# Patient Record
Sex: Female | Born: 1945 | Race: White | Hispanic: No | Smoking: Former smoker
Health system: Southern US, Community
[De-identification: ages and names within clinical notes are randomized; demographics above are authoritative.]

## PROBLEM LIST (undated history)

## (undated) DIAGNOSIS — J189 Pneumonia, unspecified organism: Secondary | ICD-10-CM

## (undated) DIAGNOSIS — I10 Essential (primary) hypertension: Secondary | ICD-10-CM

## (undated) DIAGNOSIS — E119 Type 2 diabetes mellitus without complications: Secondary | ICD-10-CM

## (undated) DIAGNOSIS — J449 Chronic obstructive pulmonary disease, unspecified: Secondary | ICD-10-CM

## (undated) DIAGNOSIS — I2510 Atherosclerotic heart disease of native coronary artery without angina pectoris: Secondary | ICD-10-CM

## (undated) DIAGNOSIS — Z9981 Dependence on supplemental oxygen: Secondary | ICD-10-CM

## (undated) DIAGNOSIS — Z9289 Personal history of other medical treatment: Secondary | ICD-10-CM

## (undated) DIAGNOSIS — E7800 Pure hypercholesterolemia, unspecified: Secondary | ICD-10-CM

## (undated) DIAGNOSIS — D649 Anemia, unspecified: Secondary | ICD-10-CM

## (undated) DIAGNOSIS — R0602 Shortness of breath: Secondary | ICD-10-CM

## (undated) DIAGNOSIS — I219 Acute myocardial infarction, unspecified: Secondary | ICD-10-CM

## (undated) HISTORY — PX: SHOULDER ARTHROSCOPY W/ ROTATOR CUFF REPAIR: SHX2400

## (undated) HISTORY — PX: TONSILLECTOMY: SUR1361

---

## 1968-10-06 HISTORY — PX: ABDOMINAL HYSTERECTOMY: SHX81

## 1997-10-04 ENCOUNTER — Emergency Department (HOSPITAL_COMMUNITY): Admission: EM | Admit: 1997-10-04 | Discharge: 1997-10-04 | Payer: Self-pay | Admitting: Emergency Medicine

## 1998-02-18 ENCOUNTER — Encounter: Payer: Self-pay | Admitting: Cardiology

## 1998-02-18 ENCOUNTER — Ambulatory Visit (HOSPITAL_COMMUNITY): Admission: RE | Admit: 1998-02-18 | Discharge: 1998-02-18 | Payer: Self-pay | Admitting: Cardiology

## 1999-10-25 ENCOUNTER — Other Ambulatory Visit: Admission: RE | Admit: 1999-10-25 | Discharge: 1999-10-25 | Payer: Self-pay | Admitting: *Deleted

## 2002-03-12 ENCOUNTER — Encounter: Admission: RE | Admit: 2002-03-12 | Discharge: 2002-03-12 | Payer: Self-pay

## 2003-03-15 ENCOUNTER — Encounter (INDEPENDENT_AMBULATORY_CARE_PROVIDER_SITE_OTHER): Payer: Self-pay | Admitting: Specialist

## 2003-03-15 ENCOUNTER — Ambulatory Visit (HOSPITAL_COMMUNITY): Admission: RE | Admit: 2003-03-15 | Discharge: 2003-03-15 | Payer: Self-pay | Admitting: Gastroenterology

## 2003-05-31 ENCOUNTER — Ambulatory Visit (HOSPITAL_COMMUNITY): Admission: RE | Admit: 2003-05-31 | Discharge: 2003-05-31 | Payer: Self-pay | Admitting: Gastroenterology

## 2003-05-31 ENCOUNTER — Encounter (INDEPENDENT_AMBULATORY_CARE_PROVIDER_SITE_OTHER): Payer: Self-pay | Admitting: Specialist

## 2007-10-07 ENCOUNTER — Ambulatory Visit (HOSPITAL_COMMUNITY): Admission: RE | Admit: 2007-10-07 | Discharge: 2007-10-07 | Payer: Self-pay | Admitting: Emergency Medicine

## 2007-12-07 ENCOUNTER — Inpatient Hospital Stay (HOSPITAL_COMMUNITY): Admission: EM | Admit: 2007-12-07 | Discharge: 2007-12-09 | Payer: Self-pay | Admitting: Emergency Medicine

## 2007-12-07 ENCOUNTER — Ambulatory Visit: Payer: Self-pay | Admitting: Family Medicine

## 2007-12-07 HISTORY — PX: CORONARY ANGIOPLASTY WITH STENT PLACEMENT: SHX49

## 2008-01-16 ENCOUNTER — Encounter (INDEPENDENT_AMBULATORY_CARE_PROVIDER_SITE_OTHER): Payer: Self-pay | Admitting: Cardiovascular Disease

## 2008-01-16 ENCOUNTER — Ambulatory Visit (HOSPITAL_COMMUNITY): Admission: RE | Admit: 2008-01-16 | Discharge: 2008-01-16 | Payer: Self-pay | Admitting: Cardiovascular Disease

## 2010-02-26 ENCOUNTER — Encounter: Payer: Self-pay | Admitting: Neurosurgery

## 2010-06-20 NOTE — H&P (Signed)
Judith Boyer, Judith Boyer NO.:  [id number]   MEDICAL RECORD NO.:  [id number]          PATIENT TYPE:  EMS   LOCATION:  MAJO                         FACILITY:  [facility]   PHYSICIAN:  Santiago Bumpers. Hensel, M.D.DATE OF BIRTH:  [birth year]   DATE OF ADMISSION:  12/06/2007  DATE OF DISCHARGE:                              HISTORY & PHYSICAL   PRIMARY CARE PHYSICIAN:  Brett Canales A. Cleta Alberts, MD, of [place].   CHIEF COMPLAINT:  Chest pain with left arm pain.   HISTORY OF PRESENT ILLNESS:  This is a 65 year old white female with  history of hypertension and tobacco dependence and she comes in  complaining of left arm pain that is intermittent and occasional chest  cramping all brought on with activity.  She has no numbness or tingling  of the left arm.  She has occasional nausea, dyspnea, and near-syncopal  episode associated with the left arm pain and tingling.  She reports  having a normal Cardiolite stress test 9 years ago.  Does not have a  regular cardiologist at this time.   PAST MEDICAL HISTORY:  Significant for:  1. COPD.  2. Hypertension.  3. A Cardiolite in 2000, which was normal.   PAST SURGICAL HISTORY:  She has had a hysterectomy, tonsillectomy, and a  left shoulder arthroscopic surgery 14 years ago.   MEDICATIONS:  1. Hydrochlorothiazide 12.5 mg p.o. daily.  2. Advair 500/50 b.i.d.  3. Combivent p.r.n.   ALLERGIES:  DOXYCYCLINE, which her allergy is anaphylaxis and CODEINE,  which causes insomnia.   FAMILY HISTORY:  No significance for coronary artery disease.  No MI.  She did have a brother that died of brain cancer.   SOCIAL HISTORY:  She lives with her mom.  She is a Public librarian.  She smokes one-half pack per day for 30 years, which gives her a 15-pack-  year history of smoking.  She has no alcohol, no drugs, and denies any  exercise.   REVIEW OF SYSTEMS:  Please see HPI above.   PHYSICAL EXAMINATION:  VITAL SIGNS:  Temperature 98.1, blood pressure  170/71, her pulse was 93, her respirations are 18, and her oxygen was  98% on room air.  GENERAL:  She was well appearing, she had no acute distress.  She was  awake and alert x3, awake and oriented x3.  HEENT:  She has partial dentures.  Moist mucous membranes.  No JVD  and  no carotid bruits.  CARDIOVASCULAR:  She has a regular rate and rhythm.  No murmurs, rubs,  or gallops.  She has a distinct S1 and S2.  RESPIRATORY:  She is clear to auscultation bilaterally.  ABDOMEN:  Soft.  She is obese, nontender, nondistended, and has positive  bowel sounds.  EXTREMITIES:  No edema and 2+ dorsalis pedis pulses.  NEUROLOGIC:  Cranial nerves II through XII are grossly intact.  She is  5/5 in all extremities.   LABORATORY DATA:  She had a white blood cell count of 8.3, a hemoglobin  of 13.8, and platelets of 211.  Her point-of-care cardiac enzymes were  negative.  She had a sodium of  133, potassium of 3.7, chloride 101,  bicarb 27, BUN 18, and creatinine of 0.63.  Her blood glucose level was  308.  Her chest x-ray showed chronic appearing lung changes with no  infiltrate.  Her EKG showed normal sinus rhythm with no ST changes and  no T-wave changes.   ASSESSMENT AND PLAN:  This is a 65 year old white female with atypical  chest pain, admitted for rule out cardiac etiology.  1. Atypical chest pain.  The patient has 2 risk factors, first tobacco      and hypertension.  Therefore, we will admit to rule out and assess      her and to risk stratify by the patient.  EKG and point-of-care      enzymes are reassuring.  We will cycle the cardiac enzymes x3.      Repeat an EKG in the morning.  Check hemoglobin A1c and a fasting      lipid panel in the morning.  Provide smoking cessation counseling.      We will admit to telemetry floor and will likely need outpatient      Cardiolite test.  We will start on aspirin.  We will provide      morphine, oxygen, and nitroglycerin p.r.n.  2. She has an  increased blood glucose level.  The patient is assessed      for diabetes.  She needs a criteria for new diagnosis of diabetes.      We will benefit from starting that appointment during this      hospitalization.  3. Hypertension.  The patient's blood pressure will be controlled on      her home medication of hydrochlorothiazide of 12.5 mg p.o. daily.  4. Fluid, electrolytes, and nutrition.  The patient will be kept on a      heart-healthy diet.  5. Disposition.  This patient was admitted for 23-hour observation.      We will admit the patient to [facility] and a      telemetry bed.       Jamie Brookes, MD  Electronically Signed      Santiago Bumpers. Leveda Anna, M.D.  Electronically Signed    AS/MEDQ  D:  12/07/2007  T:  12/07/2007  Job:  [id number]

## 2010-06-20 NOTE — Discharge Summary (Signed)
[name], Judith Boyer NO.:  [id number]   MEDICAL RECORD NO.:  [id number]          PATIENT TYPE:  INP   LOCATION:  6526                         FACILITY:  [facility]   PHYSICIAN:  Santiago Bumpers. Hensel, M.D.DATE OF BIRTH:  [birth year]   DATE OF ADMISSION:  12/06/2007  DATE OF DISCHARGE:  12/09/2007                               DISCHARGE SUMMARY   PRIMARY CARE PHYSICIAN:  Judith Canales A. Cleta Alberts, MD   CARDIOLOGIST:  She is going to see Dr. Algie Coffer.   DISCHARGE DIAGNOSES:  1. Atypical chest pain with left arm pain status post catheterization      and stenting in right coronary artery.  2. Hypertension, well controlled on blood pressure medications.  3. Chronic obstructive pulmonary disease.  4. New diagnosis of diabetes type 2.  5. New diagnosis of hyperlipidemia.   DISCHARGE MEDICATIONS:  1. Advair 500/50 two puffs twice a day.  2. Combivent as needed.  3. Aspirin 81 mg by mouth daily.  4. Lisinopril 5 mg p.o. daily.  5. Nitroglycerin 0.4 mg placed under the tongue every 5 minutes up to      3 times as needed for chest pain.  6. Simvastatin 40 mg p.o. daily.  7. Metformin 500 mg p.o. b.i.d.  8. Plavix 75 mg p.o. daily.   The patient is instructed to not take the hydrochlorothiazide at this  time.  This will be left up to her PCP to restart this medication at the  time of her followup visit if he so wishes.   CONSULTATIONS:  Cardiology was consulted during this hospitalization.  Dr. Algie Coffer was consulted on December 08, 2007 and Dr. Sharyn Lull is the  physician who placed the stenting.   PROCEDURES:  On December 06, 2007, the patient had a chest x-ray done  that showed chronic-appearing lung changes, no infiltrates, edema, or  effusions.  The patient had a cardiac catheterization on December 08, 2007 that showed proximal 85% RCA stenosis and 50-60% mid stenosis with  haziness.  A PTCA was done to proximal and mid right coronary artery  using a balloon catheter and stent was  placed in and no lesions.  The  patient tolerated the procedures well.  The patient also had EKGs that  were done on December 06, 2007 and fairly normal ECG on December 07, 2007.  Another EKG was done, which showed no change and showed normal sinus  rhythm, normal ECG, early repolarization pattern, and diffuse ST  elevations, slightly more pronounced since the previous tracing on  December 06, 2007.  On December 08, 2007, she had a another EKG that  showed normal sinus rhythm.   LABORATORY DATA:  At the time of admission, the patient had a CBC done.  Her white blood cell count was 8.3, her hemoglobin 13.8, her hematocrit  was 41.4, and her platelet count was 211.  She also had a BMP that was  done with sodium of 133, potassium of 3.7, chloride 101, bicarb 27, BUN  of 18 and creatinine of 0.63 with glucose of 308.  Her point-of-care  enzymes on December 07, 2007, her troponin I was less  than 0.05.  By 3  hours after that blood draw, her troponin was 0.1 and 8 hours after that  her troponin I had gone to 0.15, and another 9 hours after that her  troponin was 0.22.  Cardiology was consulted as her troponin began to  increase.  She did have a lipid profile that showed cholesterol of 224,  triglycerides of 177, HDL 35, LDL 154, and a hemoglobin Z6X of 11.8.  On  the day of discharge, the patient had a CBC that was 9.4, hemoglobin  14.6, and platelets of 231.  She had also had a BMP with sodium of 139,  potassium of 4.3, chloride 104, bicarb 28, BUN 9, and creatinine 0.64  with a glucose of 193.   BRIEF HOSPITAL COURSE:  1. This is a 65 year old female that presented with a history of      hypertension and tobacco dependence.  She came in complaining of      left arm pain that was intermittent and occasionally some chest      cramping that was brought on with activity.  She had no numbness or      tingling, occasional nausea and dyspnea, near syncopal episode      associated with left arm pain  and tingling.  She had had a normal      Cardiolite stress test 9 years ago and did not see a cardiologist      regularly, so she was brought into the hospital and serial cardiac      enzymes were drawn on her.  The original cardiac enzyme was 0.05 or      less than 0.05, but as the serial cardiac enzyme levels came back,      her troponin did continue to go up.  So on December 07, 2007,      Cardiology was consulted and it was decided that the patient would      receive stenting.  The patient's heparin level was drawn and was      within the acceptable range to do the procedure on December 08, 2007.  The patient had a PT of 13.2 and an INR of 1.0.  The      procedure was done with Dr. Algie Coffer and the stent actually placed      by Dr. Sharyn Lull.  There was a stent placed in the right coronary      artery where the patient had approximately 90% of the vessel      stenosis at the proximal and then at the mid vessel 50% stenosis on      the vessel.  The patient had a normal left ventricular systolic      function with an ejection fraction of 70%.  Their conclusions were      severe right coronary artery disease, mild LAD, and LCX disease and      normal LV systolic function.  The patient did stay overnight after      the procedure.  She tolerated everything well.  She was discharged      on the day after the procedure and was in stable medical condition.  2. Hypertension.  Blood pressures were controlled on      hydrochlorothiazide while the patient was in the hospital.  This      was her home medication, however, we decided before her discharge      that she would be put on lisinopril 5 mg p.o. daily  and that      hydrochlorothiazide would be stopped.  This is due to the      protective effects of lisinopril and ACE inhibitors on the kidneys      in diabetics.  Her blood pressures remained stable during this      hospitalization.  3. COPD.  The patient is to continue her home meds of  Advair and      Combivent.  She had no difficulty breathing during this      hospitalization.  4. New diabetes diagnosis.  The patient came in with a blood glucose      of over 300.  This is enough to make the diagnosis of diabetes type      2.  The patient was continued on a sliding scale of NovoLog insulin      while she was in the hospital and was sent home with a prescription      for metformin 500 mg b.i.d.  5. New hyperlipidemia.  The patient came in with no known diagnosis of      hyperlipidemia and her cholesterol levels were drawn.  See above      for cholesterol levels.  They were high and the patient was put on      a statin.  She was sent home with a prescription for simvastatin 40      mg p.o. daily.  On December 09, 2007, the patient was stable and      ready for discharge.  Her medications were optimized.  See above      list for medications that the patient was sent home on.  She will      have a followup appointment with Dr. Ellis Parents partner, Dr. Perrin Maltese on      December 15, 2007 at 11:45 a.m.  She will also have a followup      appointment with Dr. Algie Coffer, the cardiologist on January 06, 2008      at 2 o'clock p.m.   DISCHARGE INSTRUCTIONS:  The patient was sent home with discharge  instructions.  She was advised to quit smoking and was given a sheet  with information as to reasons why she should come back including chest  pain, shortness of breath, dizziness with sweating or excessive  weakness.   DISCHARGE CONDITION:  The patient was discharged home in a stable  medical condition.      Jamie Brookes, MD  Electronically Signed      Santiago Bumpers. Leveda Anna, M.D.  Electronically Signed    AS/MEDQ  D:  12/09/2007  T:  12/10/2007  Job:  [id number]   cc:   Judith Boyer, M.D.  Ricki Rodriguez, M.D.

## 2010-06-20 NOTE — Cardiovascular Report (Signed)
NAMENATESHA, Judith Boyer NO.:  [id number]   MEDICAL RECORD NO.:  [id number]          PATIENT TYPE:  INP   LOCATION:  6526                         FACILITY:  [facility]   PHYSICIAN:  Mohan N. Sharyn Lull, M.D. DATE OF BIRTH:  1945-09-24   DATE OF PROCEDURE:  12/08/2007  DATE OF DISCHARGE:                            CARDIAC CATHETERIZATION   DATE. OF PROCEDURE:  December 08, 2007.   PROCEDURES:  1. Successful PTCA to proximal and mid RCA using 2.5 x 12 mm x 8 mm      long Voyager balloon.  2. Successful deployment of 3.0 x 33 mm long Cypher regulating stent      in mid and proximal RCA.  3. Successful postdilatation of Cypher drug-eluting stent using 3.5 x      15 mm long [place] Voyager balloon.   INDICATIONS FOR PROCEDURE:  [name] is 65 year old white female with  past medical history significant for hypertension, new onset diabetes  mellitus, tobacco use, and COPD, was admitted on December 06, 2007,  because of recurrent left arm pain and occasional left chest cramping  associated with minimal exertion.  She was admitted to telemetry unit,  also complains of occasional nausea and dyspnea, and near syncopal  episode associated with left arm pain and tingling.  She was admitted to  telemetry unit, MI was ruled out by serial enzymes and EKG.  The patient  subsequently underwent left cardiac cath as per Dr. Algie Coffer and was  noted to have proximal 85% RCA stenosis and 50-60% mid stenosis with  haziness, I was consulted for PCI to proximal and mid RCA.   INDICATIONS FOR PROCEDURE:  A 5-French arterial sheath was replaced with  6-French arterial sheath prior to PCI.  A 6-French left JR4 guiding  catheter with side holes was advanced over the wire under fluoroscopic  guidance up to the ascending aorta.  Wire was pulled out.  The catheter  was aspirated and connected to the manifold.  The catheter was further  advanced sending agent to right coronary ostium.  A single view of  RCA  was obtained.   FINDINGS:  As above.   INTERVENTIONAL PROCEDURE:  Successful PTCA to proximal and mid RCA was  done using 2.5 x 8 mm long Voyager balloon for predilatation and then  3.0 x 33 mm long Cypher drug-eluting stent was deployed in mid and  proximal RCA.  This stent was postdilated using 3.5 x 15 mm long [place]  Voyager balloon going up to 18 and 20 atmospheric pressure.  Lesion was  dilated from 60 and 85% to less than 10% residual with excellent TIMI  grade 3  distal flow without evidence of dissection or distal embolization.  The  patient received weight-based Angiomax and 600 mg of Plavix during the  procedure.  The patient tolerated the procedure well.  There were no  complications.  The patient was transferred to recovery room in stable  condition.      Eduardo Osier. Sharyn Lull, M.D.  Electronically Signed     MNH/MEDQ  D:  12/08/2007  T:  12/09/2007  Job:  [id number]  cc:   Ricki Rodriguez, M.D.  William A. Leveda Anna, M.D.

## 2010-06-20 NOTE — Discharge Summary (Signed)
NAMEDEVEN, AUDI NO.:  [id number]   MEDICAL RECORD NO.:  [id number]         PATIENT TYPE:  CINP   LOCATION:                               FACILITY:  [facility]   PHYSICIAN:  Santiago Bumpers. Hensel, M.D.DATE OF BIRTH:  04-17-1945   DATE OF ADMISSION:  12/07/2007  DATE OF DISCHARGE:  12/07/2007                               DISCHARGE SUMMARY   PRIMARY CARE PHYSICIAN:  Dr. Cleta Alberts in [place].   DISCHARGE DIAGNOSES:  1. Atypical chest pain/left arm pain.  2. Hypertension.  3. Chronic obstructive pulmonary disease.  4. New diagnosis of diabetes type 2.  5. New diagnosis of hyperlipidemia.   DISCHARGE MEDICATIONS:  1. Hydrochlorothiazide 12.5 mg p.o. daily.  2. Advair 500/50 two puffs b.i.d.  3. Combivent p.r.n.  4. Nitroglycerin 0.4 mg sublingual q.5 minutes x3 p.r.n. for chest      pain.  5. Aspirin 81 mg p.o. daily.  6. Lisinopril 5 mg p.o. daily.   ADMISSION LABORATORY DATA:  Upon admission, CBC was within normal  limits.  Point-of-care enzymes were negative.  EKG showed normal sinus  rhythm.  Chest x-ray showed chronic-appearing lung changes.  No  infiltrates, edema, or effusions.  Sodium was 133, potassium 3.7,  chloride 101, CO2 27, BUN 18, creatinine 0.63, and glucose of 308.  CBG  at 6:00 a.m. was 235 that was fasting.  Total cholesterol 224,  triglycerides 177, HDL 35, and LDL of 154.  Hemoglobin A1c is pending.  First set of cardiac enzymes troponin was 0.1, CK-MB of 2.7.  See  addendum for remainder of cardiac enzymes.  Repeat EKG showed normal  sinus rhythm.   BRIEF HOSPITAL COURSE:  This is a 62-year white female with a  presentation of atypical chest pain and left arm pain.  She was admitted  to [facility], placed on telemetry and  ruled out for MI.  Her labs were as stated above.  Point-of-care enzymes  were negative.  EKG was also negative.  Chest x-ray as stated above.  Her cardiac enzymes were cycled.  First  troponin was 0.1.  EKG performed  at that time showed normal sinus rhythm with no ST changes or T-wave  inversion.  The patient never complained of further symptoms of chest  discomfort or left arm pain.  The patient was risk stratified and was  found to have new diagnosis of diabetes with a random glucose of 308 and  a fasting glucose of 235.  The patient was educated about her diagnosis  and was instructed that she would have to undergo some lifestyle status  changes with her diet and exercise.  The patient was also instructed  that she would most likely have to be started on oral agents such as  metformin.  However, we will defer the management of this to Dr. Cleta Alberts,  as he may know the patient better and may want to start her on a  different medication per preference.  Hemoglobin A1c was obtained and is  pending at this time.  The patient was also risk stratified for  cholesterol and was found to  be hyperlipidemic.  As stated above, we did  not start a statin at this time.  We will defer to Dr. Cleta Alberts, as he may  want to choose which statin he may use.  I recommend aggressive therapy  given her risk factors.  I recommend rosuvastatin, as this is the most  potent of the statins.  The patient was educated about her risk.  She  was instructed to quit smoking.  She states that she smokes half-a-pack  per day for 30 years.  The patient was started on lisinopril during her  hospital stay for hypertensive reasons and also for kidney protection  for her new diagnosis of diabetes.  We recommend that she have a  cardiology consult as an outpatient set up by Dr. Cleta Alberts.  She states that  she had a Cardiolite done 9 years ago that was normal.  I recommend a  repeat Cardiolite or Myoview per the cardiologist's recommendations.  Given the fact that we started her on lisinopril during her hospital  stay, recommend rechecking a creatinine in 2-3 weeks.  Her creatinine  upon discharge was 0.63.  The patient  is in stable condition and desires  to go home and be worked up as an outpatient.   FOLLOWUP APPOINTMENTS:  The patient was instructed to call Dr. Cleta Alberts on  Monday following the day of discharge.  She seems reliable and is wanted  to do so.      Marisue Ivan, MD  Electronically Signed      Santiago Bumpers. Leveda Anna, M.D.  Electronically Signed    KL/MEDQ  D:  12/07/2007  T:  12/08/2007  Job:  [id number]   cc:   Dr. Cleta Alberts in [place]

## 2010-06-23 NOTE — Op Note (Signed)
Judith Boyer, Judith Boyer                     ACCOUNT NO.:  [id number]   MEDICAL RECORD NO.:  [id number]                   PATIENT TYPE:  AMB   LOCATION:  ENDO                                 [facility]ACILITY[facility]   PHYSICIAN:  John C. Madilyn Fireman, M.D.                 DATE OF BIRTH:  August 13, 1945   DATE OF PROCEDURE:  05/31/2003  DATE OF DISCHARGE:                                 OPERATIVE REPORT   PROCEDURE:  Colonoscopy.   INDICATION FOR PROCEDURE:  History of colon polyps removed with poor prep  and a probable incomplete removal of one ascending colon polyp approximately  2 months ago.   DESCRIPTION OF PROCEDURE:  The patient was placed in the left lateral  decubitus position and placed on the pulse monitor with continuous low-flow  oxygen delivered by nasal cannula.  She was sedated with 75 mcg IV fentanyl  and 7 mg IV Versed.  The Olympus video colonoscope was inserted into the  rectum and advanced to the cecum, confirmed by transillumination at  McBurney's point and visualization of the ileocecal valve and appendiceal  orifice.  The prep was excellent.  The cecum appeared normal with no masses,  polyps, diverticula, or other mucosal abnormalities.  Within the ascending  colon, there was a 1.2 cm polyp that was removed by snare.  The remainder of  the ascending, transverse, descending, and sigmoid colon appeared normal  with no further polyps, masses, diverticula, or other mucosal abnormalities.  Within the rectum, there were 3 less than 1 cm polyps ranging in size from 5  mm to 10 mm, and these were fulgurated by hot biopsy and sent in the same  specimen container with the ascending colon polyp.  The scope was then  withdrawn and the patient returned to the recovery room in stable condition.  She tolerated the procedure well, and there were no immediate complications.   IMPRESSION:  Ascending and rectal polyps.   PLAN:  Await histology to determine method and interval for future colon  screening.                                               John C. Madilyn Fireman, M.D.    [facility]/MEDQ  D:  05/31/2003  T:  05/31/2003  Job:  [id number]   cc:   Christella Noa, M.D.  [address] [place]  [place], [place] [address]  Fax: [phone]

## 2010-06-23 NOTE — Op Note (Signed)
Judith Boyer, Judith Boyer                     ACCOUNT NO.:  [id number]   MEDICAL RECORD NO.:  [id number]                   PATIENT TYPE:  AMB   LOCATION:  ENDO                                 FACILITY:  [facility]   PHYSICIAN:  John C. Madilyn Fireman, M.D.                 DATE OF BIRTH:  Aug 30, 1945   DATE OF PROCEDURE:  03/15/2003  DATE OF DISCHARGE:                                 OPERATIVE REPORT   PROCEDURE:  Colonoscopy with polypectomy.   INDICATIONS FOR PROCEDURE:  Multiple colon polyps in 1998, recommended for  repeat colonoscopy in two years but she was noncompliant and did not present  for a colonoscopy until now.   DESCRIPTION OF PROCEDURE:  The patient was placed in the left lateral  decubitus position and placed on the pulse monitor with continuous low-flow  oxygen delivered by nasal cannula.  She was sedated with 75 mcg IV fentanyl  and 8 mg IV Versed.  The Olympus video colonoscope was inserted into the  rectum and advanced to the cecum with considerable difficulty.  The prep was  suboptimal with significant film of brown stool on the walls of the colon in  several places.  They could  not be adequately lavaged to allow ideal  visualization and the prep was overall suboptimal.  The cecum appeared  normal with no masses, polyps or diverticula.  In the ascending colon there  were two polyps that were somewhat sessile, fairly large - on estimated  approximately 1.5 cm, the other one approximately 2-2.25 cm in diameter.  These were very difficult to ensnare for multiple reasons including  breathing artifact, difficulty in maintaining position and the location on  the edge of folds behind other folds making it difficult for me to maneuver  the snare into position to adequate ensnare them.  The smaller of the two  polyps was felt to be essentially ensnared in one piece, but there may have  been some residual polyp left behind at the base.  The second polyp was more  difficult and I was  able to ensnare two fragments of this polyp but could  never manipulate the snare into place to resnare the remaining base and  ultimately I aborted attempts to accomplish this.  The patient was also  somewhat difficult to keep sedated and was uncomfortable during this aspect  of the procedure.  I elected to leave these polyps alone and felt that she  would probably need a repeat colonoscopy in a few months anyway to reinspect  the bases presuming the removed polyps did not have invasive malignancy.  The transverse and descending colon appeared normal with no further polyps,  masses or diverticula although I could not rule out small polyps due to the  inadequate prep.  In the rectosigmoid area, there was one polyp  approximately 8 mm in diameter that was fulgurated by snare.  The remainder  of the sigmoid and rectum  appeared normal.  The scope was then withdrawn and  the patient returned to the recovery room in stable condition.  She  tolerated the procedure well and there were no immediate complications.   IMPRESSION:  1. Moderate to large ascending colon polyps, difficult to remove, with     possible remaining residual polyp.  2. Sigmoid polyp.  3. Could not rule out other polyps given the difficulty with this procedure     and poor preparation.   PLAN:  Will await histology.  Unless invasive malignancy warrants surgery,  will repeat colonoscopy in two months with a more vigorous prep and for  reinspection of the polypectomy sites.                                               John C. Madilyn Fireman, M.D.   JCH/MEDQ  D:  03/15/2003  T:  03/15/2003  Job:  [id number]

## 2010-11-07 LAB — CARDIAC PANEL(CRET KIN+CKTOT+MB+TROPI)
CK, MB: 3.3
Relative Index: INVALID
Relative Index: INVALID
Total CK: 57
Troponin I: 0.15 — ABNORMAL HIGH

## 2010-11-07 LAB — GLUCOSE, CAPILLARY
Glucose-Capillary: 163 — ABNORMAL HIGH
Glucose-Capillary: 270 — ABNORMAL HIGH
Glucose-Capillary: 293 — ABNORMAL HIGH
Glucose-Capillary: 295 — ABNORMAL HIGH

## 2010-11-07 LAB — BASIC METABOLIC PANEL
BUN: 9
Calcium: 9.1
Chloride: 101
Chloride: 104
Creatinine, Ser: 0.63
GFR calc Af Amer: >60
GFR calc non Af Amer: >60
Glucose, Bld: 193 — ABNORMAL HIGH
Potassium: 4.3

## 2010-11-07 LAB — CBC
HCT: 40.9
HCT: 43.1
Hemoglobin: 14.6
MCV: 89
MCV: 88.9
Platelets: 211
Platelets: 209
RBC: 4.65
RBC: 4.6
RDW: 12.6
WBC: 8.3
WBC: 8
WBC: 9.4

## 2010-11-07 LAB — POCT CARDIAC MARKERS
CKMB, poc: 1
Myoglobin, poc: 52.1
Troponin i, poc: <0.05

## 2010-11-07 LAB — LIPID PANEL
Cholesterol: 224 — ABNORMAL HIGH
HDL: 35 — ABNORMAL LOW
LDL Cholesterol: 154 — ABNORMAL HIGH
Total CHOL/HDL Ratio: 6.4
Triglycerides: 177 — ABNORMAL HIGH

## 2010-11-07 LAB — CK TOTAL AND CKMB (NOT AT ARMC)
CK, MB: 2.7
Relative Index: INVALID
Total CK: 54

## 2010-11-07 LAB — DIFFERENTIAL
Lymphocytes Relative: 37
Lymphs Abs: 3
Monocytes Relative: 5
Neutro Abs: 4.7
Neutrophils Relative %: 57

## 2010-11-07 LAB — HEPARIN LEVEL (UNFRACTIONATED): Heparin Unfractionated: <0.1 — ABNORMAL LOW

## 2010-11-07 LAB — PROTIME-INR: INR: 1

## 2010-11-07 LAB — TROPONIN I: Troponin I: 0.1 — ABNORMAL HIGH

## 2010-11-07 LAB — HEMOGLOBIN A1C: Mean Plasma Glucose: 292

## 2010-11-10 LAB — BASIC METABOLIC PANEL
CO2: 25 mEq/L (ref 19–32)
Glucose, Bld: 109 mg/dL — ABNORMAL HIGH (ref 70–99)
Potassium: 4.2 mEq/L (ref 3.5–5.1)
Sodium: 139 mEq/L (ref 135–145)

## 2010-11-10 LAB — PROTIME-INR: Prothrombin Time: 12.9 seconds (ref 11.6–15.2)

## 2010-11-10 LAB — DIFFERENTIAL
Basophils Absolute: 0 10*3/uL (ref 0.0–0.1)
Basophils Relative: 0 % (ref 0–1)
Eosinophils Relative: 0 % (ref 0–5)
Lymphocytes Relative: 30 % (ref 12–46)
Monocytes Absolute: 0.5 10*3/uL (ref 0.1–1.0)

## 2010-11-10 LAB — CBC
HCT: 40.8 % (ref 36.0–46.0)
Hemoglobin: 13.6 g/dL (ref 12.0–15.0)
MCHC: 33.5 g/dL (ref 30.0–36.0)
MCV: 89.2 fL (ref 78.0–100.0)
RDW: 12.7 % (ref 11.5–15.5)

## 2011-02-03 ENCOUNTER — Ambulatory Visit (INDEPENDENT_AMBULATORY_CARE_PROVIDER_SITE_OTHER): Payer: Medicare Other

## 2011-02-03 DIAGNOSIS — R0902 Hypoxemia: Secondary | ICD-10-CM

## 2011-02-03 DIAGNOSIS — J449 Chronic obstructive pulmonary disease, unspecified: Secondary | ICD-10-CM

## 2011-02-03 DIAGNOSIS — J159 Unspecified bacterial pneumonia: Secondary | ICD-10-CM

## 2011-02-04 ENCOUNTER — Inpatient Hospital Stay (HOSPITAL_COMMUNITY)
Admission: EM | Admit: 2011-02-04 | Discharge: 2011-02-09 | DRG: 192 | Disposition: A | Discharge status: Home / Self Care | Payer: Medicare Other | Attending: Family Medicine | Admitting: Family Medicine

## 2011-02-04 ENCOUNTER — Emergency Department (HOSPITAL_COMMUNITY): Payer: Medicare Other

## 2011-02-04 ENCOUNTER — Other Ambulatory Visit: Payer: Self-pay

## 2011-02-04 ENCOUNTER — Encounter: Payer: Self-pay | Admitting: Physical Medicine and Rehabilitation

## 2011-02-04 DIAGNOSIS — R0789 Other chest pain: Secondary | ICD-10-CM

## 2011-02-04 DIAGNOSIS — I2510 Atherosclerotic heart disease of native coronary artery without angina pectoris: Secondary | ICD-10-CM | POA: Diagnosis present

## 2011-02-04 DIAGNOSIS — J449 Chronic obstructive pulmonary disease, unspecified: Secondary | ICD-10-CM | POA: Diagnosis present

## 2011-02-04 DIAGNOSIS — J441 Chronic obstructive pulmonary disease with (acute) exacerbation: Secondary | ICD-10-CM

## 2011-02-04 DIAGNOSIS — F17200 Nicotine dependence, unspecified, uncomplicated: Secondary | ICD-10-CM | POA: Diagnosis present

## 2011-02-04 DIAGNOSIS — Z7902 Long term (current) use of antithrombotics/antiplatelets: Secondary | ICD-10-CM

## 2011-02-04 DIAGNOSIS — Z9861 Coronary angioplasty status: Secondary | ICD-10-CM

## 2011-02-04 DIAGNOSIS — Z888 Allergy status to other drugs, medicaments and biological substances status: Secondary | ICD-10-CM

## 2011-02-04 DIAGNOSIS — I10 Essential (primary) hypertension: Secondary | ICD-10-CM | POA: Diagnosis present

## 2011-02-04 DIAGNOSIS — E119 Type 2 diabetes mellitus without complications: Secondary | ICD-10-CM | POA: Diagnosis present

## 2011-02-04 DIAGNOSIS — Z886 Allergy status to analgesic agent status: Secondary | ICD-10-CM

## 2011-02-04 DIAGNOSIS — J329 Chronic sinusitis, unspecified: Secondary | ICD-10-CM | POA: Diagnosis present

## 2011-02-04 DIAGNOSIS — R079 Chest pain, unspecified: Secondary | ICD-10-CM | POA: Diagnosis present

## 2011-02-04 DIAGNOSIS — I252 Old myocardial infarction: Secondary | ICD-10-CM

## 2011-02-04 HISTORY — DX: Chronic obstructive pulmonary disease, unspecified: J44.9

## 2011-02-04 HISTORY — DX: Essential (primary) hypertension: I10

## 2011-02-04 HISTORY — DX: Acute myocardial infarction, unspecified: I21.9

## 2011-02-04 LAB — POCT I-STAT 3, ART BLOOD GAS (G3+)
Acid-base deficit: 1 mmol/L (ref 0.0–2.0)
Bicarbonate: 26.4 mEq/L — ABNORMAL HIGH (ref 20.0–24.0)
TCO2: 28 mmol/L (ref 0–100)
pCO2 arterial: 52.1 mmHg — ABNORMAL HIGH (ref 35.0–45.0)
pH, Arterial: 7.313 — ABNORMAL LOW (ref 7.350–7.400)

## 2011-02-04 LAB — CBC
HCT: 36.6 % (ref 36.0–46.0)
Hemoglobin: 12 g/dL (ref 12.0–15.0)
MCHC: 32.8 g/dL (ref 30.0–36.0)
MCV: 86.5 fL (ref 78.0–100.0)
RDW: 13.7 % (ref 11.5–15.5)

## 2011-02-04 LAB — POCT I-STAT TROPONIN I: Troponin i, poc: 0.01 ng/mL (ref 0.00–0.08)

## 2011-02-04 LAB — PRO B NATRIURETIC PEPTIDE: Pro B Natriuretic peptide (BNP): 172.2 pg/mL — ABNORMAL HIGH (ref 0–125)

## 2011-02-04 LAB — COMPREHENSIVE METABOLIC PANEL
Alkaline Phosphatase: 83 U/L (ref 39–117)
BUN: 16 mg/dL (ref 6–23)
Creatinine, Ser: 0.49 mg/dL — ABNORMAL LOW (ref 0.50–1.10)
GFR calc Af Amer: >90 mL/min (ref >90)
Glucose, Bld: 142 mg/dL — ABNORMAL HIGH (ref 70–99)
Potassium: 3.9 mEq/L (ref 3.5–5.1)
Total Bilirubin: 0.1 mg/dL — ABNORMAL LOW (ref 0.3–1.2)
Total Protein: 6.5 g/dL (ref 6.0–8.3)

## 2011-02-04 LAB — GLUCOSE, CAPILLARY: Glucose-Capillary: 305 mg/dL — ABNORMAL HIGH (ref 70–99)

## 2011-02-04 LAB — CARDIAC PANEL(CRET KIN+CKTOT+MB+TROPI): Total CK: 138 U/L (ref 7–177)

## 2011-02-04 MED ORDER — ALBUTEROL SULFATE (5 MG/ML) 0.5% IN NEBU
5.0000 mg | INHALATION_SOLUTION | Freq: Once | RESPIRATORY_TRACT | Status: AC
  Administered 2011-02-04: 5 mg via RESPIRATORY_TRACT
  Filled 2011-02-04: qty 1

## 2011-02-04 MED ORDER — LORAZEPAM 1 MG PO TABS
0.5000 mg | ORAL_TABLET | Freq: Once | ORAL | Status: AC
  Administered 2011-02-04: 0.5 mg via ORAL
  Filled 2011-02-04: qty 1

## 2011-02-04 MED ORDER — ALBUTEROL SULFATE (5 MG/ML) 0.5% IN NEBU
2.5000 mg | INHALATION_SOLUTION | RESPIRATORY_TRACT | Status: DC | PRN
  Administered 2011-02-04 – 2011-02-05 (×4): 2.5 mg via RESPIRATORY_TRACT
  Filled 2011-02-04 (×3): qty 0.5

## 2011-02-04 MED ORDER — PREDNISONE 50 MG PO TABS
50.0000 mg | ORAL_TABLET | Freq: Every day | ORAL | Status: DC
  Administered 2011-02-05 – 2011-02-09 (×5): 50 mg via ORAL
  Filled 2011-02-04 (×6): qty 1

## 2011-02-04 MED ORDER — LOSARTAN POTASSIUM 50 MG PO TABS
50.0000 mg | ORAL_TABLET | Freq: Every day | ORAL | Status: DC
  Administered 2011-02-04 – 2011-02-09 (×6): 50 mg via ORAL
  Filled 2011-02-04 (×6): qty 1

## 2011-02-04 MED ORDER — IPRATROPIUM BROMIDE 0.02 % IN SOLN
0.5000 mg | Freq: Four times a day (QID) | RESPIRATORY_TRACT | Status: DC
  Administered 2011-02-04 – 2011-02-09 (×19): 0.5 mg via RESPIRATORY_TRACT
  Filled 2011-02-04 (×21): qty 2.5

## 2011-02-04 MED ORDER — ALBUTEROL SULFATE (5 MG/ML) 0.5% IN NEBU
2.5000 mg | INHALATION_SOLUTION | RESPIRATORY_TRACT | Status: DC
  Administered 2011-02-04 – 2011-02-05 (×2): 2.5 mg via RESPIRATORY_TRACT
  Filled 2011-02-04 (×3): qty 0.5

## 2011-02-04 MED ORDER — IOHEXOL 300 MG/ML  SOLN: 100.0000 mL | Freq: Once | INTRAMUSCULAR | Status: AC | PRN

## 2011-02-04 MED ORDER — CLOPIDOGREL BISULFATE 75 MG PO TABS
75.0000 mg | ORAL_TABLET | Freq: Every day | ORAL | Status: DC
  Administered 2011-02-04 – 2011-02-09 (×6): 75 mg via ORAL
  Filled 2011-02-04 (×6): qty 1

## 2011-02-04 MED ORDER — SODIUM CHLORIDE 0.9 % IJ SOLN
3.0000 mL | INTRAMUSCULAR | Status: DC | PRN
  Administered 2011-02-07 (×2): 3 mL via INTRAVENOUS

## 2011-02-04 MED ORDER — MONTELUKAST SODIUM 10 MG PO TABS
10.0000 mg | ORAL_TABLET | Freq: Every day | ORAL | Status: DC
  Administered 2011-02-04 – 2011-02-08 (×5): 10 mg via ORAL
  Filled 2011-02-04 (×6): qty 1

## 2011-02-04 MED ORDER — SODIUM CHLORIDE 0.9 % IV BOLUS (SEPSIS)
500.0000 mL | Freq: Once | INTRAVENOUS | Status: AC
  Administered 2011-02-04: 500 mL via INTRAVENOUS

## 2011-02-04 MED ORDER — SODIUM CHLORIDE 0.9 % IV SOLN: 250.0000 mL | INTRAVENOUS | Status: DC | PRN

## 2011-02-04 MED ORDER — HEPARIN SODIUM (PORCINE) 5000 UNIT/ML IJ SOLN
5000.0000 [IU] | Freq: Three times a day (TID) | INTRAMUSCULAR | Status: DC
  Administered 2011-02-04 – 2011-02-09 (×14): 5000 [IU] via SUBCUTANEOUS
  Filled 2011-02-04 (×17): qty 1

## 2011-02-04 MED ORDER — SODIUM CHLORIDE 0.9 % IJ SOLN
3.0000 mL | Freq: Two times a day (BID) | INTRAMUSCULAR | Status: DC
  Administered 2011-02-04 – 2011-02-08 (×8): 3 mL via INTRAVENOUS

## 2011-02-04 MED ORDER — MOXIFLOXACIN HCL 400 MG PO TABS
400.0000 mg | ORAL_TABLET | Freq: Every day | ORAL | Status: DC
  Administered 2011-02-05 – 2011-02-09 (×5): 400 mg via ORAL
  Filled 2011-02-04 (×5): qty 1

## 2011-02-04 MED ORDER — FLUTICASONE-SALMETEROL 500-50 MCG/DOSE IN AEPB
1.0000 | INHALATION_SPRAY | Freq: Two times a day (BID) | RESPIRATORY_TRACT | Status: DC
  Administered 2011-02-04 – 2011-02-08 (×9): 1 via RESPIRATORY_TRACT
  Filled 2011-02-04: qty 14

## 2011-02-04 MED ORDER — IPRATROPIUM BROMIDE 0.02 % IN SOLN
0.5000 mg | Freq: Once | RESPIRATORY_TRACT | Status: AC
  Administered 2011-02-04: 0.5 mg via RESPIRATORY_TRACT
  Filled 2011-02-04: qty 2.5

## 2011-02-04 MED ORDER — INSULIN ASPART 100 UNIT/ML ~~LOC~~ SOLN
0.0000 [IU] | Freq: Three times a day (TID) | SUBCUTANEOUS | Status: DC
  Administered 2011-02-05: 3 [IU] via SUBCUTANEOUS
  Administered 2011-02-05: 5 [IU] via SUBCUTANEOUS
  Administered 2011-02-06: 1 [IU] via SUBCUTANEOUS
  Administered 2011-02-06 (×2): 3 [IU] via SUBCUTANEOUS
  Administered 2011-02-07: 2 [IU] via SUBCUTANEOUS
  Administered 2011-02-07: 5 [IU] via SUBCUTANEOUS
  Administered 2011-02-08: 2 [IU] via SUBCUTANEOUS
  Administered 2011-02-08: 1 [IU] via SUBCUTANEOUS
  Administered 2011-02-08 – 2011-02-09 (×3): 3 [IU] via SUBCUTANEOUS
  Filled 2011-02-04: qty 3

## 2011-02-04 NOTE — ED Notes (Signed)
Pt ambulating in hall on room air, O2 sat 79% with pulse rate 140, pt unable to tolerate activity, pt placed in wheelchair with 4L O2 reapplied, pt O2 sat returned to 93 with pulse rate of 125, pt in room on cardiac monitor 4 L [place]

## 2011-02-04 NOTE — H&P (Signed)
  I interviewed and examined this patient and discussed the care plan with Dr. Aviva Signs and the [facility] team and agree with assessment and plan as documented in the admission note for today.    Diala Waxman A. Sheffield Slider, MD Family Medicine Teaching Service Attending  02/04/2011 9:56 PM

## 2011-02-04 NOTE — ED Notes (Signed)
IV in L hand painful & unable to flush, IV removed

## 2011-02-04 NOTE — ED Notes (Signed)
Respiratory paged to obtain ABG.

## 2011-02-04 NOTE — ED Notes (Signed)
Pt up to bedside commode, became SOB on exertion. Pt assisted back to bed. Work of breathing increased. HR 120bpm. Pt remains on cardiac monitor. Dr. Alto Denver at the bedside.

## 2011-02-04 NOTE — ED Notes (Signed)
Pt presents to department via EMS from home for evaluation of SOB and diffuse chest heaviness. Seen at PCP yesterday and diagnosed with pneumonia. Onset of chest pain today. Pt states "this feels like my last heart attack." received 1 sublingual nitroglycerin and 324 ASA per EMS. No chest pain upon arrival. 20g L hand. She is alert and oriented x4. No signs of distress at the present.

## 2011-02-04 NOTE — ED Provider Notes (Signed)
History     CSN: [id number]  Arrival date & time 02/04/11  1208   First MD Initiated Contact with Patient 02/04/11 1215      Chief Complaint  Patient presents with  . Shortness of Breath  . Chest Pain    (Consider location/radiation/quality/duration/timing/severity/associated sxs/prior treatment) Patient is a 65 y.o. female presenting with chest pain.  Chest Pain Primary symptoms include a fever, fatigue, shortness of breath, cough, wheezing and nausea.    Patient is a 65 year old female who presents today with shortness of breath. Patient has had increasing shortness of breath and cough since December 26. She actually saw her primary care provider Dr. dogs yesterday. At that time she had a chest x-ray and was diagnosed with pneumonia. She was started on Avelox. She was given IV fluids in his office and was on oxygen there for a time. She is normally not on home oxygen. She was felt safe to discharge home. She was also started on prednisone. This morning she has taken her Avelox and her prednisone. She had sudden onset of a substernal chest pain that was severe. This resolved with one dose of nitroglycerin. Patient had had some associated nausea with this. She did receive a full dose of aspirin before arrival by EMS. Patient has history of coronary artery disease with 2 stents placed during her last catheterization in 2009. Dr. Algie Coffer is her cardiologist.  Patient is speaking in 2-3 word bursts and is to keep neck on arrival. She has not been on prednisone in the past 6 months. She has not been admitted to the hospital in the past month. She has no known sick contacts. Patient did have 2 days of fever when her symptoms first began but she has not had one since that time the There are no other associated or modifying factors. Past Medical History  Diagnosis Date  . Myocardial infarction   . COPD (chronic obstructive pulmonary disease)   . Hypertension     History reviewed. No pertinent  past surgical history.  History reviewed. No pertinent family history.  History  Substance Use Topics  . Smoking status: Never Smoker   . Smokeless tobacco: Not on file  . Alcohol Use: No    OB History    Grav Para Term Preterm Abortions TAB SAB Ect Mult Living                  Review of Systems  Constitutional: Positive for fever and fatigue.  HENT: Positive for congestion and sinus pressure.   Respiratory: Positive for cough, chest tightness, shortness of breath and wheezing.   Cardiovascular: Positive for chest pain.  Gastrointestinal: Positive for nausea.  Genitourinary: Negative.   Musculoskeletal: Negative.   Skin: Negative.   Neurological: Negative.   Hematological: Negative.   Psychiatric/Behavioral: Negative.   All other systems reviewed and are negative.    Allergies  Codeine; Doxycycline; and Lisinopril  Home Medications  No current outpatient prescriptions on file.  There were no vitals taken for this visit.  Physical Exam  Nursing note and vitals reviewed. Constitutional: She is oriented to person, place, and time. She appears well-developed and well-nourished.  HENT:  Head: Normocephalic and atraumatic.  Eyes: Conjunctivae and EOM are normal. Pupils are equal, round, and reactive to light.  Neck: Normal range of motion.  Cardiovascular: Regular rhythm, normal heart sounds and intact distal pulses.  Tachycardia present.  Exam reveals no gallop and no friction rub.   No murmur heard. Pulmonary/Chest: Tachypnea noted.  She is in respiratory distress. She has decreased breath sounds. She has wheezes.  Abdominal: Soft. Bowel sounds are normal. She exhibits no distension. There is no tenderness. There is no rebound and no guarding.  Musculoskeletal: Normal range of motion. She exhibits no edema.  Neurological: She is alert and oriented to person, place, and time. No cranial nerve deficit.  Skin: Skin is warm and dry. No rash noted. There is pallor.    Psychiatric: She has a normal mood and affect.    ED Course  Procedures (including critical care time)  Date: 02/04/2011  Rate: 109  Rhythm: sinus tachycardia  QRS Axis: normal  Intervals: normal  ST/T Wave abnormalities: normal  Conduction Disutrbances:none  Narrative Interpretation: prior inferior Q waves noted  Old EKG Reviewed: unchanged  Labs Reviewed  PRO B NATRIURETIC PEPTIDE - Abnormal; Notable for the following:    Pro B Natriuretic peptide (BNP) 172.2 (*)    All other components within normal limits  CBC - Abnormal; Notable for the following:    WBC 10.7 (*)    All other components within normal limits  COMPREHENSIVE METABOLIC PANEL - Abnormal; Notable for the following:    Chloride 95 (*)    Glucose, Bld 142 (*)    Creatinine, Ser 0.49 (*)    Albumin 3.4 (*)    Total Bilirubin 0.1 (*)    All other components within normal limits  POCT I-STAT TROPONIN I  POCT I-STAT TROPONIN I  I-STAT TROPONIN I  I-STAT TROPONIN I   Ct Angio Chest W/cm &/or Wo Cm  02/04/2011  *RADIOLOGY REPORT*  Clinical Data:  Chest pain with shortness of breath.  Evaluate for pulmonary embolism.  History of diabetes, hypertension, COPD and myocardial infarction.  CT ANGIOGRAPHY CHEST WITH CONTRAST  Technique:  Multidetector CT imaging of the chest was performed using the standard protocol during bolus administration of intravenous contrast.  Multiplanar CT image reconstructions including MIPs were obtained to evaluate the vascular anatomy.  Contrast:  100 ml Omnipaque-300 intravenously.  Comparison:  Chest radiographs same date.  No prior CT.  Findings:  The pulmonary arteries are well opacified with contrast. There is no evidence of acute pulmonary embolism.  There is atherosclerosis of the great vessels, aorta and coronary arteries.  Scattered normal-sized hilar and mediastinal lymph nodes are noted. There are no pathologically enlarged lymph nodes.  There is no pleural or pericardial effusion.   Moderate emphysematous changes are present throughout the lungs. No airspace disease, suspicious nodule or endobronchial lesion is identified.  The visualized upper abdomen demonstrates no acute or significant findings.  Review of the MIP images confirms the above findings.  IMPRESSION:  1.  No evidence of acute pulmonary embolism other acute chest process. 2.  Moderate emphysema. 3.  Scattered atherosclerosis.  Original Report Authenticated By: Gerrianne Scale, M.D.   Dg Chest Portable 1 View  02/04/2011  *RADIOLOGY REPORT*  Clinical Data: Shortness of breath, chest pain  PORTABLE CHEST - 1 VIEW  Comparison: 12/06/2007  Findings: Cardiomediastinal silhouette is unremarkable. Stable hyperinflation and chronic mild interstitial prominence.  Stable streaky atelectasis or scarring in the right middle lobe.  No convincing pulmonary edema.  No focal infiltrate.  IMPRESSION:  Stable hyperinflation and chronic mild interstitial prominence. Stable streaky atelectasis or scarring in the right middle lobe. No convincing pulmonary edema.  No focal infiltrate.  Original Report Authenticated By: Natasha Mead, M.D.    Date: 02/04/2011  Rate: 109  Rhythm: sinus tachycardia  QRS Axis: normal  Intervals: normal  ST/T Wave abnormalities: nonspecific T wave changes  Conduction Disutrbances:none  Narrative Interpretation: T-wave flattening seen in aVL previously now appears to be slightly inverted. Patient is tachycardic compared to previous. No significant change.  Old EKG Reviewed: unchanged    1. COPD exacerbation       MDM  Patient is a 65 yo female with history of CAD, COPD, and diagnosis of pneumonia yesterday.  Patient has a hearty been started on Avelox for her symptoms and was begun on prednisone as well. She taken both of these medications this morning. She then became acutely more short of breath and experienced chest pain this morning. This resolved with 1 nitroglycerin tab. Though her chest pain  resolved her shortness of breath continues. She had diminished breath sounds throughout on her exam. Prednisone as ordered as the severity being given. Albuterol and Atrovent nebs were ordered. Patient hearty received aspirin prior to arrival. Cardiac workup was initiated as well given patient's history of CAD with 2 stents in the past.  Patient had 2 negative troponins as well as an unchanged EKG. Her chest x-ray today did not show any signs of pneumonia. Patient did not receive significant relief with albuterol and Atrovent nebulizers here. Patient was given a 500 cc normal saline IV bolus as well as 0.5 mg of Ativan without resolution of her symptoms as well. CT angiogram evaluate for PE was performed and was negative. Patient's heart rate improved to the 1 teens but she continued to be short of breath when moving. She was comfortable on 2 L per nasal cannula while seated. She had no oxygen desaturations while at rest. Upon ambulation but the patient did drop her sats are 78% on room air and also was tachycardic to the 150s. This resolved when she was replaced on her oxygen. Patient normally would have no difficulty with this.  Given this change in the patient's baseline condition she will be admitted to family practice for COPD exacerbation.        Cyndra Numbers, MD 02/04/11 [phone]

## 2011-02-04 NOTE — H&P (Addendum)
Family Medicine Teaching [facility] Admission History and Physical  Patient name: Judith Boyer Medical record number: [id number] Date of birth: December 21, 1945 Age: 65 y.o. Gender: female  Primary Care Provider: No primary provider on file.  Chief Complaint: Difficulty breathing History of Present Illness: Judith Boyer is a 65 y.o. female presenting with SOB since last Wednesday that has been worsening. Started with an upper respiratory symptoms like a "sinusitis" this is d the way described by the patient. With increasing rhinorrhea, and soreness on her face. Had also fever and body ache. Went yesterday to [facility] and was diagnosed with PNA started on Avelox, Prednisone 60 mg and inhalers but pt today after taking her medications started to increase her SOB and felt pain like pressure on her chest. She had in 2009 a heart event with stents placed and refers to thi event of today as almost the same pressure- like but no as intense and no irradiating to her left arm. EMS came to rescue and gave he ASA + nitroglycerine X1 and the pain was relived. Came to ED and a full work up was done with CXR, CAT, CE, EKG and labs only finding positive her hypoxia at rest. It is decided to admitted to [facility]MTS.  There is no problem list on file for this patient.  Past Medical History: Past Medical History  Diagnosis Date  . Myocardial infarction   . COPD (chronic obstructive pulmonary disease)   . Hypertension    Past Surgical History: Past Surgical History  Procedure Date  . Abdominal hysterectomy   . Shoulder surgery   . Coronary stent placement    Social History: History   Social History  . Marital Status: Widowed    Spouse Name: N/A    Number of Children: N/A  . Years of Education: N/A   Social History Main Topics  . Smoking status: Current Everyday Smoker -- 1.0 packs/day  . Smokeless tobacco: None  . Alcohol Use: No  . Drug Use: No  . Sexually Active:    Other  Topics Concern  . None   Social History Narrative  . None   Family History: History reviewed. No pertinent family history. Allergies: Allergies  Allergen Reactions  . Codeine   . Doxycycline   . Lisinopril    Medication Dose Route Frequency  . 0.9 %  sodium chloride infusion  250 mL Intravenous PRN  . albuterol (PROVENTIL) (5 MG/ML) 0.5% nebulizer solution 2.5 mg  2.5 mg Nebulization Q4H  . albuterol (PROVENTIL) (5 MG/ML) 0.5% nebulizer solution 2.5 mg  2.5 mg Nebulization Q2H PRN  . albuterol (PROVENTIL) (5 MG/ML) 0.5% nebulizer solution 5 mg  5 mg Nebulization Once  . albuterol (PROVENTIL) (5 MG/ML) 0.5% nebulizer solution 5 mg  5 mg Nebulization Once  . clopidogrel (PLAVIX) tablet 75 mg  75 mg Oral Daily  . Fluticasone-Salmeterol (ADVAIR) 500-50 MCG/DOSE inhaler 1 puff  1 puff Inhalation Q12H  . heparin injection 5,000 Units  5,000 Units Subcutaneous Q8H  . iohexol (OMNIPAQUE) 300 MG/ML solution 100 mL  100 mL Intravenous Once PRN  . ipratropium (ATROVENT) nebulizer solution 0.5 mg  0.5 mg Nebulization Once  . ipratropium (ATROVENT) nebulizer solution 0.5 mg  0.5 mg Nebulization Q6H  . LORazepam (ATIVAN) tablet 0.5 mg  0.5 mg Oral Once  . losartan (COZAAR) tablet 50 mg  50 mg Oral Daily  . montelukast (SINGULAIR) tablet 10 mg  10 mg Oral QHS  . moxifloxacin (AVELOX) tablet 400 mg  400 mg Oral q1800  . predniSONE (DELTASONE) tablet 50 mg  50 mg Oral Q breakfast  . sodium chloride 0.9 % bolus 500 mL  500 mL Intravenous Once  . sodium chloride 0.9 % injection 3 mL  3 mL Intravenous Q12H  . sodium chloride 0.9 % injection 3 mL  3 mL Intravenous PRN    Review Of Systems: Per HPI  Otherwise 12 point review of systems was performed and was unremarkable.  Physical Exam: Filed Vitals:   02/04/11 1851  BP: 162/80  Pulse: 117  Temp: 97 F (36.1 C)  Resp: 25   Gen:  Moderated distress due to dyspnea. HEENT: Moist mucous membranes CV: Regular rate and rhythm, no murmurs rubs  or gallops PULM: Breath sound diminished bilaterally. Wheezes presents more on the left than on the right lung field. ABD: Soft, non tender, non distended, normal bowel sounds EXT: No edema Neuro: Alert and oriented x3. No focalization   Labs and Imaging: Lab Results  Component Value Date/Time   NA 136 02/04/2011 12:46 PM   K 3.9 02/04/2011 12:46 PM   CL 95* 02/04/2011 12:46 PM   CO2 29 02/04/2011 12:46 PM   BUN 16 02/04/2011 12:46 PM   CREATININE 0.49* 02/04/2011 12:46 PM   GLUCOSE 142* 02/04/2011 12:46 PM   Lab Results  Component Value Date   WBC 10.7* 02/04/2011   HGB 12.0 02/04/2011   HCT 36.6 02/04/2011   MCV 86.5 02/04/2011   PLT 281 02/04/2011    Assessment and Plan: Judith Boyer is a 65 y.o.  female admitted for dyspnea and hypoxia. 1. Respiratory: She has HX of COPD is a current smoker. This corresponds most likely with an episode of viral illness exacerbating her COPD. -Continue Prednisone, albuterol Q4 scheduled and Q2 PRN. Avelox since abx can reduce lengh in COPD exacerbations. O2 [place] and Close monitoring of hypoxia. 2. Cardiovascular: Hx of MI with stents in 2009. - Continue antiplatelet regimen. Telemetry unit, will cycle CE, EKG, lipid profile. 3. HTN : continue home meds and monitor. 4. DM -A1C and  SSI. Metformin held due to recent contrast studies.  5.FEN/GI: healthy diet. IVF lock  6. Prophylaxis:  7. Disposition:   D. [place] Rolene Arbour PGY-1 FMTS Pager [phone]  I have seen the above patient with Dr. Aviva Signs and I agree with her physical exam and assessment and plan.  Briefly, this is a 65 y.o. Female with viral illness that now most likely has triggered a copd exacerbation.  Pt pulse ox wnl on 2 L, but + increased wob and unable to complete sentence due to sob. Will treat with avelox, pt had only 1 dose of this as a outpt before coming to ER today for worsening sob.  Will treat with prednisone, albuterol, and atrovent.  Also o2 [place] prn.  Will also  cycle cardiac enzymes to r/o cardiac event in the setting of chest pain.  If cardiac event ruled out, Chest pain could also could be from anxiety.  Will monitor pt closely on Telemetry.    Ellin Mayhew, MD PGY-3

## 2011-02-04 NOTE — ED Notes (Signed)
Pt returned from CT scan, remains on cardiac monitor. Breathing labored. She is alert and oriented x4. Family at bedside.

## 2011-02-05 ENCOUNTER — Other Ambulatory Visit: Payer: Self-pay

## 2011-02-05 LAB — COMPREHENSIVE METABOLIC PANEL
ALT: 25 U/L (ref 0–35)
Alkaline Phosphatase: 79 U/L (ref 39–117)
CO2: 30 mEq/L (ref 19–32)
Chloride: 99 mEq/L (ref 96–112)
GFR calc Af Amer: >90 mL/min (ref >90)
Glucose, Bld: 161 mg/dL — ABNORMAL HIGH (ref 70–99)
Potassium: 3.7 mEq/L (ref 3.5–5.1)
Sodium: 137 mEq/L (ref 135–145)
Total Bilirubin: 0.1 mg/dL — ABNORMAL LOW (ref 0.3–1.2)
Total Protein: 6.4 g/dL (ref 6.0–8.3)

## 2011-02-05 LAB — BLOOD GAS, ARTERIAL
Acid-Base Excess: 5 mmol/L — ABNORMAL HIGH (ref 0.0–2.0)
Patient temperature: 98.6
TCO2: 31.9 mmol/L (ref 0–100)
pH, Arterial: 7.357 (ref 7.350–7.400)

## 2011-02-05 LAB — LIPID PANEL: Total CHOL/HDL Ratio: 2.8 RATIO

## 2011-02-05 LAB — GLUCOSE, CAPILLARY: Glucose-Capillary: 103 mg/dL — ABNORMAL HIGH (ref 70–99)

## 2011-02-05 LAB — PRO B NATRIURETIC PEPTIDE: Pro B Natriuretic peptide (BNP): 776.8 pg/mL — ABNORMAL HIGH (ref 0–125)

## 2011-02-05 LAB — CARDIAC PANEL(CRET KIN+CKTOT+MB+TROPI)
Relative Index: 5.6 — ABNORMAL HIGH (ref 0.0–2.5)
Relative Index: 5 — ABNORMAL HIGH (ref 0.0–2.5)

## 2011-02-05 MED ORDER — ALBUTEROL SULFATE (5 MG/ML) 0.5% IN NEBU
2.5000 mg | INHALATION_SOLUTION | Freq: Four times a day (QID) | RESPIRATORY_TRACT | Status: DC
  Administered 2011-02-05 – 2011-02-09 (×16): 2.5 mg via RESPIRATORY_TRACT
  Filled 2011-02-05 (×17): qty 0.5

## 2011-02-05 MED ORDER — ACETAMINOPHEN 325 MG PO TABS
650.0000 mg | ORAL_TABLET | Freq: Four times a day (QID) | ORAL | Status: DC | PRN
  Administered 2011-02-05 – 2011-02-08 (×4): 650 mg via ORAL
  Filled 2011-02-05 (×4): qty 2

## 2011-02-05 MED ORDER — IPRATROPIUM-ALBUTEROL 18-103 MCG/ACT IN AERO
2.0000 | INHALATION_SPRAY | RESPIRATORY_TRACT | Status: DC | PRN
  Administered 2011-02-09: 2 via RESPIRATORY_TRACT
  Filled 2011-02-05: qty 14.7

## 2011-02-05 NOTE — Progress Notes (Signed)
Family Medicine Teaching Service Attending Note  I interviewed and examined patient Judith Boyer and reviewed their tests and x-rays.  I discussed with Dr. Aviva Signs and reviewed their note for today.  I agree with their assessment and plan.     Additionally  Currently feels about the same as admission Mild increased work of breathing with audible wheeze 96% on 3.5 liters  ABG noted  COPD exacerbation - continue treatment Elevated Ck MB with normal troponin and ECG - doubt ACS but given increased work load with current illness and intermittent chest pain and  known CAD would consult her cardiologist

## 2011-02-05 NOTE — Progress Notes (Addendum)
Pt stated she awoke with SOB. SaO2 86% on 2L. Increased Oxygen to 3L and administered albuterol nebulizer. Pt SaO2 98% on 3L. Will continue to monitor

## 2011-02-05 NOTE — Progress Notes (Signed)
Pt SaO2 93% 2L. No complaints of SOB. Will continue to monitor

## 2011-02-05 NOTE — Progress Notes (Signed)
02/05/2011 @ 12:30 hrs. Oxygen decreased to 2.5 lpm from 3.5 lpm n/c per ABG results, RN notified, Lidia Collum, RRT/NPS, RCP

## 2011-02-05 NOTE — Progress Notes (Signed)
  Echocardiogram 2D Echocardiogram has been performed.  Juanita Laster Esterlene Atiyeh, RDCS 02/05/2011, 3:18 PM

## 2011-02-05 NOTE — Progress Notes (Signed)
Inpatient Diabetes Program Recommendations  AACE/ADA: New Consensus Statement on Inpatient Glycemic Control (2009)  Target Ranges:  Prepandial:   less than 140 mg/dL      Peak postprandial:   less than 180 mg/dL (1-2 hours)      Critically ill patients:  140 - 180 mg/dL   Reason: post-prandial hyperglycemia   Inpatient Diabetes Program Recommendations Insulin - Meal Coverage: Add 4 units with meals

## 2011-02-05 NOTE — H&P (Signed)
I interviewed and examined this patient and discussed the care plan with Dr. Aviva Signs and the [facility] team and agree with assessment and plan as documented in the admission note for today. We will cycle cardiac enzymes due to her episode of chest pain and consult Dr Algie Coffer if indicated.    Ellie Spickler A. Sheffield Slider, MD Family Medicine Teaching Service Attending  02/05/2011 11:24 AM

## 2011-02-05 NOTE — Progress Notes (Signed)
PGY-1 Daily Progress Note Family Medicine Teaching Service D. [place]oto Rolene Arbour, MD Service Pager: [phone]  Patient name: Judith Boyer  Medical record [id number] Date of birth:12-Feb-1945 Age: 65 y.o. Gender: female  LOS: 1 day   Subjective: Still with increased work of breathing. Afbrile. Good urine output.  Objective:  Vitals: Temp:   97.6 F (36.4 C) (12/31 0429) Pulse Rate: 101  (12/31 0429) Resp:  21  (12/31 0429) BP:  159/74 mmHg (12/31 0429) SpO2: 96 % (12/31 0900) Weight:  [117 lb 8.1 oz (53.3 kg)-119 lb 7.8 oz (54.2 kg)] 117 lb 8.1 oz (53.3 kg) (12/31 0500)  Intake/Output Summary (Last 24 hours) at 02/05/11 1054 Last data filed at 02/05/11 0817  Gross per 24 hour  Intake    240 ml  Output    800 ml  Net   -560 ml   Physical Exam: Gen: Moderated distress due to dyspnea.  HEENT: Moist mucous membranes  CV:  Tachycardic, no murmurs rubs or gallops. NoJVD PULM: Breath sound diminished bilaterally. Wheezes presents more on the left than on the right lung field.  ABD: Soft, non tender, non distended, normal bowel sounds  EXT: No edema  Neuro: Alert and oriented x3. No focalization   Labs and imaging:  CBC  Lab 02/04/11 1246  WBC 10.7*  HGB 12.0  HCT 36.6  PLT 281   BMET  Lab 02/05/11 0300 02/04/11 1246  NA 137 136  K 3.7 3.9  CL 99 95*  CO2 30 29  BUN 12 16  CREATININE 0.51 0.49*  LABGLOM -- --  GLUCOSE 161* --  CALCIUM 9.0 9.3   POCT I-STAT TROPONIN I     Status: Normal   Collection Time   02/04/11 12:58 PM      Component Value Range   Troponin i, poc 0.01  0.00 - 0.08 (ng/mL)   Comment 3           POCT I-STAT TROPONIN I     Status: Normal   Collection Time   02/04/11  3:15 PM      Component Value Range   Troponin i, poc 0.02  0.00 - 0.08 (ng/mL)   Comment 3           POCT I-STAT 3, BLOOD GAS (G3+)     Status: Abnormal   Collection Time   02/04/11  5:35 PM      Component Value Range   pH, Arterial 7.313 (*) 7.350 - 7.400    pCO2 arterial 52.1 (*) 35.0 - 45.0 (mmHg)   pO2, Arterial 121.0 (*) 80.0 - 100.0 (mmHg)   Bicarbonate 26.4 (*) 20.0 - 24.0 (mEq/L)   TCO2 28  0 - 100 (mmol/L)   O2 Saturation 98.0     Acid-base deficit 1.0  0.0 - 2.0 (mmol/L)   Collection site RADIAL, ALLEN'S TEST ACCEPTABLE     Drawn by Operator     Sample type ARTERIAL    CARDIAC PANEL(CRET KIN+CKTOT+MB+TROPI)     Status: Abnormal   Collection Time   02/04/11  7:13 PM      Component Value Range   Total CK 138  7 - 177 (U/L)   CK, MB 6.6 (*) 0.3 - 4.0 (ng/mL)   Troponin I <0.30  <0.30 (ng/mL)   Relative Index 4.8 (*) 0.0 - 2.5       Component Value Range   Total CK 140  7 - 177 (U/L)   CK, MB 7.8 (*) 0.3 - 4.0 (  ng/mL)   Troponin I <0.30  <0.30 (ng/mL)   Relative Index 5.6 (*) 0.0 - 2.5   LIPID PANEL     Status: Abnormal   Collection Time   02/05/11  3:30 AM      Component Value Range   Cholesterol 161  0 - 200 (mg/dL)   Triglycerides 161 (*) <150 (mg/dL)   HDL 58  >09 (mg/dL)   Total CHOL/HDL Ratio 2.8     VLDL 32  0 - 40 (mg/dL)   LDL Cholesterol 71  0 - 99 (mg/dL)   Ct Angio Chest W/cm &/or Wo Cm 02/04/2011    IMPRESSION:  1.  No evidence of acute pulmonary embolism other acute chest process. 2.  Moderate emphysema. 3.  Scattered atherosclerosis.  Original Report Authenticated By: Gerrianne Scale, M.D.   Dg Chest Portable 1 View 02/04/2011  IMPRESSION:  Stable hyperinflation and chronic mild interstitial prominence. Stable streaky atelectasis or scarring in the right middle lobe. No convincing pulmonary edema.  No focal infiltrate.  Original Report Authenticated By: Natasha Mead, M.D.   Medications: Medication Dose Route Frequency  . acetaminophen (TYLENOL) tablet 650 mg  650 mg Oral Q6H PRN  . albuterol (PROVENTIL) (5 MG/ML) 0.5% nebulizer solution 2.5 mg  2.5 mg Nebulization Q4H  . albuterol (PROVENTIL) (5 MG/ML) 0.5% nebulizer solution 2.5 mg  2.5 mg Nebulization Q2H PRN  . clopidogrel (PLAVIX) tablet 75 mg  75 mg  Oral Daily  . Fluticasone-Salmeterol (ADVAIR) 500-50 MCG/DOSE inhaler 1 puff  1 puff Inhalation Q12H  . heparin injection 5,000 Units  5,000 Units Subcutaneous Q8H  . insulin aspart (novoLOG) injection 0-9 Units  0-9 Units Subcutaneous TID WC  . ipratropium (ATROVENT) nebulizer solution 0.5 mg  0.5 mg Nebulization Q6H  . LORazepam (ATIVAN) tablet 0.5 mg  0.5 mg Oral Once  . losartan (COZAAR) tablet 50 mg  50 mg Oral Daily   . montelukast (SINGULAIR) tablet 10 mg  10 mg Oral QHS  . moxifloxacin (AVELOX) tablet 400 mg  400 mg Oral q1800  . predniSONE (DELTASONE) tablet 50 mg  50 mg Oral Q breakfast   Assessment and Plan:  Judith Boyer is a 65 y.o. female admitted for dyspnea and hypoxia. 1. Respiratory: She has HX of COPD is a current smoker. This corresponds most likely with an episode of viral illness exacerbating her COPD. Pt besides being on albuterol Q4H scheduled/ Q2 PRN, prednisone 50 mg daily and  Abx. Still with increased work of breathing and physical exam very diminish breath sounds bilaterally with  scattered wheezes. Pt was RO for PE and PNA  with CTA and CXR had normal ABG yesterday corresponding with her respiratory condition and metabolic compensation. - Repeat ABG. Consider step down unit if  -O2 continuous monitoring -Continue Prednisone, albuterol Q4 scheduled and Q2 PRN. O2 [place]. 2. Cardiovascular: Hx of MI with stents in 2009. Last ECHO in 2009 with normal EF. No sign of fluid overload but we would like to r/o cardiovascular component of this SOB.  CE x2 negatives. Pending EKG. Lipid profile with increase of triglycerides.  - Continue antiplatelet regimen. Telemetry unit.  3. HTN : Controlled. Continue home meds and monitor.  4. DM  -A1C and SSI. Metformin held due to recent contrast studies.  5.FEN/GI: healthy diet. IVF lock  6. Prophylaxis: heparin s/c TID 7. Dispo: pending improvement.  D. [place] Rolene Arbour, MD PGY1, [facility] Medicine Teaching Service Pager [phone] 02/05/2011

## 2011-02-05 NOTE — Progress Notes (Signed)
CRITICAL VALUE ALERT  Critical value received: CK-MB 6.6  Date of notification: 02/04/11  Time of notification: 2022  Critical value read back: yes  Nurse who received alert: Alfonso Ellis, RN  MD notified (1st page): Dr. Aviva Signs  Time of first page: 2100  MD notified (2nd page):  Time of second page:  Responding MD: Dr. Aviva Signs  Time MD responded: 2100

## 2011-02-06 DIAGNOSIS — I10 Essential (primary) hypertension: Secondary | ICD-10-CM | POA: Diagnosis present

## 2011-02-06 DIAGNOSIS — I219 Acute myocardial infarction, unspecified: Secondary | ICD-10-CM

## 2011-02-06 DIAGNOSIS — J449 Chronic obstructive pulmonary disease, unspecified: Secondary | ICD-10-CM | POA: Diagnosis present

## 2011-02-06 DIAGNOSIS — E119 Type 2 diabetes mellitus without complications: Secondary | ICD-10-CM | POA: Diagnosis present

## 2011-02-06 DIAGNOSIS — F17200 Nicotine dependence, unspecified, uncomplicated: Secondary | ICD-10-CM | POA: Diagnosis present

## 2011-02-06 HISTORY — PX: CARDIAC CATHETERIZATION: SHX172

## 2011-02-06 HISTORY — DX: Acute myocardial infarction, unspecified: I21.9

## 2011-02-06 LAB — BASIC METABOLIC PANEL
BUN: 13 mg/dL (ref 6–23)
CO2: 36 mEq/L — ABNORMAL HIGH (ref 19–32)
Chloride: 98 mEq/L (ref 96–112)
Creatinine, Ser: 0.59 mg/dL (ref 0.50–1.10)
GFR calc Af Amer: >90 mL/min (ref >90)

## 2011-02-06 LAB — CBC
HCT: 38.2 % (ref 36.0–46.0)
Hemoglobin: 12.3 g/dL (ref 12.0–15.0)
MCH: 28.7 pg (ref 26.0–34.0)
MCHC: 32.2 g/dL (ref 30.0–36.0)
MCV: 89 fL (ref 78.0–100.0)
Platelets: 302 10*3/uL (ref 150–400)
RBC: 4.29 MIL/uL (ref 3.87–5.11)
RDW: 13.9 % (ref 11.5–15.5)
WBC: 8.5 10*3/uL (ref 4.0–10.5)

## 2011-02-06 LAB — GLUCOSE, CAPILLARY
Glucose-Capillary: 132 mg/dL — ABNORMAL HIGH (ref 70–99)
Glucose-Capillary: 206 mg/dL — ABNORMAL HIGH (ref 70–99)

## 2011-02-06 LAB — PRO B NATRIURETIC PEPTIDE: Pro B Natriuretic peptide (BNP): 571.5 pg/mL — ABNORMAL HIGH (ref 0–125)

## 2011-02-06 MED ORDER — METFORMIN HCL 500 MG PO TABS
500.0000 mg | ORAL_TABLET | Freq: Two times a day (BID) | ORAL | Status: DC
  Administered 2011-02-06 – 2011-02-08 (×5): 500 mg via ORAL
  Filled 2011-02-06 (×8): qty 1

## 2011-02-06 MED ORDER — GUAIFENESIN ER 600 MG PO TB12
1200.0000 mg | ORAL_TABLET | Freq: Two times a day (BID) | ORAL | Status: DC | PRN
  Filled 2011-02-06: qty 2

## 2011-02-06 NOTE — Progress Notes (Signed)
Subjective: Interval History: has complaints dyspnea on exertion and during sleep. Mild cough, now non-productive.Chest tightness but denies chest pain.  Objective: Vital signs in last 24 hours: Temp:  [97.3 F (36.3 C)-97.6 F (36.4 C)] 97.6 F (36.4 C) (01/01 0427) Pulse Rate:  [86-102] 92  (01/01 0427) Resp:  [16-22] 16  (01/01 0427) BP: (119-151)/(70-78) 151/78 mmHg (01/01 0427) SpO2:  [93 %-97 %] 97 % (01/01 0427) Weight:  [117 lb 4.6 oz (53.2 kg)] 117 lb 4.6 oz (53.2 kg) (01/01 0427)  Intake/Output from previous day: 12/31 0701 - 01/01 0700 In: 843 [P.O.:840; I.V.:3] Out: 1450 [Urine:1450] Intake/Output this shift: Total I/O In: 300 [P.O.:300] Out: 200 [Urine:200]  General appearance: alert, cooperative, no distress and increased work of breathing. Speaking slowly but in full sentences.  Lungs: increased WOB, use of scalene muscles. BS diminished yet equal bilterally. No wheezing.  Heart: regular rate and rhythm, S1, S2 normal, no murmur, click, rub or gallop Abdomen: soft, non-tender; bowel sounds normal; no masses,  no organomegaly Extremities: extremities normal, atraumatic, no cyanosis or edema Neurologic: Grossly normal  Studies/Results: WBC 8.5 CBGS: 132 (103-297)  Cr 0.59 ECHO-pending  Scheduled Meds:  . albuterol  2.5 mg Nebulization Q6H  . clopidogrel  75 mg Oral Daily  . Fluticasone-Salmeterol  1 puff Inhalation Q12H  . heparin  5,000 Units Subcutaneous Q8H  . insulin aspart  0-9 Units Subcutaneous TID WC  . ipratropium  0.5 mg Nebulization Q6H  . losartan  50 mg Oral Daily  . montelukast  10 mg Oral QHS  . moxifloxacin  400 mg Oral q1800  . predniSONE  50 mg Oral Q breakfast  . sodium chloride  3 mL Intravenous Q12H  . DISCONTD: albuterol  2.5 mg Nebulization Q4H  PRN Meds: acetaminophen, albuterol, albuterol-ipratropium, guaiFENesin  Assessment/Plan: 66 yo F with COPD presents with SOB.  1. COPD exacerbation A: ruled out PE, no evidence of  pneumonia on CXR. Bibasilar haziness on CXR concerning for atelectasis vs small effusions. Patient reports feeling about the same since admission. Able to ambulate comfortably to the sink of 3L [place]. No home O2 requirement. First hospitalization for COPD. P: -Continue Avelox and prednisone.  -Continue breathing treatments.  -Added mucinex -Check Flu PCR -f/u ECHO results (study done yesterday)  -f/u BNP.   2. HTN A: BP elevated this AM and once yesterday. Has been well controlled otherwise. Home home ARB. P: F/u BPs. If elevated x 2 in a row. Start BB.    3. Diabetes:  A: CBGs within normal limits on steroids. Creatinine normal. Patient eating most of her meals.  P: start home metformin at half dose.    4. FEN/GI: electrolytes WNL. Continue carb controlled diet.   5. DVT PPX: continue SQ heparin.   6. Dispo: to home pending clinical improvement. Goal is off O2 although she may need short term home O2 following this admission.    LOS: 2 days   [facility]

## 2011-02-06 NOTE — Progress Notes (Signed)
I have seen and examined this patient. I have discussed with Funches.  I agree with their findings and plans as documented in their progress note for today.  Acute issues  Acute Exacerbation of COPD. Slowly improving.    Tobacco Dependence Pt interested in smoking cessation. No desire for cigarette currently. Smokes [place] Lites, 3/4 of a pack daily. Smokes cigarette within 5 minutes of awakening each morning.  Oxygen desaturation at night (+) loud snoring per pt and her son Hx of waking up gasping per son and pt. (+) short sleep latency hx. Small oropharyngeal airway on physical exam Concern for Obstructive Sleep Apnea.   Plan: Continue Abx for 5 days Continue corticosteroid without taper for 7 days.  Switch to MDI for short acting bronchodilator therapy for atrovent and albuterol.   Smoking Cessation Nurse consultation:  Pt may benefit from Nicotene replacement therapy at discharge.    Set up Polysomnography Sleep study at discharge (Split study)  Patient may need O2 at discharge.  Will need SaO2 assessed at rest on room air, then SaO2 with exertion on room air, then SaO2 with exertion on 2 L/min before discharge to see if she qualifies for Home Oxygen therapy.

## 2011-02-07 LAB — GLUCOSE, CAPILLARY: Glucose-Capillary: 273 mg/dL — ABNORMAL HIGH (ref 70–99)

## 2011-02-07 MED ORDER — PETROLATUM EX OINT
1.0000 "application " | TOPICAL_OINTMENT | CUTANEOUS | Status: DC | PRN
  Filled 2011-02-07 (×2): qty 1

## 2011-02-07 NOTE — Progress Notes (Signed)
Inpatient Diabetes Program Recommendations  AACE/ADA: New Consensus Statement on Inpatient Glycemic Control (2009)  Target Ranges:  Prepandial:   less than 140 mg/dL      Peak postprandial:   less than 180 mg/dL (1-2 hours)      Critically ill patients:  140 - 180 mg/dL   Reason for Visit: Note patient is on Prednisone 50 mg daily.  Fasting CBG=119 mg/dl however up to 161 mg/dL at lunch.  Would benefit from addition of Novolog meal coverage.  Inpatient Diabetes Program Recommendations Insulin - Meal Coverage: Add 4 units with meals

## 2011-02-07 NOTE — Progress Notes (Signed)
Subjective: Interval History: has complaints continued dyspnea on exertion as well as minimal SOB at rest.  States she feels she has improved since admission to hospital however.  No CP.  Afebrile.  Ambulating and sitting in chair during day.  States "still feels like I'm working to breathe".  Objective: Vital signs in last 24 hours: Temp:  [97.5 F (36.4 C)-97.9 F (36.6 C)] 97.5 F (36.4 C) (01/02 0410) Pulse Rate:  [91-97] 91  (01/02 0410) Resp:  [16-18] 16  (01/02 0410) BP: (122-137)/(58-80) 136/80 mmHg (01/02 0410) SpO2:  [91 %-98 %] 94 % (01/02 0900)  Intake/Output from previous day: 01/01 0701 - 01/02 0700 In: 763 [P.O.:760; I.V.:3] Out: 800 [Urine:800] Intake/Output this shift:    General appearance: alert, cooperative, appears stated age and no distress.  Receiving albuterol treatment when I visited with her.  Cardiac:  Regular rate and rhythm without murmur auscultated.  Good S1/S2. Lungs:  Diffuse wheezing throughout.  Prolonged expiratory phase.  Abd:  Soft/nondistended/nontender.  Good bowel sounds throughout all four quadrants.  No masses noted.  Ext:  No clubbing/cyanosis/erythema.  No edema noted bilateral lower extremities.   Neuro:  Grossly normal   Results for orders placed during the hospital encounter of 02/04/11 (from the past 24 hour(s))  GLUCOSE, CAPILLARY     Status: Abnormal   Collection Time   02/06/11 11:30 AM      Component Value Range   Glucose-Capillary 206 (*) 70 - 99 (mg/dL)   Comment 1 Documented in Chart     Comment 2 Notify RN    GLUCOSE, CAPILLARY     Status: Abnormal   Collection Time   02/06/11  4:33 PM      Component Value Range   Glucose-Capillary 219 (*) 70 - 99 (mg/dL)  GLUCOSE, CAPILLARY     Status: Abnormal   Collection Time   02/07/11  5:52 AM      Component Value Range   Glucose-Capillary 119 (*) 70 - 99 (mg/dL)    Studies/Results: Ct Angio Chest W/cm &/or Wo Cm  02/04/2011  *RADIOLOGY REPORT*  Clinical Data:  Chest pain with  shortness of breath.  Evaluate for pulmonary embolism.  History of diabetes, hypertension, COPD and myocardial infarction.  CT ANGIOGRAPHY CHEST WITH CONTRAST  Technique:  Multidetector CT imaging of the chest was performed using the standard protocol during bolus administration of intravenous contrast.  Multiplanar CT image reconstructions including MIPs were obtained to evaluate the vascular anatomy.  Contrast:  100 ml Omnipaque-300 intravenously.  Comparison:  Chest radiographs same date.  No prior CT.  Findings:  The pulmonary arteries are well opacified with contrast. There is no evidence of acute pulmonary embolism.  There is atherosclerosis of the great vessels, aorta and coronary arteries.  Scattered normal-sized hilar and mediastinal lymph nodes are noted. There are no pathologically enlarged lymph nodes.  There is no pleural or pericardial effusion.  Moderate emphysematous changes are present throughout the lungs. No airspace disease, suspicious nodule or endobronchial lesion is identified.  The visualized upper abdomen demonstrates no acute or significant findings.  Review of the MIP images confirms the above findings.  IMPRESSION:  1.  No evidence of acute pulmonary embolism other acute chest process. 2.  Moderate emphysema. 3.  Scattered atherosclerosis.  Original Report Authenticated By: Gerrianne Scale, M.D.   Dg Chest Portable 1 View  02/04/2011  *RADIOLOGY REPORT*  Clinical Data: Shortness of breath, chest pain  PORTABLE CHEST - 1 VIEW  Comparison: 12/06/2007  Findings: Cardiomediastinal silhouette is unremarkable. Stable hyperinflation and chronic mild interstitial prominence.  Stable streaky atelectasis or scarring in the right middle lobe.  No convincing pulmonary edema.  No focal infiltrate.  IMPRESSION:  Stable hyperinflation and chronic mild interstitial prominence. Stable streaky atelectasis or scarring in the right middle lobe. No convincing pulmonary edema.  No focal infiltrate.   Original Report Authenticated By: Natasha Mead, M.D.    Scheduled Meds:   . albuterol  2.5 mg Nebulization Q6H  . clopidogrel  75 mg Oral Daily  . Fluticasone-Salmeterol  1 puff Inhalation Q12H  . heparin  5,000 Units Subcutaneous Q8H  . insulin aspart  0-9 Units Subcutaneous TID WC  . ipratropium  0.5 mg Nebulization Q6H  . losartan  50 mg Oral Daily  . metFORMIN  500 mg Oral BID WC  . montelukast  10 mg Oral QHS  . moxifloxacin  400 mg Oral q1800  . predniSONE  50 mg Oral Q breakfast  . sodium chloride  3 mL Intravenous Q12H   Continuous Infusions:  PRN Meds:sodium chloride, acetaminophen, albuterol, albuterol-ipratropium, guaiFENesin, sodium chloride  Assessment/Plan: 66 yo F with COPD presents with SOB, being treated as COPD exacerbation:  1. COPD exacerbation:  CXR showed small effusions, no evidence of PNA, no fevers.  Still on about 2.5 L O2.  Sats drop to mid 80's when taken off O2.  On Avelox and Prednisone.  Plan to continue breathing treatments.  See below regading Echo results.  Overall she is slowly improving since admit.  2. HTN:  Blood pressure 130s systolic.  On home ARB.  Plan to continue.  No need for further anti-hypertensives at this time.    3.  Echo results:  Showed mildly decreased EF of 50 - 55% as well as dsevere hypokinesis of the mid-distalinferoseptal Myocardium.  Sees Dr. Algie Coffer for cardiology, last visit was several years ago. EKG shows Borderline Q-waves in leads II, III, AVF which would be consistent with Echo findings.  Denies knowledge of any past MI.  No acute symptoms at this time, do not believe she is experiencing acute heart failure based on exam and BPR, which was equivocal at 571.  No rales or edema noted.  Plan to close outpatient cardiology followup.    3. Diabetes: CBGs within normal limits on steroids. Creatinine normal. Patient eating most of her meals.  On half of home Metformin dose.    4.  Smoking cessation:  Patient interested, will  place consult.    5.  Hx/o sinus infection:  Should be covered by Avelox.  Was being treated prior to admission, unable to remember Abx name.    6. FEN/GI: electrolytes WNL. Continue carb controlled diet.   7. DVT PPX: continue SQ heparin.   8. Dispo: to home pending clinical improvement. Goal is off O2 although she may need short term home O2 following this admission.  Hopeful for home tomorrow.  Would likely benefit from PFTs and possibly addition of Singulair to regimen as outpt, please place this is DC Summary.      LOS: 3 days   Kenlynn Houde,JEFF

## 2011-02-07 NOTE — Progress Notes (Signed)
02-07-11 UR completed. Ronny Flurry RN BSn

## 2011-02-07 NOTE — Progress Notes (Signed)
Family Medicine Teaching Service Attending Note  I interviewed and examined patient Judith Boyer and reviewed their tests and x-rays.  I discussed with Dr. Lynetta Grant and reviewed their note for today.  I agree with their assessment and plan.     Additionally  Improved since admission and slowly weaning O2. Echo results noted.  Should follow up with Dr Algie Coffer as otpt since currently asymptomatic from a cardiac standpoint

## 2011-02-07 NOTE — Progress Notes (Signed)
02 sat at 95% on 2L while resting in room.  Pt. Ambulated 40 ft without oxygen, 02 sat dropped to 89 %, pt. Took three resting stops, c/o SOB. Pt. Had just had a breathing treatment prior to walk. 2l 02 applied at end of walk , 02 sat up to 94%. Hiral Lukasiewicz, Chrystine Oiler

## 2011-02-08 LAB — CBC
HCT: 37.1 % (ref 36.0–46.0)
Hemoglobin: 11.7 g/dL — ABNORMAL LOW (ref 12.0–15.0)
MCH: 27.9 pg (ref 26.0–34.0)
MCV: 88.3 fL (ref 78.0–100.0)
Platelets: 326 10*3/uL (ref 150–400)
RBC: 4.2 MIL/uL (ref 3.87–5.11)
WBC: 10.1 10*3/uL (ref 4.0–10.5)

## 2011-02-08 LAB — GLUCOSE, CAPILLARY
Glucose-Capillary: 130 mg/dL — ABNORMAL HIGH (ref 70–99)
Glucose-Capillary: 205 mg/dL — ABNORMAL HIGH (ref 70–99)
Glucose-Capillary: 198 mg/dL — ABNORMAL HIGH (ref 70–99)

## 2011-02-08 LAB — BASIC METABOLIC PANEL
CO2: 32 mEq/L (ref 19–32)
Chloride: 99 mEq/L (ref 96–112)
Glucose, Bld: 109 mg/dL — ABNORMAL HIGH (ref 70–99)
Potassium: 4 mEq/L (ref 3.5–5.1)
Sodium: 140 mEq/L (ref 135–145)

## 2011-02-08 MED ORDER — SODIUM CHLORIDE 0.9 % IJ SOLN: 3.0000 mL | INTRAMUSCULAR | Status: DC | PRN

## 2011-02-08 MED ORDER — SODIUM CHLORIDE 0.9 % IV SOLN: 250.0000 mL | INTRAVENOUS | Status: DC | PRN

## 2011-02-08 MED ORDER — SODIUM CHLORIDE 0.9 % IJ SOLN
3.0000 mL | Freq: Two times a day (BID) | INTRAMUSCULAR | Status: DC
  Administered 2011-02-09 (×2): 3 mL via INTRAVENOUS

## 2011-02-08 NOTE — Progress Notes (Signed)
Pt smokes 15 cigarettes per day and states she's planning on quitting cold [place] after d/c. If her quit attempt doesn't  Work out for her she will call me to f/u and get instructions for medical aids we discussed. Referred to 1-800 quit now for f/u and support. Discussed oral fixation substitutes, second hand smoke and in home smoking policy. Reviewed and gave pt Written education/contact information.

## 2011-02-08 NOTE — Discharge Summary (Signed)
Physician Discharge Summary  Patient ID: Judith Boyer [id number] 11-23-1945 66 y.o.  Admit date: 02/04/2011 Discharge date: 02/09/2011  PCP: Earl Lites, MD of [facility] Consultants: Orpah Cobb, MD of Cardiology   Discharge Diagnosis: Principal Problem:  *COPD exacerbation Active Problems:  Smoker  Hypertension  Diabetes mellitus  Chest pain at rest  CAD in native artery    Hospital Course 66 yo F with COPD presenting with SOB related to a COPD exacerbation:   1. COPD exacerbation: Initially treated with Avelox as outpatient for acute acquired pneumonia in addition to prednisone and inhalers for COPD. Due to increased WOB and chest pain, admitted for further management. In ED, had CT angiogram which was negative for pulmonary embolism.  In hospital, she was placed on Avelox and Prednisone with q6 albuterol and ipratropium, singulair, and home advair. Patient with slow improvement during hospitalization. On day before discharge, patient's dyspnea and some some chest tightness seemed to persist and was out of proportion to her hypoxia, so cardiology was consulted for a possible cardiac component. Patient had cardiac enzymes negative x3 prior to cardiology evaluation. Please see problem #2.  Patient was able to maintain pulse ox in the low 90s and was gradually weaned from her oxygen. She was asymptomatic prior to discharge while resting and reported that with exertion she was somewhat winded but was relieved with her rescue inhalers.  She is to finish a 10 day course of Avelox as well as an additional nine-day steroid taper for both her pneumonia and COPD exacerbation. 2. History of CAD- continued on Plavix, ASA. Troponins negative x3. CK, MB elevated from 6.6-7.8.  Echo results: Showed mildly decreased EF of 50 - 55% as well as dsevere hypokinesis of the mid-distal inferoseptal Myocardium.  -  Cardiology was concerned due to her prior history of coronary artery disease and  decided upon cardiac catheterization. Overall her cardiac catheterization was unremarkable with some moderate stenosis however can be optimally managed with medications alone. Her ejection fraction was estimated to be 60%. The day of discharge patient underwent cardiac catheterization uneventfully and did well throughout the day. EKG shows Borderline Q-waves in leads II, III, AVF which would be consistent with Echo findings. Denies knowledge of any past MI. No acute symptoms at this time, do not believe she is experiencing acute heart failure based on exam and equivocal BNP of 571.  3. Smoking cessation:  Patient states she will not smoke anymore after this hospitalization.  Smoking cessation counseling was provided during his hospitalization.   4. HTN: Well controlled On home ARB.  5. Diabetes: Creatinine normal. Patient eating most of her meals. On half of home Metformin dose with moderate control with 1 outlier CBG to 273.  6. Hx/o sinus infection: Should be covered by Avelox. Was being treated prior to admission, unable to remember Abx name.      Procedures/Imaging:  Ct Angio Chest W/cm &/or Wo Cm  02/04/2011  *RADIOLOGY REPORT*  Clinical Data:  Chest pain with shortness of breath.  Evaluate for pulmonary embolism.  History of diabetes, hypertension, COPD and myocardial infarction.  CT ANGIOGRAPHY CHEST WITH CONTRAST  Technique:  Multidetector CT imaging of the chest was performed using the standard protocol during bolus administration of intravenous contrast.  Multiplanar CT image reconstructions including MIPs were obtained to evaluate the vascular anatomy.  Contrast:  100 ml Omnipaque-300 intravenously.  Comparison:  Chest radiographs same date.  No prior CT.  Findings:  The pulmonary arteries are well opacified  with contrast. There is no evidence of acute pulmonary embolism.  There is atherosclerosis of the great vessels, aorta and coronary arteries.  Scattered normal-sized hilar and mediastinal  lymph nodes are noted. There are no pathologically enlarged lymph nodes.  There is no pleural or pericardial effusion.  Moderate emphysematous changes are present throughout the lungs. No airspace disease, suspicious nodule or endobronchial lesion is identified.  The visualized upper abdomen demonstrates no acute or significant findings.  Review of the MIP images confirms the above findings.  IMPRESSION:  1.  No evidence of acute pulmonary embolism other acute chest process. 2.  Moderate emphysema. 3.  Scattered atherosclerosis.  Original Report Authenticated By: Gerrianne Scale, M.D.   Dg Chest Portable 1 View  02/04/2011  *RADIOLOGY REPORT*  Clinical Data: Shortness of breath, chest pain  PORTABLE CHEST - 1 VIEW  Comparison: 12/06/2007  Findings: Cardiomediastinal silhouette is unremarkable. Stable hyperinflation and chronic mild interstitial prominence.  Stable streaky atelectasis or scarring in the right middle lobe.  No convincing pulmonary edema.  No focal infiltrate.  IMPRESSION:  Stable hyperinflation and chronic mild interstitial prominence. Stable streaky atelectasis or scarring in the right middle lobe. No convincing pulmonary edema.  No focal infiltrate.  Original Report Authenticated By: Natasha Mead, M.D.    Labs  CBC  Lab 02/08/11 0505 02/06/11 0540 02/04/11 1246  WBC 10.1 8.5 10.7*  HGB 11.7* 12.3 12.0  HCT 37.1 38.2 36.6  PLT 326 302 281   BMET  Lab 02/08/11 0505 02/06/11 0540 02/05/11 0300 02/04/11 1246  NA 140 140 137 --  K 4.0 3.6 3.7 --  CL 99 98 99 --  CO2 32 36* 30 --  BUN 16 13 12  --  CREATININE 0.61 0.59 0.51 --  CALCIUM 8.8 9.2 9.0 --  PROT -- -- 6.4 6.5  BILITOT -- -- 0.1* 0.1*  ALKPHOS -- -- 79 83  ALT -- -- 25 24  AST -- -- 18 19  GLUCOSE 109* 110* 161* --   CBG (last 3)   Basename 02/09/11 1625 02/09/11 1202 02/09/11 0825  GLUCAP 235* 213* 138*         Patient condition at time of discharge/disposition: stable  Disposition-home  Discharge Exam  by Gaspar Bidding, DO PE: GENERAL:  Elderly caucasian female examined in [facility].  In no acute distress, no resp distress.  [place] O2 initially on but was removed at a later visit. HNEENT: AT/ [place].  Neck supple with midline trachea, no scleral icterus, MMM THORAX: HEART: RRR, s1/s2, no murmur LUNGS: Mild diffuse expiratory wheezes with good airmovement ABDOMEN:  +BS soft non-tender, no guarding, no rigidity EXTREMITIES: warm well perfused, walks unassisted, moves all 4 extremities spontaneously without lateralization    Follow up issues: 1. Patient will need close monitoring of her respiratory status and may benefit from further evaluation of her lung function as an outpatient. She will also need continued optimization of her chronic medical conditions and follow-up regarding smoking cessation.    Discharge follow up:  Follow-up Information    Make an appointment with DAUB,STEVE A, MD. (in 1-2 weeks for hospital follow up. )    Contact information:   [address] [place] [address] [phone]       Follow up with Ricki Rodriguez, MD. Make an appointment in 1 week. (call)    Contact information:   [address] [place] [address] [phone]         Discharge Orders  Future Appointments: Provider: Department: Dept Phone: Center:   02/27/2011 9:00 AM Lucilla Edin, MD [facility] [phone]     Future Orders Please Complete By Expires   Diet - low sodium heart healthy      Increase activity slowly         Discharge Medications   [place], [place]  Home Medication Instructions UJW:[id number]   Printed on:02/09/11 2354  Medication Information                    Fluticasone-Salmeterol (ADVAIR) 500-50 MCG/DOSE AEPB Inhale 1 puff into the lungs every 12 (twelve) hours.             albuterol-ipratropium (COMBIVENT) 18-103 MCG/ACT inhaler Inhale 2 puffs into the lungs 4 (four) times daily.             clopidogrel (PLAVIX)  75 MG tablet Take 75 mg by mouth daily.             montelukast (SINGULAIR) 10 MG tablet Take 10 mg by mouth at bedtime.             losartan (COZAAR) 50 MG tablet Take 50 mg by mouth daily.             metFORMIN (GLUCOPHAGE) 1000 MG tablet Take 1,000 mg by mouth 2 (two) times daily with a meal.             moxifloxacin (AVELOX) 400 MG tablet Take 1 tablet (400 mg total) by mouth daily at 6 PM.           predniSONE (DELTASONE) 10 MG tablet Take 1-3 tablets (10-30 mg total) by mouth daily. Take 3 tablets each day for 3 days; then take 2 tablets each day for 3 days; then take 1 tablet each day for 3 days               Tana Conch, MD of [facility] 02/10/2011 12:09 AM

## 2011-02-08 NOTE — Progress Notes (Signed)
PGY-1 Daily Progress Note Addendum Family Medicine Teaching Service Judith Boyer. Judith Sleigh, MD Service Pager: [phone]  S: Patient reports feeling "funny" at rest as well as slightly clammy/warm. She reports feeling dizzy when getting up and walking. She has never felt this bad with a COPD exacerbation.   O: Walked patient throughout room without oxygen. Maintained sats >88%.  Of note: Patient reported extreme dyspnea during this time.   A/P: 66 year old female with COPD and presumed COPD exacerbation found to have severe hypokinesis of inferoposterior septum and continued dyspnea despite no hypoxia and appropriate COPD treatment.  -I have called Dr. Algie Coffer to have patient evaluated from a cardiology perspective given dyspnea out of proportion to hypoxia.  -will delay discharge until evaluation by cardiology  Tana Conch, MD, PGY1 02/08/2011 2:26 PM

## 2011-02-08 NOTE — Progress Notes (Signed)
PGY-1 Daily Progress Note Family Medicine Teaching Service Judith Boyer. Judith Sleigh, MD Service Pager: [phone]   Subjective: Patient says SOB much improved from time of admission. States stable from yesterday even with decrease from 2.5 L to 2L [place]. SOB when up brushing teeth. Describes a mild chest tightness associated with the SOB.   Objective:  Temp:  [97.6 F (36.4 C)-97.8 F (36.6 C)] 97.6 F (36.4 C) (01/03 0508) Pulse Rate:  [78-96] 78  (01/03 0508) Resp:  [18-20] 20  (01/03 0508) BP: (104-134)/(67-74) 134/67 mmHg (01/03 0508) SpO2:  [92 %-98 %] 96 % (01/03 0837) now weaned from 2.5 to 2L   Intake/Output Summary (Last 24 hours) at 02/08/11 1018 Last data filed at 02/07/11 2233  Gross per 24 hour  Intake    960 ml  Output      0 ml  Net    960 ml    Gen:  NAD at rest. I witnessed patient after she stood to brush her teeth. She was on oxygen and sats were >91% but she seemed very short of breath out of proportion to oxygen level.  CV: Regular rate and rhythm, no murmurs rubs or gallops PULM: clear to auscultation bilaterally. No wheezes/rales/rhonchi. Prolonged expiratory phase. No increased work of breathing at rest.  ABD: soft/nontender/nondistended/normal bowel sounds EXT: No edema Neuro: Alert and oriented x3. Grossly intact.   Labs and imaging:   CBC  Lab 02/08/11 0505 02/06/11 0540 02/04/11 1246  WBC 10.1 8.5 10.7*  HGB 11.7* 12.3 12.0  HCT 37.1 38.2 36.6  PLT 326 302 281   BMET  Lab 02/08/11 0505 02/06/11 0540 02/05/11 0300  NA 140 140 137  K 4.0 3.6 3.7  CL 99 98 99  CO2 32 36* 30  BUN 16 13 12   CREATININE 0.61 0.59 0.51  LABGLOM -- -- --  GLUCOSE 109* -- --  CALCIUM 8.8 9.2 9.0   Results for orders placed during the hospital encounter of 02/04/11 (from the past 24 hour(s))  GLUCOSE, CAPILLARY     Status: Abnormal   Collection Time   02/07/11 11:55 AM      Component Value Range   Glucose-Capillary 273 (*) 70 - 99 (mg/dL)  GLUCOSE, CAPILLARY      Status: Abnormal   Collection Time   02/07/11  4:40 PM      Component Value Range   Glucose-Capillary 154 (*) 70 - 99 (mg/dL)   Comment 1 Documented in Chart     Comment 2 Notify RN    GLUCOSE, CAPILLARY     Status: Normal   Collection Time   02/07/11  9:42 PM      Component Value Range   Glucose-Capillary 89  70 - 99 (mg/dL)   Comment 1 Documented in Chart     Comment 2 Notify RN    BASIC METABOLIC PANEL     Status: Abnormal   Collection Time   02/08/11  5:05 AM      Component Value Range   Sodium 140  135 - 145 (mEq/L)   Potassium 4.0  3.5 - 5.1 (mEq/L)   Chloride 99  96 - 112 (mEq/L)   CO2 32  19 - 32 (mEq/L)   Glucose, Bld 109 (*) 70 - 99 (mg/dL)   BUN 16  6 - 23 (mg/dL)   Creatinine, Ser 4.54  0.50 - 1.10 (mg/dL)   Calcium 8.8  8.4 - 09.8 (mg/dL)   GFR calc non Af Amer >90  >90 (mL/min)  GFR calc Af Amer >90  >90 (mL/min)  CBC     Status: Abnormal   Collection Time   02/08/11  5:05 AM      Component Value Range   WBC 10.1  4.0 - 10.5 (K/uL)   RBC 4.20  3.87 - 5.11 (MIL/uL)   Hemoglobin 11.7 (*) 12.0 - 15.0 (g/dL)   HCT 47.8  29.5 - 62.1 (%)   MCV 88.3  78.0 - 100.0 (fL)   MCH 27.9  26.0 - 34.0 (pg)   MCHC 31.5  30.0 - 36.0 (g/dL)   RDW 30.8  65.7 - 84.6 (%)   Platelets 326  150 - 400 (K/uL)  GLUCOSE, CAPILLARY     Status: Abnormal   Collection Time   02/08/11  5:45 AM      Component Value Range   Glucose-Capillary 130 (*) 70 - 99 (mg/dL)   No results found.   Assessment  66 yo F with COPD presents with SOB, being treated as COPD exacerbation:   1. COPD exacerbation:  On Avelox and Prednisone. q6 albuterol and ipratropium with no prn. Also on singulair. Also on home advair. Overall she is slowly improving since admit. CXR showed small effusions, no evidence of PNA, no fevers.  -patient's dyspnea seems out of proportion to hypoxia. Patient was reportedly walked by Dr. Deirdre Priest who mentioned sats >92% but patient reporting profound hypoxia.  -will walk patient without  oxygen. If able to maintain sats and still hypoxic, will consider cardiology consult. Given problem #2.   2.   History of CAD-Plavix, ASA. -troponins negative x3. CK, MB elevated from 6.6-7.8.  Echo results:  Showed mildly decreased EF of 50 - 55% as well as dsevere hypokinesis of the mid-distal inferoseptal Myocardium.  - Sees Dr. Algie Coffer for cardiology, last visit was several years ago. Will attempt to touch base with cardiologist.  EKG shows Borderline Q-waves in leads II, III, AVF which would be consistent with Echo findings.  Denies knowledge of any past MI.  No acute symptoms at this time, do not believe she is experiencing acute heart failure based on exam and equivocal BNP of 571.    3. HTN:  Well controlled On home ARB.    4. Diabetes: Creatinine normal. Patient eating most of her meals.  On half of home Metformin dose.  well controlled in house except for outlier of 273 yesterday, every other value less than 154.   5.  Smoking cessation:  Patient interested, will place consult. Patient states she will not smoke anymore after this hospitalization.    6.  Hx/o sinus infection:  Should be covered by Avelox.  Was being treated prior to admission, unable to remember Abx name.    7. FEN/GI: electrolytes WNL. Continue carb controlled diet.   8. DVT PPX: continue SQ heparin.   9. Dispo: to home pending clinical improvement and decision on cardiology consult.  Would consider PFTs and singular or other COPD medication outpatient.    Tana Conch, MD PGY1, Family Medicine Teaching Service [phone]

## 2011-02-08 NOTE — Progress Notes (Signed)
Family Medicine Teaching Service Attending Note  I interviewed and examined patient Judith Boyer and reviewed their tests and x-rays.  I discussed with Dr. Durene Cal and reviewed their note for today.  I agree with their assessment and plan.     Additionally  Able to stand and walk about 30 feet but complains of dyspnea her sats remained > 92% on 2 liters. Recommend walking without O2.  If dyspnea does not correlate with O2 sat then would consult cardiology since her symptoms may be more cardiac dyspnea than hypoxia.

## 2011-02-08 NOTE — Consult Note (Signed)
Judith Boyer is an 66 y.o. female.   Chief Complaint: Exertional dyspnea HPI: 66 years old white female with known CAD and RCA stent in 2009 has progressive worsening of shortness of breath x 7-10 days and chest pressure. Her condition is not improving as expected with medical therapy. She has multiple cardiac risk factors also.   Past Medical History  Diagnosis Date  . Myocardial infarction   . COPD (chronic obstructive pulmonary disease)   . Hypertension       Past Surgical History  Procedure Date  . Abdominal hysterectomy   . Shoulder surgery   . Coronary stent placement 12/2007    History reviewed. No pertinent family history. Social History:  reports that she has been smoking.  She does not have any smokeless tobacco history on file. She reports that she does not drink alcohol or use illicit drugs.  Allergies:  Allergies  Allergen Reactions  . Codeine   . Doxycycline   . Lisinopril     Medications Prior to Admission  Medication Dose Route Frequency Provider Last Rate Last Dose  . 0.9 %  sodium chloride infusion  250 mL Intravenous PRN Dawn Caviness      . 0.9 %  sodium chloride infusion  250 mL Intravenous PRN Ricki Rodriguez, MD      . acetaminophen (TYLENOL) tablet 650 mg  650 mg Oral Q6H PRN Dayarmys Piloto, MD   650 mg at 02/08/11 2217  . albuterol (PROVENTIL) (5 MG/ML) 0.5% nebulizer solution 2.5 mg  2.5 mg Nebulization Q2H PRN Dawn Caviness   2.5 mg at 02/05/11 2236  . albuterol (PROVENTIL) (5 MG/ML) 0.5% nebulizer solution 2.5 mg  2.5 mg Nebulization Q6H Wayne Andrew Hale   2.5 mg at 02/08/11 2032  . albuterol (PROVENTIL) (5 MG/ML) 0.5% nebulizer solution 5 mg  5 mg Nebulization Once Cyndra Numbers, MD   5 mg at 02/04/11 1236  . albuterol (PROVENTIL) (5 MG/ML) 0.5% nebulizer solution 5 mg  5 mg Nebulization Once Cyndra Numbers, MD   5 mg at 02/04/11 1328  . albuterol-ipratropium (COMBIVENT) inhaler 2 puff  2 puff Inhalation Q4H PRN Shelly Flatten, MD      .  clopidogrel (PLAVIX) tablet 75 mg  75 mg Oral Daily Dawn Caviness   75 mg at 02/08/11 0953  . Fluticasone-Salmeterol (ADVAIR) 500-50 MCG/DOSE inhaler 1 puff  1 puff Inhalation Q12H Dawn Caviness   1 puff at 02/08/11 2032  . guaiFENesin (MUCINEX) 12 hr tablet 1,200 mg  1,200 mg Oral BID PRN Josalyn Funches      . heparin injection 5,000 Units  5,000 Units Subcutaneous Q8H Dawn Caviness   5,000 Units at 02/08/11 2214  . insulin aspart (novoLOG) injection 0-9 Units  0-9 Units Subcutaneous TID WC Dayarmys Piloto, MD   2 Units at 02/08/11 1651  . iohexol (OMNIPAQUE) 300 MG/ML solution 100 mL  100 mL Intravenous Once PRN Medication Radiologist      . ipratropium (ATROVENT) nebulizer solution 0.5 mg  0.5 mg Nebulization Once Cyndra Numbers, MD   0.5 mg at 02/04/11 1237  . ipratropium (ATROVENT) nebulizer solution 0.5 mg  0.5 mg Nebulization Q6H Dawn Caviness   0.5 mg at 02/08/11 2032  . LORazepam (ATIVAN) tablet 0.5 mg  0.5 mg Oral Once Cyndra Numbers, MD   0.5 mg at 02/04/11 1444  . losartan (COZAAR) tablet 50 mg  50 mg Oral Daily Dawn Caviness   50 mg at 02/08/11 0953  . metFORMIN (GLUCOPHAGE) tablet 500 mg  500 mg Oral BID WC Josalyn Funches   500 mg at 02/08/11 1651  . montelukast (SINGULAIR) tablet 10 mg  10 mg Oral QHS Dawn Caviness   10 mg at 02/08/11 2214  . moxifloxacin (AVELOX) tablet 400 mg  400 mg Oral q1800 Dawn Caviness   400 mg at 02/08/11 1812  . Petrolatum OINT 1 application  1 application Apply externally PRN Laurena Bering, PHARMD      . predniSONE (DELTASONE) tablet 50 mg  50 mg Oral Q breakfast Dawn Caviness   50 mg at 02/08/11 0731  . sodium chloride 0.9 % bolus 500 mL  500 mL Intravenous Once Cyndra Numbers, MD   500 mL at 02/04/11 1559  . sodium chloride 0.9 % injection 3 mL  3 mL Intravenous Q12H Dawn Caviness   3 mL at 02/08/11 2215  . sodium chloride 0.9 % injection 3 mL  3 mL Intravenous PRN Dawn Caviness   3 mL at 02/07/11 2306  . sodium chloride 0.9 % injection 3 mL  3 mL  Intravenous Q12H Ricki Rodriguez, MD      . sodium chloride 0.9 % injection 3 mL  3 mL Intravenous PRN Ricki Rodriguez, MD      . DISCONTD: albuterol (PROVENTIL) (5 MG/ML) 0.5% nebulizer solution 2.5 mg  2.5 mg Nebulization Q4H Dawn Caviness   2.5 mg at 02/05/11 0910   No current outpatient prescriptions on file as of 02/08/2011.    Results for orders placed during the hospital encounter of 02/04/11 (from the past 48 hour(s))  GLUCOSE, CAPILLARY     Status: Abnormal   Collection Time   02/07/11  5:52 AM      Component Value Range Comment   Glucose-Capillary 119 (*) 70 - 99 (mg/dL)   GLUCOSE, CAPILLARY     Status: Abnormal   Collection Time   02/07/11 11:55 AM      Component Value Range Comment   Glucose-Capillary 273 (*) 70 - 99 (mg/dL)   GLUCOSE, CAPILLARY     Status: Abnormal   Collection Time   02/07/11  4:40 PM      Component Value Range Comment   Glucose-Capillary 154 (*) 70 - 99 (mg/dL)    Comment 1 Documented in Chart      Comment 2 Notify RN     GLUCOSE, CAPILLARY     Status: Normal   Collection Time   02/07/11  9:42 PM      Component Value Range Comment   Glucose-Capillary 89  70 - 99 (mg/dL)    Comment 1 Documented in Chart      Comment 2 Notify RN     BASIC METABOLIC PANEL     Status: Abnormal   Collection Time   02/08/11  5:05 AM      Component Value Range Comment   Sodium 140  135 - 145 (mEq/L)    Potassium 4.0  3.5 - 5.1 (mEq/L)    Chloride 99  96 - 112 (mEq/L)    CO2 32  19 - 32 (mEq/L)    Glucose, Bld 109 (*) 70 - 99 (mg/dL)    BUN 16  6 - 23 (mg/dL)    Creatinine, Ser 0.45  0.50 - 1.10 (mg/dL)    Calcium 8.8  8.4 - 10.5 (mg/dL)    GFR calc non Af Amer >90  >90 (mL/min)    GFR calc Af Amer >90  >90 (mL/min)   CBC     Status: Abnormal  Collection Time   02/08/11  5:05 AM      Component Value Range Comment   WBC 10.1  4.0 - 10.5 (K/uL)    RBC 4.20  3.87 - 5.11 (MIL/uL)    Hemoglobin 11.7 (*) 12.0 - 15.0 (g/dL)    HCT 16.1  09.6 - 04.5 (%)    MCV 88.3  78.0 - 100.0  (fL)    MCH 27.9  26.0 - 34.0 (pg)    MCHC 31.5  30.0 - 36.0 (g/dL)    RDW 40.9  81.1 - 91.4 (%)    Platelets 326  150 - 400 (K/uL)   GLUCOSE, CAPILLARY     Status: Abnormal   Collection Time   02/08/11  5:45 AM      Component Value Range Comment   Glucose-Capillary 130 (*) 70 - 99 (mg/dL)   GLUCOSE, CAPILLARY     Status: Abnormal   Collection Time   02/08/11 11:09 AM      Component Value Range Comment   Glucose-Capillary 205 (*) 70 - 99 (mg/dL)   GLUCOSE, CAPILLARY     Status: Abnormal   Collection Time   02/08/11  4:31 PM      Component Value Range Comment   Glucose-Capillary 198 (*) 70 - 99 (mg/dL)    Comment 1 Documented in Chart      Comment 2 Notify RN      No results found.    Blood pressure 113/68, pulse 90, temperature 97.9 F (36.6 C), temperature source Oral, resp. rate 18, height 5' (1.524 m), weight 53.2 kg (117 lb 4.6 oz), SpO2 92.00%. Gen: Short and averagely nourished in moderate respiratory distress.  HEENT: [place]/AT, Conj-pink, Sclera-white. Moist mucous membrane. Neck:- No JVD, + accessory muscle use.  Lungs: Decreased breath sounds without wheezing. Heart: S1, S2 normal. II/VI systolic murmur. Abdomen: Soft, non-tender. Ext: 2 + femoral pulses.  CNS:- A x O x 3. Moves all 4 ext. Skin: Warm and dry.  Assessment Cheat Pain rule out CAD COPD DM, II Hypertension  Plan: C. Cath in AM. Patient understood risks, benefit and alternatives.  Nathalie Cavendish S 02/08/2011, 10:44 PM

## 2011-02-09 ENCOUNTER — Other Ambulatory Visit: Payer: Self-pay

## 2011-02-09 ENCOUNTER — Encounter (HOSPITAL_COMMUNITY): Payer: Self-pay | Admitting: Cardiovascular Disease

## 2011-02-09 ENCOUNTER — Encounter (HOSPITAL_COMMUNITY)
Admission: EM | Disposition: A | Discharge status: Home / Self Care | Payer: Self-pay | Source: Home / Self Care | Attending: Family Medicine

## 2011-02-09 DIAGNOSIS — R079 Chest pain, unspecified: Secondary | ICD-10-CM | POA: Diagnosis present

## 2011-02-09 DIAGNOSIS — I2510 Atherosclerotic heart disease of native coronary artery without angina pectoris: Secondary | ICD-10-CM | POA: Diagnosis present

## 2011-02-09 HISTORY — PX: LEFT HEART CATHETERIZATION WITH CORONARY ANGIOGRAM: SHX5451

## 2011-02-09 LAB — CBC
HCT: 36.2 % (ref 36.0–46.0)
Hemoglobin: 11.7 g/dL — ABNORMAL LOW (ref 12.0–15.0)
MCH: 27.8 pg (ref 26.0–34.0)
MCHC: 32.3 g/dL (ref 30.0–36.0)
MCV: 86 fL (ref 78.0–100.0)
RBC: 4.21 MIL/uL (ref 3.87–5.11)

## 2011-02-09 LAB — BASIC METABOLIC PANEL
BUN: 13 mg/dL (ref 6–23)
CO2: 30 mEq/L (ref 19–32)
Calcium: 9 mg/dL (ref 8.4–10.5)
Chloride: 99 mEq/L (ref 96–112)
Creatinine, Ser: 0.55 mg/dL (ref 0.50–1.10)
Glucose, Bld: 112 mg/dL — ABNORMAL HIGH (ref 70–99)

## 2011-02-09 LAB — PLATELET INHIBITION P2Y12: Platelet Function  P2Y12: 180 [PRU] — ABNORMAL LOW (ref 194–418)

## 2011-02-09 SURGERY — LEFT HEART CATHETERIZATION WITH CORONARY ANGIOGRAM
Anesthesia: LOCAL

## 2011-02-09 MED ORDER — NITROGLYCERIN 0.2 MG/ML ON CALL CATH LAB
INTRAVENOUS | Status: AC
  Filled 2011-02-09: qty 1

## 2011-02-09 MED ORDER — ASPIRIN 81 MG PO CHEW
324.0000 mg | CHEWABLE_TABLET | ORAL | Status: AC
  Administered 2011-02-09: 324 mg via ORAL
  Filled 2011-02-09: qty 4

## 2011-02-09 MED ORDER — FENTANYL CITRATE 0.05 MG/ML IJ SOLN
INTRAMUSCULAR | Status: AC
  Filled 2011-02-09: qty 2

## 2011-02-09 MED ORDER — SODIUM CHLORIDE 0.9 % IV SOLN
INTRAVENOUS | Status: DC
  Administered 2011-02-09: 05:00:00 via INTRAVENOUS

## 2011-02-09 MED ORDER — SODIUM CHLORIDE 0.9 % IV SOLN: 1.0000 mL/kg/h | INTRAVENOUS | Status: DC

## 2011-02-09 MED ORDER — PREDNISONE 10 MG PO TABS: 10.0000 mg | ORAL_TABLET | Freq: Every day | ORAL | Status: DC

## 2011-02-09 MED ORDER — GUAIFENESIN 100 MG/5ML PO SOLN
5.0000 mL | ORAL | Status: DC | PRN
  Administered 2011-02-09: 100 mg via ORAL
  Filled 2011-02-09: qty 5

## 2011-02-09 MED ORDER — HEPARIN (PORCINE) IN NACL 2-0.9 UNIT/ML-% IJ SOLN
INTRAMUSCULAR | Status: AC
  Filled 2011-02-09: qty 2000

## 2011-02-09 MED ORDER — SODIUM CHLORIDE 0.9 % IV SOLN: INTRAVENOUS | Status: DC

## 2011-02-09 MED ORDER — LIDOCAINE HCL (PF) 1 % IJ SOLN
INTRAMUSCULAR | Status: AC
  Filled 2011-02-09: qty 30

## 2011-02-09 MED ORDER — DIAZEPAM 5 MG PO TABS
5.0000 mg | ORAL_TABLET | ORAL | Status: AC
  Administered 2011-02-09: 5 mg via ORAL
  Filled 2011-02-09 (×2): qty 1

## 2011-02-09 MED ORDER — MOXIFLOXACIN HCL 400 MG PO TABS: 400.0000 mg | ORAL_TABLET | Freq: Every day | ORAL | Status: AC

## 2011-02-09 MED ORDER — MIDAZOLAM HCL 2 MG/2ML IJ SOLN
INTRAMUSCULAR | Status: AC
  Filled 2011-02-09: qty 2

## 2011-02-09 NOTE — Progress Notes (Signed)
Pt discharged with son and daughter-in-law.  Vitals stable.  Denies pain.  Pt adequate Sp02 on Room Air.  R groin site CDI.  Pulse RLE 1+.  Denies numbness and tingling.  Pt has all belongings.  Discussed discharge instructions.  Verbalized understanding.  No other concerns at this time.  Barrie Lyme 6:20 PM 02/09/2011

## 2011-02-09 NOTE — Progress Notes (Signed)
Subjective:  Feeling little better. Improving O2 saturation. Blood work stable.  Objective:  Vital Signs in the last 24 hours: Temp:  [97.8 F (36.6 C)-98.1 F (36.7 C)] 97.8 F (36.6 C) (01/04 0433) Pulse Rate:  [90-99] 99  (01/04 0433) Cardiac Rhythm:  [-] Normal sinus rhythm;Sinus tachycardia (01/03 2200) Resp:  [18] 18  (01/04 0433) BP: (113-145)/(68-76) 145/72 mmHg (01/04 0433) SpO2:  [92 %-96 %] 94 % (01/04 0433) Weight:  [51.483 kg (113 lb 8 oz)] 113 lb 8 oz (51.483 kg) (01/04 0500)  Physical Exam: BP Readings from Last 1 Encounters:  02/09/11 145/72    Wt Readings from Last 1 Encounters:  02/09/11 51.483 kg (113 lb 8 oz)    Weight change:   HEENT: [place]/AT, Eyes- PERL, EOMI, Conjunctiva-Pink, Sclera-Non-icteric Neck: No JVD, No bruit, Trachea midline. Lungs:  Clear, Bilateral. Cardiac:  Regular rhythm, normal S1 and S2, II/VI systolic murmur.  Abdomen:  Soft, non-tender. Extremities:  No edema present. No cyanosis. No clubbing. CNS: AxOx3, Cranial nerves grossly intact, moves all 4 extremities. Right handed. Skin: Warm and dry.   Intake/Output from previous day: 01/03 0701 - 01/04 0700 In: 726 [P.O.:720; I.V.:6] Out: 250 [Urine:250]    Lab Results: BMET    Component Value Date/Time   NA 136 02/09/2011 0505   K 3.8 02/09/2011 0505   CL 99 02/09/2011 0505   CO2 30 02/09/2011 0505   GLUCOSE 112* 02/09/2011 0505   BUN 13 02/09/2011 0505   CREATININE 0.55 02/09/2011 0505   CALCIUM 9.0 02/09/2011 0505   GFRNONAA >90 02/09/2011 0505   GFRAA >90 02/09/2011 0505   CBC    Component Value Date/Time   WBC 11.3* 02/09/2011 0505   RBC 4.21 02/09/2011 0505   HGB 11.7* 02/09/2011 0505   HCT 36.2 02/09/2011 0505   PLT 352 02/09/2011 0505   MCV 86.0 02/09/2011 0505   MCH 27.8 02/09/2011 0505   MCHC 32.3 02/09/2011 0505   RDW 13.6 02/09/2011 0505   LYMPHSABS 2.3 01/16/2008 1130   MONOABS 0.5 01/16/2008 1130   EOSABS 0.0 01/16/2008 1130   BASOSABS 0.0 01/16/2008 1130   CARDIAC ENZYMES Lab Results    Component Value Date   CKTOTAL 144 02/05/2011   CKMB 7.2* 02/05/2011   TROPONINI <0.30 02/05/2011   Assessment  Cheat Pain rule out CAD  COPD  DM, II  Hypertension   Plan:  C. Cath in today.  Patient understood risks, benefit and alternatives.     LOS: 5 days    Orpah Cobb  MD  02/09/2011, 7:26 AM

## 2011-02-09 NOTE — Op Note (Signed)
PROCEDURE:  Left heart catheterization with selective coronary angiography, left ventriculogram.  CLINICAL HISTORY:  This is a 66 years old white female with known CAD and stent in right coronary artery in 12/2007 had chest pain, shortness of breath with and without activity. She also has hypertension, DM, II and tobacco use disorder.    The risks, benefits, and details of the procedure were explained to the patient.  The patient verbalized understanding and wanted to proceed.  Informed written consent was obtained.  PROCEDURE TECHNIQUE:  The patient was approached from the right femoral artery using a 5 French short sheath.  Left coronary angiography was done using a Judkins L4 guide catheter.  Right coronary angiography was done using a Judkins R4 guide catheter.  Left ventriculography was done using a pigtail catheter.    CONTRAST:  Total of 45 cc.  COMPLICATIONS:  None.  At the end of the procedure a manual pressure was used for hemostasis.    HEMODYNAMICS:  Aortic pressure was 162/72; LV pressure was 166/13; LVEDP 15.  There was no gradient between the left ventricle and aorta.    ANGIOGRAM/CORONARY ARTERIOGRAM:   The left main coronary artery is calcific otherwise unremarkable..  The left anterior descending artery is has proximal mild calcification and luminal irregularities. Mid and distal vessel is diffusely narrow. Diagonal is unremarkable.  The left circumflex artery is has proximal luminal irreuglarities. OM brnach has proximal 20 % eccentric, followed by 60 % eccentric lesion with good distal flow. Distal vessel had luminal irregularities.  The right coronary artery is dominant and proximal and mid vessel stents are patent. Had mid vessel minimal disease. PDA is small with osteal 40-50 % narrowing and mild diffuse disease but vessel is less than 1.5 mm in size.   LEFT VENTRICULOGRAM:  Left ventricular angiogram was done in the 30 RAO projection and revealed normal left ventricular  wall motion and systolic function with an estimated ejection fraction of 60%.  LVEDP was 15 mmHg.  IMPRESSION OF HEART CATHETERIZATION:   1. Normal left main coronary artery. 2. Mild left anterior descending artery disease. 3. Moderate OM/left circumflex artery disease. 4. Mild right coronary artery with patent proximal and mid stents. Very small Posterior descending artery with mild to moderate disease 5. Normal left ventricular systolic function.  LVEDP 15 mmHg.  Ejection fraction 60%.  RECOMMENDATION:   Medical treatment with life-style modification.

## 2011-02-09 NOTE — Progress Notes (Signed)
Spoke with Dr. Algie Coffer on phone.  Informed of weakened pedal pulse on RLE that is now only found via dopplar.  Pulse had been present at 1+ until this moment.  Extremity appears cooler than at prior assessment.  LLE pulse is strong and extremity is warm to touch.  Dr. Algie Coffer stated he would visit patient soon.  No other concerns at this time.  Patient resting comfortably eating lunch, continues to be on bedrest until 1330.  Will continue to monitor.  Barrie Lyme 12:31 PM 02/09/2011

## 2011-02-11 NOTE — Discharge Summary (Signed)
I have reviewed this discharge summary and agree.    

## 2011-02-19 ENCOUNTER — Ambulatory Visit
Admission: RE | Admit: 2011-02-19 | Discharge: 2011-02-19 | Disposition: A | Discharge status: Home / Self Care | Payer: Medicare Other | Source: Ambulatory Visit | Attending: Cardiovascular Disease | Admitting: Cardiovascular Disease

## 2011-02-19 ENCOUNTER — Other Ambulatory Visit: Payer: Self-pay | Admitting: Cardiovascular Disease

## 2011-02-19 DIAGNOSIS — R200 Anesthesia of skin: Secondary | ICD-10-CM

## 2011-02-19 DIAGNOSIS — R252 Cramp and spasm: Secondary | ICD-10-CM

## 2011-02-27 ENCOUNTER — Ambulatory Visit: Payer: Self-pay | Admitting: Emergency Medicine

## 2011-04-03 ENCOUNTER — Ambulatory Visit (INDEPENDENT_AMBULATORY_CARE_PROVIDER_SITE_OTHER): Payer: Medicare Other | Admitting: Internal Medicine

## 2011-04-03 DIAGNOSIS — R300 Dysuria: Secondary | ICD-10-CM

## 2011-04-03 DIAGNOSIS — R21 Rash and other nonspecific skin eruption: Secondary | ICD-10-CM

## 2011-04-03 DIAGNOSIS — R062 Wheezing: Secondary | ICD-10-CM

## 2011-04-03 LAB — POCT SKIN KOH: Skin KOH, POC: NEGATIVE

## 2011-04-03 LAB — POCT URINALYSIS DIPSTICK
Bilirubin, UA: NEGATIVE
Glucose, UA: NEGATIVE
Leukocytes, UA: NEGATIVE
Nitrite, UA: NEGATIVE
pH, UA: 6

## 2011-04-03 LAB — POCT UA - MICROSCOPIC ONLY
Bacteria, U Microscopic: NEGATIVE
Casts, Ur, LPF, POC: NEGATIVE

## 2011-04-03 MED ORDER — FLUCONAZOLE 100 MG PO TABS: 100.0000 mg | ORAL_TABLET | Freq: Every day | ORAL | Status: AC

## 2011-04-03 MED ORDER — CLOTRIMAZOLE-BETAMETHASONE 1-0.05 % EX CREA: TOPICAL_CREAM | Freq: Two times a day (BID) | CUTANEOUS | Status: DC

## 2011-04-03 MED ORDER — IPRATROPIUM-ALBUTEROL 18-103 MCG/ACT IN AERO: 2.0000 | INHALATION_SPRAY | Freq: Four times a day (QID) | RESPIRATORY_TRACT | Status: DC

## 2011-04-03 NOTE — Progress Notes (Signed)
  Subjective:    Patient ID: Judith Boyer, female    DOB: Jan 11, 1946, 66 y.o.   MRN: [id number]  HPI Pt presents to clinic with rash under L breast for the last several weeks.  It itches and burns and hurts, esp with hot water touching it.  She has DM but her sugars are average of 100.  She has not had recent illnesses.  She has tried to change detergents and soaps but no change in the rash, she had not changed any of these things prior to the rash development.  She has used no cream on the rash to help.  This am she noticed some dysuria and vaginal irritation.  Pt is also concerned because she is having a hard time getting her combivent and worries about her lungs without it.  (it appears that combivent is only going to be available until July and then it will become brand only due to formulation change.)  Review of Systems  Constitutional: Negative for fever and chills.  Genitourinary: Positive for dysuria. Negative for vaginal discharge.  Skin: Positive for rash.       Objective:   Physical Exam  Constitutional: She is oriented to person, place, and time. She appears well-developed and well-nourished.  HENT:  Head: Normocephalic and atraumatic.  Right Ear: External ear normal.  Left Ear: External ear normal.  Eyes: Conjunctivae are normal.  Pulmonary/Chest: Effort normal.  Neurological: She is alert and oriented to person, place, and time.  Skin: Skin is warm.     Psychiatric: She has a normal mood and affect. Her behavior is normal. Judgment and thought content normal.          Assessment & Plan:   1. Dysuria  POCT urinalysis dipstick, POCT UA - Microscopic Only  2. Rash  POCT Skin KOH, fluconazole (DIFLUCAN) 100 MG tablet, clotrimazole-betamethasone (LOTRISONE) cream  3. Wheezing  albuterol-ipratropium (COMBIVENT) 18-103 MCG/ACT inhaler

## 2011-06-27 ENCOUNTER — Telehealth: Payer: Self-pay

## 2011-06-27 NOTE — Telephone Encounter (Signed)
Dr. Cleta Alberts wanted her to have new kind of inhaler - COMBIVENT - but pharmacy has faxed for approval from [place] and hasn't received anything. Please contact pharmacy or patient.

## 2011-06-28 NOTE — Telephone Encounter (Signed)
Called pharm and clarified that pt just needs RF on her Combivent and pharm was going to try to send back through [facility]. Did not receive through surescripts and called pharm back and LM on MD VM that Rx written 04/03/11 had 5 RFs. Left OK for #1 plus 3 add'l RFs if they do not have the orig Rx on file.

## 2011-06-28 NOTE — Telephone Encounter (Signed)
CB pt to let her know RFs done and she said that wasn't the problem, she had RFs, but they are not making the same Combivent any more pharm either needs a new Rx for new type, or maybe a PA.Jeanene Erb pharmacist back and they checked and said they need OK to change to the new Combivent Respimat. Gave OK to change the existing RFs over to the new Rx. He said they don't have in stock but have ordered one yesterday and will try to get one from another [facility] since pt leaving town in [place]. Let pt know all of above and she will call pharm later to see if they have gotten one.

## 2011-07-25 ENCOUNTER — Ambulatory Visit (INDEPENDENT_AMBULATORY_CARE_PROVIDER_SITE_OTHER): Payer: Medicare Other | Admitting: Emergency Medicine

## 2011-07-25 VITALS — BP 122/65 | HR 94 | Temp 98.0°F | Resp 18 | Ht 60.5 in | Wt 113.0 lb

## 2011-07-25 DIAGNOSIS — R062 Wheezing: Secondary | ICD-10-CM

## 2011-07-25 MED ORDER — FLUTICASONE-SALMETEROL 500-50 MCG/DOSE IN AEPB: 1.0000 | INHALATION_SPRAY | Freq: Two times a day (BID) | RESPIRATORY_TRACT | Status: DC

## 2011-07-25 MED ORDER — IPRATROPIUM-ALBUTEROL 18-103 MCG/ACT IN AERO: 2.0000 | INHALATION_SPRAY | Freq: Four times a day (QID) | RESPIRATORY_TRACT | Status: DC

## 2011-07-25 NOTE — Progress Notes (Signed)
  Subjective:    Patient ID: Judith Boyer, female    DOB: [birth year], 66 y.o.   MRN: [id number]  HPI patient has been under quite a bit of stress recently a loss of her mother. Her respiratory function has been okay. She overall is doing well    Review of Systems she has not had any episodes increasing cough or shortness of breath. She's been using both of her inhalers to     Objective:   Physical Exam HEENT exam is unremarkable. Neck supple. Chest is clear she has marked increased AP diameter with limited breath sounds in the bases.        Assessment & Plan:  COPD is stable at the present time on current medication. Meds were refilled.

## 2011-08-03 ENCOUNTER — Telehealth: Payer: Self-pay

## 2011-08-03 NOTE — Telephone Encounter (Signed)
PT STATES HER PHARMACY CALLED FOR A REFILL ON A NEBULIZER AND IT WAS DENIED, SHE DOESN'T UNDERSTAND WHY SINCE SHE WAS JUST IN LAST WEEK PLEASE CALL [phone]   [facility] [address]

## 2011-08-03 NOTE — Telephone Encounter (Signed)
The patient called again to inquire as to status of refill of nebulizer that was denied and to make sure that message was taken previously.  The patient is requesting return call at [phone].  Please call patient to resolve situation.

## 2011-08-04 ENCOUNTER — Telehealth: Payer: Self-pay

## 2011-08-04 MED ORDER — IPRATROPIUM-ALBUTEROL 0.5-2.5 (3) MG/3ML IN SOLN: 3.0000 mL | Freq: Four times a day (QID) | RESPIRATORY_TRACT | Status: DC | PRN

## 2011-08-04 NOTE — Telephone Encounter (Signed)
Pt is checking on refill request that was requested yesterday I added this message to that message and accidentally clicked close encounter

## 2011-08-04 NOTE — Telephone Encounter (Signed)
Spoke with patient--needing refills on her Duoneb-- states it was denied...  Ok per Dr. Cleta Alberts to refill through August appt.  Patient notified.

## 2011-08-04 NOTE — Telephone Encounter (Signed)
Pt called to check on status of the request and to make sure that dr Cleta Alberts will call her back

## 2011-08-04 NOTE — Addendum Note (Signed)
Addended by: Jacqualyn Posey on: 08/04/2011 11:21 AM   Modules accepted: Orders

## 2011-10-10 ENCOUNTER — Ambulatory Visit (INDEPENDENT_AMBULATORY_CARE_PROVIDER_SITE_OTHER): Payer: Medicare Other | Admitting: Emergency Medicine

## 2011-10-10 VITALS — BP 142/62 | HR 94 | Temp 97.9°F | Resp 16 | Ht 61.5 in | Wt 113.6 lb

## 2011-10-10 DIAGNOSIS — J449 Chronic obstructive pulmonary disease, unspecified: Secondary | ICD-10-CM

## 2011-10-10 DIAGNOSIS — J4489 Other specified chronic obstructive pulmonary disease: Secondary | ICD-10-CM

## 2011-10-10 DIAGNOSIS — E119 Type 2 diabetes mellitus without complications: Secondary | ICD-10-CM

## 2011-10-10 DIAGNOSIS — Z23 Encounter for immunization: Secondary | ICD-10-CM

## 2011-10-10 DIAGNOSIS — R062 Wheezing: Secondary | ICD-10-CM

## 2011-10-10 DIAGNOSIS — I10 Essential (primary) hypertension: Secondary | ICD-10-CM

## 2011-10-10 LAB — COMPREHENSIVE METABOLIC PANEL
ALT: 14 U/L (ref 0–35)
Alkaline Phosphatase: 87 U/L (ref 39–117)
CO2: 27 mEq/L (ref 19–32)
Sodium: 139 mEq/L (ref 135–145)
Total Bilirubin: 0.3 mg/dL (ref 0.3–1.2)
Total Protein: 6.9 g/dL (ref 6.0–8.3)

## 2011-10-10 LAB — POCT GLYCOSYLATED HEMOGLOBIN (HGB A1C): Hemoglobin A1C: 6.1

## 2011-10-10 MED ORDER — MONTELUKAST SODIUM 10 MG PO TABS: 10.0000 mg | ORAL_TABLET | Freq: Every day | ORAL | Status: DC

## 2011-10-10 MED ORDER — IPRATROPIUM-ALBUTEROL 0.5-2.5 (3) MG/3ML IN SOLN: 3.0000 mL | Freq: Four times a day (QID) | RESPIRATORY_TRACT | Status: DC | PRN

## 2011-10-10 MED ORDER — LOSARTAN POTASSIUM 50 MG PO TABS: 50.0000 mg | ORAL_TABLET | Freq: Every day | ORAL | Status: DC

## 2011-10-10 MED ORDER — METFORMIN HCL 1000 MG PO TABS: 1000.0000 mg | ORAL_TABLET | Freq: Two times a day (BID) | ORAL | Status: DC

## 2011-10-10 MED ORDER — CLOPIDOGREL BISULFATE 75 MG PO TABS: 75.0000 mg | ORAL_TABLET | Freq: Every day | ORAL | Status: DC

## 2011-10-10 MED ORDER — BUDESONIDE-FORMOTEROL FUMARATE 160-4.5 MCG/ACT IN AERO: 2.0000 | INHALATION_SPRAY | Freq: Two times a day (BID) | RESPIRATORY_TRACT | Status: DC

## 2011-10-10 NOTE — Progress Notes (Signed)
  Subjective:    Patient ID: Judith Boyer, female    DOB: 1945-02-25, 66 y.o.   MRN: [id number]  HPIPt having to use her inhaler more. Worse since in hospital back in January. Only uses duoneb away from home. Wants to try more long acting like Symbicort or Dulera. Sugars have been doing ok. Taking Metformin bid. She is smoking intermittently-when she gets stressed out.     Review of Systems     Objective:   Physical Exam increased AP diameter. There is very minimal air exchange. Heart sounds are distant. Extremities without edema  Results for orders placed in visit on 10/10/11  GLUCOSE, POCT (MANUAL RESULT ENTRY)      Component Value Range   POC Glucose 95  70 - 99 mg/dl  POCT GLYCOSYLATED HEMOGLOBIN (HGB A1C)      Component Value Range   Hemoglobin A1C 6.1          Assessment & Plan:  We'll check routine labs today. Patient have an worsening of her COPD symptoms. She does however continue to smoke.

## 2011-11-04 ENCOUNTER — Ambulatory Visit (INDEPENDENT_AMBULATORY_CARE_PROVIDER_SITE_OTHER): Payer: Medicare Other | Admitting: Emergency Medicine

## 2011-11-04 VITALS — BP 120/72 | HR 108 | Temp 98.2°F | Resp 16 | Ht 61.4 in | Wt 115.6 lb

## 2011-11-04 DIAGNOSIS — J441 Chronic obstructive pulmonary disease with (acute) exacerbation: Secondary | ICD-10-CM

## 2011-11-04 MED ORDER — PREDNISONE 10 MG PO TABS
10.0000 mg | ORAL_TABLET | Freq: Every day | ORAL | Status: DC
Start: 2011-11-04 — End: 2011-11-13

## 2011-11-04 MED ORDER — ALBUTEROL SULFATE (2.5 MG/3ML) 0.083% IN NEBU: 2.5000 mg | INHALATION_SOLUTION | Freq: Four times a day (QID) | RESPIRATORY_TRACT | Status: DC | PRN

## 2011-11-04 NOTE — Progress Notes (Signed)
[facility] [address], [place] [address] [phone]  Date:  11/04/2011   Name:  Judith Boyer   DOB:  [birth year]   MRN:  [id number]  PCP:  Lucilla Edin, MD    Chief Complaint: Shortness of Breath   History of Present Illness:  Judith Boyer is a 66 y.o. very pleasant female patient who presents with the following:  Several day history of increased shortness of breath.  No increase in quantity of sputum and no change in character of her usual sputum.  No increase in baseline cough.  Says that she feels tight and "needs something" to open her up.  No fever or chills. No nausea or vomiting. Taking her usual medication and nebs x 3 at home with no improvement.  Denies chest pain or heaviness. Some dizziness when she stands suddenly.  No peripheral edema.  No calf or thigh pain.  Still smoking.  Sugars are "normal" with this exacerbation.  Patient Active Problem List  Diagnosis  . COPD exacerbation  . Smoker  . Hypertension  . Diabetes mellitus  . Chest pain at rest  . CAD in native artery    Past Medical History  Diagnosis Date  . Myocardial infarction   . COPD (chronic obstructive pulmonary disease)   . Hypertension     Past Surgical History  Procedure Date  . Abdominal hysterectomy   . Shoulder surgery   . Coronary stent placement     History  Substance Use Topics  . Smoking status: Current Every Day Smoker -- 1.0 packs/day  . Smokeless tobacco: Not on file  . Alcohol Use: No    No family history on file.  Allergies  Allergen Reactions  . Doxycycline Anaphylaxis  . Codeine Other (See Comments)    hyperactivity  . Lisinopril Cough    Medication list has been reviewed and updated.  Current Outpatient Prescriptions on File Prior to Visit  Medication Sig Dispense Refill  . albuterol-ipratropium (COMBIVENT) 18-103 MCG/ACT inhaler Inhale 2 puffs into the lungs 4 (four) times daily.  3 Inhaler  3  . clopidogrel (PLAVIX)  75 MG tablet Take 1 tablet (75 mg total) by mouth daily.  30 tablet  11  . clotrimazole-betamethasone (LOTRISONE) cream Apply topically 2 (two) times daily.  30 g  0  . ipratropium-albuterol (DUONEB) 0.5-2.5 (3) MG/3ML SOLN Take 3 mLs by nebulization every 6 (six) hours as needed.  360 mL  11  . losartan (COZAAR) 50 MG tablet Take 1 tablet (50 mg total) by mouth daily.  30 tablet  11  . metFORMIN (GLUCOPHAGE) 1000 MG tablet Take 1 tablet (1,000 mg total) by mouth 2 (two) times daily with a meal.  60 tablet  11  . montelukast (SINGULAIR) 10 MG tablet Take 1 tablet (10 mg total) by mouth at bedtime.  30 tablet  11  . predniSONE (DELTASONE) 10 MG tablet Take 1-3 tablets (10-30 mg total) by mouth daily. Take 3 tablets each day for 3 days; then take 2 tablets each day for 3 days; then take 1 tablet each day for 3 days  9 tablet  0  . budesonide-formoterol (SYMBICORT) 160-4.5 MCG/ACT inhaler Inhale 2 puffs into the lungs 2 (two) times daily.  1 Inhaler  11    Review of Systems:  As per HPI, otherwise negative.    Physical Examination: Filed Vitals:   11/04/11 1303  BP: 120/72  Pulse: 108  Temp: 98.2 F (36.8 C)  Resp: 16   Filed Vitals:   11/04/11 1303  Height: 5' 1.4" (1.56 m)  Weight: 115 lb 9.6 oz (52.436 kg)   Body mass index is 21.56 kg/(m^2). Ideal Body Weight: Weight in (lb) to have BMI = 25: 133.8   GEN: WDWN, NAD, Non-toxic, A & O x 3 HEENT: Atraumatic, Normocephalic. Neck supple. No masses, No LAD. Ears and Nose: No external deformity. CV: RRR, No M/G/R. No JVD. No thrill. No extra heart sounds. PULM: CTA B, no wheezes, crackles, rhonchi. No retractions. No resp. distress. No accessory muscle use.  Poor air movement no adventitious sounds ABD: S, NT, ND, +BS. No rebound. No HSM. EXTR: No c/c/e NEURO Normal gait.  PSYCH: Normally interactive. Conversant. Not depressed or anxious appearing.  Calm demeanor.    Assessment and Plan: Exacerbation of COPD Continues to  smoke Add albuterol to neb Montez Hageman, Tessa Lerner, MD

## 2011-11-04 NOTE — Progress Notes (Signed)
Reviewed and agree.

## 2011-11-05 NOTE — Progress Notes (Signed)
I was doing remote charting while I was traveling from [place]. I saw the change in her neb. Thank You for seeing  Judith Boyer.

## 2011-11-06 NOTE — Progress Notes (Signed)
Appt made with Dr. Cleta Alberts for 11/13/11.

## 2011-11-13 ENCOUNTER — Ambulatory Visit (INDEPENDENT_AMBULATORY_CARE_PROVIDER_SITE_OTHER): Payer: Medicare Other | Admitting: Emergency Medicine

## 2011-11-13 VITALS — BP 152/72 | HR 94 | Temp 97.0°F | Resp 20 | Ht 61.0 in | Wt 117.0 lb

## 2011-11-13 DIAGNOSIS — J449 Chronic obstructive pulmonary disease, unspecified: Secondary | ICD-10-CM

## 2011-11-13 DIAGNOSIS — R05 Cough: Secondary | ICD-10-CM

## 2011-11-13 DIAGNOSIS — F17200 Nicotine dependence, unspecified, uncomplicated: Secondary | ICD-10-CM

## 2011-11-13 MED ORDER — PREDNISONE 10 MG PO TABS: ORAL_TABLET | ORAL | Status: DC

## 2011-11-13 NOTE — Progress Notes (Signed)
  Subjective:    Patient ID: Kathe Becton, female    DOB: May 09, 1945, 66 y.o.   MRN: [id number]  HPI patient seen last week by Dr. Dareen Piano and diagnosed with an exacerbation of her COPD. She was treated with increased albuterol per nebulizer as well as a tapered dose of prednisone. She is feeling some better but is not near baseline. She has been using patches and has not smoked since that visit. She's been using Advair instead of percent record in that she feels this helps her more. She does get very tachycardic when she does the extra albuterol to her nebulizer.    Review of Systems     Objective:   Physical Exam patient is comfortable and him talking to her. She is a significant increase in AP diameter with diminished breath sounds in the bases. There is a mild prolongation of expiration. Cardiac exam with a regular rate and rhythm. Her extremities are without edema.        Assessment & Plan:  She will be using her Advair instead a central cord. I advised her to try and back off the extra albuterol she is using because of the tachycardia initiate has to will give a short taper of her lower dose prednisone and see if we can break this bad cycle. She was encouraged about her attempts to quit smoking.

## 2012-02-05 ENCOUNTER — Inpatient Hospital Stay (HOSPITAL_COMMUNITY): Payer: Medicare Other

## 2012-02-05 ENCOUNTER — Encounter (HOSPITAL_COMMUNITY): Payer: Self-pay | Admitting: General Practice

## 2012-02-05 ENCOUNTER — Inpatient Hospital Stay (HOSPITAL_COMMUNITY)
Admission: AD | Admit: 2012-02-05 | Discharge: 2012-02-07 | DRG: 313 | Disposition: A | Discharge status: Home / Self Care | Payer: Medicare Other | Source: Ambulatory Visit | Attending: Cardiovascular Disease | Admitting: Cardiovascular Disease

## 2012-02-05 DIAGNOSIS — R062 Wheezing: Secondary | ICD-10-CM

## 2012-02-05 DIAGNOSIS — Z90710 Acquired absence of both cervix and uterus: Secondary | ICD-10-CM

## 2012-02-05 DIAGNOSIS — Z7902 Long term (current) use of antithrombotics/antiplatelets: Secondary | ICD-10-CM

## 2012-02-05 DIAGNOSIS — I2510 Atherosclerotic heart disease of native coronary artery without angina pectoris: Secondary | ICD-10-CM | POA: Diagnosis present

## 2012-02-05 DIAGNOSIS — Z9861 Coronary angioplasty status: Secondary | ICD-10-CM

## 2012-02-05 DIAGNOSIS — I10 Essential (primary) hypertension: Secondary | ICD-10-CM | POA: Diagnosis present

## 2012-02-05 DIAGNOSIS — F17200 Nicotine dependence, unspecified, uncomplicated: Secondary | ICD-10-CM | POA: Diagnosis present

## 2012-02-05 DIAGNOSIS — F411 Generalized anxiety disorder: Secondary | ICD-10-CM | POA: Diagnosis present

## 2012-02-05 DIAGNOSIS — R0902 Hypoxemia: Secondary | ICD-10-CM | POA: Diagnosis present

## 2012-02-05 DIAGNOSIS — R079 Chest pain, unspecified: Principal | ICD-10-CM | POA: Diagnosis present

## 2012-02-05 DIAGNOSIS — Z79899 Other long term (current) drug therapy: Secondary | ICD-10-CM

## 2012-02-05 DIAGNOSIS — Z881 Allergy status to other antibiotic agents status: Secondary | ICD-10-CM

## 2012-02-05 DIAGNOSIS — Z7982 Long term (current) use of aspirin: Secondary | ICD-10-CM

## 2012-02-05 DIAGNOSIS — E119 Type 2 diabetes mellitus without complications: Secondary | ICD-10-CM | POA: Diagnosis present

## 2012-02-05 DIAGNOSIS — Z888 Allergy status to other drugs, medicaments and biological substances status: Secondary | ICD-10-CM

## 2012-02-05 DIAGNOSIS — J4489 Other specified chronic obstructive pulmonary disease: Secondary | ICD-10-CM | POA: Diagnosis present

## 2012-02-05 DIAGNOSIS — I252 Old myocardial infarction: Secondary | ICD-10-CM

## 2012-02-05 DIAGNOSIS — Z885 Allergy status to narcotic agent status: Secondary | ICD-10-CM

## 2012-02-05 DIAGNOSIS — J449 Chronic obstructive pulmonary disease, unspecified: Secondary | ICD-10-CM | POA: Diagnosis present

## 2012-02-05 HISTORY — DX: Shortness of breath: R06.02

## 2012-02-05 HISTORY — DX: Atherosclerotic heart disease of native coronary artery without angina pectoris: I25.10

## 2012-02-05 LAB — TROPONIN I: Troponin I: <0.3 ng/mL (ref <0.30)

## 2012-02-05 LAB — COMPREHENSIVE METABOLIC PANEL
AST: 19 U/L (ref 0–37)
Albumin: 3.8 g/dL (ref 3.5–5.2)
BUN: 13 mg/dL (ref 6–23)
Creatinine, Ser: 0.58 mg/dL (ref 0.50–1.10)
Total Protein: 6.9 g/dL (ref 6.0–8.3)

## 2012-02-05 LAB — CBC WITH DIFFERENTIAL/PLATELET
Basophils Absolute: 0 10*3/uL (ref 0.0–0.1)
Basophils Relative: 0 % (ref 0–1)
Eosinophils Relative: 1 % (ref 0–5)
HCT: 36.9 % (ref 36.0–46.0)
Lymphocytes Relative: 29 % (ref 12–46)
MCHC: 32 g/dL (ref 30.0–36.0)
Monocytes Absolute: 0.4 10*3/uL (ref 0.1–1.0)
Neutro Abs: 4 10*3/uL (ref 1.7–7.7)
Platelets: 291 10*3/uL (ref 150–400)
RDW: 13.9 % (ref 11.5–15.5)
WBC: 6.3 10*3/uL (ref 4.0–10.5)

## 2012-02-05 LAB — BLOOD GAS, ARTERIAL
Drawn by: 313061
Patient temperature: 98.6
pCO2 arterial: 41.8 mmHg (ref 35.0–45.0)
pH, Arterial: 7.423 (ref 7.350–7.450)

## 2012-02-05 LAB — APTT: aPTT: 26 seconds (ref 24–37)

## 2012-02-05 LAB — GLUCOSE, CAPILLARY
Glucose-Capillary: 135 mg/dL — ABNORMAL HIGH (ref 70–99)
Glucose-Capillary: 158 mg/dL — ABNORMAL HIGH (ref 70–99)

## 2012-02-05 LAB — HEMOGLOBIN A1C
Hgb A1c MFr Bld: 6.9 % — ABNORMAL HIGH (ref <5.7)
Mean Plasma Glucose: 151 mg/dL — ABNORMAL HIGH (ref <117)

## 2012-02-05 LAB — TSH: TSH: 1.648 u[IU]/mL (ref 0.350–4.500)

## 2012-02-05 LAB — PRO B NATRIURETIC PEPTIDE: Pro B Natriuretic peptide (BNP): 32.2 pg/mL (ref 0–125)

## 2012-02-05 LAB — PROTIME-INR: Prothrombin Time: 12.5 seconds (ref 11.6–15.2)

## 2012-02-05 MED ORDER — IOHEXOL 350 MG/ML SOLN
80.0000 mL | Freq: Once | INTRAVENOUS | Status: AC | PRN
  Administered 2012-02-05: 80 mL via INTRAVENOUS

## 2012-02-05 MED ORDER — INSULIN ASPART 100 UNIT/ML ~~LOC~~ SOLN
0.0000 [IU] | Freq: Three times a day (TID) | SUBCUTANEOUS | Status: DC
  Administered 2012-02-05: 2 [IU] via SUBCUTANEOUS
  Administered 2012-02-06 (×2): 1 [IU] via SUBCUTANEOUS
  Administered 2012-02-07: 7 [IU] via SUBCUTANEOUS

## 2012-02-05 MED ORDER — MOMETASONE FURO-FORMOTEROL FUM 200-5 MCG/ACT IN AERO
2.0000 | INHALATION_SPRAY | Freq: Two times a day (BID) | RESPIRATORY_TRACT | Status: DC
  Administered 2012-02-05 – 2012-02-07 (×4): 2 via RESPIRATORY_TRACT
  Filled 2012-02-05 (×2): qty 8.8

## 2012-02-05 MED ORDER — HEPARIN BOLUS VIA INFUSION
2000.0000 [IU] | Freq: Once | INTRAVENOUS | Status: AC
  Administered 2012-02-05: 2000 [IU] via INTRAVENOUS
  Filled 2012-02-05: qty 2000

## 2012-02-05 MED ORDER — NITROGLYCERIN 0.4 MG SL SUBL: 0.4000 mg | SUBLINGUAL_TABLET | SUBLINGUAL | Status: DC | PRN

## 2012-02-05 MED ORDER — METFORMIN HCL 500 MG PO TABS
1000.0000 mg | ORAL_TABLET | Freq: Two times a day (BID) | ORAL | Status: DC
  Filled 2012-02-05 (×2): qty 2

## 2012-02-05 MED ORDER — CLOPIDOGREL BISULFATE 75 MG PO TABS
75.0000 mg | ORAL_TABLET | Freq: Every day | ORAL | Status: DC
  Administered 2012-02-06: 75 mg via ORAL
  Filled 2012-02-05 (×3): qty 1

## 2012-02-05 MED ORDER — ONDANSETRON HCL 4 MG/2ML IJ SOLN: 4.0000 mg | Freq: Four times a day (QID) | INTRAMUSCULAR | Status: DC | PRN

## 2012-02-05 MED ORDER — DM-GUAIFENESIN ER 30-600 MG PO TB12
1.0000 | ORAL_TABLET | Freq: Two times a day (BID) | ORAL | Status: DC | PRN
  Filled 2012-02-05: qty 1

## 2012-02-05 MED ORDER — SODIUM CHLORIDE 0.9 % IJ SOLN
3.0000 mL | Freq: Two times a day (BID) | INTRAMUSCULAR | Status: DC
  Administered 2012-02-05 – 2012-02-07 (×3): 3 mL via INTRAVENOUS

## 2012-02-05 MED ORDER — METOPROLOL TARTRATE 12.5 MG HALF TABLET
12.5000 mg | ORAL_TABLET | Freq: Two times a day (BID) | ORAL | Status: DC
  Administered 2012-02-05 – 2012-02-07 (×4): 12.5 mg via ORAL
  Filled 2012-02-05 (×5): qty 1

## 2012-02-05 MED ORDER — IPRATROPIUM-ALBUTEROL 18-103 MCG/ACT IN AERO
2.0000 | INHALATION_SPRAY | Freq: Four times a day (QID) | RESPIRATORY_TRACT | Status: DC
  Administered 2012-02-05 – 2012-02-07 (×9): 2 via RESPIRATORY_TRACT
  Filled 2012-02-05 (×2): qty 14.7

## 2012-02-05 MED ORDER — HEPARIN (PORCINE) IN NACL 100-0.45 UNIT/ML-% IJ SOLN
750.0000 [IU]/h | INTRAMUSCULAR | Status: DC
  Administered 2012-02-05: 750 [IU]/h via INTRAVENOUS
  Filled 2012-02-05 (×5): qty 250

## 2012-02-05 MED ORDER — ALPRAZOLAM 0.25 MG PO TABS
0.2500 mg | ORAL_TABLET | Freq: Once | ORAL | Status: AC
  Administered 2012-02-05: 0.25 mg via ORAL
  Filled 2012-02-05: qty 1

## 2012-02-05 MED ORDER — ASPIRIN EC 81 MG PO TBEC
81.0000 mg | DELAYED_RELEASE_TABLET | Freq: Every day | ORAL | Status: DC
  Administered 2012-02-06 – 2012-02-07 (×2): 81 mg via ORAL
  Filled 2012-02-05 (×2): qty 1

## 2012-02-05 MED ORDER — SODIUM CHLORIDE 0.9 % IV SOLN: 250.0000 mL | INTRAVENOUS | Status: DC | PRN

## 2012-02-05 MED ORDER — SIMVASTATIN 10 MG PO TABS
10.0000 mg | ORAL_TABLET | Freq: Every day | ORAL | Status: DC
  Administered 2012-02-05 – 2012-02-07 (×3): 10 mg via ORAL
  Filled 2012-02-05 (×3): qty 1

## 2012-02-05 MED ORDER — MONTELUKAST SODIUM 10 MG PO TABS
10.0000 mg | ORAL_TABLET | Freq: Every day | ORAL | Status: DC
  Administered 2012-02-05 – 2012-02-06 (×2): 10 mg via ORAL
  Filled 2012-02-05 (×3): qty 1

## 2012-02-05 MED ORDER — ACETAMINOPHEN 325 MG PO TABS
650.0000 mg | ORAL_TABLET | ORAL | Status: DC | PRN
  Filled 2012-02-05: qty 2

## 2012-02-05 MED ORDER — ASPIRIN 300 MG RE SUPP
300.0000 mg | RECTAL | Status: AC
  Filled 2012-02-05: qty 1

## 2012-02-05 MED ORDER — LOSARTAN POTASSIUM 50 MG PO TABS
50.0000 mg | ORAL_TABLET | Freq: Every morning | ORAL | Status: DC
  Administered 2012-02-05 – 2012-02-07 (×3): 50 mg via ORAL
  Filled 2012-02-05 (×3): qty 1

## 2012-02-05 MED ORDER — SODIUM CHLORIDE 0.9 % IJ SOLN: 3.0000 mL | INTRAMUSCULAR | Status: DC | PRN

## 2012-02-05 MED ORDER — ASPIRIN 81 MG PO CHEW
324.0000 mg | CHEWABLE_TABLET | ORAL | Status: AC
  Administered 2012-02-05: 324 mg via ORAL
  Filled 2012-02-05: qty 4

## 2012-02-05 NOTE — H&P (Signed)
Judith Boyer is an 66 y.o. female.   Chief Complaint: Chest pain  HPI: 66 years old female with 1 week history of retrosternal, non-radiating, dull chest pain, lasting for 5-10 minutes to few hours. No NTG use. Also had drop in oxygent saturation from 97 % to 82 % with walking across 50 feet in office.  Past Medical History  Diagnosis Date  . Myocardial infarction   . COPD (chronic obstructive pulmonary disease)   . Hypertension       Past Surgical History  Procedure Date  . Abdominal hysterectomy   . Shoulder surgery   . Coronary stent placement     No family history on file. Social History:  reports that she has been smoking.  She does not have any smokeless tobacco history on file. She reports that she does not drink alcohol or use illicit drugs.  Allergies:  Allergies  Allergen Reactions  . Doxycycline Anaphylaxis  . Codeine Other (See Comments)    hyperactivity  . Lisinopril Cough    Medications Prior to Admission  Medication Sig Dispense Refill  . albuterol-ipratropium (COMBIVENT) 18-103 MCG/ACT inhaler Inhale 2 puffs into the lungs every 6 (six) hours as needed. For shortness of breath in between nebulizer treatments      . aspirin 325 MG EC tablet Take 325 mg by mouth every morning.      . clopidogrel (PLAVIX) 75 MG tablet Take 75 mg by mouth every morning.      . Fluticasone-Salmeterol (ADVAIR) 500-50 MCG/DOSE AEPB Inhale 1 puff into the lungs every 12 (twelve) hours.      Marland Kitchen ipratropium-albuterol (DUONEB) 0.5-2.5 (3) MG/3ML SOLN Take 3 mLs by nebulization 3 (three) times daily. At 9AM, 3PM, and 10PM. Additional dose if necessary      . losartan (COZAAR) 50 MG tablet Take 50 mg by mouth every morning.      . metFORMIN (GLUCOPHAGE) 1000 MG tablet Take 1 tablet (1,000 mg total) by mouth 2 (two) times daily with a meal.  60 tablet  11  . montelukast (SINGULAIR) 10 MG tablet Take 10 mg by mouth at bedtime.      . pravastatin (PRAVACHOL) 80 MG tablet Take 80 mg by  mouth every evening.      . [DISCONTINUED] albuterol-ipratropium (COMBIVENT) 18-103 MCG/ACT inhaler Inhale 2 puffs into the lungs 4 (four) times daily.  3 Inhaler  3  . [DISCONTINUED] clopidogrel (PLAVIX) 75 MG tablet Take 1 tablet (75 mg total) by mouth daily.  30 tablet  11  . [DISCONTINUED] losartan (COZAAR) 50 MG tablet Take 1 tablet (50 mg total) by mouth daily.  30 tablet  11  . [DISCONTINUED] montelukast (SINGULAIR) 10 MG tablet Take 1 tablet (10 mg total) by mouth at bedtime.  30 tablet  11  . dextromethorphan-guaiFENesin (MUCINEX DM) 30-600 MG per 12 hr tablet Take 1 tablet by mouth every 12 (twelve) hours as needed. For cough        Results for orders placed during the hospital encounter of 02/05/12 (from the past 48 hour(Boyer))  GLUCOSE, CAPILLARY     Status: Abnormal   Collection Time   02/05/12  2:22 PM      Component Value Range Comment   Glucose-Capillary 135 (*) 70 - 99 mg/dL    No results found.  @ROS @ + weight loss, wears glasses, wears partial upper and lower dentures, + cough, + smoking, + COPD, + Chest pain, + dyspnea, No GI bleed, hepatitis, Blood transfusion, Kidney stone, Stroke, Seizures  or psychiatric admission.  Blood pressure 135/73, pulse 110, temperature 97.9 F (36.6 C), temperature source Oral, resp. rate 20, height 5\' 1"  (1.549 m), weight 53.071 kg (117 lb), SpO2 97.00%. Gen: Moderated distress due to dyspnea. Short and averagely nourished. HEENT: [place]/AT, Blue eyes, wears glasses and partial upper and lower dentures. Moist mucous membranes  CV: Regular rate and rhythm, tachycardic, II/VI systolic murmur. No rubs or gallops.  PULM: Breath sound diminished bilaterally. Prolonged expiration.  ABD: Soft, non tender, non distended, normal bowel sounds  EXT: No edema. No cyanosis. No clubbing.  Neuro: Alert and oriented x3. Moves all 4 extremities. Skin:-Warm and dry.    Assessment/Plan Chest pain R/O MI CAD COPD HTN Anxiety Dyspnea r/o PE.  Admit/  Oxygen post ABG/ IV heparin/CT of chest  Judith Boyer 02/05/2012, 3:03 PM

## 2012-02-05 NOTE — Progress Notes (Signed)
Pt arrived from home as direct admit.  Dr. Algie Coffer informed of pt's location.  Instructed will put order in for diet.  Amanda Pea, Charity fundraiser.

## 2012-02-05 NOTE — Progress Notes (Signed)
Pt's HR 32 and c/o of feeling little weak.  BP 180/65, P86.  Dr. Lenard Lance informed and ordered xanax 0.25 mg po x1.  Pt up on side of bed family at bedside.  Declined getting in bed.  Stated "I feel all right."  Requested cup of OJ, which is given.  Report passed on to oncoming nurse.  Amanda Pea, Charity fundraiser.

## 2012-02-05 NOTE — Progress Notes (Signed)
ANTICOAGULATION CONSULT NOTE - Initial Consult  Pharmacy Consult for Heparin Indication: chest pain/ACS  Allergies  Allergen Reactions  . Doxycycline Anaphylaxis  . Codeine Other (See Comments)    hyperactivity  . Lisinopril Cough    Patient Measurements: Height: 5\' 1"  (154.9 cm) Weight: 117 lb (53.071 kg) IBW/kg (Calculated) : 47.8   Vital Signs: Temp: 97.9 F (36.6 C) (12/31 1314) Temp src: Oral (12/31 1314) BP: 135/73 mmHg (12/31 1314) Pulse Rate: 110  (12/31 1314)  Labs: No results found for this basename: HGB:2,HCT:3,PLT:3,APTT:3,LABPROT:3,INR:3,HEPARINUNFRC:3,CREATININE:3,CKTOTAL:3,CKMB:3,TROPONINI:3 in the last 72 hours  Estimated Creatinine Clearance: 51.5 ml/min (by C-G formula based on Cr of 0.58).   Medical History: Past Medical History  Diagnosis Date  . Myocardial infarction   . COPD (chronic obstructive pulmonary disease)   . Hypertension     Assessment: 66 year old admitted for CP.  Scr stable.   Goal of Therapy:  Heparin level 0.3-0.7 units/ml Monitor platelets by anticoagulation protocol: Yes   Plan:  1) Heparin 2000 units iv bolus x 1 2) Heparin drip at 750 units / hr 3) Heparin level 6 hours after heparin begins 4) Daily heparin level, CBC  Thank you. Okey Regal, PharmD [phone]  02/05/2012,3:08 PM

## 2012-02-05 NOTE — Progress Notes (Signed)
ANTICOAGULATION CONSULT NOTE - Follow Up  Pharmacy Consult for Heparin Indication: chest pain/ACS  Allergies  Allergen Reactions  . Doxycycline Anaphylaxis  . Codeine Other (See Comments)    hyperactivity  . Lisinopril Cough    Patient Measurements: Height: 5\' 1"  (154.9 cm) Weight: 117 lb (53.071 kg) IBW/kg (Calculated) : 47.8   Vital Signs: Temp: 98.1 F (36.7 C) (12/31 2113) Temp src: Oral (12/31 2113) BP: 132/74 mmHg (12/31 2113) Pulse Rate: 72  (12/31 2113)  Labs:  Basename 02/05/12 2222 02/05/12 2044 02/05/12 1520 02/05/12 1519  HGB -- -- -- 11.8*  HCT -- -- -- 36.9  PLT -- -- -- 291  APTT -- -- -- 26  LABPROT -- -- -- 12.5  INR -- -- -- 0.94  HEPARINUNFRC 0.35 -- -- --  CREATININE -- -- -- 0.58  CKTOTAL -- -- -- --  CKMB -- -- -- --  TROPONINI -- <0.30 <0.30 --    Estimated Creatinine Clearance: 51.5 ml/min (by C-G formula based on Cr of 0.58).   Medical History: Past Medical History  Diagnosis Date  . Myocardial infarction   . COPD (chronic obstructive pulmonary disease)   . Hypertension   . Coronary artery disease   . Shortness of breath   . Diabetes mellitus without complication     type 2    Assessment: 6 hour heparin level = 0.35 on IV heparin drip 750 units/hr in this 66 year old admitted for CP.  Level is therapeutic. No bleeding noted.   Goal of Therapy:  Heparin level 0.3-0.7 units/ml Monitor platelets by anticoagulation protocol: Yes   Plan:  Continue Heparin drip at 750 units /hr Daily heparin level, CBC  Thank you.  Noah Delaine, RPh Clinical Pharmacist Pager: [phone] 02/05/2012,11:10 PM

## 2012-02-06 DIAGNOSIS — Z9289 Personal history of other medical treatment: Secondary | ICD-10-CM

## 2012-02-06 HISTORY — DX: Personal history of other medical treatment: Z92.89

## 2012-02-06 LAB — LIPID PANEL
Cholesterol: 202 mg/dL — ABNORMAL HIGH (ref 0–200)
Triglycerides: 150 mg/dL — ABNORMAL HIGH (ref <150)

## 2012-02-06 LAB — CBC
HCT: 35.6 % — ABNORMAL LOW (ref 36.0–46.0)
MCH: 27.8 pg (ref 26.0–34.0)
MCHC: 32.3 g/dL (ref 30.0–36.0)
MCV: 86.2 fL (ref 78.0–100.0)
RDW: 13.8 % (ref 11.5–15.5)
WBC: 7 10*3/uL (ref 4.0–10.5)

## 2012-02-06 LAB — GLUCOSE, CAPILLARY: Glucose-Capillary: 133 mg/dL — ABNORMAL HIGH (ref 70–99)

## 2012-02-06 LAB — BASIC METABOLIC PANEL
BUN: 18 mg/dL (ref 6–23)
Calcium: 9.1 mg/dL (ref 8.4–10.5)
Chloride: 104 mEq/L (ref 96–112)
Creatinine, Ser: 0.64 mg/dL (ref 0.50–1.10)
GFR calc Af Amer: >90 mL/min (ref >90)

## 2012-02-06 LAB — TROPONIN I: Troponin I: <0.3 ng/mL (ref <0.30)

## 2012-02-06 NOTE — Progress Notes (Signed)
Pt had a R on T PVC, pt VS wnl.  Notified Dr. Algie Coffer, no new orders given at this time, will continue to monitor.

## 2012-02-06 NOTE — Progress Notes (Signed)
ANTICOAGULATION CONSULT NOTE - Follow Up  Pharmacy Consult for Heparin Indication: chest pain/ACS  Allergies  Allergen Reactions  . Doxycycline Anaphylaxis  . Codeine Other (See Comments)    hyperactivity  . Lisinopril Cough    Patient Measurements: Height: 5\' 1"  (154.9 cm) Weight: 117 lb 6.4 oz (53.252 kg) (scale A) IBW/kg (Calculated) : 47.8   Vital Signs: Temp: 98.1 F (36.7 C) (01/01 1302) Temp src: Oral (01/01 1302) BP: 120/68 mmHg (01/01 1302) Pulse Rate: 80  (01/01 1302)  Labs:  Basename 02/06/12 0255 02/06/12 0236 02/05/12 2222 02/05/12 2044 02/05/12 1520 02/05/12 1519  HGB 11.5* -- -- -- -- 11.8*  HCT 35.6* -- -- -- -- 36.9  PLT 269 -- -- -- -- 291  APTT -- -- -- -- -- 26  LABPROT -- -- -- -- -- 12.5  INR -- -- -- -- -- 0.94  HEPARINUNFRC 0.45 -- 0.35 -- -- --  CREATININE 0.64 -- -- -- -- 0.58  CKTOTAL -- -- -- -- -- --  CKMB -- -- -- -- -- --  TROPONINI -- <0.30 -- <0.30 <0.30 --    Estimated Creatinine Clearance: 51.5 ml/min (by C-G formula based on Cr of 0.64).   Medical History: Past Medical History  Diagnosis Date  . Myocardial infarction   . COPD (chronic obstructive pulmonary disease)   . Hypertension   . Coronary artery disease   . Shortness of breath   . Diabetes mellitus without complication     type 2    Assessment: 67 y/o with 1 week h/o retrosternal, non-radiating, dull chest pain on heparin drip per pharmacy. Per MD, NST in the AM. HL this AM was 0.45 on 750 units/hr of heparin. H/H 11.5/35.6, Plts 269, no evidence of bleeding reported. Renal function stable.    Goal of Therapy:  Heparin level 0.3-0.7 units/ml Monitor platelets by anticoagulation protocol: Yes   Plan:  -Continue Heparin drip at 750 units /hr -Daily heparin level, CBC -Monitor for bleeding -F/U MD plans for heparin  Thank you for the consult,   Abran Duke, PharmD Clinical Pharmacist Phone: [phone] Pager: [phone] 02/06/2012 1:33 PM

## 2012-02-06 NOTE — Progress Notes (Signed)
Subjective:  Feeling better. No PE on CT chest. + COPD. Afebrile. Neg Troponin I x 3.  Objective:  Vital Signs in the last 24 hours: Temp:  [97.8 F (36.6 C)-98.1 F (36.7 C)] 97.8 F (36.6 C) (01/01 0440) Pulse Rate:  [72-110] 84  (01/01 0440) Cardiac Rhythm:  [-] Normal sinus rhythm (01/01 0807) Resp:  [19-20] 19  (01/01 0440) BP: (132-142)/(50-74) 135/52 mmHg (01/01 0440) SpO2:  [95 %-97 %] 95 % (01/01 0810) Weight:  [53.071 kg (117 lb)-53.252 kg (117 lb 6.4 oz)] 53.252 kg (117 lb 6.4 oz) (01/01 0440)  Physical Exam: BP Readings from Last 1 Encounters:  02/06/12 135/52    Wt Readings from Last 1 Encounters:  02/06/12 53.252 kg (117 lb 6.4 oz)    Weight change:   HEENT: [place]/AT, Eyes-Blue, PERL, EOMI, Conjunctiva-Pink, Sclera-Non-icteric. Wears glasses. Neck: No JVD, No bruit, Trachea midline. Lungs:  Clear but diminished at bases, Bilateral. Prolonged expiration. Cardiac:  Regular rhythm, normal S1 and S2, no S3. II/VI systolic murmur. Abdomen:  Soft, non-tender. Extremities:  No edema present. No cyanosis. No clubbing. CNS: AxOx3, Cranial nerves grossly intact, moves all 4 extremities. Right handed. Skin: Warm and dry.   Intake/Output from previous day: 12/31 0701 - 01/01 0700 In: 510 [P.O.:420; I.V.:90] Out: 575 [Urine:575]    Lab Results: BMET    Component Value Date/Time   NA 141 02/06/2012 0255   K 4.3 02/06/2012 0255   CL 104 02/06/2012 0255   CO2 29 02/06/2012 0255   GLUCOSE 128* 02/06/2012 0255   BUN 18 02/06/2012 0255   CREATININE 0.64 02/06/2012 0255   CREATININE 0.58 10/10/2011 1025   CALCIUM 9.1 02/06/2012 0255   GFRNONAA >90 02/06/2012 0255   GFRAA >90 02/06/2012 0255   CBC    Component Value Date/Time   WBC 7.0 02/06/2012 0255   RBC 4.13 02/06/2012 0255   HGB 11.5* 02/06/2012 0255   HCT 35.6* 02/06/2012 0255   PLT 269 02/06/2012 0255   MCV 86.2 02/06/2012 0255   MCH 27.8 02/06/2012 0255   MCHC 32.3 02/06/2012 0255   RDW 13.8 02/06/2012 0255   LYMPHSABS 1.8 02/05/2012 1519   MONOABS 0.4 02/05/2012 1519   EOSABS 0.1 02/05/2012 1519   BASOSABS 0.0 02/05/2012 1519   CARDIAC ENZYMES Lab Results  Component Value Date   CKTOTAL 144 02/05/2011   CKMB 7.2* 02/05/2011   TROPONINI <0.30 02/06/2012    Scheduled Meds:   . albuterol-ipratropium  2 puff Inhalation Q6H  . aspirin EC  81 mg Oral Daily  . clopidogrel  75 mg Oral Q breakfast  . insulin aspart  0-9 Units Subcutaneous TID WC  . losartan  50 mg Oral q morning - 10a  . metoprolol tartrate  12.5 mg Oral BID  . mometasone-formoterol  2 puff Inhalation BID  . montelukast  10 mg Oral QHS  . simvastatin  10 mg Oral q1800  . sodium chloride  3 mL Intravenous Q12H   Continuous Infusions:   . heparin 750 Units/hr (02/05/12 1729)   PRN Meds:.sodium chloride, acetaminophen, dextromethorphan-guaiFENesin, nitroGLYCERIN, ondansetron (ZOFRAN) IV, sodium chloride  Assessment/Plan:  Patient Active Hospital Problem List: Chest pain  CAD  COPD  HTN  Anxiety   Nuclear stress test in AM. Increase activity.    LOS: 1 day    Orpah Cobb  MD  02/06/2012, 12:45 PM

## 2012-02-07 ENCOUNTER — Inpatient Hospital Stay (HOSPITAL_COMMUNITY): Payer: Medicare Other

## 2012-02-07 LAB — HEPARIN LEVEL (UNFRACTIONATED): Heparin Unfractionated: 0.38 IU/mL (ref 0.30–0.70)

## 2012-02-07 LAB — CBC
Platelets: 256 10*3/uL (ref 150–400)
RDW: 13.9 % (ref 11.5–15.5)
WBC: 6.4 10*3/uL (ref 4.0–10.5)

## 2012-02-07 LAB — GLUCOSE, CAPILLARY: Glucose-Capillary: 133 mg/dL — ABNORMAL HIGH (ref 70–99)

## 2012-02-07 MED ORDER — TECHNETIUM TC 99M SESTAMIBI GENERIC - CARDIOLITE
30.0000 | Freq: Once | INTRAVENOUS | Status: AC | PRN
  Administered 2012-02-07: 30 via INTRAVENOUS

## 2012-02-07 MED ORDER — METHYLPREDNISOLONE SODIUM SUCC 125 MG IJ SOLR
INTRAMUSCULAR | Status: AC
  Administered 2012-02-07: 125 mg via INTRAVENOUS
  Filled 2012-02-07: qty 2

## 2012-02-07 MED ORDER — SODIUM CHLORIDE 0.9 % IV SOLN
10.0000 ug/kg | INTRAVENOUS | Status: DC
  Administered 2012-02-07: 12:00:00 via INTRAVENOUS
  Filled 2012-02-07: qty 12

## 2012-02-07 MED ORDER — TECHNETIUM TC 99M SESTAMIBI GENERIC - CARDIOLITE
10.0000 | Freq: Once | INTRAVENOUS | Status: AC | PRN
  Administered 2012-02-07: 10 via INTRAVENOUS

## 2012-02-07 MED ORDER — METHYLPREDNISOLONE SODIUM SUCC 125 MG IJ SOLR
125.0000 mg | Freq: Once | INTRAMUSCULAR | Status: AC
  Administered 2012-02-07: 125 mg via INTRAVENOUS
  Filled 2012-02-07: qty 2

## 2012-02-07 MED ORDER — DIPHENHYDRAMINE HCL 50 MG/ML IJ SOLN: 50.0000 mg | Freq: Once | INTRAMUSCULAR | Status: DC

## 2012-02-07 MED ORDER — DIPHENHYDRAMINE HCL 50 MG/ML IJ SOLN
INTRAMUSCULAR | Status: AC
  Administered 2012-02-07: 50 mg via INTRAVENOUS
  Filled 2012-02-07: qty 1

## 2012-02-07 MED ORDER — NITROGLYCERIN 0.4 MG SL SUBL: 0.4000 mg | SUBLINGUAL_TABLET | SUBLINGUAL | Status: AC | PRN

## 2012-02-07 NOTE — Discharge Summary (Signed)
Physician Discharge Summary  Patient ID: Judith Boyer MRN: [id number] DOB/AGE: Jul 03, 1945 67 y.o.  Admit date: 02/05/2012 Discharge date: 02/07/2012  Admission Diagnoses: Chest pain  CAD  COPD  HTN  Anxiety   Discharge Diagnoses:  Principle Problems:  * Chest pain* CAD  COPD  HTN  Anxiety  DM, II  Discharged Condition: good  Hospital Course: 67 years old has 1 week history of retrosternal, non-radiating, dull chest pain, lasting for 5-10 minutes to few hours. She had exertional dyspnea and hypoxia. Her CT of chest was negative for pulmonary embolism and nuclear stress test was negative for reversible ischemia but EF was 75 %. Patient was given prescription for NTG and advised to see me again in 1 week.   Consults: None  Significant Diagnostic Studies: labs: Normal electrolytes, normal WBC and platelets count, slightly low Hgb of 11.5. Normal Troponin I x 3.  Radiology: CT scan: chest No CT evidence for acute pulmonary embolus. No aneurysm or dissection of the thoracic aorta. Emphysema without acute cardiopulmonary findings  Nuclear medicine: No reversible ischemia.  Treatments: analgesia: acetaminophen and cardiac meds: losartan, metoprolol and simvastatin. IV heparin.  Discharge Exam: Blood pressure 131/60, pulse 91, temperature 98 F (36.7 C), temperature source Oral, resp. rate 20, height 5\' 1"  (1.549 m), weight 52.481 kg (115 lb 11.2 oz), SpO2 95.00%.  HEENT: [place]/AT, Eyes-Blue, PERL, EOMI, Conjunctiva-Pink, Sclera-Non-icteric. Wears glasses.  Neck: No JVD, No bruit, Trachea midline.  Lungs: Clear but diminished at bases, Bilateral. Prolonged expiration.  Cardiac: Regular rhythm, normal S1 and S2, no S3. II/VI systolic murmur.  Abdomen: Soft, non-tender.  Extremities: No edema present. No cyanosis. No clubbing.  CNS: AxOx3, Cranial nerves grossly intact, moves all 4 extremities. Right handed.  Skin: Warm and dry.  Disposition: 01-Home or Self Care       Medication List     As of 02/07/2012  5:22 PM    STOP taking these medications         metFORMIN 1000 MG tablet   Commonly known as: GLUCOPHAGE      TAKE these medications         aspirin 325 MG EC tablet   Take 325 mg by mouth every morning.      clopidogrel 75 MG tablet   Commonly known as: PLAVIX   Take 75 mg by mouth every morning.      dextromethorphan-guaiFENesin 30-600 MG per 12 hr tablet   Commonly known as: MUCINEX DM   Take 1 tablet by mouth every 12 (twelve) hours as needed. For cough      Fluticasone-Salmeterol 500-50 MCG/DOSE Aepb   Commonly known as: ADVAIR   Inhale 1 puff into the lungs every 12 (twelve) hours.      ipratropium-albuterol 0.5-2.5 (3) MG/3ML Soln   Commonly known as: DUONEB   Take 3 mLs by nebulization 3 (three) times daily. At 9AM, 3PM, and 10PM. Additional dose if necessary      albuterol-ipratropium 18-103 MCG/ACT inhaler   Commonly known as: COMBIVENT   Inhale 2 puffs into the lungs every 6 (six) hours as needed. For shortness of breath in between nebulizer treatments      losartan 50 MG tablet   Commonly known as: COZAAR   Take 50 mg by mouth every morning.      montelukast 10 MG tablet   Commonly known as: SINGULAIR   Take 10 mg by mouth at bedtime.      nitroGLYCERIN 0.4 MG SL tablet   Commonly  known as: NITROSTAT   Place 1 tablet (0.4 mg total) under the tongue every 5 (five) minutes x 3 doses as needed for chest pain.      pravastatin 80 MG tablet   Commonly known as: PRAVACHOL   Take 80 mg by mouth every evening.           Follow-up Information    Follow up with [facility] S, MD. Schedule an appointment as soon as possible for a visit in 1 week.   Contact information:   [address] [place] [address] [phone]          Signed: Ricki Rodriguez 02/07/2012, 5:22 PM

## 2012-02-07 NOTE — Progress Notes (Signed)
Patient evaluated for long-term disease management services with [facility]. However, patient currently off unit.  Raiford Noble, MSN-Ed, RN,BSN [facility] Liaison [phone]

## 2012-02-08 ENCOUNTER — Ambulatory Visit (INDEPENDENT_AMBULATORY_CARE_PROVIDER_SITE_OTHER): Payer: Medicare Other

## 2012-02-08 MED FILL — Dobutamine in Dextrose 5% Inj 1 MG/ML: INTRAVENOUS | Qty: 250 | Status: AC

## 2012-02-08 NOTE — Progress Notes (Signed)
Utilization Review Completed.   Binyamin Nelis, RN, BSN Nurse Case Manager  [phone]  

## 2012-02-09 ENCOUNTER — Ambulatory Visit (INDEPENDENT_AMBULATORY_CARE_PROVIDER_SITE_OTHER): Payer: Medicare Other | Admitting: Emergency Medicine

## 2012-02-09 VITALS — BP 156/76 | HR 101 | Temp 98.2°F | Resp 18 | Ht 60.0 in | Wt 118.4 lb

## 2012-02-09 DIAGNOSIS — L509 Urticaria, unspecified: Secondary | ICD-10-CM

## 2012-02-09 MED ORDER — PREDNISONE 10 MG PO TABS: ORAL_TABLET | ORAL | Status: DC

## 2012-02-09 NOTE — Progress Notes (Signed)
  Subjective:    Patient ID: Judith Boyer, female    DOB: [birth year], 67 y.o.   MRN: [id number]  Urticaria   Patient comes into our office with complaints of a rash that breaks out in different places worst where bra and pants fasten around chest and stomach patient also has a rash on the stomach and chest from were the adhesives was from her stress test. Hadn't had this reaction until the hospital gave her Delura    Review of Systems  Skin: Positive for rash.       Objective:   Physical Exam  Skin: Rash noted.          Assessment & Plan:

## 2012-02-09 NOTE — Progress Notes (Signed)
  Subjective:    Patient ID: Judith Boyer, female    DOB: 02-12-1945, 67 y.o.   MRN: [id number]  HPI patient enters for followup. She was hospitalized with chest pain. She had a nuclear study done in the hospital. Apparently they switched her to do lab and spray and after that she started having trouble with hives. She also is having a reaction to the area where they had her leads placed on her abdomen   Review of Systems     Objective:   Physical Exam there is increased AP diameter. There is decreased breath sounds in the bases. Cardiac exam has a regular rate without murmurs. The abdomen is soft nontender. Extremities without edema.  There are multiple bruises over both forearms. Areas of contact dermatitis on lower abdomen and isolated areas of hives on the extremities and trunk the      Assessment & Plan:  It sounds as though patient started with a reaction to that. She apparently is also allergic to the PCU. She is going to stay on Benadryl every 4-6 hours add Claritin 10 one a day and I have given her prednisone 10 mg 2 a day for 5 days

## 2012-02-29 ENCOUNTER — Telehealth: Payer: Self-pay

## 2012-02-29 ENCOUNTER — Ambulatory Visit (INDEPENDENT_AMBULATORY_CARE_PROVIDER_SITE_OTHER): Payer: Medicare Other | Admitting: Emergency Medicine

## 2012-02-29 ENCOUNTER — Encounter: Payer: Self-pay | Admitting: Emergency Medicine

## 2012-02-29 VITALS — BP 160/74 | HR 105 | Temp 97.9°F | Resp 18 | Ht 61.0 in | Wt 116.0 lb

## 2012-02-29 DIAGNOSIS — R0602 Shortness of breath: Secondary | ICD-10-CM

## 2012-02-29 DIAGNOSIS — R0902 Hypoxemia: Secondary | ICD-10-CM

## 2012-02-29 DIAGNOSIS — J4489 Other specified chronic obstructive pulmonary disease: Secondary | ICD-10-CM

## 2012-02-29 DIAGNOSIS — E785 Hyperlipidemia, unspecified: Secondary | ICD-10-CM

## 2012-02-29 DIAGNOSIS — J449 Chronic obstructive pulmonary disease, unspecified: Secondary | ICD-10-CM

## 2012-02-29 DIAGNOSIS — L509 Urticaria, unspecified: Secondary | ICD-10-CM

## 2012-02-29 MED ORDER — PRAVASTATIN SODIUM 80 MG PO TABS: 80.0000 mg | ORAL_TABLET | Freq: Every evening | ORAL | Status: DC

## 2012-02-29 MED ORDER — FLUTICASONE-SALMETEROL 500-50 MCG/DOSE IN AEPB: 1.0000 | INHALATION_SPRAY | Freq: Two times a day (BID) | RESPIRATORY_TRACT | Status: DC

## 2012-02-29 MED ORDER — IPRATROPIUM BROMIDE 0.02 % IN SOLN
0.5000 mg | Freq: Once | RESPIRATORY_TRACT | Status: AC
  Administered 2012-02-29: 0.5 mg via RESPIRATORY_TRACT

## 2012-02-29 MED ORDER — PREDNISONE 10 MG PO TABS: ORAL_TABLET | ORAL | Status: DC

## 2012-02-29 MED ORDER — ALBUTEROL SULFATE (2.5 MG/3ML) 0.083% IN NEBU
2.5000 mg | INHALATION_SOLUTION | Freq: Once | RESPIRATORY_TRACT | Status: AC
  Administered 2012-02-29: 2.5 mg via RESPIRATORY_TRACT

## 2012-02-29 NOTE — Progress Notes (Signed)
  Subjective:    Patient ID: Judith Boyer, female    DOB: 05-Apr-1945, 67 y.o.   MRN: [id number]  HPI 67 year old female presented with Shortness of Breathe, started this am at 4 am. She is having sinus drainage, cough and headache. Has not been smoking for the pass couple of days. She uses nebulizer at home.  Last used the nebulizer at 9 am and inhaler here in the office 30 mins ago.   Review of Systems     Objective:   Physical Exam patient is alert and able to speak without being short of breath. She has no pursing of her lips. She has an increased AP diameter. There were no audible rales. There is poor air exchange no true wheezes There is a red scaly yeast-appearing rash on the underside of both breasts.       Assessment & Plan:  I refilled her Advair . she is to continue to use either her Combivent or duo neb as needed. Her oxygen level is too high to be qualified for O2. I placed her on prednisone 40 a day for 4 days 30 a day for 4 days 20 a day for 4 days then 10 a day for 4 days. Her son lives next door. She was instructed that if she gets to the point where she cannot breathe she needs to call 911 and get assistance

## 2012-02-29 NOTE — Progress Notes (Deleted)
67 year old female presented with Shortness of Breathe, started this am at 4 am. She is having sinus drainage, cough and headache. Has not been smoking for the pass couple of days. She uses nebulizer at home.  Last used the nebulizer at 9 am and inhaler here in the office 30 mins ago.

## 2012-02-29 NOTE — Telephone Encounter (Signed)
Awaiting Dr Ellis Parents signature.

## 2012-02-29 NOTE — Telephone Encounter (Signed)
Pt would like to know when her ppw for her handicap placard is going to be finished please call her at [phone]

## 2012-02-29 NOTE — Telephone Encounter (Signed)
Called pt advised form ready to be pick up

## 2012-04-30 ENCOUNTER — Encounter: Payer: Self-pay | Admitting: Emergency Medicine

## 2012-05-02 ENCOUNTER — Telehealth: Payer: Self-pay | Admitting: Radiology

## 2012-05-02 ENCOUNTER — Ambulatory Visit (INDEPENDENT_AMBULATORY_CARE_PROVIDER_SITE_OTHER): Payer: Medicare Other | Admitting: Internal Medicine

## 2012-05-02 VITALS — BP 138/80 | HR 119 | Temp 98.5°F | Resp 16 | Ht 61.0 in | Wt 117.0 lb

## 2012-05-02 DIAGNOSIS — R05 Cough: Secondary | ICD-10-CM

## 2012-05-02 DIAGNOSIS — R0602 Shortness of breath: Secondary | ICD-10-CM

## 2012-05-02 DIAGNOSIS — J449 Chronic obstructive pulmonary disease, unspecified: Secondary | ICD-10-CM

## 2012-05-02 DIAGNOSIS — R059 Cough, unspecified: Secondary | ICD-10-CM

## 2012-05-02 DIAGNOSIS — J4489 Other specified chronic obstructive pulmonary disease: Secondary | ICD-10-CM

## 2012-05-02 DIAGNOSIS — R112 Nausea with vomiting, unspecified: Secondary | ICD-10-CM

## 2012-05-02 DIAGNOSIS — R197 Diarrhea, unspecified: Secondary | ICD-10-CM

## 2012-05-02 DIAGNOSIS — R000 Tachycardia, unspecified: Secondary | ICD-10-CM

## 2012-05-02 DIAGNOSIS — E860 Dehydration: Secondary | ICD-10-CM

## 2012-05-02 LAB — COMPREHENSIVE METABOLIC PANEL
ALT: 16 U/L (ref 0–35)
AST: 19 U/L (ref 0–37)
Calcium: 9.5 mg/dL (ref 8.4–10.5)
Chloride: 100 mEq/L (ref 96–112)
Creat: 0.55 mg/dL (ref 0.50–1.10)
Sodium: 136 mEq/L (ref 135–145)
Total Protein: 6.2 g/dL (ref 6.0–8.3)

## 2012-05-02 LAB — POCT CBC
Granulocyte percent: 75.4 %G (ref 37–80)
Hemoglobin: 13.4 g/dL (ref 12.2–16.2)
MCH, POC: 27.5 pg (ref 27–31.2)
MID (cbc): 0.3 (ref 0–0.9)
MPV: 11 fL (ref 0–99.8)
POC Granulocyte: 5.5 (ref 2–6.9)
POC MID %: 4.6 %M (ref 0–12)
Platelet Count, POC: 253 10*3/uL (ref 142–424)
RBC: 4.88 M/uL (ref 4.04–5.48)
WBC: 7.3 10*3/uL (ref 4.6–10.2)

## 2012-05-02 MED ORDER — ALBUTEROL SULFATE (2.5 MG/3ML) 0.083% IN NEBU: 2.5000 mg | INHALATION_SOLUTION | Freq: Once | RESPIRATORY_TRACT | Status: DC

## 2012-05-02 MED ORDER — IPRATROPIUM BROMIDE 0.02 % IN SOLN: 0.5000 mg | Freq: Once | RESPIRATORY_TRACT | Status: DC

## 2012-05-02 MED ORDER — PREDNISONE 10 MG PO TABS: ORAL_TABLET | ORAL | Status: DC

## 2012-05-02 NOTE — Telephone Encounter (Signed)
Faxed overnight pulse oximetry order to [facility] for patient.

## 2012-05-02 NOTE — Progress Notes (Signed)
[address] [place] [address]   Phone [phone]  Subjective:    Patient ID: Judith Boyer, female    DOB: 04-07-1945, 67 y.o.   MRN: [id number]  HPI  Pt presents to clinic with 1 day h/o SOB and difficulty breathing.  Over the last several days she has had diarrhea and then yesterday she got terrible nausea with vomiting.  Today the nausea and vomiting are much better but the diarrhea remains.  She is still having cramping abd pain that seems to be relieved after she has diarrhea.  She has not been able to keep any food down but has been trying to drink a little.  She has had no fevers or chills.  She has her normal cough but she is more SOB than normal for her. She used her Duoneb this am at 10am and it made her feel a little better.  She uses her Advair twice daily.  She is trying to quit smoking but is having a hard time.  She is trying the patches currently.  She lives alone.  She has increased SOB at night - sometimes so bad she cannot turn over.  Review of Systems  Constitutional: Negative for fever and chills.  Respiratory: Positive for cough (normal cough for her) and shortness of breath (worse than normal).   Gastrointestinal: Positive for nausea, vomiting and diarrhea.       Objective:   Physical Exam  Vitals reviewed. Constitutional: She is oriented to person, place, and time. She appears well-developed and well-nourished. She appears distressed (looks uncomfortable - standing up leaning on counter ).  Pt unable to lay flat - she spends most of the visit sitting up leaning over or standing leaning on the counter.    Cardiovascular: Normal heart sounds.  An irregular rhythm present. Tachycardia present.   Pt pulse on O2 after 1L NS was still 110s.  Pulmonary/Chest: Tachypnea (when talking pt only able to speak 3-4 words at a time - this improves while on O2.  ) noted. She is in respiratory distress (mild - improved only slightly after neb - much better on oxygen ).  She has decreased breath sounds (all lung fields - not much improvement after neb). She has no wheezes. She has no rhonchi. She has no rales.  Abdominal: Soft. There is tenderness (generalized discomfort).  Neurological: She is alert and oriented to person, place, and time.  Skin: Skin is warm and dry.  Psychiatric: She has a normal mood and affect. Her behavior is normal. Judgment and thought content normal.   Results for orders placed in visit on 05/02/12  POCT CBC      Result Value Range   WBC 7.3  4.6 - 10.2 K/uL   Lymph, poc 1.5  0.6 - 3.4   POC LYMPH PERCENT 20.0  10 - 50 %L   MID (cbc) 0.3  0 - 0.9   POC MID % 4.6  0 - 12 %M   POC Granulocyte 5.5  2 - 6.9   Granulocyte percent 75.4  37 - 80 %G   RBC 4.88  4.04 - 5.48 M/uL   Hemoglobin 13.4  12.2 - 16.2 g/dL   HCT, POC 21.3  08.6 - 47.9 %   MCV 89.7  80 - 97 fL   MCH, POC 27.5  27 - 31.2 pg   MCHC 30.6 (*) 31.8 - 35.4 g/dL   RDW, POC 57.8     Platelet Count, POC 253  142 -  424 K/uL   MPV 11.0  0 - 99.8 fL   EKG - tachycardia with frequent PACs, less PACs on rhythm strip (pt is reclining more)     Assessment & Plan:  SOB (shortness of breath) - I think that pt will benefit significantly from O2 at home, esp at night.  I think that an overnight pulse oximetry may show desaturation. For her current SOB partially caused by dehydration and COPD exacerbation.  Will start prednisone and pt will work on continued oral rehydration and advancing her diet.  Plan: EKG 12-Lead, predniSONE (DELTASONE) 10 MG tablet, POCT CBC, Pulse oximetry, overnight  Cough - Plan: albuterol (PROVENTIL) (2.5 MG/3ML) 0.083% nebulizer solution 2.5 mg, ipratropium (ATROVENT) nebulizer solution 0.5 mg  COPD (chronic obstructive pulmonary disease) - Plan: predniSONE (DELTASONE) 10 MG tablet, Pulse oximetry, overnight  Diarrhea - Try OTC Imodium.  Advance diet to allow stool to solidify.  Continue oral hydration.  Plan: Comprehensive metabolic panel  Nausea  with vomiting - Seems improved.  Plan: Comprehensive metabolic panel  Tachycardia - Not improved after 1L of fluids.  Plan: Pulse oximetry, overnight  Will consider adding Spriva.  Will talk with Dr Cleta Alberts about this.  Hope that her nighttime pulse oximetry indicates the need for home O2 because I think that the patient will benefit.  Pt was seen and evaluated and exam was also performed by Dr Perrin Maltese.

## 2012-05-02 NOTE — Patient Instructions (Signed)
Will set up night oximetry - F/u tomorrow if continued problems.  Will think about addition of Spriva.

## 2012-05-10 ENCOUNTER — Encounter: Payer: Self-pay | Admitting: Physician Assistant

## 2012-05-10 ENCOUNTER — Other Ambulatory Visit: Payer: Self-pay | Admitting: Physician Assistant

## 2012-05-10 DIAGNOSIS — J449 Chronic obstructive pulmonary disease, unspecified: Secondary | ICD-10-CM

## 2012-05-10 DIAGNOSIS — R0602 Shortness of breath: Secondary | ICD-10-CM

## 2012-05-15 ENCOUNTER — Telehealth: Payer: Self-pay | Admitting: Radiology

## 2012-05-15 NOTE — Telephone Encounter (Signed)
Have completed forms for home oxygen for her, faxed to [facility]. Judith Boyer

## 2012-06-10 ENCOUNTER — Encounter: Payer: Self-pay | Admitting: Physician Assistant

## 2012-06-29 ENCOUNTER — Ambulatory Visit: Payer: Medicare Other

## 2012-06-29 ENCOUNTER — Ambulatory Visit (INDEPENDENT_AMBULATORY_CARE_PROVIDER_SITE_OTHER): Payer: Medicare Other | Admitting: Emergency Medicine

## 2012-06-29 VITALS — BP 121/72 | HR 120 | Temp 98.2°F | Resp 20 | Ht 61.5 in | Wt 114.2 lb

## 2012-06-29 DIAGNOSIS — R05 Cough: Secondary | ICD-10-CM

## 2012-06-29 DIAGNOSIS — R52 Pain, unspecified: Secondary | ICD-10-CM

## 2012-06-29 DIAGNOSIS — J441 Chronic obstructive pulmonary disease with (acute) exacerbation: Secondary | ICD-10-CM

## 2012-06-29 DIAGNOSIS — J449 Chronic obstructive pulmonary disease, unspecified: Secondary | ICD-10-CM

## 2012-06-29 DIAGNOSIS — R059 Cough, unspecified: Secondary | ICD-10-CM

## 2012-06-29 DIAGNOSIS — R0602 Shortness of breath: Secondary | ICD-10-CM

## 2012-06-29 LAB — POCT CBC
Granulocyte percent: 84.4 %G — AB (ref 37–80)
Hemoglobin: 11.8 g/dL — AB (ref 12.2–16.2)
MCV: 92.6 fL (ref 80–97)
MID (cbc): 0.8 (ref 0–0.9)
MPV: 9.9 fL (ref 0–99.8)
POC MID %: 5 %M (ref 0–12)
Platelet Count, POC: 237 10*3/uL (ref 142–424)
RBC: 4.14 M/uL (ref 4.04–5.48)

## 2012-06-29 LAB — POCT INFLUENZA A/B: Influenza B, POC: NEGATIVE

## 2012-06-29 MED ORDER — CEFTRIAXONE SODIUM 1 G IJ SOLR
1.0000 g | Freq: Once | INTRAMUSCULAR | Status: AC
  Administered 2012-06-29: 1 g via INTRAMUSCULAR

## 2012-06-29 MED ORDER — IPRATROPIUM BROMIDE 0.02 % IN SOLN
0.5000 mg | Freq: Once | RESPIRATORY_TRACT | Status: AC
  Administered 2012-06-29: 0.5 mg via RESPIRATORY_TRACT

## 2012-06-29 MED ORDER — PREDNISONE 10 MG PO TABS: ORAL_TABLET | ORAL | Status: DC

## 2012-06-29 MED ORDER — ALBUTEROL SULFATE (2.5 MG/3ML) 0.083% IN NEBU
2.5000 mg | INHALATION_SOLUTION | Freq: Once | RESPIRATORY_TRACT | Status: AC
  Administered 2012-06-29: 2.5 mg via RESPIRATORY_TRACT

## 2012-06-29 MED ORDER — PREDNISOLONE SODIUM PHOSPHATE 15 MG/5ML PO SOLN
60.0000 mg | Freq: Once | ORAL | Status: AC
Start: 2012-06-29 — End: 2012-06-29
  Administered 2012-06-29: 60 mg via ORAL

## 2012-06-29 MED ORDER — LEVOFLOXACIN 500 MG PO TABS: 500.0000 mg | ORAL_TABLET | Freq: Every day | ORAL | Status: AC

## 2012-06-29 NOTE — Patient Instructions (Addendum)
Please follow up in 48 hours for recheck . Please call [phone] if you have any worsening in her condition. The patient understands and does not want to go to the hospital at the present time.

## 2012-06-29 NOTE — Progress Notes (Signed)
  Subjective:    Patient ID: Judith Boyer, female    DOB: 1945/07/18, 67 y.o.   MRN: [id number]  HPI 67 YO female patient comes in today with cold symptoms. She has been having difficulty breathing, cough since Friday evening. She has had a fever.  She started with a fever and chills. When she was laying down she felt like she was choking. She complains of a sinus headache and her eyes are hurting.   She has been using her neb's and sleeping with her O2 on at 2 Liters. No chest pain. She did have her flu shot this year in September.    Review of Systems     Objective:   Physical Exam patient is alert and cooperative but tachypnea today. Her TMs are clear nose is normal posterior pharynx is clear. Chest exam reveals significant increase in AP diameter. They're dry rub-like noises in the right upper lobe and left lower lobe. Breath sounds are symmetrical with very poor air exchange. Cardiac exam is tachycardia. There may exam is without edema Results for orders placed in visit on 06/29/12  POCT CBC      Result Value Range   WBC 16.7 (*) 4.6 - 10.2 K/uL   Lymph, poc 1.8  0.6 - 3.4   POC LYMPH PERCENT 10.6  10 - 50 %L   MID (cbc) 0.8  0 - 0.9   POC MID % 5.0  0 - 12 %M   POC Granulocyte 14.1 (*) 2 - 6.9   Granulocyte percent 84.4 (*) 37 - 80 %G   RBC 4.14  4.04 - 5.48 M/uL   Hemoglobin 11.8 (*) 12.2 - 16.2 g/dL   HCT, POC 40.9  81.1 - 47.9 %   MCV 92.6  80 - 97 fL   MCH, POC 28.5  27 - 31.2 pg   MCHC 30.8 (*) 31.8 - 35.4 g/dL   RDW, POC 91.4     Platelet Count, POC 237  142 - 424 K/uL   MPV 9.9  0 - 99.8 fL  POCT INFLUENZA A/B      Result Value Range   Influenza A, POC Negative     Influenza B, POC Negative     [facility] reading (PRIMARY) by  Dr.Ashdon Gillson severe COPD no definite pneumonia seen        Assessment & Plan:  Patient apparently picked up an illness from her son earlier in the week. She has a productive cough with worsening of her COPD. we'll treat with ceftriaxone 1  g. She will be given 60 mg prednisolone by mouth she is placed on a prednisone taper and Levaquin 500 one a day

## 2012-07-01 ENCOUNTER — Ambulatory Visit: Payer: Medicare Other

## 2012-07-01 ENCOUNTER — Ambulatory Visit (INDEPENDENT_AMBULATORY_CARE_PROVIDER_SITE_OTHER): Payer: Medicare Other | Admitting: Internal Medicine

## 2012-07-01 VITALS — BP 126/66 | HR 110 | Temp 97.7°F | Resp 18 | Ht 61.0 in | Wt 114.0 lb

## 2012-07-01 DIAGNOSIS — J189 Pneumonia, unspecified organism: Secondary | ICD-10-CM

## 2012-07-01 NOTE — Progress Notes (Signed)
   [address], [place] [address]   Phone [phone]  Subjective:    Patient ID: Judith Boyer, female    DOB: 1945/12/16, 67 y.o.   MRN: [id number]  HPI  Pt presents for recheck.  She is doing some better.  Her sputum is now yellow but there is less of it.  She [place] having some L chest wall pain with coughing -she has areas that are sore to the touch.  She has had them in her ribs before with her cough but this time she also has some in her upper L chest wall area under her axilla.  She felt some cramping last pm but that has resolved and now she just has a residual soreness.  She is tolerating her medications ok.   Review of Systems  Respiratory: Positive for cough, shortness of breath (no more than normal - she is using her oxygen more often since her illness) and wheezing (normal for her).        Objective:   Physical Exam  Vitals reviewed. Constitutional: She appears well-developed and well-nourished.  HENT:  Head: Normocephalic and atraumatic.  Right Ear: External ear normal.  Left Ear: External ear normal.  Eyes: Conjunctivae are normal.  Neck: Normal range of motion.  Cardiovascular: Normal rate, regular rhythm and normal heart sounds.   No murmur heard. Pulmonary/Chest: Effort normal. She has no wheezes. She has no rales.  Slightly decreased breath sounds on the left.  Pt sounds out of breath while talking but this is normal for her.  Skin: Skin is warm and dry.  Psychiatric: She has a normal mood and affect. Her behavior is normal. Judgment and thought content normal.    [facility] reading (PRIMARY) by  Dr. Perrin Maltese.  Improved interstitial markings.  No obvious PNA.      Assessment & Plan:  PNA (pneumonia) -clinically she is slightly improved.  She will continue her abx and pushing fluids and mucinex and f/u if her improvement does not continue. Plan: DG Chest 2 View  Benny Lennert [facility] 07/01/2012 2:49 PM

## 2012-08-08 ENCOUNTER — Ambulatory Visit (INDEPENDENT_AMBULATORY_CARE_PROVIDER_SITE_OTHER): Payer: Medicare Other | Admitting: Emergency Medicine

## 2012-08-08 VITALS — BP 132/64 | HR 110 | Temp 97.9°F | Resp 18 | Ht 61.5 in | Wt 113.0 lb

## 2012-08-08 DIAGNOSIS — E1110 Type 2 diabetes mellitus with ketoacidosis without coma: Secondary | ICD-10-CM

## 2012-08-08 DIAGNOSIS — R0602 Shortness of breath: Secondary | ICD-10-CM

## 2012-08-08 DIAGNOSIS — J449 Chronic obstructive pulmonary disease, unspecified: Secondary | ICD-10-CM

## 2012-08-08 DIAGNOSIS — E1310 Other specified diabetes mellitus with ketoacidosis without coma: Secondary | ICD-10-CM

## 2012-08-08 DIAGNOSIS — J441 Chronic obstructive pulmonary disease with (acute) exacerbation: Secondary | ICD-10-CM

## 2012-08-08 LAB — COMPREHENSIVE METABOLIC PANEL
AST: 14 U/L (ref 0–37)
Alkaline Phosphatase: 79 U/L (ref 39–117)
BUN: 9 mg/dL (ref 6–23)
Calcium: 9.6 mg/dL (ref 8.4–10.5)
Chloride: 102 mEq/L (ref 96–112)
Creat: 0.59 mg/dL (ref 0.50–1.10)
Total Bilirubin: 0.2 mg/dL — ABNORMAL LOW (ref 0.3–1.2)

## 2012-08-08 LAB — LIPID PANEL
Cholesterol: 162 mg/dL (ref 0–200)
HDL: 45 mg/dL (ref >39)
Total CHOL/HDL Ratio: 3.6 Ratio
Triglycerides: 142 mg/dL (ref <150)
VLDL: 28 mg/dL (ref 0–40)

## 2012-08-08 LAB — POCT CBC
HCT, POC: 40.9 % (ref 37.7–47.9)
Hemoglobin: 12.5 g/dL (ref 12.2–16.2)
Lymph, poc: 1.9 (ref 0.6–3.4)
MCH, POC: 28.6 pg (ref 27–31.2)
MCV: 93.5 fL (ref 80–97)
RBC: 4.37 M/uL (ref 4.04–5.48)
WBC: 7.8 10*3/uL (ref 4.6–10.2)

## 2012-08-08 MED ORDER — FLUTICASONE-SALMETEROL 500-50 MCG/DOSE IN AEPB: 1.0000 | INHALATION_SPRAY | Freq: Two times a day (BID) | RESPIRATORY_TRACT | Status: DC

## 2012-08-08 MED ORDER — IPRATROPIUM-ALBUTEROL 18-103 MCG/ACT IN AERO: 2.0000 | INHALATION_SPRAY | Freq: Four times a day (QID) | RESPIRATORY_TRACT | Status: DC | PRN

## 2012-08-08 MED ORDER — IPRATROPIUM-ALBUTEROL 20-100 MCG/ACT IN AERS: 1.0000 | INHALATION_SPRAY | Freq: Three times a day (TID) | RESPIRATORY_TRACT | Status: DC

## 2012-08-08 MED ORDER — PREDNISONE 10 MG PO TABS: ORAL_TABLET | ORAL | Status: DC

## 2012-08-08 NOTE — Progress Notes (Signed)
  Subjective:    Patient ID: Judith Boyer, female    DOB: 04-21-1945, 67 y.o.   MRN: [id number]  HPIPatient comes into our office for medicine refill Uses inhaler has COPD Patient also has some small bumps on her arms probably due from DM and predisone    Review of Systems     Objective:   Physical Exam patient is alert and cooperative she does not appear in respiratory distress. She is a marked increase in the AP diameter of her chest. Breath sounds are distant. No audible wheezes are heard. Cardiac exam is a regular rate without murmurs rubs or gallops.  Results for orders placed in visit on 08/08/12  POCT CBC      Result Value Range   WBC 7.8  4.6 - 10.2 K/uL   Lymph, poc 1.9  0.6 - 3.4   POC LYMPH PERCENT 24.7  10 - 50 %L   MID (cbc) 0.4  0 - 0.9   POC MID % 5.4  0 - 12 %M   POC Granulocyte 5.5  2 - 6.9   Granulocyte percent 69.9  37 - 80 %G   RBC 4.37  4.04 - 5.48 M/uL   Hemoglobin 12.5  12.2 - 16.2 g/dL   HCT, POC 95.6  21.3 - 47.9 %   MCV 93.5  80 - 97 fL   MCH, POC 28.6  27 - 31.2 pg   MCHC 30.6 (*) 31.8 - 35.4 g/dL   RDW, POC 08.6     Platelet Count, POC 234  142 - 424 K/uL   MPV 10.1  0 - 99.8 fL  GLUCOSE, POCT (MANUAL RESULT ENTRY)      Result Value Range   POC Glucose 185 (*) 70 - 99 mg/dl  POCT GLYCOSYLATED HEMOGLOBIN (HGB A1C)      Result Value Range   Hemoglobin A1C 7.2          Assessment & Plan:

## 2012-09-06 ENCOUNTER — Encounter: Payer: Self-pay | Admitting: Emergency Medicine

## 2012-09-06 ENCOUNTER — Ambulatory Visit (INDEPENDENT_AMBULATORY_CARE_PROVIDER_SITE_OTHER): Payer: Medicare Other | Admitting: Emergency Medicine

## 2012-09-06 VITALS — BP 122/62 | HR 96 | Temp 98.1°F | Resp 20 | Ht 60.75 in | Wt 113.2 lb

## 2012-09-06 DIAGNOSIS — J449 Chronic obstructive pulmonary disease, unspecified: Secondary | ICD-10-CM

## 2012-09-06 DIAGNOSIS — I471 Supraventricular tachycardia: Secondary | ICD-10-CM

## 2012-09-06 NOTE — Progress Notes (Signed)
  Subjective:    Patient ID: Judith Boyer, female    DOB: 1945-02-19, 67 y.o.   MRN: [id number]  HPI Pt states combivent doesn't work. Patient wants to see if she can change her Combivent to something else that works better. When she changed to the powder form she does not get nearly the results she wants. She also has been having a tachycardia and seen Dr. Algie Coffer. She was recently placed on a beta blocker and diltiazem. Her breathing status has been stable     Review of Systems     Objective:   Physical Exam patient is alert and cooperative in no distress. Her neck is supple. Her chest is clear to auscultation and percussion. Her heart has a regular rate without murmurs        Assessment & Plan:  Patient here with severe P. COPD. She is ordered a Combivent inhaler which is coming from [place]. Was switched to regular Atrovent inhaler and albuterol inhaler until her new prescription arrives

## 2012-09-10 ENCOUNTER — Other Ambulatory Visit: Payer: Self-pay

## 2012-09-12 ENCOUNTER — Ambulatory Visit (INDEPENDENT_AMBULATORY_CARE_PROVIDER_SITE_OTHER): Payer: Medicare Other | Admitting: Emergency Medicine

## 2012-09-12 VITALS — BP 122/56 | HR 98 | Temp 98.0°F | Resp 18 | Ht 60.5 in | Wt 112.0 lb

## 2012-09-12 DIAGNOSIS — IMO0001 Reserved for inherently not codable concepts without codable children: Secondary | ICD-10-CM

## 2012-09-12 DIAGNOSIS — J449 Chronic obstructive pulmonary disease, unspecified: Secondary | ICD-10-CM

## 2012-09-12 DIAGNOSIS — R0602 Shortness of breath: Secondary | ICD-10-CM

## 2012-09-12 DIAGNOSIS — J441 Chronic obstructive pulmonary disease with (acute) exacerbation: Secondary | ICD-10-CM

## 2012-09-12 MED ORDER — METFORMIN HCL 500 MG PO TABS: ORAL_TABLET | ORAL | Status: DC

## 2012-09-12 MED ORDER — PREDNISONE 10 MG PO TABS: ORAL_TABLET | ORAL | Status: DC

## 2012-09-12 NOTE — Progress Notes (Signed)
  Subjective:    Patient ID: Kathe Becton, female    DOB: 1945/07/07, 67 y.o.   MRN: [id number]  HPI  67 y.o female presents to clinic today with shortness of breath that has been worsening since Monday. Has been using inhailers to help with symptoms.  Pateints has been doing duoneb treatments and feels they haven't been holding out until the next treatment . Notices wheezing, headaches, and some dizziness upon standing.  Denies any chest pains, or pain in arms or neck .    Noticing trouble with blood sugars running higher then normal.  Review of Systems     Objective:   Physical Exam there is increased AP diameter. Decreased breath sounds in the bases but no true wheezes. Cardiac exam reveals distant heart sounds with no murmurs and no gallop. Abdomen is soft  Results for orders placed in visit on 09/12/12  GLUCOSE, POCT (MANUAL RESULT ENTRY)      Result Value Range   POC Glucose 240 (*) 70 - 99 mg/dl        Assessment & Plan:  Sugars not under good control Will increase metformin to 2 in the morning and one at night. Have put her on a prednisone taper 40 [id number] 3020 2020 10:10 PM. She will followup with Dr. Perrin Maltese while I am gone in case she has any difficulty.

## 2012-10-18 ENCOUNTER — Ambulatory Visit (INDEPENDENT_AMBULATORY_CARE_PROVIDER_SITE_OTHER): Payer: Medicare Other | Admitting: Emergency Medicine

## 2012-10-18 VITALS — BP 118/50 | HR 111 | Temp 98.6°F | Resp 18 | Ht 61.0 in | Wt 111.0 lb

## 2012-10-18 DIAGNOSIS — J449 Chronic obstructive pulmonary disease, unspecified: Secondary | ICD-10-CM

## 2012-10-18 DIAGNOSIS — I10 Essential (primary) hypertension: Secondary | ICD-10-CM

## 2012-10-18 DIAGNOSIS — I2510 Atherosclerotic heart disease of native coronary artery without angina pectoris: Secondary | ICD-10-CM

## 2012-10-18 DIAGNOSIS — E119 Type 2 diabetes mellitus without complications: Secondary | ICD-10-CM

## 2012-10-18 DIAGNOSIS — R0602 Shortness of breath: Secondary | ICD-10-CM

## 2012-10-18 DIAGNOSIS — Z23 Encounter for immunization: Secondary | ICD-10-CM

## 2012-10-18 MED ORDER — CLOPIDOGREL BISULFATE 75 MG PO TABS: 75.0000 mg | ORAL_TABLET | Freq: Every morning | ORAL | Status: DC

## 2012-10-18 MED ORDER — LOSARTAN POTASSIUM 50 MG PO TABS: 50.0000 mg | ORAL_TABLET | Freq: Every morning | ORAL | Status: DC

## 2012-10-18 MED ORDER — METFORMIN HCL 500 MG PO TABS: ORAL_TABLET | ORAL | Status: DC

## 2012-10-18 MED ORDER — ALBUTEROL SULFATE (2.5 MG/3ML) 0.083% IN NEBU: 2.5000 mg | INHALATION_SOLUTION | Freq: Four times a day (QID) | RESPIRATORY_TRACT | Status: DC | PRN

## 2012-10-18 MED ORDER — IPRATROPIUM-ALBUTEROL 0.5-2.5 (3) MG/3ML IN SOLN: 3.0000 mL | Freq: Three times a day (TID) | RESPIRATORY_TRACT | Status: DC

## 2012-10-18 MED ORDER — PREDNISONE 10 MG PO TABS: ORAL_TABLET | ORAL | Status: DC

## 2012-10-18 MED ORDER — METFORMIN HCL 1000 MG PO TABS: ORAL_TABLET | ORAL | Status: DC

## 2012-10-18 MED ORDER — MONTELUKAST SODIUM 10 MG PO TABS: 10.0000 mg | ORAL_TABLET | Freq: Every day | ORAL | Status: DC

## 2012-10-18 NOTE — Progress Notes (Signed)
  Subjective:    Patient ID: Judith Boyer, female    DOB: Jun 19, 1945, 67 y.o.   MRN: [id number]  HPI presents today with difficulty breathing. More so then usual. She uses oxygen therapy at night but not during day for COPD. She hasn't been feeling well and hasn't been going anywhere.   She has been having swelling in both feet at night.   She no longer takes metoprolol due to BP being to low. Her cardiologist took her off.  Needs refills on medications plavix, metformin, losartan, singulair, and duoneb  Gets flu vaccine ever year and wants to get one today    Review of Systems     Objective:   Physical Exam        Assessment & Plan:

## 2012-11-13 ENCOUNTER — Ambulatory Visit (INDEPENDENT_AMBULATORY_CARE_PROVIDER_SITE_OTHER): Payer: Medicare Other | Admitting: Emergency Medicine

## 2012-11-13 VITALS — BP 142/54 | HR 120 | Temp 97.9°F | Resp 20 | Ht 60.5 in | Wt 109.0 lb

## 2012-11-13 DIAGNOSIS — J449 Chronic obstructive pulmonary disease, unspecified: Secondary | ICD-10-CM

## 2012-11-13 DIAGNOSIS — J441 Chronic obstructive pulmonary disease with (acute) exacerbation: Secondary | ICD-10-CM

## 2012-11-13 DIAGNOSIS — R0602 Shortness of breath: Secondary | ICD-10-CM

## 2012-11-13 LAB — POCT CBC
HCT, POC: 40.4 % (ref 37.7–47.9)
Hemoglobin: 12.4 g/dL (ref 12.2–16.2)
Lymph, poc: 3.7 — AB (ref 0.6–3.4)
MCH, POC: 29.3 pg (ref 27–31.2)
MCHC: 30.7 g/dL — AB (ref 31.8–35.4)
MPV: 10 fL (ref 0–99.8)
POC Granulocyte: 8.5 — AB (ref 2–6.9)
POC LYMPH PERCENT: 28.6 %L (ref 10–50)
POC MID %: 5.2 %M (ref 0–12)
RDW, POC: 13.6 %
WBC: 12.8 10*3/uL — AB (ref 4.6–10.2)

## 2012-11-13 LAB — GLUCOSE, POCT (MANUAL RESULT ENTRY): POC Glucose: 249 mg/dl — AB (ref 70–99)

## 2012-11-13 MED ORDER — PREDNISONE 10 MG PO TABS: ORAL_TABLET | ORAL | Status: DC

## 2012-11-13 NOTE — Progress Notes (Deleted)
  Subjective:    Patient ID: Judith Boyer, female    DOB: 1945/08/28, 67 y.o.   MRN: [id number]  HPI    Review of Systems     Objective:   Physical Exam        Assessment & Plan:
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
## 2012-11-13 NOTE — Progress Notes (Deleted)
  Subjective:    Patient ID: Judith Boyer, female    DOB: 12/06/1945, 67 y.o.   MRN: [id number]  HPI    Review of Systems     Objective:   Physical Exam        Assessment & Plan:   

## 2012-12-11 ENCOUNTER — Other Ambulatory Visit: Payer: Self-pay

## 2012-12-26 ENCOUNTER — Encounter: Payer: Self-pay | Admitting: Emergency Medicine

## 2012-12-26 ENCOUNTER — Ambulatory Visit (INDEPENDENT_AMBULATORY_CARE_PROVIDER_SITE_OTHER): Payer: Medicare Other | Admitting: Emergency Medicine

## 2012-12-26 VITALS — BP 130/70 | HR 121 | Temp 98.2°F | Resp 18

## 2012-12-26 DIAGNOSIS — B373 Candidiasis of vulva and vagina: Secondary | ICD-10-CM

## 2012-12-26 DIAGNOSIS — J019 Acute sinusitis, unspecified: Secondary | ICD-10-CM

## 2012-12-26 DIAGNOSIS — J4489 Other specified chronic obstructive pulmonary disease: Secondary | ICD-10-CM

## 2012-12-26 DIAGNOSIS — J449 Chronic obstructive pulmonary disease, unspecified: Secondary | ICD-10-CM

## 2012-12-26 DIAGNOSIS — J441 Chronic obstructive pulmonary disease with (acute) exacerbation: Secondary | ICD-10-CM

## 2012-12-26 DIAGNOSIS — B3731 Acute candidiasis of vulva and vagina: Secondary | ICD-10-CM

## 2012-12-26 DIAGNOSIS — E119 Type 2 diabetes mellitus without complications: Secondary | ICD-10-CM

## 2012-12-26 DIAGNOSIS — R0602 Shortness of breath: Secondary | ICD-10-CM

## 2012-12-26 DIAGNOSIS — R079 Chest pain, unspecified: Secondary | ICD-10-CM

## 2012-12-26 LAB — POCT CBC
HCT, POC: 38.5 % (ref 37.7–47.9)
Hemoglobin: 11.6 g/dL — AB (ref 12.2–16.2)
Lymph, poc: 2 (ref 0.6–3.4)
MCH, POC: 28.2 pg (ref 27–31.2)
MCHC: 30.1 g/dL — AB (ref 31.8–35.4)
MCV: 93.7 fL (ref 80–97)
POC Granulocyte: 10 — AB (ref 2–6.9)
POC LYMPH PERCENT: 15.4 %L (ref 10–50)
Platelet Count, POC: 343 10*3/uL (ref 142–424)
RDW, POC: 14 %
WBC: 12.9 10*3/uL — AB (ref 4.6–10.2)

## 2012-12-26 LAB — POCT GLYCOSYLATED HEMOGLOBIN (HGB A1C): Hemoglobin A1C: 7.7

## 2012-12-26 LAB — COMPREHENSIVE METABOLIC PANEL
ALT: 13 U/L (ref 0–35)
Alkaline Phosphatase: 73 U/L (ref 39–117)
BUN: 8 mg/dL (ref 6–23)
CO2: 27 mEq/L (ref 19–32)
Glucose, Bld: 160 mg/dL — ABNORMAL HIGH (ref 70–99)
Sodium: 135 mEq/L (ref 135–145)
Total Bilirubin: 0.2 mg/dL — ABNORMAL LOW (ref 0.3–1.2)
Total Protein: 6 g/dL (ref 6.0–8.3)

## 2012-12-26 LAB — GLUCOSE, POCT (MANUAL RESULT ENTRY): POC Glucose: 196 mg/dl — AB (ref 70–99)

## 2012-12-26 MED ORDER — FLUCONAZOLE 150 MG PO TABS: 150.0000 mg | ORAL_TABLET | Freq: Once | ORAL | Status: DC

## 2012-12-26 MED ORDER — AMOXICILLIN 875 MG PO TABS: 875.0000 mg | ORAL_TABLET | Freq: Two times a day (BID) | ORAL | Status: DC

## 2012-12-26 MED ORDER — PREDNISONE 10 MG PO TABS: ORAL_TABLET | ORAL | Status: DC

## 2012-12-26 NOTE — Progress Notes (Signed)
  Subjective:    Patient ID: Judith Boyer, female    DOB: [birth year], 67 y.o.   MRN: [id number]  HPI patient was in her usual state of health until last night when she started feeling more short of breath. She noticed she was becoming increasingly tight. She overall has been doing well. She last saw Dr. Sharyn Lull about 6 weeks ago for followup of her cardiac problems. She used a nebulizer at 10:30 this morning with some improvement. She has severe O2 dependent COPD. She continues to smoke. She also suffers from diabetes as well as coronary disease. She also has a periodic nasal drainage from the right. She also has a productive cough.    Review of Systems     Objective:   Physical Exam patient is tachypneic. Her neck is supple. She is tachycardic without murmur. Breath sounds are symmetrical. There are decreased breath sounds in both bases with poor air exchange. Extremities show trace edema. Results for orders placed in visit on 12/26/12  GLUCOSE, POCT (MANUAL RESULT ENTRY)      Result Value Range   POC Glucose 196 (*) 70 - 99 mg/dl  POCT GLYCOSYLATED HEMOGLOBIN (HGB A1C)      Result Value Range   Hemoglobin A1C 7.7    POCT CBC      Result Value Range   WBC 12.9 (*) 4.6 - 10.2 K/uL   Lymph, poc 2.0  0.6 - 3.4   POC LYMPH PERCENT 15.4  10 - 50 %L   MID (cbc) 0.9  0 - 0.9   POC MID % 6.9  0 - 12 %M   POC Granulocyte 10.0 (*) 2 - 6.9   Granulocyte percent 77.7  37 - 80 %G   RBC 4.11  4.04 - 5.48 M/uL   Hemoglobin 11.6 (*) 12.2 - 16.2 g/dL   HCT, POC 08.6  57.8 - 47.9 %   MCV 93.7  80 - 97 fL   MCH, POC 28.2  27 - 31.2 pg   MCHC 30.1 (*) 31.8 - 35.4 g/dL   RDW, POC 46.9     Platelet Count, POC 343  142 - 424 K/uL   MPV 9.7  0 - 99.8 fL   EKG sinus tachycardia old anterior infarct      Assessment & Plan:  Patient placed on O2 nebulizer treatment was started. Patient improved with O2 and nebulizer. We'll treat her sinus infection with amoxicillin 875 twice a day. She will be  on a prednisone taper and she was given a glucometer to check her sugars. She was also given a prescription for Diflucan to have for her recurrent yeast infections. Her white count is elevated at 12 9. Patient knows she needs to go to the emergency  room if she has worsening because she would need to be admitted. She is comfortable with this plan .

## 2012-12-27 ENCOUNTER — Telehealth: Payer: Self-pay | Admitting: Emergency Medicine

## 2012-12-27 NOTE — Telephone Encounter (Deleted)
:   Check on status. Be sure patient is improving if she is not responding to treatment she needs to go to the emergency room as previously instucted

## 2012-12-27 NOTE — Telephone Encounter (Signed)
ERROR

## 2012-12-29 ENCOUNTER — Telehealth: Payer: Self-pay | Admitting: Family Medicine

## 2012-12-29 NOTE — Telephone Encounter (Signed)
Spoke with patient patient is doing ok and waiting on referral

## 2012-12-30 ENCOUNTER — Encounter: Payer: Self-pay | Admitting: Internal Medicine

## 2012-12-30 ENCOUNTER — Ambulatory Visit (INDEPENDENT_AMBULATORY_CARE_PROVIDER_SITE_OTHER): Payer: Medicare Other | Admitting: Internal Medicine

## 2012-12-30 ENCOUNTER — Ambulatory Visit (INDEPENDENT_AMBULATORY_CARE_PROVIDER_SITE_OTHER)
Admission: RE | Admit: 2012-12-30 | Discharge: 2012-12-30 | Disposition: A | Discharge status: Home / Self Care | Payer: Medicare Other | Source: Ambulatory Visit | Attending: Internal Medicine | Admitting: Internal Medicine

## 2012-12-30 VITALS — BP 130/68 | HR 119 | Temp 98.1°F | Ht 61.0 in | Wt 108.4 lb

## 2012-12-30 DIAGNOSIS — J449 Chronic obstructive pulmonary disease, unspecified: Secondary | ICD-10-CM

## 2012-12-30 DIAGNOSIS — R0902 Hypoxemia: Secondary | ICD-10-CM

## 2012-12-30 DIAGNOSIS — G4734 Idiopathic sleep related nonobstructive alveolar hypoventilation: Secondary | ICD-10-CM | POA: Insufficient documentation

## 2012-12-30 DIAGNOSIS — F17200 Nicotine dependence, unspecified, uncomplicated: Secondary | ICD-10-CM

## 2012-12-30 NOTE — Assessment & Plan Note (Signed)
-   night-time oximetry done on 05/07/2012 desat x 39 min <89%

## 2012-12-30 NOTE — Progress Notes (Signed)
  Subjective:    Patient ID: Judith Boyer, female    DOB: 1946-01-05 MRN: [id number]  HPI   42 yowf active smoker with onset of sob x around early 2000s on daily meds since then and much worse since fall 2014 referred 12/30/2012 to pulmonary clinic by Dr Cleta Alberts for copd eval   12/30/2012 1st [facility] office visit/ Wert cc doe x 10 years much worse x sev months assoc with purulent sputum not better on amox now ond day #  4/10 d for sinus infection and on pred now. On max dose advair and already took neb and combivent on day of ov and "no better" with nasal congestion as well.   No obvious day to day or daytime variabilty or assoc chronic cough or cp or chest tightness, subjective wheeze overt   hb symptoms. No unusual exp hx or h/o childhood pna/ asthma or knowledge of premature birth.  Sleeping ok without nocturnal  or early am exacerbation  of respiratory  c/o's or need for noct saba. Also denies any obvious fluctuation of symptoms with weather or environmental changes or other aggravating or alleviating factors except as outlined above   Current Medications, Allergies, Complete Past Medical History, Past Surgical History, Family History, and Social History were reviewed in [employer] record.            Review of Systems  Constitutional: Negative for fever, chills and unexpected weight change.  HENT: Positive for congestion. Negative for dental problem, ear pain, nosebleeds, postnasal drip, rhinorrhea, sinus pressure, sneezing, sore throat, trouble swallowing and voice change.   Eyes: Negative for visual disturbance.  Respiratory: Positive for cough and shortness of breath. Negative for choking.   Cardiovascular: Negative for chest pain and leg swelling.  Gastrointestinal: Negative for vomiting, abdominal pain and diarrhea.  Genitourinary: Negative for difficulty urinating.  Musculoskeletal: Negative for arthralgias.  Skin: Negative for rash.   Neurological: Negative for tremors, syncope and headaches.  Hematological: Does not bruise/bleed easily.       Objective:   Physical Exam  amb wf nad  Wt Readings from Last 3 Encounters:  12/30/12 108 lb 6.4 oz (49.17 kg)  11/13/12 109 lb (49.442 kg)  10/18/12 111 lb (50.349 kg)      HEENT mild turbinate edema.  Oropharynx no thrush or excess pnd or cobblestoning.  No JVD or cervical adenopathy. Mild accessory muscle hypertrophy. Trachea midline, nl thryroid. Chest was hyperinflated by percussion with diminished breath sounds and moderate increased exp time without wheeze. Hoover sign positive at mid inspiration. Regular rate and rhythm without murmur gallop or rub or increase P2 or edema.  Abd: no hsm, nl excursion. Ext warm without cyanosis or clubbing.      CXR  12/30/2012 : COPD without evidence of acute cardiopulmonary disease     Assessment & Plan:

## 2012-12-30 NOTE — Patient Instructions (Signed)
Stop advair  Start dulera 200 Take 2 puffs first thing in am and then another 2 puffs about 12 hours later.   Continue duoneb 4 x daily and the [place] Combivent is only if needed  The key is to stop smoking completely before smoking completely stops you!   Please remember to go to the x-ray department downstairs for your tests - we will call you with the results when they are available.    Please schedule a follow up office visit in 2  weeks, sooner if needed

## 2012-12-31 NOTE — Assessment & Plan Note (Signed)

## 2012-12-31 NOTE — Assessment & Plan Note (Addendum)
-   12/30/2012  Walked RA x 3 laps @ 185 ft each stopped due to legs shaky, sats 94%  DDX of  difficult airways managment all start with A and  include Adherence, Ace Inhibitors, Acid Reflux, Active Sinus Disease, Alpha 1 Antitripsin deficiency, Anxiety masquerading as Airways dz,  ABPA,  allergy(esp in young), Aspiration (esp in elderly), Adverse effects of DPI,  Active smokers, plus two Bs  = Bronchiectasis and Beta blocker use..and one C= CHF   Adherence is always the initial "prime suspect" and is a multilayered concern that requires a "trust but verify" approach in every patient - starting with knowing how to use medications, especially inhalers, correctly, keeping up with refills and understanding the fundamental difference between maintenance and prns vs those medications only taken for a very short course and then stopped and not refilled.  - The proper method of use, as well as anticipated side effects, of a metered-dose inhaler are discussed and demonstrated to the patient. Improved effectiveness after extensive coaching during this visit to a level of approximately  90% so try dulera 200 2bid samples  Active smoking obviously the major problem > discussed separately  ? Adverse effect of dpi, esp advair in high doses > d/c   ? Allergies/ asthma > strongly doubt but ok for now to continue singulair as certainly not hurting her    Each maintenance medication was reviewed in detail including most importantly the difference between maintenance and as needed and under what circumstances the prns are to be used.  Please see instructions for details which were reviewed in writing and the patient given a copy.   Once returns for pfts may consider change from [facility]  To [facility] but in meantime just use duoneb qid

## 2013-01-09 ENCOUNTER — Ambulatory Visit (INDEPENDENT_AMBULATORY_CARE_PROVIDER_SITE_OTHER): Payer: Medicare Other | Admitting: Emergency Medicine

## 2013-01-09 ENCOUNTER — Ambulatory Visit: Payer: Medicare Other

## 2013-01-09 VITALS — BP 144/80 | HR 113 | Temp 98.1°F | Resp 22 | Ht 61.0 in | Wt 107.8 lb

## 2013-01-09 DIAGNOSIS — R0602 Shortness of breath: Secondary | ICD-10-CM

## 2013-01-09 DIAGNOSIS — R05 Cough: Secondary | ICD-10-CM

## 2013-01-09 DIAGNOSIS — J329 Chronic sinusitis, unspecified: Secondary | ICD-10-CM

## 2013-01-09 DIAGNOSIS — E119 Type 2 diabetes mellitus without complications: Secondary | ICD-10-CM

## 2013-01-09 DIAGNOSIS — R059 Cough, unspecified: Secondary | ICD-10-CM

## 2013-01-09 DIAGNOSIS — J449 Chronic obstructive pulmonary disease, unspecified: Secondary | ICD-10-CM

## 2013-01-09 DIAGNOSIS — J441 Chronic obstructive pulmonary disease with (acute) exacerbation: Secondary | ICD-10-CM

## 2013-01-09 MED ORDER — AMOXICILLIN-POT CLAVULANATE 875-125 MG PO TABS: 1.0000 | ORAL_TABLET | Freq: Two times a day (BID) | ORAL | Status: DC

## 2013-01-09 MED ORDER — ALBUTEROL SULFATE HFA 108 (90 BASE) MCG/ACT IN AERS: 2.0000 | INHALATION_SPRAY | RESPIRATORY_TRACT | Status: DC | PRN

## 2013-01-09 MED ORDER — PREDNISONE 10 MG PO TABS: ORAL_TABLET | ORAL | Status: DC

## 2013-01-09 NOTE — Progress Notes (Addendum)
Subjective:    Patient ID: Judith Boyer, female    DOB: [birth year], 67 y.o.   MRN: [id number]  This chart was scribed for Collene Gobble, MD by Dorothey Baseman, ED Scribe.   Chief Complaint  Patient presents with   Follow-up    No better - cough more;     HPI Judith Boyer is a 66 y.o. Female with a history of COPD who presents to [facility] complaining of a persistent cough with associated postnasal drip and headache. She reports using her nebulizer treatment at home, around 2.5 hours ago, without relief. She also reports taking Benadryl and Mucinex at home without relief of her symptoms, but she states that it made her feel "weird." Patient was seen here previously for similar complaints and was started on a course of amoxicillin, which she states she finished the course, but it has not been effective at relieving her symptoms. She was also recently taken off of Advair and started on Dulera. Patient reports that she is also stopping her course of prednisone, last dose is scheduled to be tomorrow. Patient also reports that her shortness of breath has been normal for her secondary to the COPD and denies any recent changes. She denies fever, chills. Patient also has a history of HTN, DM, and CAD.   Patient is also requesting a refill of her rescue inhaler.  Past Medical History  Diagnosis Date   Myocardial infarction    COPD (chronic obstructive pulmonary disease)    Hypertension    Coronary artery disease    Shortness of breath    Diabetes mellitus without complication     type 2   Current Outpatient Prescriptions on File Prior to Visit  Medication Sig Dispense Refill   albuterol (PROVENTIL) (2.5 MG/3ML) 0.083% nebulizer solution Take 3 mLs (2.5 mg total) by nebulization every 6 (six) hours as needed for wheezing.  150 mL  5   albuterol-ipratropium (COMBIVENT) 18-103 MCG/ACT inhaler Inhale 2 puffs into the lungs every 6 (six) hours as needed. For  shortness of breath in between nebulizer treatments  1 Inhaler  11   aspirin 325 MG EC tablet Take 325 mg by mouth every morning.       clopidogrel (PLAVIX) 75 MG tablet Take 1 tablet (75 mg total) by mouth every morning.  30 tablet  6   dextromethorphan-guaiFENesin (MUCINEX DM) 30-600 MG per 12 hr tablet Take 1 tablet by mouth every 12 (twelve) hours as needed. For cough       diltiazem (CARDIZEM) 30 MG tablet Take 30 mg by mouth 2 (two) times daily.       ipratropium-albuterol (DUONEB) 0.5-2.5 (3) MG/3ML SOLN Take 3 mLs by nebulization 3 (three) times daily. At 9AM, 3PM, and 10PM. Additional dose if necessary  360 mL  6   losartan (COZAAR) 50 MG tablet Take 1 tablet (50 mg total) by mouth every morning.  30 tablet  6   metFORMIN (GLUCOPHAGE) 1000 MG tablet Take 1 tablet in the morning and 1 tablet in the evening  60 tablet  6   montelukast (SINGULAIR) 10 MG tablet Take 1 tablet (10 mg total) by mouth at bedtime.  30 tablet  6   nitroGLYCERIN (NITROSTAT) 0.4 MG SL tablet Place 1 tablet (0.4 mg total) under the tongue every 5 (five) minutes x 3 doses as needed for chest pain.  25 tablet  1   pravastatin (PRAVACHOL) 80 MG tablet Take 1 tablet (80 mg  total) by mouth every evening.  30 tablet  11   predniSONE (DELTASONE) 10 MG tablet Take 4 tablets a day for 4 days 3 tablets a day for 4 days 2 tablets a day for 4 days one tablet a day for 4 days  40 tablet  0   amoxicillin (AMOXIL) 875 MG tablet Take 1 tablet (875 mg total) by mouth 2 (two) times daily.  20 tablet  0   Current Facility-Administered Medications on File Prior to Visit  Medication Dose Route Frequency Provider Last Rate Last Dose   albuterol (PROVENTIL) (2.5 MG/3ML) 0.083% nebulizer solution 2.5 mg  2.5 mg Nebulization Once Morrell Riddle, PA-C       Allergies  Allergen Reactions   Doxycycline Anaphylaxis   Codeine Other (See Comments)    hyperactivity   Lisinopril Cough   Adhesive [Tape] Rash   Dulera [Mometasone  Furo-Formoterol Fum] Rash   Review of Systems  Constitutional: Negative for fever and chills.  HENT: Positive for postnasal drip.   Respiratory: Positive for cough.   Neurological: Positive for headaches.       Objective:   Physical Exam  CONSTITUTIONAL: Well developed/well nourished HEAD: Normocephalic/atraumatic EYES: EOMI/PERRL ENMT: Mucous membranes moist, oropharynx is clear and moist NECK: supple no meningeal signs SPINE: entire spine nontender CV: S1/S2 noted, no /rubs/gallops noted, 2/6 systolic murmur at the left sternal border, normal rate and rhythm LUNGS: Lungs are clear to auscultation bilaterally, no apparent distress, increased AP diameter with associated decreased breath sounds in the bases and wheezes in both lungs symmetrically ABDOMEN: soft, nontender, no rebound or guarding GU:no cva tenderness NEURO: Pt is awake/alert, moves all extremitiesx4 EXTREMITIES: pulses normal, full ROM SKIN: warm and dry, color normal PSYCH: no abnormalities of mood noted [facility] reading (PRIMARY) by  Dr.Daub no air-fluid levels seen chest x-ray shows severe end-stage COPD without pneumonic infiltrate     BP 144/80   Pulse 113   Temp(Src) 98.1 F (36.7 C) (Oral)   Resp 22   Ht 5\' 1"  (1.549 m)   Wt 107 lb 12.8 oz (48.898 kg)   BMI 20.38 kg/m2   SpO2 95%  Assessment & Plan:  12:01 PM- Will order a chest x-ray. Discussed treatment plan with patient at bedside and patient verbalized agreement. I have refilled her Provera inhaler. She will be on Augmentin 875 twice a day for one week. She's also take probiotics twice a day. She will be on prednisone 20 mg a day for 4 days then 10 mg a day. I personally took the history and performed the physical examination and the documentation was provided by my scribe.

## 2013-01-09 NOTE — Patient Instructions (Signed)
Take the Augmentin twice a day for 7 days. Use the nasal spray as instructed. He will be on prednisone for 8 days. I have refilled your Pro air inhaler

## 2013-01-13 ENCOUNTER — Encounter: Payer: Self-pay | Admitting: Internal Medicine

## 2013-01-13 ENCOUNTER — Ambulatory Visit (INDEPENDENT_AMBULATORY_CARE_PROVIDER_SITE_OTHER): Payer: Medicare Other | Admitting: Internal Medicine

## 2013-01-13 VITALS — BP 120/62 | HR 120 | Temp 97.9°F | Ht 61.0 in | Wt 106.0 lb

## 2013-01-13 DIAGNOSIS — G4734 Idiopathic sleep related nonobstructive alveolar hypoventilation: Secondary | ICD-10-CM

## 2013-01-13 DIAGNOSIS — R0902 Hypoxemia: Secondary | ICD-10-CM

## 2013-01-13 DIAGNOSIS — J449 Chronic obstructive pulmonary disease, unspecified: Secondary | ICD-10-CM

## 2013-01-13 MED ORDER — FLUTTER DEVI: Status: DC

## 2013-01-13 MED ORDER — PREDNISONE (PAK) 10 MG PO TABS: ORAL_TABLET | ORAL | Status: DC

## 2013-01-13 MED ORDER — PANTOPRAZOLE SODIUM 40 MG PO TBEC: 40.0000 mg | DELAYED_RELEASE_TABLET | Freq: Every day | ORAL | Status: DC

## 2013-01-13 MED ORDER — FAMOTIDINE 20 MG PO TABS: ORAL_TABLET | ORAL | Status: DC

## 2013-01-13 NOTE — Progress Notes (Signed)
Subjective:    Patient ID: Judith Boyer, female    DOB: [birth year] MRN: [id number]     Brief patient profile:  75 yowf  Quit smoking 01/10/13  with onset of sob x around early 2000s on daily meds since then and much worse since fall 2014 referred 12/30/2012 to pulmonary clinic by Dr Cleta Alberts for copd eval  History of Present Illness  12/30/2012 1st [facility] office visit/ Wert cc doe x 10 years much worse x sev months assoc with purulent sputum not better on amox now ond day #  4/10 d for sinus infection and on pred now. On max dose advair and already took neb and combivent on day of ov and "no better" with nasal congestion as well. rec Stop advair Start dulera 200 Take 2 puffs first thing in am and then another 2 puffs about 12 hours later.  Continue duoneb 4 x daily and the [place] Combivent is only if needed The key is to stop smoking completely before smoking completely stops you!    01/13/2013 f/u ov/Wert re: copd gold IV/ prefers advair/ 02 concentrator for hs use only Chief Complaint  Patient presents with  . COPD    Breathing has gotten worse since last OV. Reports SOB, coughing with production of mucus, chest tightness and wheezing.  mucus green to yellow> on augmentin per primary care  Prednisone 10 mg weaned to one on 12/4 then back to 2 daily starting 01/09/13 Tends to sleep ok and wakes up on 02 feeling fine but then starts noting breathing worse p walks to bathroom and back and doesn't get around to using am meds for at least 30 min Feels like choking on mucus   No obvious pattern in day to day or daytime variabilty or assoc  cp or chest tightness, subjective wheeze overt sinus or hb symptoms. No unusual exp hx or h/o childhood pna/ asthma or knowledge of premature birth.  Sleeping ok without nocturnal  or early am exacerbation  of respiratory  c/o's or need for noct saba. Also denies any obvious fluctuation of symptoms with weather or environmental changes or  other aggravating or alleviating factors except as outlined above   Current Medications, Allergies, Complete Past Medical History, Past Surgical History, Family History, and Social History were reviewed in [employer] record.  ROS  The following are not active complaints unless bolded sore throat, dysphagia, dental problems, itching, sneezing,  nasal congestion or excess/ purulent secretions, ear ache,   fever, chills, sweats, unintended wt loss, pleuritic or exertional cp, hemoptysis,  orthopnea pnd or leg swelling, presyncope, palpitations, heartburn, abdominal pain, anorexia, nausea, vomiting, diarrhea  or change in bowel or urinary habits, change in stools or urine, dysuria,hematuria,  rash, arthralgias, visual complaints, headache, numbness weakness or ataxia or problems with walking or coordination,  change in mood/affect or memory.                         Objective:   Physical Exam  amb wf nad   01/13/2013        106  Wt Readings from Last 3 Encounters:  12/30/12 108 lb 6.4 oz (49.17 kg)  11/13/12 109 lb (49.442 kg)  10/18/12 111 lb (50.349 kg)      HEENT mild turbinate edema.  Oropharynx no thrush or excess pnd or cobblestoning.  No JVD or cervical adenopathy. Mild accessory muscle hypertrophy. Trachea midline, nl thryroid. Chest was hyperinflated by percussion with  diminished breath sounds and moderate increased exp time without wheeze. Hoover sign positive at mid inspiration. Regular rate and rhythm without murmur gallop or rub or increase P2 or edema.  Abd: no hsm, nl excursion. Ext warm without cyanosis or clubbing.      CXR  12/30/2012 : COPD without evidence of acute cardiopulmonary disease     Assessment & Plan:

## 2013-01-13 NOTE — Patient Instructions (Addendum)
Please see patient coordinator before you leave today  to schedule delivery of humdified 02   For cough > mucinex dm up to 1200 mg every 12 hours and use flutter valve as much as you can  Prednisone 10 mg take  4 each am x 2 days,   2 each am x 2 days,  1 each am x 2 days and stop   Pantoprazole (protonix) 40 mg   Take 30-60 min before first meal of the day and Pepcid 20 mg one bedtime until return to office - this is the best way to tell whether stomach acid is contributing to your problem.    GERD (REFLUX)  is an extremely common cause of respiratory symptoms, many times with no significant heartburn at all.    It can be treated with medication, but also with lifestyle changes including avoidance of late meals, excessive alcohol, smoking cessation, and avoid fatty foods, chocolate, peppermint, colas, red wine, and acidic juices such as orange juice.  NO MINT OR MENTHOL PRODUCTS SO NO COUGH DROPS  USE SUGARLESS CANDY INSTEAD (jolley ranchers or Stover's)  NO OIL BASED VITAMINS - use powdered substitutes.   Please schedule a follow up office visit in 2 weeks, sooner if needed  With all medications and inhalers in hand

## 2013-01-14 NOTE — Assessment & Plan Note (Addendum)
-   12/30/2012  Walked RA x 3 laps @ 185 ft each stopped due to legs shaky, sats 94% - 01/13/2013  Walked RA x 3 laps @ 185 ft each stopped due to  End of study, sats 93%   DDX of  difficult airways managment all start with A and  include Adherence, Ace Inhibitors, Acid Reflux, Active Sinus Disease, Alpha 1 Antitripsin deficiency, Anxiety masquerading as Airways dz,  ABPA,  allergy(esp in young), Aspiration (esp in elderly), Adverse effects of DPI,  Active smokers, plus two Bs  = Bronchiectasis and Beta blocker use..and one C= CHF  Adherence is always the initial "prime suspect" and is a multilayered concern that requires a "trust but verify" approach in every patient - starting with knowing how to use medications, especially inhalers, correctly, keeping up with refills and understanding the fundamental difference between maintenance and prns vs those medications only taken for a very short course and then stopped and not refilled.  - is not on a regimen that addresses her worse symptoms which occur after walking to br (advair will never help here, duoneb might if she used it right away, reviewed.  Will consider change to laba/ics neb on f/u and change to lama  ? Acid (or non-acid) GERD > always difficult to exclude as up to 75% of pts in some series report no assoc GI/ Heartburn symptoms> rec max (24h)  acid suppression and diet restrictions/ reviewed and instructions given in writting  ? Active sinus dz > sinus ct p augmentin if not better  ? Anxiety > usually dx of exclusion   Will also add flutter to help mobilize mucus

## 2013-01-27 ENCOUNTER — Ambulatory Visit (INDEPENDENT_AMBULATORY_CARE_PROVIDER_SITE_OTHER): Payer: Medicare Other | Admitting: Internal Medicine

## 2013-01-27 ENCOUNTER — Encounter: Payer: Self-pay | Admitting: Internal Medicine

## 2013-01-27 VITALS — BP 140/60 | HR 110 | Temp 97.6°F | Ht 61.0 in | Wt 104.6 lb

## 2013-01-27 DIAGNOSIS — J4489 Other specified chronic obstructive pulmonary disease: Secondary | ICD-10-CM

## 2013-01-27 DIAGNOSIS — J449 Chronic obstructive pulmonary disease, unspecified: Secondary | ICD-10-CM

## 2013-01-27 MED ORDER — ALBUTEROL SULFATE (2.5 MG/3ML) 0.083% IN NEBU: 2.5000 mg | INHALATION_SOLUTION | RESPIRATORY_TRACT | Status: DC | PRN

## 2013-01-27 MED ORDER — ALBUTEROL SULFATE HFA 108 (90 BASE) MCG/ACT IN AERS: 2.0000 | INHALATION_SPRAY | RESPIRATORY_TRACT | Status: DC | PRN

## 2013-01-27 MED ORDER — ACLIDINIUM BROMIDE 400 MCG/ACT IN AEPB: 1.0000 | INHALATION_SPRAY | Freq: Two times a day (BID) | RESPIRATORY_TRACT | Status: DC

## 2013-01-27 MED ORDER — FLUTICASONE-SALMETEROL 500-50 MCG/DOSE IN AEPB: INHALATION_SPRAY | RESPIRATORY_TRACT | Status: DC

## 2013-01-27 MED ORDER — PREDNISONE 10 MG PO TABS: ORAL_TABLET | ORAL | Status: DC

## 2013-01-27 NOTE — Patient Instructions (Addendum)
Plan A = Automatic Tudorza one twice daily immediately after advair  Ok to use advair 500 / 50 one twice daily   Plan B = Back up = Only use your albuterol (proair) as a rescue medication to be used if you can't catch your breath by resting or doing a relaxed purse lip breathing pattern.  - The less you use it, the better it will work when you need it. - Ok to use up to every 4 hours if you must but call for immediate appointment if use goes up over your usual need - Don't leave home without it !!  (think of it like your spare tire for your car)   Plan C = backs up plan B = Only use your albuterol nebulizer up to every 4 hours if proair doesn't work  Prednisone 10 mg take  4 each am x 2 days,   2 each am x 2 days,  1 each am x 2 days and stop

## 2013-01-27 NOTE — Progress Notes (Signed)
Subjective:    Patient ID: Judith Boyer, female    DOB: February 16, 1945 MRN: [id number]     Brief patient profile:  20 yowf  Quit smoking 01/10/13  with onset of sob x around early 2000s on daily meds since then and much worse since fall 2014 referred 12/30/2012 to pulmonary clinic by Dr Cleta Alberts for copd eval  History of Present Illness  12/30/2012 1st [facility] office visit/ Judith Boyer cc doe x 10 years much worse x sev months assoc with purulent sputum not better on amox now ond day #  4/10 d for sinus infection and on pred now. On max dose advair and already took neb and combivent on day of ov and "no better" with nasal congestion as well. rec Stop advair Start dulera 200 Take 2 puffs first thing in am and then another 2 puffs about 12 hours later.  Continue duoneb 4 x daily and the [place] Combivent is only if needed The key is to stop smoking completely before smoking completely stops you!    01/13/2013 f/u ov/Judith Boyer re: copd gold IV/ prefers advair/ 02 concentrator for hs use only Chief Complaint  Patient presents with  . COPD    Breathing has gotten worse since last OV. Reports SOB, coughing with production of mucus, chest tightness and wheezing.  mucus green to yellow> on augmentin per primary care  Prednisone 10 mg weaned to one on 12/4 then back to 2 daily starting 01/09/13 Tends to sleep ok and wakes up on 02 feeling fine but then starts noting breathing worse p walks to bathroom and back and doesn't get around to using am meds for at least 30 min Feels like choking on mucus  rec Please see patient coordinator before you leave today  to schedule delivery of humdified 02  For cough > mucinex dm up to 1200 mg every 12 hours and use flutter valve as much as you can Prednisone 10 mg take  4 each am x 2 days,   2 each am x 2 days,  1 each am x 2 days and stop  Pantoprazole (protonix) 40 mg   Take 30-60 min before first meal of the day and Pepcid 20 mg one bedtime until return to  office - this is the best way to tell whether stomach acid is contributing to your problem.   GERD diet    01/27/2013 f/u ov/Judith Boyer re: copd GOLD IV/ noct 02 dep  Chief Complaint  Patient presents with  . Follow-up    Pt reports breathing is about the same- has good days and bad days.  overall better on advair 500/50 vs dulera so switched back    No obvious pattern in day to day or daytime variabilty or assoc cough cp or chest tightness, subjective wheeze overt sinus or hb symptoms. No unusual exp hx or h/o childhood pna/ asthma or knowledge of premature birth.  Sleeping ok without nocturnal  or early am exacerbation  of respiratory  c/o's or need for noct saba. Also denies any obvious fluctuation of symptoms with weather or environmental changes or other aggravating or alleviating factors except as outlined above   Current Medications, Allergies, Complete Past Medical History, Past Surgical History, Family History, and Social History were reviewed in [employer] record.  ROS  The following are not active complaints unless bolded sore throat, dysphagia, dental problems, itching, sneezing,  nasal congestion or excess/ purulent secretions, ear ache,   fever, chills, sweats, unintended wt loss, pleuritic  or exertional cp, hemoptysis,  orthopnea pnd or leg swelling, presyncope, palpitations, heartburn, abdominal pain, anorexia, nausea, vomiting, diarrhea  or change in bowel or urinary habits, change in stools or urine, dysuria,hematuria,  rash, arthralgias, visual complaints, headache, numbness weakness or ataxia or problems with walking or coordination,  change in mood/affect or memory.                         Objective:   Physical Exam  amb wf nad   01/13/2013        106  > 01/27/2013   105  Wt Readings from Last 3 Encounters:  12/30/12 108 lb 6.4 oz (49.17 kg)  11/13/12 109 lb (49.442 kg)  10/18/12 111 lb (50.349 kg)      HEENT mild turbinate edema.   Oropharynx no thrush or excess pnd or cobblestoning.  No JVD or cervical adenopathy. Mild accessory muscle hypertrophy. Trachea midline, nl thryroid. Chest was hyperinflated by percussion with diminished breath sounds and moderate increased exp time without wheeze. Hoover sign positive at mid inspiration. Regular rate and rhythm without murmur gallop or rub or increase P2 or edema.  Abd: no hsm, nl excursion. Ext warm without cyanosis or clubbing.      CXR  12/30/2012 : COPD without evidence of acute cardiopulmonary disease     Assessment & Plan:

## 2013-01-29 ENCOUNTER — Encounter: Payer: Self-pay | Admitting: Internal Medicine

## 2013-01-29 NOTE — Assessment & Plan Note (Signed)
-   hfa 90% p coaching 12/30/2012  - 12/30/2012  Walked RA x 3 laps @ 185 ft each stopped due to legs shaky, sats 94% - 01/13/2013  Walked RA x 3 laps @ 185 ft each stopped due to  End of study, sats 93%   No better with dulera in terms of breathing with concerns re dry throat and upper airways "congestion" s excess mucus typical of effects of advair 500 but refuses to consider a lower dose  Nothing much else to offer here in terms of bronchodilators x a more effective anticholinergic regimen so try adding turdorza  The proper method of use, as well as anticipated side effects, of a metered-dose inhaler are discussed and demonstrated to the patient. Improved effectiveness after extensive coaching during this visit to a level of approximately  90%    Each maintenance medication was reviewed in detail including most importantly the difference between maintenance and as needed and under what circumstances the prns are to be used.  Please see instructions for details which were reviewed in writing and the patient given a copy.

## 2013-02-10 ENCOUNTER — Ambulatory Visit (INDEPENDENT_AMBULATORY_CARE_PROVIDER_SITE_OTHER): Payer: Medicare Other | Admitting: Internal Medicine

## 2013-02-10 ENCOUNTER — Encounter: Payer: Self-pay | Admitting: Internal Medicine

## 2013-02-10 VITALS — BP 124/60 | HR 114 | Temp 98.3°F | Ht 61.0 in | Wt 105.0 lb

## 2013-02-10 DIAGNOSIS — I10 Essential (primary) hypertension: Secondary | ICD-10-CM

## 2013-02-10 DIAGNOSIS — R0902 Hypoxemia: Secondary | ICD-10-CM

## 2013-02-10 DIAGNOSIS — G4734 Idiopathic sleep related nonobstructive alveolar hypoventilation: Secondary | ICD-10-CM

## 2013-02-10 DIAGNOSIS — J449 Chronic obstructive pulmonary disease, unspecified: Secondary | ICD-10-CM

## 2013-02-10 MED ORDER — TIOTROPIUM BROMIDE MONOHYDRATE 18 MCG IN CAPS: ORAL_CAPSULE | RESPIRATORY_TRACT | Status: DC

## 2013-02-10 MED ORDER — VALSARTAN 80 MG PO TABS: 80.0000 mg | ORAL_TABLET | Freq: Every day | ORAL | Status: DC

## 2013-02-10 NOTE — Patient Instructions (Addendum)
Stop tudorza and [employer] spiriva one capsule each am  Start diovan (valsartan) 80 mg daily in place of cozar  For cough congestion use mucinex up to 1200 mg every 12 hours and as much of the flutter valve as you can  Please schedule a follow up office visit in 4 weeks, sooner if needed

## 2013-02-10 NOTE — Progress Notes (Signed)
Subjective:    Patient ID: Judith Boyer, female    DOB: Dec 11, 1945 MRN: [id number]     Brief patient profile:  70 yowf  Quit smoking 01/10/13  with onset of sob x around early 2000s on daily meds since then and much worse since fall 2014 referred 12/30/2012 to pulmonary clinic by Dr Everlene Farrier for copd eval  History of Present Illness  12/30/2012 1st [facility] office visit/ Wert cc doe x 10 years much worse x sev months assoc with purulent sputum not better on amox now ond day #  4/10 d for sinus infection and on pred now. On max dose advair and already took neb and combivent on day of ov and "no better" with nasal congestion as well. rec Stop advair Start dulera 200 Take 2 puffs first thing in am and then another 2 puffs about 12 hours later.  Continue duoneb 4 x daily and the [place] is only if needed The key is to stop smoking completely before smoking completely stops you!    01/13/2013 f/u ov/Wert re: copd gold IV/ prefers advair/ 02 concentrator for hs use only Chief Complaint  Patient presents with  . COPD    Breathing has gotten worse since last OV. Reports SOB, coughing with production of mucus, chest tightness and wheezing.  mucus green to yellow> on augmentin per primary care  Prednisone 10 mg weaned to one on 12/4 then back to 2 daily starting 01/09/13 Tends to sleep ok and wakes up on 02 feeling fine but then starts noting breathing worse p walks to bathroom and back and doesn't get around to using am meds for at least 30 min Feels like choking on mucus  rec Please see patient coordinator before you leave today  to schedule delivery of humdified 02  For cough > mucinex dm up to 1200 mg every 12 hours and use flutter valve as much as you can Prednisone 10 mg take  4 each am x 2 days,   2 each am x 2 days,  1 each am x 2 days and stop  Pantoprazole (protonix) 40 mg   Take 30-60 min before first meal of the day and Pepcid 20 mg one bedtime until return to  office - this is the best way to tell whether stomach acid is contributing to your problem.   GERD diet    01/27/2013 f/u ov/Wert re: copd GOLD IV/ noct 02 dep  Chief Complaint  Patient presents with  . Follow-up    Pt reports breathing is about the same- has good days and bad days.  overall better on advair 500/50 vs dulera so switched back  rec Plan A = Automatic Tudorza one twice daily immediately after advair  Ok to use advair 500 / 50 one twice daily  Plan B = Back up = Only use your albuterol (proair)  Plan C = backs up plan B = Only use your albuterol nebulizer up to every 4 hours if proair doesn't work Prednisone 10 mg take  4 each am x 2 days,   2 each am x 2 days,  1 each am x 2 days and stop  02/10/2013 f/u ov/Wert re: GOLD IV COPD/ noct 02 dep Chief Complaint  Patient presents with  . Follow-up    Pt c/o increased SOB for the past 2 wks. She states using albuterol inhaler and neb 3-4 times per day. Sometimes wakes up at night and has to use rescue medications.  Can still taste cigs in her sputum. Not using mucinex dm "you told me to stop it"/ otherwise appears adherent Not able to afford tudorza Doe x > room to room slow walking    No obvious pattern in day to day or daytime variabilty or assoc  cp or chest tightness, subjective wheeze overt sinus or hb symptoms. No unusual exp hx or h/o childhood pna/ asthma or knowledge of premature birth.  Sleeping ok without nocturnal  or early am exacerbation  of respiratory  c/o's or need for noct saba. Also denies any obvious fluctuation of symptoms with weather or environmental changes or other aggravating or alleviating factors except as outlined above   Current Medications, Allergies, Complete Past Medical History, Past Surgical History, Family History, and Social History were reviewed in [employer] record.  ROS  The following are not active complaints unless bolded sore throat, dysphagia, dental  problems, itching, sneezing,  nasal congestion or excess/ purulent secretions, ear ache,   fever, chills, sweats, unintended wt loss, pleuritic or exertional cp, hemoptysis,  orthopnea pnd or leg swelling, presyncope, palpitations, heartburn, abdominal pain, anorexia, nausea, vomiting, diarrhea  or change in bowel or urinary habits, change in stools or urine, dysuria,hematuria,  rash, arthralgias, visual complaints, headache, numbness weakness or ataxia or problems with walking or coordination,  change in mood/affect or memory.                         Objective:   Physical Exam  amb wf nad   01/13/2013        106  > 01/27/2013   105 > 02/10/2013 105  Wt Readings from Last 3 Encounters:  12/30/12 108 lb 6.4 oz (49.17 kg)  11/13/12 109 lb (49.442 kg)  10/18/12 111 lb (50.349 kg)      HEENT mild turbinate edema.  Oropharynx no thrush or excess pnd or cobblestoning.  No JVD or cervical adenopathy. Mild accessory muscle hypertrophy. Trachea midline, nl thryroid. Chest was hyperinflated by percussion with diminished breath sounds and moderate increased exp time without wheeze. Hoover sign positive at mid inspiration. Regular rate and rhythm without murmur gallop or rub or increase P2 or edema.  Abd: no hsm, nl excursion. Ext warm without cyanosis or clubbing.      CXR  12/30/2012 : COPD without evidence of acute cardiopulmonary disease     Assessment & Plan:

## 2013-02-12 NOTE — Assessment & Plan Note (Signed)
For reasons not clear to me, losartan in the generic form has been reported now from mulitple sources, the most recent of which = Journal of Immunology, to cause the same pattern of upper airway symptoms as seen with acei.   This has not been reported with exposure to the other ARB's to date, so it seems reasonable for now to try  r generic diovan 80 mg one daily.

## 2013-02-12 NOTE — Assessment & Plan Note (Addendum)
-   hfa 90% p coaching 12/30/2012  - 12/30/2012  Walked RA x 3 laps @ 185 ft each stopped due to legs shaky, sats 94% - 01/13/2013  Walked RA x 3 laps @ 185 ft each stopped due to  End of study, sats 93%  - added tudorza 01/27/13 and d/c duoneb/ atrovent   DDX of  difficult airways managment all start with A and  include Adherence, Ace Inhibitors, Acid Reflux, Active Sinus Disease, Alpha 1 Antitripsin deficiency, Anxiety masquerading as Airways dz,  ABPA,  allergy(esp in young), Aspiration (esp in elderly), Adverse effects of DPI,  Active smokers, plus two Bs  = Bronchiectasis and Beta blocker use..and one C= CHF  Adherence is always the initial "prime suspect" and is a multilayered concern that requires a "trust but verify" approach in every patient - starting with knowing how to use medications, especially inhalers, correctly, keeping up with refills and understanding the fundamental difference between maintenance and prns vs those medications only taken for a very short course and then stopped and not refilled.  Have attempted to consolidate anticholinergic rx to an effective LAMA but can't afford tudorza > try spiriva  The proper method of use, as well as anticipated side effects, of a dry powdered -dose inhaler are discussed and demonstrated to the patient. Improved effectiveness after extensive coaching during this visit to a level of approximately  90%   ? Adverse effect of meds > cozar has been identified as a potential problem along so far amongst the arbs > try change to valsartan (see hbp)

## 2013-02-20 ENCOUNTER — Emergency Department (HOSPITAL_COMMUNITY): Payer: Medicare Other

## 2013-02-20 ENCOUNTER — Encounter (HOSPITAL_COMMUNITY): Payer: Self-pay | Admitting: Emergency Medicine

## 2013-02-20 ENCOUNTER — Inpatient Hospital Stay (HOSPITAL_COMMUNITY)
Admission: EM | Admit: 2013-02-20 | Discharge: 2013-02-26 | DRG: 193 | Disposition: A | Discharge status: Home / Self Care | Payer: Medicare Other | Attending: Family Medicine | Admitting: Family Medicine

## 2013-02-20 DIAGNOSIS — E872 Acidosis, unspecified: Secondary | ICD-10-CM | POA: Diagnosis present

## 2013-02-20 DIAGNOSIS — R0989 Other specified symptoms and signs involving the circulatory and respiratory systems: Secondary | ICD-10-CM

## 2013-02-20 DIAGNOSIS — I252 Old myocardial infarction: Secondary | ICD-10-CM

## 2013-02-20 DIAGNOSIS — D649 Anemia, unspecified: Secondary | ICD-10-CM | POA: Diagnosis present

## 2013-02-20 DIAGNOSIS — M7989 Other specified soft tissue disorders: Secondary | ICD-10-CM

## 2013-02-20 DIAGNOSIS — J449 Chronic obstructive pulmonary disease, unspecified: Secondary | ICD-10-CM

## 2013-02-20 DIAGNOSIS — I10 Essential (primary) hypertension: Secondary | ICD-10-CM | POA: Diagnosis present

## 2013-02-20 DIAGNOSIS — R079 Chest pain, unspecified: Secondary | ICD-10-CM

## 2013-02-20 DIAGNOSIS — I2510 Atherosclerotic heart disease of native coronary artery without angina pectoris: Secondary | ICD-10-CM | POA: Diagnosis present

## 2013-02-20 DIAGNOSIS — E46 Unspecified protein-calorie malnutrition: Secondary | ICD-10-CM | POA: Diagnosis present

## 2013-02-20 DIAGNOSIS — F411 Generalized anxiety disorder: Secondary | ICD-10-CM

## 2013-02-20 DIAGNOSIS — R42 Dizziness and giddiness: Secondary | ICD-10-CM

## 2013-02-20 DIAGNOSIS — E119 Type 2 diabetes mellitus without complications: Secondary | ICD-10-CM | POA: Diagnosis present

## 2013-02-20 DIAGNOSIS — J4489 Other specified chronic obstructive pulmonary disease: Secondary | ICD-10-CM | POA: Diagnosis present

## 2013-02-20 DIAGNOSIS — Z79899 Other long term (current) drug therapy: Secondary | ICD-10-CM

## 2013-02-20 DIAGNOSIS — R531 Weakness: Secondary | ICD-10-CM

## 2013-02-20 DIAGNOSIS — Z7902 Long term (current) use of antithrombotics/antiplatelets: Secondary | ICD-10-CM

## 2013-02-20 DIAGNOSIS — J441 Chronic obstructive pulmonary disease with (acute) exacerbation: Secondary | ICD-10-CM | POA: Diagnosis present

## 2013-02-20 DIAGNOSIS — Z7982 Long term (current) use of aspirin: Secondary | ICD-10-CM

## 2013-02-20 DIAGNOSIS — F17200 Nicotine dependence, unspecified, uncomplicated: Secondary | ICD-10-CM | POA: Diagnosis present

## 2013-02-20 DIAGNOSIS — Z515 Encounter for palliative care: Secondary | ICD-10-CM

## 2013-02-20 DIAGNOSIS — J9620 Acute and chronic respiratory failure, unspecified whether with hypoxia or hypercapnia: Secondary | ICD-10-CM | POA: Diagnosis present

## 2013-02-20 DIAGNOSIS — J189 Pneumonia, unspecified organism: Principal | ICD-10-CM | POA: Diagnosis present

## 2013-02-20 LAB — BASIC METABOLIC PANEL
BUN: 10 mg/dL (ref 6–23)
CALCIUM: 8.8 mg/dL (ref 8.4–10.5)
CO2: 28 mEq/L (ref 19–32)
Chloride: 97 mEq/L (ref 96–112)
Creatinine, Ser: 0.4 mg/dL — ABNORMAL LOW (ref 0.50–1.10)
GLUCOSE: 221 mg/dL — AB (ref 70–99)
POTASSIUM: 4.3 meq/L (ref 3.7–5.3)
Sodium: 138 mEq/L (ref 137–147)

## 2013-02-20 LAB — BLOOD GAS, ARTERIAL
ACID-BASE EXCESS: 3.5 mmol/L — AB (ref 0.0–2.0)
BICARBONATE: 29.6 meq/L — AB (ref 20.0–24.0)
Drawn by: 252031
O2 Content: 4 L/min
O2 SAT: 99.7 %
PCO2 ART: 63.4 mmHg — AB (ref 35.0–45.0)
PO2 ART: 141 mmHg — AB (ref 80.0–100.0)
Patient temperature: 98.6
TCO2: 31.5 mmol/L (ref 0–100)
pH, Arterial: 7.291 — ABNORMAL LOW (ref 7.350–7.450)

## 2013-02-20 LAB — CBC WITH DIFFERENTIAL/PLATELET
BASOS ABS: 0 10*3/uL (ref 0.0–0.1)
BASOS PCT: 0 % (ref 0–1)
EOS ABS: 0.1 10*3/uL (ref 0.0–0.7)
EOS PCT: 1 % (ref 0–5)
HCT: 29 % — ABNORMAL LOW (ref 36.0–46.0)
Hemoglobin: 8.9 g/dL — ABNORMAL LOW (ref 12.0–15.0)
Lymphocytes Relative: 30 % (ref 12–46)
Lymphs Abs: 2.3 10*3/uL (ref 0.7–4.0)
MCH: 26.5 pg (ref 26.0–34.0)
MCHC: 30.7 g/dL (ref 30.0–36.0)
MCV: 86.3 fL (ref 78.0–100.0)
Monocytes Absolute: 0.6 10*3/uL (ref 0.1–1.0)
Monocytes Relative: 8 % (ref 3–12)
Neutro Abs: 4.6 10*3/uL (ref 1.7–7.7)
Neutrophils Relative %: 60 % (ref 43–77)
Platelets: 500 10*3/uL — ABNORMAL HIGH (ref 150–400)
RBC: 3.36 MIL/uL — ABNORMAL LOW (ref 3.87–5.11)
RDW: 13.9 % (ref 11.5–15.5)
WBC: 7.6 10*3/uL (ref 4.0–10.5)

## 2013-02-20 LAB — PRO B NATRIURETIC PEPTIDE: PRO B NATRI PEPTIDE: 25.3 pg/mL (ref 0–125)

## 2013-02-20 LAB — MRSA PCR SCREENING: MRSA by PCR: NEGATIVE

## 2013-02-20 LAB — STREP PNEUMONIAE URINARY ANTIGEN: STREP PNEUMO URINARY ANTIGEN: NEGATIVE

## 2013-02-20 LAB — POCT I-STAT TROPONIN I: Troponin i, poc: 0.01 ng/mL (ref 0.00–0.08)

## 2013-02-20 LAB — GLUCOSE, CAPILLARY: Glucose-Capillary: 198 mg/dL — ABNORMAL HIGH (ref 70–99)

## 2013-02-20 MED ORDER — ENOXAPARIN SODIUM 40 MG/0.4ML ~~LOC~~ SOLN
40.0000 mg | SUBCUTANEOUS | Status: DC
  Administered 2013-02-20 – 2013-02-21 (×2): 40 mg via SUBCUTANEOUS
  Filled 2013-02-20 (×3): qty 0.4

## 2013-02-20 MED ORDER — PANTOPRAZOLE SODIUM 40 MG PO TBEC
40.0000 mg | DELAYED_RELEASE_TABLET | Freq: Every day | ORAL | Status: DC
  Administered 2013-02-21 – 2013-02-26 (×6): 40 mg via ORAL
  Filled 2013-02-20 (×6): qty 1

## 2013-02-20 MED ORDER — ALBUTEROL SULFATE (2.5 MG/3ML) 0.083% IN NEBU
5.0000 mg | INHALATION_SOLUTION | Freq: Once | RESPIRATORY_TRACT | Status: AC
  Administered 2013-02-20: 5 mg via RESPIRATORY_TRACT

## 2013-02-20 MED ORDER — MOMETASONE FURO-FORMOTEROL FUM 200-5 MCG/ACT IN AERO: 2.0000 | INHALATION_SPRAY | Freq: Two times a day (BID) | RESPIRATORY_TRACT | Status: DC

## 2013-02-20 MED ORDER — FAMOTIDINE 20 MG PO TABS
20.0000 mg | ORAL_TABLET | Freq: Every day | ORAL | Status: DC
Start: 2013-02-20 — End: 2013-02-26
  Administered 2013-02-20 – 2013-02-25 (×6): 20 mg via ORAL
  Filled 2013-02-20 (×10): qty 1

## 2013-02-20 MED ORDER — INSULIN ASPART 100 UNIT/ML ~~LOC~~ SOLN
0.0000 [IU] | Freq: Three times a day (TID) | SUBCUTANEOUS | Status: DC
  Administered 2013-02-21: 2 [IU] via SUBCUTANEOUS
  Administered 2013-02-21: 3 [IU] via SUBCUTANEOUS
  Administered 2013-02-21: 5 [IU] via SUBCUTANEOUS
  Administered 2013-02-22: 15 [IU] via SUBCUTANEOUS
  Administered 2013-02-22: 3 [IU] via SUBCUTANEOUS
  Administered 2013-02-22: 8 [IU] via SUBCUTANEOUS
  Administered 2013-02-23: 5 [IU] via SUBCUTANEOUS
  Administered 2013-02-23: 8 [IU] via SUBCUTANEOUS
  Administered 2013-02-23 – 2013-02-24 (×3): 11 [IU] via SUBCUTANEOUS
  Administered 2013-02-24 – 2013-02-25 (×2): 3 [IU] via SUBCUTANEOUS
  Administered 2013-02-25: 11 [IU] via SUBCUTANEOUS
  Administered 2013-02-25: 3 [IU] via SUBCUTANEOUS
  Administered 2013-02-26 (×2): 2 [IU] via SUBCUTANEOUS

## 2013-02-20 MED ORDER — PIPERACILLIN-TAZOBACTAM 3.375 G IVPB
3.3750 g | Freq: Three times a day (TID) | INTRAVENOUS | Status: DC
  Administered 2013-02-21 – 2013-02-24 (×9): 3.375 g via INTRAVENOUS
  Filled 2013-02-20 (×13): qty 50

## 2013-02-20 MED ORDER — VANCOMYCIN HCL 10 G IV SOLR
1250.0000 mg | Freq: Once | INTRAVENOUS | Status: AC
  Administered 2013-02-20: 1250 mg via INTRAVENOUS
  Filled 2013-02-20: qty 1250

## 2013-02-20 MED ORDER — ALBUTEROL SULFATE (2.5 MG/3ML) 0.083% IN NEBU
INHALATION_SOLUTION | RESPIRATORY_TRACT | Status: AC
  Filled 2013-02-20: qty 6

## 2013-02-20 MED ORDER — CLOPIDOGREL BISULFATE 75 MG PO TABS
75.0000 mg | ORAL_TABLET | Freq: Every morning | ORAL | Status: DC
  Administered 2013-02-21 – 2013-02-22 (×2): 75 mg via ORAL
  Filled 2013-02-20 (×3): qty 1

## 2013-02-20 MED ORDER — SIMVASTATIN 40 MG PO TABS
40.0000 mg | ORAL_TABLET | Freq: Every day | ORAL | Status: DC
  Administered 2013-02-20 – 2013-02-25 (×6): 40 mg via ORAL
  Filled 2013-02-20 (×8): qty 1

## 2013-02-20 MED ORDER — MONTELUKAST SODIUM 10 MG PO TABS
10.0000 mg | ORAL_TABLET | Freq: Every day | ORAL | Status: DC
  Administered 2013-02-20 – 2013-02-23 (×4): 10 mg via ORAL
  Filled 2013-02-20 (×6): qty 1

## 2013-02-20 MED ORDER — IPRATROPIUM BROMIDE 0.02 % IN SOLN
0.5000 mg | Freq: Once | RESPIRATORY_TRACT | Status: AC
  Administered 2013-02-20: 0.5 mg via RESPIRATORY_TRACT

## 2013-02-20 MED ORDER — PIPERACILLIN-TAZOBACTAM 3.375 G IVPB 30 MIN
3.3750 g | Freq: Once | INTRAVENOUS | Status: AC
  Administered 2013-02-20: 3.375 g via INTRAVENOUS
  Filled 2013-02-20: qty 50

## 2013-02-20 MED ORDER — IPRATROPIUM BROMIDE 0.02 % IN SOLN
RESPIRATORY_TRACT | Status: AC
  Filled 2013-02-20: qty 2.5

## 2013-02-20 MED ORDER — IRBESARTAN 75 MG PO TABS
75.0000 mg | ORAL_TABLET | Freq: Every day | ORAL | Status: DC
  Administered 2013-02-21 – 2013-02-26 (×6): 75 mg via ORAL
  Filled 2013-02-20 (×7): qty 1

## 2013-02-20 MED ORDER — FLUTICASONE-SALMETEROL 500-50 MCG/DOSE IN AEPB
1.0000 | INHALATION_SPRAY | Freq: Two times a day (BID) | RESPIRATORY_TRACT | Status: DC
  Filled 2013-02-20: qty 14

## 2013-02-20 MED ORDER — IPRATROPIUM BROMIDE 0.02 % IN SOLN
0.5000 mg | Freq: Four times a day (QID) | RESPIRATORY_TRACT | Status: DC
  Administered 2013-02-20 – 2013-02-23 (×10): 0.5 mg via RESPIRATORY_TRACT
  Filled 2013-02-20 (×10): qty 2.5

## 2013-02-20 MED ORDER — INSULIN ASPART 100 UNIT/ML ~~LOC~~ SOLN
0.0000 [IU] | Freq: Every day | SUBCUTANEOUS | Status: DC
  Administered 2013-02-21: 3 [IU] via SUBCUTANEOUS
  Administered 2013-02-22: 2 [IU] via SUBCUTANEOUS

## 2013-02-20 MED ORDER — SODIUM CHLORIDE 0.9 % IV SOLN
INTRAVENOUS | Status: AC
  Administered 2013-02-20: 23:00:00 via INTRAVENOUS

## 2013-02-20 MED ORDER — BUDESONIDE-FORMOTEROL FUMARATE 160-4.5 MCG/ACT IN AERO
2.0000 | INHALATION_SPRAY | Freq: Two times a day (BID) | RESPIRATORY_TRACT | Status: DC
  Administered 2013-02-21 – 2013-02-24 (×7): 2 via RESPIRATORY_TRACT
  Filled 2013-02-20: qty 6

## 2013-02-20 MED ORDER — IOHEXOL 350 MG/ML SOLN
80.0000 mL | Freq: Once | INTRAVENOUS | Status: AC | PRN
  Administered 2013-02-20: 80 mL via INTRAVENOUS

## 2013-02-20 MED ORDER — LORAZEPAM 2 MG/ML IJ SOLN
0.2500 mg | Freq: Once | INTRAMUSCULAR | Status: AC
  Administered 2013-02-20: 0.25 mg via INTRAVENOUS
  Filled 2013-02-20: qty 1

## 2013-02-20 MED ORDER — BUDESONIDE-FORMOTEROL FUMARATE 160-4.5 MCG/ACT IN AERO
2.0000 | INHALATION_SPRAY | Freq: Two times a day (BID) | RESPIRATORY_TRACT | Status: DC
  Filled 2013-02-20: qty 6

## 2013-02-20 MED ORDER — VANCOMYCIN HCL IN DEXTROSE 1-5 GM/200ML-% IV SOLN
1000.0000 mg | INTRAVENOUS | Status: DC
  Administered 2013-02-21 – 2013-02-23 (×3): 1000 mg via INTRAVENOUS
  Filled 2013-02-20 (×5): qty 200

## 2013-02-20 MED ORDER — DM-GUAIFENESIN ER 30-600 MG PO TB12
1.0000 | ORAL_TABLET | Freq: Two times a day (BID) | ORAL | Status: DC | PRN
  Filled 2013-02-20: qty 1

## 2013-02-20 MED ORDER — LEVALBUTEROL HCL 0.63 MG/3ML IN NEBU
0.6300 mg | INHALATION_SOLUTION | RESPIRATORY_TRACT | Status: DC | PRN
  Administered 2013-02-21 – 2013-02-23 (×8): 0.63 mg via RESPIRATORY_TRACT
  Filled 2013-02-20 (×7): qty 3

## 2013-02-20 MED ORDER — ASPIRIN EC 325 MG PO TBEC
325.0000 mg | DELAYED_RELEASE_TABLET | ORAL | Status: DC
  Administered 2013-02-21: 325 mg via ORAL
  Filled 2013-02-20: qty 1

## 2013-02-20 NOTE — Progress Notes (Signed)
Pt transferred from ED with RN, pt ax4, and able to walk to bathroom. Pt did become very short of breath while ambulating. VVS at this time. RT aware of order for ABG. Will continue to monitor.

## 2013-02-20 NOTE — ED Provider Notes (Signed)
CSN: [id number]     Arrival date & time 02/20/13  1512 History   First MD Initiated Contact with Patient 02/20/13 1520     Chief Complaint  Patient presents with  . Shortness of Breath  . Chest Pain   (Consider location/radiation/quality/duration/timing/severity/associated sxs/prior Treatment) HPI Comments: Patient is a 68 year old female with a past medical history of COPD, hypertension, diabetes, and CAD who presents with a 3 day history of chest pain and SOB. Symptoms started gradually and progressively worsened since the onset. Patient reports severe chest pressure located in her central chest and does not radiate. Patient reports associated SOB and leg swelling. Patient has not tried anything for symptoms. No aggravating/alleviating factors. No other associated symptoms. Patient denies any exogenous estrogen, recent travel/surgery/immobilization or previous DVT/PE. Patient does not wear oxygen at home. She continues to smoke. She used to see Dr. Doylene Canard for Cardiology but stopped seeing him due to "everything looked ok." Patient sees Dr. Melvyn Novas for pulmonology.    Past Medical History  Diagnosis Date  . Myocardial infarction   . COPD (chronic obstructive pulmonary disease)   . Hypertension   . Coronary artery disease   . Shortness of breath   . Diabetes mellitus without complication     type 2   Past Surgical History  Procedure Laterality Date  . Abdominal hysterectomy    . Shoulder surgery    . Coronary stent placement     Family History  Problem Relation Age of Onset  . Heart disease Maternal Grandmother   . Brain cancer Brother   . Lung cancer Mother     smoked   History  Substance Use Topics  . Smoking status: Current Every Day Smoker -- 0.50 packs/day for 50 years    Types: Cigarettes    Last Attempt to Quit: 01/10/2013  . Smokeless tobacco: Never Used  . Alcohol Use: No   OB History   Grav Para Term Preterm Abortions TAB SAB Ect Mult Living                  Review of Systems  Constitutional: Negative for fever, chills and fatigue.  HENT: Negative for trouble swallowing.   Eyes: Negative for visual disturbance.  Respiratory: Positive for shortness of breath.   Cardiovascular: Positive for chest pain. Negative for palpitations.  Gastrointestinal: Negative for nausea, vomiting, abdominal pain and diarrhea.  Genitourinary: Negative for dysuria and difficulty urinating.  Musculoskeletal: Negative for arthralgias and neck pain.  Skin: Negative for color change.  Neurological: Negative for dizziness and weakness.  Psychiatric/Behavioral: Negative for dysphoric mood.    Allergies  Doxycycline; Codeine; Lisinopril; Adhesive; and Dulera  Home Medications   Current Outpatient Rx  Name  Route  Sig  Dispense  Refill  . albuterol (PROAIR HFA) 108 (90 BASE) MCG/ACT inhaler   Inhalation   Inhale 2 puffs into the lungs every 4 (four) hours as needed for wheezing or shortness of breath.   1 Inhaler   11   . albuterol (PROVENTIL) (2.5 MG/3ML) 0.083% nebulizer solution   Nebulization   Take 3 mLs (2.5 mg total) by nebulization every 4 (four) hours as needed for wheezing.   150 mL   5   . aspirin 325 MG EC tablet   Oral   Take 325 mg by mouth every morning.         . clopidogrel (PLAVIX) 75 MG tablet   Oral   Take 1 tablet (75 mg total) by mouth every morning.  30 tablet   6   . dextromethorphan-guaiFENesin (MUCINEX DM) 30-600 MG per 12 hr tablet   Oral   Take 1 tablet by mouth every 12 (twelve) hours as needed. For cough         . famotidine (PEPCID) 20 MG tablet      One at bedtime   30 tablet   2   . Fluticasone-Salmeterol (ADVAIR) 500-50 MCG/DOSE AEPB   Inhalation   Inhale 1 puff into the lungs 2 (two) times daily.         . metFORMIN (GLUCOPHAGE) 1000 MG tablet      Take 1 tablet in the morning and 1 tablet in the evening   60 tablet   6   . montelukast (SINGULAIR) 10 MG tablet   Oral   Take 1 tablet (10 mg  total) by mouth at bedtime.   30 tablet   6   . nitroGLYCERIN (NITROSTAT) 0.4 MG SL tablet   Sublingual   Place 1 tablet (0.4 mg total) under the tongue every 5 (five) minutes x 3 doses as needed for chest pain.   25 tablet   1   . pantoprazole (PROTONIX) 40 MG tablet   Oral   Take 1 tablet (40 mg total) by mouth daily. Take 30-60 min before first meal of the day   30 tablet   2   . pravastatin (PRAVACHOL) 80 MG tablet   Oral   Take 1 tablet (80 mg total) by mouth every evening.   30 tablet   11   . Respiratory Therapy Supplies (FLUTTER) DEVI      Use as directed   1 each   0   . tiotropium (SPIRIVA HANDIHALER) 18 MCG inhalation capsule      One capsule each am   30 capsule   12   . valsartan (DIOVAN) 80 MG tablet   Oral   Take 1 tablet (80 mg total) by mouth daily.   30 tablet   11    BP 134/58  Pulse 124  Temp(Src) 98.1 F (36.7 C)  Resp 24  Wt 106 lb (48.081 kg)  SpO2 99% Physical Exam  Nursing note and vitals reviewed. Constitutional: She is oriented to person, place, and time. She appears well-developed and well-nourished.  Patient is rapidly breathing and appears uncomfortable.   HENT:  Head: Normocephalic and atraumatic.  Eyes: Conjunctivae are normal.  Neck: Normal range of motion.  Cardiovascular: Normal rate and regular rhythm.  Exam reveals no gallop and no friction rub.   No murmur heard. Pulmonary/Chest: She has no wheezes. She has no rales. She exhibits no tenderness.  Patient has increased respiratory effort. Lung sounds difficult to auscultate.   Abdominal: Soft. She exhibits no distension. There is no tenderness. There is no rebound and no guarding.  Musculoskeletal: Normal range of motion.  Neurological: She is alert and oriented to person, place, and time. Coordination normal.  Speech is goal-oriented. Moves limbs without ataxia.   Skin: Skin is warm and dry.  Psychiatric: She has a normal mood and affect. Her behavior is normal.     ED Course  Procedures (including critical care time) Labs Review Labs Reviewed  CBC WITH DIFFERENTIAL - Abnormal; Notable for the following:    RBC 3.36 (*)    Hemoglobin 8.9 (*)    HCT 29.0 (*)    Platelets 500 (*)    All other components within normal limits  BASIC METABOLIC PANEL - Abnormal; Notable for the  following:    Glucose, Bld 221 (*)    Creatinine, Ser 0.40 (*)    All other components within normal limits  GLUCOSE, CAPILLARY - Abnormal; Notable for the following:    Glucose-Capillary 198 (*)    All other components within normal limits  BLOOD GAS, ARTERIAL - Abnormal; Notable for the following:    pH, Arterial 7.291 (*)    pCO2 arterial 63.4 (*)    pO2, Arterial 141.0 (*)    Bicarbonate 29.6 (*)    Acid-Base Excess 3.5 (*)    All other components within normal limits  MRSA PCR SCREENING  CULTURE, EXPECTORATED SPUTUM-ASSESSMENT  GRAM STAIN  PRO B NATRIURETIC PEPTIDE  STREP PNEUMONIAE URINARY ANTIGEN  TROPONIN I  HIV ANTIBODY (ROUTINE TESTING)  LEGIONELLA ANTIGEN, URINE  COMPREHENSIVE METABOLIC PANEL  HEMOGLOBIN A1C  TROPONIN I  TROPONIN I  VITAMIN B12  FOLATE  IRON AND TIBC  FERRITIN  RETICULOCYTES  POCT I-STAT TROPONIN I   Imaging Review Dg Chest 2 View  02/20/2013   CLINICAL DATA:  68 year old female with shortness of breath. Hypertension diabetes. Initial encounter.  EXAM: CHEST  2 VIEW  COMPARISON:  01/09/2013 and earlier.  FINDINGS: Chronic large lung volumes are stable. Stable coarse pulmonary markings and attenuated upper lobe vascular markings. No pneumothorax or pulmonary edema. No pleural effusion or acute pulmonary opacity. Stable cardiac size and mediastinal contours. Visualized tracheal air column is within normal limits. No acute osseous abnormality identified.  IMPRESSION: No acute cardiopulmonary abnormality.   Electronically Signed   By: Lars Pinks M.D.   On: 02/20/2013 15:56   Ct Angio Chest Pe W/cm &/or Wo Cm  02/20/2013   CLINICAL  DATA:  Shortness of breath.  History of COPD.  EXAM: CT ANGIOGRAPHY CHEST WITH CONTRAST  TECHNIQUE: Multidetector CT imaging of the chest was performed using the standard protocol during bolus administration of intravenous contrast. Multiplanar CT image reconstructions including MIPs were obtained to evaluate the vascular anatomy.  CONTRAST:  41mL OMNIPAQUE IOHEXOL 350 MG/ML SOLN  COMPARISON:  Chest CT 02/05/2012.  FINDINGS: Mediastinum: Heart size is normal. There is no significant pericardial fluid, thickening or pericardial calcification. There is atherosclerosis of the thoracic aorta, the great vessels of the mediastinum and the coronary arteries, including calcified atherosclerotic plaque in the left main, left anterior descending, left circumflex and right coronary arteries. Multiple borderline enlarged mediastinal lymph nodes are noted, but are nonspecific, largest of which measures 9 mm in short axis in the subcarinal station. Esophagus is unremarkable in appearance.  Lungs/Pleura: Small focus of ground-glass attenuation with some peripheral bronchiolectasis and some focal atelectasis in the lateral segment of the right middle lobe, favored to reflect an area of active infection/inflammation. This is new compared to the prior study 02/05/2012, best demonstrated on image 107 of series 6. In addition, there are some foci of peribronchovascular ground-glass attenuation micronodularity in the left lower lobe best demonstrated on image 87 of series 6, also likely infectious or inflammatory. Mild diffuse bronchial wall thickening. Moderate centrilobular emphysema, most pronounced throughout the lung apices bilaterally.  Upper Abdomen: Unremarkable.  Musculoskeletal: There are no aggressive appearing lytic or blastic lesions noted in the visualized portions of the skeleton.  Review of the MIP images confirms the above findings.  IMPRESSION: 1. No evidence of pulmonary embolism. 2. Small focus of ground-glass  attenuation airspace disease in the lateral segment of the right middle lobe, and foci of ground-glass attenuation micronodularity in a peribronchovascular distribution in the left lower lobe,  presumably infectious or inflammatory. Follow-up noncontrast chest CT in 3 months is recommended to ensure resolution of these findings, as the lesion in the lateral segment of the right middle lobe could alternatively (less likely) represent a low-grade neoplasm such is in adenocarcinoma. This recommendation follows the consensus statement: Recommendations for the Management of Subsolid Pulmonary Nodules Detected at CT: A Statement from the Fleischner Society as published in Radiology 2013; 266:304-317. 3. Atherosclerosis, including left main and 3 vessel coronary artery disease. Please note that although the presence of coronary artery calcium documents the presence of coronary artery disease, the severity of this disease and any potential stenosis cannot be assessed on this non-gated CT examination. Assessment for potential risk factor modification, dietary therapy or pharmacologic therapy may be warranted, if clinically indicated. 4. Mild diffuse bronchial wall thickening with moderate centrilobular emphysema; imaging findings suggestive of underlying COPD.   Electronically Signed   By: Vinnie Langton M.D.   On: 02/20/2013 18:39    EKG Interpretation   None       MDM   1. Community acquired pneumonia   2. Anemia   3. CAD in native artery   4. COPD exacerbation     3:47 PM Labs, chest xray and troponin pending. EKG shows tachycardia and right axis deviation.   Patient likely has pneumonia that is exacerbating her COPD. Patient will be started on Vanc and Zosyn and admitted.   Alvina Chou, PA-C 02/21/13 [phone]

## 2013-02-20 NOTE — ED Notes (Signed)
Pt is on home o2  For night and now she needs it all day  Feet are swelling feels like pressure on chest

## 2013-02-20 NOTE — H&P (Signed)
PCP:  Jenny Reichmann, MD [facility] [place]   Chief Complaint:  Shortness of breath  HPI: Judith Boyer is a 68 y.o. female   has a past medical history of Myocardial infarction; COPD (chronic obstructive pulmonary disease); Hypertension; Coronary artery disease; Shortness of breath; and Diabetes mellitus without complication.   Presented with  4-5 of worsening shortness of breath. Last night shortness of breath was so sever she was worried. Denies chest pain but reports increased leg swelling bilaterally. Denies any fever. Denis sick contacts. Denies any cough. Patient is a smoker but states trying to quit. She is usually on night time oxygen at home but had to use it around the clock lately. CTA of the chest done in ER showed no PE but bilateral PNA radiologist recommended repeat to rule out underlying malignancy. Hospitalist called for admission. Of note patient does state she has been losing weight and has been chronically anemic  Review of Systems:    Pertinent positives include:shortness of breath at rest.   dyspnea on exertion, wheezing worse than baseline.  Bilateral lower extremity swelling  fatigue, weight loss   Constitutional:  No weight loss, night sweats, Fevers, chills, HEENT:  No headaches, Difficulty swallowing,Tooth/dental problems,Sore throat,  No sneezing, itching, ear ache, nasal congestion, post nasal drip,  Cardio-vascular:  No chest pain, Orthopnea, PND, anasarca, dizziness, palpitations.no  GI:  No heartburn, indigestion, abdominal pain, nausea, vomiting, diarrhea, change in bowel habits, loss of appetite, melena, blood in stool, hematemesis Resp:  no  No excess mucus, no productive cough, No non-productive cough, No coughing up of blood.No change in color of mucus.No wheezing. Skin:  no rash or lesions. No jaundice GU:  no dysuria, change in color of urine, no urgency or frequency. No straining to urinate.  No flank pain.   Musculoskeletal:  No joint pain or no joint swelling. No decreased range of motion. No back pain.  Psych:  No change in mood or affect. No depression or anxiety. No memory loss.  Neuro: no localizing neurological complaints, no tingling, no weakness, no double vision, no gait abnormality, no slurred speech, no confusion  Otherwise ROS are negative except for above, 10 systems were reviewed  Past Medical History: Past Medical History  Diagnosis Date  . Myocardial infarction   . COPD (chronic obstructive pulmonary disease)   . Hypertension   . Coronary artery disease   . Shortness of breath   . Diabetes mellitus without complication     type 2   Past Surgical History  Procedure Laterality Date  . Abdominal hysterectomy    . Shoulder surgery    . Coronary stent placement       Medications: Prior to Admission medications   Medication Sig Start Date End Date Taking? Authorizing Provider  albuterol (PROAIR HFA) 108 (90 BASE) MCG/ACT inhaler Inhale 2 puffs into the lungs every 4 (four) hours as needed for wheezing or shortness of breath. 01/27/13  Yes Tanda Rockers, MD  albuterol (PROVENTIL) (2.5 MG/3ML) 0.083% nebulizer solution Take 3 mLs (2.5 mg total) by nebulization every 4 (four) hours as needed for wheezing. 01/27/13  Yes Tanda Rockers, MD  aspirin 325 MG EC tablet Take 325 mg by mouth every other day.    Yes Historical Provider, MD  clopidogrel (PLAVIX) 75 MG tablet Take 1 tablet (75 mg total) by mouth every morning. 10/18/12  Yes [name], MD  dextromethorphan-guaiFENesin [facility] DM) 30-600 MG per 12 hr tablet Take 1  tablet by mouth every 12 (twelve) hours as needed. For cough   Yes Historical Provider, MD  famotidine (PEPCID) 20 MG tablet One at bedtime 01/13/13  Yes Tanda Rockers, MD  Fluticasone-Salmeterol (ADVAIR) 500-50 MCG/DOSE AEPB Inhale 1 puff into the lungs 2 (two) times daily.   Yes Historical Provider, MD  ipratropium-albuterol (DUONEB) 0.5-2.5 (3) MG/3ML  SOLN Take 3 mLs by nebulization 3 (three) times daily. Take 1 vial at 9:00AM, 3:00pm and 10:00pm   Yes Historical Provider, MD  metFORMIN (GLUCOPHAGE) 1000 MG tablet Take 1,000 mg by mouth 2 (two) times daily. Take 1 tablet in the morning and 1 tablet in the evening 10/18/12  Yes [name], MD  montelukast (SINGULAIR) 10 MG tablet Take 1 tablet (10 mg total) by mouth at bedtime. 10/18/12  Yes [name], MD  nitroGLYCERIN (NITROSTAT) 0.4 MG SL tablet Place 1 tablet (0.4 mg total) under the tongue every 5 (five) minutes x 3 doses as needed for chest pain. 02/07/12  Yes Birdie Riddle, MD  pantoprazole (PROTONIX) 40 MG tablet Take 1 tablet (40 mg total) by mouth daily. Take 30-60 min before first meal of the day 01/13/13  Yes Tanda Rockers, MD  pravastatin (PRAVACHOL) 80 MG tablet Take 1 tablet (80 mg total) by mouth every evening. 02/29/12  Yes [name], MD  valsartan (DIOVAN) 80 MG tablet Take 1 tablet (80 mg total) by mouth daily. 02/10/13  Yes Tanda Rockers, MD  Respiratory Therapy Supplies (FLUTTER) DEVI Use as directed 01/13/13   Tanda Rockers, MD    Allergies:   Allergies  Allergen Reactions  . Doxycycline Anaphylaxis  . Codeine Other (See Comments)    hyperactivity  . Lisinopril Cough  . Adhesive [Tape] Rash  . Dulera [Mometasone Furo-Formoterol Fum] Rash    Social History:  Ambulatory    independently  Lives at   Home alone   reports that she has been smoking Cigarettes.  She has a 25 pack-year smoking history. She has never used smokeless tobacco. She reports that she does not drink alcohol or use illicit drugs.   Family History: family history includes Brain cancer in her brother; Heart disease in her maternal grandmother; Lung cancer in her mother.    Physical Exam: Patient Vitals for the past 24 hrs:  BP Temp Pulse Resp SpO2 Weight  02/20/13 1930 128/65 mmHg - 118 24 100 % -  02/20/13 1900 136/60 mmHg - 116 22 99 % -  02/20/13 1845 138/59 mmHg - 111 13 100 % -   02/20/13 1838 - - - - 97 % -  02/20/13 1830 130/60 mmHg - 110 25 100 % -  02/20/13 1815 181/154 mmHg - 120 37 99 % -  02/20/13 1800 142/60 mmHg - 109 28 100 % -  02/20/13 1745 139/51 mmHg - 108 29 99 % -  02/20/13 1715 138/77 mmHg - 111 21 99 % -  02/20/13 1700 143/55 mmHg - 113 26 99 % -  02/20/13 1645 133/53 mmHg - 113 15 99 % -  02/20/13 1630 128/109 mmHg - 113 29 100 % -  02/20/13 1534 - - - - 99 % -  02/20/13 1533 134/58 mmHg - - 24 95 % -  02/20/13 1520 - - - - - 48.081 kg (106 lb)  02/20/13 1515 165/50 mmHg 98.1 F (36.7 C) 124 28 96 % -    1. General:  in No Acute distress 2. Psychological: Slightly somnolent but Oriented  3. Head/ENT:   Moist  Mucous Membranes                          Head Non traumatic, neck supple                          Normal  Dentition 4. SKIN: normal  Skin turgor,  Skin clean Dry and intact no rash 5. Heart: rapid Regular rate and rhythm no Murmur, Rub or gallop 6. Lungs:  No wheezes poor air movement, occasional crackles   7. Abdomen: Soft, non-tender, Non distended 8. Lower extremities: no clubbing, cyanosis, 1+ pedal  edema 9. Neurologically Grossly intact, moving all 4 extremities equally 10. MSK: Normal range of motion  body mass index is 20.04 kg/(m^2).   Labs on Admission:   Recent Labs  02/20/13 1618  NA 138  K 4.3  CL 97  CO2 28  GLUCOSE 221*  BUN 10  CREATININE 0.40*  CALCIUM 8.8   No results found for this basename: AST, ALT, ALKPHOS, BILITOT, PROT, ALBUMIN,  in the last 72 hours No results found for this basename: LIPASE, AMYLASE,  in the last 72 hours  Recent Labs  02/20/13 1618  WBC 7.6  NEUTROABS 4.6  HGB 8.9*  HCT 29.0*  MCV 86.3  PLT 500*   No results found for this basename: CKTOTAL, CKMB, CKMBINDEX, TROPONINI,  in the last 72 hours No results found for this basename: TSH, T4TOTAL, FREET3, T3FREE, THYROIDAB,  in the last 72 hours No results found for this basename: VITAMINB12, FOLATE, FERRITIN, TIBC,  IRON, RETICCTPCT,  in the last 72 hours Lab Results  Component Value Date   HGBA1C 7.7 12/26/2012    The CrCl is unknown because both a height and weight (above a minimum accepted value) are required for this calculation. ABG    Component Value Date/Time   PHART 7.423 02/05/2012 1500   HCO3 26.8* 02/05/2012 1500   TCO2 28.1 02/05/2012 1500   ACIDBASEDEF 1.0 02/04/2011 1735   O2SAT 96.0 02/05/2012 1500     No results found for this basename: DDIMER     Other results:  I have pearsonaly reviewed this: ECG REPORT  Rate: 121  Rhythm: ST ST&T Change: no ischemic changes   Cultures: No results found for this basename: sdes, specrequest, cult, reptstatus       Radiological Exams on Admission: Dg Chest 2 View  02/20/2013   CLINICAL DATA:  68 year old female with shortness of breath. Hypertension diabetes. Initial encounter.  EXAM: CHEST  2 VIEW  COMPARISON:  01/09/2013 and earlier.  FINDINGS: Chronic large lung volumes are stable. Stable coarse pulmonary markings and attenuated upper lobe vascular markings. No pneumothorax or pulmonary edema. No pleural effusion or acute pulmonary opacity. Stable cardiac size and mediastinal contours. Visualized tracheal air column is within normal limits. No acute osseous abnormality identified.  IMPRESSION: No acute cardiopulmonary abnormality.   Electronically Signed   By: Lars Pinks M.D.   On: 02/20/2013 15:56   Ct Angio Chest Pe W/cm &/or Wo Cm  02/20/2013   CLINICAL DATA:  Shortness of breath.  History of COPD.  EXAM: CT ANGIOGRAPHY CHEST WITH CONTRAST  TECHNIQUE: Multidetector CT imaging of the chest was performed using the standard protocol during bolus administration of intravenous contrast. Multiplanar CT image reconstructions including MIPs were obtained to evaluate the vascular anatomy.  CONTRAST:  4mL OMNIPAQUE IOHEXOL 350 MG/ML SOLN  COMPARISON:  Chest CT 02/05/2012.  FINDINGS: Mediastinum: Heart size is normal. There is no significant  pericardial fluid, thickening or pericardial calcification. There is atherosclerosis of the thoracic aorta, the great vessels of the mediastinum and the coronary arteries, including calcified atherosclerotic plaque in the left main, left anterior descending, left circumflex and right coronary arteries. Multiple borderline enlarged mediastinal lymph nodes are noted, but are nonspecific, largest of which measures 9 mm in short axis in the subcarinal station. Esophagus is unremarkable in appearance.  Lungs/Pleura: Small focus of ground-glass attenuation with some peripheral bronchiolectasis and some focal atelectasis in the lateral segment of the right middle lobe, favored to reflect an area of active infection/inflammation. This is new compared to the prior study 02/05/2012, best demonstrated on image 107 of series 6. In addition, there are some foci of peribronchovascular ground-glass attenuation micronodularity in the left lower lobe best demonstrated on image 87 of series 6, also likely infectious or inflammatory. Mild diffuse bronchial wall thickening. Moderate centrilobular emphysema, most pronounced throughout the lung apices bilaterally.  Upper Abdomen: Unremarkable.  Musculoskeletal: There are no aggressive appearing lytic or blastic lesions noted in the visualized portions of the skeleton.  Review of the MIP images confirms the above findings.  IMPRESSION: 1. No evidence of pulmonary embolism. 2. Small focus of ground-glass attenuation airspace disease in the lateral segment of the right middle lobe, and foci of ground-glass attenuation micronodularity in a peribronchovascular distribution in the left lower lobe, presumably infectious or inflammatory. Follow-up noncontrast chest CT in 3 months is recommended to ensure resolution of these findings, as the lesion in the lateral segment of the right middle lobe could alternatively (less likely) represent a low-grade neoplasm such is in adenocarcinoma. This  recommendation follows the consensus statement: Recommendations for the Management of Subsolid Pulmonary Nodules Detected at CT: A Statement from the [employer] as published in Radiology 2013; 266:304-317. 3. Atherosclerosis, including left main and 3 vessel coronary artery disease. Please note that although the presence of coronary artery calcium documents the presence of coronary artery disease, the severity of this disease and any potential stenosis cannot be assessed on this non-gated CT examination. Assessment for potential risk factor modification, dietary therapy or pharmacologic therapy may be warranted, if clinically indicated. 4. Mild diffuse bronchial wall thickening with moderate centrilobular emphysema; imaging findings suggestive of underlying COPD.   Electronically Signed   By: Vinnie Langton M.D.   On: 02/20/2013 18:39    Chart has been reviewed  Assessment/Plan  68 year old female history of coronary disease, COPD presents with shortness of breath was found to have community acquired pneumonia with a contribution of COPD.  Present on Admission:  . CAP (community acquired pneumonia) - given somewhat increased work of breathing we'll admit to step down, patient states that she feels sleepy it could be due to Ativan she received an emergency department but will obtain ABG to rule out hypercapnia. Broadly cover with Zosyn and think given history of pulmonary disease  . CAD in native artery -cycle cardiac enzymes obtain echogram to evaluate for motion abnormality, no future would recommend repeating CT scan to evaluate for underlying malignancy given history of weight loss  . COPD exacerbation - continue nebulizer treatments hold off on steroids as patient has likely underlining infection  . Hypertension - continue home medications  . Anemia - patient states she has history of anemia with iron deficiency. We'll obtain anemia panel and Hemoccult stool Weight loss - patient will  need to have outpatient workup done  would also obtain colonoscopy as an outpatient since she has missed hers  Prophylaxis: Lovenox, Protonix  CODE STATUS: FULL CODE  Other plan as per orders.  I have spent a total of 60 min on this admission additional time taken to the complexity of admission  Kemari Mares 02/20/2013, 8:12 PM

## 2013-02-20 NOTE — ED Notes (Signed)
PA at bedside.

## 2013-02-20 NOTE — Progress Notes (Signed)
ANTIBIOTIC CONSULT NOTE - INITIAL  Pharmacy Consult for vancomycin and zosyn Indication: rule out pneumonia  Allergies  Allergen Reactions  . Doxycycline Anaphylaxis  . Codeine Other (See Comments)    hyperactivity  . Lisinopril Cough  . Adhesive [Tape] Rash  . [facility] [Mometasone Furo-Formoterol Fum] Rash    Patient Measurements: Height: 5\' 1"  (154.9 cm) Weight: 106 lb 4.2 oz (48.2 kg) IBW/kg (Calculated) : 47.8  Vital Signs: Temp: 97.5 F (36.4 C) (01/16 2115) Temp src: Oral (01/16 2115) BP: 127/54 mmHg (01/16 2115) Pulse Rate: 115 (01/16 2115) Intake/Output from previous day:   Intake/Output from this shift:    Labs:  Recent Labs  02/20/13 1618  WBC 7.6  HGB 8.9*  PLT 500*  CREATININE 0.40*   Estimated Creatinine Clearance: 50.8 ml/min (by C-G formula based on Cr of 0.4). No results found for this basename: VANCOTROUGH, VANCOPEAK, VANCORANDOM, GENTTROUGH, GENTPEAK, GENTRANDOM, TOBRATROUGH, TOBRAPEAK, TOBRARND, AMIKACINPEAK, AMIKACINTROU, AMIKACIN,  in the last 72 hours   Microbiology: No results found for this or any previous visit (from the past 720 hour(s)).  Medical History: Past Medical History  Diagnosis Date  . Myocardial infarction   . COPD (chronic obstructive pulmonary disease)   . Hypertension   . Coronary artery disease   . Shortness of breath   . Diabetes mellitus without complication     type 2    Assessment: 68 year old female presents to [facility] with worsening shortness of breath. CTA suggests bilateral pneumonia. Currently afebrile with normal wbc count at 7, renal function also normal. Vancomycin and zosyn started in the ED, will continue.  Goal of Therapy:  Vancomycin trough level 15-20 mcg/ml  Plan:  Measure antibiotic drug levels at steady state Follow up culture results Vancomycin 1g IV q 24 hours Zosyn 3.375g IV q8 hours - infuse each dose over 4 hours   Georgina Peer 02/20/2013,10:07 PM

## 2013-02-21 LAB — COMPREHENSIVE METABOLIC PANEL
ALT: 15 U/L (ref 0–35)
AST: 17 U/L (ref 0–37)
Albumin: 3.2 g/dL — ABNORMAL LOW (ref 3.5–5.2)
Alkaline Phosphatase: 84 U/L (ref 39–117)
BUN: 7 mg/dL (ref 6–23)
CO2: 30 meq/L (ref 19–32)
CREATININE: 0.44 mg/dL — AB (ref 0.50–1.10)
Calcium: 8.4 mg/dL (ref 8.4–10.5)
Chloride: 100 mEq/L (ref 96–112)
Glucose, Bld: 174 mg/dL — ABNORMAL HIGH (ref 70–99)
Potassium: 4.3 mEq/L (ref 3.7–5.3)
Sodium: 140 mEq/L (ref 137–147)
Total Protein: 5.9 g/dL — ABNORMAL LOW (ref 6.0–8.3)

## 2013-02-21 LAB — TROPONIN I
Troponin I: <0.3 ng/mL (ref <0.30)
Troponin I: <0.3 ng/mL (ref <0.30)

## 2013-02-21 LAB — GLUCOSE, CAPILLARY
GLUCOSE-CAPILLARY: 166 mg/dL — AB (ref 70–99)
GLUCOSE-CAPILLARY: 127 mg/dL — AB (ref 70–99)
GLUCOSE-CAPILLARY: 223 mg/dL — AB (ref 70–99)
GLUCOSE-CAPILLARY: 263 mg/dL — AB (ref 70–99)

## 2013-02-21 LAB — CBC
HCT: 26.5 % — ABNORMAL LOW (ref 36.0–46.0)
Hemoglobin: 8.1 g/dL — ABNORMAL LOW (ref 12.0–15.0)
MCH: 26.6 pg (ref 26.0–34.0)
MCHC: 30.6 g/dL (ref 30.0–36.0)
MCV: 86.9 fL (ref 78.0–100.0)
Platelets: 437 10*3/uL — ABNORMAL HIGH (ref 150–400)
RBC: 3.05 MIL/uL — ABNORMAL LOW (ref 3.87–5.11)
RDW: 13.8 % (ref 11.5–15.5)
WBC: 6.6 10*3/uL (ref 4.0–10.5)

## 2013-02-21 LAB — FOLATE

## 2013-02-21 LAB — HEMOGLOBIN A1C
HEMOGLOBIN A1C: 9.1 % — AB (ref <5.7)
MEAN PLASMA GLUCOSE: 214 mg/dL — AB (ref <117)

## 2013-02-21 LAB — FERRITIN: Ferritin: 9 ng/mL — ABNORMAL LOW (ref 10–291)

## 2013-02-21 LAB — IRON AND TIBC
IRON: 13 ug/dL — AB (ref 42–135)
SATURATION RATIOS: 3 % — AB (ref 20–55)
TIBC: 402 ug/dL (ref 250–470)
UIBC: 389 ug/dL (ref 125–400)

## 2013-02-21 LAB — RETICULOCYTES
RBC.: 3.29 MIL/uL — ABNORMAL LOW (ref 3.87–5.11)
Retic Count, Absolute: 88.8 10*3/uL (ref 19.0–186.0)
Retic Ct Pct: 2.7 % (ref 0.4–3.1)

## 2013-02-21 LAB — VITAMIN B12: VITAMIN B 12: 179 pg/mL — AB (ref 211–911)

## 2013-02-21 LAB — HIV ANTIBODY (ROUTINE TESTING W REFLEX): HIV: NONREACTIVE

## 2013-02-21 MED ORDER — ACETAMINOPHEN 325 MG PO TABS
650.0000 mg | ORAL_TABLET | Freq: Four times a day (QID) | ORAL | Status: DC | PRN
  Administered 2013-02-21 – 2013-02-25 (×6): 650 mg via ORAL
  Filled 2013-02-21 (×6): qty 2

## 2013-02-21 MED ORDER — GLUCERNA SHAKE PO LIQD
237.0000 mL | Freq: Two times a day (BID) | ORAL | Status: DC
  Administered 2013-02-22 – 2013-02-25 (×4): 237 mL via ORAL
  Filled 2013-02-21 (×2): qty 237

## 2013-02-21 MED ORDER — PREDNISONE 50 MG PO TABS
50.0000 mg | ORAL_TABLET | Freq: Every day | ORAL | Status: AC
  Administered 2013-02-21 – 2013-02-25 (×5): 50 mg via ORAL
  Filled 2013-02-21 (×6): qty 1

## 2013-02-21 NOTE — Progress Notes (Signed)
Noted that this patient's PCP is Dr. Everlene Farrier with [facility].  Spoke to [facility] Medicine Resident, Dr. Ardelia Mems and they are willing take over care effective now. Apprised patient's nurse.  Candance Bohlman 02/21/2013 11:17 AM

## 2013-02-21 NOTE — Progress Notes (Signed)
  Echocardiogram 2D Echocardiogram has been performed.  [name] 02/21/2013, 12:44 PM

## 2013-02-21 NOTE — ED Provider Notes (Signed)
History/physical exam/procedure(s) were performed by non-physician practitioner and as supervising physician I was immediately available for consultation/collaboration. I have reviewed all notes and am in agreement with care and plan.   Shaune Pollack, MD 02/21/13 [phone]

## 2013-02-21 NOTE — Progress Notes (Signed)
Family Medicine Teaching Service Daily Progress Note Intern Pager: [phone]  Patient name: Judith Boyer Medical record number: [id number] Date of birth: 03-06-1945 Age: 68 y.o. Gender: female  Primary Care Provider: Jenny Reichmann, MD Consultants: none Code Status: full code  Pt Overview and Major Events to Date:  1/16: admitted under [facility] service 1/17: transferred to [place] and Plan:  68 year old female history of coronary disease, COPD presents with shortness of breath was found to have community acquired pneumonia.   # SOB secondary to CAP vs. COPD exacerbation - CTA without evidence of PE in ER, pro BNP normal - CT did show patchy ground glass but not extremely impressive for large bilateral PNA - ABG yesterday showed respiratory acidosis with pH of 7.29, clinically does not seem confused today - supplemental O2 prn (on O2 QHS 2L at home) - continue vanc/zosyn - urine strep pneumo antigen negative, await legionella antigen - check HIV antibody - pending - continue symbicort, singulair - xopenex nebs prn, mucinex prn for cough - start prednisone 50mg  daily x 5 days  # CAD - trop negative x 3 - await echo - continue simvastatin, plavix, aspirin  # Hypertension  - stable, continue home ARB  # Anemia  - anemia panel pending - Hgb 8.9 yesterday, down several points below baseline - await repeat Hgb today - FOBT  # Weight loss  - recommend outpatient workup, likely needs colonoscopy  # Diabetes: Hgb A1c 9.1 - SSI while hospitalized, hold home metformin  FEN/GI: diabetic diet, SLIV PPx: lovenox  Disposition: pending clinical improvement  Subjective: Feels somewhat better than she did upon admission yesterday. Still SOB especially with movement. Denies any recent blood in her stool.  Objective: Temp:  [97.4 F (36.3 C)-98.1 F (36.7 C)] 97.4 F (36.3 C) (01/17 1202) Pulse Rate:  [106-124] 106 (01/17 0329) Resp:  [13-37] 22 (01/17  0329) BP: (115-181)/(42-154) 143/60 mmHg (01/17 0329) SpO2:  [95 %-100 %] 97 % (01/17 0911) FiO2 (%):  [28 %] 28 % (01/16 2205) Weight:  [106 lb (48.081 kg)-106 lb 4.2 oz (48.2 kg)] 106 lb 4.2 oz (48.2 kg) (01/16 2115) Physical Exam: General: NAD, [place] in place Cardiovascular: RRR but heart sounds distant Respiratory: diminished air movement throughout. No appreciable crackles or rales. Abdomen: soft, nontender to palpation Extremities: No appreciable lower extremity edema bilaterally   Laboratory: CBC  Recent Labs Lab 02/20/13 1618  WBC 7.6  HGB 8.9*  HCT 29.0*  PLT 500*     CMET  Recent Labs Lab 02/20/13 1618 02/21/13 0540  NA 138 140  K 4.3 4.3  CL 97 100  CO2 28 30  BUN 10 7  CREATININE 0.40* 0.44*  GLUCOSE 221* 174*  CALCIUM 8.8 8.4  AST  --  17  ALT  --  15  ALKPHOS  --  84  PROT  --  5.9*  ALBUMIN  --  3.2*      Imaging/Diagnostic Tests:  CTA Chest: 1. No evidence of pulmonary embolism.  2. Small focus of ground-glass attenuation airspace disease in the lateral segment of the right middle lobe, and foci of ground-glass attenuation micronodularity in a peribronchovascular distribution in the left lower lobe, presumably infectious or inflammatory. Follow-up noncontrast chest CT in 3 months is recommended to ensure resolution of these findings, as the lesion in the lateral segment of the right middle lobe could alternatively (less likely) represent a low-grade neoplasm such is in adenocarcinoma. This recommendation follows the consensus statement: Recommendations  for the Management of Subsolid Pulmonary Nodules Detected at CT: A Statement from the [place] as published in Radiology 2013; 266:304-317.  3. Atherosclerosis, including left main and 3 vessel coronary artery disease. Please note that although the presence of coronary artery calcium documents the presence of coronary artery disease, the severity of this disease and any potential stenosis cannot  be assessed on this non-gated CT examination. Assessment for potential risk factor modification, dietary therapy or pharmacologic therapy may be warranted, if clinically indicated.  4. Mild diffuse bronchial wall thickening with moderate centrilobular emphysema; imaging findings suggestive of underlying COPD.  CXR: No acute cardiopulmonary abnormality.   Leeanne Rio, MD 02/21/2013, 12:30 PM PGY-2, [place] Intern pager: [phone], text pages welcome

## 2013-02-21 NOTE — Progress Notes (Signed)
INITIAL NUTRITION ASSESSMENT  DOCUMENTATION CODES Per approved criteria  -Not Applicable   INTERVENTION: - Glucerna shakes BID - Encouraged high protein food choice at every meal to promote increased muscle strength - Encouraged small frequent meals to prevent any further unintended weight loss - Unit RD to continue to monitor   NUTRITION DIAGNOSIS: Inadequate oral intake related to poor appetite as evidenced by pt report.   Goal: Pt to consume >90% of meals/supplements  Monitor:  Weights, labs, intake  Reason for Assessment: Malnutrition screening tool   68 y.o. female  Admitting Dx: Shortness of breath  ASSESSMENT: Pt with history of MI, COPD, HTN, CAD, DM, and shortness of breath. Presented with 4-5 of worsening shortness of breath. Found to have community acquired pneumonia with a contribution of COPD.   Met with pt who reports poor appetite at home. Eats only 1 meal/day and does not drink any nutritional supplements. Reports 9 pound unintended weight loss in the past 2 months. States she has been trying to improve her blood sugar control at home. Reports her son does the grocery shopping for her. Agreeable to trying Glucerna shakes. Pt states she has been trying to work on eating 3 meals/day.    Height: Ht Readings from Last 1 Encounters:  02/20/13 '5\' 1"'  (1.549 m)    Weight: Wt Readings from Last 1 Encounters:  02/20/13 106 lb 4.2 oz (48.2 kg)    Ideal Body Weight: 105 lb  % Ideal Body Weight: 101%  Wt Readings from Last 10 Encounters:  02/20/13 106 lb 4.2 oz (48.2 kg)  02/10/13 105 lb (47.628 kg)  01/27/13 104 lb 9.6 oz (47.446 kg)  01/13/13 106 lb (48.081 kg)  01/09/13 107 lb 12.8 oz (48.898 kg)  12/30/12 108 lb 6.4 oz (49.17 kg)  11/13/12 109 lb (49.442 kg)  10/18/12 111 lb (50.349 kg)  09/12/12 112 lb (50.803 kg)  09/06/12 113 lb 3.2 oz (51.347 kg)    Usual Body Weight: 115 lb 2 months ago per pt  % Usual Body Weight: 92%  BMI:  Body mass index  is 20.09 kg/(m^2).  Estimated Nutritional Needs: Kcal: 1200-1400  Protein: 55-70g Fluid: 1.2-1.4L/day  Skin: +1 RLE, LLE edema  Diet Order: Carb Control  EDUCATION NEEDS: -No education needs identified at this time   Intake/Output Summary (Last 24 hours) at 02/21/13 1521 Last data filed at 02/21/13 0600  Gross per 24 hour  Intake    725 ml  Output    950 ml  Net   -225 ml    Last BM: 1/15  Labs:   Recent Labs Lab 02/20/13 1618 02/21/13 0540  NA 138 140  K 4.3 4.3  CL 97 100  CO2 28 30  BUN 10 7  CREATININE 0.40* 0.44*  CALCIUM 8.8 8.4  GLUCOSE 221* 174*    CBG (last 3)   Recent Labs  02/20/13 2159 02/21/13 0723 02/21/13 1145  GLUCAP 198* 166* 127*    Scheduled Meds: . aspirin  325 mg Oral QODAY  . budesonide-formoterol  2 puff Inhalation BID  . clopidogrel  75 mg Oral q morning - 10a  . enoxaparin (LOVENOX) injection  40 mg Subcutaneous Q24H  . famotidine  20 mg Oral QHS  . insulin aspart  0-15 Units Subcutaneous TID WC  . insulin aspart  0-5 Units Subcutaneous QHS  . ipratropium  0.5 mg Nebulization QID  . irbesartan  75 mg Oral Daily  . montelukast  10 mg Oral QHS  . pantoprazole  40 mg Oral QAC breakfast  . piperacillin-tazobactam (ZOSYN)  IV  3.375 g Intravenous Q8H  . predniSONE  50 mg Oral Q breakfast  . simvastatin  40 mg Oral q1800  . vancomycin  1,000 mg Intravenous Q24H    Continuous Infusions:   Past Medical History  Diagnosis Date  . Myocardial infarction   . COPD (chronic obstructive pulmonary disease)   . Hypertension   . Coronary artery disease   . Shortness of breath   . Diabetes mellitus without complication     type 2    Past Surgical History  Procedure Laterality Date  . Abdominal hysterectomy    . Shoulder surgery    . Coronary stent placement      Mikey College MS, RD, LDN [phone] Weekend/After Hours Pager

## 2013-02-21 NOTE — Progress Notes (Signed)
Attempted to insert PIV X2 on pt. No success. Pt tolerated well. IV team paged.

## 2013-02-22 ENCOUNTER — Inpatient Hospital Stay (HOSPITAL_COMMUNITY): Payer: Medicare Other

## 2013-02-22 DIAGNOSIS — R42 Dizziness and giddiness: Secondary | ICD-10-CM

## 2013-02-22 DIAGNOSIS — R079 Chest pain, unspecified: Secondary | ICD-10-CM

## 2013-02-22 DIAGNOSIS — I10 Essential (primary) hypertension: Secondary | ICD-10-CM

## 2013-02-22 DIAGNOSIS — M7989 Other specified soft tissue disorders: Secondary | ICD-10-CM

## 2013-02-22 DIAGNOSIS — E119 Type 2 diabetes mellitus without complications: Secondary | ICD-10-CM

## 2013-02-22 LAB — PREPARE RBC (CROSSMATCH)

## 2013-02-22 LAB — BASIC METABOLIC PANEL WITH GFR
BUN: 11 mg/dL (ref 6–23)
CO2: 28 meq/L (ref 19–32)
Calcium: 8.8 mg/dL (ref 8.4–10.5)
Chloride: 98 meq/L (ref 96–112)
Creatinine, Ser: 0.46 mg/dL — ABNORMAL LOW (ref 0.50–1.10)
GFR calc Af Amer: >90 mL/min
GFR calc non Af Amer: >90 mL/min
Glucose, Bld: 259 mg/dL — ABNORMAL HIGH (ref 70–99)
Potassium: 5 meq/L (ref 3.7–5.3)
Sodium: 138 meq/L (ref 137–147)

## 2013-02-22 LAB — CBC
HCT: 27.6 % — ABNORMAL LOW (ref 36.0–46.0)
Hemoglobin: 8.5 g/dL — ABNORMAL LOW (ref 12.0–15.0)
MCH: 26.5 pg (ref 26.0–34.0)
MCHC: 30.8 g/dL (ref 30.0–36.0)
MCV: 86 fL (ref 78.0–100.0)
Platelets: 431 10*3/uL — ABNORMAL HIGH (ref 150–400)
RBC: 3.21 MIL/uL — ABNORMAL LOW (ref 3.87–5.11)
RDW: 13.8 % (ref 11.5–15.5)
WBC: 6.3 10*3/uL (ref 4.0–10.5)

## 2013-02-22 LAB — LEGIONELLA ANTIGEN, URINE: LEGIONELLA ANTIGEN, URINE: NEGATIVE

## 2013-02-22 LAB — GLUCOSE, CAPILLARY
Glucose-Capillary: 159 mg/dL — ABNORMAL HIGH (ref 70–99)
Glucose-Capillary: 356 mg/dL — ABNORMAL HIGH (ref 70–99)
Glucose-Capillary: 271 mg/dL — ABNORMAL HIGH (ref 70–99)
Glucose-Capillary: 250 mg/dL — ABNORMAL HIGH (ref 70–99)

## 2013-02-22 LAB — ABO/RH: ABO/RH(D): B POS

## 2013-02-22 LAB — OCCULT BLOOD X 1 CARD TO LAB, STOOL: Fecal Occult Bld: POSITIVE — AB

## 2013-02-22 MED ORDER — LORAZEPAM 0.5 MG PO TABS
0.5000 mg | ORAL_TABLET | Freq: Two times a day (BID) | ORAL | Status: DC | PRN
  Administered 2013-02-22 – 2013-02-25 (×4): 0.5 mg via ORAL
  Filled 2013-02-22 (×4): qty 1

## 2013-02-22 MED ORDER — FERUMOXYTOL INJECTION 510 MG/17 ML
510.0000 mg | Freq: Once | INTRAVENOUS | Status: DC
Start: 2013-02-22 — End: 2013-02-22
  Filled 2013-02-22: qty 17

## 2013-02-22 MED ORDER — LORAZEPAM 2 MG/ML IJ SOLN: 0.5000 mg | Freq: Two times a day (BID) | INTRAMUSCULAR | Status: DC | PRN

## 2013-02-22 MED ORDER — LORAZEPAM 2 MG/ML IJ SOLN: 1.0000 mg | Freq: Once | INTRAMUSCULAR | Status: DC

## 2013-02-22 NOTE — Progress Notes (Signed)
FPTS PGY-2 Interim Note  Discussed again with Dr. Ree Kida. Feel CVA / TIA is unlikely, but given concern for possible bleeding as well as dizziness and recent history of arm weakness, will get CT non-contrast of head this afternoon. Discussed with pt and she has no specific questions but is concerned that she was unable to lie flat and required some sedation and "extra oxygen" for her CTA earlier this admission. Will plan to give Ativan 1 mg 15-20 minutes prior to head CT and f/u results as appropriate.  Emmaline Kluver, MD PGY-2, [facility] 02/22/2013, 4:23 PM [facility] pager: [phone] (text pages welcome through [facility])

## 2013-02-22 NOTE — Progress Notes (Signed)
FMTS Attending Note  I personally saw and evaluated the patient. The plan of care was discussed with the resident team. I agree with the assessment and plan as documented by the resident.   Please refer to H&P note written today (02/22/13)  Dossie Arbour MD

## 2013-02-22 NOTE — Progress Notes (Signed)
Family Medicine Teaching Service Daily Progress Note Intern Pager: [phone]  Patient name: Judith Boyer Medical record number: [id number] Date of birth: [birth year] Age: 68 y.o. Gender: female  Primary Care Provider: Jenny Reichmann, MD Consultants: none Code Status: full code  Pt Overview and Major Events to Date:  1/16: admitted under [facility]  -CTA no PE but ground-glass airspace disease RML+LLL  -HIV nonreactive, Strep pneumo Ag negative 1/17: transferred to FPTS  -ABG with pH 7.29 (?chronic CO2 retention)  -ECHO: EF 55-60%, mild AV stenosis  -iron panel suggests iron deficiency   Assessment and Plan:  68 year old female history of coronary disease, COPD presents with shortness of breath was found to have community acquired pneumonia.   # SOB secondary to CAP, likely. COPD exacerbation - continue vanc/zosyn, consider de-escalate if / when improves - continue supplemental O2 prn (on O2 QHS 2L at home) - need to ambulate off O2 prior to discharge, order continuous home O2 if indiated - awaiting legionella antigen - continue symbicort, singulair - xopenex nebs prn, mucinex prn for cough - continue prednisone 50mg  daily x 5 days (1/18 is day 2)  # CAD - trop negative x 3 - continue simvastatin, plavix, aspirin  # Hypertension  - stable, continue home ARB  # Anemia  - anemia panel as above, Hb stable around 8-8.5, but baseline ~11 - ordered for FOBT - initially ordered for Feraheme, but will transfuse 1 unit (given CAD, symptomatic anemia) - trend CBC  # Weight loss  - recommend outpatient workup, likely needs colonoscopy - if FOBT positive, will consider GI consult  # Diabetes: Hb A1c 9.1 - SSI while hospitalized, hold home metformin  FEN/GI: diabetic diet, SLIV PPx: D/C Lovenox and start SCD's 1/18 given transfusion and checking for FOBT  Disposition: management as above, discharge planning pending clinical improvement (anticipate 1-2 more  days inpt)  Subjective: Remains with significant SOB worse than baseline, ?some improvement since admit. Appetite is "okay," denies N/V, abdominal pain / chest pain. Does describe some dizziness ?worse with movement.   Objective: Temp:  [97.4 F (36.3 C)-98.2 F (36.8 C)] 98.2 F (36.8 C) (01/18 0814) Pulse Rate:  [110-112] 112 (01/18 0355) Resp:  [18-27] 18 (01/18 0355) BP: (119-137)/(55-60) 119/58 mmHg (01/18 0900) SpO2:  [95 %-99 %] 95 % (01/18 0827) Physical Exam: General: elderly female in NAD, [place] in place Cardiovascular: RRR but heart sounds distant Respiratory: diminished air movement throughout, moderate increased WOB with movement for exam Abdomen: soft, nontender, BS+ Extremities: warm, well-perfused, trace LE edema  Laboratory: CBC  Recent Labs Lab 02/20/13 1618 02/21/13 1355 02/22/13 0240  WBC 7.6 6.6 6.3  HGB 8.9* 8.1* 8.5*  HCT 29.0* 26.5* 27.6*  PLT 500* 437* 431*     CMET  Recent Labs Lab 02/20/13 1618 02/21/13 0540 02/22/13 0240  NA 138 140 138  K 4.3 4.3 5.0  CL 97 100 98  CO2 28 30 28   BUN 10 7 11   CREATININE 0.40* 0.44* 0.46*  GLUCOSE 221* 174* 259*  CALCIUM 8.8 8.4 8.8  AST  --  17  --   ALT  --  15  --   ALKPHOS  --  84  --   PROT  --  5.9*  --   ALBUMIN  --  3.2*  --       Imaging/Diagnostic Tests:  CTA Chest,  1/16 @1817  - no PE, small ground-glass airspace disease RML and LLL, ?infectious vs inflammatory - radiologist recommends f/u  noncontrast CT in 3 months - underlying atherosclerosis (left main, 3 vessel CAD) and COPD  CXR: No acute cardiopulmonary abnormality.  Sharon [address], MD 02/22/2013, 10:05 AM PGY-2, [facility] Intern pager: [phone], text pages welcome

## 2013-02-22 NOTE — Progress Notes (Addendum)
FMTS Attending Note Initial History and Physical  I personally saw and evaluated the patient. The plan of care was discussed with the resident team. I agree with the assessment and plan as documented by the resident. Patient evaluated at 10:00 AM 02/22/13.  68 y/o female with PMH COPD, CAD, HTN, Diabetes admitted to [facility] with suspected CAP vs. COPD Exacerbation. Care transferred to [facility] on 02/21/13. Patient reports few week history of worsening dyspnea, she normally needs 2 L [place] oxygen at night however had been no wearing during the day, increase exertional dyspnea, she reports associated substernal chest pain which she describes as a heavy sensation, no radiation of chest pain, no nausea, no diaphoresis, she reports two pillow orthopnea which is stable for the patient, no PND, has had increased lower extremity swelling for the past few weeks without a history of heart failure  Patient also reports intermittent dizziness for the past few weeks, will occur when sitting however is usually worse when she is standing, occasionally occurs when she turns her head to the side but not every time, no syncope, no vision change, chronic left 4th/5th digit numbness with intermittent cramping, a few weeks ago had short episode of left arm weakness, resolved spontaneously after a few minutes, did not seek medical care at that time, no recurrence of symptoms, no current weakness/numbness of extremities, no slurred speech   Vitals: reviewed GEN: pleasant Caucasian Female, NAD, slightly dyspneic, on 2L [place] HEENT: pupils equal in size, no scleral icterus, nasal septum midline, no rhinorrhea, MMM, uvula midline, neck supple, no anterior or posterior cervical adenopathy Cardiac: RRR, S1 and S2 present, 2/6 systolic murmurs, no heaves/thrills, right carotid bruit (?transmitted from Aortic Stenosis) Resp: poor air entry bilateral lung fields, patient dyspneic Abd: soft,  no tenderness, normal bowel sounds EXT: trace pedal edema, 2+ radial and DP pulses Neuro: CN 2-12 intact (except did not test pupil reflex), strength 5/5 in all extremities, sensation to light touch grossly intact, able to perform finger to nose bilaterally, pronator drift negative, rhomberg negative  Reviewed lab work, ecg, and imaging studies  1. Dyspnea - CAP vs COPD, favor COPD as CTA chest showed no large consolidation, agree with treatment as outlined in resident note, will need repeat CT chest as outpatient to document resolution of ground glass opacities.  2. CAD/chest pain - cardiac workup negative, CTA negative for pulmonary emboli, continue Simvastatin, Plavix, and ASA, chest pain likely MSK from increased work of breathing 3. Anemia - FOBT pending, will transfuse 1 U PRBC's given history of CAD 4. TIA two weeks ago with left arm weakness - Echo showed EF 55-60% and mild aortic stenosis (no thrombus), with new carotid bruit on the right side (? Bruit vs. Transmission from AS) will check carotid US, already on proper secondary prevention with ASA/Plavix/Statin, check CT head to rule out acute hemorrage/large stroke 5. Dizziness - suspect due to anemia and recent hypoxia, may have component of BPPV and will need [facility] performed when clinically improved 6. HTN - stable 7. Recent weight loss of unclear origin - check FOBT and if positive will likely need colonoscopy from GI 8. Diabetes - ISS 9. Lower extremity swelling - likely multifactorial from anemia, low albumin, and venous insufficiency. No evidence of heart failure based on Echo and kidney function wnl.   Dossie Arbour MD

## 2013-02-23 DIAGNOSIS — F17200 Nicotine dependence, unspecified, uncomplicated: Secondary | ICD-10-CM

## 2013-02-23 DIAGNOSIS — J441 Chronic obstructive pulmonary disease with (acute) exacerbation: Secondary | ICD-10-CM

## 2013-02-23 DIAGNOSIS — J449 Chronic obstructive pulmonary disease, unspecified: Secondary | ICD-10-CM

## 2013-02-23 DIAGNOSIS — J4489 Other specified chronic obstructive pulmonary disease: Secondary | ICD-10-CM

## 2013-02-23 DIAGNOSIS — R0989 Other specified symptoms and signs involving the circulatory and respiratory systems: Secondary | ICD-10-CM

## 2013-02-23 LAB — CBC
HEMATOCRIT: 29.2 % — AB (ref 36.0–46.0)
HEMOGLOBIN: 9.2 g/dL — AB (ref 12.0–15.0)
MCH: 26.7 pg (ref 26.0–34.0)
MCHC: 31.5 g/dL (ref 30.0–36.0)
MCV: 84.9 fL (ref 78.0–100.0)
Platelets: 387 10*3/uL (ref 150–400)
RBC: 3.44 MIL/uL — AB (ref 3.87–5.11)
RDW: 13.8 % (ref 11.5–15.5)
WBC: 8.9 10*3/uL (ref 4.0–10.5)

## 2013-02-23 LAB — TYPE AND SCREEN
ABO/RH(D): B POS
Antibody Screen: NEGATIVE
UNIT DIVISION: 0

## 2013-02-23 LAB — GLUCOSE, CAPILLARY
GLUCOSE-CAPILLARY: 239 mg/dL — AB (ref 70–99)
GLUCOSE-CAPILLARY: 283 mg/dL — AB (ref 70–99)
GLUCOSE-CAPILLARY: 320 mg/dL — AB (ref 70–99)
Glucose-Capillary: 161 mg/dL — ABNORMAL HIGH (ref 70–99)

## 2013-02-23 LAB — BASIC METABOLIC PANEL
BUN: 15 mg/dL (ref 6–23)
CHLORIDE: 101 meq/L (ref 96–112)
CO2: 32 meq/L (ref 19–32)
Calcium: 8.7 mg/dL (ref 8.4–10.5)
Creatinine, Ser: 0.53 mg/dL (ref 0.50–1.10)
GFR calc Af Amer: >90 mL/min (ref >90)
GFR calc non Af Amer: >90 mL/min (ref >90)
Glucose, Bld: 217 mg/dL — ABNORMAL HIGH (ref 70–99)
POTASSIUM: 4.3 meq/L (ref 3.7–5.3)
Sodium: 141 mEq/L (ref 137–147)

## 2013-02-23 LAB — MAGNESIUM: MAGNESIUM: 2.1 mg/dL (ref 1.5–2.5)

## 2013-02-23 LAB — PHOSPHORUS: Phosphorus: 4.2 mg/dL (ref 2.3–4.6)

## 2013-02-23 MED ORDER — LEVALBUTEROL HCL 0.63 MG/3ML IN NEBU
0.6300 mg | INHALATION_SOLUTION | RESPIRATORY_TRACT | Status: DC
  Administered 2013-02-23 – 2013-02-24 (×5): 0.63 mg via RESPIRATORY_TRACT
  Filled 2013-02-23 (×10): qty 3

## 2013-02-23 MED ORDER — IPRATROPIUM BROMIDE 0.02 % IN SOLN
0.5000 mg | RESPIRATORY_TRACT | Status: DC
  Administered 2013-02-23 – 2013-02-24 (×5): 0.5 mg via RESPIRATORY_TRACT
  Filled 2013-02-23 (×5): qty 2.5

## 2013-02-23 MED ORDER — IPRATROPIUM BROMIDE 0.02 % IN SOLN
0.5000 mg | Freq: Four times a day (QID) | RESPIRATORY_TRACT | Status: DC
  Administered 2013-02-23: 0.5 mg via RESPIRATORY_TRACT
  Filled 2013-02-23: qty 2.5

## 2013-02-23 MED ORDER — LEVALBUTEROL HCL 0.63 MG/3ML IN NEBU
0.6300 mg | INHALATION_SOLUTION | RESPIRATORY_TRACT | Status: DC
  Filled 2013-02-23 (×4): qty 3

## 2013-02-23 MED ORDER — LORAZEPAM 0.5 MG PO TABS
0.5000 mg | ORAL_TABLET | Freq: Two times a day (BID) | ORAL | Status: DC
  Administered 2013-02-23 – 2013-02-26 (×6): 0.5 mg via ORAL
  Filled 2013-02-23 (×6): qty 1

## 2013-02-23 MED ORDER — LEVALBUTEROL HCL 0.63 MG/3ML IN NEBU
0.6300 mg | INHALATION_SOLUTION | Freq: Four times a day (QID) | RESPIRATORY_TRACT | Status: DC
  Administered 2013-02-23: 0.63 mg via RESPIRATORY_TRACT
  Filled 2013-02-23 (×4): qty 3

## 2013-02-23 MED ORDER — FLUOXETINE HCL 10 MG PO CAPS
10.0000 mg | ORAL_CAPSULE | Freq: Every day | ORAL | Status: DC
  Administered 2013-02-23 – 2013-02-26 (×4): 10 mg via ORAL
  Filled 2013-02-23 (×4): qty 1

## 2013-02-23 NOTE — Consult Note (Signed)
PULMONARY  / CRITICAL CARE MEDICINE  Name: Judith Boyer MRN: [id number] DOB: 1945-10-09 PCP Jenny Reichmann, MD Pulmonary : Dr Melvyn Novas   ADMISSION DATE:  02/20/2013  LOS 3 days   CONSULTATION DATE:  02/23/13  REFERRING MD : FPTS Dr Andria Frames PRIMARY SERVICE: FPTS  CHIEF COMPLAINT:  Copd consult  BRIEF PATIENT DESCRIPTION: COPD eval, dc planning  SIGNIFICANT EVENTS / STUDIES:  02/10/13: spirometry fev1 0.6L/33%, Gold stage 3-4 copd 1/16: admitted under [facility]  -CTA no PE but ground-glass airspace disease RML+LLL  -HIV nonreactive, Strep pneumo Ag negative  1/17: transferred to FPTS  -ABG with pH 7.29 (?chronic CO2 retention)  -ECHO: EF 55-60%, mild AV stenosis  -iron panel suggests iron deficiency  1/18: transfused 1 U PRBC's (hgb 8.5gm%) 1/19: transfer out of step down   LINES / TUBES: PIV  CULTURES: mrsa [facility] 02/20/13 >>NEG Urine strep 1/16>>neg RESP VIRUS MULTIPLEX PANEL 02/23/13>> Urine leg 1/19>>  ANTIBIOTICS: vanc 1/16>> Zosyn 1/16>>   HISTORY OF PRESENT ILLNESS:  68 year old COPD patient followed by Dr. Legrand Como wert in the pulmonary office. She is Gold stage III-4 COPD. Patient lives alone and does continue to smoke as of 3 weeks ago. Her son lives next door. She reports that 3 months ago as she used to be able to drive and do all activities of daily living including cleaning herself and the house. However in the past 3 months due to progressive dyspnea of insidious onset her functional capabilities have declined. She is now finding it extremely difficult to do groceries and even walk short distances. She's had 2 pulmonary office visits in the last 1 month and apparently recently was started on Spiriva but she reports continued decline and therefore was admitted to the hospital the family practice teaching service on 02/20/2013. Evaluation here has shown pneumonia on CT scan of the chest [my personal opinion is that this is viral pneumonitis] and  she's been treated with broad-spectrum antibiotics along with prednisone and bronchodilators for COPD exacerbation. However she's had dyspnea that is slow to resolve. Therefore per medical care consult has been involved 02/23/2013 particularly to help with her COPD and discharge planning. Patient currently and says that she only wants to go home  PAST MEDICAL HISTORY :   #COPD =- follows with Dr Melvyn Novas. Details as of 02/10/13 at time of last OV with thim  - 12/30/2012 Walked RA x 3 laps @ 185 ft each stopped due to legs shaky, sats 94%  - 01/13/2013 Walked RA x 3 laps @ 185 ft each stopped due to End of study, sats 93%  - added tudorza 01/27/13 and d/c duoneb/ atrovent  - spirometry fev1 0.6L/33%, Gold stage 3-4 copd  #continued smoking  - quit 3 weeks prior to admission Past Medical History  Diagnosis Date  . Myocardial infarction   . COPD (chronic obstructive pulmonary disease)   . Hypertension   . Coronary artery disease   . Shortness of breath   . Diabetes mellitus without complication     type 2     Family History  Problem Relation Age of Onset  . Heart disease Maternal Grandmother   . Brain cancer Brother   . Lung cancer Mother     smoked     History   Social History  . Marital Status: Widowed    Spouse Name: N/A    Number of Children: N/A  . Years of Education: N/A   Occupational History  . Not on file.  Social History Main Topics  . Smoking status: Current Every Day Smoker -- 0.50 packs/day for 50 years    Types: Cigarettes    Last Attempt to Quit: 01/10/2013  . Smokeless tobacco: Never Used  . Alcohol Use: No  . Drug Use: No  . Sexual Activity: Not on file   Other Topics Concern  . Not on file   Social History Narrative  . No narrative on file     Allergies  Allergen Reactions  . Doxycycline Anaphylaxis  . Codeine Other (See Comments)    hyperactivity  . Lisinopril Cough  . Spiriva Handihaler [Tiotropium Bromide Monohydrate]     Rash and Urinary  Retention  . Adhesive [Tape] Rash  . Dulera [Mometasone Furo-Formoterol Fum] Rash      (Not in an outpatient encounter)     REVIEW OF SYSTEMS:  11 POINT ROS NEGATIVE OTHER THAN PER HPI  SUBJECTIVE:   VITAL SIGNS: Filed Vitals:   02/23/13 0700 02/23/13 0823 02/23/13 1200 02/23/13 1410  BP: 134/51  138/67   Pulse: 102     Temp: 98 F (36.7 C)  97.9 F (36.6 C)   TempSrc: Oral  Oral   Resp: 23     Height:      Weight:      SpO2: 100% 100%  98%        INTAKE / OUTPUT: I/O last 3 completed shifts: In: 1137.5 [I.V.:250; Blood:287.5; IV Piggyback:600] Out: 2650 [Urine:2650]     PHYSICAL EXAMINATION: General:  Elderly deconditioned female. Sitting in bed with no distress Neuro:  RASS +1. CAM-ICU negative for delirium. Moves all 4s.  HEENT:  NEck supple. No elevated JVP Cardiovascular:  S1S2+. RRR. No murmurs Lungs:  Kyphitic. Barrell chest. AE dminished overall. No active wheeze Abdomen:  Soft, no mass, non tender Musculoskeletal:  No cyanosis, no clubbing, no edema Skin:  intact  LABS: PULMONARY  Recent Labs Lab 02/20/13 2213  PHART 7.291*  PCO2ART 63.4*  PO2ART 141.0*  HCO3 29.6*  TCO2 31.5  O2SAT 99.7    CBC  Recent Labs Lab 02/21/13 1355 02/22/13 0240 02/23/13 0242  HGB 8.1* 8.5* 9.2*  HCT 26.5* 27.6* 29.2*  WBC 6.6 6.3 8.9  PLT 437* 431* 387    COAGULATION No results found for this basename: INR,  in the last 168 hours  CARDIAC   Recent Labs Lab 02/20/13 2315 02/21/13 0540 02/21/13 0957  TROPONINI <0.30 <0.30 <0.30    Recent Labs Lab 02/20/13 1618  PROBNP 25.3     CHEMISTRY  Recent Labs Lab 02/20/13 1618 02/21/13 0540 02/22/13 0240 02/23/13 0242  NA 138 140 138 141  K 4.3 4.3 5.0 4.3  CL 97 100 98 101  CO2 28 30 28  32  GLUCOSE 221* 174* 259* 217*  BUN 10 7 11 15   CREATININE 0.40* 0.44* 0.46* 0.53  CALCIUM 8.8 8.4 8.8 8.7  MG  --   --   --  2.1  PHOS  --   --   --  4.2   Estimated Creatinine Clearance:  50.8 ml/min (by C-G formula based on Cr of 0.53).   LIVER  Recent Labs Lab 02/21/13 0540  AST 17  ALT 15  ALKPHOS 84  BILITOT <0.2*  PROT 5.9*  ALBUMIN 3.2*     INFECTIOUS No results found for this basename: LATICACIDVEN, PROCALCITON,  in the last 168 hours   ENDOCRINE CBG (last 3)   Recent Labs  02/22/13 2150 02/23/13 0813 02/23/13 1228  GLUCAP 250*  239* 283*         IMAGING x48h  Ct Head Wo Contrast  02/23/2013   CLINICAL DATA:  Dizziness and weakness.  EXAM: CT HEAD WITHOUT CONTRAST  TECHNIQUE: Contiguous axial images were obtained from the base of the skull through the vertex without intravenous contrast.  COMPARISON:  None.  FINDINGS: There is no evidence of acute infarction, mass lesion, or intra- or extra-axial hemorrhage on CT.  Prominence of the sulci suggests mild cortical volume loss.  The posterior fossa, including the cerebellum, brainstem and fourth ventricle, is within normal limits. The third and lateral ventricles, and basal ganglia are unremarkable in appearance. The cerebral hemispheres are symmetric in appearance, with normal gray-white differentiation. No mass effect or midline shift is seen.  There is no evidence of fracture; visualized osseous structures are unremarkable in appearance. The orbits are within normal limits. The paranasal sinuses and mastoid air cells are well-aerated. No significant soft tissue abnormalities are seen.  IMPRESSION: 1. No acute intracranial pathology seen on CT. 2. Mild cortical volume loss noted.   Electronically Signed   By: Garald Balding M.D.   On: 02/23/2013 01:32       ASSESSMENT / PLAN:  PULMONARY A: Suspect viral AECOPD and Pna In addition, COPD with failure to thrive and extreme deconditoning with class 4 dyspnea  P:   Aggressive BD Continue prednisone Check u rine legionalla and resp virus multiplex PCR panel Agree with ativan prn for dyspnea and scheduled for dyspnea but better dyspnea agent would  be morphine: would do this in day time 02/24/13 under    PCCM supervision or palliative care Might not be a bad idea to get palliative care for symptom mgmt and goals of care with dc planning Continue o2 Will continue to follow Will need home PT at dc or SNF rehab    Dr. Brand Males, M.D., [facility]: [facility] Rhonda W.C.P Pulmonary and Critical Care Medicine Staff Physician [place] Pager: [phone], If no answer or between  15:00h - 7:00h: call [phone]  02/23/2013 4:25 PM

## 2013-02-23 NOTE — Progress Notes (Signed)
ANTIBIOTIC CONSULT NOTE - FOLLOW UP  Pharmacy Consult for Vancomycin + Zosyn Indication: pneumonia  Allergies  Allergen Reactions  . Doxycycline Anaphylaxis  . Codeine Other (See Comments)    hyperactivity  . Lisinopril Cough  . Spiriva Handihaler [Tiotropium Bromide Monohydrate]     Rash and Urinary Retention  . Adhesive [Tape] Rash  . [facility] [Mometasone Furo-Formoterol Fum] Rash   Patient Measurements: Height: 5\' 1"  (154.9 cm) Weight: 106 lb 4.2 oz (48.2 kg) IBW/kg (Calculated) : 47.8  Vital Signs: Temp: 97.9 F (36.6 C) (01/19 1200) Temp src: Oral (01/19 1200) BP: 138/67 mmHg (01/19 1200) Pulse Rate: 102 (01/19 0700) Intake/Output from previous day: 01/18 0701 - 01/19 0700 In: 837.5 [I.V.:250; Blood:287.5; IV Piggyback:300] Out: 750 [Urine:750] Intake/Output from this shift: Total I/O In: -  Out: 200 [Urine:200]  Labs:  Recent Labs  02/21/13 0540 02/21/13 1355 02/22/13 0240 02/23/13 0242  WBC  --  6.6 6.3 8.9  HGB  --  8.1* 8.5* 9.2*  PLT  --  437* 431* 387  CREATININE 0.44*  --  0.46* 0.53   Estimated Creatinine Clearance: 50.8 ml/min (by C-G formula based on Cr of 0.53).  Assessment: 67yof continues on day#4 vancomycin and zosyn for CAP. Renal function is stable.  1/16 vancomycin>> 1/16 zosyn>>  1/16 Urinary strep>>neg   Goal of Therapy:  Vancomycin trough level 15-20 mcg/ml  Plan:  1) Continue vancomycin 1g IV q24 2) Continue zosyn 3.375g IV a8 3) Consider changing zosyn to ceftriaxone for CAP  Deboraha Sprang 02/23/2013,2:46 PM

## 2013-02-23 NOTE — Progress Notes (Signed)
Utilization review completed.  

## 2013-02-23 NOTE — Consult Note (Signed)
[facility] Consult Note  Referring Provider: No ref. provider found Primary Care Physician:  Jenny Reichmann, MD Primary Gastroenterologist:  Dr.  Laurel Dimmer Complaint: Anemia and heme positive stool HPI: Judith Boyer is an 68 y.o. quite female  admitted with COPD exacerbation community-acquired pneumonia. During admission she is found to have occult guaiac positive stools and hemoglobin of 8.9. She has a history of colon polyps her last colonoscopy was in 2005. 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 She denies any rectal bleeding change in stool caliber or significant dyspeptic symptoms  Past Medical History  Diagnosis Date  . Myocardial infarction   . COPD (chronic obstructive pulmonary disease)   . Hypertension   . Coronary artery disease   . Shortness of breath   . Diabetes mellitus without complication     type 2    Past Surgical History  Procedure Laterality Date  . Abdominal hysterectomy    . Shoulder surgery    . Coronary stent placement      Medications Prior to Admission  Medication Dose Route Frequency Provider Last Rate Last Dose  . albuterol (PROVENTIL) (2.5 MG/3ML) 0.083% nebulizer solution 2.5 mg  2.5 mg Nebulization Once Mancel Bale, PA-C       Medications Prior to Admission  Medication Sig Dispense Refill  . albuterol (PROAIR HFA) 108 (90 BASE) MCG/ACT inhaler Inhale 2 puffs into the lungs every 4 (four) hours as needed for wheezing or shortness of breath.  1 Inhaler  11  . albuterol (PROVENTIL) (2.5 MG/3ML) 0.083% nebulizer solution Take 3 mLs (2.5 mg total) by nebulization every 4 (four) hours as needed for wheezing.  150 mL  5  . aspirin 325 MG EC tablet Take 325 mg by mouth every other day.       . clopidogrel (PLAVIX) 75 MG tablet Take 1 tablet (75 mg total) by mouth every morning.  30 tablet  6  . dextromethorphan-guaiFENesin  (MUCINEX DM) 30-600 MG per 12 hr tablet Take 1 tablet by mouth every 12 (twelve) hours as needed. For cough      . famotidine (PEPCID) 20 MG tablet One at bedtime  30 tablet  2  . Fluticasone-Salmeterol (ADVAIR) 500-50 MCG/DOSE AEPB Inhale 1 puff into the lungs 2 (two) times daily.      Marland Kitchen ipratropium-albuterol (DUONEB) 0.5-2.5 (3) MG/3ML SOLN Take 3 mLs by nebulization 3 (three) times daily. Take 1 vial at 9:00AM, 3:00pm and 10:00pm      . metFORMIN (GLUCOPHAGE) 1000 MG tablet Take 1,000 mg by mouth 2 (two) times daily. Take 1 tablet in the morning and 1 tablet in the evening      . montelukast (SINGULAIR) 10 MG tablet Take 1 tablet (10 mg total) by mouth at bedtime.  30 tablet  6  . nitroGLYCERIN (NITROSTAT) 0.4 MG SL tablet Place 1 tablet (0.4 mg total) under the tongue every 5 (five) minutes x 3 doses as needed for chest pain.  25 tablet  1  . pantoprazole (PROTONIX) 40 MG tablet Take 1 tablet (40 mg total) by mouth daily. Take 30-60 min before first meal of the day  30 tablet  2  . pravastatin (PRAVACHOL) 80 MG tablet Take 1 tablet (80 mg total) by mouth every evening.  30 tablet  11  . valsartan (DIOVAN) 80 MG tablet Take 1 tablet (80 mg total) by mouth  daily.  30 tablet  11  . Respiratory Therapy Supplies (FLUTTER) DEVI Use as directed  1 each  0    Allergies:  Allergies  Allergen Reactions  . Doxycycline Anaphylaxis  . Codeine Other (See Comments)    hyperactivity  . Lisinopril Cough  . Adhesive [Tape] Rash  . Dulera [Mometasone Furo-Formoterol Fum] Rash    Family History  Problem Relation Age of Onset  . Heart disease Maternal Grandmother   . Brain cancer Brother   . Lung cancer Mother     smoked    Social History:  reports that she has been smoking Cigarettes.  She has a 25 pack-year smoking history. She has never used smokeless tobacco. She reports that she does not drink alcohol or use illicit drugs.  Review of Systems: negative except as above   Blood pressure 134/51,  pulse 102, temperature 98 F (36.7 C), temperature source Oral, resp. rate 23, height $RemoveBe'5\' 1"'QGdzcoNeT$  (1.549 m), weight 48.2 kg (106 lb 4.2 oz), SpO2 100.00%. Head: Normocephalic, without obvious abnormality, atraumatic Neck: no adenopathy, no carotid bruit, no JVD, supple, symmetrical, trachea midline and thyroid not enlarged, symmetric, no tenderness/mass/nodules Resp: clear to auscultation bilaterally Cardio: regular rate and rhythm, S1, S2 normal, no murmur, click, rub or gallop GI: Abdomen soft nondistended with normoactive bowel sounds. No hepatosplenomegaly mass or guarding Extremities: extremities normal, atraumatic, no cyanosis or edema  Results for orders placed during the hospital encounter of 02/20/13 (from the past 48 hour(s))  CBC     Status: Abnormal   Collection Time    02/21/13  1:55 PM      Result Value Range   WBC 6.6  4.0 - 10.5 K/uL   RBC 3.05 (*) 3.87 - 5.11 MIL/uL   Hemoglobin 8.1 (*) 12.0 - 15.0 g/dL   HCT 26.5 (*) 36.0 - 46.0 %   MCV 86.9  78.0 - 100.0 fL   MCH 26.6  26.0 - 34.0 pg   MCHC 30.6  30.0 - 36.0 g/dL   RDW 13.8  11.5 - 15.5 %   Platelets 437 (*) 150 - 400 K/uL  GLUCOSE, CAPILLARY     Status: Abnormal   Collection Time    02/21/13  6:11 PM      Result Value Range   Glucose-Capillary 223 (*) 70 - 99 mg/dL  GLUCOSE, CAPILLARY     Status: Abnormal   Collection Time    02/21/13 10:02 PM      Result Value Range   Glucose-Capillary 263 (*) 70 - 99 mg/dL   Comment 1 Notify RN    CBC     Status: Abnormal   Collection Time    02/22/13  2:40 AM      Result Value Range   WBC 6.3  4.0 - 10.5 K/uL   RBC 3.21 (*) 3.87 - 5.11 MIL/uL   Hemoglobin 8.5 (*) 12.0 - 15.0 g/dL   HCT 27.6 (*) 36.0 - 46.0 %   MCV 86.0  78.0 - 100.0 fL   MCH 26.5  26.0 - 34.0 pg   MCHC 30.8  30.0 - 36.0 g/dL   RDW 13.8  11.5 - 15.5 %   Platelets 431 (*) 150 - 400 K/uL  BASIC METABOLIC PANEL     Status: Abnormal   Collection Time    02/22/13  2:40 AM      Result Value Range   Sodium  138  137 - 147 mEq/L   Potassium 5.0  3.7 - 5.3 mEq/L  Chloride 98  96 - 112 mEq/L   CO2 28  19 - 32 mEq/L   Glucose, Bld 259 (*) 70 - 99 mg/dL   BUN 11  6 - 23 mg/dL   Creatinine, Ser 0.46 (*) 0.50 - 1.10 mg/dL   Calcium 8.8  8.4 - 10.5 mg/dL   GFR calc non Af Amer >90  >90 mL/min   GFR calc Af Amer >90  >90 mL/min   Comment: (NOTE)     The eGFR has been calculated using the CKD EPI equation.     This calculation has not been validated in all clinical situations.     eGFR's persistently <90 mL/min signify possible Chronic Kidney     Disease.  GLUCOSE, CAPILLARY     Status: Abnormal   Collection Time    02/22/13  8:25 AM      Result Value Range   Glucose-Capillary 159 (*) 70 - 99 mg/dL  OCCULT BLOOD X 1 CARD TO LAB, STOOL     Status: Abnormal   Collection Time    02/22/13  9:54 AM      Result Value Range   Fecal Occult Bld POSITIVE (*) NEGATIVE  TYPE AND SCREEN     Status: None   Collection Time    02/22/13 11:50 AM      Result Value Range   ABO/RH(D) B POS     Antibody Screen NEG     Sample Expiration 02/25/2013     Unit Number [id number]     Blood Component Type RED CELLS,LR     Unit division 00     Status of Unit ISSUED,FINAL     Transfusion Status OK TO TRANSFUSE     Crossmatch Result Compatible    PREPARE RBC (CROSSMATCH)     Status: None   Collection Time    02/22/13 11:50 AM      Result Value Range   Order Confirmation ORDER PROCESSED BY BLOOD BANK    ABO/RH     Status: None   Collection Time    02/22/13 11:50 AM      Result Value Range   ABO/RH(D) B POS    GLUCOSE, CAPILLARY     Status: Abnormal   Collection Time    02/22/13 12:29 PM      Result Value Range   Glucose-Capillary 356 (*) 70 - 99 mg/dL  GLUCOSE, CAPILLARY     Status: Abnormal   Collection Time    02/22/13  5:10 PM      Result Value Range   Glucose-Capillary 271 (*) 70 - 99 mg/dL  GLUCOSE, CAPILLARY     Status: Abnormal   Collection Time    02/22/13  9:50 PM      Result Value Range    Glucose-Capillary 250 (*) 70 - 99 mg/dL   Comment 1 Notify RN    CBC     Status: Abnormal   Collection Time    02/23/13  2:42 AM      Result Value Range   WBC 8.9  4.0 - 10.5 K/uL   RBC 3.44 (*) 3.87 - 5.11 MIL/uL   Hemoglobin 9.2 (*) 12.0 - 15.0 g/dL   HCT 29.2 (*) 36.0 - 46.0 %   MCV 84.9  78.0 - 100.0 fL   MCH 26.7  26.0 - 34.0 pg   MCHC 31.5  30.0 - 36.0 g/dL   RDW 13.8  11.5 - 15.5 %   Platelets 387  150 - 400 K/uL  MAGNESIUM  Status: None   Collection Time    02/23/13  2:42 AM      Result Value Range   Magnesium 2.1  1.5 - 2.5 mg/dL  PHOSPHORUS     Status: None   Collection Time    02/23/13  2:42 AM      Result Value Range   Phosphorus 4.2  2.3 - 4.6 mg/dL  BASIC METABOLIC PANEL     Status: Abnormal   Collection Time    02/23/13  2:42 AM      Result Value Range   Sodium 141  137 - 147 mEq/L   Potassium 4.3  3.7 - 5.3 mEq/L   Chloride 101  96 - 112 mEq/L   CO2 32  19 - 32 mEq/L   Glucose, Bld 217 (*) 70 - 99 mg/dL   BUN 15  6 - 23 mg/dL   Creatinine, Ser 0.53  0.50 - 1.10 mg/dL   Calcium 8.7  8.4 - 10.5 mg/dL   GFR calc non Af Amer >90  >90 mL/min   GFR calc Af Amer >90  >90 mL/min   Comment: (NOTE)     The eGFR has been calculated using the CKD EPI equation.     This calculation has not been validated in all clinical situations.     eGFR's persistently <90 mL/min signify possible Chronic Kidney     Disease.  GLUCOSE, CAPILLARY     Status: Abnormal   Collection Time    02/23/13  8:13 AM      Result Value Range   Glucose-Capillary 239 (*) 70 - 99 mg/dL   Ct Head Wo Contrast  02/23/2013   CLINICAL DATA:  Dizziness and weakness.  EXAM: CT HEAD WITHOUT CONTRAST  TECHNIQUE: Contiguous axial images were obtained from the base of the skull through the vertex without intravenous contrast.  COMPARISON:  None.  FINDINGS: There is no evidence of acute infarction, mass lesion, or intra- or extra-axial hemorrhage on CT.  Prominence of the sulci suggests mild cortical  volume loss.  The posterior fossa, including the cerebellum, brainstem and fourth ventricle, is within normal limits. The third and lateral ventricles, and basal ganglia are unremarkable in appearance. The cerebral hemispheres are symmetric in appearance, with normal gray-white differentiation. No mass effect or midline shift is seen.  There is no evidence of fracture; visualized osseous structures are unremarkable in appearance. The orbits are within normal limits. The paranasal sinuses and mastoid air cells are well-aerated. No significant soft tissue abnormalities are seen.  IMPRESSION: 1. No acute intracranial pathology seen on CT. 2. Mild cortical volume loss noted.   Electronically Signed   By: Garald Balding M.D.   On: 02/23/2013 01:32    Assessment: Anemia and heme positive stool History of colon polyps overdue for surveillance Plan:  Needs updating of colon cancer screening. I would favor doing this as an outpatient. Will arrange followup with me for colonoscopy set up after she has recovered sufficiently from her respiratory issues. Thank you for the consult. YIAXK,PVVZ C 02/23/2013, 12:44 PM

## 2013-02-23 NOTE — Progress Notes (Signed)
Family Medicine Teaching Service Daily Progress Note Intern Pager: [phone]  Patient name: Judith Boyer Medical record number: [id number] Date of birth: 07-19-1945 Age: 68 y.o. Gender: female  Primary Care Provider: Jenny Reichmann, MD Consultants: none Code Status: full code  Pt Overview and Major Events to Date:  1/16: admitted under [facility]  -CTA no PE but ground-glass airspace disease RML+LLL  -HIV nonreactive, Strep pneumo Ag negative 1/17: transferred to FPTS  -ABG with pH 7.29 (?chronic CO2 retention)  -ECHO: EF 55-60%, mild AV stenosis  -iron panel suggests iron deficiency 1/18: transfused 1 U PRBC's  1/19: transfer out of step down.   Assessment and Plan: 68 year old female history of coronary disease, COPD presents with shortness of breath was found to have community acquired pneumonia.   # SOB secondary to CAP, likely. COPD exacerbation:  - continue vanc/zosyn (1/17), consider de-escalate if / when improves - continue supplemental O2 prn (on O2 QHS 2L at home) - need to ambulate off O2 prior to discharge, order continuous home O2 if indiated - legionella antigen: negative  - HIV: negative  - continue singulair and symbicort ,  - xopenex nebs prn, mucinex prn for cough - continue prednisone 50mg  daily x 5 days (1/18 is day 3)  # CAD: - trop negative x 3 - continue simvastatin,  - holding plavix, aspirin  #Anxeity: patient reports to feeling anxious about her present hospital admission and having symptoms relating to agoraphobia at baseline    - Starting prozac  - scheduled ativan in the short term.   # Hypertension  - stable, continue home ARB  # Normocytic Anemia:  Due to anemia of chronic disease vs iron deficiency. Will hold iron studies due to having received a transfusion.  - transfused 1 U  - anemia panel as above, Hb stable around 8-8.5, but baseline ~11 - CBC   # Weight loss  - GI to see today.  - FOBT positive  #  Diabetes: Hb A1c 9.1 - SSI while hospitalized, hold home metformin  FEN/GI: diabetic diet, SLIV PPx: D/C Lovenox and start SCD's 1/18 given transfusion and FOBT +  Disposition: management as above, discharge planning pending clinical improvement (anticipate 1-2 more days inpt)  Subjective: Patient sitting up in bed. She was receiving a breathing treatment. She did not have any overnight events.   Objective: Temp:  [97.5 F (36.4 C)-98.6 F (37 C)] 98 F (36.7 C) (01/19 0700) Pulse Rate:  [98-115] 102 (01/19 0700) Resp:  [22-25] 23 (01/19 0700) BP: (120-139)/(46-61) 134/51 mmHg (01/19 0700) SpO2:  [94 %-100 %] 100 % (01/19 0823) Physical Exam: General: elderly female in NAD, Guth[place] in place Cardiovascular: Tachycardic but heart sounds distant Respiratory: diminished air movement throughout, normal work of breathing.  Abdomen: soft, nontender, BS+ Extremities: warm, well-perfused, trace LE edema  Laboratory: CBC  Recent Labs Lab 02/21/13 1355 02/22/13 0240 02/23/13 0242  WBC 6.6 6.3 8.9  HGB 8.1* 8.5* 9.2*  HCT 26.5* 27.6* 29.2*  PLT 437* 431* 387     CMET  Recent Labs Lab 02/21/13 0540 02/22/13 0240 02/23/13 0242  NA 140 138 141  K 4.3 5.0 4.3  CL 100 98 101  CO2 30 28 32  BUN 7 11 15   CREATININE 0.44* 0.46* 0.53  GLUCOSE 174* 259* 217*  CALCIUM 8.4 8.8 8.7  AST 17  --   --   ALT 15  --   --   ALKPHOS 84  --   --  PROT 5.9*  --   --   ALBUMIN 3.2*  --   --        Recent Labs Lab 02/22/13 0825 02/22/13 1229 02/22/13 1710 02/22/13 2150 02/23/13 0813  GLUCAP 159* 356* 271* 250* 239*    Imaging/Diagnostic Tests: CT head  IMPRESSION:  1. No acute intracranial pathology seen on CT.  2. Mild cortical volume loss noted.  CTA Chest,  1/16 @1817  - no PE, small ground-glass airspace disease RML and LLL, ?infectious vs inflammatory - radiologist recommends f/u noncontrast CT in 3 months - underlying atherosclerosis (left main, 3 vessel CAD) and  COPD  CXR: No acute cardiopulmonary abnormality.  Rosemarie Ax, MD 02/23/2013, 9:30 AM PGY-1, [place] Intern pager: [phone], text pages welcome

## 2013-02-23 NOTE — Progress Notes (Signed)
Seen and examined.  Discussed with Dr. Raeford Razor.  Agree with his documentation and management.  Briefly, I am concerned about Ms. Lober.  She seems to be approaching end stage COPD.  She is O2 dependant and has had a significant decline over the last several months.  She has been tried on multiple inhalors as an outpatient, recevied multiple courses of antibiotics and bursts of prednisone.  Her presentation now is of an acute exacerbation of COPD than an acute pneumonia.  I agree with the current treatment.  My big question is, What can we do to stop this decline?  She may be approaching steroid dependance.  She might benefit from theophylline (but that would make her anxiety worse.).  Since pulm is already seeing, I would ask them to consult while in the hospital.  At a minimum, this will help [place] with our discharge planning.  Speaking of dispo, lives alone with son living next door and being of considerable help.  Given how quickly she develops tachycardia and DOE, she will need considerable improvement for her to resume her independent living.

## 2013-02-23 NOTE — Progress Notes (Signed)
Pt to TX to 6N-21, VSS, called report.

## 2013-02-23 NOTE — Progress Notes (Addendum)
Bilateral carotid artery duplex completed.  Right:  40-59% ICA stenosis.  Left:  60-79% internal carotid artery stenosis.  Bilateral:  Vertebral artery flow is antegrade.

## 2013-02-24 DIAGNOSIS — R0989 Other specified symptoms and signs involving the circulatory and respiratory systems: Secondary | ICD-10-CM

## 2013-02-24 LAB — RESPIRATORY VIRUS PANEL
ADENOVIRUS: NOT DETECTED
Influenza A H1: NOT DETECTED
Influenza A H3: NOT DETECTED
Influenza A: NOT DETECTED
Influenza B: NOT DETECTED
Metapneumovirus: NOT DETECTED
PARAINFLUENZA 3 A: NOT DETECTED
Parainfluenza 1: NOT DETECTED
Parainfluenza 2: NOT DETECTED
Respiratory Syncytial Virus A: NOT DETECTED
Respiratory Syncytial Virus B: NOT DETECTED
Rhinovirus: NOT DETECTED

## 2013-02-24 LAB — GLUCOSE, CAPILLARY
GLUCOSE-CAPILLARY: 330 mg/dL — AB (ref 70–99)
GLUCOSE-CAPILLARY: 144 mg/dL — AB (ref 70–99)
Glucose-Capillary: 166 mg/dL — ABNORMAL HIGH (ref 70–99)
Glucose-Capillary: 338 mg/dL — ABNORMAL HIGH (ref 70–99)

## 2013-02-24 LAB — LEGIONELLA ANTIGEN, URINE: Legionella Antigen, Urine: NEGATIVE

## 2013-02-24 MED ORDER — LEVOFLOXACIN 750 MG PO TABS
750.0000 mg | ORAL_TABLET | Freq: Every day | ORAL | Status: DC
  Administered 2013-02-24 – 2013-02-26 (×3): 750 mg via ORAL
  Filled 2013-02-24 (×3): qty 1

## 2013-02-24 MED ORDER — SALINE SPRAY 0.65 % NA SOLN: 1.0000 | NASAL | Status: DC | PRN

## 2013-02-24 MED ORDER — ASPIRIN EC 81 MG PO TBEC
81.0000 mg | DELAYED_RELEASE_TABLET | Freq: Every day | ORAL | Status: DC
  Administered 2013-02-24 – 2013-02-26 (×3): 81 mg via ORAL
  Filled 2013-02-24 (×3): qty 1

## 2013-02-24 MED ORDER — BUDESONIDE 0.5 MG/2ML IN SUSP
0.5000 mg | Freq: Two times a day (BID) | RESPIRATORY_TRACT | Status: DC
  Administered 2013-02-25 – 2013-02-26 (×3): 0.5 mg via RESPIRATORY_TRACT
  Filled 2013-02-24 (×6): qty 2

## 2013-02-24 MED ORDER — LEVALBUTEROL HCL 0.63 MG/3ML IN NEBU
0.6300 mg | INHALATION_SOLUTION | RESPIRATORY_TRACT | Status: DC | PRN
  Filled 2013-02-24: qty 3

## 2013-02-24 MED ORDER — CLOPIDOGREL BISULFATE 75 MG PO TABS
75.0000 mg | ORAL_TABLET | Freq: Every day | ORAL | Status: DC
  Administered 2013-02-25 – 2013-02-26 (×2): 75 mg via ORAL
  Filled 2013-02-24 (×4): qty 1

## 2013-02-24 MED ORDER — IPRATROPIUM BROMIDE 0.02 % IN SOLN
0.5000 mg | Freq: Four times a day (QID) | RESPIRATORY_TRACT | Status: DC
  Administered 2013-02-24 – 2013-02-26 (×7): 0.5 mg via RESPIRATORY_TRACT
  Filled 2013-02-24 (×7): qty 2.5

## 2013-02-24 MED ORDER — ARFORMOTEROL TARTRATE 15 MCG/2ML IN NEBU
15.0000 ug | INHALATION_SOLUTION | Freq: Two times a day (BID) | RESPIRATORY_TRACT | Status: DC
  Administered 2013-02-24 – 2013-02-26 (×4): 15 ug via RESPIRATORY_TRACT
  Filled 2013-02-24 (×6): qty 2

## 2013-02-24 NOTE — Progress Notes (Addendum)
PULMONARY  / CRITICAL CARE MEDICINE  Name: Judith Boyer MRN: [id number] DOB: 10-Jul-1945 PCP Jenny Reichmann, MD Pulmonary : Dr Melvyn Novas   ADMISSION DATE:  02/20/2013 CONSULTATION DATE:  02/23/13  REFERRING MD : [facility] Dr Andria Frames  CHIEF COMPLAINT:  Short of breath  BRIEF PATIENT DESCRIPTION:  68 yo smoker admitted with progressive dyspnea from AECOPD and PNA.  SIGNIFICANT EVENTS: 1/16 Admit 1/18 Transfuse 1 unit PRBC 1/19 Transfer to floor bed  STUDIES:  1/06 Spirometry >> FEV1 0.6 (33%) 1/16 CT chest >> small focus of GGO with mild BTX in RML, focal GGO LLL, moderate centrilobular emphysema 1/17 Echo >> EF 55 to 60%  LINES / TUBES: PIV  CULTURES: mrsa PRCR 1/16 >>NEG Urine strep 1/16>>neg RESP VIRUS MULTIPLEX PANEL 1/19>> Urine leg 1/19>>  ANTIBIOTICS: vanc 1/16>> 1/20 Zosyn 1/16>> 1/20 Levaquin 1/20 >>  SUBJECTIVE:  Still has cough.  Winded with exertion.  Not much wheezing.  VITAL SIGNS: BP 145/51  Pulse 101  Temp(Src) 98 F (36.7 C) (Oral)  Resp 18  Ht 5\' 1"  (1.549 m)  Wt 106 lb 4.2 oz (48.2 kg)  BMI 20.09 kg/m2  SpO2 98% 2 liters [place]  INTAKE / OUTPUT: I/O last 3 completed shifts: In: 770 [P.O.:120; IV Piggyback:650] Out: 1100 [Urine:1100]   PHYSICAL EXAMINATION: General: no distress  Neuro: alert, normal strength  HEENT: no sinus tenderness Cardiovascular: regular Lungs: kyphotic, decreased breath sounds, no wheeze Abdomen:  Soft, no mass, non tender Musculoskeletal: ankle edema Skin: no rashes  LABS: PULMONARY  Recent Labs Lab 02/20/13 2213  PHART 7.291*  PCO2ART 63.4*  PO2ART 141.0*  HCO3 29.6*  TCO2 31.5  O2SAT 99.7    CBC  Recent Labs Lab 02/21/13 1355 02/22/13 0240 02/23/13 0242  HGB 8.1* 8.5* 9.2*  HCT 26.5* 27.6* 29.2*  WBC 6.6 6.3 8.9  PLT 437* 431* 387    CARDIAC    Recent Labs Lab 02/20/13 2315 02/21/13 0540 02/21/13 0957  TROPONINI <0.30 <0.30 <0.30    Recent Labs Lab 02/20/13 1618  PROBNP  25.3     CHEMISTRY  Recent Labs Lab 02/20/13 1618 02/21/13 0540 02/22/13 0240 02/23/13 0242  NA 138 140 138 141  K 4.3 4.3 5.0 4.3  CL 97 100 98 101  CO2 28 30 28  32  GLUCOSE 221* 174* 259* 217*  BUN 10 7 11 15   CREATININE 0.40* 0.44* 0.46* 0.53  CALCIUM 8.8 8.4 8.8 8.7  MG  --   --   --  2.1  PHOS  --   --   --  4.2   Estimated Creatinine Clearance: 50.8 ml/min (by C-G formula based on Cr of 0.53).   LIVER  Recent Labs Lab 02/21/13 0540  AST 17  ALT 15  ALKPHOS 84  BILITOT <0.2*  PROT 5.9*  ALBUMIN 3.2*    ENDOCRINE CBG (last 3)   Recent Labs  02/23/13 1729 02/23/13 2121 02/24/13 0738  GLUCAP 320* 161* 166*    IMAGING x48h  Ct Head Wo Contrast  02/23/2013   CLINICAL DATA:  Dizziness and weakness.  EXAM: CT HEAD WITHOUT CONTRAST  TECHNIQUE: Contiguous axial images were obtained from the base of the skull through the vertex without intravenous contrast.  COMPARISON:  None.  FINDINGS: There is no evidence of acute infarction, mass lesion, or intra- or extra-axial hemorrhage on CT.  Prominence of the sulci suggests mild cortical volume loss.  The posterior fossa, including the cerebellum, brainstem and fourth ventricle, is within normal limits. The  third and lateral ventricles, and basal ganglia are unremarkable in appearance. The cerebral hemispheres are symmetric in appearance, with normal gray-white differentiation. No mass effect or midline shift is seen.  There is no evidence of fracture; visualized osseous structures are unremarkable in appearance. The orbits are within normal limits. The paranasal sinuses and mastoid air cells are well-aerated. No significant soft tissue abnormalities are seen.  IMPRESSION: 1. No acute intracranial pathology seen on CT. 2. Mild cortical volume loss noted.   Electronically Signed   By: Garald Balding M.D.   On: 02/23/2013 01:32     ASSESSMENT / PLAN:   A: Acute on chronic respiratory failure in setting of PNA (?viral)  and AECOPD. Tobacco abuse. P:   -change to pulmicort, brovana 1/20 -prn albuterol -wean off prednisone as tolerated -Abx per primary team >> currently on levaquin -oxygen to keep SpO2 > 92% >> will need to assess for portable daytime oxygen set up prior to d/c home -will need f/u CT chest in April 2015 to monitor areas of GGO in RML and LLL -f/u respiratory viral panel -not sure what benefit singulair is providing >> will d/c  A: Nasal dryness. P: -discussed with pt proper use of nasal oxygen cannula -add prn nasal irrigation  A: Protein calorie malnutrition. P: -per primary team   Georgann Housekeeper, ACNP [facility] Pulmonology/Critical Care Pager [phone] or [phone]   02/24/2013 8:31 AM  Reviewed above, examined pt, and agree with assessment/plan.  Chesley Mires, MD [facility] Pulmonary/Critical Care 02/24/2013, 2:06 PM Pager:  [phone] After 3pm call: [phone]

## 2013-02-24 NOTE — Progress Notes (Signed)
Inpatient Diabetes Program Recommendations  AACE/ADA: New Consensus Statement on Inpatient Glycemic Control (2013)  Target Ranges:  Prepandial:   less than 140 mg/dL      Peak postprandial:   less than 180 mg/dL (1-2 hours)      Critically ill patients:  140 - 180 mg/dL     Results for Judith Boyer, Judith Boyer (MRN [id number]) as of 02/24/2013 09:11  Ref. Range 02/23/2013 08:13 02/23/2013 12:28 02/23/2013 17:29 02/23/2013 21:21  Glucose-Capillary Latest Range: 70-99 mg/dL 239 (H) 283 (H) 320 (H) 161 (H)    Results for Judith Boyer, Judith Boyer (MRN [id number]) as of 02/24/2013 09:11  Ref. Range 02/24/2013 07:38  Glucose-Capillary Latest Range: 70-99 mg/dL 166 (H)    **Fasting glucose mildly improved today.  Patient had major glucose elevations yesterday with postprandial glucose levels likely secondary to Prednisone.   **MD- Please consider adding Novolog meal coverage while patient on Prednisone- Novolog 6 units tid with meals   Will follow. Wyn Quaker RN, MSN, CDE Diabetes Coordinator Inpatient Diabetes Program Team Pager: [phone] (8a-10p)

## 2013-02-24 NOTE — Progress Notes (Signed)
Seen and examined.  Discussed with Dr. Raeford Razor.  Agree with his management and documentation.  Judith Boyer continues to make slow progress.  For dispo planning, she definitely wants to go home rather than to short term rehab.  We had a frank discussion about the severity of her COPD and the need for goals of care planning.  Her decline over the past 6-12 months has been severe.  The only hope is that she quit smoking 3 weeks ago and that she will have some rebound in her pulmonary status.  Otherwise, I see this as likely approaching end stage.

## 2013-02-24 NOTE — Progress Notes (Signed)
Family Medicine Teaching Service Daily Progress Note Intern Pager: [phone]  Patient name: Judith Boyer Medical record number: [id number] Date of birth: May 03, 1945 Age: 68 y.o. Gender: female  Primary Care Provider: Jenny Reichmann, MD Consultants: none Code Status: full code  Pt Overview and Major Events to Date:  1/16: admitted under [facility]  -CTA no PE but ground-glass airspace disease RML+LLL  -HIV nonreactive, Strep pneumo Ag negative 1/17: transferred to FPTS  -ABG with pH 7.29 (?chronic CO2 retention)  -ECHO: EF 55-60%, mild AV stenosis  -iron panel suggests iron deficiency 1/18: transfused 1 U PRBC's  1/19: transfer out of step down.   Assessment and Plan: 68 year old female history of coronary disease, COPD presents with shortness of breath was found to have community acquired pneumonia.   # SOB secondary to CAP, likely. COPD exacerbation: possible end stage COPD  - Pulmonary rec's: Ck urine legionella, resp virus multiplex PCR panel. Morphine for dyspnea but do this under supervision of PCCM or Palliative. Possible Palliative care c/s  - Palliative care c/s - continue vanc/zosyn (1/17>1/20)  - PO Levaquin 750 mg 1/20  - need to ambulate off O2 prior to discharge, order continuous home O2 if indicated - levalbuterol Q4  - ipratropium Q4  - continue singulair  - symbicort - dc'd 1/20, may exacerbate anxiety with beta agonist action - xopenex nebs prn, mucinex prn for cough - continue prednisone 50mg  daily day 4/5   #Dizziness/recent history of right arm weakness/bruit on exam:  - 1/18 CT: no acute intracranial pathology  - Carotid dopplers: Right 40-59%, Left 60-79%, Vertebral artery flow is antegrade. Mild to moderate ECA stenosis.  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   # Hypertension  - stable, continue home ARB  # Normocytic Anemia:  Due to iron deficiency.  - Hb stable around 8-8.5, but baseline ~11 - CBC   # Weight loss: heme positive stool  - GI: follow up with Dr. Amedeo Plenty as an outpt for colonoscopy   # Diabetes: Hb A1c 9.1 - SSI while hospitalized, hold home metformin  FEN/GI: diabetic diet, SLIV PPx: D/C Lovenox and start SCD's 1/18 given transfusion and FOBT +  Disposition: management as above, discharge planning pending clinical improvement (anticipate 1-2 more days inpt)  Subjective: Patient is feeling better today. She was able to get up and walk around without oxygen but her heart starting beating rapidly and had to replace the O2. Anxiety is much improved compared to yesterday.   Objective: Temp:  [97.9 F (36.6 C)-98.2 F (36.8 C)] 98 F (36.7 C) (01/20 3329) Pulse Rate:  [94-103] 101 (01/20 0632) Resp:  [18-20] 18 (01/20 5188) BP: (122-145)/(51-67) 145/51 mmHg (01/20 0632) SpO2:  [98 %-100 %] 99 % (01/20 4166) Physical Exam: General: elderly female in NAD, [place] in place Cardiovascular: Tachycardic but heart sounds distant Respiratory: diminished air movement throughout, normal work of breathing.  Abdomen: soft, nontender, BS+ Extremities: warm, well-perfused, trace LE edema  Laboratory: CBC  Recent Labs Lab 02/21/13 1355 02/22/13 0240 02/23/13 0242  WBC 6.6 6.3 8.9  HGB 8.1* 8.5* 9.2*  HCT 26.5* 27.6* 29.2*  PLT 437* 431* 387     CMET  Recent Labs Lab  02/21/13 0540 02/22/13 0240 02/23/13 0242  NA 140 138 141  K 4.3 5.0 4.3  CL 100 98 101  CO2 30 28 32  BUN 7 11 15   CREATININE 1.22* 0.46* 0.53  GLUCOSE 174* 259* 217*  CALCIUM 8.4 8.8 8.7  AST 17  --   --   ALT 15  --   --   ALKPHOS 84  --   --   PROT 5.9*  --   --   ALBUMIN 3.2*  --   --        Recent Labs Lab 02/22/13 2150 02/23/13 0813  02/23/13 1228 02/23/13 1729 02/23/13 2121  GLUCAP 250* 239* 283* 320* 161*    Imaging/Diagnostic Tests:  Rosemarie Ax, MD 02/24/2013, 7:55 AM PGY-1, [place] Intern pager: [phone], text pages welcome

## 2013-02-24 NOTE — Progress Notes (Signed)
Palliative Medicine Team consult was received. We will schedule a meeting with patient and her family at the earliest possible time we have a provider available. If there are urgent needs or questions please call [phone]. Thank you for consulting our team to assist with this patients care.  Lane Hacker, DO Palliative Medicine

## 2013-02-25 ENCOUNTER — Inpatient Hospital Stay (HOSPITAL_COMMUNITY): Payer: Medicare Other

## 2013-02-25 DIAGNOSIS — R5381 Other malaise: Secondary | ICD-10-CM

## 2013-02-25 DIAGNOSIS — R5383 Other fatigue: Secondary | ICD-10-CM

## 2013-02-25 DIAGNOSIS — Z515 Encounter for palliative care: Secondary | ICD-10-CM

## 2013-02-25 DIAGNOSIS — R531 Weakness: Secondary | ICD-10-CM

## 2013-02-25 LAB — GLUCOSE, CAPILLARY
GLUCOSE-CAPILLARY: 183 mg/dL — AB (ref 70–99)
GLUCOSE-CAPILLARY: 199 mg/dL — AB (ref 70–99)
GLUCOSE-CAPILLARY: 185 mg/dL — AB (ref 70–99)
GLUCOSE-CAPILLARY: 150 mg/dL — AB (ref 70–99)
Glucose-Capillary: 315 mg/dL — ABNORMAL HIGH (ref 70–99)

## 2013-02-25 MED ORDER — INSULIN ASPART 100 UNIT/ML ~~LOC~~ SOLN
6.0000 [IU] | Freq: Three times a day (TID) | SUBCUTANEOUS | Status: DC
  Administered 2013-02-25 – 2013-02-26 (×3): 6 [IU] via SUBCUTANEOUS

## 2013-02-25 NOTE — Consult Note (Signed)
Patient [name]      DOB: [birth year]      TGG:[id number]     Consult Note from the Palliative Medicine Team at [place] Requested by: Dr Ree Kida     PCP: Jenny Reichmann, MD Reason for Consultation: Clarification of [place] and options    Phone Number:[phone]  Assessment of patients Current state:  Judith Boyer is a 68 y.o. female has a past medical history of Myocardial infarction; COPD (chronic obstructive pulmonary disease); Hypertension; Coronary artery disease; Shortness of breath; and Diabetes mellitus without complication.  Admitted with worsening SOB and found to have CAP Conversation today addressing advanced directives and anticipatory care needs  Consult is for review of medical treatment options, clarification of goals of care and end of life issues, disposition and options, and symptom recommendation.  This NP Wadie Lessen reviewed medical records, received report from team, assessed the patient and then meet at the patient's bedside  to discuss diagnosis prognosis, GOC, EOL wishes disposition and options.   A detailed discussion was had today regarding advanced directives.  Concepts specific to code status, artifical feeding and hydration, continued IV antibiotics and rehospitalization was had.  The difference between a aggressive medical intervention path  and a palliative comfort care path was had.  Values and goals of care important to patient were attempted to be elicited.  Concept of Palliative Care was discussed  Questions and concerns addressed.  Hard Choices booklet left for review. Family encouraged to call with questions or concerns.  PMT will continue to support holistically.      Goals of Care: 1.  Code Status: Full code   2. Scope of Treatment: Presently open to all available and offered medical interventions to prolong life.  4. Disposition:  Home when medically stable, recommend home health services   3. Symptom  Management:   1. Anxiety/Agitation:  Ativan 0.5 mg po bid 2.   Weakness/Fatigue: PT/OT as needed.  Medical management of disease process to maximize functional status    4. Psychosocial: Emotional support offered to patient at bedside. This realization of long term poor prognosis is difficult to process.  Allowed space to express feelings    Patient Documents Completed or Given: Document Given Completed  Advanced Directives Pkt yes   MOST yes   DNR    Gone from My Sight    Hard Choices yes     Brief HPI: Judith Boyer is a 68 y.o. female has a past medical history of Myocardial infarction; COPD (chronic obstructive pulmonary disease); Hypertension; Coronary artery disease; Shortness of breath; and Diabetes mellitus without complication.  Admitted with worsening SOB and found to have CAP Conversation today addressing advanced directives and anticipatory care needs   ROS: weakness, fatigue, sob on exertions    PMH:  Past Medical History  Diagnosis Date  . Myocardial infarction   . COPD (chronic obstructive pulmonary disease)   . Hypertension   . Coronary artery disease   . Shortness of breath   . Diabetes mellitus without complication     type 2     PSH: Past Surgical History  Procedure Laterality Date  . Abdominal hysterectomy    . Shoulder surgery    . Coronary stent placement     I have reviewed the [place] and SH and  If appropriate update it with new information. Allergies  Allergen Reactions  . Doxycycline Anaphylaxis  . Codeine Other (See Comments)    hyperactivity  .  Lisinopril Cough  . Spiriva Handihaler [Tiotropium Bromide Monohydrate]     Rash and Urinary Retention  . Adhesive [Tape] Rash  . Dulera [Mometasone Furo-Formoterol Fum] Rash   Scheduled Meds: . arformoterol  15 mcg Nebulization BID  . aspirin EC  81 mg Oral Daily  . budesonide (PULMICORT) nebulizer solution  0.5 mg Nebulization BID  . clopidogrel  75 mg Oral Q breakfast  .  famotidine  20 mg Oral QHS  . feeding supplement (GLUCERNA SHAKE)  237 mL Oral BID BM  . FLUoxetine  10 mg Oral Daily  . insulin aspart  0-15 Units Subcutaneous TID WC  . insulin aspart  0-5 Units Subcutaneous QHS  . ipratropium  0.5 mg Nebulization Q6H WA  . irbesartan  75 mg Oral Daily  . levofloxacin  750 mg Oral Daily  . LORazepam  0.5 mg Oral BID  . pantoprazole  40 mg Oral QAC breakfast  . simvastatin  40 mg Oral q1800   Continuous Infusions:  PRN Meds:.acetaminophen, dextromethorphan-guaiFENesin, levalbuterol, LORazepam, LORazepam, sodium chloride    BP 129/62  Pulse 103  Temp(Src) 97.5 F (36.4 C) (Oral)  Resp 20  Ht 5\' 1"  (1.549 m)  Wt 48.2 kg (106 lb 4.2 oz)  BMI 20.09 kg/m2  SpO2 99%   PPS: 50%  No intake or output data in the 24 hours ending 02/25/13 1439   Physical Exam:  General: chronically ill appearing, NAD HEENT:  Mm, no exudate Chest:   Diminished air flow, no advantageous BS CVS: tachycardic Abdomen:soft NT +BS Ext: BLE trace edema Neuro:alert and oriented, with decicion making capacity  Labs: CBC    Component Value Date/Time   WBC 8.9 02/23/2013 0242   WBC 12.9* 12/26/2012 1439   RBC 3.44* 02/23/2013 0242   RBC 3.29* 02/21/2013 0540   RBC 4.11 12/26/2012 1439   HGB 9.2* 02/23/2013 0242   HGB 11.6* 12/26/2012 1439   HCT 29.2* 02/23/2013 0242   HCT 38.5 12/26/2012 1439   PLT 387 02/23/2013 0242   MCV 84.9 02/23/2013 0242   MCV 93.7 12/26/2012 1439   MCH 26.7 02/23/2013 0242   MCH 28.2 12/26/2012 1439   MCHC 31.5 02/23/2013 0242   MCHC 30.1* 12/26/2012 1439   RDW 13.8 02/23/2013 0242   LYMPHSABS 2.3 02/20/2013 1618   MONOABS 0.6 02/20/2013 1618   EOSABS 0.1 02/20/2013 1618   BASOSABS 0.0 02/20/2013 1618    BMET    Component Value Date/Time   NA 141 02/23/2013 0242   K 4.3 02/23/2013 0242   CL 101 02/23/2013 0242   CO2 32 02/23/2013 0242   GLUCOSE 217* 02/23/2013 0242   BUN 15 02/23/2013 0242   CREATININE 0.53 02/23/2013 0242   CREATININE  0.46* 12/26/2012 1437   CALCIUM 8.7 02/23/2013 0242   GFRNONAA >90 02/23/2013 0242   GFRAA >90 02/23/2013 0242    CMP     Component Value Date/Time   NA 141 02/23/2013 0242   K 4.3 02/23/2013 0242   CL 101 02/23/2013 0242   CO2 32 02/23/2013 0242   GLUCOSE 217* 02/23/2013 0242   BUN 15 02/23/2013 0242   CREATININE 0.53 02/23/2013 0242   CREATININE 0.46* 12/26/2012 1437   CALCIUM 8.7 02/23/2013 0242   PROT 5.9* 02/21/2013 0540   ALBUMIN 3.2* 02/21/2013 0540   AST 17 02/21/2013 0540   ALT 15 02/21/2013 0540   ALKPHOS 84 02/21/2013 0540   BILITOT <0.2* 02/21/2013 0540   GFRNONAA >90 02/23/2013 0242   GFRAA >90 02/23/2013  5621     Time In Time Out Total Time Spent with Patient Total Overall Time  1330 1500 75 min 90 min    Greater than 50%  of this time was spent counseling and coordinating care related to the above assessment and plan.  Wadie Lessen NP  Palliative Medicine Team Team Phone # [phone] Pager [phone]  Discussed with Dr Andria Frames

## 2013-02-25 NOTE — Progress Notes (Signed)
Family Medicine Teaching Service Daily Progress Note Intern Pager: [phone]  Patient name: Judith Boyer Medical record number: [id number] Date of birth: 10-13-1945 Age: 68 y.o. Gender: female  Primary Care Provider: Jenny Reichmann, MD Consultants: none Code Status: full code  Pt Overview and Major Events to Date:  1/16: admitted under [facility]  -CTA no PE but ground-glass airspace disease RML+LLL  -HIV nonreactive, Strep pneumo Ag negative 1/17: transferred to FPTS  -ABG with pH 7.29 (?chronic CO2 retention)  -ECHO: EF 55-60%, mild AV stenosis  -iron panel suggests iron deficiency 1/18: transfused 1 U PRBC's  1/19: transfer out of step down.   Assessment and Plan: 68 year old female history of coronary disease, COPD presents with shortness of breath was found to have community acquired pneumonia.   # Acute on chronic resp. Failure in setting up PNA and AECOPD:   - Pulmonary rec's: Brovana 1/20, pulmicort 1/20, no benefit of singulair, Albuterol PRN Q2  - Palliative care c/s - PO Levaquin 750 mg 1/20  - need to ambulate off O2 prior to discharge, order continuous home O2 if indicated - ipratropium Q4  - symbicort - dc'd 1/20, may exacerbate anxiety with beta agonist action - xopenex nebs prn, mucinex prn for cough - continue prednisone 50mg  daily day 5/5  - legionella antigen, urine: negative  - Respiratory virus panel: none detected   #Dizziness/recent history of right arm weakness/bruit on exam: medical management at this point.  - 1/18 CT: no acute intracranial pathology  - Carotid dopplers: Right 40-59%, Left 60-79%, Vertebral artery flow is antegrade. Mild to moderate ECA stenosis.  - MRI today   # CAD: resolved  - trop negative x 3 - continue simvastatin,  - holding plavix, aspirin  #Anxeity: Increase in her anxiety, symptoms relating to agoraphobia at baseline    - Started prozac 1/19 - scheduled ativan in the short term.   # Hypertension   - stable, continue home ARB  # Normocytic Anemia:  Due to iron deficiency.  - Hb stable around 8-8.5, but baseline ~11 - CBC   # Weight loss: heme positive stool  - GI: follow up with Dr. Amedeo Plenty as an outpt for colonoscopy   # Diabetes: Hb A1c 9.1 - SSI while hospitalized, hold home metformin  FEN/GI: diabetic diet, SLIV PPx: D/C Lovenox and start SCD's 1/18 given transfusion and FOBT +  Disposition: management as above, discharge planning pending clinical improvement (anticipate 1-2 more days inpt)  Subjective: Patient is much more anxious today. She expresses this after being introduced to several different doctors and saying everyone keeps changing the plan. She is ready to go home. She did had a good day yesterday.   Objective: Temp:  [97.7 F (36.5 C)-98.2 F (36.8 C)] 98.2 F (36.8 C) (01/21 0617) Pulse Rate:  [93-109] 109 (01/21 0617) Resp:  [18-19] 19 (01/21 0617) BP: (133-137)/(51-60) 137/54 mmHg (01/21 0617) SpO2:  [97 %-100 %] 97 % (01/21 0617) Physical Exam: General: elderly female in NAD, [place] in place Cardiovascular: Tachycardic but heart sounds distant Respiratory: diminished air movement throughout, normal work of breathing.  Abdomen: soft, nontender, BS+ Extremities: warm, well-perfused, trace LE edema  Laboratory: CBC  Recent Labs Lab 02/21/13 1355 02/22/13 0240 02/23/13 0242  WBC 6.6 6.3 8.9  HGB 8.1* 8.5* 9.2*  HCT 26.5* 27.6* 29.2*  PLT 437* 431* 387     CMET  Recent Labs Lab 02/21/13 0540 02/22/13 0240 02/23/13 0242  NA 140 138 141  K 4.3  5.0 4.3  CL 100 98 101  CO2 30 28 32  BUN 7 11 15   CREATININE 0.44* 0.46* 0.53  GLUCOSE 174* 259* 217*  CALCIUM 8.4 8.8 8.7  AST 17  --   --   ALT 15  --   --   ALKPHOS 84  --   --   PROT 5.9*  --   --   ALBUMIN 3.2*  --   --        Recent Labs Lab 02/23/13 2121 02/24/13 0738 02/24/13 1230 02/24/13 1651 02/24/13 2159  GLUCAP 161* 166* 338* 330* 144*    Imaging/Diagnostic  Tests:  Rosemarie Ax, MD 02/25/2013, 7:50 AM PGY-1, [place] Intern pager: [phone], text pages welcome

## 2013-02-25 NOTE — Evaluation (Signed)
Physical Therapy Evaluation Patient Details Name: Judith Boyer MRN: [id number] DOB: [birth year] Today's Date: 02/25/2013 Time: 7829-5621 PT Time Calculation (min): 17 min  PT Assessment / Plan / Recommendation History of Present Illness  pt presents with PNA and hx COPD.    Clinical Impression  Pt generally deconditioned and weak.  Pt would benefit from HHPT for home safety.  Will continue to follow.      PT Assessment  Patient needs continued PT services    Follow Up Recommendations  Home health PT;Supervision - Intermittent    Does the patient have the potential to tolerate intense rehabilitation      Barriers to Discharge        Equipment Recommendations   (3YQ)    Recommendations for Other Services     Frequency Min 3X/week    Precautions / Restrictions Precautions Precautions: Fall Restrictions Weight Bearing Restrictions: No   Pertinent Vitals/Pain Denied pain.        Mobility  Transfers Overall transfer level: Needs assistance Equipment used: None Transfers: Sit to/from Stand Sit to Stand: Supervision General transfer comment: pt moves slowly and utilizes UE use.   Ambulation/Gait Ambulation/Gait assistance: Supervision Ambulation Distance (Feet): 150 Feet Assistive device: None Gait Pattern/deviations: Step-through pattern;Decreased stride length General Gait Details: Distance limited by feeling SOB.  pt moves slowly and took one standing rest break.      Exercises     PT Diagnosis: Difficulty walking  PT Problem List: Decreased strength;Decreased activity tolerance;Decreased balance;Decreased mobility;Decreased knowledge of use of DME;Cardiopulmonary status limiting activity PT Treatment Interventions: DME instruction;Gait training;Stair training;Functional mobility training;Therapeutic activities;Therapeutic exercise;Balance training;Patient/family education     PT Goals(Current goals can be found in the care plan section) Acute Rehab PT  Goals Patient Stated Goal: Feel better PT Goal Formulation: With patient Time For Goal Achievement: 03/11/13 Potential to Achieve Goals: Good  Visit Information  Last PT Received On: 02/25/13 Assistance Needed: +1 History of Present Illness: pt presents with PNA and hx COPD.         Prior [place] expects to be discharged to:: Private residence Living Arrangements: Alone Available Help at Discharge: Family;Available PRN/intermittently Type of Home: House Home Access: Stairs to enter [employer] of Steps: 3 Entrance Stairs-Rails: Right Home Layout: One level Home Equipment: Walker - 2 wheels;Shower seat;Tub bench Prior Function Level of Independence: Independent Communication Communication: No difficulties    Cognition  Cognition Arousal/Alertness: Awake/alert Behavior During Therapy: WFL for tasks assessed/performed Overall Cognitive Status: Within Functional Limits for tasks assessed    Extremity/Trunk Assessment Upper Extremity Assessment Upper Extremity Assessment: Generalized weakness Lower Extremity Assessment Lower Extremity Assessment: Generalized weakness   Balance Balance Overall balance assessment: Modified Independent  End of Session PT - End of Session Equipment Utilized During Treatment: Oxygen Activity Tolerance: Patient limited by fatigue Patient left: in chair;with call bell/phone within reach Nurse Communication: Mobility status  GP     [place]in[place]Thornton Papas, [place] 02/25/2013, 3:24 PM

## 2013-02-25 NOTE — Progress Notes (Signed)
Inpatient Diabetes Program Recommendations  AACE/ADA: New Consensus Statement on Inpatient Glycemic Control (2013)  Target Ranges:  Prepandial:   less than 140 mg/dL      Peak postprandial:   less than 180 mg/dL (1-2 hours)      Critically ill patients:  140 - 180 mg/dL   Results for [name], Judith Boyer (MRN [id number]) as of 02/25/2013 12:59  Ref. Range 02/24/2013 07:38 02/24/2013 12:30 02/24/2013 16:51 02/24/2013 21:59 02/25/2013 08:10 02/25/2013 12:31  Glucose-Capillary Latest Range: 70-99 mg/dL 166 (H) 338 (H) 330 (H) 144 (H) 183 (H) 315 (H)    Inpatient Diabetes Program Recommendations Insulin - Meal Coverage: Please consider ordering Novolog 6 units TID with meals for meal coverage to cover elevated post-prandial glucose.  Thanks, Barnie Alderman, RN, MSN, CCRN Diabetes Coordinator Inpatient Diabetes Program [phone] (Team Pager) [phone] (AP office) [phone] [facility] office)

## 2013-02-25 NOTE — Progress Notes (Signed)
Seen and examined.  Discussed with Dr. Raeford Razor and in rounds.  Agree with documentation and management.  Also appreciate palliative care help.  We have made slow progress and I think we will be able to DC home in am.  I believe she would benefit greatly from 24 hour home O2 and portable O2 when out.  We will try to document pulse Ox which would qualify for this order.

## 2013-02-25 NOTE — Progress Notes (Signed)
PULMONARY  / CRITICAL CARE MEDICINE  Name: DELISIA MCQUISTON MRN: [id number] DOB: 04-29-1945 PCP Jenny Reichmann, MD Pulmonary : Dr Melvyn Novas   ADMISSION DATE:  02/20/2013 CONSULTATION DATE:  02/23/13  REFERRING MD : [facility]PT[facility] Dr Andria Frames  CHIEF COMPLAINT:  Short of breath  BRIEF PATIENT DESCRIPTION:  68 yo smoker admitted with progressive dyspnea from AECOPD and PNA.  SIGNIFICANT EVENTS: 1/16 Admit 1/18 Transfuse 1 unit PRBC 1/19 Transfer to floor bed 1/20 Changed to brovana/pulmicort, d/c'ed singulair, off prednisone  STUDIES:  1/06 Spirometry >> FEV1 0.6 (33%) 1/16 CT chest >> small focus of GGO with mild BTX in RML, focal GGO LLL, moderate centrilobular emphysema 1/17 Echo >> EF 55 to 60%  LINES / TUBES: PIV  CULTURES: Urine pneumococcal 1/16>>negative Respiratory viral panel 1/19>> negative Urine legionella 1/19>> negative  ANTIBIOTICS: vanc 1/16>> 1/20 Zosyn 1/16>> 1/20 Levaquin 1/20 >>  SUBJECTIVE:  Breathing has been up and down, but she thinks her breathing was better yesterday after nebulizer change.  VITAL SIGNS: BP 137/54  Pulse 109  Temp(Src) 98.2 F (36.8 C) (Oral)  Resp 19  Ht 5\' 1"  (1.549 m)  Wt 106 lb 4.2 oz (48.2 kg)  BMI 20.09 kg/m2  SpO2 97% 2 liters [place]  INTAKE / OUTPUT: I/O last 3 completed shifts: In: 590 [P.O.:240; IV Piggyback:350] Out: 750 [Urine:750]   PHYSICAL EXAMINATION: General: no distress  Neuro: alert, normal strength  HEENT: no sinus tenderness Cardiovascular: regular Lungs: kyphotic, decreased breath sounds, no wheeze Abdomen:  Soft, no mass, non tender Musculoskeletal: ankle edema Skin: no rashes  LABS: PULMONARY  Recent Labs Lab 02/20/13 2213  PHART 7.291*  PCO2ART 63.4*  PO2ART 141.0*  HCO3 29.6*  TCO2 31.5  O2SAT 99.7    CBC  Recent Labs Lab 02/21/13 1355 02/22/13 0240 02/23/13 0242  HGB 8.1* 8.5* 9.2*  HCT 26.5* 27.6* 29.2*  WBC 6.6 6.3 8.9  PLT 437* 431* 387    CARDIAC    Recent  Labs Lab 02/20/13 2315 02/21/13 0540 02/21/13 0957  TROPONINI <0.30 <0.30 <0.30    Recent Labs Lab 02/20/13 1618  PROBNP 25.3     CHEMISTRY  Recent Labs Lab 02/20/13 1618 02/21/13 0540 02/22/13 0240 02/23/13 0242  NA 138 140 138 141  K 4.3 4.3 5.0 4.3  CL 97 100 98 101  CO2 28 30 28  32  GLUCOSE 221* 174* 259* 217*  BUN 10 7 11 15   CREATININE 0.40* 0.44* 0.46* 0.53  CALCIUM 8.8 8.4 8.8 8.7  MG  --   --   --  2.1  PHOS  --   --   --  4.2   Estimated Creatinine Clearance: 50.8 ml/min (by C-G formula based on Cr of 0.53).   LIVER  Recent Labs Lab 02/21/13 0540  AST 17  ALT 15  ALKPHOS 84  BILITOT <0.2*  PROT 5.9*  ALBUMIN 3.2*    ENDOCRINE CBG (last 3)   Recent Labs  02/24/13 1651 02/24/13 2159 02/25/13 0810  GLUCAP 330* 144* 183*    IMAGING x48h  No results found.   ASSESSMENT / PLAN:   A: Acute on chronic respiratory failure in setting of PNA (?viral) and AECOPD. She reports urine retention from spiriva, and rash from dulera. Tobacco abuse. P:   -changed to pulmicort, brovana 1/20 -prn albuterol -Abx per primary team >> currently on levaquin -oxygen to keep SpO2 > 92% >> will need to assess for portable daytime oxygen set up prior to d/c home -will need  f/u CT chest in April 2015 to monitor areas of GGO in RML and LLL, depending on goals of care  Pulmonary regimen stable for d/c home.  She is to have family conference with palliative care.  PCCM will f/u while she is in hospital.  She also has f/u with Dr. Melvyn Novas in pulmonary office scheduled for 03/10/13.  Chesley Mires, MD [facility] Pulmonary/Critical Care 02/25/2013, 10:01 AM Pager:  [phone] After 3pm call: [phone]

## 2013-02-26 ENCOUNTER — Telehealth: Payer: Self-pay

## 2013-02-26 DIAGNOSIS — F411 Generalized anxiety disorder: Secondary | ICD-10-CM

## 2013-02-26 LAB — GLUCOSE, CAPILLARY
GLUCOSE-CAPILLARY: 127 mg/dL — AB (ref 70–99)
Glucose-Capillary: 144 mg/dL — ABNORMAL HIGH (ref 70–99)

## 2013-02-26 MED ORDER — LEVOFLOXACIN 750 MG PO TABS: 750.0000 mg | ORAL_TABLET | Freq: Every day | ORAL | Status: DC

## 2013-02-26 NOTE — Telephone Encounter (Signed)
Patient called wanting Dr. Everlene Farrier and/or Windell Hummingbird to call her as soon as possible regarding medications she will need. Patient was recently released from the hospital, and needs medication from Dr. Everlene Farrier. Please call patient at [phone] or [phone].   Thank You!

## 2013-02-26 NOTE — Progress Notes (Signed)
Pt.  o2 sats 96 on RA.  Pt. Was ambulated around the hallways of hospital 2-3 times.  Pt. 02 sats was 96-97% while ambulating without any o2.    Nursing will continue to follow.

## 2013-02-26 NOTE — Progress Notes (Signed)
NUTRITION FOLLOW UP  Intervention:   Continue Glucerna Shakes BID Encourage ongoing PO intake  Nutrition Dx:   Inadequate oral intake related to poor appetite as evidenced by pt report; resolved  Goal:   Pt to consume >90% of meals/supplements; being met per pt report  Monitor:   Weights, labs, intake  Assessment:   Pt with history of MI, COPD, HTN, CAD, DM, and shortness of breath. Presented with 4-5 of worsening shortness of breath. Found to have community acquired pneumonia with a contribution of COPD.   Pt states that her appetite is much better and she is eating well; she reports eating 3 meals daily and meal completion >75%. Pt also states she has been drinking Glucerna Shakes twice daily. Encouraged pt to continue eating well and to continue use of Glucerna shakes as needed.   Blood glucose ranging 127 to 315 mg/dL  Height: Ht Readings from Last 1 Encounters:  02/20/13 5' 1" (1.549 m)    Weight Status:   Wt Readings from Last 1 Encounters:  02/20/13 106 lb 4.2 oz (48.2 kg)    Re-estimated needs:  Kcal: 1200-1400  Protein: >/= 72 grams  Fluid: 1.2-1.4L/day   Skin: non-pitting RLE and LLE edema  Diet Order: Carb Control  No intake or output data in the 24 hours ending 02/26/13 1403  Last BM: 1/21   Labs:   Recent Labs Lab 02/21/13 0540 02/22/13 0240 02/23/13 0242  NA 140 138 141  K 4.3 5.0 4.3  CL 100 98 101  CO2 30 28 32  BUN 7 11 15  CREATININE 0.44* 0.46* 0.53  CALCIUM 8.4 8.8 8.7  MG  --   --  2.1  PHOS  --   --  4.2  GLUCOSE 174* 259* 217*    CBG (last 3)   Recent Labs  02/25/13 2142 02/26/13 0815 02/26/13 1205  GLUCAP 150* 144* 127*    Scheduled Meds: . arformoterol  15 mcg Nebulization BID  . budesonide (PULMICORT) nebulizer solution  0.5 mg Nebulization BID  . clopidogrel  75 mg Oral Q breakfast  . famotidine  20 mg Oral QHS  . feeding supplement (GLUCERNA SHAKE)  237 mL Oral BID BM  . FLUoxetine  10 mg Oral Daily  .  insulin aspart  0-15 Units Subcutaneous TID WC  . insulin aspart  0-5 Units Subcutaneous QHS  . insulin aspart  6 Units Subcutaneous TID WC  . ipratropium  0.5 mg Nebulization Q6H WA  . irbesartan  75 mg Oral Daily  . levofloxacin  750 mg Oral Daily  . LORazepam  0.5 mg Oral BID  . pantoprazole  40 mg Oral QAC breakfast  . simvastatin  40 mg Oral q1800    Continuous Infusions:    Barnett RD, LDN Inpatient Clinical Dietitian Pager: [phone] After Hours Pager: [phone]  

## 2013-02-26 NOTE — Progress Notes (Signed)
Seen and examined.  Discussed with Dr. Bonner Puna and in rounds.  Agree with his management and documentation.  Briefly, Ms Montesano is ready for DC.  She did not qualify for portable O2 and is anxious about this.  She has not had a CVA, so we can send her home just on plavix (no ASA), which will hopefully help her GI blood loss anemia.  Her COPD seems quite severe.  My big hope is that she will see some significant improvement over the next month as she obtains the benefits from quitting smoking 3 weeks ago.  The future is uncertain for her - which worsens her anxiety.  Still, she has achieved max benefit from this hospitalization.

## 2013-02-26 NOTE — Discharge Instructions (Signed)
You are being discharged with home health physical therapy services. You will not be sent with oxygen at home as you were not hypoxic on ambulation. Your shortness of breath is expected to continue to improve. You need to continue to take an antibiotic, levaquin for 4 more days. Please take the entire course.   You will follow up with the pulmonologist on 2/3 and you need to call Dr. Everlene Farrier for a follow up appointment. You will continue advair and the rest of your home medications until assessment with the pulmonologist.   If you experience fever, chills, chest pain, or worsening shortness of breath, make sure you seek medical attention before your appointments.

## 2013-02-26 NOTE — Progress Notes (Signed)
PULMONARY  / CRITICAL CARE MEDICINE  Name: Judith WARMAN MRN: [id number] DOB: [birth year] PCP Jenny Reichmann, MD Pulmonary : Dr Melvyn Novas   ADMISSION DATE:  02/20/2013 CONSULTATION DATE:  02/23/13  REFERRING MD : [facility] Dr Andria Frames  CHIEF COMPLAINT:  Short of breath  BRIEF PATIENT DESCRIPTION:  68 yo smoker admitted with progressive dyspnea from AECOPD and PNA.  SIGNIFICANT EVENTS: 1/16 Admit 1/18 Transfuse 1 unit PRBC 1/19 Transfer to floor bed 1/20 Changed to brovana/pulmicort, d/c'ed singulair, off prednisone 1/21 Palliative care consulted >> remain full code for now  STUDIES:  1/06 Spirometry >> FEV1 0.6 (33%) 1/16 CT chest >> small focus of GGO with mild BTX in RML, focal GGO LLL, moderate centrilobular emphysema 1/17 Echo >> EF 55 to 60% 1/21 MRI Brain >> mild generalized atrophy  LINES / TUBES: PIV  CULTURES: Urine pneumococcal 1/16>>negative Respiratory viral panel 1/19>> negative Urine legionella 1/19>> negative  ANTIBIOTICS: vanc 1/16>> 1/20 Zosyn 1/16>> 1/20 Levaquin 1/20 >>  SUBJECTIVE:  Feels much better.  Anxious to go home.  VITAL SIGNS: BP 138/51  Pulse 95  Temp(Src) 98.4 F (36.9 C) (Oral)  Resp 16  Ht 5\' 1"  (1.549 m)  Wt 106 lb 4.2 oz (48.2 kg)  BMI 20.09 kg/m2  SpO2 95% 2 liters [place]  PHYSICAL EXAMINATION: General: no distress  Neuro: alert, normal strength  HEENT: no sinus tenderness Cardiovascular: regular Lungs: kyphotic, decreased breath sounds, no wheeze Abdomen:  Soft, no mass, non tender Musculoskeletal: ankle edema Skin: no rashes  LABS: PULMONARY  Recent Labs Lab 02/20/13 2213  PHART 7.291*  PCO2ART 63.4*  PO2ART 141.0*  HCO3 29.6*  TCO2 31.5  O2SAT 99.7    CBC  Recent Labs Lab 02/21/13 1355 02/22/13 0240 02/23/13 0242  HGB 8.1* 8.5* 9.2*  HCT 26.5* 27.6* 29.2*  WBC 6.6 6.3 8.9  PLT 437* 431* 387    CARDIAC    Recent Labs Lab 02/20/13 2315 02/21/13 0540 02/21/13 0957  TROPONINI <0.30 <0.30  <0.30    Recent Labs Lab 02/20/13 1618  PROBNP 25.3     CHEMISTRY  Recent Labs Lab 02/20/13 1618 02/21/13 0540 02/22/13 0240 02/23/13 0242  NA 138 140 138 141  K 4.3 4.3 5.0 4.3  CL 97 100 98 101  CO2 28 30 28  32  GLUCOSE 221* 174* 259* 217*  BUN 10 7 11 15   CREATININE 0.40* 0.44* 0.46* 0.53  CALCIUM 8.8 8.4 8.8 8.7  MG  --   --   --  2.1  PHOS  --   --   --  4.2   Estimated Creatinine Clearance: 50.8 ml/min (by C-G formula based on Cr of 0.53).   LIVER  Recent Labs Lab 02/21/13 0540  AST 17  ALT 15  ALKPHOS 84  BILITOT <0.2*  PROT 5.9*  ALBUMIN 3.2*    ENDOCRINE CBG (last 3)   Recent Labs  02/25/13 1846 02/25/13 2142 02/26/13 0815  GLUCAP 185* 150* 144*    IMAGING x48h  Mr Brain Wo Contrast  02/25/2013   CLINICAL DATA:  Evaluate for stroke  EXAM: MRI HEAD WITHOUT CONTRAST  TECHNIQUE: Multiplanar, multiecho pulse sequences of the brain and surrounding structures were obtained without intravenous contrast.  COMPARISON:  Prior CT from 02/22/2013  FINDINGS: Mild diffuse prominence of the CSF containing spaces is compatible with generalized age-related atrophy. A few scattered T2/FLAIR hyperintense foci are seen within the periventricular and deep white matter, nonspecific, but likely related to mild chronic microvascular ischemic changes. No  mass lesion or midline shift. Ventricles are normal in size without evidence of hydrocephalus. No extra-axial fluid collection.  No diffusion-weighted signal abnormality is identified to suggest acute intracranial infarct. There is no acute intracranial hemorrhage. Normal flow voids are seen within the intracranial vasculature. Gray-white matter differentiation is maintained. Probable DVA noted within the anteromedial right frontal lobe. No associated signal changes are seen.  Cervicomedullary junction is normal. Pituitary gland demonstrates a normal appearance with normal signal intensity. The globes and optic nerves are  within normal limits.  Calvarium demonstrates a normal appearance with normal signal intensity. The visualized upper cervical spine is normal.  Paranasal sinuses are clear.  No mastoid effusion.  IMPRESSION: 1. No acute intracranial infarct or other abnormality identified. 2. Mild generalized cerebral atrophy.   Electronically Signed   By: Jeannine Boga M.D.   On: 02/25/2013 22:06     ASSESSMENT / PLAN:   A: Acute on chronic respiratory failure in setting of PNA (?viral) and AECOPD >> much improved. She reports urine retention from spiriva, and rash from dulera. Tobacco abuse. P:   -changed to pulmicort, brovana 1/20 >> can resume advair as outpt -continue prn albuterol/ipratropium as outpt -Abx per primary team >> currently on levaquin -oxygen to keep SpO2 > 92% >> will need to assess for portable daytime oxygen set up prior to d/c home -will need f/u CT chest in April 2015 to monitor areas of GGO in RML and LLL, depending on goals of care  Pulmonary regimen stable for d/c home.  She has f/u with Dr. Melvyn Novas in pulmonary office scheduled for 03/10/13.  Can then assess whether she is stable on advair, or if she should be switched to brovana/pulmicort nebulizer regimen as outpt >> main concern is related to finances and expense of her medications.  Would not resume singulair for now, and can re-assess this as outpt.  Chesley Mires, MD [facility] Pulmonary/Critical Care 02/26/2013, 9:32 AM Pager:  [phone] After 3pm call: [phone]

## 2013-02-26 NOTE — Progress Notes (Signed)
Discharge instructions given and explained to pt.. Pt. Verbalized understanding.  Prescriptions called in to pt.'s pharmacy.  IV removed.  Pt. Discharged to home with family member. Syliva Overman

## 2013-02-26 NOTE — Telephone Encounter (Signed)
Pt called and stated that she was told at the hospital that she should be on Prozac and Ativan but when she went to the pharmacy and it was not sent in.  She wanted to know if you would need to see her or could you just sent it in.  I advised her that she should follow up her but she would like to hear from you first.

## 2013-02-26 NOTE — Progress Notes (Signed)
Family Medicine Teaching Service Daily Progress Note Intern Pager: [phone]  Patient name: Judith Boyer Medical record number: [id number] Date of birth: 1945/02/20 Age: 68 y.o. Gender: female  Primary Care Provider: Jenny Reichmann, MD Consultants: none Code Status: full code  Pt Overview and Major Events to Date:  1/16: admitted under [facility]  -CTA no PE but ground-glass airspace disease RML+LLL  -HIV nonreactive, Strep pneumo Ag negative 1/17: transferred to [facility]  -ABG with pH 7.29 (?chronic CO2 retention)  -ECHO: EF 55-60%, mild AV stenosis  -iron panel suggests iron deficiency 1/18: transfused 1 U PRBC's  1/19: transfer out of step down.   Assessment and Plan: 68 year old female history of coronary disease, COPD presents with shortness of breath was found to have community acquired pneumonia.   # Acute on chronic resp. Failure in setting up PNA and AECOPD:   - Pulmonary rec's: Brovana 1/20, pulmicort 1/20, no benefit of singulair, Albuterol PRN Q2  - Continuing levaquin po as outpatient treatment - need to ambulate off O2 prior to discharge, pt not hypoxic during ambulation yesterday PM  - symbicort - dc'd 1/20, may exacerbate anxiety with beta agonist action - xopenex nebs prn, mucinex prn for cough - s/p 5 days prednisone - legionella antigen, urine: negative  - Respiratory virus panel: none detected   #Dizziness/recent history of right arm weakness/bruit on exam: medical management at this point.  - 1/18 CT: no acute intracranial pathology  - Carotid dopplers: Right 40-59%, Left 60-79%, Vertebral artery flow is antegrade. Mild to moderate ECA stenosis.  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   # Hypertension  - stable, continue home  ARB  # Normocytic Anemia:  Due to iron deficiency.  - Hb stable around 8-8.5, but baseline ~11 - CBC   # Weight loss: heme positive stool  - GI: follow up with Dr. Amedeo Plenty as an outpt for colonoscopy   # Diabetes: Hb A1c 9.1 - SSI while hospitalized, hold home metformin  FEN/GI: diabetic diet, SLIV PPx: D/C Lovenox and start SCD's 1/18 given transfusion and FOBT +  Disposition: D/C home today with home health PT  Subjective: Patient is much improved and anxious to go home. No events overnight. No chest pain.   Objective: Temp:  [97.5 F (36.4 C)-98.4 F (36.9 C)] 98.4 F (36.9 C) (01/22 0511) Pulse Rate:  [95-103] 95 (01/22 0511) Resp:  [16-20] 16 (01/22 0511) BP: (129-158)/(51-62) 138/51 mmHg (01/22 0511) SpO2:  [95 %-99 %] 95 % (01/22 1478) Physical Exam: General: elderly female in NAD Cardiovascular: Tachycardic but heart sounds distant Respiratory: Nonlabored, CTAB, diminished sounds Abdomen: soft, nontender, BS+ Extremities: warm, well-perfused, trace LE edema  Laboratory: CBC  Recent Labs Lab 02/21/13 1355 02/22/13 0240 02/23/13 0242  WBC 6.6 6.3 8.9  HGB 8.1* 8.5* 9.2*  HCT 26.5* 27.6* 29.2*  PLT 437* 431* 387     CMET  Recent Labs Lab 02/21/13 0540 02/22/13 0240 02/23/13 0242  NA 140 138 141  K 4.3 5.0 4.3  CL 100 98 101  CO2 30 28 32  BUN 7 11 15   CREATININE 0.44* 0.46* 0.53  GLUCOSE 174* 259* 217*  CALCIUM 8.4 8.8 8.7  AST 17  --   --   ALT 15  --   --  ALKPHOS 84  --   --   PROT 5.9*  --   --   ALBUMIN 3.2*  --   --        Recent Labs Lab 02/25/13 1231 02/25/13 1715 02/25/13 1846 02/25/13 2142 02/26/13 0815  GLUCAP 315* 199* 185* 150* 144*    Imaging/Diagnostic Tests: MRI HEAD WITHOUT CONTRAST  FINDINGS:  Mild diffuse prominence of the CSF containing spaces is compatible  with generalized age-related atrophy. A few scattered T2/FLAIR  hyperintense foci are seen within the periventricular and deep white  matter,  nonspecific, but likely related to mild chronic  microvascular ischemic changes. No mass lesion or midline shift.  Ventricles are normal in size without evidence of hydrocephalus. No  extra-axial fluid collection.  No diffusion-weighted signal abnormality is identified to suggest  acute intracranial infarct. There is no acute intracranial  hemorrhage. Normal flow voids are seen within the intracranial  vasculature. Gray-white matter differentiation is maintained.  Probable DVA noted within the anteromedial right frontal lobe. No  associated signal changes are seen.  Cervicomedullary junction is normal. Pituitary gland demonstrates a  normal appearance with normal signal intensity. The globes and optic  nerves are within normal limits.  Calvarium demonstrates a normal appearance with normal signal  intensity. The visualized upper cervical spine is normal.  Paranasal sinuses are clear. No mastoid effusion.  IMPRESSION:  1. No acute intracranial infarct or other abnormality identified.  2. Mild generalized cerebral atrophy.  Vance Gather, MD 02/26/2013, 9:34 AM PGY-1, [name] pager: [phone], text pages welcome

## 2013-02-26 NOTE — Discharge Summary (Signed)
[facility] Discharge Summary  Patient name: Judith Boyer Medical record number: [id number] Date of birth: [birth year] Age: 68 y.o. Gender: female Date of Admission: 02/20/2013  Date of Discharge: 02/26/13 Admitting Physician: Toy Baker, MD  Primary Care Provider: Jenny Reichmann, MD Consultants: GI, Palliative, Pulmonology   Indication for Hospitalization: Shortness of breath   Discharge Diagnoses/Problem List:  Patient Active Problem List   Diagnosis Date Noted  . Palliative care encounter 02/25/2013  . Weakness generalized 02/25/2013  . COPD exacerbation 02/20/2013  . CAP (community acquired pneumonia) 02/20/2013  . Anemia 02/20/2013  . Nocturnal hypoxemia 12/30/2012  . Chest pain at rest 02/09/2011  . CAD in native artery 02/09/2011  . COPD GOLD III 02/06/2011  . Smoker 02/06/2011  . Hypertension 02/06/2011  . Diabetes mellitus type 2, controlled, without complications 14/43/1540   Disposition: home   Discharge Condition: improved   Discharge Exam:  General: elderly female in NAD  Cardiovascular: Tachycardic but heart sounds distant  Respiratory: Nonlabored, CTAB, diminished sounds  Abdomen: soft, nontender, BS+  Extremities: warm, well-perfused, trace LE edema  Brief Hospital Course:  68 year old female history of coronary disease, COPD presents with shortness of breath was found to have community acquired pneumonia.   # Acute on chronic resp. Failure in setting up PNA and AECOPD: She presented with 4-5 days of worsening shortness of breath. She had recently quit smoking and uses oxygen at night.  CT in the ED was negative for PE and showed no consolidation and suspected more of a COPD exacerbation.  She was started on Vanc and Zosyn and sent to step down due to her level of sleepiness on initial exam. ABG did show respiratory acidosis but thought sleepiness was due to ativan use in the ED.  Her home symbicort and Singulair were  continued. She was started on prednisone 50 mg on 1/18 and completed a 5 day course. Legionella antigen, urine legionella antigen and respiratory virus panel were all negative. HIV  And strep pneumonia antigen were negative.   Pulmonary was consulted and started brovana and pulmicort and discontinued singulair stating it offered no benefit.  She was transitioned to Levaquin on 1/20 to compete a 10 day course.  A Palliative consult to achieve goals of care was completed.  This was undertaken due to her having 5 COPD exacerbations within the recent time frame.   She was open to all available medicals interventions to prolong life and maintaining a full code.  She was discharged on advair instead of pulmicort and brovana.    #Dizziness/recent history of right arm weakness/bruit on exam/TIA 2 weeks prior to arrival: An ECHO showed EF 55-60% and mild aortic stenosis (no thrombus), with new carotid bruit on the right side.  She was continued on plavix, ASA and statin. A CT head showed no acute intracranial abnormality. Carotid dopplers revealed right 40-59% and left 60-79% and vertebral artery flow is antegrade and mild to moderate ECA stenosis.  An MRI was negative for stroke. Due to these findings and GI blood loss anemia, ASA was discontinued and plavix was continued on discharge.   # CAD: Cardiac work up was negative, CTA negative for PE, with chest pain most likely related to the increased work of breathing.   #Anxeity: Increase in her anxiety, symptoms relating to agoraphobia at baseline.  She was started on Prozac and scheduled ativan on 1/19.  The ativan was started to act as a bridge until the Prozac started working.  Her  anxiety improved prior to discharge. These were to continue on discharge.    # Hypertension: Stable and continued on her home medications.    # Normocytic Anemia: Iron panel revealed to have low iron. She was transfused 1 U PRBC's. Her Hgb was trending up toward baseline (Hgb ~11) by  time of discharge.     # Weight loss: Unclear in etiology but found to have heme positive stool.  She has a history of colon polyps and overdue for surveillance.  She will need to follow up with Dr. Amedeo Plenty as an outpt for colonoscopy   # Diabetes: Hb A1c 9.1. She was given a SSI and her home metformin was held. Continued home medications on discharge.   Issues for Follow Up:  - Discharged on advair due to financial considerations and to complete 10-day course of antibiotics with levaquin.  - Will receive home health physical therapy. Ambulatory monitoring of oxygen saturation did not qualify her for home oxygen therapy.  - MRI negative for CVA, discharged on plavix alone, discontinued aspirin.  - Follow-up noncontrast chest CT in 3 months is recommended to ensure resolution of these findings, as the lesion in the lateral segment of the right middle lobe could alternatively (less likely) represent a low-grade neoplasm such is in adenocarcinoma. - Found to have heme positive stool. Dr. Amedeo Plenty follow up as outpatient for colonoscopy  - started on Prozac and scheduled ativan to bridge until the Prozac started working given her level of anxiety.   Significant Procedures: MRI   Significant Labs and Imaging:   Recent Labs Lab 02/21/13 1355 02/22/13 0240 02/23/13 0242  WBC 6.6 6.3 8.9  HGB 8.1* 8.5* 9.2*  HCT 26.5* 27.6* 29.2*  PLT 437* 431* 387    Recent Labs Lab 02/20/13 1618 02/21/13 0540 02/22/13 0240 02/23/13 0242  NA 138 140 138 141  K 4.3 4.3 5.0 4.3  CL 97 100 98 101  CO2 28 30 28  32  GLUCOSE 221* 174* 259* 217*  BUN 10 7 11 15   CREATININE 0.40* 0.44* 0.46* 0.53  CALCIUM 8.8 8.4 8.8 8.7  MG  --   --   --  2.1  PHOS  --   --   --  4.2  ALKPHOS  --  84  --   --   AST  --  17  --   --   ALT  --  15  --   --   ALBUMIN  --  3.2*  --   --    ABG    Component Value Date/Time   PHART 7.291* 02/20/2013 2213   PCO2ART 63.4* 02/20/2013 2213   PO2ART 141.0* 02/20/2013 2213    HCO3 29.6* 02/20/2013 2213   TCO2 31.5 02/20/2013 2213   ACIDBASEDEF 1.0 02/04/2011 1735   O2SAT 99.7 02/20/2013 2213   Iron/TIBC/Ferritin    Component Value Date/Time   IRON 13* 02/21/2013 0540   TIBC 402 02/21/2013 0540   FERRITIN 9* 02/21/2013 0540   1/16: ECG REPORT  Rate: 121  Rhythm: ST  ST&T Change: no ischemic changes  MRI HEAD WITHOUT CONTRAST  IMPRESSION:  1. No acute intracranial infarct or other abnormality identified.  2. Mild generalized cerebral atrophy.  CT chest: no evidence of PE. Findings suggestive of COPD. Small focus of ground glass attenuation airspace disease in the lateral segment of the right middle lobe, and foci of ground-glass attenuation micronodularity in a peribronchovascular distribution in the left lower lobe, presumably infectious or inflammatory.  CXR:  No acute cardiopulmonary abnormality.  Carotid dopplers: Right 40-59%, Left 60-79%, Vertebral artery flow is antegrade. Mild to moderate ECA stenosis.  1/18 CT head: no acute intracranial pathology   Results/Tests Pending at Time of Discharge: none  Discharge Medications:    Medication List    STOP taking these medications       aspirin 325 MG EC tablet     montelukast 10 MG tablet  Commonly known as:  SINGULAIR      TAKE these medications       albuterol 108 (90 BASE) MCG/ACT inhaler  Commonly known as:  PROAIR HFA  Inhale 2 puffs into the lungs every 4 (four) hours as needed for wheezing or shortness of breath.     albuterol (2.5 MG/3ML) 0.083% nebulizer solution  Commonly known as:  PROVENTIL  Take 3 mLs (2.5 mg total) by nebulization every 4 (four) hours as needed for wheezing.     clopidogrel 75 MG tablet  Commonly known as:  PLAVIX  Take 1 tablet (75 mg total) by mouth every morning.     dextromethorphan-guaiFENesin 30-600 MG per 12 hr tablet  Commonly known as:  MUCINEX DM  Take 1 tablet by mouth every 12 (twelve) hours as needed. For cough     famotidine 20 MG tablet   Commonly known as:  PEPCID  One at bedtime     Fluticasone-Salmeterol 500-50 MCG/DOSE Aepb  Commonly known as:  ADVAIR  Inhale 1 puff into the lungs 2 (two) times daily.     FLUTTER Devi  Use as directed     ipratropium-albuterol 0.5-2.5 (3) MG/3ML Soln  Commonly known as:  DUONEB  Take 3 mLs by nebulization 3 (three) times daily. Take 1 vial at 9:00AM, 3:00pm and 10:00pm     levofloxacin 750 MG tablet  Commonly known as:  LEVAQUIN  Take 1 tablet (750 mg total) by mouth daily.     metFORMIN 1000 MG tablet  Commonly known as:  GLUCOPHAGE  Take 1,000 mg by mouth 2 (two) times daily. Take 1 tablet in the morning and 1 tablet in the evening     nitroGLYCERIN 0.4 MG SL tablet  Commonly known as:  NITROSTAT  Place 1 tablet (0.4 mg total) under the tongue every 5 (five) minutes x 3 doses as needed for chest pain.     pantoprazole 40 MG tablet  Commonly known as:  PROTONIX  Take 1 tablet (40 mg total) by mouth daily. Take 30-60 min before first meal of the day     pravastatin 80 MG tablet  Commonly known as:  PRAVACHOL  Take 1 tablet (80 mg total) by mouth every evening.     valsartan 80 MG tablet  Commonly known as:  DIOVAN  Take 1 tablet (80 mg total) by mouth daily.        Discharge Instructions: Please refer to Patient Instructions section of EMR for full details.  Patient was counseled important signs and symptoms that should prompt return to medical care, changes in medications, dietary instructions, activity restrictions, and follow up appointments.   Follow-Up Appointments: Follow-up Information   Follow up with HAYES,JOHN C, MD In 3 weeks.   Specialty:  Gastroenterology   Contact information:   [address], [place] [address] [phone]       Follow up with Christinia Gully, MD On 03/10/2013.   Specialty:  Pulmonary Disease   Contact information:   [address] [place] [address] [phone]  Schedule an appointment as soon as  possible for a visit with Jenny Reichmann, MD.   Specialty:  Family Medicine   Contact information:   [place] [id number]       Rosemarie Ax, MD 02/27/2013, 12:05 AM PGY-1, [place]

## 2013-02-26 NOTE — Progress Notes (Signed)
Given neg MRI of brain (no CVA) and known GI bleed, I would DC on just plavix rather than the combination of ASA and plavix

## 2013-02-27 MED ORDER — FLUOXETINE HCL 20 MG PO TABS: 10.0000 mg | ORAL_TABLET | Freq: Every day | ORAL | Status: DC

## 2013-02-27 MED ORDER — LORAZEPAM 0.5 MG PO TABS: 0.5000 mg | ORAL_TABLET | Freq: Two times a day (BID) | ORAL | Status: DC | PRN

## 2013-02-27 NOTE — Telephone Encounter (Signed)
It is fine for her to be on Prozac and Ativan. Please pull the records and see what dosage she is on and we can send it to the pharmacy.

## 2013-02-27 NOTE — Discharge Summary (Signed)
Seen and examined on the day of DC.  Agree with DC as outlined by Dr. Raeford Razor

## 2013-02-27 NOTE — Telephone Encounter (Signed)
Pt is on Prozac 10mg  QD, Ativan 0.5mg  BID   Confirmed with pt- advised I would fax them.  Rx's sent to [facility] on [address]

## 2013-03-10 ENCOUNTER — Encounter: Payer: Self-pay | Admitting: Internal Medicine

## 2013-03-10 ENCOUNTER — Ambulatory Visit (INDEPENDENT_AMBULATORY_CARE_PROVIDER_SITE_OTHER): Payer: Medicare Other | Admitting: Internal Medicine

## 2013-03-10 VITALS — BP 122/70 | HR 110 | Temp 98.0°F | Ht 61.0 in | Wt 107.2 lb

## 2013-03-10 DIAGNOSIS — R0902 Hypoxemia: Secondary | ICD-10-CM

## 2013-03-10 DIAGNOSIS — G4734 Idiopathic sleep related nonobstructive alveolar hypoventilation: Secondary | ICD-10-CM

## 2013-03-10 DIAGNOSIS — J449 Chronic obstructive pulmonary disease, unspecified: Secondary | ICD-10-CM

## 2013-03-10 MED ORDER — IPRATROPIUM-ALBUTEROL 0.5-2.5 (3) MG/3ML IN SOLN: RESPIRATORY_TRACT | Status: DC

## 2013-03-10 NOTE — Progress Notes (Signed)
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 On max dose advair and already took neb and combivent on day of ov and "no better" with nasal congestion as well. rec Stop advair Start dulera 200 Take 2 puffs first thing in am and then another 2 puffs about 12 hours later.  Continue duoneb 4 x daily and the [place] is only if needed The key is to stop smoking completely before smoking completely stops you!    01/13/2013 f/u ov/Judith Boyer re: copd gold IV/ prefers advair/ 02 concentrator for hs use only Chief Complaint  Patient presents with  . COPD    Breathing has gotten worse since last OV. Reports SOB, coughing with production of mucus, chest tightness and wheezing.  mucus green to yellow> on augmentin per primary care  Prednisone 10 mg weaned to one on 12/4 then back to 2 daily starting 01/09/13 Tends to sleep ok and wakes up on 02 feeling fine but then starts noting breathing worse p walks to bathroom and back and doesn't get around to using am meds for at least 30 min Feels like choking on mucus  rec Please see patient coordinator before you leave today  to schedule delivery of humdified 02  For cough > mucinex dm up to 1200 mg every 12 hours and use flutter valve as much as you can Prednisone 10 mg take  4 each am x 2 days,   2 each am x 2 days,  1 each am x 2 days and stop  Pantoprazole (protonix) 40 mg   Take 30-60 min before first meal of the day and Pepcid  20 mg one bedtime until return to office - this is the best way to tell whether stomach acid is contributing to your problem.   GERD diet    01/27/2013 f/u ov/Judith Boyer re: copd GOLD IV/ noct 02 dep  Chief Complaint  Patient presents with  . Follow-up    Pt reports breathing is about the same- has good days and bad days.  overall better on advair 500/50 vs dulera so switched back  rec Plan A = Automatic Tudorza one twice daily immediately after advair  Ok to use advair 500 / 50 one twice daily  Plan B = Back up = Only use your albuterol (proair)  Plan C = backs up plan B = Only use your albuterol nebulizer up to every 4 hours if proair doesn't work Prednisone 10 mg take  4 each am x 2 days,   2 each am x 2 days,  1 each am x 2 days and stop  02/10/2013 f/u ov/Judith Boyer re: GOLD IV COPD/ noct 02 dep Chief Complaint  Patient presents with  . Follow-up    Pt c/o increased SOB for the past 2 wks. She states using albuterol inhaler and neb 3-4 times per day. Sometimes wakes up at night and has to use rescue  medications.   Can still taste cigs in her sputum. Not using mucinex dm "you told me to stop it"/ otherwise appears adherent Not able to afford tudorza Doe x > room to room slow walking  rec Stop tudorza and cozar Start spiriva one capsule each am> could not urinate so stopped Start diovan (valsartan) 80 mg daily in place of cozar For cough congestion use mucinex up to 1200 mg every 12 hours and as much of the flutter valve as you can    Date of Admission: 02/20/2013 Date of Discharge: 02/26/13   Primary Care Provider: Jenny Reichmann, MD  Consultants: GI, Palliative, Pulmonology  Indication for Hospitalization: Shortness of breath  Discharge Diagnoses/Problem List:  Patient Active Problem List    Diagnosis  Date Noted   .  Palliative care encounter  02/25/2013   .  Weakness generalized  02/25/2013   .  COPD exacerbation  02/20/2013   .  CAP (community acquired pneumonia)  02/20/2013   .   Anemia  02/20/2013   .  Nocturnal hypoxemia  12/30/2012   .  Chest pain at rest  02/09/2011   .  CAD in native artery  02/09/2011   .  COPD GOLD III  02/06/2011   .  Smoker  02/06/2011   .  Hypertension  02/06/2011   .  Diabetes mellitus type 2, controlled, without complications  123456   Disposition: home  Discharge Condition: improved  Discharge Exam:  General: elderly female in NAD  Cardiovascular: Tachycardic but heart sounds distant  Respiratory: Nonlabored, CTAB, diminished sounds  Abdomen: soft, nontender, BS+  Extremities: warm, well-perfused, trace LE edema  Brief Hospital Course:  68 year old female history of coronary disease, COPD presents with shortness of breath was found to have community acquired pneumonia.  # Acute on chronic resp. Failure in setting up PNA and AECOPD: She presented with 4-5 days of worsening shortness of breath. She had recently quit smoking and uses oxygen at night. CT in the ED was negative for PE and showed no consolidation and suspected more of a COPD exacerbation. She was started on Vanc and Zosyn and sent to step down due to her level of sleepiness on initial exam. ABG did show respiratory acidosis but thought sleepiness was due to ativan use in the ED. Her home symbicort and Singulair were continued. She was started on prednisone 50 mg on 1/18 and completed a 5 day course. Legionella antigen, urine legionella antigen and respiratory virus panel were all negative. HIV And strep pneumonia antigen were negative. Pulmonary was consulted and started brovana and pulmicort and discontinued singulair stating it offered no benefit. She was transitioned to Levaquin on 1/20 to compete a 10 day course. A Palliative consult to achieve goals of care was completed. This was undertaken due to her having 5 COPD exacerbations within the recent time frame. She was open to all available medicals interventions to prolong life and maintaining a full code. She was discharged  on advair instead of pulmicort and brovana.  #Dizziness/recent history of right arm weakness/bruit on exam/TIA 2 weeks prior to arrival: An ECHO showed EF 55-60% and mild aortic stenosis (no thrombus), with new carotid bruit on the right side. She was continued on plavix, ASA and statin. A CT head showed no acute intracranial abnormality. Carotid dopplers revealed right 40-59% and left 60-79% and vertebral artery flow is antegrade and mild to moderate ECA stenosis. An MRI was negative for stroke. Due to these findings and GI blood loss  anemia, ASA was discontinued and plavix was continued on discharge.   # CAD: Cardiac work up was negative, CTA negative for PE, with chest pain most likely related to the increased work of breathing.  #Anxeity: Increase in her anxiety, symptoms relating to agoraphobia at baseline. She was started on Prozac and scheduled ativan on 1/19. The ativan was started to act as a bridge until the Prozac started working. Her anxiety improved prior to discharge. These were to continue on discharge.   # Hypertension: Stable and continued on her home medications.   # Normocytic Anemia: Iron panel revealed to have low iron. She was transfused 1 U PRBC's. Her Hgb was trending up toward baseline (Hgb ~11) by time of discharge.  # Weight loss: Unclear in etiology but found to have heme positive stool. She has a history of colon polyps and overdue for surveillance. She will need to follow up with Dr. Amedeo Plenty as an outpt for colonoscopy  # Diabetes: Hb A1c 9.1. She was given a SSI and her home metformin was held. Continued home medications on discharge.  Issues for Follow Up:  - Discharged on advair due to financial considerations and to complete 10-day course of antibiotics with levaquin.  - Will receive home health physical therapy. Ambulatory monitoring of oxygen saturation did not qualify her for home oxygen therapy.  - MRI negative for CVA, discharged on plavix alone, discontinued  aspirin.  - Follow-up noncontrast chest CT in 3 months is recommended to ensure resolution of these findings, as the lesion in the lateral segment of the right middle lobe could alternatively (less likely) represent a low-grade neoplasm such is in adenocarcinoma.  - Found to have heme positive stool. Dr. Amedeo Plenty follow up as outpatient for colonoscopy  - started on Prozac and scheduled ativan to bridge until the Prozac started working given her level of anxiety    03/10/2013 f/u ov/Jezreel Sisk re: copd/ chronic resp failure/ 02 at hs 2lpm humidified  Chief Complaint  Patient presents with  . Follow-up    Pt states recovering from PNA. She c/o still feeling a little SOB since d/c'ed from hospital on 02/26/13. She is using rescue inhaler 4-5 times per day and albuterol neb approx twice daily.   Advair 500 bid Ipatropium tid  Alb saba also     No obvious pattern in day to day or daytime variabilty or assoc  cp or chest tightness, subjective wheeze overt sinus or hb symptoms. No unusual exp hx or h/o childhood pna/ asthma or knowledge of premature birth.  Sleeping ok without nocturnal  or early am exacerbation  of respiratory  c/o's or need for noct saba. Also denies any obvious fluctuation of symptoms with weather or environmental changes or other aggravating or alleviating factors except as outlined above   Current Medications, Allergies, Complete Past Medical History, Past Surgical History, Family History, and Social History were reviewed in [employer] record.  ROS  The following are not active complaints unless bolded sore throat, dysphagia, dental problems, itching, sneezing,  nasal congestion or excess/ purulent secretions, ear ache,   fever, chills, sweats, unintended wt loss, pleuritic or exertional cp, hemoptysis,  orthopnea pnd or leg swelling, presyncope, palpitations, heartburn, abdominal pain, anorexia, nausea, vomiting, diarrhea  or change in bowel or urinary habits,  change in stools or urine, dysuria,hematuria,  rash, arthralgias, visual complaints, headache, numbness weakness or ataxia or problems with walking or coordination,  change in mood/affect or memory.  Objective:   Physical Exam  amb wf nad   01/13/2013        106  > 01/27/2013   105 > 02/10/2013 105 > 03/10/13 107 Wt Readings from Last 3 Encounters:  12/30/12 108 lb 6.4 oz (49.17 kg)  11/13/12 109 lb (49.442 kg)  10/18/12 111 lb (50.349 kg)      HEENT mild turbinate edema.  Oropharynx no thrush or excess pnd or cobblestoning.  No JVD or cervical adenopathy. Mild accessory muscle hypertrophy. Trachea midline, nl thryroid. Chest was hyperinflated by percussion with diminished breath sounds and moderate increased exp time without wheeze. Hoover sign positive at mid inspiration. Regular rate and rhythm without murmur gallop or rub or increase P2 or edema.  Abd: no hsm, nl excursion. Ext warm without cyanosis or clubbing.      CXR  12/30/2012 : COPD without evidence of acute cardiopulmonary disease     Assessment & Plan:

## 2013-03-10 NOTE — Patient Instructions (Addendum)
Continue advair for now Duoneb should be four times daily Only use your albuterol ( proair) as a rescue medication to be used if you can't catch your breath by resting or doing a relaxed purse lip breathing pattern.  - The less you use it, the better it will work when you need it. - Ok to use up to 2 puffs  every 4 hours if you must but call for immediate appointment if use goes up over your usual need - Don't leave home without it !!  (think of it like the spare tire for your car)   Please schedule a follow up office visit in 6 weeks, call sooner if needed

## 2013-03-12 NOTE — Assessment & Plan Note (Signed)
-   night-time oximetry done on 05/07/2012 desat x 39 min <89% - did not qualify for daytime 02 even with mild exac 01/13/13   - humidity added 01/13/2013    Continue hs 02     Each maintenance medication was reviewed in detail including most importantly the difference between maintenance and as needed and under what circumstances the prns are to be used.  Please see instructions for details which were reviewed in writing and the patient given a copy.

## 2013-03-12 NOTE — Assessment & Plan Note (Addendum)
-   quit smoking 01/2013  - hfa 90% p coaching 12/30/2012  - 12/30/2012  Walked RA x 3 laps @ 185 ft each stopped due to legs shaky, sats 94% - 01/13/2013  Walked RA x 3 laps @ 185 ft each stopped due to  End of study, sats 93%  - added tudorza 01/27/13 and d/c duoneb/ atrovent> changed to spiriva 02/10/2013 due to insurance - Spirometry 02/10/2013   FEV1  0.65 (33%) ratio 45   DDX of  difficult airways managment all start with A and  include Adherence, Ace Inhibitors, Acid Reflux, Active Sinus Disease, Alpha 1 Antitripsin deficiency, Anxiety masquerading as Airways dz,  ABPA,  allergy(esp in young), Aspiration (esp in elderly), Adverse effects of DPI,  Active smokers, plus two Bs  = Bronchiectasis and Beta blocker use..and one C= CHF  Adherence is always the initial "prime suspect" and is a multilayered concern that requires a "trust but verify" approach in every patient - starting with knowing how to use medications, especially inhalers, correctly, keeping up with refills and understanding the fundamental difference between maintenance and prns vs those medications only taken for a very short course and then stopped and not refilled.  - confused on how to use maint vs prns.  For now will rx with duoneb as a maint and saba hfa as prn though would untimately like to use a LAMA here   ?Active smoking > denies as of 01/2013, reinforced maint abstinence.

## 2013-04-18 ENCOUNTER — Ambulatory Visit (INDEPENDENT_AMBULATORY_CARE_PROVIDER_SITE_OTHER): Payer: Medicare Other | Admitting: Emergency Medicine

## 2013-04-18 VITALS — BP 126/60 | HR 119 | Temp 98.4°F | Resp 20 | Ht 61.5 in | Wt 105.1 lb

## 2013-04-18 DIAGNOSIS — E119 Type 2 diabetes mellitus without complications: Secondary | ICD-10-CM

## 2013-04-18 DIAGNOSIS — R000 Tachycardia, unspecified: Secondary | ICD-10-CM

## 2013-04-18 DIAGNOSIS — R0602 Shortness of breath: Secondary | ICD-10-CM

## 2013-04-18 DIAGNOSIS — J441 Chronic obstructive pulmonary disease with (acute) exacerbation: Secondary | ICD-10-CM

## 2013-04-18 DIAGNOSIS — K921 Melena: Secondary | ICD-10-CM

## 2013-04-18 DIAGNOSIS — D500 Iron deficiency anemia secondary to blood loss (chronic): Secondary | ICD-10-CM

## 2013-04-18 LAB — FERRITIN: Ferritin: 13 ng/mL (ref 10–291)

## 2013-04-18 LAB — COMPREHENSIVE METABOLIC PANEL
ALBUMIN: 4.2 g/dL (ref 3.5–5.2)
ALK PHOS: 78 U/L (ref 39–117)
ALT: 13 U/L (ref 0–35)
AST: 15 U/L (ref 0–37)
BILIRUBIN TOTAL: 0.2 mg/dL (ref 0.2–1.2)
BUN: 10 mg/dL (ref 6–23)
CO2: 30 mEq/L (ref 19–32)
Calcium: 9.6 mg/dL (ref 8.4–10.5)
Chloride: 101 mEq/L (ref 96–112)
Creat: 0.48 mg/dL — ABNORMAL LOW (ref 0.50–1.10)
Glucose, Bld: 132 mg/dL — ABNORMAL HIGH (ref 70–99)
POTASSIUM: 4.3 meq/L (ref 3.5–5.3)
SODIUM: 139 meq/L (ref 135–145)
TOTAL PROTEIN: 6.4 g/dL (ref 6.0–8.3)

## 2013-04-18 LAB — IRON AND TIBC
%SAT: 20 % (ref 20–55)
Iron: 83 ug/dL (ref 42–145)
TIBC: 412 ug/dL (ref 250–470)
UIBC: 329 ug/dL (ref 125–400)

## 2013-04-18 LAB — POCT CBC
Granulocyte percent: 71.3 %G (ref 37–80)
HCT, POC: 38.4 % (ref 37.7–47.9)
HEMOGLOBIN: 11.7 g/dL — AB (ref 12.2–16.2)
LYMPH, POC: 1.8 (ref 0.6–3.4)
MCH, POC: 26.2 pg — AB (ref 27–31.2)
MCHC: 30.5 g/dL — AB (ref 31.8–35.4)
MCV: 86 fL (ref 80–97)
MID (cbc): 0.3 (ref 0–0.9)
MPV: 10.8 fL (ref 0–99.8)
POC Granulocyte: 5.4 (ref 2–6.9)
POC LYMPH %: 24.3 % (ref 10–50)
POC MID %: 4.4 %M (ref 0–12)
Platelet Count, POC: 317 10*3/uL (ref 142–424)
RBC: 4.46 M/uL (ref 4.04–5.48)
RDW, POC: 17.8 %
WBC: 7.6 10*3/uL (ref 4.6–10.2)

## 2013-04-18 LAB — VITAMIN B12: VITAMIN B 12: 179 pg/mL — AB (ref 211–911)

## 2013-04-18 LAB — RETICULOCYTES
ABS RETIC: 23 10*3/uL (ref 19.0–186.0)
RBC.: 4.59 MIL/uL (ref 3.87–5.11)
Retic Ct Pct: 0.5 % (ref 0.4–2.3)

## 2013-04-18 LAB — GLUCOSE, POCT (MANUAL RESULT ENTRY): POC GLUCOSE: 133 mg/dL — AB (ref 70–99)

## 2013-04-18 LAB — IFOBT (OCCULT BLOOD): IMMUNOLOGICAL FECAL OCCULT BLOOD TEST: NEGATIVE

## 2013-04-18 NOTE — Progress Notes (Addendum)
Subjective:    Patient ID: Judith Boyer, female    DOB: 1946/01/19, 68 y.o.   MRN: [id number] This chart was scribed for Judith Queen, MD by Vernell Barrier, Medical Scribe. This patient's care was started at 10:45 AM.  HPI HPI Comments: [name] is a 68 y.o. female who presents to the [facility] complaining of SOB and palpitatioins. Pt has lost a significant amount of weight. Pt has been on 2 L of oxygen and usually wears at night; states she has been having to use during the day as well as of recently. Pt had to receive a blood transfusion for anemia during hospitalization 1 month ago. Currently taking iron pills. Pt has not seen her pulmonary specialist or cardiologist recently. Has chronic hx of hematochezia. Pt lives alone; son lives next door.  Review of Systems  Respiratory: Positive for shortness of breath.   Cardiovascular: Positive for palpitations.  Gastrointestinal: Positive for blood in stool.   Objective:  Physical Exam  CONSTITUTIONAL: Patient is tachypneic but moving air  HEAD: Normocephalic/atraumatic EYES: EOMI/PERRL ENMT: Mucous membranes moist NECK: supple no meningeal signs SPINE:entire spine nontender CV: S1/S2 noted, no murmurs/rubs/gallops noted she is tachycardic. LUNGS: Lungs are clear to auscultation bilaterally, she is breathing rapidly but moving air ABDOMEN: soft, nontender, no rebound or guarding GU:no cva tenderness NEURO: Pt is awake/alert, moves all extremitiesx4 EXTREMITIES: pulses normal, full ROM SKIN: warm, color normal PSYCH: no abnormalities of mood noted Results for orders placed in visit on 04/18/13  IFOBT (OCCULT BLOOD)      Result Value Ref Range   IFOBT Negative    GLUCOSE, POCT (MANUAL RESULT ENTRY)      Result Value Ref Range   POC Glucose 133 (*) 70 - 99 mg/dl   Results for orders placed in visit on 04/18/13  IFOBT (OCCULT BLOOD)      Result Value Ref Range   IFOBT Negative    GLUCOSE,  POCT (MANUAL RESULT ENTRY)      Result Value Ref Range   POC Glucose 133 (*) 70 - 99 mg/dl  POCT CBC      Result Value Ref Range   WBC 7.6  4.6 - 10.2 K/uL   Lymph, poc 1.8  0.6 - 3.4   POC LYMPH PERCENT 24.3  10 - 50 %L   MID (cbc) 0.3  0 - 0.9   POC MID % 4.4  0 - 12 %M   POC Granulocyte 5.4  2 - 6.9   Granulocyte percent 71.3  37 - 80 %G   RBC 4.46  4.04 - 5.48 M/uL   Hemoglobin 11.7 (*) 12.2 - 16.2 g/dL   HCT, POC 38.4  37.7 - 47.9 %   MCV 86.0  80 - 97 fL   MCH, POC 26.2 (*) 27 - 31.2 pg   MCHC 30.5 (*) 31.8 - 35.4 g/dL   RDW, POC 17.8     Platelet Count, POC 317  142 - 424 K/uL   MPV 10.8  0 - 99.8 fL   Assessment & Plan:  Patient's goal 3 COPD. When she was in the hospital in January she did have a palliative care consult. She continues to feel weak as a rapid pounding in her chest. She is using her oxygen almost continuously. She is on Advair for financial reasons. She does her nebulizer 4 times a day . She is to continue her on pills and followup with Dr. Amedeo Plenty and resume her  Plavix  I personally performed the services described in this documentation, which was scribed in my presence. The recorded information has been reviewed and is accurate.

## 2013-04-21 ENCOUNTER — Ambulatory Visit: Payer: Medicare Other | Admitting: Internal Medicine

## 2013-04-21 ENCOUNTER — Telehealth: Payer: Self-pay | Admitting: *Deleted

## 2013-04-21 NOTE — Telephone Encounter (Signed)
Spoke with pt, her insurancce doesn't cover her to come into office to receive b12 shot. Pt also has 2 family members in the healthcare field that can give her the monthly injection. Insurance company recommended she give it to herself. Can you call in the Vit B12 along with the syringes and needles. I let pt know I would discuss with you.

## 2013-04-21 NOTE — Telephone Encounter (Signed)
Please forward this to [place]. I would be comfortable with her receiving B12 1 cc injections subcutaneous every month she can have this in a 10 cc vial with needles and syringes.

## 2013-04-22 ENCOUNTER — Telehealth: Payer: Self-pay

## 2013-04-22 NOTE — Telephone Encounter (Signed)
PATIENT STATES SHE SPOKE WITH LAURA TWICE ON Tuesday. SHE WAS GOING TO CALL IN HER B12 INJECTION AS WELL AS THE SYRINGES. WHEN SHE CALLED HER PHARMACY LAST NIGHT, IT STILL HAD NOT BEEN CALLED IN. PLEASE CALL HER WHEN IT HAS BEEN DONE.  BEST  PHONE [phone] (HOME)  [address].   [place]

## 2013-04-23 MED ORDER — "NEEDLE (DISP) 30G X 1/2"" MISC": Status: DC

## 2013-04-23 MED ORDER — CYANOCOBALAMIN 1000 MCG/ML IJ SOLN: INTRAMUSCULAR | Status: DC

## 2013-04-23 MED ORDER — SYRINGE (DISPOSABLE) 3 ML MISC: Status: DC

## 2013-04-23 NOTE — Telephone Encounter (Signed)
Rxs sent and pt notified  

## 2013-05-22 ENCOUNTER — Ambulatory Visit: Payer: Medicare Other | Admitting: Internal Medicine

## 2013-05-24 ENCOUNTER — Ambulatory Visit (INDEPENDENT_AMBULATORY_CARE_PROVIDER_SITE_OTHER): Payer: Medicare Other | Admitting: Emergency Medicine

## 2013-05-24 VITALS — BP 148/70 | HR 115 | Temp 98.0°F | Resp 20 | Wt 104.2 lb

## 2013-05-24 DIAGNOSIS — E119 Type 2 diabetes mellitus without complications: Secondary | ICD-10-CM

## 2013-05-24 DIAGNOSIS — J449 Chronic obstructive pulmonary disease, unspecified: Secondary | ICD-10-CM

## 2013-05-24 DIAGNOSIS — D649 Anemia, unspecified: Secondary | ICD-10-CM

## 2013-05-24 DIAGNOSIS — E538 Deficiency of other specified B group vitamins: Secondary | ICD-10-CM

## 2013-05-24 LAB — POCT CBC
Granulocyte percent: 66.5 %G (ref 37–80)
HCT, POC: 40.8 % (ref 37.7–47.9)
HEMOGLOBIN: 12.5 g/dL (ref 12.2–16.2)
LYMPH, POC: 2.3 (ref 0.6–3.4)
MCH: 27.2 pg (ref 27–31.2)
MCHC: 30.6 g/dL — AB (ref 31.8–35.4)
MCV: 88.8 fL (ref 80–97)
MID (cbc): 0.4 (ref 0–0.9)
MPV: 9.9 fL (ref 0–99.8)
POC Granulocyte: 5.3 (ref 2–6.9)
POC LYMPH PERCENT: 29 %L (ref 10–50)
POC MID %: 4.5 % (ref 0–12)
Platelet Count, POC: 305 10*3/uL (ref 142–424)
RBC: 4.59 M/uL (ref 4.04–5.48)
RDW, POC: 17.8 %
WBC: 8 10*3/uL (ref 4.6–10.2)

## 2013-05-24 LAB — GLUCOSE, POCT (MANUAL RESULT ENTRY): POC Glucose: 241 mg/dl — AB (ref 70–99)

## 2013-05-24 LAB — VITAMIN B12: VITAMIN B 12: 208 pg/mL — AB (ref 211–911)

## 2013-05-24 MED ORDER — IPRATROPIUM-ALBUTEROL 18-103 MCG/ACT IN AERO: 1.0000 | INHALATION_SPRAY | Freq: Four times a day (QID) | RESPIRATORY_TRACT | Status: DC | PRN

## 2013-05-24 MED ORDER — IPRATROPIUM-ALBUTEROL 20-100 MCG/ACT IN AERS: 1.0000 | INHALATION_SPRAY | Freq: Four times a day (QID) | RESPIRATORY_TRACT | Status: DC | PRN

## 2013-05-24 MED ORDER — FLUTICASONE-SALMETEROL 500-50 MCG/DOSE IN AEPB: 1.0000 | INHALATION_SPRAY | Freq: Two times a day (BID) | RESPIRATORY_TRACT | Status: DC

## 2013-05-24 MED ORDER — CYANOCOBALAMIN 1000 MCG/ML IJ SOLN
1000.0000 ug | Freq: Once | INTRAMUSCULAR | Status: AC
  Administered 2013-05-24: 1000 ug via INTRAMUSCULAR

## 2013-05-24 MED ORDER — CYANOCOBALAMIN 1000 MCG/ML IJ SOLN: 1000.0000 ug | INTRAMUSCULAR | Status: DC

## 2013-05-24 NOTE — Progress Notes (Signed)
   Subjective:  This chart was scribed for Judith Russian, MD by Judith Boyer, ED Scribe. This patient was seen in room 11 and the patient's care was started 11:09 AM.    Patient ID: Judith Boyer, female    DOB: 10-Jan-1946, 68 y.o.   MRN: [id number]  HPI HPI Comments: BEATRIS Boyer is a 68 y.o. female with past medical history of COPD, DM, HTN who presents to [facility] requesting lab work today. Patient states her SOB is unchanged and she continues to use oxygen during the day. She has also been using her nebulizer 4 times per day and albuterol inhaler as needed. She has associated palpitations but she denies chest pain. She reports feeling depressed that her COPD symptoms have been flaring up. She would like medication refill for Advair and needs next vitamin B12 injection today.    Patient's son lives next door to her.   Review of Systems  Constitutional: Negative for fever.  Respiratory: Positive for shortness of breath.   Cardiovascular: Positive for palpitations. Negative for chest pain.  Psychiatric/Behavioral: Positive for dysphoric mood.       Objective:   Physical Exam  Vitals reviewed.   CONSTITUTIONAL: Well developed/well nourished HEAD: Normocephalic/atraumatic EYES: EOMI/PERRL ENMT: Mucous membranes moist NECK: supple no meningeal signs SPINE:entire spine nontender CV: S1/S2 noted, no murmurs/rubs/gallops noted LUNGS: Lungs are clear to auscultation bilaterally, no apparent distress there is significant increased AP diameter with very poor air exchange ABDOMEN: soft, nontender, no rebound or guarding GU:no cva tenderness NEURO: Pt is awake/alert, moves all extremitiesx4 EXTREMITIES: pulses normal, full ROM SKIN: warm, color normal PSYCH: no abnormalities of mood noted  Results for orders placed in visit on 05/24/13  POCT CBC      Result Value Ref Range   WBC 8.0  4.6 - 10.2 K/uL   Lymph, poc 2.3  0.6 - 3.4   POC LYMPH PERCENT 29.0  10 - 50 %L   MID (cbc)  0.4  0 - 0.9   POC MID % 4.5  0 - 12 %M   POC Granulocyte 5.3  2 - 6.9   Granulocyte percent 66.5  37 - 80 %G   RBC 4.59  4.04 - 5.48 M/uL   Hemoglobin 12.5  12.2 - 16.2 g/dL   HCT, POC 40.8  37.7 - 47.9 %   MCV 88.8  80 - 97 fL   MCH, POC 27.2  27 - 31.2 pg   MCHC 30.6 (*) 31.8 - 35.4 g/dL   RDW, POC 17.8     Platelet Count, POC 305  142 - 424 K/uL   MPV 9.9  0 - 99.8 fL  GLUCOSE, POCT (MANUAL RESULT ENTRY)      Result Value Ref Range   POC Glucose 241 (*) 70 - 99 mg/dl       Assessment & Plan:  Sugar is still high at 241. She is on Glucophage twice a day. I encouraged her to be careful with her sugar intake. Her Advair and Combivent were refilled. She is losing weight. 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 I personally performed the services described in this documentation, which was scribed in my presence. The recorded information has been reviewed and is accurate.

## 2013-05-26 ENCOUNTER — Ambulatory Visit (INDEPENDENT_AMBULATORY_CARE_PROVIDER_SITE_OTHER): Payer: Medicare Other | Admitting: Internal Medicine

## 2013-05-26 ENCOUNTER — Encounter: Payer: Self-pay | Admitting: Internal Medicine

## 2013-05-26 VITALS — BP 126/70 | HR 113 | Temp 97.8°F | Ht 61.0 in | Wt 103.8 lb

## 2013-05-26 DIAGNOSIS — J449 Chronic obstructive pulmonary disease, unspecified: Secondary | ICD-10-CM

## 2013-05-26 NOTE — Patient Instructions (Addendum)
If you are satisfied with your treatment plan let your doctor know and he/she can either refill your medications or you can return here when your prescription runs out.     If in any way you are not 100% satisfied,  please tell us.  If 100% better, tell your friends!  

## 2013-05-26 NOTE — Progress Notes (Signed)
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 On max dose advair and already took neb and combivent on day of ov and "no better" with nasal congestion as well. rec Stop advair Start dulera 200 Take 2 puffs first thing in am and then another 2 puffs about 12 hours later.  Continue duoneb 4 x daily and the [place] is only if needed The key is to stop smoking completely before smoking completely stops you!    01/13/2013 f/u ov/S[place] re: copd gold IV/ prefers advair/ 02 concentrator for hs use only Chief Complaint  Patient presents with  . COPD    Breathing has gotten worse since last OV. Reports SOB, coughing with production of mucus, chest tightness and wheezing.  mucus green to yellow> on augmentin per primary care  Prednisone 10 mg weaned to one on 12/4 then back to 2 daily starting 01/09/13 Tends to sleep ok and wakes up on 02 feeling fine but then starts noting breathing worse p walks to bathroom and back and doesn't get around to using am meds for at least 30 min Feels like choking on mucus  rec Please see patient coordinator before you leave today  to schedule delivery of humdified 02  For cough > mucinex dm up to 1200 mg every 12 hours and use flutter valve as much as you can Prednisone 10 mg take  4 each am x 2 days,   2 each am x 2 days,  1 each am x 2 days and stop  Pantoprazole (protonix) 40 mg   Take 30-60 min before first meal of the day and  Pepcid 20 mg one bedtime until return to office - this is the best way to tell whether stomach acid is contributing to your problem.   GERD diet    01/27/2013 f/u ov/Charlye Spare re: copd GOLD IV/ noct 02 dep  Chief Complaint  Patient presents with  . Follow-up    Pt reports breathing is about the same- has good days and bad days.  overall better on advair 500/50 vs dulera so switched back  rec Plan A = Automatic Tudorza one twice daily immediately after advair  Ok to use advair 500 / 50 one twice daily  Plan B = Back up = Only use your albuterol (proair)  Plan C = backs up plan B = Only use your albuterol nebulizer up to every 4 hours if proair doesn't work Prednisone 10 mg take  4 each am x 2 days,   2 each am x 2 days,  1 each am x 2 days and stop  02/10/2013 f/u ov/Anastasia Tompson re: GOLD IV COPD/ noct 02 dep Chief Complaint  Patient presents with  . Follow-up    Pt c/o increased SOB for the past 2 wks. She states using albuterol inhaler and neb 3-4 times per day. Sometimes wakes up at night and has to  use rescue medications.   Can still taste cigs in her sputum. Not using mucinex dm "you told me to stop it"/ otherwise appears adherent Not able to afford tudorza Doe x > room to room slow walking  rec Stop tudorza and cozar Start spiriva one capsule each am> could not urinate so stopped Start diovan (valsartan) 80 mg daily in place of cozar For cough congestion use mucinex up to 1200 mg every 12 hours and as much of the flutter valve as you can    Date of Admission: 02/20/2013 Date of Discharge: 02/26/13   Primary Care Provider: Jenny Reichmann, MD  Consultants: GI, Palliative, Pulmonology  Indication for Hospitalization: Shortness of breath  Discharge Diagnoses/Problem List:  Patient Active Problem List    Diagnosis  Date Noted   .  Palliative care encounter  02/25/2013   .  Weakness generalized  02/25/2013   .  COPD exacerbation  02/20/2013   .  CAP (community acquired pneumonia)  02/20/2013    .  Anemia  02/20/2013   .  Nocturnal hypoxemia  12/30/2012   .  Chest pain at rest  02/09/2011   .  CAD in native artery  02/09/2011   .  COPD GOLD III  02/06/2011   .  Smoker  02/06/2011   .  Hypertension  02/06/2011   .  Diabetes mellitus type 2, controlled, without complications  32/35/5732   Disposition: home  Discharge Condition: improved  Discharge Exam:  General: elderly female in NAD  Cardiovascular: Tachycardic but heart sounds distant  Respiratory: Nonlabored, CTAB, diminished sounds  Abdomen: soft, nontender, BS+  Extremities: warm, well-perfused, trace LE edema  Brief Hospital Course:  68 year old female history of coronary disease, COPD presents with shortness of breath was found to have community acquired pneumonia.  # Acute on chronic resp. Failure in setting up PNA and AECOPD: She presented with 4-5 days of worsening shortness of breath. She had recently quit smoking and uses oxygen at night. CT in the ED was negative for PE and showed no consolidation and suspected more of a COPD exacerbation. She was started on Vanc and Zosyn and sent to step down due to her level of sleepiness on initial exam. ABG did show respiratory acidosis but thought sleepiness was due to ativan use in the ED. Her home symbicort and Singulair were continued. She was started on prednisone 50 mg on 1/18 and completed a 5 day course. Legionella antigen, urine legionella antigen and respiratory virus panel were all negative. HIV And strep pneumonia antigen were negative. Pulmonary was consulted and started brovana and pulmicort and discontinued singulair stating it offered no benefit. She was transitioned to Levaquin on 1/20 to compete a 10 day course. A Palliative consult to achieve goals of care was completed. This was undertaken due to her having 5 COPD exacerbations within the recent time frame. She was open to all available medicals interventions to prolong life and maintaining a full code. She was  discharged on advair instead of pulmicort and brovana.  #Dizziness/recent history of right arm weakness/bruit on exam/TIA 2 weeks prior to arrival: An ECHO showed EF 55-60% and mild aortic stenosis (no thrombus), with new carotid bruit on the right side. She was continued on plavix, ASA and statin. A CT head showed no acute intracranial abnormality. Carotid dopplers revealed right 40-59% and left 60-79% and vertebral artery flow is antegrade and mild to moderate ECA stenosis. An MRI was negative for stroke. Due to these findings and GI  blood loss anemia, ASA was discontinued and plavix was continued on discharge.   # CAD: Cardiac work up was negative, CTA negative for PE, with chest pain most likely related to the increased work of breathing.  #Anxeity: Increase in her anxiety, symptoms relating to agoraphobia at baseline. She was started on Prozac and scheduled ativan on 1/19. The ativan was started to act as a bridge until the Prozac started working. Her anxiety improved prior to discharge. These were to continue on discharge.   # Hypertension: Stable and continued on her home medications.   # Normocytic Anemia: Iron panel revealed to have low iron. She was transfused 1 U PRBC's. Her Hgb was trending up toward baseline (Hgb ~11) by time of discharge.  # Weight loss: Unclear in etiology but found to have heme positive stool. She has a history of colon polyps and overdue for surveillance. She will need to follow up with Dr. Amedeo Plenty as an outpt for colonoscopy  # Diabetes: Hb A1c 9.1. She was given a SSI and her home metformin was held. Continued home medications on discharge.  Issues for Follow Up:  - Discharged on advair due to financial considerations and to complete 10-day course of antibiotics with levaquin.  - Will receive home health physical therapy. Ambulatory monitoring of oxygen saturation did not qualify her for home oxygen therapy.  - MRI negative for CVA, discharged on plavix alone,  discontinued aspirin.  - Follow-up noncontrast chest CT in 3 months is recommended to ensure resolution of these findings, as the lesion in the lateral segment of the right middle lobe could alternatively (less likely) represent a low-grade neoplasm such is in adenocarcinoma.  - Found to have heme positive stool. Dr. Amedeo Plenty follow up as outpatient for colonoscopy  - started on Prozac and scheduled ativan to bridge until the Prozac started working given her level of anxiety    03/10/2013 f/u ov/Hebe Merriwether re: copd/ chronic resp failure/ 02 at hs 2lpm humidified  Chief Complaint  Patient presents with  . Follow-up    Pt states recovering from PNA. She c/o still feeling a little SOB since d/c'ed from hospital on 02/26/13. She is using rescue inhaler 4-5 times per day and albuterol neb approx twice daily.   Advair 500 bid Ipatropium tid  Alb saba also  rec Continue advair for now Duoneb should be four times daily Only use your albuterol ( proair)    05/26/2013 f/u ov/Lashona Schaaf re: CODP GOLD III plus noct 02  Chief Complaint  Patient presents with  . Follow-up    Breathing is overall doing well. She states that she is using proair at least 2 x per day, and has not used albuterol neb recently.  uses proair first thing in am because advair and duoneb too slow to work but overall satisfied at her usual baseline doe.    No   day to day or daytime variabilty or assoc cough or  cp or chest tightness, subjective wheeze overt sinus or hb symptoms. No unusual exp hx or h/o childhood pna/ asthma or knowledge of premature birth.  Sleeping ok without nocturnal  or early am exacerbation  of respiratory  c/o's or need for noct saba. Also denies any obvious fluctuation of symptoms with weather or environmental changes or other aggravating or alleviating factors except as outlined above   Current Medications, Allergies, Complete Past Medical History, Past Surgical History, Family History, and Social History were reviewed  in [employer] record.  ROS  The following are not active complaints unless bolded sore throat, dysphagia, dental problems, itching, sneezing,  nasal congestion or excess/ purulent secretions, ear ache,   fever, chills, sweats, unintended wt loss, pleuritic or exertional cp, hemoptysis,  orthopnea pnd or leg swelling, presyncope, palpitations, heartburn, abdominal pain, anorexia, nausea, vomiting, diarrhea  or change in bowel or urinary habits, change in stools or urine, dysuria,hematuria,  rash, arthralgias, visual complaints, headache, numbness weakness or ataxia or problems with walking or coordination,  change in mood/affect or memory.                    Objective:   Physical Exam  amb wf nad   01/13/2013        106  > 01/27/2013   105 > 02/10/2013 105 > 03/10/13 107 > 05/26/2013 104  Wt Readings from Last 3 Encounters:  12/30/12 108 lb 6.4 oz (49.17 kg)  11/13/12 109 lb (49.442 kg)  10/18/12 111 lb (50.349 kg)      HEENT mild turbinate edema.  Oropharynx no thrush or excess pnd or cobblestoning.  No JVD or cervical adenopathy. Mild accessory muscle hypertrophy. Trachea midline, nl thryroid. Chest was hyperinflated by percussion with diminished breath sounds and moderate increased exp time without wheeze. Hoover sign positive at mid inspiration. Regular rate and rhythm without murmur gallop or rub or increase P2 or edema.  Abd: no hsm, nl excursion. Ext warm without cyanosis or clubbing.      CXR  12/30/2012 : COPD without evidence of acute cardiopulmonary disease     Assessment & Plan:

## 2013-05-26 NOTE — Assessment & Plan Note (Signed)
-   quit smoking 01/2013  - hfa 90% p coaching 12/30/2012  - 12/30/2012  Walked RA x 3 laps @ 185 ft each stopped due to legs shaky, sats 94% - 01/13/2013  Walked RA x 3 laps @ 185 ft each stopped due to  End of study, sats 93%  - added tudorza 01/27/13 and d/c duoneb/ atrovent> changed to spiriva 02/10/2013 due to insurance> could not urinate so stopped it - Spirometry 02/10/2013   FEV1  0.65 (33%) ratio 45    I had an extended summary discussion with the patient today lasting 15 to 20 minutes of a 25 minute visit on the following issues:  Despite severity of dz and unusual med combination (due to intolerance/formular restrictions) she is doing fairly well maintaining good qol s tendency to exac on present rx.    Each maintenance medication was reviewed in detail including most importantly the difference between maintenance and as needed and under what circumstances the prns are to be used.  Please see instructions for details which were reviewed in writing and the patient given a copy.    Pulmonary f/u can be prn

## 2013-06-28 ENCOUNTER — Ambulatory Visit (INDEPENDENT_AMBULATORY_CARE_PROVIDER_SITE_OTHER): Payer: Medicare Other | Admitting: Emergency Medicine

## 2013-06-28 VITALS — BP 130/60 | HR 112 | Temp 98.3°F | Resp 20 | Ht 61.0 in | Wt 102.0 lb

## 2013-06-28 DIAGNOSIS — E785 Hyperlipidemia, unspecified: Secondary | ICD-10-CM

## 2013-06-28 DIAGNOSIS — R7309 Other abnormal glucose: Secondary | ICD-10-CM

## 2013-06-28 DIAGNOSIS — J441 Chronic obstructive pulmonary disease with (acute) exacerbation: Secondary | ICD-10-CM

## 2013-06-28 DIAGNOSIS — R739 Hyperglycemia, unspecified: Secondary | ICD-10-CM

## 2013-06-28 DIAGNOSIS — I2510 Atherosclerotic heart disease of native coronary artery without angina pectoris: Secondary | ICD-10-CM

## 2013-06-28 DIAGNOSIS — D649 Anemia, unspecified: Secondary | ICD-10-CM

## 2013-06-28 DIAGNOSIS — E538 Deficiency of other specified B group vitamins: Secondary | ICD-10-CM

## 2013-06-28 LAB — POCT CBC
GRANULOCYTE PERCENT: 65 % (ref 37–80)
HCT, POC: 40.1 % (ref 37.7–47.9)
Hemoglobin: 12.1 g/dL — AB (ref 12.2–16.2)
LYMPH, POC: 2.6 (ref 0.6–3.4)
MCH, POC: 27.1 pg (ref 27–31.2)
MCHC: 30.2 g/dL — AB (ref 31.8–35.4)
MCV: 89.9 fL (ref 80–97)
MID (CBC): 0.5 (ref 0–0.9)
MPV: 10.2 fL (ref 0–99.8)
PLATELET COUNT, POC: 302 10*3/uL (ref 142–424)
POC Granulocyte: 5.7 (ref 2–6.9)
POC LYMPH %: 29.4 % (ref 10–50)
POC MID %: 5.6 % (ref 0–12)
RBC: 4.46 M/uL (ref 4.04–5.48)
RDW, POC: 15 %
WBC: 8.7 10*3/uL (ref 4.6–10.2)

## 2013-06-28 LAB — VITAMIN B12: VITAMIN B 12: 323 pg/mL (ref 211–911)

## 2013-06-28 LAB — GLUCOSE, POCT (MANUAL RESULT ENTRY): POC GLUCOSE: 93 mg/dL (ref 70–99)

## 2013-06-28 MED ORDER — MONTELUKAST SODIUM 10 MG PO TABS: 10.0000 mg | ORAL_TABLET | Freq: Every day | ORAL | Status: DC

## 2013-06-28 MED ORDER — CLOPIDOGREL BISULFATE 75 MG PO TABS: 75.0000 mg | ORAL_TABLET | Freq: Every morning | ORAL | Status: DC

## 2013-06-28 MED ORDER — PRAVASTATIN SODIUM 80 MG PO TABS: 80.0000 mg | ORAL_TABLET | Freq: Every evening | ORAL | Status: DC

## 2013-06-28 MED ORDER — "NEEDLE (DISP) 30G X 1/2"" MISC": Status: DC

## 2013-06-28 MED ORDER — CYANOCOBALAMIN 1000 MCG/ML IJ SOLN: INTRAMUSCULAR | Status: DC

## 2013-06-28 MED ORDER — METFORMIN HCL 1000 MG PO TABS: 1000.0000 mg | ORAL_TABLET | Freq: Two times a day (BID) | ORAL | Status: DC

## 2013-06-28 NOTE — Progress Notes (Signed)
   Subjective:    Patient ID: Judith Boyer, female    DOB: [birth year], 68 y.o.   MRN: [id number]  HPI patient for followup severe COPD persistent tachycardia anemia , B12 deficiency, hyperglycemia. Overall she's been doing about the same. There've been no specific changes in how she is feeling. She gets short of breath easily. She woke up this morning somewhat dizzy lightheaded but improved after taking a nap he    Review of Systems     Objective:   Physical Exam  Constitutional: She appears well-developed.  HENT:  Head: Normocephalic.  Eyes: Pupils are equal, round, and reactive to light.  Neck: No thyromegaly present.  Cardiovascular:  She has a significant tachycardia without murmur.  Pulmonary/Chest:  She has an increased AP diameter hyperresonance to percussion with diminished breath sounds both lung fields.  Abdominal: Soft. Bowel sounds are normal. There is no tenderness.  Musculoskeletal: Normal range of motion.   Results for orders placed in visit on 06/28/13  POCT CBC      Result Value Ref Range   WBC 8.7  4.6 - 10.2 K/uL   Lymph, poc 2.6  0.6 - 3.4   POC LYMPH PERCENT 29.4  10 - 50 %L   MID (cbc) 0.5  0 - 0.9   POC MID % 5.6  0 - 12 %M   POC Granulocyte 5.7  2 - 6.9   Granulocyte percent 65.0  37 - 80 %G   RBC 4.46  4.04 - 5.48 M/uL   Hemoglobin 12.1 (*) 12.2 - 16.2 g/dL   HCT, POC 40.1  37.7 - 47.9 %   MCV 89.9  80 - 97 fL   MCH, POC 27.1  27 - 31.2 pg   MCHC 30.2 (*) 31.8 - 35.4 g/dL   RDW, POC 15.0     Platelet Count, POC 302  142 - 424 K/uL   MPV 10.2  0 - 99.8 fL  GLUCOSE, POCT (MANUAL RESULT ENTRY)      Result Value Ref Range   POC Glucose 93  70 - 99 mg/dl         Assessment & Plan:  Patient stable at present. I repeated her blood work today. She had a B12 shot on Monday

## 2013-10-15 ENCOUNTER — Telehealth: Payer: Self-pay | Admitting: *Deleted

## 2013-10-15 NOTE — Telephone Encounter (Signed)
Phoned Dr. Maurie Boettcher office to obtain most recent diabetic eye exam.  Patient has not been seen since 04/02/2012 and does not have an OV scheduled with him at this time.  Per patient's noted communication preference, I will send her a message via [place].

## 2013-10-20 ENCOUNTER — Telehealth: Payer: Self-pay | Admitting: *Deleted

## 2013-10-20 NOTE — Telephone Encounter (Signed)
Phoned patient and scheduled Annual Medicare Wellness Exam with Dr. Everlene Farrier Thursday 01/14/14 @ 1015.

## 2013-11-16 ENCOUNTER — Telehealth: Payer: Self-pay | Admitting: Internal Medicine

## 2013-11-16 NOTE — Telephone Encounter (Signed)
No samples available  Pt aware  Nothing further needed

## 2013-12-11 ENCOUNTER — Telehealth: Payer: Self-pay

## 2013-12-11 NOTE — Telephone Encounter (Signed)
Patient called. Needs albuterol rx sent to [facility] on [address].

## 2013-12-12 NOTE — Telephone Encounter (Signed)
Patient called again to check on the status of the albuterol rx.

## 2013-12-13 NOTE — Telephone Encounter (Signed)
Patient called in again to check status of albuterol rx. I advised to allot for 24- 72 business hours, and that she would receive a phone call when this has been processed.

## 2013-12-14 ENCOUNTER — Telehealth: Payer: Self-pay | Admitting: *Deleted

## 2013-12-14 MED ORDER — ALBUTEROL SULFATE HFA 108 (90 BASE) MCG/ACT IN AERS: 2.0000 | INHALATION_SPRAY | RESPIRATORY_TRACT | Status: DC | PRN

## 2013-12-28 NOTE — Telephone Encounter (Signed)
error 

## 2014-01-14 ENCOUNTER — Encounter: Payer: Self-pay | Admitting: Emergency Medicine

## 2014-01-14 ENCOUNTER — Ambulatory Visit (INDEPENDENT_AMBULATORY_CARE_PROVIDER_SITE_OTHER): Payer: Medicare Other | Admitting: Emergency Medicine

## 2014-01-14 ENCOUNTER — Encounter (HOSPITAL_COMMUNITY): Payer: Self-pay | Admitting: Cardiovascular Disease

## 2014-01-14 VITALS — BP 132/82 | HR 100 | Temp 98.2°F | Resp 18 | Ht 61.0 in | Wt 99.0 lb

## 2014-01-14 DIAGNOSIS — J438 Other emphysema: Secondary | ICD-10-CM

## 2014-01-14 DIAGNOSIS — Z23 Encounter for immunization: Secondary | ICD-10-CM

## 2014-01-14 DIAGNOSIS — R0602 Shortness of breath: Secondary | ICD-10-CM

## 2014-01-14 LAB — POCT CBC
GRANULOCYTE PERCENT: 68.5 % (ref 37–80)
HEMATOCRIT: 37.9 % (ref 37.7–47.9)
HEMOGLOBIN: 12.7 g/dL (ref 12.2–16.2)
LYMPH, POC: 2 (ref 0.6–3.4)
MCH, POC: 30.3 pg (ref 27–31.2)
MCHC: 33.4 g/dL (ref 31.8–35.4)
MCV: 90.7 fL (ref 80–97)
MID (cbc): 0.4 (ref 0–0.9)
MPV: 8.2 fL (ref 0–99.8)
POC Granulocyte: 5.3 (ref 2–6.9)
POC LYMPH %: 26.4 % (ref 10–50)
POC MID %: 5.1 %M (ref 0–12)
Platelet Count, POC: 269 10*3/uL (ref 142–424)
RBC: 4.17 M/uL (ref 4.04–5.48)
RDW, POC: 13.3 %
WBC: 7.7 10*3/uL (ref 4.6–10.2)

## 2014-01-14 LAB — GLUCOSE, POCT (MANUAL RESULT ENTRY): POC GLUCOSE: 96 mg/dL (ref 70–99)

## 2014-01-14 LAB — POCT GLYCOSYLATED HEMOGLOBIN (HGB A1C): HEMOGLOBIN A1C: 6.1

## 2014-01-14 MED ORDER — IPRATROPIUM-ALBUTEROL 0.5-2.5 (3) MG/3ML IN SOLN: RESPIRATORY_TRACT | Status: DC

## 2014-01-14 MED ORDER — ALBUTEROL SULFATE HFA 108 (90 BASE) MCG/ACT IN AERS: 2.0000 | INHALATION_SPRAY | RESPIRATORY_TRACT | Status: DC | PRN

## 2014-01-14 MED ORDER — ALBUTEROL SULFATE (2.5 MG/3ML) 0.083% IN NEBU: 2.5000 mg | INHALATION_SOLUTION | Freq: Once | RESPIRATORY_TRACT | Status: DC

## 2014-01-14 MED ORDER — ALBUTEROL SULFATE (2.5 MG/3ML) 0.083% IN NEBU: 2.5000 mg | INHALATION_SOLUTION | RESPIRATORY_TRACT | Status: DC | PRN

## 2014-01-14 MED ORDER — PREDNISONE 10 MG PO TABS: ORAL_TABLET | ORAL | Status: DC

## 2014-01-14 MED ORDER — INFLUENZA VAC SPLIT QUAD 0.5 ML IM SUSY: 0.5000 mL | PREFILLED_SYRINGE | INTRAMUSCULAR | Status: DC

## 2014-01-14 MED ORDER — PREDNISOLONE 15 MG/5ML PO SOLN
45.0000 mg | Freq: Once | ORAL | Status: AC
  Administered 2014-01-14: 45 mg via ORAL

## 2014-01-14 MED ORDER — FLUTICASONE-SALMETEROL 500-50 MCG/DOSE IN AEPB: 1.0000 | INHALATION_SPRAY | Freq: Two times a day (BID) | RESPIRATORY_TRACT | Status: DC

## 2014-01-14 MED ORDER — IPRATROPIUM BROMIDE 0.02 % IN SOLN: 0.5000 mg | Freq: Once | RESPIRATORY_TRACT | Status: DC

## 2014-01-14 NOTE — Progress Notes (Addendum)
Subjective:    Patient ID: Judith Boyer, female    DOB: [birth year], 68 y.o.   MRN: [id number] This chart was scribed for [name], MD by Zola Button, Medical Scribe. This patient was seen in room 6 and the patient's care was started at 10:55 AM.   HPI HPI Comments: Judith Boyer is a 68 y.o. female who presents to the [facility] complaining of gradual onset, gradually worsening SOB. Patient states that she has had a recent sinus infection. Patient states she has lost a lot of weight because it is hard for her to cook. She is on 2L of O2 at home. She did a nebulizer treatment around 8am this morning. She denies fever and chills. Patient drove herself here today. Patient has her son and granddaughter help her out; her son lives next door but sleeps at her house. She has not had her flu shot this year. Her son smokes, but does not smoke in her house. She last went to her pulmonologist in February of this year. She has not seen a cardiologist recently; she states her heart seemed fine when she was in the hospital.   Patient is on Advair. She broke out in hives when she was on Dulera   Review of Systems  Constitutional: Negative for fever and chills.  Respiratory: Positive for shortness of breath.        Objective:   Physical Exam CONSTITUTIONAL: Well developed/well nourished. Pursing of her lips to improve her breathing. HEAD: Normocephalic/atraumatic EYES: EOM/PERRL ENMT: Mucous membranes moist NECK: supple no meningeal signs SPINE: entire spine nontender CV: S1/S2 noted, no murmurs/rubs/gallops noted LUNGS: Barrel chest deformity with decreased breath sounds in both bases and poor air exchange. ABDOMEN: soft, nontender, no rebound or guarding GU: no cva tenderness NEURO: Pt is awake/alert, moves all extremitiesx4 EXTREMITIES: pulses normal, full ROM. No edema. SKIN: warm, color normal PSYCH: no abnormalities of mood noted  Results for orders  placed or performed in visit on 01/14/14  POCT CBC  Result Value Ref Range   WBC 7.7 4.6 - 10.2 K/uL   Lymph, poc 2.0 0.6 - 3.4   POC LYMPH PERCENT 26.4 10 - 50 %L   MID (cbc) 0.4 0 - 0.9   POC MID % 5.1 0 - 12 %M   POC Granulocyte 5.3 2 - 6.9   Granulocyte percent 68.5 37 - 80 %G   RBC 4.17 4.04 - 5.48 M/uL   Hemoglobin 12.7 12.2 - 16.2 g/dL   HCT, POC 37.9 37.7 - 47.9 %   MCV 90.7 80 - 97 fL   MCH, POC 30.3 27 - 31.2 pg   MCHC 33.4 31.8 - 35.4 g/dL   RDW, POC 13.3 %   Platelet Count, POC 269 142 - 424 K/uL   MPV 8.2 0 - 99.8 fL  POCT glucose (manual entry)  Result Value Ref Range   POC Glucose 96 70 - 99 mg/dl  POCT glycosylated hemoglobin (Hb A1C)  Result Value Ref Range   Hemoglobin A1C 6.1       Assessment & Plan:   With ambulation patient drops her oxygen concentration to 83%. She had just taken a nebulizer treatment around 9. Patient given Prelone 45 mg. We'll place on prednisone taper. Arranging for home O2 continuous at 2 L/m. We'll also see him get her a portable oxygen tank. Flu shot will be given.I personally performed the services described in this documentation, which was scribed in my presence.  The recorded information has been reviewed and is accurate. Recheck 3 months.I personally performed the services described in this documentation, which was scribed in my presence. The recorded information has been reviewed and is accurate. On 2 L at rest oxygen concentration 99% with ambulation on 2 L oxygen 97-98%

## 2014-01-15 LAB — COMPLETE METABOLIC PANEL WITH GFR
ALBUMIN: 4.4 g/dL (ref 3.5–5.2)
ALT: 17 U/L (ref 0–35)
AST: 20 U/L (ref 0–37)
Alkaline Phosphatase: 85 U/L (ref 39–117)
BUN: 9 mg/dL (ref 6–23)
CALCIUM: 9.7 mg/dL (ref 8.4–10.5)
CHLORIDE: 98 meq/L (ref 96–112)
CO2: 30 mEq/L (ref 19–32)
Creat: 0.41 mg/dL — ABNORMAL LOW (ref 0.50–1.10)
GFR, Est Non African American: >89 mL/min
Glucose, Bld: 94 mg/dL (ref 70–99)
POTASSIUM: 4.2 meq/L (ref 3.5–5.3)
Sodium: 140 mEq/L (ref 135–145)
TOTAL PROTEIN: 6.8 g/dL (ref 6.0–8.3)
Total Bilirubin: 0.2 mg/dL (ref 0.2–1.2)

## 2014-01-18 ENCOUNTER — Encounter: Payer: Medicare Other | Admitting: Emergency Medicine

## 2014-02-07 ENCOUNTER — Other Ambulatory Visit: Payer: Self-pay | Admitting: Emergency Medicine

## 2014-02-08 ENCOUNTER — Emergency Department (HOSPITAL_COMMUNITY): Payer: Medicare Other

## 2014-02-08 ENCOUNTER — Ambulatory Visit (INDEPENDENT_AMBULATORY_CARE_PROVIDER_SITE_OTHER): Payer: Medicare Other | Admitting: Emergency Medicine

## 2014-02-08 ENCOUNTER — Inpatient Hospital Stay (HOSPITAL_COMMUNITY)
Admission: EM | Admit: 2014-02-08 | Discharge: 2014-02-11 | DRG: 190 | Disposition: A | Discharge status: Home / Self Care | Payer: Medicare Other | Attending: Internal Medicine | Admitting: Internal Medicine

## 2014-02-08 ENCOUNTER — Ambulatory Visit (INDEPENDENT_AMBULATORY_CARE_PROVIDER_SITE_OTHER): Payer: Medicare Other

## 2014-02-08 ENCOUNTER — Encounter (HOSPITAL_COMMUNITY): Payer: Self-pay | Admitting: *Deleted

## 2014-02-08 VITALS — BP 156/58 | HR 123 | Temp 98.0°F | Resp 48

## 2014-02-08 DIAGNOSIS — Z8249 Family history of ischemic heart disease and other diseases of the circulatory system: Secondary | ICD-10-CM | POA: Diagnosis not present

## 2014-02-08 DIAGNOSIS — E785 Hyperlipidemia, unspecified: Secondary | ICD-10-CM | POA: Diagnosis present

## 2014-02-08 DIAGNOSIS — I252 Old myocardial infarction: Secondary | ICD-10-CM

## 2014-02-08 DIAGNOSIS — R0602 Shortness of breath: Secondary | ICD-10-CM | POA: Diagnosis not present

## 2014-02-08 DIAGNOSIS — J9601 Acute respiratory failure with hypoxia: Secondary | ICD-10-CM | POA: Diagnosis present

## 2014-02-08 DIAGNOSIS — J80 Acute respiratory distress syndrome: Secondary | ICD-10-CM

## 2014-02-08 DIAGNOSIS — J441 Chronic obstructive pulmonary disease with (acute) exacerbation: Secondary | ICD-10-CM

## 2014-02-08 DIAGNOSIS — J449 Chronic obstructive pulmonary disease, unspecified: Secondary | ICD-10-CM | POA: Diagnosis not present

## 2014-02-08 DIAGNOSIS — R918 Other nonspecific abnormal finding of lung field: Secondary | ICD-10-CM | POA: Diagnosis not present

## 2014-02-08 DIAGNOSIS — Z681 Body mass index (BMI) 19 or less, adult: Secondary | ICD-10-CM | POA: Diagnosis not present

## 2014-02-08 DIAGNOSIS — M8580 Other specified disorders of bone density and structure, unspecified site: Secondary | ICD-10-CM | POA: Diagnosis not present

## 2014-02-08 DIAGNOSIS — E538 Deficiency of other specified B group vitamins: Secondary | ICD-10-CM | POA: Diagnosis not present

## 2014-02-08 DIAGNOSIS — I10 Essential (primary) hypertension: Secondary | ICD-10-CM | POA: Diagnosis present

## 2014-02-08 DIAGNOSIS — E43 Unspecified severe protein-calorie malnutrition: Secondary | ICD-10-CM | POA: Diagnosis not present

## 2014-02-08 DIAGNOSIS — E119 Type 2 diabetes mellitus without complications: Secondary | ICD-10-CM | POA: Diagnosis not present

## 2014-02-08 DIAGNOSIS — I2510 Atherosclerotic heart disease of native coronary artery without angina pectoris: Secondary | ICD-10-CM | POA: Diagnosis not present

## 2014-02-08 DIAGNOSIS — Z87891 Personal history of nicotine dependence: Secondary | ICD-10-CM | POA: Diagnosis not present

## 2014-02-08 DIAGNOSIS — Z955 Presence of coronary angioplasty implant and graft: Secondary | ICD-10-CM | POA: Diagnosis not present

## 2014-02-08 DIAGNOSIS — J209 Acute bronchitis, unspecified: Secondary | ICD-10-CM | POA: Diagnosis present

## 2014-02-08 DIAGNOSIS — J440 Chronic obstructive pulmonary disease with acute lower respiratory infection: Secondary | ICD-10-CM | POA: Diagnosis not present

## 2014-02-08 DIAGNOSIS — Z9981 Dependence on supplemental oxygen: Secondary | ICD-10-CM

## 2014-02-08 DIAGNOSIS — R0603 Acute respiratory distress: Secondary | ICD-10-CM | POA: Diagnosis present

## 2014-02-08 HISTORY — DX: Anemia, unspecified: D64.9

## 2014-02-08 HISTORY — DX: Pure hypercholesterolemia, unspecified: E78.00

## 2014-02-08 HISTORY — DX: Type 2 diabetes mellitus without complications: E11.9

## 2014-02-08 HISTORY — DX: Dependence on supplemental oxygen: Z99.81

## 2014-02-08 HISTORY — DX: Pneumonia, unspecified organism: J18.9

## 2014-02-08 HISTORY — DX: Personal history of other medical treatment: Z92.89

## 2014-02-08 LAB — I-STAT TROPONIN, ED: Troponin i, poc: 0 ng/mL (ref 0.00–0.08)

## 2014-02-08 LAB — CBG MONITORING, ED: GLUCOSE-CAPILLARY: 216 mg/dL — AB (ref 70–99)

## 2014-02-08 LAB — CBC
HCT: 36.8 % (ref 36.0–46.0)
HEMATOCRIT: 37.3 % (ref 36.0–46.0)
Hemoglobin: 11.8 g/dL — ABNORMAL LOW (ref 12.0–15.0)
Hemoglobin: 11.8 g/dL — ABNORMAL LOW (ref 12.0–15.0)
MCH: 29.5 pg (ref 26.0–34.0)
MCH: 29.1 pg (ref 26.0–34.0)
MCHC: 32.1 g/dL (ref 30.0–36.0)
MCHC: 31.6 g/dL (ref 30.0–36.0)
MCV: 92 fL (ref 78.0–100.0)
MCV: 91.9 fL (ref 78.0–100.0)
PLATELETS: 200 10*3/uL (ref 150–400)
Platelets: 227 10*3/uL (ref 150–400)
RBC: 4 MIL/uL (ref 3.87–5.11)
RBC: 4.06 MIL/uL (ref 3.87–5.11)
RDW: 12.5 % (ref 11.5–15.5)
RDW: 12.5 % (ref 11.5–15.5)
WBC: 5.7 10*3/uL (ref 4.0–10.5)
WBC: 6.6 10*3/uL (ref 4.0–10.5)

## 2014-02-08 LAB — GLUCOSE, CAPILLARY
GLUCOSE-CAPILLARY: 244 mg/dL — AB (ref 70–99)
Glucose-Capillary: 215 mg/dL — ABNORMAL HIGH (ref 70–99)
Glucose-Capillary: 354 mg/dL — ABNORMAL HIGH (ref 70–99)

## 2014-02-08 LAB — BASIC METABOLIC PANEL
ANION GAP: 7 (ref 5–15)
BUN: 7 mg/dL (ref 6–23)
CO2: 32 mmol/L (ref 19–32)
Calcium: 9.1 mg/dL (ref 8.4–10.5)
Chloride: 97 mEq/L (ref 96–112)
Creatinine, Ser: 0.41 mg/dL — ABNORMAL LOW (ref 0.50–1.10)
GFR calc non Af Amer: >90 mL/min (ref >90)
Glucose, Bld: 207 mg/dL — ABNORMAL HIGH (ref 70–99)
POTASSIUM: 3.8 mmol/L (ref 3.5–5.1)
Sodium: 136 mmol/L (ref 135–145)

## 2014-02-08 LAB — BRAIN NATRIURETIC PEPTIDE: B Natriuretic Peptide: 20 pg/mL (ref 0.0–100.0)

## 2014-02-08 LAB — CREATININE, SERUM
CREATININE: 0.52 mg/dL (ref 0.50–1.10)
GFR calc Af Amer: >90 mL/min (ref >90)
GFR calc non Af Amer: >90 mL/min (ref >90)

## 2014-02-08 MED ORDER — MONTELUKAST SODIUM 10 MG PO TABS
10.0000 mg | ORAL_TABLET | Freq: Every day | ORAL | Status: DC
  Administered 2014-02-08 – 2014-02-11 (×4): 10 mg via ORAL
  Filled 2014-02-08 (×4): qty 1

## 2014-02-08 MED ORDER — ALUM & MAG HYDROXIDE-SIMETH 200-200-20 MG/5ML PO SUSP: 30.0000 mL | Freq: Four times a day (QID) | ORAL | Status: DC | PRN

## 2014-02-08 MED ORDER — ONDANSETRON HCL 4 MG PO TABS: 4.0000 mg | ORAL_TABLET | Freq: Four times a day (QID) | ORAL | Status: DC | PRN

## 2014-02-08 MED ORDER — INSULIN ASPART 100 UNIT/ML ~~LOC~~ SOLN
0.0000 [IU] | Freq: Every day | SUBCUTANEOUS | Status: DC
  Administered 2014-02-08: 5 [IU] via SUBCUTANEOUS
  Administered 2014-02-10: 2 [IU] via SUBCUTANEOUS

## 2014-02-08 MED ORDER — POLYETHYLENE GLYCOL 3350 17 G PO PACK
17.0000 g | PACK | Freq: Every day | ORAL | Status: DC | PRN
  Filled 2014-02-08: qty 1

## 2014-02-08 MED ORDER — IPRATROPIUM-ALBUTEROL 0.5-2.5 (3) MG/3ML IN SOLN
3.0000 mL | Freq: Four times a day (QID) | RESPIRATORY_TRACT | Status: DC
  Administered 2014-02-08 – 2014-02-09 (×4): 3 mL via RESPIRATORY_TRACT
  Filled 2014-02-08 (×4): qty 3

## 2014-02-08 MED ORDER — PRAVASTATIN SODIUM 80 MG PO TABS
80.0000 mg | ORAL_TABLET | Freq: Every evening | ORAL | Status: DC
  Administered 2014-02-08 – 2014-02-11 (×4): 80 mg via ORAL
  Filled 2014-02-08 (×4): qty 1

## 2014-02-08 MED ORDER — PREDNISONE 20 MG PO TABS: 60.0000 mg | ORAL_TABLET | Freq: Every day | ORAL | Status: DC

## 2014-02-08 MED ORDER — ONDANSETRON HCL 4 MG/2ML IJ SOLN
4.0000 mg | Freq: Four times a day (QID) | INTRAMUSCULAR | Status: DC | PRN
  Administered 2014-02-09: 4 mg via INTRAVENOUS
  Filled 2014-02-08: qty 2

## 2014-02-08 MED ORDER — ADULT MULTIVITAMIN W/MINERALS CH
1.0000 | ORAL_TABLET | Freq: Every day | ORAL | Status: DC
  Administered 2014-02-08 – 2014-02-11 (×4): 1 via ORAL
  Filled 2014-02-08 (×4): qty 1

## 2014-02-08 MED ORDER — ENSURE COMPLETE PO LIQD
237.0000 mL | Freq: Two times a day (BID) | ORAL | Status: DC
  Administered 2014-02-09 (×2): 237 mL via ORAL

## 2014-02-08 MED ORDER — CLOPIDOGREL BISULFATE 75 MG PO TABS
75.0000 mg | ORAL_TABLET | Freq: Every morning | ORAL | Status: DC
  Administered 2014-02-08 – 2014-02-11 (×4): 75 mg via ORAL
  Filled 2014-02-08 (×4): qty 1

## 2014-02-08 MED ORDER — AZITHROMYCIN 500 MG PO TABS
500.0000 mg | ORAL_TABLET | Freq: Every day | ORAL | Status: DC
  Administered 2014-02-08 – 2014-02-11 (×4): 500 mg via ORAL
  Filled 2014-02-08 (×4): qty 1

## 2014-02-08 MED ORDER — ACETAMINOPHEN 325 MG PO TABS: 650.0000 mg | ORAL_TABLET | Freq: Four times a day (QID) | ORAL | Status: DC | PRN

## 2014-02-08 MED ORDER — ALBUTEROL SULFATE (2.5 MG/3ML) 0.083% IN NEBU
2.5000 mg | INHALATION_SOLUTION | Freq: Once | RESPIRATORY_TRACT | Status: AC
Start: 2014-02-08 — End: 2014-02-08
  Administered 2014-02-08: 2.5 mg via RESPIRATORY_TRACT

## 2014-02-08 MED ORDER — ALBUTEROL SULFATE (2.5 MG/3ML) 0.083% IN NEBU: 2.5000 mg | INHALATION_SOLUTION | Freq: Four times a day (QID) | RESPIRATORY_TRACT | Status: DC

## 2014-02-08 MED ORDER — SODIUM CHLORIDE 0.9 % IV SOLN
INTRAVENOUS | Status: DC
  Administered 2014-02-08: 75 mL/h via INTRAVENOUS
  Administered 2014-02-09 – 2014-02-10 (×3): via INTRAVENOUS

## 2014-02-08 MED ORDER — ACETAMINOPHEN 650 MG RE SUPP: 650.0000 mg | Freq: Four times a day (QID) | RECTAL | Status: DC | PRN

## 2014-02-08 MED ORDER — LORAZEPAM 0.5 MG PO TABS
0.5000 mg | ORAL_TABLET | Freq: Two times a day (BID) | ORAL | Status: DC | PRN
  Administered 2014-02-10: 0.5 mg via ORAL
  Filled 2014-02-08 (×2): qty 1

## 2014-02-08 MED ORDER — INSULIN ASPART 100 UNIT/ML ~~LOC~~ SOLN
0.0000 [IU] | Freq: Three times a day (TID) | SUBCUTANEOUS | Status: DC
  Administered 2014-02-08 – 2014-02-09 (×3): 5 [IU] via SUBCUTANEOUS
  Administered 2014-02-10 (×2): 3 [IU] via SUBCUTANEOUS
  Administered 2014-02-11: 8 [IU] via SUBCUTANEOUS
  Administered 2014-02-11: 3 [IU] via SUBCUTANEOUS

## 2014-02-08 MED ORDER — ALBUTEROL SULFATE (2.5 MG/3ML) 0.083% IN NEBU
5.0000 mg | INHALATION_SOLUTION | Freq: Once | RESPIRATORY_TRACT | Status: AC
  Administered 2014-02-08: 5 mg via RESPIRATORY_TRACT
  Filled 2014-02-08: qty 6

## 2014-02-08 MED ORDER — ENOXAPARIN SODIUM 40 MG/0.4ML ~~LOC~~ SOLN
40.0000 mg | SUBCUTANEOUS | Status: DC
  Administered 2014-02-08 – 2014-02-11 (×4): 40 mg via SUBCUTANEOUS
  Filled 2014-02-08 (×4): qty 0.4

## 2014-02-08 MED ORDER — DM-GUAIFENESIN ER 30-600 MG PO TB12
1.0000 | ORAL_TABLET | Freq: Two times a day (BID) | ORAL | Status: DC
  Administered 2014-02-08 – 2014-02-11 (×6): 1 via ORAL
  Filled 2014-02-08 (×7): qty 1

## 2014-02-08 MED ORDER — METHYLPREDNISOLONE SODIUM SUCC 125 MG IJ SOLR
60.0000 mg | Freq: Two times a day (BID) | INTRAMUSCULAR | Status: DC
  Administered 2014-02-08 – 2014-02-10 (×4): 60 mg via INTRAVENOUS
  Filled 2014-02-08 (×3): qty 0.96
  Filled 2014-02-08 (×2): qty 2
  Filled 2014-02-08: qty 0.96

## 2014-02-08 MED ORDER — IRBESARTAN 75 MG PO TABS
75.0000 mg | ORAL_TABLET | Freq: Every day | ORAL | Status: DC
  Administered 2014-02-08 – 2014-02-11 (×4): 75 mg via ORAL
  Filled 2014-02-08 (×4): qty 1

## 2014-02-08 MED ORDER — IPRATROPIUM BROMIDE 0.02 % IN SOLN
0.5000 mg | Freq: Once | RESPIRATORY_TRACT | Status: AC
  Administered 2014-02-08: 0.5 mg via RESPIRATORY_TRACT
  Filled 2014-02-08: qty 2.5

## 2014-02-08 MED ORDER — ALBUTEROL SULFATE (2.5 MG/3ML) 0.083% IN NEBU
2.5000 mg | INHALATION_SOLUTION | RESPIRATORY_TRACT | Status: DC | PRN
  Administered 2014-02-09 – 2014-02-11 (×7): 2.5 mg via RESPIRATORY_TRACT
  Filled 2014-02-08 (×8): qty 3

## 2014-02-08 MED ORDER — IPRATROPIUM BROMIDE 0.02 % IN SOLN
0.5000 mg | Freq: Once | RESPIRATORY_TRACT | Status: AC
  Administered 2014-02-08: 0.5 mg via RESPIRATORY_TRACT

## 2014-02-08 NOTE — ED Notes (Signed)
Tried calling report to [place].

## 2014-02-08 NOTE — Progress Notes (Signed)
Judith Boyer [id number] Admission Data: 02/08/2014 4:05 PM Attending Provider: Debbe Odea, MD  ZJI:RCVE, Judith Sayre, MD Consults/ Treatment Team:    Judith Boyer is a 69 y.o. female patient admitted from ED awake, alert  & orientated  X 3,  Full Code, VSS - Blood pressure 137/65, pulse 116, temperature 98.3 F (36.8 C), temperature source Oral, resp. rate 18, SpO2 100 %., O2    3 L nasal cannular,no c/o chest pain, no distress noted. Tele # 2  IV site WDL:  Left A/C running NS. Allergies:   Allergies  Allergen Reactions  . Doxycycline Anaphylaxis  . Codeine Other (See Comments)    hyperactivity  . Lisinopril Cough  . Spiriva Handihaler [Tiotropium Bromide Monohydrate]     Rash and Urinary Retention  . Adhesive [Tape] Rash  . Dulera [Mometasone Furo-Formoterol Fum] Rash     Past Medical History  Diagnosis Date  . Myocardial infarction   . COPD (chronic obstructive pulmonary disease)   . Hypertension   . Coronary artery disease   . Shortness of breath   . Diabetes mellitus without complication     type 2     Pt orientation to unit, room and routine. Information packet given to patient/family and safety video watched.  Admission INP armband ID verified with patient/family, and in place. SR up x 2, fall risk assessment complete with Patient and family verbalizing understanding of risks associated with falls. Pt verbalizes an understanding of how to use the call bell and to call for help before getting out of bed.  Skin, clean-dry- intact without evidence of bruising, or skin tears.   No evidence of skin break down noted on exam.     Will cont to monitor and assist as needed.  Judith Points, RN 02/08/2014 4:05 PM

## 2014-02-08 NOTE — H&P (Signed)
[facility] History and Physical  Judith Boyer HQI:[id number] DOB: [birth year] DOA: 02/08/2014   PCP: Jenny Reichmann, MD    Chief Complaint: shortness of breath  HPI: Judith Boyer is a 69 y.o. female with Gold Stage 3 COPD, DM, HTN presents with 3 days of dyspnea and cough. She uses 2 L of O2 at home and is currently requiring 3 L. She follows with Dr Melvyn Novas.  She is bringing up scant amounts of yellow green sputum. No fever or chest pain. Currently no other complaints.   General: The patient denies anorexia, fever, weight loss Cardiac: Denies chest pain, syncope, palpitations, pedal edema  Respiratory: per HPI on home O2 for 2 yrs GI: Denies severe indigestion/heartburn, abdominal pain, nausea, vomiting, diarrhea and constipation GU: Denies hematuria, incontinence, dysuria  Musculoskeletal: Denies arthritis  Skin: Denies suspicious skin lesions Neurologic: Denies focal weakness or numbness, change in vision Psychiatry: Denies depression or anxiety.   Past Medical History  Diagnosis Date  . Myocardial infarction   . COPD (chronic obstructive pulmonary disease)   . Hypertension   . Coronary artery disease   . Shortness of breath   . Diabetes mellitus without complication     type 2    Past Surgical History  Procedure Laterality Date  . Abdominal hysterectomy    . Shoulder surgery    . Coronary stent placement    . Left heart catheterization with coronary angiogram N/A 02/09/2011    Procedure: LEFT HEART CATHETERIZATION WITH CORONARY ANGIOGRAM;  Surgeon: Birdie Riddle, MD;  Location: [facility];  Service: Cardiovascular;  Laterality: N/A;    Social History: does not smoke - stopped about 66yr ago. Does not drink alcohol Lives at home with son    Allergies  Allergen Reactions  . Doxycycline Anaphylaxis  . Codeine Other (See Comments)    hyperactivity  . Lisinopril Cough  . Spiriva Handihaler [Tiotropium Bromide Monohydrate]     Rash and Urinary Retention   . Adhesive [Tape] Rash  . Dulera [Mometasone Furo-Formoterol Fum] Rash    Family History  Problem Relation Age of Onset  . Heart disease Maternal Grandmother   . Brain cancer Brother   . Lung cancer Mother     smoked     Prior to Admission medications   Medication Sig Start Date End Date Taking? Authorizing Provider  albuterol (PROAIR HFA) 108 (90 BASE) MCG/ACT inhaler Inhale 2 puffs into the lungs every 4 (four) hours as needed for wheezing or shortness of breath. 01/14/14  Yes [name], MD  albuterol (PROVENTIL) (2.5 MG/3ML) 0.083% nebulizer solution Take 3 mLs (2.5 mg total) by nebulization every 4 (four) hours as needed for wheezing. 01/14/14  Yes [name], MD  clopidogrel (PLAVIX) 75 MG tablet Take 1 tablet (75 mg total) by mouth every morning. 06/28/13  Yes [name], MD  dextromethorphan-guaiFENesin [facility] DM) 30-600 MG per 12 hr tablet Take 1 tablet by mouth every 12 (twelve) hours as needed. For cough   Yes Historical Provider, MD  Fluticasone-Salmeterol (ADVAIR) 500-50 MCG/DOSE AEPB Inhale 1 puff into the lungs 2 (two) times daily. 01/14/14  Yes [name], MD  ipratropium-albuterol (DUONEB) 0.5-2.5 (3) MG/3ML SOLN Take four times daily 01/14/14  Yes [name], MD  Iron TABS Take 1 tablet by mouth daily.   Yes Historical Provider, MD  LORazepam (ATIVAN) 0.5 MG tablet Take 1 tablet (0.5 mg total) by mouth 2 (two) times daily as needed for anxiety. 02/27/13  Yes [name], MD  metFORMIN (GLUCOPHAGE) 1000 MG tablet Take 1 tablet (1,000 mg total) by mouth 2 (two) times daily. Take 1 tablet in the morning and 1 tablet in the evening 06/28/13  Yes [name], MD  montelukast (SINGULAIR) 10 MG tablet Take 1 tablet (10 mg total) by mouth daily. 06/28/13  Yes [name], MD  Multiple Vitamin (MULTIVITAMIN WITH MINERALS) TABS tablet Take 1 tablet by mouth daily.   Yes Historical Provider, MD  NEEDLE, DISP, 30 G (BD DISP NEEDLES) 30G X 1/2" MISC Inject 1 mL  B-12 subcutaneously once a month 06/28/13  Yes [name], MD  nitroGLYCERIN (NITROSTAT) 0.4 MG SL tablet Place 1 tablet (0.4 mg total) under the tongue every 5 (five) minutes x 3 doses as needed for chest pain. 02/07/12  Yes Birdie Riddle, MD  pravastatin (PRAVACHOL) 80 MG tablet Take 1 tablet (80 mg total) by mouth every evening. 06/28/13  Yes [name], MD  valsartan (DIOVAN) 80 MG tablet Take 1 tablet (80 mg total) by mouth daily. 02/10/13  Yes Tanda Rockers, MD  cyanocobalamin (,VITAMIN B-12,) 1000 MCG/ML injection 1 cc every 2 weeks Patient not taking: Reported on 02/08/2014 06/28/13   [name], MD  predniSONE (DELTASONE) 10 MG tablet Take 4 day for 3 days 3 day for 3 days 2 a day for 3 days one a day for 3 days start on 01/15/14 Patient not taking: Reported on 02/08/2014 01/14/14   [name], MD  Respiratory Therapy Supplies (FLUTTER) DEVI Use as directed 01/13/13   Tanda Rockers, MD  Syringe, Disposable, 3 ML MISC Inject 1 mL B-12 subcutaneously once a month 04/23/13   [name], MD     Physical Exam: Filed Vitals:   02/08/14 1215 02/08/14 1245 02/08/14 1300 02/08/14 1315  BP: 180/70 146/64 168/94 131/64  Pulse: 114 118 115 110  Temp:      TempSrc:      Resp: 21 25 23 22   SpO2: 93% 98% 100% 98%     General:AAO x3, no acute distress HEENT: Normocephalic and Atraumatic, Mucous membranes pink                PERRLA; EOM intact; No scleral icterus,                 Nares: Patent, Oropharynx: Clear, Fair Dentition                 Neck: FROM, no cervical lymphadenopathy, thyromegaly, carotid bruit or JVD;  Breasts: deferred CHEST WALL: No tenderness  CHEST: Normal respiration- very poor air movement, no tachypnea, able to speak in full sentences HEART: Regular rate and rhythm; no murmurs rubs or gallops - tachycardic in low 100s BACK: No kyphosis or scoliosis; no CVA tenderness  ABDOMEN: Positive Bowel Sounds, soft, non-tender; no masses, no organomegaly Rectal Exam:  deferred EXTREMITIES: No cyanosis, clubbing, or edema Genitalia: not examined  SKIN:  no rash or ulceration  CNS: Alert and Oriented x 4, Nonfocal exam, CN 2-12 intact  Labs on Admission:  Basic Metabolic Panel:  Recent Labs Lab 02/08/14 1154  NA 136  K 3.8  CL 97  CO2 32  GLUCOSE 207*  BUN 7  CREATININE 0.41*  CALCIUM 9.1   Liver Function Tests: No results for input(s): AST, ALT, ALKPHOS, BILITOT, PROT, ALBUMIN in the last 168 hours. No results for input(s): LIPASE, AMYLASE in the last 168 hours. No results for input(s):  AMMONIA in the last 168 hours. CBC:  Recent Labs Lab 02/08/14 1154  WBC 5.7  HGB 11.8*  HCT 36.8  MCV 92.0  PLT 200   Cardiac Enzymes: No results for input(s): CKTOTAL, CKMB, CKMBINDEX, TROPONINI in the last 168 hours.  BNP (last 3 results)  Recent Labs  02/20/13 1618  PROBNP 25.3   CBG: No results for input(s): GLUCAP in the last 168 hours.  Radiological Exams on Admission: Dg Chest 2 View  02/08/2014   CLINICAL DATA:  Shortness of breath for a few days.  COPD.  EXAM: CHEST  2 VIEW  COMPARISON:  Portable film earlier today.  FINDINGS: Hyperinflation without focal infiltrate. Cardiomediastinal silhouette is unremarkable and normal. Skeletal osteopenia without acute osseous findings. Vascular calcification.  IMPRESSION: Hyperinflation.  No active disease.   Electronically Signed   By: Rolla Flatten M.D.   On: 02/08/2014 13:06   Dg Chest 2 View  02/08/2014   CLINICAL DATA:  Shortness of breath; COPD and history of heavy tobacco use in the past now discontinued  EXAM: CHEST  2 VIEW  COMPARISON:  PA and lateral chest x-ray of February 08, 2014  FINDINGS: The lungs are hyperinflated. There is no focal infiltrate. The heart and pulmonary vascularity are normal. The mediastinum is normal in width. There is no pleural effusion. The bony thorax is unremarkable.  IMPRESSION: COPD. There is no evidence of pneumonia nor other acute cardiopulmonary abnormality.    Electronically Signed   By: David  Martinique   On: 02/08/2014 11:08    EKG: Independently reviewed. Sinus tach at 109 bpm  Assessment/Plan Principal Problem:   Acute respiratory distress/  COPD exacerbation/  COPD GOLD III - wean O2 as able-  - received Solumedrol - will continue at 60 BID - cont Duonebs and add PRN Albuterol - add Azithromycin for acute bronchitis  Active Problems:    Hypertension - cont ARB    Diabetes mellitus type 2, controlled, without complications - sliding scale insulin ordered    Consulted: none  Code Status: full code Family Communication: son at bedside  DVT Prophylaxis:Lovenox  Time spent: 73 min [place], MD [facility]  If 7PM-7AM, please contact night-coverage [email or web address] 02/08/2014, 2:16 PM

## 2014-02-08 NOTE — ED Notes (Signed)
CBG 216 

## 2014-02-08 NOTE — ED Provider Notes (Signed)
CSN: [id number]     Arrival date & time 02/08/14  1129 History   First MD Initiated Contact with Patient 02/08/14 1141     Chief Complaint  Patient presents with  . Shortness of Breath     (Consider location/radiation/quality/duration/timing/severity/associated sxs/prior Treatment) HPI Comments: Patient is a 69 year old female with history of COPD and coronary artery disease. She presents for evaluation of difficulty breathing that has worsened over the past several days. She reports a cough that is intermittently productive. She denies fevers or chills. She does report being exposed to others who have been ill with upper respiratory infections.  She was seen at [facility] urgent care and sent here for further evaluation due to respiratory distress. She was given IV Solu-Medrol and a nebulizer treatment in route. She is feeling slightly improved, however remains short of breath.  Patient is a 69 y.o. female presenting with shortness of breath. The history is provided by the patient.  Shortness of Breath Severity:  Moderate Onset quality:  Sudden Duration:  2 days Timing:  Constant Progression:  Worsening Chronicity:  Recurrent Context: activity and URI   Relieved by:  Nothing Worsened by:  Activity Ineffective treatments:  Oxygen   Past Medical History  Diagnosis Date  . Myocardial infarction   . COPD (chronic obstructive pulmonary disease)   . Hypertension   . Coronary artery disease   . Shortness of breath   . Diabetes mellitus without complication     type 2   Past Surgical History  Procedure Laterality Date  . Abdominal hysterectomy    . Shoulder surgery    . Coronary stent placement    . Left heart catheterization with coronary angiogram N/A 02/09/2011    Procedure: LEFT HEART CATHETERIZATION WITH CORONARY ANGIOGRAM;  Surgeon: Birdie Riddle, MD;  Location: [facility] CATH [facility];  Service: Cardiovascular;  Laterality: N/A;   Family History  Problem Relation Age of Onset  . Heart  disease Maternal Grandmother   . Brain cancer Brother   . Lung cancer Mother     smoked   History  Substance Use Topics  . Smoking status: Former Smoker -- 0.50 packs/day for 50 years    Types: Cigarettes    Quit date: 01/10/2013  . Smokeless tobacco: Never Used  . Alcohol Use: No   OB History    No data available     Review of Systems  Respiratory: Positive for shortness of breath.   All other systems reviewed and are negative.     Allergies  Doxycycline; Codeine; Lisinopril; Spiriva handihaler; Adhesive; and Dulera  Home Medications   Prior to Admission medications   Medication Sig Start Date End Date Taking? Authorizing Provider  albuterol (PROAIR HFA) 108 (90 BASE) MCG/ACT inhaler Inhale 2 puffs into the lungs every 4 (four) hours as needed for wheezing or shortness of breath. 01/14/14   [name], MD  albuterol (PROVENTIL) (2.5 MG/3ML) 0.083% nebulizer solution Take 3 mLs (2.5 mg total) by nebulization every 4 (four) hours as needed for wheezing. 01/14/14   [name], MD  clopidogrel (PLAVIX) 75 MG tablet Take 1 tablet (75 mg total) by mouth every morning. 06/28/13   [name], MD  cyanocobalamin (,VITAMIN B-12,) 1000 MCG/ML injection 1 cc every 2 weeks 06/28/13   [name], MD  dextromethorphan-guaiFENesin [facility] DM) 30-600 MG per 12 hr tablet Take 1 tablet by mouth every 12 (twelve) hours as needed. For cough    Historical Provider, MD  Fluticasone-Salmeterol (ADVAIR)  500-50 MCG/DOSE AEPB Inhale 1 puff into the lungs 2 (two) times daily. 01/14/14   [name], MD  ipratropium-albuterol (DUONEB) 0.5-2.5 (3) MG/3ML SOLN Take four times daily 01/14/14   [name], MD  Iron TABS Take 1 tablet by mouth daily.    Historical Provider, MD  LORazepam (ATIVAN) 0.5 MG tablet Take 1 tablet (0.5 mg total) by mouth 2 (two) times daily as needed for anxiety. 02/27/13   [name], MD  metFORMIN (GLUCOPHAGE) 1000 MG tablet Take 1 tablet (1,000 mg total) by  mouth 2 (two) times daily. Take 1 tablet in the morning and 1 tablet in the evening 06/28/13   [name], MD  montelukast (SINGULAIR) 10 MG tablet Take 1 tablet (10 mg total) by mouth daily. 06/28/13   [name], MD  NEEDLE, DISP, 30 G (BD DISP NEEDLES) 30G X 1/2" MISC Inject 1 mL B-12 subcutaneously once a month 06/28/13   [name], MD  nitroGLYCERIN (NITROSTAT) 0.4 MG SL tablet Place 1 tablet (0.4 mg total) under the tongue every 5 (five) minutes x 3 doses as needed for chest pain. 02/07/12   Birdie Riddle, MD  pravastatin (PRAVACHOL) 80 MG tablet Take 1 tablet (80 mg total) by mouth every evening. 06/28/13   [name], MD  predniSONE (DELTASONE) 10 MG tablet Take 4 day for 3 days 3 day for 3 days 2 a day for 3 days one a day for 3 days start on 01/15/14 01/14/14   [name], MD  Respiratory Therapy Supplies (FLUTTER) DEVI Use as directed 01/13/13   Tanda Rockers, MD  Syringe, Disposable, 3 ML MISC Inject 1 mL B-12 subcutaneously once a month 04/23/13   [name], MD  valsartan (DIOVAN) 80 MG tablet Take 1 tablet (80 mg total) by mouth daily. 02/10/13   Tanda Rockers, MD   BP 167/84 mmHg  Pulse 112  Temp(Src) 98 F (36.7 C) (Oral)  Resp 26  SpO2 100% Physical Exam  Constitutional: She is oriented to person, place, and time. She appears well-developed and well-nourished. No distress.  HENT:  Head: Normocephalic and atraumatic.  Neck: Normal range of motion. Neck supple.  Cardiovascular: Normal rate and regular rhythm.  Exam reveals no gallop and no friction rub.   No murmur heard. Pulmonary/Chest: Effort normal. No respiratory distress. She has wheezes.  There are bilateral rhonchi with decreased air movement bilaterally.  Abdominal: Soft. Bowel sounds are normal. She exhibits no distension. There is no tenderness.  Musculoskeletal: Normal range of motion. She exhibits no edema.  Neurological: She is alert and oriented to person, place, and time.  Skin: Skin is warm and  dry. She is not diaphoretic.  Nursing note and vitals reviewed.   ED Course  Procedures (including critical care time) Labs Review Labs Reviewed  [place], ED    Imaging Review Dg Chest 2 View  02/08/2014   CLINICAL DATA:  Shortness of breath; COPD and history of heavy tobacco use in the past now discontinued  EXAM: CHEST  2 VIEW  COMPARISON:  PA and lateral chest x-ray of February 08, 2014  FINDINGS: The lungs are hyperinflated. There is no focal infiltrate. The heart and pulmonary vascularity are normal. The mediastinum is normal in width. There is no pleural effusion. The bony thorax is unremarkable.  IMPRESSION: COPD. There is no evidence of pneumonia nor other acute cardiopulmonary abnormality.  Electronically Signed   By: David  Martinique   On: 02/08/2014 11:08     EKG Interpretation   Date/Time:  Monday February 08 2014 11:37:13 EST Ventricular Rate:  109 PR Interval:  136 QRS Duration: 74 QT Interval:  328 QTC Calculation: 441 R Axis:   87 Text Interpretation:  Sinus tachycardia Otherwise normal ECG Confirmed by  DELOS  MD, Farid Grigorian ([id number]) on 02/08/2014 12:06:57 PM      MDM   Final diagnoses:  SOB (shortness of breath)    Patient presents with difficulty breathing. She was given nebulizer treatments and steroids by EMS. She was given additional nebulizer treatments in the ER, however continues to wheeze. She is in mild respiratory distress. She will be admitted to the hospitalist service for further treatment.  CRITICAL CARE Performed by: Veryl Speak Total critical care time: 30 minutes Critical care time was exclusive of separately billable procedures and treating other patients. Critical care was necessary to treat or prevent imminent or life-threatening deterioration. Critical care was time spent personally by me on the following activities: development of treatment plan with patient and/or surrogate as  well as nursing, discussions with consultants, evaluation of patient's response to treatment, examination of patient, obtaining history from patient or surrogate, ordering and performing treatments and interventions, ordering and review of laboratory studies, ordering and review of radiographic studies, pulse oximetry and re-evaluation of patient's condition.     Veryl Speak, MD 02/10/14 [phone]

## 2014-02-08 NOTE — ED Notes (Signed)
Tried calling report.

## 2014-02-08 NOTE — ED Notes (Signed)
Patient transported to X-ray 

## 2014-02-08 NOTE — Progress Notes (Addendum)
   Subjective:  This chart was scribed for Nena Jordan, MD by Dellis Filbert, ED Scribe at [facility].The patient was seen in exam room 07 and the patient's care was started at 10:15 AM.   Patient ID: Judith Boyer, female    DOB: [birth year], 69 y.o.   MRN: [id number] Chief Complaint  Patient presents with  . Shortness of Breath   HPI  HPI Comments: Judith Boyer is a 69 y.o. female with severe oxygen dependent COPD who presents to [facility] complaining of severe SOB for the last 48-72 hours. Pt believes she caught a respiratory infection from her son one week ago. Since contact she has progressively worsening SOB and dyspnea on exertion. Pt has pain at the mid portion of her back. She denies substernal CP. She took a taper dose of prednisone in December and hs been off the medication for approximately 2 weeks.  Review of Systems  Respiratory: Positive for shortness of breath.        Dyspnea  Cardiovascular: Negative for chest pain.  Musculoskeletal: Positive for back pain.       Objective:  BP 156/58 mmHg  Pulse 123  Temp(Src) 98 F (36.7 C)  Resp 48  SpO2 93%  Physical Exam  Constitutional: She is oriented to person, place, and time. She appears well-developed and well-nourished. No distress.  HENT:  Head: Normocephalic and atraumatic.  Eyes: EOM are normal.  Neck: Normal range of motion.  Cardiovascular: Normal rate.   No murmur heard. Regular reate without murmur.  Pulmonary/Chest: Effort normal.  Musculoskeletal: She exhibits no edema.  No edema in extremities.   Neurological: She is alert and oriented to person, place, and time. No cranial nerve deficit. She exhibits normal muscle tone. Coordination normal.  Skin: Skin is warm and dry.  Psychiatric: She has a normal mood and affect. Her behavior is normal.  Nursing note and vitals reviewed. [facility] reading (PRIMARY) by  Dr.Saahil Herbster severe COPD no pneumothorax seen  Pt is extremely tachypnea with  respiratory rate of 48 pulse-ox 93 on oxygen.    Assessment & Plan:  Pt wit sever COPD oxygen dependent. Who presents urgently with worsened SOB. Initially her oxygen rate was 48 with a pulse- ox of 93. She was immediatly  Treated  with duoneb with improvement. Pt will be transported to the hospital for further evaluation and possible short term stay due to her recent flare.I personally performed the services described in this documentation, which was scribed in my presence. The recorded information has been reviewed and is accurate.

## 2014-02-08 NOTE — ED Notes (Signed)
Pt arrived by [facility] from [facility]. Sent here due to resp distress, hx of copd. Pt reports feeling bad x 4-5 days with productive cough. Given breathing tx and solumedrol pta.

## 2014-02-09 DIAGNOSIS — E43 Unspecified severe protein-calorie malnutrition: Secondary | ICD-10-CM | POA: Diagnosis present

## 2014-02-09 LAB — CBC
HEMATOCRIT: 34 % — AB (ref 36.0–46.0)
HEMOGLOBIN: 10.7 g/dL — AB (ref 12.0–15.0)
MCH: 28.8 pg (ref 26.0–34.0)
MCHC: 31.5 g/dL (ref 30.0–36.0)
MCV: 91.4 fL (ref 78.0–100.0)
Platelets: 218 10*3/uL (ref 150–400)
RBC: 3.72 MIL/uL — AB (ref 3.87–5.11)
RDW: 12.5 % (ref 11.5–15.5)
WBC: 6 10*3/uL (ref 4.0–10.5)

## 2014-02-09 LAB — BASIC METABOLIC PANEL
Anion gap: 9 (ref 5–15)
BUN: 12 mg/dL (ref 6–23)
CHLORIDE: 99 meq/L (ref 96–112)
CO2: 30 mmol/L (ref 19–32)
Calcium: 8.9 mg/dL (ref 8.4–10.5)
Creatinine, Ser: 0.34 mg/dL — ABNORMAL LOW (ref 0.50–1.10)
GFR calc non Af Amer: >90 mL/min (ref >90)
Glucose, Bld: 208 mg/dL — ABNORMAL HIGH (ref 70–99)
Potassium: 4.3 mmol/L (ref 3.5–5.1)
SODIUM: 138 mmol/L (ref 135–145)

## 2014-02-09 LAB — GLUCOSE, CAPILLARY
GLUCOSE-CAPILLARY: 119 mg/dL — AB (ref 70–99)
Glucose-Capillary: 207 mg/dL — ABNORMAL HIGH (ref 70–99)
Glucose-Capillary: 235 mg/dL — ABNORMAL HIGH (ref 70–99)

## 2014-02-09 MED ORDER — GLUCERNA SHAKE PO LIQD
237.0000 mL | Freq: Two times a day (BID) | ORAL | Status: DC
  Administered 2014-02-10 – 2014-02-11 (×3): 237 mL via ORAL

## 2014-02-09 MED ORDER — IPRATROPIUM BROMIDE 0.02 % IN SOLN
0.5000 mg | Freq: Four times a day (QID) | RESPIRATORY_TRACT | Status: DC
  Administered 2014-02-09 – 2014-02-11 (×9): 0.5 mg via RESPIRATORY_TRACT
  Filled 2014-02-09 (×9): qty 2.5

## 2014-02-09 MED ORDER — LEVALBUTEROL HCL 0.63 MG/3ML IN NEBU
0.6300 mg | INHALATION_SOLUTION | Freq: Four times a day (QID) | RESPIRATORY_TRACT | Status: DC
  Administered 2014-02-09 – 2014-02-11 (×9): 0.63 mg via RESPIRATORY_TRACT
  Filled 2014-02-09 (×17): qty 3

## 2014-02-09 NOTE — Progress Notes (Signed)
PROGRESS NOTE  Judith Boyer ENI:[id number] DOB: [birth year] DOA: 02/08/2014 PCP: Jenny Reichmann, MD  HPI: Judith Boyer is a 69 y.o. female with Gold Stage 3 COPD, DM, HTN presents with 3 days of dyspnea and cough. She uses 2 L of O2 at home and is currently requiring 3 L. She follows with Dr Melvyn Novas. She is bringing up scant amounts of yellow green sputum. No fever or chest pain. Currently no other complaints.   Subjective / 24 H Interval events States that she feels a little bit better this morning, however not much improved since last night.  Assessment/Plan: Principal Problem:   Acute respiratory distress Active Problems:   COPD GOLD III   Hypertension   Diabetes mellitus type 2, controlled, without complications   COPD exacerbation   Acute respiratory failure with hypoxia withCOPD exacerbation on baseline COPD GOLD III on chronic home O2 of 3L [place] - wean O2 as able - received Solumedrol - will continue at 60 BID - cont Duonebs and add PRN Albuterol - continue Azithromycin for acute bronchitis  Hypertension- cont ARB  Diabetes mellitus type 2, controlled, without complications - sliding scale insulin ordered  HLD - continue statin  Diet: Diet heart healthy/carb modified Fluids: none  DVT Prophylaxis: Lovenox  Code Status: Full Code Family Communication: d/w patient  Disposition Plan: home when ready   Consultants:  None   Procedures:  None    Antibiotics  Anti-infectives    Start     Dose/Rate Route Frequency Ordered Stop   02/08/14 1430  azithromycin (ZITHROMAX) tablet 500 mg     500 mg Oral Daily 02/08/14 1423         Studies  Dg Chest 2 View  02/08/2014   CLINICAL DATA:  Shortness of breath for a few days.  COPD.  EXAM: CHEST  2 VIEW  COMPARISON:  Portable film earlier today.  FINDINGS: Hyperinflation without focal infiltrate. Cardiomediastinal silhouette is unremarkable and normal. Skeletal osteopenia without acute osseous findings.  Vascular calcification.  IMPRESSION: Hyperinflation.  No active disease.   Electronically Signed   By: Rolla Flatten M.D.   On: 02/08/2014 13:06   Dg Chest 2 View  02/08/2014   CLINICAL DATA:  Shortness of breath; COPD and history of heavy tobacco use in the past now discontinued  EXAM: CHEST  2 VIEW  COMPARISON:  PA and lateral chest x-ray of February 08, 2014  FINDINGS: The lungs are hyperinflated. There is no focal infiltrate. The heart and pulmonary vascularity are normal. The mediastinum is normal in width. There is no pleural effusion. The bony thorax is unremarkable.  IMPRESSION: COPD. There is no evidence of pneumonia nor other acute cardiopulmonary abnormality.   Electronically Signed   By: David  Martinique   On: 02/08/2014 11:08    Objective  Filed Vitals:   02/08/14 2129 02/09/14 0208 02/09/14 0447 02/09/14 0502  BP: 134/57  154/68   Pulse: 111  122   Temp: 98 F (36.7 C)  98.5 F (36.9 C)   TempSrc: Oral  Oral   Resp: 18  18   Height:      Weight:      SpO2:  98% 98% 98%    Intake/Output Summary (Last 24 hours) at 02/09/14 0717 Last data filed at 02/09/14 0446  Gross per 24 hour  Intake 1801.25 ml  Output      0 ml  Net 1801.25 ml   Filed Weights   02/08/14 1847  Weight: 44.453  kg (98 lb)    Exam:  General:  NAD, frail   HEENT: no scleral icterus  Cardiovascular: regular, no murmurs  Respiratory: decreased breath sounds overall, no wheezing appreciated  Abdomen: soft, non tender  MSK/Extremities: no clubbing/cyanosis  Skin: no rashes  Neuro: non focal   Data Reviewed: Basic Metabolic Panel:  Recent Labs Lab 02/08/14 1154 02/08/14 1500 02/09/14 0237  NA 136  --  138  K 3.8  --  4.3  CL 97  --  99  CO2 32  --  30  GLUCOSE 207*  --  208*  BUN 7  --  12  CREATININE 0.41* 0.52 0.34*  CALCIUM 9.1  --  8.9   CBC:  Recent Labs Lab 02/08/14 1154 02/08/14 1500 02/09/14 0237  WBC 5.7 6.6 6.0  HGB 11.8* 11.8* 10.7*  HCT 36.8 37.3 34.0*  MCV 92.0  91.9 91.4  PLT 200 227 218   BNP (last 3 results)  Recent Labs  02/20/13 1618  PROBNP 25.3   CBG:  Recent Labs Lab 02/08/14 1503 02/08/14 1652 02/08/14 1948 02/08/14 2126  GLUCAP 216* 244* 215* 354*   Scheduled Meds: . azithromycin  500 mg Oral Daily  . clopidogrel  75 mg Oral q morning - 10a  . dextromethorphan-guaiFENesin  1 tablet Oral BID  . enoxaparin (LOVENOX) injection  40 mg Subcutaneous Q24H  . feeding supplement (ENSURE COMPLETE)  237 mL Oral BID BM  . insulin aspart  0-15 Units Subcutaneous TID WC  . insulin aspart  0-5 Units Subcutaneous QHS  . ipratropium-albuterol  3 mL Nebulization Q6H  . irbesartan  75 mg Oral Daily  . methylPREDNISolone (SOLU-MEDROL) injection  60 mg Intravenous Q12H  . montelukast  10 mg Oral Daily  . multivitamin with minerals  1 tablet Oral Daily  . pravastatin  80 mg Oral QPM   Continuous Infusions: . sodium chloride 75 mL/hr at 02/09/14 0447    Marzetta Board, MD [facility] Pager [phone]. If 7 PM - 7 AM, please contact night-coverage at [email or web address], password [facility] 02/09/2014, 7:17 AM  LOS: 1 day

## 2014-02-09 NOTE — Progress Notes (Signed)
Pt with tachycardia in 120's. 130's while ambulating to the bathroom. Gherghe MD made aware. Telephone order for one-time EKG. MD to adjust meds.

## 2014-02-09 NOTE — Progress Notes (Signed)
Utilization review completed. Paysley Poplar, RN, BSN. 

## 2014-02-09 NOTE — Progress Notes (Signed)
Pt c/o of vomiting and feeling something stuck in her throat after eating. Pt was able to speak. Did have some back pain but no c/o of heartburn, indigestion, or chest pain. Pt states she just felt like she needed to burp and get rid of what was stuck in her throat. After few sips of water, pt states she felt better and was able to eat 50% of her meal.

## 2014-02-09 NOTE — Progress Notes (Signed)
INITIAL NUTRITION ASSESSMENT  Pt meets criteria for SEVERE MALNUTRITION in the context of chronic illness as evidenced by severe fat and muscle mass loss.  DOCUMENTATION CODES Per approved criteria  -Severe malnutrition in the context of chronic illness   INTERVENTION: Discontinue Ensure.  Provide Glucerna Shake po BID, each supplement provides 220 kcal and 10 grams of protein.  Encourage adequate PO intake.   NUTRITION DIAGNOSIS: Increased nutrient needs related to chronic illness, COPD as evidenced by estimated nutrition needs.   Goal: Pt to meet >/= 90% of their estimated nutrition needs   Monitor:  PO intake, weight trends, labs, I/O's  Reason for Assessment: MST  69 y.o. female  Admitting Dx: Acute respiratory distress  ASSESSMENT: Pt with Gold Stage 3 COPD, DM, HTN presents with 3 days of dyspnea and cough.  Pt reports her appetite is just "ok". Meal completion has been 25-50%. Pt reports she was eating fine at home with 3 meals a day with at least 1 Glucerna Shake. Pt reports she has been gradually losing weight. She reports her usual body weight used to be 112 lbs, however it has been a couple of years. Current weight loss is not found significant. Pt has Ensure ordered and has been drinking them, however reports she would rather have Glucerna Shake. RD to modify orders. Pt was encouraged to eat her food at meals and to drink her supplements.   Nutrition Focused Physical Exam:  Subcutaneous Fat:  Orbital Region: N/A Upper Arm Region: Severe depletion Thoracic and Lumbar Region: WNL  Muscle:  Temple Region: N/A Clavicle Bone Region: Severe depletion Clavicle and Acromion Bone Region: Severe depletion Scapular Bone Region: N/A Dorsal Hand: N/A Patellar Region: Moderate depletion Anterior Thigh Region: Severe depletion Posterior Calf Region: Severe depletion  Edema: none  Labs: Low creatinine.  Height: Ht Readings from Last 1 Encounters:  02/08/14 5\' 1"   (1.549 m)    Weight: Wt Readings from Last 1 Encounters:  02/08/14 98 lb (44.453 kg)    Ideal Body Weight: 105 lbs  % Ideal Body Weight: 93%  Wt Readings from Last 10 Encounters:  02/08/14 98 lb (44.453 kg)  01/14/14 99 lb (44.906 kg)  06/28/13 102 lb (46.267 kg)  05/26/13 103 lb 12.8 oz (47.083 kg)  05/24/13 104 lb 3.2 oz (47.265 kg)  04/18/13 105 lb 2 oz (47.684 kg)  03/10/13 107 lb 3.2 oz (48.626 kg)  02/20/13 106 lb 4.2 oz (48.2 kg)  02/10/13 105 lb (47.628 kg)  01/27/13 104 lb 9.6 oz (47.446 kg)    Usual Body Weight: 105 lbs  % Usual Body Weight: 93%  BMI:  Body mass index is 18.53 kg/(m^2).  Estimated Nutritional Needs: Kcal: 1700-1900 Protein: 65-80 grams Fluid: 1.7 - 1.9 L/day  Skin: intact  Diet Order: Diet heart healthy/carb modified  EDUCATION NEEDS: -No education needs identified at this time   Intake/Output Summary (Last 24 hours) at 02/09/14 1446 Last data filed at 02/09/14 1127  Gross per 24 hour  Intake 2001.25 ml  Output      0 ml  Net 2001.25 ml    Last BM: 1/5  Labs:   Recent Labs Lab 02/08/14 1154 02/08/14 1500 02/09/14 0237  NA 136  --  138  K 3.8  --  4.3  CL 97  --  99  CO2 32  --  30  BUN 7  --  12  CREATININE 0.41* 0.52 0.34*  CALCIUM 9.1  --  8.9  GLUCOSE 207*  --  208*    CBG (last 3)   Recent Labs  02/08/14 2126 02/09/14 0741 02/09/14 1132  GLUCAP 354* 207* 119*    Scheduled Meds: . azithromycin  500 mg Oral Daily  . clopidogrel  75 mg Oral q morning - 10a  . dextromethorphan-guaiFENesin  1 tablet Oral BID  . enoxaparin (LOVENOX) injection  40 mg Subcutaneous Q24H  . feeding supplement (ENSURE COMPLETE)  237 mL Oral BID BM  . insulin aspart  0-15 Units Subcutaneous TID WC  . insulin aspart  0-5 Units Subcutaneous QHS  . ipratropium-albuterol  3 mL Nebulization Q6H  . irbesartan  75 mg Oral Daily  . methylPREDNISolone (SOLU-MEDROL) injection  60 mg Intravenous Q12H  . montelukast  10 mg Oral Daily   . multivitamin with minerals  1 tablet Oral Daily  . pravastatin  80 mg Oral QPM    Continuous Infusions: . sodium chloride 75 mL/hr at 02/09/14 0447    Past Medical History  Diagnosis Date  . COPD (chronic obstructive pulmonary disease)   . Hypertension   . Coronary artery disease   . Shortness of breath   . High cholesterol   . Myocardial infarction 02/2011  . On home oxygen therapy     "2L; 24h/day" (02/08/2014)  . Pneumonia     "couple times" (02/08/2014)  . Type II diabetes mellitus     type 2  . Anemia   . History of blood transfusion 2014    "extremely anemic"    Past Surgical History  Procedure Laterality Date  . Shoulder arthroscopy w/ rotator cuff repair Left   . Left heart catheterization with coronary angiogram N/A 02/09/2011    Procedure: LEFT HEART CATHETERIZATION WITH CORONARY ANGIOGRAM;  Surgeon: Birdie Riddle, MD;  Location: [place] [facility];  Service: Cardiovascular;  Laterality: N/A;  . Tonsillectomy    . Abdominal hysterectomy  1970's  . Coronary angioplasty with stent placement  12/2007    "2"  . Cardiac catheterization  02/2011    Kallie Locks, MS, RD, LDN Pager # [phone] After hours/ weekend pager # [phone]

## 2014-02-10 DIAGNOSIS — E43 Unspecified severe protein-calorie malnutrition: Secondary | ICD-10-CM

## 2014-02-10 DIAGNOSIS — E538 Deficiency of other specified B group vitamins: Secondary | ICD-10-CM | POA: Diagnosis present

## 2014-02-10 LAB — CBC
HCT: 34.5 % — ABNORMAL LOW (ref 36.0–46.0)
Hemoglobin: 10.8 g/dL — ABNORMAL LOW (ref 12.0–15.0)
MCH: 29.4 pg (ref 26.0–34.0)
MCHC: 31.3 g/dL (ref 30.0–36.0)
MCV: 94 fL (ref 78.0–100.0)
Platelets: 266 10*3/uL (ref 150–400)
RBC: 3.67 MIL/uL — ABNORMAL LOW (ref 3.87–5.11)
RDW: 12.3 % (ref 11.5–15.5)
WBC: 10.2 10*3/uL (ref 4.0–10.5)

## 2014-02-10 LAB — BLOOD GAS, ARTERIAL
ACID-BASE EXCESS: 8.5 mmol/L — AB (ref 0.0–2.0)
Bicarbonate: 34.1 mEq/L — ABNORMAL HIGH (ref 20.0–24.0)
DRAWN BY: 307971
O2 CONTENT: 3 L/min
O2 SAT: 98.7 %
PCO2 ART: 63.9 mmHg — AB (ref 35.0–45.0)
PH ART: 7.347 — AB (ref 7.350–7.450)
Patient temperature: 98.6
TCO2: 36.1 mmol/L (ref 0–100)
pO2, Arterial: 108 mmHg — ABNORMAL HIGH (ref 80.0–100.0)

## 2014-02-10 LAB — GLUCOSE, CAPILLARY
GLUCOSE-CAPILLARY: 112 mg/dL — AB (ref 70–99)
GLUCOSE-CAPILLARY: 161 mg/dL — AB (ref 70–99)
GLUCOSE-CAPILLARY: 179 mg/dL — AB (ref 70–99)
GLUCOSE-CAPILLARY: 239 mg/dL — AB (ref 70–99)
Glucose-Capillary: 168 mg/dL — ABNORMAL HIGH (ref 70–99)

## 2014-02-10 MED ORDER — METHYLPREDNISOLONE SODIUM SUCC 40 MG IJ SOLR
40.0000 mg | Freq: Two times a day (BID) | INTRAMUSCULAR | Status: DC
  Administered 2014-02-10 – 2014-02-11 (×3): 40 mg via INTRAVENOUS
  Filled 2014-02-10 (×4): qty 1

## 2014-02-10 MED ORDER — VITAMIN B-12 1000 MCG PO TABS
1000.0000 ug | ORAL_TABLET | Freq: Every day | ORAL | Status: DC
  Administered 2014-02-10 – 2014-02-11 (×2): 1000 ug via ORAL
  Filled 2014-02-10 (×2): qty 1

## 2014-02-10 NOTE — Progress Notes (Signed)
PROGRESS NOTE  Judith Boyer TKZ:[id number] DOB: November 26, 1945 DOA: 02/08/2014 PCP: Jenny Reichmann, MD  HPI/Recap of past 60 hours: 69 year old female with COPD Gold stage III on chronic oxygen-2 L, and hypertension who on 1/4 for several days of shortness of breath and cough. At time of admission, patient requiring 3 L. She was moved to the hospital service, started on steroids, antibiotics, nebulizers and additional oxygen.  Today, patient feeling better. Still some shortness of breath with wheezing. Feels very weak.  Assessment/Plan: Principal Problem: Acute respiratory failure secondary to COPD exacerbation: Improving. ABG on 3 L noted some PCO2 retention and a PO2 of greater than 100. Weaned down to 2 L. Tapering down steroids, continue antibiotics Active Problems:    Hypertension: Blood pressure overall stable   Diabetes mellitus type 2, controlled, without complications: CBGs mildly elevated ranging 160-180    Protein-calorie malnutrition, severe: Patient meets criteria for severe malnutrition in the context of chronic illness as evidenced by severe fat and muscle mass loss. Seen by nutrition, started on Glucerna shake twice a day  B-12 deficiency: Patient states she was on subcutaneous injections monthly, but has not been on them for some time due to insurance reasons. We'll start by mouth supplementation.  Code Status: Full code  Family Communication: Left message with family  Disposition Plan: Possibly home tomorrow   Consultants:   None  Procedures:   None  Antibiotics: Zithromax 1/4-present  Objective: BP 152/57 mmHg  Pulse 106  Temp(Src) 98.1 F (36.7 C) (Oral)  Resp 18  Ht 5\' 1"  (1.549 m)  Wt 45.3 kg (99 lb 13.9 oz)  BMI 18.88 kg/m2  SpO2 98%  Intake/Output Summary (Last 24 hours) at 02/10/14 1445 Last data filed at 02/10/14 0834  Gross per 24 hour  Intake 1917.5 ml  Output      0 ml  Net 1917.5 ml   Filed Weights   02/08/14 1847 02/10/14  0957  Weight: 44.453 kg (98 lb) 45.3 kg (99 lb 13.9 oz)    Exam:   General:   Alert and oriented 3, fatigue  Cardiovascular: regular rate and rhythm, S1-S2  Respiratory: decrease percent throughout, prolonged end expiratory sounds  Abdomen: soft, nontender, nondistended, positive bowel sounds  Musculoskeletal: no clubbing or cyanosis or edema   Data Reviewed: Basic Metabolic Panel:  Recent Labs Lab 02/08/14 1154 02/08/14 1500 02/09/14 0237  NA 136  --  138  K 3.8  --  4.3  CL 97  --  99  CO2 32  --  30  GLUCOSE 207*  --  208*  BUN 7  --  12  CREATININE 0.41* 0.52 0.34*  CALCIUM 9.1  --  8.9   Liver Function Tests: No results for input(s): AST, ALT, ALKPHOS, BILITOT, PROT, ALBUMIN in the last 168 hours. No results for input(s): LIPASE, AMYLASE in the last 168 hours. No results for input(s): AMMONIA in the last 168 hours. CBC:  Recent Labs Lab 02/08/14 1154 02/08/14 1500 02/09/14 0237 02/10/14 1122  WBC 5.7 6.6 6.0 10.2  HGB 11.8* 11.8* 10.7* 10.8*  HCT 36.8 37.3 34.0* 34.5*  MCV 92.0 91.9 91.4 94.0  PLT 200 227 218 266   Cardiac Enzymes:   No results for input(s): CKTOTAL, CKMB, CKMBINDEX, TROPONINI in the last 168 hours. BNP (last 3 results)  Recent Labs  02/20/13 1618  PROBNP 25.3   CBG:  Recent Labs Lab 02/09/14 1132 02/09/14 1651 02/09/14 2344 02/10/14 0733 02/10/14 1302  GLUCAP 119* 235* 161*  168* 179*    No results found for this or any previous visit (from the past 240 hour(s)).   Studies: No results found.  Scheduled Meds: . azithromycin  500 mg Oral Daily  . clopidogrel  75 mg Oral q morning - 10a  . dextromethorphan-guaiFENesin  1 tablet Oral BID  . enoxaparin (LOVENOX) injection  40 mg Subcutaneous Q24H  . feeding supplement (GLUCERNA SHAKE)  237 mL Oral BID BM  . insulin aspart  0-15 Units Subcutaneous TID WC  . insulin aspart  0-5 Units Subcutaneous QHS  . ipratropium  0.5 mg Nebulization Q6H  . irbesartan  75 mg Oral  Daily  . levalbuterol  0.63 mg Nebulization Q6H  . methylPREDNISolone (SOLU-MEDROL) injection  40 mg Intravenous Q12H  . montelukast  10 mg Oral Daily  . multivitamin with minerals  1 tablet Oral Daily  . pravastatin  80 mg Oral QPM    Continuous Infusions: . sodium chloride 10 mL/hr (02/10/14 0957)     Time spent: 25 minutes  [facility] Pager [phone]. If 7PM-7AM, please contact night-coverage at [email or web address], password [facility] 02/10/2014, 2:45 PM  LOS: 2 days

## 2014-02-10 NOTE — Evaluation (Signed)
Physical Therapy Evaluation Patient Details Name: Judith Boyer MRN: [id number] DOB: 1945/02/21 Today's Date: 02/10/2014   History of Present Illness  69 y.o. female with Gold Stage 3 COPD, DM, HTN presents with 3 days of dyspnea and cough.   Clinical Impression  Pt adm due to above. Pt presents with decreased independence with functional mobility secondary to deficits indicated below (see PT problem list). Pt to benefit from skilled acute PT to address deficits and maximize functional mobility. Patient needs to practice stairs next session prior to D/C home. Pt on 2L of O2 during session as instructed by Rn, pt c/o SOB throughout but O2 at 98%. HR reached 119 with activity.     Follow Up Recommendations Home health PT;Supervision - Intermittent    Equipment Recommendations  None recommended by PT    Recommendations for Other Services       Precautions / Restrictions Precautions Precautions: Fall Restrictions Weight Bearing Restrictions: No      Mobility  Bed Mobility               General bed mobility comments: pt sitting EOB and returned   Transfers Overall transfer level: Needs assistance Equipment used: None Transfers: Sit to/from Stand Sit to Stand: Supervision         General transfer comment: supervision for safety; slight sway  Ambulation/Gait Ambulation/Gait assistance: Min guard Ambulation Distance (Feet): 80 Feet Assistive device: None Gait Pattern/deviations: Step-through pattern;Decreased stride length;Staggering right;Staggering left;Narrow base of support Gait velocity: decreased Gait velocity interpretation: Below normal speed for age/gender General Gait Details: pt ambulating on 2L of O2 as instructed to do so by RN, pt c/o incr SOB rated 10/10 on BORG scale and required multiple standing rest breaks; pt swaying and staggering but did not have overt LOB; min guard to steady   Science writer    Modified  [place] (Stroke Patients Only)       Balance Overall balance assessment: Needs assistance Sitting-balance support: No upper extremity supported;Feet supported Sitting balance-Leahy Scale: Good                       High level balance activites: Direction changes;Turns High Level Balance Comments: min guard due to c/o lightheadedness and SOB             Pertinent Vitals/Pain Pain Assessment: No/denies pain    Home Living Family/patient expects to be discharged to:: Private residence Living Arrangements: Children Available Help at Discharge: Family;Available 24 hours/day Type of Home: House Home Access: Stairs to enter Entrance Stairs-Rails: Right Entrance Stairs-Number of Steps: 3 Home Layout: One level Home Equipment: Walker - 2 wheels;Shower seat;Tub bench Additional Comments: son stays with her at night     Prior Function Level of Independence: Independent         Comments: uses 2L O2      Hand Dominance        Extremity/Trunk Assessment   Upper Extremity Assessment: Defer to OT evaluation           Lower Extremity Assessment: Generalized weakness      Cervical / Trunk Assessment: Normal  Communication   Communication: No difficulties  Cognition Arousal/Alertness: Awake/alert Behavior During Therapy: WFL for tasks assessed/performed Overall Cognitive Status: Within Functional Limits for tasks assessed                      General Comments General  comments (skin integrity, edema, etc.): pt rated SOB 10/10; O2 after ambulating on 2L 98%    Exercises        Assessment/Plan    PT Assessment Patient needs continued PT services  PT Diagnosis Difficulty walking;Generalized weakness   PT Problem List Cardiopulmonary status limiting activity;Decreased balance;Decreased activity tolerance;Decreased strength  PT Treatment Interventions DME instruction;Gait training;Stair training;Functional mobility training;Therapeutic  activities;Therapeutic exercise;Balance training;Neuromuscular re-education;Patient/family education   PT Goals (Current goals can be found in the Care Plan section) Acute Rehab PT Goals Patient Stated Goal: to be breathing better PT Goal Formulation: With patient Time For Goal Achievement: 02/17/14 Potential to Achieve Goals: Good    Frequency Min 3X/week   Barriers to discharge Decreased caregiver support pt alone during the day    Co-evaluation               End of Session Equipment Utilized During Treatment: Gait belt;Oxygen Activity Tolerance: Patient limited by fatigue Patient left: in bed;with call bell/phone within reach;with nursing/sitter in room Nurse Communication: Mobility status;Other (comment) (O2)         Time: 8527-7824 PT Time Calculation (min) (ACUTE ONLY): 15 min   Charges:   PT Evaluation $Initial PT Evaluation Tier I: 1 Procedure PT Treatments $Gait Training: 8-22 mins   PT G CodesGustavus Bryant, [place] 02/10/2014, 11:25 AM

## 2014-02-10 NOTE — Progress Notes (Signed)
Call from Resp. Therapist with CO2 of 63.9. Maryland Pink MD paged and made aware.

## 2014-02-10 NOTE — Care Management Note (Signed)
    Page 1 of 1   02/11/2014     4:18:32 PM CARE MANAGEMENT NOTE 02/11/2014  Patient:  Judith Boyer, Judith Boyer   Account Number:  [id number]  Date Initiated:  02/10/2014  Documentation initiated by:  Tomi Bamberger  Subjective/Objective Assessment:   dx copd ex  admit- lives alone.     Action/Plan:   steroids, abx, nebs  pt eval-rec hhpt; pt refused   Anticipated DC Date:  02/11/2014   Anticipated DC Plan:  [place]  CM consult      [facility] Choice  HOME HEALTH   Choice offered to / List presented to:  C-1 Patient        Geraldine arranged  [place] - 11 Patient Refused      Status of service:  Completed, signed off Medicare Important Message given?  YES (If response is "NO", the following Medicare IM given date fields will be blank) Date Medicare IM given:  02/10/2014 Medicare IM given by:  Tomi Bamberger Date Additional Medicare IM given:   Additional Medicare IM given by:    Discharge Disposition:  HOME/SELF CARE  Per UR Regulation:  Reviewed for med. necessity/level of care/duration of stay  If discussed at [place] of Stay Meetings, dates discussed:    Comments:  02/11/14 [place], BSN [phone] patient for dc today, choice offered to patient for hhpt, she refused hhpt.  02/10/14 [place], BSN [phone] patient lives alone, await pt eval.

## 2014-02-10 NOTE — Progress Notes (Signed)
RN called into room by Resp. Therapist. Pt c/o trouble breathing. Neb treatment given without relief. Pt breathing fast. Ativan given po for anxiety. [place] Pink MD paged. Pt instructed to do pursed-lip breathing. Stats were high 70's-low 80's but steadily increased. Pt started to breath normally and stats were 98 on 2LNC. Pt finally calm at the moment. Pt stats she does get panic attacks once in a while. States she is feeling better. Will continue to monitor pt.

## 2014-02-11 LAB — BLOOD GAS, ARTERIAL
Acid-Base Excess: 9.5 mmol/L — ABNORMAL HIGH (ref 0.0–2.0)
BICARBONATE: 34.4 meq/L — AB (ref 20.0–24.0)
Drawn by: 307971
O2 Content: 2 L/min
O2 Saturation: 97.2 %
PCO2 ART: 55.3 mmHg — AB (ref 35.0–45.0)
PH ART: 7.41 (ref 7.350–7.450)
Patient temperature: 98.6
TCO2: 36.1 mmol/L (ref 0–100)
pO2, Arterial: 98.7 mmHg (ref 80.0–100.0)

## 2014-02-11 LAB — BASIC METABOLIC PANEL
Anion gap: 9 (ref 5–15)
BUN: 15 mg/dL (ref 6–23)
CALCIUM: 9 mg/dL (ref 8.4–10.5)
CO2: 33 mmol/L — AB (ref 19–32)
Chloride: 93 mEq/L — ABNORMAL LOW (ref 96–112)
Creatinine, Ser: 0.42 mg/dL — ABNORMAL LOW (ref 0.50–1.10)
GFR calc Af Amer: >90 mL/min (ref >90)
Glucose, Bld: 199 mg/dL — ABNORMAL HIGH (ref 70–99)
POTASSIUM: 3.9 mmol/L (ref 3.5–5.1)
SODIUM: 135 mmol/L (ref 135–145)

## 2014-02-11 LAB — BRAIN NATRIURETIC PEPTIDE: B Natriuretic Peptide: 107 pg/mL — ABNORMAL HIGH (ref 0.0–100.0)

## 2014-02-11 LAB — GLUCOSE, CAPILLARY
GLUCOSE-CAPILLARY: 259 mg/dL — AB (ref 70–99)
GLUCOSE-CAPILLARY: 86 mg/dL (ref 70–99)
Glucose-Capillary: 196 mg/dL — ABNORMAL HIGH (ref 70–99)

## 2014-02-11 MED ORDER — AZITHROMYCIN 500 MG PO TABS: 500.0000 mg | ORAL_TABLET | Freq: Every day | ORAL | Status: AC

## 2014-02-11 MED ORDER — GLUCERNA SHAKE PO LIQD: 237.0000 mL | Freq: Two times a day (BID) | ORAL | Status: DC

## 2014-02-11 MED ORDER — CYANOCOBALAMIN 1000 MCG PO TABS: 1000.0000 ug | ORAL_TABLET | Freq: Every day | ORAL | Status: DC

## 2014-02-11 MED ORDER — PREDNISONE 10 MG PO TABS: ORAL_TABLET | ORAL | Status: DC

## 2014-02-11 NOTE — Discharge Summary (Addendum)
Discharge Summary  Judith Boyer KZS:[id number] DOB: 09/06/1945  PCP: Jenny Reichmann, MD  Admit date: 02/08/2014 Discharge date: 02/11/2014  Time spent: 35 minutes  Recommendations for Outpatient Follow-up:  1. New medications: Zithromax 500 by mouth daily 3 days 2. New medications: Prednisone taper 3. New medication: B-12 1000 g by mouth daily 4. Patient offered home health PT which was recommended, she declined   Discharge Diagnoses:  Active Hospital Problems   Diagnosis Date Noted  . Acute respiratory distress 02/08/2014  . B12 deficiency 02/10/2014  . Protein-calorie malnutrition, severe 02/09/2014  . COPD exacerbation 02/20/2013  . COPD GOLD III 02/06/2011  . Diabetes mellitus type 2, controlled, without complications 73/22/0254  . Hypertension 02/06/2011    Resolved Hospital Problems   Diagnosis Date Noted Date Resolved  No resolved problems to display.    Discharge Condition: Improved, being discharged home  Diet recommendation: Carb modified, heart healthy with Glucerna shake by mouth twice a day  Filed Weights   02/08/14 1847 02/10/14 0957 02/11/14 0900  Weight: 44.453 kg (98 lb) 45.3 kg (99 lb 13.9 oz) 45.36 kg (100 lb)    History of present illness:  69 year old female with COPD Gold stage III on chronic oxygen-2 L, and hypertension who on 1/4 for several days of shortness of breath and cough. At time of admission, patient requiring 3 L. She was admitted to the hospitalist service, started on steroids, antibiotics, nebulizers and additional oxygen.  Hospital Course:  Principal Problem: Acute respiratory failure secondary to COPD exacerbation and COPD Gold patient: Patient initially started on 3 L nasal cannula. Given oxygen, steroids, nebulizers and antibiotics. ABG on 3 L some PCO2 retention and a PO2 greater than 100. Patient's oxygen was able to be weaned down to baseline 2 L which she still has oxygen saturations very well. Patient is to get quite  anxious, with nebulizer sometimes causing rebound tachycardia. Her breathing was otherwise felt to be stable and with ambulation on 2 L, she maintain oxygen saturations greater than 90%. Felt to medically stable for discharge, patient will be discharged home on several more days of Zithromax to complete a seven-day course, steroid taper plus her home nebulizers and inhalers. She'll follow-up with her PCP in the next 2 weeks.  Prior to discharge, BNP checked and found to be normal to ensure that she had no underlying cor pulmonale and volume overload. Active Problems:    Hypertension: Overall blood pressure stable.   Diabetes mellitus type 2, controlled, without complications: CBGs will be elevated secondary steroids, should improve as steroids are tapered off.    Protein-calorie malnutrition, severe: Patient is criteria for severe malnutrition in the context of chronic illnesses evidence by severe fat and muscle mass loss. Seen by nutrition started on Glucerna shake twice a day. Prescription given upon discharge.    B12 deficiency: Patient reports B-12 deficiency and stopped getting B-12 shots 2 months ago. Started on oral B-12, prescription given.   Procedures:  None  Consultations:  None  Discharge Exam: BP 152/62 mmHg  Pulse 105  Temp(Src) 98.1 F (36.7 C) (Oral)  Resp 18  Ht 5\' 1"  (1.549 m)  Wt 45.36 kg (100 lb)  BMI 18.90 kg/m2  SpO2 99%  General: Alert and oriented 3, no acute distress Cardiovascular: Regular rate and rhythm, borderline tachycardia Respiratory: Decreased breath sounds throughout  Discharge Instructions You were cared for by a hospitalist during your hospital stay. If you have any questions about your discharge medications or the care you  received while you were in the hospital after you are discharged, you can call the unit and asked to speak with the hospitalist on call if the hospitalist that took care of you is not available. Once you are discharged, your  primary care physician will handle any further medical issues. Please note that NO REFILLS for any discharge medications will be authorized once you are discharged, as it is imperative that you return to your primary care physician (or establish a relationship with a primary care physician if you do not have one) for your aftercare needs so that they can reassess your need for medications and monitor your lab values.  Discharge Instructions    Diet - low sodium heart healthy    Complete by:  As directed      Increase activity slowly    Complete by:  As directed             Medication List    STOP taking these medications        cyanocobalamin 1000 MCG/ML injection  Commonly known as:  (VITAMIN B-12)  Replaced by:  cyanocobalamin 1000 MCG tablet      TAKE these medications        albuterol 108 (90 BASE) MCG/ACT inhaler  Commonly known as:  PROAIR HFA  Inhale 2 puffs into the lungs every 4 (four) hours as needed for wheezing or shortness of breath.     albuterol (2.5 MG/3ML) 0.083% nebulizer solution  Commonly known as:  PROVENTIL  Take 3 mLs (2.5 mg total) by nebulization every 4 (four) hours as needed for wheezing.     azithromycin 500 MG tablet  Commonly known as:  ZITHROMAX  Take 1 tablet (500 mg total) by mouth daily.     clopidogrel 75 MG tablet  Commonly known as:  PLAVIX  Take 1 tablet (75 mg total) by mouth every morning.     cyanocobalamin 1000 MCG tablet  Take 1 tablet (1,000 mcg total) by mouth daily.     dextromethorphan-guaiFENesin 30-600 MG per 12 hr tablet  Commonly known as:  MUCINEX DM  Take 1 tablet by mouth every 12 (twelve) hours as needed. For cough     feeding supplement (GLUCERNA SHAKE) Liqd  Take 237 mLs by mouth 2 (two) times daily between meals.     Fluticasone-Salmeterol 500-50 MCG/DOSE Aepb  Commonly known as:  ADVAIR  Inhale 1 puff into the lungs 2 (two) times daily.     FLUTTER Devi  Use as directed     ipratropium-albuterol 0.5-2.5  (3) MG/3ML Soln  Commonly known as:  DUONEB  Take four times daily     Iron Tabs  Take 1 tablet by mouth daily.     LORazepam 0.5 MG tablet  Commonly known as:  ATIVAN  Take 1 tablet (0.5 mg total) by mouth 2 (two) times daily as needed for anxiety.     metFORMIN 1000 MG tablet  Commonly known as:  GLUCOPHAGE  Take 1 tablet (1,000 mg total) by mouth 2 (two) times daily. Take 1 tablet in the morning and 1 tablet in the evening     montelukast 10 MG tablet  Commonly known as:  SINGULAIR  Take 1 tablet (10 mg total) by mouth daily.     multivitamin with minerals Tabs tablet  Take 1 tablet by mouth daily.     NEEDLE (DISP) 30 G 30G X 1/2" Misc  Commonly known as:  BD DISP NEEDLES  Inject 1 mL B-12 subcutaneously  once a month     nitroGLYCERIN 0.4 MG SL tablet  Commonly known as:  NITROSTAT  Place 1 tablet (0.4 mg total) under the tongue every 5 (five) minutes x 3 doses as needed for chest pain.     pravastatin 80 MG tablet  Commonly known as:  PRAVACHOL  Take 1 tablet (80 mg total) by mouth every evening.     predniSONE 10 MG tablet  Commonly known as:  DELTASONE  40 mg po x 2 days, then 30mg  po x 2 days, then 20mg  po x 2 days, then 10mg  po x 2 days,     Syringe (Disposable) 3 ML Misc  Inject 1 mL B-12 subcutaneously once a month     valsartan 80 MG tablet  Commonly known as:  DIOVAN  Take 1 tablet (80 mg total) by mouth daily.       Allergies  Allergen Reactions  . Doxycycline Anaphylaxis  . Codeine Other (See Comments)    hyperactivity  . Lisinopril Cough  . Spiriva Handihaler [Tiotropium Bromide Monohydrate]     Rash and Urinary Retention  . Adhesive [Tape] Rash  . Dulera [Mometasone Furo-Formoterol Fum] Rash      The results of significant diagnostics from this hospitalization (including imaging, microbiology, ancillary and laboratory) are listed below for reference.    Significant Diagnostic Studies: Dg Chest 2 View  02/08/2014   CLINICAL DATA:   Shortness of breath for a few days.  COPD.  EXAM: CHEST  2 VIEW  COMPARISON:  Portable film earlier today.  FINDINGS: Hyperinflation without focal infiltrate. Cardiomediastinal silhouette is unremarkable and normal. Skeletal osteopenia without acute osseous findings. Vascular calcification.  IMPRESSION: Hyperinflation.  No active disease.   Electronically Signed   By: Rolla Flatten M.D.   On: 02/08/2014 13:06   Dg Chest 2 View  02/08/2014   CLINICAL DATA:  Shortness of breath; COPD and history of heavy tobacco use in the past now discontinued  EXAM: CHEST  2 VIEW  COMPARISON:  PA and lateral chest x-ray of February 08, 2014  FINDINGS: The lungs are hyperinflated. There is no focal infiltrate. The heart and pulmonary vascularity are normal. The mediastinum is normal in width. There is no pleural effusion. The bony thorax is unremarkable.  IMPRESSION: COPD. There is no evidence of pneumonia nor other acute cardiopulmonary abnormality.   Electronically Signed   By: David  Martinique   On: 02/08/2014 11:08    Microbiology: No results found for this or any previous visit (from the past 240 hour(s)).   Labs: Basic Metabolic Panel:  Recent Labs Lab 02/08/14 1154 02/08/14 1500 02/09/14 0237 02/11/14 1310  NA 136  --  138 135  K 3.8  --  4.3 3.9  CL 97  --  99 93*  CO2 32  --  30 33*  GLUCOSE 207*  --  208* 199*  BUN 7  --  12 15  CREATININE 0.41* 0.52 0.34* 0.42*  CALCIUM 9.1  --  8.9 9.0   Liver Function Tests: No results for input(s): AST, ALT, ALKPHOS, BILITOT, PROT, ALBUMIN in the last 168 hours. No results for input(s): LIPASE, AMYLASE in the last 168 hours. No results for input(s): AMMONIA in the last 168 hours. CBC:  Recent Labs Lab 02/08/14 1154 02/08/14 1500 02/09/14 0237 02/10/14 1122  WBC 5.7 6.6 6.0 10.2  HGB 11.8* 11.8* 10.7* 10.8*  HCT 36.8 37.3 34.0* 34.5*  MCV 92.0 91.9 91.4 94.0  PLT 200 227 218 266  Cardiac Enzymes: No results for input(s): CKTOTAL, CKMB, CKMBINDEX,  TROPONINI in the last 168 hours. BNP: BNP (last 3 results)  Recent Labs  02/20/13 1618  PROBNP 25.3   CBG:  Recent Labs Lab 02/10/14 1646 02/10/14 2310 02/11/14 0723 02/11/14 1150 02/11/14 1620  GLUCAP 112* 239* 196* 259* 86       Signed:  KRISHNAN,SENDIL K  [facility] 02/11/2014, 8:20 PM

## 2014-02-11 NOTE — Progress Notes (Signed)
Patient discharge teaching given, including activity, diet, follow-up appoints, and medications. Patient verbalized understanding of all discharge instructions. IV access was d/c'd. Vitals are stable. Skin is intact except as charted in most recent assessments. Pt to be escorted out by NT, to be driven home by family. 

## 2014-02-11 NOTE — Progress Notes (Signed)
Physical Therapy Treatment Patient Details Name: Judith Boyer MRN: [id number] DOB: 07/04/1945 Today's Date: 02/11/2014    History of Present Illness 69 y.o. female with Gold Stage 3 COPD, DM, HTN presents with 3 days of dyspnea and cough.     PT Comments    Progressing steadily.  Gait steady in a home-like environment with pt quickly fatiguing.  She could benefit from HHPT, but says she will likely decline.  Son may need to check in more frequently than usual.  Follow Up Recommendations  Home health PT;Supervision - Intermittent     Equipment Recommendations  None recommended by PT    Recommendations for Other Services       Precautions / Restrictions Precautions Precautions: Fall Restrictions Weight Bearing Restrictions: No    Mobility  Bed Mobility               General bed mobility comments: pt sitting EOB  Transfers Overall transfer level: Modified independent Equipment used: None Transfers: Sit to/from Stand Sit to Stand: Modified independent (Device/Increase time)         General transfer comment: safe from various surfaces  Ambulation/Gait Ambulation/Gait assistance: Supervision Ambulation Distance (Feet): 110 Feet Assistive device: None Gait Pattern/deviations: Step-through pattern Gait velocity: decreased   General Gait Details: steady, but stiff and mildly guarded.  Pushing IV pole and periods not using any assistive device.  On 2 L [place] pt maintained 95-97% sats, but EHR was variable in the 110's.  At those times, pt  felt it warranted to not talk.   Stairs Stairs: Yes Stairs assistance: Min assist Stair Management: One rail Right;Step to pattern;Forwards Number of Stairs: 3 General stair comments: safe with rail and will have son to assist  Wheelchair Mobility    Modified Rankin (Stroke Patients Only)       Balance Overall balance assessment: Needs assistance Sitting-balance support: No upper extremity supported Sitting  balance-Leahy Scale: Good     Standing balance support: No upper extremity supported Standing balance-Leahy Scale: Fair                      Cognition Arousal/Alertness: Awake/alert Behavior During Therapy: WFL for tasks assessed/performed;Flat affect (curt) Overall Cognitive Status: Within Functional Limits for tasks assessed                      Exercises      General Comments General comments (skin integrity, edema, etc.): With gait, sats 95-97% on 2L with need for pursed lip breathing and not wanting to talk at EHR 119 bpm.      Pertinent Vitals/Pain Pain Assessment: No/denies pain    Home Living                      Prior Function            PT Goals (current goals can now be found in the care plan section) Acute Rehab PT Goals Patient Stated Goal: to be breathing better PT Goal Formulation: With patient Time For Goal Achievement: 02/17/14 Potential to Achieve Goals: Good Progress towards PT goals: Progressing toward goals    Frequency  Min 3X/week    PT Plan Current plan remains appropriate    Co-evaluation             End of Session Equipment Utilized During Treatment: Oxygen Activity Tolerance: Patient limited by fatigue Patient left: in bed;with call bell/phone within reach;with nursing/sitter in room  Time: 4818-5631 PT Time Calculation (min) (ACUTE ONLY): 23 min  Charges:  $Gait Training: 8-22 mins $Therapeutic Activity: 8-22 mins                    G Codes:      Judith Boyer, Judith Boyer 02/11/2014, 9:41 AM  02/11/2014  Judith Boyer, PT [phone]  (pager)

## 2014-02-12 DIAGNOSIS — J449 Chronic obstructive pulmonary disease, unspecified: Secondary | ICD-10-CM | POA: Diagnosis not present

## 2014-02-15 DIAGNOSIS — J449 Chronic obstructive pulmonary disease, unspecified: Secondary | ICD-10-CM | POA: Diagnosis not present

## 2014-02-18 ENCOUNTER — Telehealth: Payer: Self-pay | Admitting: Emergency Medicine

## 2014-02-18 NOTE — Telephone Encounter (Signed)
Beverlee Nims, a case Freight forwarder with [employer], states that patient was recently hospitalized. Patient has a list of meds that she should be taking however patient cannot afford many of them therefore she does not take them. Beverlee Nims reached out to [place] to see if there is some way that we can help patient. Are there any resources available?   [phone] extension [phone]

## 2014-02-18 NOTE — Telephone Encounter (Signed)
Please advise 

## 2014-02-19 NOTE — Telephone Encounter (Signed)
Spoke to patient for an extended time- She is not feeling any better than when she came to this office for treatment and sent to the hospital. She states she was put on a cardiac floor for her rapid heart beat and it was determined she has been taking too much albuterol. They did not consult a pulmonary doctor even though her extensive history with COPD. She did not feel that she was ready to be discharged but was sent home anyhow.   She states her pulmonologist turned care back over to Dr. Everlene Farrier a while ago and she has be discharged from him. Last time she checked they did not have any samples of the medications either. She is concerned about her health and does not have any transportation here until Monday. (Advised her Dr. Everlene Farrier would be here until 2pm)  Pt is very breathless on the phone- taking many pauses and sounds exasperated.   The medication she is going to have the most difficulty with is the Advair. This medication may run her around $170 per month. She is not going to be able to afford this. Went to the website for the medication and started answering the questions for reduction in cost- pt does not qualify for discounts based on Medicare. Medicare patient are not eligible to receive free trial nor reduced monthly cost. Pt is very tearful.   She confirms she will be in on Monday. She will need a refill on her Valsartin for next month when she comes in also.

## 2014-02-19 NOTE — Telephone Encounter (Signed)
Printed information to provide to pt during OV- placed in Dr. Perfecto Kingdom box.  [place] [facility] and [employer] programs- Pt would most likely benefit from the [facility] program. [employer]-access requires pt out of pocket expense to exceed $600 before they will pick up any cost.

## 2014-02-19 NOTE — Telephone Encounter (Signed)
It actually looks like this message actually was sent by pt on 02/07/14, but showed up in the in-basket 02/17/14, not sure why? Pt has received prednisone in hosp which should just be finished today.

## 2014-02-19 NOTE — Telephone Encounter (Signed)
LM for Beverlee Nims at [facility].

## 2014-02-19 NOTE — Telephone Encounter (Signed)
Spoke to pt who reported that she does have enough Advair to last her until after she sees Dr Everlene Farrier on Monday. Pt was not SOB while talking to me but advised that if she worsens again she should call 911. Pt agreed. Clarise Cruz is working on trying to find pt assistance through [facility] or other programs.

## 2014-02-19 NOTE — Telephone Encounter (Signed)
We can see if we can get her in with [facility] and see if they can offer some suggestions. We currently do not have any samples in our office of respiratory medications. She does have a pulmonary doctor and they may have samples in their office.

## 2014-02-19 NOTE — Telephone Encounter (Signed)
Dr Everlene Farrier, pt had sent this request for RF of prednisone through MyChart yesterday, but she did not say anything about it to either Clarise Cruz or I when we talked to her this morning, except Clarise Cruz reported that she mentioned that she felt better after she took it but is feeling worse again now. According to Rx given in hosp, she should just be finishing that round today.

## 2014-02-22 ENCOUNTER — Ambulatory Visit (INDEPENDENT_AMBULATORY_CARE_PROVIDER_SITE_OTHER): Payer: Medicare Other | Admitting: Emergency Medicine

## 2014-02-22 VITALS — BP 142/60 | HR 115 | Temp 97.1°F | Resp 16

## 2014-02-22 DIAGNOSIS — I10 Essential (primary) hypertension: Secondary | ICD-10-CM | POA: Diagnosis not present

## 2014-02-22 DIAGNOSIS — J449 Chronic obstructive pulmonary disease, unspecified: Secondary | ICD-10-CM

## 2014-02-22 DIAGNOSIS — E538 Deficiency of other specified B group vitamins: Secondary | ICD-10-CM

## 2014-02-22 DIAGNOSIS — R54 Age-related physical debility: Secondary | ICD-10-CM

## 2014-02-22 DIAGNOSIS — F411 Generalized anxiety disorder: Secondary | ICD-10-CM

## 2014-02-22 MED ORDER — CYANOCOBALAMIN 1000 MCG/ML IJ SOLN
1000.0000 ug | INTRAMUSCULAR | Status: AC
  Administered 2014-02-22: 1000 ug via INTRAMUSCULAR

## 2014-02-22 MED ORDER — ALPRAZOLAM 0.25 MG PO TABS
0.2500 mg | ORAL_TABLET | Freq: Four times a day (QID) | ORAL | Status: DC | PRN
Start: 2014-02-22 — End: 2014-08-13

## 2014-02-22 MED ORDER — VALSARTAN 80 MG PO TABS: 80.0000 mg | ORAL_TABLET | Freq: Every day | ORAL | Status: DC

## 2014-02-22 NOTE — Progress Notes (Signed)
MRN: [id number] DOB: 10-Oct-1945  Subjective:   Judith Boyer is a 69 y.o. female presenting for hospitalization follow up.  Hospitalization occurred 2 weeks ago, where patient stayed for 4 days and treated for bronchitis exacerbated COPD.  Patient states that she feels much better since initially visiting Dr. Everlene Farrier, with 93% O2 and RR of 48, and transported to ED.  Patient was placed on azithromycin for 3 days.  Patient states that her breathing is much better and stable.  Patient reports that at the hospital she had a panic attack.  She has had multiple panic attacks originating more than decades ago, and resolving for over 20 years, but within the last year, she has felt the attacks resurface.  In the hospital, patient was treated with ativan, which she states helped tremendously, though made her sleepy.  Patient was given lorazepam one year ago, but she states that she did not take this medication at the time.    Patient is also concerned of purchasing her medication--particularly the pro-air.  She currently has all of her medications at this time.    Judith Boyer has a current medication list which includes the following prescription(s): albuterol, albuterol, clopidogrel, dextromethorphan-guaifenesin, feeding supplement (glucerna shake), fluticasone-salmeterol, ipratropium-albuterol, iron, lorazepam, metformin, montelukast, multivitamin with minerals, needle (disp) 30 g, nitroglycerin, pravastatin, prednisone, flutter, syringe (disposable), valsartan, and cyanocobalamin, and the following Facility-Administered Medications: influenza vac split quadrivalent pf.  She is allergic to doxycycline; codeine; lisinopril; spiriva handihaler; adhesive; and dulera.  Judith Boyer  has a past medical history of COPD (chronic obstructive pulmonary disease); Hypertension; Coronary artery disease; Shortness of breath; High cholesterol; Myocardial infarction (02/2011); On home oxygen therapy; Pneumonia; Type II  diabetes mellitus; Anemia; and History of blood transfusion (2014). Also  has past surgical history that includes Shoulder arthroscopy w/ rotator cuff repair (Left); left heart catheterization with coronary angiogram (N/A, 02/09/2011); Tonsillectomy; Abdominal hysterectomy (1970's); Coronary angioplasty with stent (12/2007); and Cardiac catheterization (02/2011).  ROS As in subjective.  Objective:   Vitals: BP 142/60 mmHg  Pulse 115  Temp(Src) 97.1 F (36.2 C) (Oral)  Resp 16  SpO2 97%  Physical Exam  Constitutional: She is oriented to person, place, and time and well-developed, well-nourished, and in no distress.  HENT:  Head: Normocephalic and atraumatic.  Eyes: Conjunctivae are normal. Pupils are equal, round, and reactive to light.  Cardiovascular: Normal rate, regular rhythm and normal heart sounds.  Exam reveals no gallop and no friction rub.   No murmur heard. Normal capillary refill.  Pulmonary/Chest: Accessory muscle usage present. No apnea. No respiratory distress. She has no wheezes. She has no rales.  She appears to use her accessory muscles when prompted to breathe, however is not used while communicating to provider.  She has decreased breath sounds, though are present in all lobes.    Neurological: She is alert and oriented to person, place, and time.  Skin: Skin is warm and dry.  Psychiatric: Memory, affect and judgment normal. Her mood appears anxious (Mild).    Assessment and Plan :   69 year old female with COPD, HTN, and anxiety is here today for follow up regarding a recent hospitalization.  Patient appears to be much improved from recent visit that had warranted ED visit and proceeding admission.    Essential hypertension  -Stable, refill the valsartan  COPD, severe -Stable at this time, on 2L oxygen with normal Sat O2 97%.  Azithromycin appears to have treated bronchial infection.   -Needs to refill albuterol  medications.  Patient will obtain forms from case  manager for medication financial assistance  Anxiety state Alprazolam (XANAX) 0.25 MG tablet -Appears to exacerbate the COPD.  Treating with half tabs q4-6hrs PRN  B12 deficiency cyanocobalamin ((VITAMIN B-12)) injection 1,000 mcg  Frail elderly -Recommended protein shakes.     Judith Drape, PA-C [facility] 1/18/20168:38 PM

## 2014-02-22 NOTE — Patient Instructions (Addendum)
Please obtain protein shakes.  We need to get your weight up.    Please have the case manager contact Dr. Everlene Farrier, or fax, with the paperwork that is needed to get the medication assistance.

## 2014-02-23 ENCOUNTER — Telehealth: Payer: Self-pay

## 2014-02-23 NOTE — Telephone Encounter (Signed)
Pt called and wants Dr. Everlene Farrier to call her at home. It is regarding assistance w/ her medication and she thinks she will be able to get it but she needs to speak with him first. CB # [phone]

## 2014-02-24 ENCOUNTER — Telehealth: Payer: Self-pay

## 2014-02-24 NOTE — Telephone Encounter (Signed)
Pt  states she needs Dr. Perfecto Kingdom approval to get state aid with assistance with her medication. She said the deadline is today. Please advise at [phone]

## 2014-02-25 NOTE — Telephone Encounter (Signed)
Duplicate message. 

## 2014-02-25 NOTE — Telephone Encounter (Signed)
Please call patient and find out exactly what she needs. I'm not clear what I need to sign. I will be happy to help in any way I can but I need to know exactly what she needs done.

## 2014-02-28 NOTE — Telephone Encounter (Signed)
Pt states the the state is sending paperwork- we have not received this. Asthma not COPD so she will continue to get her medications for free.

## 2014-03-10 ENCOUNTER — Telehealth: Payer: Self-pay | Admitting: *Deleted

## 2014-03-10 NOTE — Telephone Encounter (Signed)
Phoned & spoke with Beth @ [facility] and patient has not had DEE since 04/02/2012 and does not have anything scheduled at this time.

## 2014-03-15 DIAGNOSIS — J449 Chronic obstructive pulmonary disease, unspecified: Secondary | ICD-10-CM | POA: Diagnosis not present

## 2014-03-18 DIAGNOSIS — J449 Chronic obstructive pulmonary disease, unspecified: Secondary | ICD-10-CM | POA: Diagnosis not present

## 2014-04-06 ENCOUNTER — Encounter: Payer: Self-pay | Admitting: *Deleted

## 2014-04-13 DIAGNOSIS — J449 Chronic obstructive pulmonary disease, unspecified: Secondary | ICD-10-CM | POA: Diagnosis not present

## 2014-04-16 DIAGNOSIS — J449 Chronic obstructive pulmonary disease, unspecified: Secondary | ICD-10-CM | POA: Diagnosis not present

## 2014-05-01 ENCOUNTER — Ambulatory Visit (INDEPENDENT_AMBULATORY_CARE_PROVIDER_SITE_OTHER): Payer: Medicare Other | Admitting: Emergency Medicine

## 2014-05-01 VITALS — BP 120/62 | HR 122 | Temp 97.8°F | Resp 16 | Wt 98.0 lb

## 2014-05-01 DIAGNOSIS — J40 Bronchitis, not specified as acute or chronic: Secondary | ICD-10-CM | POA: Diagnosis not present

## 2014-05-01 DIAGNOSIS — R350 Frequency of micturition: Secondary | ICD-10-CM

## 2014-05-01 DIAGNOSIS — J449 Chronic obstructive pulmonary disease, unspecified: Secondary | ICD-10-CM | POA: Diagnosis not present

## 2014-05-01 DIAGNOSIS — E1310 Other specified diabetes mellitus with ketoacidosis without coma: Secondary | ICD-10-CM

## 2014-05-01 DIAGNOSIS — E1110 Type 2 diabetes mellitus with ketoacidosis without coma: Secondary | ICD-10-CM

## 2014-05-01 LAB — BASIC METABOLIC PANEL
BUN: 10 mg/dL (ref 6–23)
CO2: 35 mEq/L — ABNORMAL HIGH (ref 19–32)
CREATININE: 0.54 mg/dL (ref 0.50–1.10)
Calcium: 9.9 mg/dL (ref 8.4–10.5)
Chloride: 95 mEq/L — ABNORMAL LOW (ref 96–112)
Glucose, Bld: 194 mg/dL — ABNORMAL HIGH (ref 70–99)
POTASSIUM: 4.1 meq/L (ref 3.5–5.3)
Sodium: 141 mEq/L (ref 135–145)

## 2014-05-01 LAB — POCT URINALYSIS DIPSTICK
Bilirubin, UA: NEGATIVE
Glucose, UA: NEGATIVE
KETONES UA: NEGATIVE
Leukocytes, UA: NEGATIVE
Nitrite, UA: NEGATIVE
Protein, UA: NEGATIVE
Spec Grav, UA: 1.015
UROBILINOGEN UA: 0.2
pH, UA: 7.5

## 2014-05-01 LAB — POCT CBC
Granulocyte percent: 63.7 %G (ref 37–80)
HCT, POC: 41.6 % (ref 37.7–47.9)
HEMOGLOBIN: 12.7 g/dL (ref 12.2–16.2)
LYMPH, POC: 3.3 (ref 0.6–3.4)
MCH, POC: 28.6 pg (ref 27–31.2)
MCHC: 30.6 g/dL — AB (ref 31.8–35.4)
MCV: 93.4 fL (ref 80–97)
MID (cbc): 0.5 (ref 0–0.9)
MPV: 8.1 fL (ref 0–99.8)
PLATELET COUNT, POC: 313 10*3/uL (ref 142–424)
POC Granulocyte: 6.6 (ref 2–6.9)
POC LYMPH PERCENT: 31.9 %L (ref 10–50)
POC MID %: 4.4 % (ref 0–12)
RBC: 4.45 M/uL (ref 4.04–5.48)
RDW, POC: 14.1 %
WBC: 10.4 10*3/uL — AB (ref 4.6–10.2)

## 2014-05-01 LAB — GLUCOSE, POCT (MANUAL RESULT ENTRY): POC Glucose: 206 mg/dl — AB (ref 70–99)

## 2014-05-01 LAB — POCT UA - MICROSCOPIC ONLY
Bacteria, U Microscopic: NEGATIVE
CRYSTALS, UR, HPF, POC: NEGATIVE
Casts, Ur, LPF, POC: NEGATIVE
Epithelial cells, urine per micros: NEGATIVE
Mucus, UA: NEGATIVE
RBC, urine, microscopic: NEGATIVE
WBC, Ur, HPF, POC: NEGATIVE
YEAST UA: NEGATIVE

## 2014-05-01 LAB — POCT GLYCOSYLATED HEMOGLOBIN (HGB A1C): HEMOGLOBIN A1C: 5.6

## 2014-05-01 MED ORDER — AMOXICILLIN 500 MG PO CAPS: 500.0000 mg | ORAL_CAPSULE | Freq: Two times a day (BID) | ORAL | Status: DC

## 2014-05-01 MED ORDER — METFORMIN HCL 1000 MG PO TABS: 1000.0000 mg | ORAL_TABLET | Freq: Every day | ORAL | Status: DC

## 2014-05-01 MED ORDER — PREDNISONE 10 MG PO TABS: ORAL_TABLET | ORAL | Status: DC

## 2014-05-01 NOTE — Patient Instructions (Signed)
Take your prednisone in a taper dose. Decrease your Glucophage metformin to once a day. Take amoxicillin twice a day for 5 days

## 2014-05-01 NOTE — Progress Notes (Addendum)
Subjective:    Patient ID: Judith Boyer, female    DOB: Oct 15, 1945, 69 y.o.   MRN: [id number]  This chart was scribed for [name], MD by Stephania Fragmin, ED Scribe. This patient was seen in room 4 and the patient's care was started at 12:27 PM.   HPI  HPI Comments: Judith Boyer is a 69 y.o. female who presents to the [facility] complaining of shortness of breath. She thinks it may be due to exposure to pollen. Patient would like a prednisone taper so that she can attend a wedding. Patient denies fever or chest pain. She states she has been doing 4 breathing treatments a day. She also uses Duoneb and an additional albuterol daily.    Patient notes that, the other day, she had fallen asleep and woke up, not knowing where she was. She was also stumbling around the house, walking into objects. Her hands also began to "draw." These symptoms lasted for 2 days before they completely resolved on their own.  Patient reports she hasn't been checking her blood glucose. She notes she doesn't have the energy to perform certain activities. She also adds that she stays tachycardic at 120, which wears her down further.  Patient also complains of polyuria recently. She would like to be screened for a UTI.  Patient states she has advance medical directive papers at home, but she hasn't filled them out yet.    Review of Systems  Constitutional: Negative for fever.  Respiratory: Positive for shortness of breath.   Cardiovascular: Negative for chest pain.       Objective:   Physical Exam  Nursing note and vitals reviewed.   CONSTITUTIONAL: Well developed/well nourished HEAD: Normocephalic/atraumatic EYES: EOMI/PERRL ENMT: Mucous membranes moist NECK: supple no meningeal signs SPINE/BACK:entire spine nontender CV: S1/S2 noted, no murmurs/rubs/gallops noted LUNGS: Lungs are clear to auscultation bilaterally, no apparent distress ABDOMEN: soft, nontender, no  rebound or guarding, bowel sounds noted throughout abdomen GU:no cva tenderness NEURO: Pt is awake/alert/appropriate, moves all extremitiesx4.  No facial droop.   EXTREMITIES: pulses normal/equal, full ROM SKIN: warm, color normal PSYCH: no abnormalities of mood noted, alert and oriented to situation  Results for orders placed or performed in visit on 05/01/14  POCT CBC  Result Value Ref Range   WBC 10.4 (A) 4.6 - 10.2 K/uL   Lymph, poc 3.3 0.6 - 3.4   POC LYMPH PERCENT 31.9 10 - 50 %L   MID (cbc) 0.5 0 - 0.9   POC MID % 4.4 0 - 12 %M   POC Granulocyte 6.6 2 - 6.9   Granulocyte percent 63.7 37 - 80 %G   RBC 4.45 4.04 - 5.48 M/uL   Hemoglobin 12.7 12.2 - 16.2 g/dL   HCT, POC 41.6 37.7 - 47.9 %   MCV 93.4 80 - 97 fL   MCH, POC 28.6 27 - 31.2 pg   MCHC 30.6 (A) 31.8 - 35.4 g/dL   RDW, POC 14.1 %   Platelet Count, POC 313 142 - 424 K/uL   MPV 8.1 0 - 99.8 fL  POCT urinalysis dipstick  Result Value Ref Range   Color, UA yellow    Clarity, UA clear    Glucose, UA neg    Bilirubin, UA neg    Ketones, UA neg    Spec Grav, UA 1.015    Blood, UA tr-intact    pH, UA 7.5    Protein, UA neg  Urobilinogen, UA 0.2    Nitrite, UA neg    Leukocytes, UA Negative   POCT UA - Microscopic Only  Result Value Ref Range   WBC, Ur, HPF, POC neg    RBC, urine, microscopic neg    Bacteria, U Microscopic neg    Mucus, UA neg    Epithelial cells, urine per micros neg    Crystals, Ur, HPF, POC neg    Casts, Ur, LPF, POC neg    Yeast, UA neg   POCT glucose (manual entry)  Result Value Ref Range   POC Glucose 206 (A) 70 - 99 mg/dl  POCT glycosylated hemoglobin (Hb A1C)  Result Value Ref Range   Hemoglobin A1C 5.6       Assessment & Plan:  Hemoglobin A1c is 5.6 her metformin was decreased to 500 mg twice a day. She is put on a taper dose of prednisone and amoxicillin for 7 days. Recheck 4-6 weeks.I personally performed the services described in this documentation, which was scribed in my  presence. The recorded information has been reviewed and is accurate.

## 2014-05-03 ENCOUNTER — Telehealth: Payer: Self-pay | Admitting: *Deleted

## 2014-05-03 NOTE — Telephone Encounter (Signed)
[facility] called in regards to patients urine culture. States that they received the urine culture tube without any indentifiers on it and are therefore unable to preform that culture. Would you like for pt to come back in to recollect so this can be done ?

## 2014-05-03 NOTE — Telephone Encounter (Signed)
No need to have patient come back in. UA in the office was normal

## 2014-05-14 DIAGNOSIS — J449 Chronic obstructive pulmonary disease, unspecified: Secondary | ICD-10-CM | POA: Diagnosis not present

## 2014-05-17 DIAGNOSIS — J449 Chronic obstructive pulmonary disease, unspecified: Secondary | ICD-10-CM | POA: Diagnosis not present

## 2014-06-13 DIAGNOSIS — J449 Chronic obstructive pulmonary disease, unspecified: Secondary | ICD-10-CM | POA: Diagnosis not present

## 2014-06-16 DIAGNOSIS — J449 Chronic obstructive pulmonary disease, unspecified: Secondary | ICD-10-CM | POA: Diagnosis not present

## 2014-06-18 ENCOUNTER — Encounter (HOSPITAL_COMMUNITY): Payer: Self-pay | Admitting: *Deleted

## 2014-06-18 ENCOUNTER — Ambulatory Visit (INDEPENDENT_AMBULATORY_CARE_PROVIDER_SITE_OTHER): Payer: Medicare Other | Admitting: Emergency Medicine

## 2014-06-18 ENCOUNTER — Emergency Department (HOSPITAL_COMMUNITY): Payer: Medicare Other

## 2014-06-18 ENCOUNTER — Observation Stay (HOSPITAL_COMMUNITY)
Admission: EM | Admit: 2014-06-18 | Discharge: 2014-06-21 | Disposition: A | Discharge status: Home / Self Care | Payer: Medicare Other | Attending: Family Medicine | Admitting: Family Medicine

## 2014-06-18 VITALS — BP 177/84 | HR 118 | Temp 98.1°F | Resp 24

## 2014-06-18 DIAGNOSIS — R0602 Shortness of breath: Secondary | ICD-10-CM | POA: Diagnosis not present

## 2014-06-18 DIAGNOSIS — R069 Unspecified abnormalities of breathing: Secondary | ICD-10-CM | POA: Diagnosis not present

## 2014-06-18 DIAGNOSIS — Z90710 Acquired absence of both cervix and uterus: Secondary | ICD-10-CM | POA: Diagnosis not present

## 2014-06-18 DIAGNOSIS — Z885 Allergy status to narcotic agent status: Secondary | ICD-10-CM | POA: Diagnosis not present

## 2014-06-18 DIAGNOSIS — I10 Essential (primary) hypertension: Secondary | ICD-10-CM | POA: Diagnosis not present

## 2014-06-18 DIAGNOSIS — Z955 Presence of coronary angioplasty implant and graft: Secondary | ICD-10-CM | POA: Diagnosis not present

## 2014-06-18 DIAGNOSIS — I2510 Atherosclerotic heart disease of native coronary artery without angina pectoris: Secondary | ICD-10-CM | POA: Insufficient documentation

## 2014-06-18 DIAGNOSIS — Z801 Family history of malignant neoplasm of trachea, bronchus and lung: Secondary | ICD-10-CM | POA: Diagnosis not present

## 2014-06-18 DIAGNOSIS — E119 Type 2 diabetes mellitus without complications: Secondary | ICD-10-CM

## 2014-06-18 DIAGNOSIS — J441 Chronic obstructive pulmonary disease with (acute) exacerbation: Secondary | ICD-10-CM

## 2014-06-18 DIAGNOSIS — Z87891 Personal history of nicotine dependence: Secondary | ICD-10-CM | POA: Diagnosis not present

## 2014-06-18 DIAGNOSIS — I252 Old myocardial infarction: Secondary | ICD-10-CM | POA: Insufficient documentation

## 2014-06-18 DIAGNOSIS — Z91040 Latex allergy status: Secondary | ICD-10-CM | POA: Insufficient documentation

## 2014-06-18 DIAGNOSIS — Z7902 Long term (current) use of antithrombotics/antiplatelets: Secondary | ICD-10-CM | POA: Insufficient documentation

## 2014-06-18 DIAGNOSIS — E785 Hyperlipidemia, unspecified: Secondary | ICD-10-CM | POA: Diagnosis not present

## 2014-06-18 DIAGNOSIS — E872 Acidosis: Secondary | ICD-10-CM | POA: Diagnosis not present

## 2014-06-18 DIAGNOSIS — E780 Pure hypercholesterolemia: Secondary | ICD-10-CM | POA: Insufficient documentation

## 2014-06-18 DIAGNOSIS — R000 Tachycardia, unspecified: Secondary | ICD-10-CM | POA: Diagnosis not present

## 2014-06-18 DIAGNOSIS — E8729 Other acidosis: Secondary | ICD-10-CM | POA: Insufficient documentation

## 2014-06-18 DIAGNOSIS — F419 Anxiety disorder, unspecified: Secondary | ICD-10-CM | POA: Insufficient documentation

## 2014-06-18 LAB — CBC WITH DIFFERENTIAL/PLATELET
BASOS PCT: 0 % (ref 0–1)
Basophils Absolute: 0 10*3/uL (ref 0.0–0.1)
Eosinophils Absolute: 0.1 10*3/uL (ref 0.0–0.7)
Eosinophils Relative: 2 % (ref 0–5)
HEMATOCRIT: 38.8 % (ref 36.0–46.0)
Hemoglobin: 12.5 g/dL (ref 12.0–15.0)
LYMPHS ABS: 2.3 10*3/uL (ref 0.7–4.0)
LYMPHS PCT: 24 % (ref 12–46)
MCH: 29.4 pg (ref 26.0–34.0)
MCHC: 32.2 g/dL (ref 30.0–36.0)
MCV: 91.3 fL (ref 78.0–100.0)
MONO ABS: 0.5 10*3/uL (ref 0.1–1.0)
Monocytes Relative: 6 % (ref 3–12)
NEUTROS ABS: 6.4 10*3/uL (ref 1.7–7.7)
NEUTROS PCT: 68 % (ref 43–77)
Platelets: 306 10*3/uL (ref 150–400)
RBC: 4.25 MIL/uL (ref 3.87–5.11)
RDW: 12 % (ref 11.5–15.5)
WBC: 9.4 10*3/uL (ref 4.0–10.5)

## 2014-06-18 LAB — COMPREHENSIVE METABOLIC PANEL
ALT: 18 U/L (ref 14–54)
AST: 20 U/L (ref 15–41)
Albumin: 3.8 g/dL (ref 3.5–5.0)
Alkaline Phosphatase: 85 U/L (ref 38–126)
Anion gap: 12 (ref 5–15)
BUN: 11 mg/dL (ref 6–20)
CO2: 30 mmol/L (ref 22–32)
Calcium: 9.2 mg/dL (ref 8.9–10.3)
Chloride: 97 mmol/L — ABNORMAL LOW (ref 101–111)
Creatinine, Ser: 0.48 mg/dL (ref 0.44–1.00)
GFR calc Af Amer: >60 mL/min (ref >60)
GFR calc non Af Amer: >60 mL/min (ref >60)
GLUCOSE: 151 mg/dL — AB (ref 65–99)
Potassium: 3.9 mmol/L (ref 3.5–5.1)
Sodium: 139 mmol/L (ref 135–145)
TOTAL PROTEIN: 6.9 g/dL (ref 6.5–8.1)
Total Bilirubin: 0.1 mg/dL — ABNORMAL LOW (ref 0.3–1.2)

## 2014-06-18 LAB — I-STAT ARTERIAL BLOOD GAS, ED
Acid-Base Excess: 4 mmol/L — ABNORMAL HIGH (ref 0.0–2.0)
Bicarbonate: 32.1 mEq/L — ABNORMAL HIGH (ref 20.0–24.0)
O2 Saturation: 100 %
PCO2 ART: 63.7 mmHg — AB (ref 35.0–45.0)
PH ART: 7.31 — AB (ref 7.350–7.450)
PO2 ART: 257 mmHg — AB (ref 80.0–100.0)
TCO2: 34 mmol/L (ref 0–100)

## 2014-06-18 LAB — BRAIN NATRIURETIC PEPTIDE: B NATRIURETIC PEPTIDE 5: 12.2 pg/mL (ref 0.0–100.0)

## 2014-06-18 LAB — GLUCOSE, POCT (MANUAL RESULT ENTRY): POC Glucose: 174 mg/dl — AB (ref 70–99)

## 2014-06-18 LAB — TROPONIN I

## 2014-06-18 LAB — GLUCOSE, CAPILLARY: Glucose-Capillary: 287 mg/dL — ABNORMAL HIGH (ref 65–99)

## 2014-06-18 LAB — MRSA PCR SCREENING: MRSA BY PCR: NEGATIVE

## 2014-06-18 MED ORDER — FLUTICASONE-SALMETEROL 500-50 MCG/DOSE IN AEPB: 1.0000 | INHALATION_SPRAY | Freq: Two times a day (BID) | RESPIRATORY_TRACT | Status: DC

## 2014-06-18 MED ORDER — ALBUTEROL SULFATE (2.5 MG/3ML) 0.083% IN NEBU: 2.5000 mg | INHALATION_SOLUTION | RESPIRATORY_TRACT | Status: DC | PRN

## 2014-06-18 MED ORDER — ACETAMINOPHEN 325 MG PO TABS: 650.0000 mg | ORAL_TABLET | Freq: Four times a day (QID) | ORAL | Status: DC | PRN

## 2014-06-18 MED ORDER — INSULIN ASPART 100 UNIT/ML ~~LOC~~ SOLN
5.0000 [IU] | Freq: Once | SUBCUTANEOUS | Status: AC
  Administered 2014-06-18: 5 [IU] via SUBCUTANEOUS

## 2014-06-18 MED ORDER — SODIUM CHLORIDE 0.9 % IJ SOLN: 3.0000 mL | Freq: Two times a day (BID) | INTRAMUSCULAR | Status: DC

## 2014-06-18 MED ORDER — ALBUTEROL SULFATE (2.5 MG/3ML) 0.083% IN NEBU
2.5000 mg | INHALATION_SOLUTION | Freq: Once | RESPIRATORY_TRACT | Status: AC
  Administered 2014-06-18: 2.5 mg via RESPIRATORY_TRACT

## 2014-06-18 MED ORDER — INSULIN ASPART 100 UNIT/ML ~~LOC~~ SOLN
0.0000 [IU] | Freq: Three times a day (TID) | SUBCUTANEOUS | Status: DC
  Administered 2014-06-19: 5 [IU] via SUBCUTANEOUS
  Administered 2014-06-19: 3 [IU] via SUBCUTANEOUS
  Administered 2014-06-19: 5 [IU] via SUBCUTANEOUS
  Administered 2014-06-20: 2 [IU] via SUBCUTANEOUS
  Administered 2014-06-20: 7 [IU] via SUBCUTANEOUS
  Administered 2014-06-20: 1 [IU] via SUBCUTANEOUS

## 2014-06-18 MED ORDER — IPRATROPIUM-ALBUTEROL 0.5-2.5 (3) MG/3ML IN SOLN
3.0000 mL | Freq: Four times a day (QID) | RESPIRATORY_TRACT | Status: DC
  Administered 2014-06-18 – 2014-06-19 (×2): 3 mL via RESPIRATORY_TRACT
  Filled 2014-06-18 (×2): qty 3

## 2014-06-18 MED ORDER — BISACODYL 10 MG RE SUPP: 10.0000 mg | Freq: Every day | RECTAL | Status: DC | PRN

## 2014-06-18 MED ORDER — SODIUM CHLORIDE 0.9 % IV SOLN: 250.0000 mL | INTRAVENOUS | Status: DC | PRN

## 2014-06-18 MED ORDER — PRAVASTATIN SODIUM 80 MG PO TABS
80.0000 mg | ORAL_TABLET | Freq: Every evening | ORAL | Status: DC
  Administered 2014-06-18 – 2014-06-20 (×3): 80 mg via ORAL
  Filled 2014-06-18 (×4): qty 1

## 2014-06-18 MED ORDER — PREDNISONE 50 MG PO TABS
60.0000 mg | ORAL_TABLET | Freq: Every day | ORAL | Status: DC
  Administered 2014-06-19 – 2014-06-21 (×3): 60 mg via ORAL
  Filled 2014-06-18 (×4): qty 1

## 2014-06-18 MED ORDER — ALBUTEROL (5 MG/ML) CONTINUOUS INHALATION SOLN
10.0000 mg/h | INHALATION_SOLUTION | Freq: Once | RESPIRATORY_TRACT | Status: AC
  Administered 2014-06-18: 10 mg/h via RESPIRATORY_TRACT
  Filled 2014-06-18: qty 20

## 2014-06-18 MED ORDER — IPRATROPIUM-ALBUTEROL 0.5-2.5 (3) MG/3ML IN SOLN
3.0000 mL | RESPIRATORY_TRACT | Status: DC
  Administered 2014-06-18: 3 mL via RESPIRATORY_TRACT
  Filled 2014-06-18: qty 3

## 2014-06-18 MED ORDER — SENNA 8.6 MG PO TABS
1.0000 | ORAL_TABLET | Freq: Two times a day (BID) | ORAL | Status: DC
  Administered 2014-06-20 (×2): 8.6 mg via ORAL
  Filled 2014-06-18 (×9): qty 1

## 2014-06-18 MED ORDER — METHYLPREDNISOLONE SODIUM SUCC 125 MG IJ SOLR
125.0000 mg | Freq: Once | INTRAMUSCULAR | Status: AC
  Administered 2014-06-18: 125 mg via INTRAVENOUS
  Filled 2014-06-18: qty 2

## 2014-06-18 MED ORDER — POLYETHYLENE GLYCOL 3350 17 G PO PACK
17.0000 g | PACK | Freq: Every day | ORAL | Status: DC | PRN
  Filled 2014-06-18: qty 1

## 2014-06-18 MED ORDER — FLUTICASONE-SALMETEROL 250-50 MCG/DOSE IN AEPB
1.0000 | INHALATION_SPRAY | Freq: Two times a day (BID) | RESPIRATORY_TRACT | Status: DC
  Filled 2014-06-18: qty 14

## 2014-06-18 MED ORDER — ONDANSETRON HCL 4 MG/2ML IJ SOLN: 4.0000 mg | Freq: Four times a day (QID) | INTRAMUSCULAR | Status: DC | PRN

## 2014-06-18 MED ORDER — SODIUM CHLORIDE 0.9 % IJ SOLN: 3.0000 mL | INTRAMUSCULAR | Status: DC | PRN

## 2014-06-18 MED ORDER — ACETAMINOPHEN 650 MG RE SUPP: 650.0000 mg | Freq: Four times a day (QID) | RECTAL | Status: DC | PRN

## 2014-06-18 MED ORDER — SODIUM CHLORIDE 0.9 % IJ SOLN
3.0000 mL | Freq: Two times a day (BID) | INTRAMUSCULAR | Status: DC
  Administered 2014-06-18 – 2014-06-21 (×4): 3 mL via INTRAVENOUS

## 2014-06-18 MED ORDER — HEPARIN SODIUM (PORCINE) 5000 UNIT/ML IJ SOLN
5000.0000 [IU] | Freq: Three times a day (TID) | INTRAMUSCULAR | Status: DC
  Filled 2014-06-18 (×10): qty 1

## 2014-06-18 MED ORDER — AMOXICILLIN-POT CLAVULANATE 875-125 MG PO TABS
1.0000 | ORAL_TABLET | Freq: Two times a day (BID) | ORAL | Status: DC
  Administered 2014-06-18 – 2014-06-21 (×6): 1 via ORAL
  Filled 2014-06-18 (×8): qty 1

## 2014-06-18 MED ORDER — NON FORMULARY: 1.0000 | Freq: Two times a day (BID) | Status: DC

## 2014-06-18 MED ORDER — CLOPIDOGREL BISULFATE 75 MG PO TABS
75.0000 mg | ORAL_TABLET | Freq: Every morning | ORAL | Status: DC
  Administered 2014-06-19 – 2014-06-21 (×3): 75 mg via ORAL
  Filled 2014-06-18 (×3): qty 1

## 2014-06-18 MED ORDER — ONDANSETRON HCL 4 MG PO TABS: 4.0000 mg | ORAL_TABLET | Freq: Four times a day (QID) | ORAL | Status: DC | PRN

## 2014-06-18 MED ORDER — IRBESARTAN 75 MG PO TABS
75.0000 mg | ORAL_TABLET | Freq: Every day | ORAL | Status: DC
  Administered 2014-06-19 – 2014-06-21 (×3): 75 mg via ORAL
  Filled 2014-06-18 (×3): qty 1

## 2014-06-18 MED ORDER — MONTELUKAST SODIUM 10 MG PO TABS
10.0000 mg | ORAL_TABLET | Freq: Every day | ORAL | Status: DC
  Administered 2014-06-19 – 2014-06-21 (×3): 10 mg via ORAL
  Filled 2014-06-18 (×3): qty 1

## 2014-06-18 MED ORDER — FLUTICASONE-SALMETEROL 500-50 MCG/DOSE IN AEPB
1.0000 | INHALATION_SPRAY | Freq: Two times a day (BID) | RESPIRATORY_TRACT | Status: DC
  Filled 2014-06-18: qty 14

## 2014-06-18 MED ORDER — IPRATROPIUM BROMIDE 0.02 % IN SOLN
0.5000 mg | Freq: Once | RESPIRATORY_TRACT | Status: AC
  Administered 2014-06-18: 0.5 mg via RESPIRATORY_TRACT

## 2014-06-18 NOTE — H&P (Signed)
[facility] Admission History and Physical Service Pager: [phone]  Patient name: Judith Boyer Medical record number: [id number] Date of birth: [birth year] Age: 69 y.o. Gender: female  Primary Care Provider: Jenny Reichmann, MD Consultants: None Code Status: Full  Chief Complaint: Dyspnea  Assessment and Plan: ELYANA GRABSKI is a 69 y.o. female presenting with shortness of breath . PMH is significant for COPD, DM2, HTN, CAD with MI 2013  COPD: SOB c/w COPD exacerbation. Lack of fevers, normal WBC ( WBC 9.4) and negative CXR makes pneumonia or infectious process less likely. CXR neg for pulmonary process. - continue home advair - will admit for management of  COPD exacerbation - prednisone 60 mg daily starting tomorrow; methylprednisolone administered in ED - Augmentin started; pt allergic to doxycyline and currently has mildly elevated QTC, therefore not using FQ or azith - duonebs q6 (home med), albuterol q2prn - supplemental O2 as needed, will wean as tolerated. On bipap in the ED, will wean to home O2 and monitor closely, if decreased oxygen saturations <88% or increased work of breath, will add back Bipap  HTN- Stable - Continue home diovan  CAD/stenting 2009, left heart cath 2013- Stable, takes plavix chronically - admit to telemetry - continue to control blood pressures - continue home plavix 75 qD  DM2 Last A1c 5.6 05/01/2014. Hold metformin.  - SSI - follow cbg  HLD:  - continue home Pravachol 80 qD  Anxiety- takes xanax 0.25 QID PRN  for panic attacks, has not felt she needed it for last few months. Ativan is also on her list, but she states she does not take that.  - Xanax as needed, will not order for concern of over treating. May need if Bipap is required overnight.   FEN/GI: Reg diet (Per pt request)/ KVO Prophylaxis: plavix, hep sq  Disposition: Pending clinical improvemnt  History of Present Illness: Judith Boyer  is a 69 y.o. female presenting with shortness of breath and increased oxygen requirement that started approximately 5 days ago. She has a baseline O2 rquirement of 2L which she uses all of the time but has needed to increase it to 3 L occasionally. She has had a cough not productive of sputum. Denies chest pain, headache, fevers or chills. Denies abd pain,nausea or vomiting and has been able tolerate PO intake. She has been treated for a COPD exacerbation last month with prednisone and antibiotics. She also was admitted in January for similar presentation. She is allergic to [facility] and Spiriva, limiting her controlling agents.   Review Of Systems: Per HPI with the following additions:otherwise 12 point review of systems was performed and was unremarkable.  Patient Active Problem List   Diagnosis Date Noted  . B12 deficiency 02/10/2014  . Protein-calorie malnutrition, severe 02/09/2014  . Acute respiratory distress 02/08/2014  . Vitamin B 12 deficiency 06/28/2013  . Palliative care encounter 02/25/2013  . Weakness generalized 02/25/2013  . COPD exacerbation 02/20/2013  . CAP (community acquired pneumonia) 02/20/2013  . Anemia 02/20/2013  . Nocturnal hypoxemia 12/30/2012  . Chest pain at rest 02/09/2011  . CAD in native artery 02/09/2011  . COPD GOLD III 02/06/2011  . Smoker 02/06/2011  . Hypertension 02/06/2011  . Diabetes mellitus type 2, controlled, without complications 46/27/0350   Past Medical History: Past Medical History  Diagnosis Date  . COPD (chronic obstructive pulmonary disease)   . Hypertension   . Coronary artery disease   . Shortness of breath   .  High cholesterol   . Myocardial infarction 02/2011  . On home oxygen therapy     "2L; 24h/day" (02/08/2014)  . Pneumonia     "couple times" (02/08/2014)  . Type II diabetes mellitus     type 2  . Anemia   . History of blood transfusion 2014    "extremely anemic"   Past Surgical History: Past Surgical History  Procedure  Laterality Date  . Shoulder arthroscopy w/ rotator cuff repair Left   . Left heart catheterization with coronary angiogram N/A 02/09/2011    Procedure: LEFT HEART CATHETERIZATION WITH CORONARY ANGIOGRAM;  Surgeon: Birdie Riddle, MD;  Location: [facility];  Service: Cardiovascular;  Laterality: N/A;  . Tonsillectomy    . Abdominal hysterectomy  1970's  . Coronary angioplasty with stent placement  12/2007    "2"  . Cardiac catheterization  02/2011   Social History: History  Substance Use Topics  . Smoking status: Former Smoker -- 1.00 packs/day for 50 years    Types: Cigarettes    Quit date: 01/10/2013  . Smokeless tobacco: Never Used  . Alcohol Use: No   Additional social history: denies current smoking, h/o smoking for approx 50 years, denies etoh or drug use  Please also refer to relevant sections of EMR.  Family History: Family History  Problem Relation Age of Onset  . Heart disease Maternal Grandmother   . Brain cancer Brother   . Lung cancer Mother     smoked   Allergies and Medications: Allergies  Allergen Reactions  . Doxycycline Anaphylaxis  . Codeine Other (See Comments)    hyperactivity  . Latex   . Lisinopril Cough  . Spiriva Handihaler [Tiotropium Bromide Monohydrate]     Rash and Urinary Retention  . Adhesive [Tape] Rash  . Dulera [Mometasone Furo-Formoterol Fum] Rash   Current Facility-Administered Medications on File Prior to Encounter  Medication Dose Route Frequency Provider Last Rate Last Dose  . cyanocobalamin ((VITAMIN B-12)) injection 1,000 mcg  1,000 mcg Intramuscular Q30 days Dorian Heckle English, PA   1,000 mcg at 02/22/14 1029  . Influenza vac split quadrivalent PF (FLUARIX) injection 0.5 mL  0.5 mL Intramuscular Tomorrow-1000 [name], MD       Current Outpatient Prescriptions on File Prior to Encounter  Medication Sig Dispense Refill  . albuterol (PROAIR HFA) 108 (90 BASE) MCG/ACT inhaler Inhale 2 puffs into the lungs every 4 (four)  hours as needed for wheezing or shortness of breath. 1 Inhaler 11  . albuterol (PROVENTIL) (2.5 MG/3ML) 0.083% nebulizer solution Take 3 mLs (2.5 mg total) by nebulization every 4 (four) hours as needed for wheezing. 150 mL 11  . ALPRAZolam (XANAX) 0.25 MG tablet Take 1 tablet (0.25 mg total) by mouth 4 (four) times daily as needed for anxiety (Take every 4-6 hours AS NEEDED). You may take 1/2 tab 30 tablet 0  . clopidogrel (PLAVIX) 75 MG tablet Take 1 tablet (75 mg total) by mouth every morning. 30 tablet 11  . feeding supplement, GLUCERNA SHAKE, (GLUCERNA SHAKE) LIQD Take 237 mLs by mouth 2 (two) times daily between meals. 60 Can 5  . Fluticasone-Salmeterol (ADVAIR) 500-50 MCG/DOSE AEPB Inhale 1 puff into the lungs 2 (two) times daily. 180 each 11  . GuaiFENesin (MUCINEX PO) Take by mouth.    Marland Kitchen ipratropium-albuterol (DUONEB) 0.5-2.5 (3) MG/3ML SOLN Take four times daily 360 mL 11  . Iron TABS Take 1 tablet by mouth daily.    . metFORMIN (GLUCOPHAGE) 1000 MG tablet  Take 1 tablet (1,000 mg total) by mouth daily with breakfast. Take 1 tablet in the morning and 1 tablet in the evening 30 tablet 11  . montelukast (SINGULAIR) 10 MG tablet Take 1 tablet (10 mg total) by mouth daily. 30 tablet 11  . Multiple Vitamin (MULTIVITAMIN WITH MINERALS) TABS tablet Take 1 tablet by mouth daily.    Marland Kitchen NEEDLE, DISP, 30 G (BD DISP NEEDLES) 30G X 1/2" MISC Inject 1 mL B-12 subcutaneously once a month 100 each 0  . nitroGLYCERIN (NITROSTAT) 0.4 MG SL tablet Place 1 tablet (0.4 mg total) under the tongue every 5 (five) minutes x 3 doses as needed for chest pain. 25 tablet 1  . pravastatin (PRAVACHOL) 80 MG tablet Take 1 tablet (80 mg total) by mouth every evening. 30 tablet 11  . valsartan (DIOVAN) 80 MG tablet Take 1 tablet (80 mg total) by mouth daily. 30 tablet 5  . vitamin B-12 1000 MCG tablet Take 1 tablet (1,000 mcg total) by mouth daily. 30 tablet 1  . amoxicillin (AMOXIL) 500 MG capsule Take 1 capsule (500 mg  total) by mouth 2 (two) times daily. (Patient not taking: Reported on 06/18/2014) 14 capsule 0  . dextromethorphan-guaiFENesin (MUCINEX DM) 30-600 MG per 12 hr tablet Take 1 tablet by mouth every 12 (twelve) hours as needed. For cough    . LORazepam (ATIVAN) 0.5 MG tablet Take 1 tablet (0.5 mg total) by mouth 2 (two) times daily as needed for anxiety. 60 tablet 3  . predniSONE (DELTASONE) 10 MG tablet 40 mg po x 2 days, then 30mg  po x 2 days, then 20mg  po x 2 days, then 10mg  po x 2 days, 20 tablet 0  . Respiratory Therapy Supplies (FLUTTER) DEVI Use as directed 1 each 0    Objective: BP 168/65 mmHg  Pulse 115  Temp(Src) 98.3 F (36.8 C) (Oral)  Resp 20  Ht 5' 1.5" (1.562 m)  SpO2 100% Exam: General: NAD, CPAP in place, thin caucasian female, able to speak in full sentences.  Eyes: EOMI, non icteric, no conjunctivitis or drainage Neck: supple, no lad Cardiovascular: Tachycardia, regular rhythm, no murmurs auscultated Respiratory: Decreased breathsounds, normal effort, no wheeze or rales.  Abdomen: soft, flat, non-tender, non distended,  no organomegally Skin: normal,.no lesions or rashes noted, skin intact, wwp Neuro: AO, no focal deficits Psych: normal mood and affect  Labs and Imaging: CBC BMET   Recent Labs Lab 06/18/14 1740  WBC 9.4  HGB 12.5  HCT 38.8  PLT 306    Recent Labs Lab 06/18/14 1740  NA 139  K 3.9  CL 97*  CO2 30  BUN 11  CREATININE 0.48  GLUCOSE 151*  CALCIUM 9.2     CXR 5/13  FINDINGS: The heart size and mediastinal contours are within normal limits. No pneumothorax or pleural effusion is noted. Hyperinflation of the lungs is noted. Both lungs are clear. The visualized skeletal structures are unremarkable.  IMPRESSION: No acute cardiopulmonary abnormality seen.   Veatrice Bourbon, MD 06/18/2014, 7:20 PM PGY-1, [place] Intern pager: [phone], text pages welcome    I have examined the patient today with the team.  We have discussed the assessment and plan. The interns note above, has been altered to reflect the exam, assessment and plan.  Howard Pouch DO PGY3 [facility]

## 2014-06-18 NOTE — Progress Notes (Signed)
Per MD Raoul Pitch, one time order for insulin ordered according to ACHS sliding scale. 5 units ordered for CBG 287

## 2014-06-18 NOTE — ED Notes (Signed)
Dr ward at bedside.

## 2014-06-18 NOTE — ED Provider Notes (Signed)
TIME SEEN: 5:35 PM  CHIEF COMPLAINT: Shortness of breath, cough  HPI: Pt is a 69 y.o. female with history of COPD who is on 2 L of oxygen at home, hypertension, hyperlipidemia, CAD, diabetes who presents to the emergency department shortness of breath for the past week. Has had a dry cough that has now turned into a productive cough with green sputum. Denies fever. Denies chest pain. Denies lower extremity swelling or pain. Was seen at Dr. Perfecto Kingdom office today for COPD exacerbation and given albuterol. Given her respiratory distress she was sent to the ED by EMS.  ROS: See HPI Constitutional: no fever  Eyes: no drainage  ENT: no runny nose   Cardiovascular:  no chest pain  Resp:  SOB  GI: no vomiting GU: no dysuria Integumentary: no rash  Allergy: no hives  Musculoskeletal: no leg swelling  Neurological: no slurred speech ROS otherwise negative  PAST MEDICAL HISTORY/PAST SURGICAL HISTORY:  Past Medical History  Diagnosis Date  . COPD (chronic obstructive pulmonary disease)   . Hypertension   . Coronary artery disease   . Shortness of breath   . High cholesterol   . Myocardial infarction 02/2011  . On home oxygen therapy     "2L; 24h/day" (02/08/2014)  . Pneumonia     "couple times" (02/08/2014)  . Type II diabetes mellitus     type 2  . Anemia   . History of blood transfusion 2014    "extremely anemic"    MEDICATIONS:  Prior to Admission medications   Medication Sig Start Date End Date Taking? Authorizing Provider  albuterol (PROAIR HFA) 108 (90 BASE) MCG/ACT inhaler Inhale 2 puffs into the lungs every 4 (four) hours as needed for wheezing or shortness of breath. 01/14/14   [name], MD  albuterol (PROVENTIL) (2.5 MG/3ML) 0.083% nebulizer solution Take 3 mLs (2.5 mg total) by nebulization every 4 (four) hours as needed for wheezing. 01/14/14   [name], MD  ALPRAZolam Duanne Moron) 0.25 MG tablet Take 1 tablet (0.25 mg total) by mouth 4 (four) times daily as needed for  anxiety (Take every 4-6 hours AS NEEDED). You may take 1/2 tab 02/22/14   Dorian Heckle English, PA  amoxicillin (AMOXIL) 500 MG capsule Take 1 capsule (500 mg total) by mouth 2 (two) times daily. Patient not taking: Reported on 06/18/2014 05/01/14   [name], MD  clopidogrel (PLAVIX) 75 MG tablet Take 1 tablet (75 mg total) by mouth every morning. 06/28/13   [name], MD  dextromethorphan-guaiFENesin [facility] DM) 30-600 MG per 12 hr tablet Take 1 tablet by mouth every 12 (twelve) hours as needed. For cough    Historical Provider, MD  feeding supplement, GLUCERNA SHAKE, (GLUCERNA SHAKE) LIQD Take 237 mLs by mouth 2 (two) times daily between meals. 02/11/14   Annita Brod, MD  Fluticasone-Salmeterol (ADVAIR) 500-50 MCG/DOSE AEPB Inhale 1 puff into the lungs 2 (two) times daily. 01/14/14   [name], MD  GuaiFENesin (MUCINEX PO) Take by mouth.    Historical Provider, MD  ipratropium-albuterol (DUONEB) 0.5-2.5 (3) MG/3ML SOLN Take four times daily 01/14/14   [name], MD  Iron TABS Take 1 tablet by mouth daily.    Historical Provider, MD  LORazepam (ATIVAN) 0.5 MG tablet Take 1 tablet (0.5 mg total) by mouth 2 (two) times daily as needed for anxiety. 02/27/13   [name], MD  metFORMIN (GLUCOPHAGE) 1000 MG tablet Take 1 tablet (1,000 mg total) by mouth  daily with breakfast. Take 1 tablet in the morning and 1 tablet in the evening 05/01/14   [name], MD  montelukast (SINGULAIR) 10 MG tablet Take 1 tablet (10 mg total) by mouth daily. 06/28/13   [name], MD  Multiple Vitamin (MULTIVITAMIN WITH MINERALS) TABS tablet Take 1 tablet by mouth daily.    Historical Provider, MD  NEEDLE, DISP, 30 G (BD DISP NEEDLES) 30G X 1/2" MISC Inject 1 mL B-12 subcutaneously once a month 06/28/13   [name], MD  nitroGLYCERIN (NITROSTAT) 0.4 MG SL tablet Place 1 tablet (0.4 mg total) under the tongue every 5 (five) minutes x 3 doses as needed for chest pain. 02/07/12   Dixie Dials, MD   pravastatin (PRAVACHOL) 80 MG tablet Take 1 tablet (80 mg total) by mouth every evening. 06/28/13   [name], MD  predniSONE (DELTASONE) 10 MG tablet 40 mg po x 2 days, then 30mg  po x 2 days, then 20mg  po x 2 days, then 10mg  po x 2 days, 05/01/14   [name], MD  Respiratory Therapy Supplies (FLUTTER) DEVI Use as directed 01/13/13   Tanda Rockers, MD  Syringe, Disposable, 3 ML MISC Inject 1 mL B-12 subcutaneously once a month 04/23/13   [name], MD  valsartan (DIOVAN) 80 MG tablet Take 1 tablet (80 mg total) by mouth daily. 02/22/14   Dorian Heckle English, PA  vitamin B-12 1000 MCG tablet Take 1 tablet (1,000 mcg total) by mouth daily. 02/11/14   Annita Brod, MD    ALLERGIES:  Allergies  Allergen Reactions  . Doxycycline Anaphylaxis  . Codeine Other (See Comments)    hyperactivity  . Latex   . Lisinopril Cough  . Spiriva Handihaler [Tiotropium Bromide Monohydrate]     Rash and Urinary Retention  . Adhesive [Tape] Rash  . Dulera [Mometasone Furo-Formoterol Fum] Rash    SOCIAL HISTORY:  History  Substance Use Topics  . Smoking status: Former Smoker -- 1.00 packs/day for 50 years    Types: Cigarettes    Quit date: 01/10/2013  . Smokeless tobacco: Never Used  . Alcohol Use: No    FAMILY HISTORY: Family History  Problem Relation Age of Onset  . Heart disease Maternal Grandmother   . Brain cancer Brother   . Lung cancer Mother     smoked    EXAM: BP 156/70 mmHg  Pulse 108  Temp(Src) 98.3 F (36.8 C) (Oral)  Resp 23  Ht 5' 1.5" (1.562 m)  SpO2 99% CONSTITUTIONAL: Alert and oriented and responds appropriately to questions. Elderly, mild to moderate respiratory distress HEAD: Normocephalic EYES: Conjunctivae clear, PERRL ENT: normal nose; no rhinorrhea; moist mucous membranes; pharynx without lesions noted NECK: Supple, no meningismus, no LAD  CARD: Regular and tachycardic; S1 and S2 appreciated; no murmurs, no clicks, no rubs, no gallops RESP: Patient is  tachypneic, mild to moderate rest per distress, increased work of breathing, no air movement appreciated bilaterally, no wheezing or rhonchi or rales, no hypoxia on 4 L ABD/GI: Normal bowel sounds; non-distended; soft, non-tender, no rebound, no guarding, no peritoneal signs BACK:  The back appears normal and is non-tender to palpation, there is no CVA tenderness EXT: Normal ROM in all joints; non-tender to palpation; no edema; normal capillary refill; no cyanosis, no calf tenderness or swelling    SKIN: Normal color for age and race; warm NEURO: Moves all extremities equally, sensation to light touch intact diffusely, cranial nerves II through XII  intact PSYCH: The patient's mood and manner are appropriate. Grooming and personal hygiene are appropriate.  MEDICAL DECISION MAKING: Patient here with mild to moderate respiratory distress likely from COPD exacerbation. We'll obtain labs, chest x-ray, give continuous albuterol, Solu-Medrol. We'll also obtain ABG. Anticipate patient will need admission. She will likely need BiPAP given her level of discomfort and increased work of breathing, distress.  ED PROGRESS: Patient's ABG shows rest or acidosis with increased PCO2. We'll place on BiPAP. I'll continue to closely monitor.    Patient's lungs have improved and she now has increasing aeration. We'll attempt to wean BiPAP as appropriate. Discussed with family medicine resident for admission to step down. Attending is Dr. Ree Kida.  Her chest x-ray is clear. Her labs are unremarkable quitting negative troponin and normal BNP.     EKG Interpretation  Date/Time:  Friday Jun 18 2014 17:20:39 EDT Ventricular Rate:  111 PR Interval:  132 QRS Duration: 79 QT Interval:  375 QTC Calculation: 510 R Axis:   86 Text Interpretation:  Sinus tachycardia Borderline right axis deviation Minimal ST elevation, inferior leads Prolonged QT interval Baseline wander in lead(s) V4 No significant change since last  tracing Confirmed by Hameed Kolar,  DO, Rosanna Bickle ([id number]) on 06/18/2014 5:37:39 PM         CRITICAL CARE Performed by: Nyra Jabs   Total critical care time: 45 minutes  Critical care time was exclusive of separately billable procedures and treating other patients.  Critical care was necessary to treat or prevent imminent or life-threatening deterioration.  Critical care was time spent personally by me on the following activities: development of treatment plan with patient and/or surrogate as well as nursing, discussions with consultants, evaluation of patient's response to treatment, examination of patient, obtaining history from patient or surrogate, ordering and performing treatments and interventions, ordering and review of laboratory studies, ordering and review of radiographic studies, pulse oximetry and re-evaluation of patient's condition.    [place], DO 06/18/14 1921

## 2014-06-18 NOTE — Progress Notes (Signed)
   Subjective:    Patient ID: Judith Boyer, female    DOB: [birth year], 69 y.o.   MRN: [id number] This chart was scribed for [name], MD by Marti Sleigh, Medical Scribe. This patient was seen in Room 6 and the patient's care was started at 4:06 PM.  Chief Complaint  Patient presents with  . Shortness of Breath    x several days.      HPI HPI Comments: Judith Boyer is a 69 y.o. female with a hx of end-stage COPD who presents to [facility] complaining of increased SOB for the last three days. Last seen 05/01/2014, at that time treated with prednisone and amoxicillin with improvement. Pt also has a hx of DM, under control. Pt denies fever chills, purulent sputum. According to her son she has been using her on-demand oxygen, as well as her O2 by canister at the same time. She is unable to do any thing without being on oxygen. She has had significant weight loss with difficulty eating.     Review of Systems  Constitutional: Negative for fever and chills.  Respiratory: Positive for chest tightness, shortness of breath and wheezing.   Cardiovascular: Negative for leg swelling.       Objective:   Physical Exam  Constitutional: She is oriented to person, place, and time. She appears well-developed and well-nourished. No distress.  Pt enters clinic emergently with SOB and difficulty breathing. She was immediately started on neb at O2, three liters.  HENT:  Head: Normocephalic and atraumatic.  Eyes: Pupils are equal, round, and reactive to light.  Neck: Neck supple.  Cardiovascular: Normal rate.   Tachycardia, no murmer herd. Extremities w/o edema.  Pulmonary/Chest: Effort normal. No respiratory distress.  Increased AP diameter, very poor air exchange with prologed expiration.   Musculoskeletal: Normal range of motion.  Neurological: She is alert and oriented to person, place, and time. Coordination normal.  Skin: Skin is warm and dry. She is not diaphoretic.  Psychiatric: She  has a normal mood and affect. Her behavior is normal.  Nursing note and vitals reviewed.  Results for orders placed or performed in visit on 06/18/14  POCT glucose (manual entry)  Result Value Ref Range   POC Glucose 174 (A) 70 - 99 mg/dl     ASSESSMENT AND PLAN    Patient enters with a flare of her COPD. According to her son she cannot do any walking at home at all without extreme shortness of breath she has been using her O2 concentrator and canister 02 together. She is short of breath with any exertion. For the last 48 hours this has become much worse. She has been using all of her medications. She was given one treatment in the office with minimal clearing. She is sent to the hospital for evaluation for admission for COPD flare to consider IV steroids. I personally performed the services described in this documentation, which was scribed in my presence. The recorded information has been reviewed and is accurate.  Nena Jordan, MD

## 2014-06-18 NOTE — ED Notes (Signed)
Pt sent from Dr Perfecto [place] office for COPD exacerbation.  Per EMS, no wheezing noted.  O2 increased from her normal 2L to 4L [place] and pt states decreased sob, though breathing is still labored.

## 2014-06-19 DIAGNOSIS — E8729 Other acidosis: Secondary | ICD-10-CM | POA: Insufficient documentation

## 2014-06-19 DIAGNOSIS — I2510 Atherosclerotic heart disease of native coronary artery without angina pectoris: Secondary | ICD-10-CM | POA: Diagnosis not present

## 2014-06-19 DIAGNOSIS — E119 Type 2 diabetes mellitus without complications: Secondary | ICD-10-CM | POA: Diagnosis not present

## 2014-06-19 DIAGNOSIS — E872 Acidosis: Secondary | ICD-10-CM | POA: Diagnosis not present

## 2014-06-19 DIAGNOSIS — F419 Anxiety disorder, unspecified: Secondary | ICD-10-CM | POA: Diagnosis not present

## 2014-06-19 DIAGNOSIS — I10 Essential (primary) hypertension: Secondary | ICD-10-CM | POA: Diagnosis not present

## 2014-06-19 DIAGNOSIS — J441 Chronic obstructive pulmonary disease with (acute) exacerbation: Secondary | ICD-10-CM | POA: Diagnosis not present

## 2014-06-19 LAB — GLUCOSE, CAPILLARY
GLUCOSE-CAPILLARY: 267 mg/dL — AB (ref 65–99)
Glucose-Capillary: 236 mg/dL — ABNORMAL HIGH (ref 65–99)
Glucose-Capillary: 253 mg/dL — ABNORMAL HIGH (ref 65–99)
Glucose-Capillary: 274 mg/dL — ABNORMAL HIGH (ref 65–99)

## 2014-06-19 MED ORDER — INSULIN ASPART 100 UNIT/ML ~~LOC~~ SOLN
0.0000 [IU] | Freq: Every day | SUBCUTANEOUS | Status: DC
Start: 2014-06-19 — End: 2014-06-21
  Administered 2014-06-19: 3 [IU] via SUBCUTANEOUS
  Administered 2014-06-20: 2 [IU] via SUBCUTANEOUS

## 2014-06-19 MED ORDER — IPRATROPIUM-ALBUTEROL 0.5-2.5 (3) MG/3ML IN SOLN
3.0000 mL | RESPIRATORY_TRACT | Status: DC
  Administered 2014-06-19 – 2014-06-21 (×14): 3 mL via RESPIRATORY_TRACT
  Filled 2014-06-19 (×15): qty 3

## 2014-06-19 MED ORDER — ALPRAZOLAM 0.25 MG PO TABS: 0.2500 mg | ORAL_TABLET | Freq: Two times a day (BID) | ORAL | Status: DC | PRN

## 2014-06-19 MED ORDER — LEVALBUTEROL HCL 1.25 MG/0.5ML IN NEBU
1.2500 mg | INHALATION_SOLUTION | RESPIRATORY_TRACT | Status: DC | PRN
  Administered 2014-06-19 – 2014-06-21 (×4): 1.25 mg via RESPIRATORY_TRACT
  Filled 2014-06-19 (×9): qty 0.5

## 2014-06-19 MED ORDER — FLUTICASONE-SALMETEROL 250-50 MCG/DOSE IN AEPB
1.0000 | INHALATION_SPRAY | Freq: Two times a day (BID) | RESPIRATORY_TRACT | Status: DC
  Administered 2014-06-19: 1 via RESPIRATORY_TRACT

## 2014-06-19 MED ORDER — INSULIN ASPART 100 UNIT/ML ~~LOC~~ SOLN: 0.0000 [IU] | Freq: Three times a day (TID) | SUBCUTANEOUS | Status: DC

## 2014-06-19 NOTE — Progress Notes (Signed)
Family Medicine Teaching Service Daily Progress Note Intern Pager: [phone]  Patient name: Judith Boyer Medical record number: [id number] Date of birth: 17-Oct-1945 Age: 69 y.o. Gender: female  Primary Care Provider: Jenny Reichmann, MD Consultants: None Code Status: Full code  Pt Overview and Major Events to Date:  5/13: admitted to step down for dyspnea on Bipap  Assessment and Plan: Judith Boyer is a 69 y.o. female presenting with shortness of breath . PMH is significant for COPD, DM2, HTN, CAD with MI 2013  COPD: SOB c/w COPD exacerbation. Lack of fevers, normal WBC ( WBC 9.4) and negative CXR makes pneumonia or infectious process less likely. CXR neg for pulmonary process. - continue home advair - prednisone 60 mg daily starting 5/14; methylprednisolone administered in ED - Augmentin started; pt allergic to doxycyline and currently has mildly elevated QTC, therefore not using FQ or azith - duonebs q4 (home med), xopenex  q2prn - supplemental O2 as needed, will wean as tolerated. On bipap in the ED, will wean to home O2 and monitor closely, if decreased oxygen saturations <88% or increased work of breath, will add back Bipap - pt did not need to be on Bipap overnight. patient on 3L [place] this morning with good saturations.  - She is allergic to Spiriva, could consider same class med or chronic low dose steroid to aid in control.   Tachycardic: pt with tachycardia today 122. Review of past OV, show that she is ~115 at baseline. Could also be secondary to albuterol use. - may benefit from cardioselective BB for HR control if she can tolerate it - repeat EKG this am  HTN- Stable - Continue home diovan  CAD/stenting 2009, left heart cath 2013- Stable, takes plavix chronically - admit to telemetry - continue to control blood pressures - continue home plavix 75 qD - repeat EKG today.   DM2 Last A1c 5.6 05/01/2014. Hold metformin.  - SSI - follow cbg  HLD:  - continue  home Pravachol 80 qD  Anxiety- takes xanax 0.25 QID PRN for panic attacks, has not felt she needed it for last few months. Ativan is also on her list, but she states she does not take that.  - Xanax as needed BID. Refrain from using unless she request it.   FEN/GI: Reg diet (Per pt request)/ KVO Prophylaxis: plavix, hep sq (refusing hep)>> SCD  Disposition: Pending clinical improvement and oxygen requirement return to baseline  Subjective:  Patient reports she is feeling better this morning, but her chest still feel tight and she does not feel like she is moving good air. She denies increase anxiety this morning. She is hungry and waiting on her breakfast.   Objective: Temp:  [97.7 F (36.5 C)-98.3 F (36.8 C)] 97.7 F (36.5 C) (05/14 0400) Pulse Rate:  [102-118] 112 (05/14 0500) Resp:  [14-27] 18 (05/14 0500) BP: (123-177)/(44-104) 129/44 mmHg (05/14 0500) SpO2:  [93 %-100 %] 98 % (05/14 0500) FiO2 (%):  [40 %] 40 % (05/13 1922) Weight:  [94 lb 9.2 oz (42.9 kg)] 94 lb 9.2 oz (42.9 kg) (05/13 2100) Physical Exam: General: NAD,  thin caucasian female, able to speak in full sentences. 3LNC Eyes: EOMI, non icteric, no conjunctivitis or drainage Neck: supple, no lad Cardiovascular: Tachycardia, regular rhythm, no murmurs auscultated Respiratory: Decreased breathsounds, normal effort, no wheeze or rales.  Abdomen: soft, flat, non-tender, non distended, no organomegally Skin: normal,.no lesions or rashes noted, skin intact, wwp Neuro: AO, no focal deficits Psych: mildly  anxious    Laboratory:  Recent Labs Lab 06/18/14 1740  WBC 9.4  HGB 12.5  HCT 38.8  PLT 306    Recent Labs Lab 06/18/14 1740  NA 139  K 3.9  CL 97*  CO2 30  BUN 11  CREATININE 0.48  CALCIUM 9.2  PROT 6.9  BILITOT 0.1*  ALKPHOS 85  ALT 18  AST 20  GLUCOSE 151*    CBG (last 3)   Recent Labs  06/18/14 2252  GLUCAP 287*   Imaging/Diagnostic Tests: CXR 5/13  FINDINGS: The heart size  and mediastinal contours are within normal limits. No pneumothorax or pleural effusion is noted. Hyperinflation of the lungs is noted. Both lungs are clear. The visualized skeletal structures are unremarkable.  IMPRESSION: No acute cardiopulmonary abnormality seen.  Ma Hillock, DO 06/19/2014, 6:52 AM PGY-3, [place] Intern pager: [phone], text pages welcome

## 2014-06-19 NOTE — Evaluation (Signed)
Physical Therapy Evaluation Patient Details Name: Judith Boyer MRN: [id number] DOB: Jun 11, 1945 Today's Date: 06/19/2014   History of Present Illness  Judith Boyer is a 69 y.o. female presenting with shortness of breath . PMH is significant for COPD, DM2, HTN, CAD with MI 2013. Chest x-ray neg.  Clinical Impression  Pt admitted with above diagnosis. Pt currently with functional limitations due to the deficits listed below (see PT Problem List). Pt fatigues very quickly with activity and reports that she has not fully recovered from last hospitalization. Min A needed to steady with ambulation. O2 sats 92% on 2L, HR up into low 120's.  Pt will benefit from skilled PT to increase their independence and safety with mobility to allow discharge to the venue listed below.       Follow Up Recommendations Home health PT;Supervision for mobility/OOB    Equipment Recommendations  Other (comment) (TBD)    Recommendations for Other Services       Precautions / Restrictions Precautions Precautions: Other (comment) Precaution Comments: watch O2 sats Restrictions Weight Bearing Restrictions: No      Mobility  Bed Mobility Overal bed mobility: Modified Independent             General bed mobility comments: increased time but no physical assist needed  Transfers Overall transfer level: Needs assistance Equipment used: None Transfers: Sit to/from Stand Sit to Stand: Min assist;Min guard         General transfer comment: min A for first stand, min-guard from toilet with grab bar  Ambulation/Gait Ambulation/Gait assistance: Min assist Ambulation Distance (Feet): 15 Feet Assistive device: None Gait Pattern/deviations: Step-through pattern;Decreased stride length Gait velocity: decreased Gait velocity interpretation: Below normal speed for age/gender General Gait Details: min A to steady  Science writer    Modified Rankin (Stroke  Patients Only)       Balance Overall balance assessment: Needs assistance Sitting-balance support: No upper extremity supported;Feet supported Sitting balance-Leahy Scale: Good     Standing balance support: No upper extremity supported;During functional activity Standing balance-Leahy Scale: Fair Standing balance comment: can maintain balance without UE support for pericare but becomes unsteady with increased HR                             Pertinent Vitals/Pain Pain Assessment: No/denies pain    Home Living Family/patient expects to be discharged to:: Private residence Living Arrangements: Alone Available Help at Discharge: Family;Available 24 hours/day Type of Home: House Home Access: Stairs to enter Entrance Stairs-Rails: Right Entrance Stairs-Number of Steps: 3 Home Layout: One level Home Equipment: Walker - 2 wheels;Shower seat;Tub bench Additional Comments: son stays with her at night     Prior Function Level of Independence: Independent         Comments: uses 2L O2      Hand Dominance        Extremity/Trunk Assessment   Upper Extremity Assessment: Defer to OT evaluation           Lower Extremity Assessment: Generalized weakness;RLE deficits/detail;LLE deficits/detail RLE Deficits / Details: pt with initial muscular contraction 4/5 but cannot hold contraction for more than 2-3 sec before breaking, quick fatigue of all musculature LLE Deficits / Details: same as RLE  Cervical / Trunk Assessment: Normal  Communication   Communication: No difficulties  Cognition Arousal/Alertness: Awake/alert Behavior During Therapy: WFL for tasks assessed/performed Overall Cognitive  Status: Within Functional Limits for tasks assessed                      General Comments General comments (skin integrity, edema, etc.): pt felt SOB with ambulation, HR up to 121 bpm, O2 sats 92% on 2L O2. Discussed pt obtaining home O2 sat monitor for biofeedback  and decreasing anxiety    Exercises        Assessment/Plan    PT Assessment Patient needs continued PT services  PT Diagnosis Difficulty walking;Generalized weakness   PT Problem List Decreased strength;Decreased activity tolerance;Decreased balance;Decreased mobility;Cardiopulmonary status limiting activity  PT Treatment Interventions DME instruction;Gait training;Stair training;Functional mobility training;Therapeutic activities;Therapeutic exercise;Balance training;Patient/family education   PT Goals (Current goals can be found in the Care Plan section) Acute Rehab PT Goals Patient Stated Goal: breathe better PT Goal Formulation: With patient Time For Goal Achievement: 07/03/14 Potential to Achieve Goals: Fair    Frequency Min 3X/week   Barriers to discharge        Co-evaluation               End of Session Equipment Utilized During Treatment: Gait belt;Oxygen Activity Tolerance: Patient tolerated treatment well Patient left: in bed;with call bell/phone within reach;with family/visitor present Nurse Communication: Mobility status    Functional Assessment Tool Used: clinical judgement Functional Limitation: Mobility: Walking and moving around Mobility: Walking and Moving Around Current Status (C9507): At least 1 percent but less than 20 percent impaired, limited or restricted Mobility: Walking and Moving Around Goal Status [phone]): 0 percent impaired, limited or restricted    Time: 0518-3358 PT Time Calculation (min) (ACUTE ONLY): 16 min   Charges:   PT Evaluation $Initial PT Evaluation Tier I: 1 Procedure     PT G Codes:   PT G-Codes **NOT FOR INPATIENT CLASS** Functional Assessment Tool Used: clinical judgement Functional Limitation: Mobility: Walking and moving around Mobility: Walking and Moving Around Current Status (I5189): At least 1 percent but less than 20 percent impaired, limited or restricted Mobility: Walking and Moving Around Goal Status  [phone]): 0 percent impaired, limited or restricted   Leighton Roach, [place], [place] 06/19/2014, 1:49 PM

## 2014-06-20 DIAGNOSIS — R000 Tachycardia, unspecified: Secondary | ICD-10-CM

## 2014-06-20 DIAGNOSIS — I2510 Atherosclerotic heart disease of native coronary artery without angina pectoris: Secondary | ICD-10-CM | POA: Diagnosis not present

## 2014-06-20 DIAGNOSIS — J441 Chronic obstructive pulmonary disease with (acute) exacerbation: Secondary | ICD-10-CM | POA: Diagnosis not present

## 2014-06-20 DIAGNOSIS — E119 Type 2 diabetes mellitus without complications: Secondary | ICD-10-CM | POA: Diagnosis not present

## 2014-06-20 DIAGNOSIS — F419 Anxiety disorder, unspecified: Secondary | ICD-10-CM | POA: Diagnosis not present

## 2014-06-20 LAB — CBC
HCT: 35.2 % — ABNORMAL LOW (ref 36.0–46.0)
Hemoglobin: 11.5 g/dL — ABNORMAL LOW (ref 12.0–15.0)
MCH: 29.9 pg (ref 26.0–34.0)
MCHC: 32.7 g/dL (ref 30.0–36.0)
MCV: 91.4 fL (ref 78.0–100.0)
Platelets: 269 10*3/uL (ref 150–400)
RBC: 3.85 MIL/uL — AB (ref 3.87–5.11)
RDW: 12.3 % (ref 11.5–15.5)
WBC: 11.3 10*3/uL — AB (ref 4.0–10.5)

## 2014-06-20 LAB — BASIC METABOLIC PANEL
Anion gap: 10 (ref 5–15)
BUN: 16 mg/dL (ref 6–20)
CALCIUM: 9 mg/dL (ref 8.9–10.3)
CHLORIDE: 96 mmol/L — AB (ref 101–111)
CO2: 33 mmol/L — ABNORMAL HIGH (ref 22–32)
Creatinine, Ser: 0.66 mg/dL (ref 0.44–1.00)
GFR calc Af Amer: >60 mL/min (ref >60)
Glucose, Bld: 148 mg/dL — ABNORMAL HIGH (ref 65–99)
Potassium: 3.8 mmol/L (ref 3.5–5.1)
Sodium: 139 mmol/L (ref 135–145)

## 2014-06-20 LAB — GLUCOSE, CAPILLARY
GLUCOSE-CAPILLARY: 140 mg/dL — AB (ref 65–99)
GLUCOSE-CAPILLARY: 332 mg/dL — AB (ref 65–99)
Glucose-Capillary: 186 mg/dL — ABNORMAL HIGH (ref 65–99)
Glucose-Capillary: 242 mg/dL — ABNORMAL HIGH (ref 65–99)

## 2014-06-20 MED ORDER — BISOPROLOL FUMARATE 5 MG PO TABS: 2.5000 mg | ORAL_TABLET | Freq: Once | ORAL | Status: DC

## 2014-06-20 MED ORDER — BISOPROLOL FUMARATE 5 MG PO TABS
5.0000 mg | ORAL_TABLET | Freq: Every day | ORAL | Status: DC
  Administered 2014-06-21: 5 mg via ORAL
  Filled 2014-06-20: qty 1

## 2014-06-20 MED ORDER — AMOXICILLIN-POT CLAVULANATE 875-125 MG PO TABS: 1.0000 | ORAL_TABLET | Freq: Two times a day (BID) | ORAL | Status: DC

## 2014-06-20 MED ORDER — PREDNISONE 20 MG PO TABS: 60.0000 mg | ORAL_TABLET | Freq: Every day | ORAL | Status: DC

## 2014-06-20 MED ORDER — BISOPROLOL FUMARATE 5 MG PO TABS
2.5000 mg | ORAL_TABLET | Freq: Once | ORAL | Status: AC
  Administered 2014-06-20: 2.5 mg via ORAL
  Filled 2014-06-20: qty 0.5

## 2014-06-20 MED ORDER — BISOPROLOL FUMARATE 5 MG PO TABS
2.5000 mg | ORAL_TABLET | Freq: Every day | ORAL | Status: DC
  Administered 2014-06-20: 2.5 mg via ORAL
  Filled 2014-06-20 (×2): qty 0.5

## 2014-06-20 MED ORDER — INSULIN ASPART 100 UNIT/ML ~~LOC~~ SOLN
0.0000 [IU] | Freq: Three times a day (TID) | SUBCUTANEOUS | Status: DC
  Administered 2014-06-20: 11 [IU] via SUBCUTANEOUS
  Administered 2014-06-21: 2 [IU] via SUBCUTANEOUS
  Administered 2014-06-21: 5 [IU] via SUBCUTANEOUS

## 2014-06-20 MED ORDER — FLUTICASONE-SALMETEROL 500-50 MCG/DOSE IN AEPB
1.0000 | INHALATION_SPRAY | Freq: Two times a day (BID) | RESPIRATORY_TRACT | Status: DC
  Administered 2014-06-20 – 2014-06-21 (×2): 1 via RESPIRATORY_TRACT

## 2014-06-20 NOTE — Progress Notes (Signed)
First attempt to give report futile, 3E RN checking blood with another Therapist, sports. Left phone number.  2nd attempt to give report also futile, 3 E RN in the process of discharging another patient. Gave RN number to call back. Will cont to monitor closely.

## 2014-06-20 NOTE — Progress Notes (Signed)
Pt has scheduled Heparin 5,000 units. Attempt to give pt med, but pt refused saying" I don't want that, it makes me bleed, then I would need a transfusion". Pt educated on its importance. Will cont to monitor closely.

## 2014-06-20 NOTE — Progress Notes (Signed)
Pt arrived to the unit place on tele, oriented to the room no needs at this time.

## 2014-06-20 NOTE — Discharge Summary (Signed)
[facility] Discharge Summary  Patient name: Judith Boyer Medical record number: [id number] Date of birth: 09-Nov-1945 Age: 69 y.o. Gender: female Date of Admission: 06/18/2014  Date of Discharge: 06/20/2014 Admitting Physician: Lupita Dawn, MD  Primary Care Provider: Jenny Reichmann, MD Consultants: None  Indication for Hospitalization: COPD Exacerbation.   Discharge Diagnoses/Problem List:  COPD GOLD III C-D.  DMII HTN CAD MI (2013)  Disposition: Home with Home Health.   Discharge Condition: Stable  Discharge Exam:  Gen: NAD, AAOx3 Head: NCAT Eyes: PERRLA N/T: O/P clear, nares patent bilaterally.  CV: Tachycardic, Regular rhythm, Normal S1/S2, no murmurs, gallops or rubs.  Resp: No crackles, rales, or wheezes, poor air movement, distant sounds likely 2/2 hyperinflation. Appropriate rate, unlabored.  Abd: S, Nt, ND, +BS.  Ext: WWP, 2+ distal pulses Skin: No rashes, no lesions.   Brief Hospital Course:  Judith Boyer is a 69 y.o. female presenting with COPD exacerbation and BIPAP requirement for hypoxia in the ED. PMH is significant for COPD, DM2, HTN, CAD with MI 2013  Pt. Presented to the ED with increased work of breathing and hypoxia. She is on 2L of O2 at home, but had been requiring more oxygen at admission. She had no fevers, and a normal WBC. Her CXR was without infiltrates or concern for acute process. She was started on Prednisone, continued on her home Advair, and was started on Augmentin for empiric CAP coverage due to multiple drug allergies and prolonged QTc to 510 on her EKG at admission. Her QTc did normalize on repeat EKG, however she was continued on Augmentin as she was improving overall. She was administered Duonebs every 4 hours. She became tachycardic due to the albuterol administration, and was switched to Xopenex during this hospitalization. She did not receive at discharge because she says that she has had issues  getting her insurance to pay for it in the past. She has also been tried on Spiriva in the past, but experienced urinary retention and was unable to tolerate it for this reason. On day 4 of her hospitalization, her respiratory status had improved to the point of being back on her home 2L of O2 and feeling back to her baseline. She was found to be safe and stable for discharge with close follow up with her PCP and continuation of her Prednisone and Augmentin for a total 5 day course.   Tachycardia - pt. With persistent baseline tachycardia predominantly with ambulation, but also occurring at rest. EKG without acute changes, and sinus tachycardia. No anxiety with her elevated HR per patient. She says that her baseline HR is usually over 100. Pt. Started on Bisoprolol prior to discharge for HR control. May discuss continuation of this with her PCP upon follow up.   Other Chronic Problems Managed During this Hospitalization:  HTN - stable and continued on home Diovan CAD s/p Stent 2009 - Continued on home plavix 75mg  qd.  DMII - Metformin held and she was placed on sliding scale insulin during this admission. Metformin continued at discharge.  HLD - Continued on pravachol 80mg  qd. Though would be a candidate for high intensity therapy if able to tolerate.  Anxiety - Pt. Takes xanax 0.25mg  QID prn at home, but has not needed for some time. Does not take Ativan though it is on her med list. She did not require any anxiolytics during this hospitalization.    Issues for Follow Up:  1. Follow up respiratory status and titration  of home COPD regimen for maximal control. May benefit from pulmonary rehab.  2. If able to tolerate, may benefit from high intensity Statin therapy given CAD s/p Stent and DMII.   Significant Procedures: None  Significant Labs and Imaging:   Recent Labs Lab 06/18/14 1740 06/20/14 0922  WBC 9.4 11.3*  HGB 12.5 11.5*  HCT 38.8 35.2*  PLT 306 269    Recent Labs Lab  06/18/14 1740 06/20/14 0922  NA 139 139  K 3.9 3.8  CL 97* 96*  CO2 30 33*  GLUCOSE 151* 148*  BUN 11 16  CREATININE 0.48 0.66  CALCIUM 9.2 9.0  ALKPHOS 85  --   AST 20  --   ALT 18  --   ALBUMIN 3.8  --    ABG    Component Value Date/Time   PHART 7.310* 06/18/2014 1759   PCO2ART 63.7* 06/18/2014 1759   PO2ART 257.0* 06/18/2014 1759   HCO3 32.1* 06/18/2014 1759   TCO2 34 06/18/2014 1759   ACIDBASEDEF 1.0 02/04/2011 1735   O2SAT 100.0 06/18/2014 1759   CXR 5/13:  FINDINGS: The heart size and mediastinal contours are within normal limits. No pneumothorax or pleural effusion is noted. Hyperinflation of the lungs is noted. Both lungs are clear. The visualized skeletal structures are unremarkable.  IMPRESSION: No acute cardiopulmonary abnormality seen.  EKG 5/14: QTC 448 from 510 previously. No acute changes. Sinus Tachycardia.   Results/Tests Pending at Time of Discharge: None  Discharge Medications:    Medication List    STOP taking these medications        amoxicillin 500 MG capsule  Commonly known as:  AMOXIL     ipratropium-albuterol 0.5-2.5 (3) MG/3ML Soln  Commonly known as:  DUONEB     multivitamin with minerals Tabs tablet      TAKE these medications        albuterol 108 (90 BASE) MCG/ACT inhaler  Commonly known as:  PROAIR HFA  Inhale 2 puffs into the lungs every 4 (four) hours as needed for wheezing or shortness of breath.     albuterol (2.5 MG/3ML) 0.083% nebulizer solution  Commonly known as:  PROVENTIL  Take 3 mLs (2.5 mg total) by nebulization every 4 (four) hours as needed for wheezing.     ALPRAZolam 0.25 MG tablet  Commonly known as:  XANAX  Take 1 tablet (0.25 mg total) by mouth 4 (four) times daily as needed for anxiety (Take every 4-6 hours AS NEEDED). You may take 1/2 tab     amoxicillin-clavulanate 875-125 MG per tablet  Commonly known as:  AUGMENTIN  Take 1 tablet by mouth every 12 (twelve) hours.     bisoprolol 5 MG tablet   Commonly known as:  ZEBETA  Take 1 tablet (5 mg total) by mouth daily.     clopidogrel 75 MG tablet  Commonly known as:  PLAVIX  Take 1 tablet (75 mg total) by mouth every morning.     cyanocobalamin 1000 MCG tablet  Take 1 tablet (1,000 mcg total) by mouth daily.     dextromethorphan-guaiFENesin 30-600 MG per 12 hr tablet  Commonly known as:  MUCINEX DM  Take 1 tablet by mouth every 12 (twelve) hours as needed. For cough     feeding supplement (GLUCERNA SHAKE) Liqd  Take 237 mLs by mouth 2 (two) times daily between meals.     Fluticasone-Salmeterol 500-50 MCG/DOSE Aepb  Commonly known as:  ADVAIR  Inhale 1 puff into the lungs 2 (two) times  daily.     FLUTTER Devi  Use as directed     Iron Tabs  Take 1 tablet by mouth daily.     LORazepam 0.5 MG tablet  Commonly known as:  ATIVAN  Take 1 tablet (0.5 mg total) by mouth 2 (two) times daily as needed for anxiety.     metFORMIN 1000 MG tablet  Commonly known as:  GLUCOPHAGE  Take 1 tablet (1,000 mg total) by mouth daily with breakfast. Take 1 tablet in the morning and 1 tablet in the evening     montelukast 10 MG tablet  Commonly known as:  SINGULAIR  Take 1 tablet (10 mg total) by mouth daily.     MUCINEX PO  Take by mouth.     NEEDLE (DISP) 30 G 30G X 1/2" Misc  Commonly known as:  BD DISP NEEDLES  Inject 1 mL B-12 subcutaneously once a month     nitroGLYCERIN 0.4 MG SL tablet  Commonly known as:  NITROSTAT  Place 1 tablet (0.4 mg total) under the tongue every 5 (five) minutes x 3 doses as needed for chest pain.     pravastatin 80 MG tablet  Commonly known as:  PRAVACHOL  Take 1 tablet (80 mg total) by mouth every evening.     predniSONE 20 MG tablet  Commonly known as:  DELTASONE  Take 3 tablets (60 mg total) by mouth daily with breakfast.     valsartan 80 MG tablet  Commonly known as:  DIOVAN  Take 1 tablet (80 mg total) by mouth daily.        Discharge Instructions: Please refer to Patient  Instructions section of EMR for full details.  Patient was counseled important signs and symptoms that should prompt return to medical care, changes in medications, dietary instructions, activity restrictions, and follow up appointments.   Follow-Up Appointments: Follow-up Information    Follow up with DAUB, STEVE A, MD. Schedule an appointment as soon as possible for a visit in 1 week.   Specialty:  Family Medicine   Why:  Hospital Follow Up    Contact information:   [place] [address] [phone]       Aquilla Hacker, MD 06/21/2014, 9:39 AM PGY-1, [place]

## 2014-06-20 NOTE — Discharge Instructions (Signed)
Chronic Obstructive Pulmonary Disease  Chronic obstructive pulmonary disease (COPD) is a common lung condition in which airflow from the lungs is limited. COPD is a general term that can be used to describe many different lung problems that limit airflow, including both chronic bronchitis and emphysema. If you have COPD, your lung function will probably never return to normal, but there are measures you can take to improve lung function and make yourself feel better.   CAUSES    Smoking (common).    Exposure to secondhand smoke.    Genetic problems.   Chronic inflammatory lung diseases or recurrent infections.  SYMPTOMS    Shortness of breath, especially with physical activity.    Deep, persistent (chronic) cough with a large amount of thick mucus.    Wheezing.    Rapid breaths (tachypnea).    Gray or bluish discoloration (cyanosis) of the skin, especially in fingers, toes, or lips.    Fatigue.    Weight loss.    Frequent infections or episodes when breathing symptoms become much worse (exacerbations).    Chest tightness.  DIAGNOSIS   Your health care provider will take a medical history and perform a physical examination to make the initial diagnosis. Additional tests for COPD may include:    Lung (pulmonary) function tests.   Chest X-ray.   CT scan.   Blood tests.  TREATMENT   Treatment available to help you feel better when you have COPD includes:    Inhaler and nebulizer medicines. These help manage the symptoms of COPD and make your breathing more comfortable.   Supplemental oxygen. Supplemental oxygen is only helpful if you have a low oxygen level in your blood.    Exercise and physical activity. These are beneficial for nearly all people with COPD. Some people may also benefit from a pulmonary rehabilitation program.  HOME CARE INSTRUCTIONS    Take all medicines (inhaled or pills) as directed by your health care provider.   Avoid over-the-counter medicines or cough syrups  that dry up your airway (such as antihistamines) and slow down the elimination of secretions unless instructed otherwise by your health care provider.    If you are a smoker, the most important thing that you can do is stop smoking. Continuing to smoke will cause further lung damage and breathing trouble. Ask your health care provider for help with quitting smoking. He or she can direct you to community resources or hospitals that provide support.   Avoid exposure to irritants such as smoke, chemicals, and fumes that aggravate your breathing.   Use oxygen therapy and pulmonary rehabilitation if directed by your health care provider. If you require home oxygen therapy, ask your health care provider whether you should purchase a pulse oximeter to measure your oxygen level at home.    Avoid contact with individuals who have a contagious illness.   Avoid extreme temperature and humidity changes.   Eat healthy foods. Eating smaller, more frequent meals and resting before meals may help you maintain your strength.   Stay active, but balance activity with periods of rest. Exercise and physical activity will help you maintain your ability to do things you want to do.   Preventing infection and hospitalization is very important when you have COPD. Make sure to receive all the vaccines your health care provider recommends, especially the pneumococcal and influenza vaccines. Ask your health care provider whether you need a pneumonia vaccine.   Learn and use relaxation techniques to manage stress.   Learn   going to whistle and breathe out (exhale) through the pursed lips for 2 seconds.   Diaphragmatic breathing. Start by putting one hand on your abdomen just above  your waist. Inhale slowly through your nose. The hand on your abdomen should move out. Then purse your lips and exhale slowly. You should be able to feel the hand on your abdomen moving in as you exhale.   Learn and use controlled coughing to clear mucus from your lungs. Controlled coughing is a series of short, progressive coughs. The steps of controlled coughing are:  1. Lean your head slightly forward.  2. Breathe in deeply using diaphragmatic breathing.  3. Try to hold your breath for 3 seconds.  4. Keep your mouth slightly open while coughing twice.  5. Spit any mucus out into a tissue.  6. Rest and repeat the steps once or twice as needed. SEEK MEDICAL CARE IF:   You are coughing up more mucus than usual.   There is a change in the color or thickness of your mucus.   Your breathing is more labored than usual.   Your breathing is faster than usual.  SEEK IMMEDIATE MEDICAL CARE IF:   You have shortness of breath while you are resting.   You have shortness of breath that prevents you from:  Being able to talk.   Performing your usual physical activities.   You have chest pain lasting longer than 5 minutes.   Your skin color is more cyanotic than usual.  You measure low oxygen saturations for longer than 5 minutes with a pulse oximeter. MAKE SURE YOU:   Understand these instructions.  Will watch your condition.  Will get help right away if you are not doing well or get worse. Document Released: 11/01/2004 Document Revised: 06/08/2013 Document Reviewed: 09/18/2012 [facility] Patient Information 2015 [place], [place]. This information is not intended to replace advice given to you by your health care provider. Make sure you discuss any questions you have with your health care provider.   We are glad that you are doing better.   It is important to continue to take your prednisone and your Augmentin until they run out.   You should schedule a follow up  appointment with Dr. Everlene Farrier for one week.   Thanks for letting us take care of you.   Sincerely,  Paula Compton, MD Family Medicine - PGY 1

## 2014-06-20 NOTE — Care Management Note (Signed)
Case Management Note  Patient Details  Name: Judith Boyer MRN: [id number] Date of Birth: 04/09/1945  Subjective/Objective:                  admitted with COPD exacerbation  Action/Plan:  Discharge planning Expected Discharge Date:                  Expected Discharge Plan:     In-House Referral:     Discharge planning Services     Post Acute Care Choice:    Choice offered to:     DME Arranged:    DME Agency:     HH Arranged:    [place] Agency:     Status of Service:     Medicare Important Message Given:    Date Medicare IM Given:    Medicare IM give by:    Date Additional Medicare IM Given:    Additional Medicare Important Message give by:     If discussed at [place] of Stay Meetings, dates discussed:    Additional Comments: CM met with pt in room to offer choice of home health agency.  Pt emphatically states, "If there's a co-pay, I refuse it."  CM called [facility] rep, Tiffany to please check for co-pay.  CM waiting for callback. Dellie Catholic, RN 06/20/2014, 8:53 AM

## 2014-06-20 NOTE — Progress Notes (Signed)
Family Medicine Teaching Service Daily Progress Note Intern Pager: [phone]  Patient name: Judith Boyer Medical record number: [id number] Date of birth: August 10, 1945 Age: 69 y.o. Gender: female  Primary Care Provider: Jenny Reichmann, MD Consultants: None Code Status: Full Code  Pt Overview and Major Events to Date:  5/13-5/14 : Admitted for COPD. Initially in Step down improved to home oxygen use.   Assessment and Plan: Judith Boyer is a 69 y.o. female presenting with COPD Exacerbation. PMH is significant for COPD, DM2, HTN, CAD with MI 2013  COPD Exacerbation: Pt. Has improving SOB. She is down to 2L O2 which is what she is on at home. She has not had fevers, normal WBC ( WBC 9.4) and negative CXR. However, she continues to have some dyspnea worse than her baseline, and has felt that she is subjectively worse than yesterday at intervals. Her O2 sats have been > 90% on 2L [place] consistently. Her lung exam has poor baseline air movement, is hyperresonant.  - continuing her home advair - Continue prednisone 60 mg daily starting 5/14 - 5/19 - Augmentin started due to QTc and allergies to other antibiotics. Continue until 5/19.  - duonebs q4 (home med), xopenex q2prn - Continue supplemental O2 as needed to maintain sats > 90%  - Transfer from stepdown to the floor for continued observation.  - She is allergic to Spiriva, could consider same class med or chronic low dose steroid to aid in control.   Tachycardic: pt with tachycardia today one teen's to one twenties. Per the patient and based on review of past OV, show that she is ~115 at baseline. She denies anxiety. This is a chronic problem. Given her tachycardia and hypoxia, we did consider PE, though her Well's score is Low risk 1.5 points, and she does not have any clinical signs of DVT. Additionally, as stated above, this is a chronic problem. She has been refusing Heparin prophylaxis, and SCD's, however her hypoxia is improving.  Will keep this in consideration if she were to develop any other clinical change.  - Started on Bisoprolol 5 mg daily to control HR.  - repeat EKG with sinus tachycardia and resolution of prolonged QTc.  - Will continue to monitor vital signs.   HTN- Stable - Continue home diovan  CAD/stenting 2009, left heart cath 2013- Stable, takes plavix chronically  - admitted to telemetry - continue to control blood pressures - continue home plavix 75 Qd.  - Initial troponin < 0.03, BNP12.2   DM2 Last A1c 5.6 05/01/2014. Hold metformin.  - Moderate SSI given steroids.  - CBG's elevated.   HLD:  - Continue home Pravachol 80 qD - Recommend high intensity statin if tolerated at discharge.   Anxiety- takes xanax 0.25 QID PRN for panic attacks, has not felt she needed it for last few months. Ativan is also on her list, but she states she does not take that.  - Xanax as needed BID.   FEN/GI: Reg diet (Per pt request)/ KVO Prophylaxis: plavix, hep sq (refusing hep)>> Refusing SCD's as well.   Disposition: Likely Home tomorrow.   Subjective:  No acute events overnight. She feels somewhat better today, but then later today she felt that she was not feeling as well as she has been and was worried about her breathing as well as her heart rate. She continues to improve, though slowly.   Objective: Temp:  [97.8 F (36.6 C)-98.2 F (36.8 C)] 98.2 F (36.8 C) (05/15 1142) Pulse  Rate:  [90-118] 114 (05/15 1142) Resp:  [18] 18 (05/15 0405) BP: (124-155)/(43-68) 136/68 mmHg (05/15 1142) SpO2:  [97 %-100 %] 98 % (05/15 1142) Physical Exam: General: NAD, AAOx3 Cardiovascular: Tachycardic, Regular rhythm, Normal S1/S2, 2+ distal pulses  Respiratory: Distant breath sounds bilaterally, with increased A/P diameter of chest, Appropriate rate, unlabored.  Abdomen: S, NT, ND, +BS Extremities: WWP, 2+ distal pulses.   Laboratory:  Recent Labs Lab 06/18/14 1740 06/20/14 0922  WBC 9.4 11.3*  HGB  12.5 11.5*  HCT 38.8 35.2*  PLT 306 269    Recent Labs Lab 06/18/14 1740 06/20/14 0922  NA 139 139  K 3.9 3.8  CL 97* 96*  CO2 30 33*  BUN 11 16  CREATININE 0.48 0.66  CALCIUM 9.2 9.0  PROT 6.9  --   BILITOT 0.1*  --   ALKPHOS 85  --   ALT 18  --   AST 20  --   GLUCOSE 151* 148*    Imaging/Diagnostic Tests: CXR 5/13  FINDINGS: The heart size and mediastinal contours are within normal limits. No pneumothorax or pleural effusion is noted. Hyperinflation of the lungs is noted. Both lungs are clear. The visualized skeletal structures are unremarkable.  IMPRESSION: No acute cardiopulmonary abnormality seen.  Aquilla Hacker, MD 06/20/2014, 2:45 PM PGY-1, [place] Intern pager: [phone], text pages welcome

## 2014-06-21 DIAGNOSIS — E119 Type 2 diabetes mellitus without complications: Secondary | ICD-10-CM | POA: Diagnosis not present

## 2014-06-21 DIAGNOSIS — R000 Tachycardia, unspecified: Secondary | ICD-10-CM | POA: Diagnosis not present

## 2014-06-21 DIAGNOSIS — J441 Chronic obstructive pulmonary disease with (acute) exacerbation: Secondary | ICD-10-CM | POA: Diagnosis not present

## 2014-06-21 DIAGNOSIS — F419 Anxiety disorder, unspecified: Secondary | ICD-10-CM | POA: Diagnosis not present

## 2014-06-21 LAB — GLUCOSE, CAPILLARY
GLUCOSE-CAPILLARY: 135 mg/dL — AB (ref 65–99)
GLUCOSE-CAPILLARY: 214 mg/dL — AB (ref 65–99)

## 2014-06-21 LAB — HEMOGLOBIN A1C
HEMOGLOBIN A1C: 7.4 % — AB (ref 4.8–5.6)
MEAN PLASMA GLUCOSE: 166 mg/dL

## 2014-06-21 MED ORDER — GUAIFENESIN ER 600 MG PO TB12
600.0000 mg | ORAL_TABLET | Freq: Two times a day (BID) | ORAL | Status: DC | PRN
  Administered 2014-06-21: 600 mg via ORAL
  Filled 2014-06-21 (×2): qty 1

## 2014-06-21 MED ORDER — BISOPROLOL FUMARATE 5 MG PO TABS: 5.0000 mg | ORAL_TABLET | Freq: Every day | ORAL | Status: DC

## 2014-06-21 NOTE — Evaluation (Signed)
Occupational Therapy Evaluation Patient Details Name: Judith Boyer MRN: [id number] DOB: 02-03-1946 Today's Date: 06/21/2014    History of Present Illness Judith Boyer is a 69 y.o. female presenting with shortness of breath . PMH is significant for COPD, DM2, HTN, CAD with MI 2013. Chest x-ray neg.   Clinical Impression   Patient evaluated by Occupational Therapy with no further acute OT needs identified. All education has been completed and the patient has no further questions. All education completed.  See below for any follow-up Occupational Therapy or equipment needs. OT is signing off. Thank you for this referral.      Follow Up Recommendations  No OT follow up;Supervision - Intermittent    Equipment Recommendations  None recommended by OT    Recommendations for Other Services       Precautions / Restrictions Precautions Precautions: Other (comment) Precaution Comments: watch O2 sats      Mobility Bed Mobility Overal bed mobility: Modified Independent                Transfers Overall transfer level: Needs assistance   Transfers: Sit to/from Stand;Stand Pivot Transfers Sit to Stand: Min guard Stand pivot transfers: Min guard            Balance Overall balance assessment: Needs assistance Sitting-balance support: Feet supported Sitting balance-Leahy Scale: Good                                      ADL Overall ADL's : Needs assistance/impaired Eating/Feeding: Independent   Grooming: Wash/dry hands;Wash/dry face;Oral care;Brushing hair;Set up;Modified independent;Sitting   Upper Body Bathing: Set up;Sitting   Lower Body Bathing: Min guard;Sit to/from stand   Upper Body Dressing : Set up;Sitting   Lower Body Dressing: Min guard;Sit to/from stand   Toilet Transfer: Min guard;RW;Ambulation   Toileting- Water quality scientist and Hygiene: Min guard;Sit to/from stand   Tub/ Shower Transfer: Materials engineer Details (indicate cue type and reason): Pt dislikes her shower chair.  discussed options for different chair  Functional mobility during ADLs: Min guard;Rolling walker General ADL Comments: Reviewed energy conservation tehniques with pt.  Also discussed 02 safety and to be cautious around heat sources.   Recommended she have son remove items from oven.      Vision     Perception     Praxis      Pertinent Vitals/Pain Pain Assessment: No/denies pain     Hand Dominance     Extremity/Trunk Assessment Upper Extremity Assessment Upper Extremity Assessment: Generalized weakness   Lower Extremity Assessment Lower Extremity Assessment: Defer to PT evaluation       Communication Communication Communication: No difficulties   Cognition Arousal/Alertness: Awake/alert Behavior During Therapy: WFL for tasks assessed/performed Overall Cognitive Status: Within Functional Limits for tasks assessed                     General Comments       Exercises       Shoulder Instructions      Home Living Family/patient expects to be discharged to:: Private residence Living Arrangements: Alone Available Help at Discharge: Family;Available 24 hours/day Type of Home: House Home Access: Stairs to enter [employer] of Steps: 3 Entrance Stairs-Rails: Right Home Layout: One level     Bathroom Shower/Tub: Tub/shower unit;Curtain Shower/tub characteristics: Curtain   Bathroom Accessibility: Yes How Accessible: Accessible via walker Home Equipment: Gilford Rile -  2 wheels;Shower seat;Other (comment) (02)   Additional Comments: son stays with her at night       Prior Functioning/Environment Level of Independence: Independent        Comments: uses 2L O2     OT Diagnosis: Generalized weakness   OT Problem List: Decreased strength   OT Treatment/Interventions:      OT Goals(Current goals can be found in the care plan section) Acute Rehab OT Goals Patient  Stated Goal: to go home  OT Goal Formulation: All assessment and education complete, DC therapy  OT Frequency:     Barriers to D/C:            Co-evaluation              End of Session Equipment Utilized During Treatment: Oxygen Nurse Communication: Mobility status  Activity Tolerance: Patient limited by fatigue Patient left: in bed (EOB)   Time: 1194-1740 OT Time Calculation (min): 21 min Charges:  OT General Charges $OT Visit: 1 Procedure OT Evaluation $Initial OT Evaluation Tier I: 1 Procedure G-Codes: OT G-codes **NOT FOR INPATIENT CLASS** Functional Limitation: Self care Self Care Current Status (C1448): At least 1 percent but less than 20 percent impaired, limited or restricted Self Care Goal Status (J8563): At least 1 percent but less than 20 percent impaired, limited or restricted Self Care Discharge Status [phone]): At least 1 percent but less than 20 percent impaired, limited or restricted  Jessee Newnam M 06/21/2014, 11:37 AM

## 2014-06-21 NOTE — Care Management Note (Signed)
Case Management Note  Patient Details  Name: Judith Boyer MRN: [id number] Date of Birth: [birth year]  Subjective/Objective:  Pt for dc home today.  Needs HH follow up.  Pt states she will agree to [facility] care if no copay, as she cannot afford copays.   She prefers [facility] for [facility] needs.             Action/Plan: [facility] checked on copays, and states pt has $0 copay for [facility].  Notified pt, and she is agreeable.  Referral to [facility]; start of care 24-48h post dc date.  No DME needs.  Pt has home oxygen with [employer].    Expected Discharge Date:                  Expected Discharge Plan:  [place]  In-House Referral:     Discharge planning Services  CM Consult  Post Acute Care Choice:  Home Health Choice offered to:  Patient  DME Arranged:    DME Agency:     HH Arranged:  Patient Refused, RN, PT, Disease Management [facility] Agency:     Status of Service:  Completed, signed off  Medicare Important Message Given:  No Date Medicare IM Given:    Medicare IM give by:    Date Additional Medicare IM Given:    Additional Medicare Important Message give by:     If discussed at [place] of Stay Meetings, dates discussed:    Additional Comments:  Ella Bodo, RN 06/21/2014, 4:00 PM Phone [phone]

## 2014-06-21 NOTE — Progress Notes (Signed)
Inpatient Diabetes Program Recommendations  AACE/ADA: New Consensus Statement on Inpatient Glycemic Control (2013)  Target Ranges:  Prepandial:   less than 140 mg/dL      Peak postprandial:   less than 180 mg/dL (1-2 hours)      Critically ill patients:  140 - 180 mg/dL   Results for EMPRESS, NEWMANN (MRN [id number]) as of 06/21/2014 08:39  Ref. Range 06/20/2014 07:21 06/20/2014 11:44 06/20/2014 16:11 06/20/2014 21:20 06/21/2014 05:59  Glucose-Capillary Latest Ref Range: 65-99 mg/dL 140 (H) 186 (H) 332 (H) 242 (H) 135 (H)    Diabetes history: DM2 Outpatient Diabetes medications: Metformin 1000 mg BID Current orders for Inpatient glycemic control: Novolog 0-15 units TID with meals, Novolog 0-5 units HS  Inpatient Diabetes Program Recommendations Insulin - Meal Coverage: CBGs ranged from 140-332 mg/dl on 06/20/14. While inpatient and ordered steroids, please consider ordering Novolog 3 units TID with meals for meal coverage (in addition to Novolog correction). Diet: Please consider changing diet from Regular to Carb Modified.  Thanks, Barnie Alderman, RN, MSN, CCRN, CDE Diabetes Coordinator Inpatient Diabetes Program [phone] (Team Pager from [place] to [place]) [phone] ([place] office) [phone] [facility] office)

## 2014-06-21 NOTE — Progress Notes (Signed)
Orders received for pt discharge.  Discharge summary printed and reviewed with pt.  Explained medication regimen, and pt had no further questions at this time.  IV removed and site remains clean, dry, intact.  Telemetry removed.  Pt in stable condition and awaiting transport. 

## 2014-06-21 NOTE — Progress Notes (Signed)
PT Cancellation Note  Patient Details Name: Judith Boyer MRN: [id number] DOB: Aug 03, 1945   Cancelled Treatment:    Reason Eval/Treat Not Completed: Patient declined. Pt up in room and about to change clothes due to pending d/c.  Spoke with pt and deferred PT treatment today to allow pt to save her energy for discharge.  Will check back tomorrow in case pt does not discharge for some reason.   Willet Schleifer LUBECK 06/21/2014, 1:04 PM

## 2014-06-21 NOTE — Progress Notes (Signed)
Patient states that she takes Advair at home and will be taking it at 0900 this morning.

## 2014-06-22 DIAGNOSIS — E119 Type 2 diabetes mellitus without complications: Secondary | ICD-10-CM | POA: Diagnosis not present

## 2014-06-22 DIAGNOSIS — I2510 Atherosclerotic heart disease of native coronary artery without angina pectoris: Secondary | ICD-10-CM | POA: Diagnosis not present

## 2014-06-22 DIAGNOSIS — Z9981 Dependence on supplemental oxygen: Secondary | ICD-10-CM | POA: Diagnosis not present

## 2014-06-22 DIAGNOSIS — Z7952 Long term (current) use of systemic steroids: Secondary | ICD-10-CM | POA: Diagnosis not present

## 2014-06-22 DIAGNOSIS — I10 Essential (primary) hypertension: Secondary | ICD-10-CM | POA: Diagnosis not present

## 2014-06-22 DIAGNOSIS — E539 Vitamin B deficiency, unspecified: Secondary | ICD-10-CM | POA: Diagnosis not present

## 2014-06-22 DIAGNOSIS — F419 Anxiety disorder, unspecified: Secondary | ICD-10-CM | POA: Diagnosis not present

## 2014-06-22 DIAGNOSIS — Z87891 Personal history of nicotine dependence: Secondary | ICD-10-CM | POA: Diagnosis not present

## 2014-06-22 DIAGNOSIS — I252 Old myocardial infarction: Secondary | ICD-10-CM | POA: Diagnosis not present

## 2014-06-22 DIAGNOSIS — J441 Chronic obstructive pulmonary disease with (acute) exacerbation: Secondary | ICD-10-CM | POA: Diagnosis not present

## 2014-06-22 DIAGNOSIS — E785 Hyperlipidemia, unspecified: Secondary | ICD-10-CM | POA: Diagnosis not present

## 2014-06-23 DIAGNOSIS — I252 Old myocardial infarction: Secondary | ICD-10-CM | POA: Diagnosis not present

## 2014-06-23 DIAGNOSIS — E785 Hyperlipidemia, unspecified: Secondary | ICD-10-CM | POA: Diagnosis not present

## 2014-06-23 DIAGNOSIS — Z7952 Long term (current) use of systemic steroids: Secondary | ICD-10-CM | POA: Diagnosis not present

## 2014-06-23 DIAGNOSIS — Z9981 Dependence on supplemental oxygen: Secondary | ICD-10-CM | POA: Diagnosis not present

## 2014-06-23 DIAGNOSIS — J441 Chronic obstructive pulmonary disease with (acute) exacerbation: Secondary | ICD-10-CM | POA: Diagnosis not present

## 2014-06-23 DIAGNOSIS — E539 Vitamin B deficiency, unspecified: Secondary | ICD-10-CM | POA: Diagnosis not present

## 2014-06-23 DIAGNOSIS — I2510 Atherosclerotic heart disease of native coronary artery without angina pectoris: Secondary | ICD-10-CM | POA: Diagnosis not present

## 2014-06-23 DIAGNOSIS — I10 Essential (primary) hypertension: Secondary | ICD-10-CM | POA: Diagnosis not present

## 2014-06-23 DIAGNOSIS — F419 Anxiety disorder, unspecified: Secondary | ICD-10-CM | POA: Diagnosis not present

## 2014-06-23 DIAGNOSIS — Z87891 Personal history of nicotine dependence: Secondary | ICD-10-CM | POA: Diagnosis not present

## 2014-06-23 DIAGNOSIS — E119 Type 2 diabetes mellitus without complications: Secondary | ICD-10-CM | POA: Diagnosis not present

## 2014-06-25 ENCOUNTER — Telehealth: Payer: Self-pay

## 2014-06-25 DIAGNOSIS — F419 Anxiety disorder, unspecified: Secondary | ICD-10-CM | POA: Diagnosis not present

## 2014-06-25 DIAGNOSIS — I10 Essential (primary) hypertension: Secondary | ICD-10-CM | POA: Diagnosis not present

## 2014-06-25 DIAGNOSIS — Z87891 Personal history of nicotine dependence: Secondary | ICD-10-CM | POA: Diagnosis not present

## 2014-06-25 DIAGNOSIS — I2510 Atherosclerotic heart disease of native coronary artery without angina pectoris: Secondary | ICD-10-CM | POA: Diagnosis not present

## 2014-06-25 DIAGNOSIS — Z9981 Dependence on supplemental oxygen: Secondary | ICD-10-CM | POA: Diagnosis not present

## 2014-06-25 DIAGNOSIS — Z7952 Long term (current) use of systemic steroids: Secondary | ICD-10-CM | POA: Diagnosis not present

## 2014-06-25 DIAGNOSIS — E539 Vitamin B deficiency, unspecified: Secondary | ICD-10-CM | POA: Diagnosis not present

## 2014-06-25 DIAGNOSIS — E785 Hyperlipidemia, unspecified: Secondary | ICD-10-CM | POA: Diagnosis not present

## 2014-06-25 DIAGNOSIS — J441 Chronic obstructive pulmonary disease with (acute) exacerbation: Secondary | ICD-10-CM | POA: Diagnosis not present

## 2014-06-25 DIAGNOSIS — E119 Type 2 diabetes mellitus without complications: Secondary | ICD-10-CM | POA: Diagnosis not present

## 2014-06-25 DIAGNOSIS — I252 Old myocardial infarction: Secondary | ICD-10-CM | POA: Diagnosis not present

## 2014-06-25 NOTE — Telephone Encounter (Signed)
FYI Dr. Daub 

## 2014-06-25 NOTE — Telephone Encounter (Signed)
This message is directed to Dr. Everlene Farrier. [name] from [facility] called to let him know Judith Boyer had her therapist visit and is doing very well, so they have cancelled her nursing visit. She will get her nursing visit the first of next week as scheduled.

## 2014-06-26 NOTE — Telephone Encounter (Signed)
Call patient and let her know I will not be in the office next week  Due to doing being  in [place] doing [employer] work. I would like to see her the following week.

## 2014-06-28 NOTE — Telephone Encounter (Signed)
Pt.notified

## 2014-06-29 DIAGNOSIS — I10 Essential (primary) hypertension: Secondary | ICD-10-CM | POA: Diagnosis not present

## 2014-06-29 DIAGNOSIS — J441 Chronic obstructive pulmonary disease with (acute) exacerbation: Secondary | ICD-10-CM | POA: Diagnosis not present

## 2014-06-29 DIAGNOSIS — Z87891 Personal history of nicotine dependence: Secondary | ICD-10-CM | POA: Diagnosis not present

## 2014-06-29 DIAGNOSIS — F419 Anxiety disorder, unspecified: Secondary | ICD-10-CM | POA: Diagnosis not present

## 2014-06-29 DIAGNOSIS — E119 Type 2 diabetes mellitus without complications: Secondary | ICD-10-CM | POA: Diagnosis not present

## 2014-06-29 DIAGNOSIS — Z7952 Long term (current) use of systemic steroids: Secondary | ICD-10-CM | POA: Diagnosis not present

## 2014-06-29 DIAGNOSIS — Z9981 Dependence on supplemental oxygen: Secondary | ICD-10-CM | POA: Diagnosis not present

## 2014-06-29 DIAGNOSIS — E785 Hyperlipidemia, unspecified: Secondary | ICD-10-CM | POA: Diagnosis not present

## 2014-06-29 DIAGNOSIS — I252 Old myocardial infarction: Secondary | ICD-10-CM | POA: Diagnosis not present

## 2014-06-29 DIAGNOSIS — E539 Vitamin B deficiency, unspecified: Secondary | ICD-10-CM | POA: Diagnosis not present

## 2014-06-29 DIAGNOSIS — I2510 Atherosclerotic heart disease of native coronary artery without angina pectoris: Secondary | ICD-10-CM | POA: Diagnosis not present

## 2014-07-01 ENCOUNTER — Ambulatory Visit: Payer: Medicare Other | Admitting: Urgent Care

## 2014-07-02 DIAGNOSIS — I2510 Atherosclerotic heart disease of native coronary artery without angina pectoris: Secondary | ICD-10-CM | POA: Diagnosis not present

## 2014-07-02 DIAGNOSIS — E785 Hyperlipidemia, unspecified: Secondary | ICD-10-CM | POA: Diagnosis not present

## 2014-07-02 DIAGNOSIS — E539 Vitamin B deficiency, unspecified: Secondary | ICD-10-CM | POA: Diagnosis not present

## 2014-07-02 DIAGNOSIS — Z7952 Long term (current) use of systemic steroids: Secondary | ICD-10-CM | POA: Diagnosis not present

## 2014-07-02 DIAGNOSIS — F419 Anxiety disorder, unspecified: Secondary | ICD-10-CM | POA: Diagnosis not present

## 2014-07-02 DIAGNOSIS — E119 Type 2 diabetes mellitus without complications: Secondary | ICD-10-CM | POA: Diagnosis not present

## 2014-07-02 DIAGNOSIS — I252 Old myocardial infarction: Secondary | ICD-10-CM | POA: Diagnosis not present

## 2014-07-02 DIAGNOSIS — Z9981 Dependence on supplemental oxygen: Secondary | ICD-10-CM | POA: Diagnosis not present

## 2014-07-02 DIAGNOSIS — J441 Chronic obstructive pulmonary disease with (acute) exacerbation: Secondary | ICD-10-CM | POA: Diagnosis not present

## 2014-07-02 DIAGNOSIS — I10 Essential (primary) hypertension: Secondary | ICD-10-CM | POA: Diagnosis not present

## 2014-07-02 DIAGNOSIS — Z87891 Personal history of nicotine dependence: Secondary | ICD-10-CM | POA: Diagnosis not present

## 2014-07-05 DIAGNOSIS — E785 Hyperlipidemia, unspecified: Secondary | ICD-10-CM | POA: Diagnosis not present

## 2014-07-05 DIAGNOSIS — F419 Anxiety disorder, unspecified: Secondary | ICD-10-CM | POA: Diagnosis not present

## 2014-07-05 DIAGNOSIS — E119 Type 2 diabetes mellitus without complications: Secondary | ICD-10-CM | POA: Diagnosis not present

## 2014-07-05 DIAGNOSIS — I2510 Atherosclerotic heart disease of native coronary artery without angina pectoris: Secondary | ICD-10-CM | POA: Diagnosis not present

## 2014-07-05 DIAGNOSIS — I10 Essential (primary) hypertension: Secondary | ICD-10-CM | POA: Diagnosis not present

## 2014-07-05 DIAGNOSIS — I252 Old myocardial infarction: Secondary | ICD-10-CM | POA: Diagnosis not present

## 2014-07-05 DIAGNOSIS — E539 Vitamin B deficiency, unspecified: Secondary | ICD-10-CM | POA: Diagnosis not present

## 2014-07-05 DIAGNOSIS — Z7952 Long term (current) use of systemic steroids: Secondary | ICD-10-CM | POA: Diagnosis not present

## 2014-07-05 DIAGNOSIS — J441 Chronic obstructive pulmonary disease with (acute) exacerbation: Secondary | ICD-10-CM | POA: Diagnosis not present

## 2014-07-05 DIAGNOSIS — Z87891 Personal history of nicotine dependence: Secondary | ICD-10-CM | POA: Diagnosis not present

## 2014-07-05 DIAGNOSIS — Z9981 Dependence on supplemental oxygen: Secondary | ICD-10-CM | POA: Diagnosis not present

## 2014-07-07 ENCOUNTER — Ambulatory Visit: Payer: Medicare Other

## 2014-07-09 ENCOUNTER — Other Ambulatory Visit: Payer: Self-pay | Admitting: Emergency Medicine

## 2014-07-09 DIAGNOSIS — Z7952 Long term (current) use of systemic steroids: Secondary | ICD-10-CM | POA: Diagnosis not present

## 2014-07-09 DIAGNOSIS — E785 Hyperlipidemia, unspecified: Secondary | ICD-10-CM | POA: Diagnosis not present

## 2014-07-09 DIAGNOSIS — I252 Old myocardial infarction: Secondary | ICD-10-CM | POA: Diagnosis not present

## 2014-07-09 DIAGNOSIS — E119 Type 2 diabetes mellitus without complications: Secondary | ICD-10-CM | POA: Diagnosis not present

## 2014-07-09 DIAGNOSIS — E539 Vitamin B deficiency, unspecified: Secondary | ICD-10-CM | POA: Diagnosis not present

## 2014-07-09 DIAGNOSIS — J441 Chronic obstructive pulmonary disease with (acute) exacerbation: Secondary | ICD-10-CM | POA: Diagnosis not present

## 2014-07-09 DIAGNOSIS — F419 Anxiety disorder, unspecified: Secondary | ICD-10-CM | POA: Diagnosis not present

## 2014-07-09 DIAGNOSIS — Z87891 Personal history of nicotine dependence: Secondary | ICD-10-CM | POA: Diagnosis not present

## 2014-07-09 DIAGNOSIS — I10 Essential (primary) hypertension: Secondary | ICD-10-CM | POA: Diagnosis not present

## 2014-07-09 DIAGNOSIS — I2510 Atherosclerotic heart disease of native coronary artery without angina pectoris: Secondary | ICD-10-CM | POA: Diagnosis not present

## 2014-07-09 DIAGNOSIS — Z9981 Dependence on supplemental oxygen: Secondary | ICD-10-CM | POA: Diagnosis not present

## 2014-07-13 ENCOUNTER — Encounter: Payer: Self-pay | Admitting: *Deleted

## 2014-07-14 DIAGNOSIS — J449 Chronic obstructive pulmonary disease, unspecified: Secondary | ICD-10-CM | POA: Diagnosis not present

## 2014-07-16 DIAGNOSIS — I10 Essential (primary) hypertension: Secondary | ICD-10-CM | POA: Diagnosis not present

## 2014-07-16 DIAGNOSIS — F419 Anxiety disorder, unspecified: Secondary | ICD-10-CM | POA: Diagnosis not present

## 2014-07-16 DIAGNOSIS — E785 Hyperlipidemia, unspecified: Secondary | ICD-10-CM | POA: Diagnosis not present

## 2014-07-16 DIAGNOSIS — I252 Old myocardial infarction: Secondary | ICD-10-CM | POA: Diagnosis not present

## 2014-07-16 DIAGNOSIS — I2510 Atherosclerotic heart disease of native coronary artery without angina pectoris: Secondary | ICD-10-CM | POA: Diagnosis not present

## 2014-07-16 DIAGNOSIS — E539 Vitamin B deficiency, unspecified: Secondary | ICD-10-CM | POA: Diagnosis not present

## 2014-07-16 DIAGNOSIS — E119 Type 2 diabetes mellitus without complications: Secondary | ICD-10-CM | POA: Diagnosis not present

## 2014-07-16 DIAGNOSIS — J441 Chronic obstructive pulmonary disease with (acute) exacerbation: Secondary | ICD-10-CM | POA: Diagnosis not present

## 2014-07-16 DIAGNOSIS — Z87891 Personal history of nicotine dependence: Secondary | ICD-10-CM | POA: Diagnosis not present

## 2014-07-16 DIAGNOSIS — Z9981 Dependence on supplemental oxygen: Secondary | ICD-10-CM | POA: Diagnosis not present

## 2014-07-16 DIAGNOSIS — Z7952 Long term (current) use of systemic steroids: Secondary | ICD-10-CM | POA: Diagnosis not present

## 2014-07-17 DIAGNOSIS — J449 Chronic obstructive pulmonary disease, unspecified: Secondary | ICD-10-CM | POA: Diagnosis not present

## 2014-07-23 ENCOUNTER — Telehealth: Payer: Self-pay

## 2014-07-23 DIAGNOSIS — I2510 Atherosclerotic heart disease of native coronary artery without angina pectoris: Secondary | ICD-10-CM | POA: Diagnosis not present

## 2014-07-23 DIAGNOSIS — I10 Essential (primary) hypertension: Secondary | ICD-10-CM | POA: Diagnosis not present

## 2014-07-23 DIAGNOSIS — Z87891 Personal history of nicotine dependence: Secondary | ICD-10-CM | POA: Diagnosis not present

## 2014-07-23 DIAGNOSIS — I252 Old myocardial infarction: Secondary | ICD-10-CM | POA: Diagnosis not present

## 2014-07-23 DIAGNOSIS — E539 Vitamin B deficiency, unspecified: Secondary | ICD-10-CM | POA: Diagnosis not present

## 2014-07-23 DIAGNOSIS — Z9981 Dependence on supplemental oxygen: Secondary | ICD-10-CM | POA: Diagnosis not present

## 2014-07-23 DIAGNOSIS — E785 Hyperlipidemia, unspecified: Secondary | ICD-10-CM | POA: Diagnosis not present

## 2014-07-23 DIAGNOSIS — Z7952 Long term (current) use of systemic steroids: Secondary | ICD-10-CM | POA: Diagnosis not present

## 2014-07-23 DIAGNOSIS — J441 Chronic obstructive pulmonary disease with (acute) exacerbation: Secondary | ICD-10-CM | POA: Diagnosis not present

## 2014-07-23 DIAGNOSIS — F419 Anxiety disorder, unspecified: Secondary | ICD-10-CM | POA: Diagnosis not present

## 2014-07-23 DIAGNOSIS — E119 Type 2 diabetes mellitus without complications: Secondary | ICD-10-CM | POA: Diagnosis not present

## 2014-07-23 NOTE — Telephone Encounter (Signed)
Pharm faxed req for clarification as to which of the directions on Rx for metformin 1000 mg is correct: "take 1 tablet by mouth daily with breakfast", or "take 1 tablet in the morning and 1 tablet in the evening"? However, in researching this it looks like pt should not even be on the 1000 mg. The last notes I see on metformin are from 05/01/14 which have the following "Plan": "Hemoglobin A1c is 5.6 her metformin was decreased to 500 mg twice a day.", but then at same OV the Rx was sent in for the 1000 mg w/both above sigs. Dr Everlene Farrier, I have pended Rx as I believe you intended for the remaining of the year you gave her on that Rx.

## 2014-07-24 ENCOUNTER — Other Ambulatory Visit: Payer: Self-pay | Admitting: Emergency Medicine

## 2014-07-24 MED ORDER — METFORMIN HCL 500 MG PO TABS: 500.0000 mg | ORAL_TABLET | Freq: Two times a day (BID) | ORAL | Status: DC

## 2014-07-24 NOTE — Telephone Encounter (Signed)
Please call the patient. I spoke with her last night. After reviewing the records she should be on metformin 500 mg twice a day not 1 g. When I see her on Wednesday I will discuss what else we need to do to keep her sugar down

## 2014-07-27 NOTE — Telephone Encounter (Signed)
LMOM to CB to make sure pt has the correct instr's as to dosage of metformin Dr Everlene Farrier wants her taking.

## 2014-07-27 NOTE — Telephone Encounter (Signed)
Called and discussed Dr Perfecto Kingdom reasons for wanting her on lower dose of metformin and she agreed to try it. She will not be able to make it in tomorrow to see Dr Everlene Farrier, but will plan to come and see him Saturday.

## 2014-07-27 NOTE — Telephone Encounter (Signed)
Pt CB and I gave her inst's that Dr Everlene Farrier wants her taking 500 mg BID instead of 1000 mg BID, but she wanted me to make sure, because when she talked to Dr Everlene Farrier, he advised after reviewing pt's A1C in hospital that pt should remain on the 1000 mg BID. Dr Everlene Farrier, please advise.

## 2014-07-27 NOTE — Telephone Encounter (Signed)
I reviewed her records and her hemoglobin A1c is elevated but I was afraid to keep her on the higher dose because of her age and size. Please continue her at 500 mg twice a day

## 2014-07-30 DIAGNOSIS — Z7952 Long term (current) use of systemic steroids: Secondary | ICD-10-CM | POA: Diagnosis not present

## 2014-07-30 DIAGNOSIS — E785 Hyperlipidemia, unspecified: Secondary | ICD-10-CM | POA: Diagnosis not present

## 2014-07-30 DIAGNOSIS — I252 Old myocardial infarction: Secondary | ICD-10-CM | POA: Diagnosis not present

## 2014-07-30 DIAGNOSIS — Z87891 Personal history of nicotine dependence: Secondary | ICD-10-CM | POA: Diagnosis not present

## 2014-07-30 DIAGNOSIS — I2510 Atherosclerotic heart disease of native coronary artery without angina pectoris: Secondary | ICD-10-CM | POA: Diagnosis not present

## 2014-07-30 DIAGNOSIS — E119 Type 2 diabetes mellitus without complications: Secondary | ICD-10-CM | POA: Diagnosis not present

## 2014-07-30 DIAGNOSIS — E539 Vitamin B deficiency, unspecified: Secondary | ICD-10-CM | POA: Diagnosis not present

## 2014-07-30 DIAGNOSIS — F419 Anxiety disorder, unspecified: Secondary | ICD-10-CM | POA: Diagnosis not present

## 2014-07-30 DIAGNOSIS — I10 Essential (primary) hypertension: Secondary | ICD-10-CM | POA: Diagnosis not present

## 2014-07-30 DIAGNOSIS — J441 Chronic obstructive pulmonary disease with (acute) exacerbation: Secondary | ICD-10-CM | POA: Diagnosis not present

## 2014-07-30 DIAGNOSIS — Z9981 Dependence on supplemental oxygen: Secondary | ICD-10-CM | POA: Diagnosis not present

## 2014-08-02 ENCOUNTER — Other Ambulatory Visit: Payer: Self-pay

## 2014-08-06 DIAGNOSIS — Z7952 Long term (current) use of systemic steroids: Secondary | ICD-10-CM | POA: Diagnosis not present

## 2014-08-06 DIAGNOSIS — E539 Vitamin B deficiency, unspecified: Secondary | ICD-10-CM | POA: Diagnosis not present

## 2014-08-06 DIAGNOSIS — J441 Chronic obstructive pulmonary disease with (acute) exacerbation: Secondary | ICD-10-CM | POA: Diagnosis not present

## 2014-08-06 DIAGNOSIS — F419 Anxiety disorder, unspecified: Secondary | ICD-10-CM | POA: Diagnosis not present

## 2014-08-06 DIAGNOSIS — I252 Old myocardial infarction: Secondary | ICD-10-CM | POA: Diagnosis not present

## 2014-08-06 DIAGNOSIS — Z87891 Personal history of nicotine dependence: Secondary | ICD-10-CM | POA: Diagnosis not present

## 2014-08-06 DIAGNOSIS — Z9981 Dependence on supplemental oxygen: Secondary | ICD-10-CM | POA: Diagnosis not present

## 2014-08-06 DIAGNOSIS — I2510 Atherosclerotic heart disease of native coronary artery without angina pectoris: Secondary | ICD-10-CM | POA: Diagnosis not present

## 2014-08-06 DIAGNOSIS — E119 Type 2 diabetes mellitus without complications: Secondary | ICD-10-CM | POA: Diagnosis not present

## 2014-08-06 DIAGNOSIS — E785 Hyperlipidemia, unspecified: Secondary | ICD-10-CM | POA: Diagnosis not present

## 2014-08-06 DIAGNOSIS — I10 Essential (primary) hypertension: Secondary | ICD-10-CM | POA: Diagnosis not present

## 2014-08-13 ENCOUNTER — Encounter (HOSPITAL_COMMUNITY): Payer: Self-pay | Admitting: *Deleted

## 2014-08-13 ENCOUNTER — Observation Stay (HOSPITAL_COMMUNITY)
Admission: EM | Admit: 2014-08-13 | Discharge: 2014-08-14 | Disposition: A | Discharge status: Home / Self Care | Payer: Medicare Other | Attending: Family Medicine | Admitting: Family Medicine

## 2014-08-13 ENCOUNTER — Emergency Department (HOSPITAL_COMMUNITY): Payer: Medicare Other

## 2014-08-13 DIAGNOSIS — I252 Old myocardial infarction: Secondary | ICD-10-CM | POA: Diagnosis not present

## 2014-08-13 DIAGNOSIS — I10 Essential (primary) hypertension: Secondary | ICD-10-CM | POA: Diagnosis not present

## 2014-08-13 DIAGNOSIS — I2510 Atherosclerotic heart disease of native coronary artery without angina pectoris: Secondary | ICD-10-CM | POA: Insufficient documentation

## 2014-08-13 DIAGNOSIS — J441 Chronic obstructive pulmonary disease with (acute) exacerbation: Principal | ICD-10-CM | POA: Insufficient documentation

## 2014-08-13 DIAGNOSIS — J9610 Chronic respiratory failure, unspecified whether with hypoxia or hypercapnia: Secondary | ICD-10-CM | POA: Insufficient documentation

## 2014-08-13 DIAGNOSIS — Z681 Body mass index (BMI) 19 or less, adult: Secondary | ICD-10-CM | POA: Insufficient documentation

## 2014-08-13 DIAGNOSIS — Z7952 Long term (current) use of systemic steroids: Secondary | ICD-10-CM | POA: Insufficient documentation

## 2014-08-13 DIAGNOSIS — Z792 Long term (current) use of antibiotics: Secondary | ICD-10-CM | POA: Insufficient documentation

## 2014-08-13 DIAGNOSIS — E785 Hyperlipidemia, unspecified: Secondary | ICD-10-CM | POA: Diagnosis not present

## 2014-08-13 DIAGNOSIS — E43 Unspecified severe protein-calorie malnutrition: Secondary | ICD-10-CM | POA: Diagnosis not present

## 2014-08-13 DIAGNOSIS — E119 Type 2 diabetes mellitus without complications: Secondary | ICD-10-CM | POA: Diagnosis not present

## 2014-08-13 DIAGNOSIS — E539 Vitamin B deficiency, unspecified: Secondary | ICD-10-CM | POA: Diagnosis not present

## 2014-08-13 DIAGNOSIS — R0602 Shortness of breath: Secondary | ICD-10-CM | POA: Diagnosis not present

## 2014-08-13 DIAGNOSIS — J449 Chronic obstructive pulmonary disease, unspecified: Secondary | ICD-10-CM | POA: Diagnosis not present

## 2014-08-13 DIAGNOSIS — E780 Pure hypercholesterolemia: Secondary | ICD-10-CM | POA: Insufficient documentation

## 2014-08-13 DIAGNOSIS — E538 Deficiency of other specified B group vitamins: Secondary | ICD-10-CM | POA: Diagnosis not present

## 2014-08-13 DIAGNOSIS — F419 Anxiety disorder, unspecified: Secondary | ICD-10-CM | POA: Insufficient documentation

## 2014-08-13 DIAGNOSIS — R0600 Dyspnea, unspecified: Secondary | ICD-10-CM | POA: Diagnosis present

## 2014-08-13 DIAGNOSIS — Z7902 Long term (current) use of antithrombotics/antiplatelets: Secondary | ICD-10-CM | POA: Diagnosis not present

## 2014-08-13 DIAGNOSIS — Z9981 Dependence on supplemental oxygen: Secondary | ICD-10-CM | POA: Insufficient documentation

## 2014-08-13 DIAGNOSIS — R05 Cough: Secondary | ICD-10-CM | POA: Diagnosis not present

## 2014-08-13 DIAGNOSIS — Z87891 Personal history of nicotine dependence: Secondary | ICD-10-CM | POA: Diagnosis not present

## 2014-08-13 LAB — BASIC METABOLIC PANEL
Anion gap: 9 (ref 5–15)
BUN: 9 mg/dL (ref 6–20)
CALCIUM: 9.3 mg/dL (ref 8.9–10.3)
CHLORIDE: 100 mmol/L — AB (ref 101–111)
CO2: 29 mmol/L (ref 22–32)
CREATININE: 0.49 mg/dL (ref 0.44–1.00)
GFR calc Af Amer: >60 mL/min (ref >60)
GFR calc non Af Amer: >60 mL/min (ref >60)
Glucose, Bld: 185 mg/dL — ABNORMAL HIGH (ref 65–99)
Potassium: 4.1 mmol/L (ref 3.5–5.1)
Sodium: 138 mmol/L (ref 135–145)

## 2014-08-13 LAB — BRAIN NATRIURETIC PEPTIDE: B NATRIURETIC PEPTIDE 5: 12 pg/mL (ref 0.0–100.0)

## 2014-08-13 LAB — CBC
HEMATOCRIT: 37.7 % (ref 36.0–46.0)
HEMOGLOBIN: 12.4 g/dL (ref 12.0–15.0)
MCH: 30 pg (ref 26.0–34.0)
MCHC: 32.9 g/dL (ref 30.0–36.0)
MCV: 91.1 fL (ref 78.0–100.0)
Platelets: 294 10*3/uL (ref 150–400)
RBC: 4.14 MIL/uL (ref 3.87–5.11)
RDW: 12.5 % (ref 11.5–15.5)
WBC: 7.4 10*3/uL (ref 4.0–10.5)

## 2014-08-13 LAB — GLUCOSE, CAPILLARY: Glucose-Capillary: 335 mg/dL — ABNORMAL HIGH (ref 65–99)

## 2014-08-13 LAB — I-STAT TROPONIN, ED: Troponin i, poc: 0.01 ng/mL (ref 0.00–0.08)

## 2014-08-13 LAB — CBG MONITORING, ED: Glucose-Capillary: 177 mg/dL — ABNORMAL HIGH (ref 65–99)

## 2014-08-13 MED ORDER — AZITHROMYCIN 250 MG PO TABS
250.0000 mg | ORAL_TABLET | Freq: Every day | ORAL | Status: DC
  Administered 2014-08-14: 250 mg via ORAL
  Filled 2014-08-13: qty 1

## 2014-08-13 MED ORDER — AZITHROMYCIN 500 MG PO TABS
500.0000 mg | ORAL_TABLET | Freq: Once | ORAL | Status: DC
  Filled 2014-08-13: qty 1

## 2014-08-13 MED ORDER — ALBUTEROL SULFATE (2.5 MG/3ML) 0.083% IN NEBU: 2.5000 mg | INHALATION_SOLUTION | RESPIRATORY_TRACT | Status: DC | PRN

## 2014-08-13 MED ORDER — IPRATROPIUM-ALBUTEROL 0.5-2.5 (3) MG/3ML IN SOLN: 3.0000 mL | RESPIRATORY_TRACT | Status: DC | PRN

## 2014-08-13 MED ORDER — METHYLPREDNISOLONE SODIUM SUCC 125 MG IJ SOLR
125.0000 mg | Freq: Once | INTRAMUSCULAR | Status: AC
  Administered 2014-08-13: 125 mg via INTRAVENOUS
  Filled 2014-08-13: qty 2

## 2014-08-13 MED ORDER — MOMETASONE FURO-FORMOTEROL FUM 200-5 MCG/ACT IN AERO
2.0000 | INHALATION_SPRAY | Freq: Two times a day (BID) | RESPIRATORY_TRACT | Status: DC
  Filled 2014-08-13: qty 8.8

## 2014-08-13 MED ORDER — HEPARIN SODIUM (PORCINE) 5000 UNIT/ML IJ SOLN
5000.0000 [IU] | Freq: Three times a day (TID) | INTRAMUSCULAR | Status: DC
  Filled 2014-08-13 (×3): qty 1

## 2014-08-13 MED ORDER — SODIUM CHLORIDE 0.9 % IJ SOLN
3.0000 mL | Freq: Two times a day (BID) | INTRAMUSCULAR | Status: DC
  Administered 2014-08-13 – 2014-08-14 (×2): 3 mL via INTRAVENOUS

## 2014-08-13 MED ORDER — INSULIN ASPART 100 UNIT/ML ~~LOC~~ SOLN
0.0000 [IU] | Freq: Three times a day (TID) | SUBCUTANEOUS | Status: DC
  Administered 2014-08-14: 2 [IU] via SUBCUTANEOUS

## 2014-08-13 MED ORDER — LORAZEPAM 0.5 MG PO TABS: 0.5000 mg | ORAL_TABLET | Freq: Two times a day (BID) | ORAL | Status: DC | PRN

## 2014-08-13 MED ORDER — PRAVASTATIN SODIUM 80 MG PO TABS
80.0000 mg | ORAL_TABLET | Freq: Every evening | ORAL | Status: DC
  Filled 2014-08-13: qty 1

## 2014-08-13 MED ORDER — INSULIN ASPART 100 UNIT/ML ~~LOC~~ SOLN
4.0000 [IU] | Freq: Once | SUBCUTANEOUS | Status: AC
  Administered 2014-08-14: 4 [IU] via SUBCUTANEOUS

## 2014-08-13 MED ORDER — MONTELUKAST SODIUM 10 MG PO TABS
10.0000 mg | ORAL_TABLET | Freq: Every day | ORAL | Status: DC
  Administered 2014-08-14: 10 mg via ORAL
  Filled 2014-08-13: qty 1

## 2014-08-13 MED ORDER — CLOPIDOGREL BISULFATE 75 MG PO TABS
75.0000 mg | ORAL_TABLET | Freq: Every morning | ORAL | Status: DC
  Administered 2014-08-14: 75 mg via ORAL
  Filled 2014-08-13: qty 1

## 2014-08-13 MED ORDER — IPRATROPIUM-ALBUTEROL 0.5-2.5 (3) MG/3ML IN SOLN
3.0000 mL | Freq: Once | RESPIRATORY_TRACT | Status: AC
  Administered 2014-08-13: 3 mL via RESPIRATORY_TRACT
  Filled 2014-08-13: qty 3

## 2014-08-13 MED ORDER — IPRATROPIUM-ALBUTEROL 0.5-2.5 (3) MG/3ML IN SOLN
3.0000 mL | RESPIRATORY_TRACT | Status: DC
  Administered 2014-08-13 – 2014-08-14 (×5): 3 mL via RESPIRATORY_TRACT
  Filled 2014-08-13 (×5): qty 3

## 2014-08-13 MED ORDER — PREDNISONE 50 MG PO TABS
50.0000 mg | ORAL_TABLET | Freq: Every day | ORAL | Status: DC
  Administered 2014-08-14: 50 mg via ORAL
  Filled 2014-08-13 (×2): qty 1

## 2014-08-13 NOTE — ED Notes (Signed)
Pt made aware of bed assignment 

## 2014-08-13 NOTE — ED Notes (Signed)
CBG 177. 

## 2014-08-13 NOTE — ED Provider Notes (Signed)
CSN: [id number]     Arrival date & time 08/13/14  1307 History   First MD Initiated Contact with Patient 08/13/14 1320     Chief Complaint  Patient presents with  . Shortness of Breath     (Consider location/radiation/quality/duration/timing/severity/associated sxs/prior Treatment) HPI Judith Boyer is a 69 y.o. female with a history of COPD, CAD, comes in for evaluation of shortness of breath. Patient states for the past week she has had labored breathing that she attributes to her COPD. She had called her PCP last week for some steroids, but was told to come to the ED for evaluation. She reports an associated dry cough, tightness in her chest, no chest pain. She denies fevers, chills, nausea or vomiting, diaphoresis, numbness or weakness, leg swelling. She tried a breathing treatment at home before coming to the ED without relief. No other aggravating or modifying factors. Home O2, 2L  Past Medical History  Diagnosis Date  . COPD (chronic obstructive pulmonary disease)   . Hypertension   . Coronary artery disease   . Shortness of breath   . High cholesterol   . Myocardial infarction 02/2011  . On home oxygen therapy     "2L; 24h/day" (02/08/2014)  . Pneumonia     "couple times" (02/08/2014)  . Type II diabetes mellitus     type 2  . Anemia   . History of blood transfusion 2014    "extremely anemic"   Past Surgical History  Procedure Laterality Date  . Shoulder arthroscopy w/ rotator cuff repair Left   . Left heart catheterization with coronary angiogram N/A 02/09/2011    Procedure: LEFT HEART CATHETERIZATION WITH CORONARY ANGIOGRAM;  Surgeon: Birdie Riddle, MD;  Location: [facility] CATH [facility];  Service: Cardiovascular;  Laterality: N/A;  . Tonsillectomy    . Abdominal hysterectomy  1970's  . Coronary angioplasty with stent placement  12/2007    "2"  . Cardiac catheterization  02/2011   Family History  Problem Relation Age of Onset  . Heart disease Maternal Grandmother   . Brain  cancer Brother   . Lung cancer Mother     smoked   History  Substance Use Topics  . Smoking status: Former Smoker -- 1.00 packs/day for 50 years    Types: Cigarettes    Quit date: 01/10/2013  . Smokeless tobacco: Never Used  . Alcohol Use: No   OB History    No data available     Review of Systems A 10 point review of systems was completed and was negative except for pertinent positives and negatives as mentioned in the history of present illness     Allergies  Doxycycline; Codeine; Latex; Lisinopril; Spiriva handihaler; Adhesive; and Dulera  Home Medications   Prior to Admission medications   Medication Sig Start Date End Date Taking? Authorizing Provider  albuterol (PROAIR HFA) 108 (90 BASE) MCG/ACT inhaler Inhale 2 puffs into the lungs every 4 (four) hours as needed for wheezing or shortness of breath. 01/14/14  Yes [name], MD  albuterol (PROVENTIL) (2.5 MG/3ML) 0.083% nebulizer solution Take 3 mLs (2.5 mg total) by nebulization every 4 (four) hours as needed for wheezing. 01/14/14  Yes [name], MD  ALPRAZolam Duanne Moron) 0.25 MG tablet Take 1 tablet (0.25 mg total) by mouth 4 (four) times daily as needed for anxiety (Take every 4-6 hours AS NEEDED). You may take 1/2 tab 02/22/14  Yes Dorian Heckle English, PA  clopidogrel (PLAVIX) 75 MG tablet Take 1 tablet (  75 mg total) by mouth every morning. 06/28/13  Yes [name], MD  dextromethorphan-guaiFENesin [facility] DM) 30-600 MG per 12 hr tablet Take 1 tablet by mouth every 12 (twelve) hours as needed. For cough   Yes Historical Provider, MD  feeding supplement, GLUCERNA SHAKE, (GLUCERNA SHAKE) LIQD Take 237 mLs by mouth 2 (two) times daily between meals. 02/11/14  Yes Annita Brod, MD  Fluticasone-Salmeterol (ADVAIR) 500-50 MCG/DOSE AEPB Inhale 1 puff into the lungs 2 (two) times daily. 01/14/14  Yes [name], MD  Iron TABS Take 1 tablet by mouth daily.   Yes Historical Provider, MD  LORazepam (ATIVAN) 0.5 MG  tablet Take 1 tablet (0.5 mg total) by mouth 2 (two) times daily as needed for anxiety. 02/27/13  Yes [name], MD  metFORMIN (GLUCOPHAGE) 500 MG tablet Take 1 tablet (500 mg total) by mouth 2 (two) times daily with a meal. 07/24/14  Yes [name], MD  montelukast (SINGULAIR) 10 MG tablet take 1 tablet by mouth once daily 07/09/14  Yes [name], MD  NEEDLE, DISP, 30 G (BD DISP NEEDLES) 30G X 1/2" MISC Inject 1 mL B-12 subcutaneously once a month 06/28/13  Yes [name], MD  pravastatin (PRAVACHOL) 80 MG tablet Take 1 tablet (80 mg total) by mouth every evening. 06/28/13  Yes [name], MD  Respiratory Therapy Supplies (FLUTTER) DEVI Use as directed 01/13/13  Yes Tanda Rockers, MD  valsartan (DIOVAN) 80 MG tablet Take 1 tablet (80 mg total) by mouth daily. 02/22/14  Yes Stephanie D English, PA  vitamin B-12 1000 MCG tablet Take 1 tablet (1,000 mcg total) by mouth daily. 02/11/14  Yes Annita Brod, MD  amoxicillin-clavulanate (AUGMENTIN) 875-125 MG per tablet Take 1 tablet by mouth every 12 (twelve) hours. Patient not taking: Reported on 08/13/2014 06/20/14   Aquilla Hacker, MD  bisoprolol (ZEBETA) 5 MG tablet Take 1 tablet (5 mg total) by mouth daily. Patient not taking: Reported on 08/13/2014 06/21/14   Aquilla Hacker, MD  GuaiFENesin (MUCINEX PO) Take by mouth.    Historical Provider, MD  nitroGLYCERIN (NITROSTAT) 0.4 MG SL tablet Place 1 tablet (0.4 mg total) under the tongue every 5 (five) minutes x 3 doses as needed for chest pain. 02/07/12   Dixie Dials, MD  predniSONE (DELTASONE) 20 MG tablet Take 3 tablets (60 mg total) by mouth daily with breakfast. Patient not taking: Reported on 08/13/2014 06/20/14   York Ram Melancon, MD   BP 142/62 mmHg  Pulse 91  Temp(Src) 97.9 F (36.6 C) (Oral)  Resp 16  SpO2 100% Physical Exam  Constitutional: She is oriented to person, place, and time. She appears well-developed and well-nourished.  HENT:  Head: Normocephalic and atraumatic.   Mouth/Throat: Oropharynx is clear and moist.  Eyes: Conjunctivae are normal. Pupils are equal, round, and reactive to light. Right eye exhibits no discharge. Left eye exhibits no discharge. No scleral icterus.  Neck: Neck supple.  Cardiovascular: Normal rate, regular rhythm and normal heart sounds.   Pulmonary/Chest: Effort normal and breath sounds normal.  Patient does appear to have increased work of breathing. Tachypneic.  Diminished lung sounds diffusely with mild wheezing in right upper lung fields. No other adventitious lung sounds  Abdominal: Soft. There is no tenderness.  Musculoskeletal: She exhibits no edema or tenderness.  Neurological: She is alert and oriented to person, place, and time.  Cranial Nerves II-XII grossly intact  Skin: Skin is warm and dry.  No rash noted.  Psychiatric: She has a normal mood and affect.  Nursing note and vitals reviewed.   ED Course  Procedures (including critical care time) Labs Review Labs Reviewed  BASIC METABOLIC PANEL - Abnormal; Notable for the following:    Chloride 100 (*)    Glucose, Bld 185 (*)    All other components within normal limits  CBC  BRAIN NATRIURETIC PEPTIDE  I-STAT TROPOININ, ED  Randolm Idol, ED    Imaging Review Dg Chest 2 View  08/13/2014   CLINICAL DATA:  Shortness of breath dry cough for 1 week  EXAM: CHEST  2 VIEW  COMPARISON:  Jun 18, 2014  FINDINGS: Lungs are mildly hyperexpanded. There is scarring in the left base. There is no edema or consolidation. The heart size and pulmonary vascularity within normal limits. No adenopathy. No bone lesions.  IMPRESSION: Scarring left base. Lungs mildly hyperexpanded. No edema or consolidation.   Electronically Signed   By: Lowella Grip III M.D.   On: 08/13/2014 14:13     EKG Interpretation None      ED ECG REPORT   Date: 08/13/2014  Rate: 99  Rhythm: normal sinus rhythm  QRS Axis: normal  Intervals: normal  ST/T Wave abnormalities: normal  Conduction  Disutrbances:none  Narrative Interpretation:   Old EKG Reviewed: unchanged  I have personally reviewed the EKG tracing and agree with the computerized printout as noted.  Meds given in ED:  Medications  ipratropium-albuterol (DUONEB) 0.5-2.5 (3) MG/3ML nebulizer solution 3 mL (3 mLs Nebulization Given 08/13/14 1439)  methylPREDNISolone sodium succinate (SOLU-MEDROL) 125 mg/2 mL injection 125 mg (125 mg Intravenous Given 08/13/14 1515)    New Prescriptions   No medications on file   Filed Vitals:   08/13/14 1422 08/13/14 1430 08/13/14 1500 08/13/14 1515  BP: 140/61 134/53 142/54 142/62  Pulse: 92 84 95 91  Temp:      TempSrc:      Resp: 17 21 16 16   SpO2: 98% 98% 96% 100%    MDM  Vitals stable - WNL -afebrile, 99% on room air. Patient with obvious labored breathing, tachypnea, despite breathing trxt and Solu-Medrol.  Chest x-ray shows scarring of the left base. Lungs mildly hyperexpanded without any edema or consolidation. EKG shows normal sinus rhythm. I believe patient would benefit from an overnight stay for COPD exacerbation. Low concern for PE. Consult to family medicine. Spoke with Family Medicine, Pt admitted. Patient stable, in good condition and is appropriate for transfer. I discussed all relevant lab findings and imaging results with pt and they verbalized understanding. Discussed f/u with PCP within 48 hrs and return precautions, pt very amenable to plan. Prior to patient admission, I discussed and reviewed this case with Dr. Roderic Palau, who also saw and evaluated the patient.   Final diagnoses:  COPD exacerbation        Comer Locket, PA-C 08/13/14 [place], MD 08/14/14 [phone]

## 2014-08-13 NOTE — H&P (Signed)
Judith Boyer Admission History and Physical Service Pager: [phone]  Patient name: Judith Boyer Medical record number: [id number] Date of birth: [birth year] Age: 69 y.o. Gender: female  Primary Care Provider: Jenny Reichmann, MD Consultants: None Code Status: Full  Chief Complaint: Dyspnea  Assessment and Plan: Judith Boyer is a 69 y.o. female presenting with Dyspnea . PMH is significant for COPD, DM2, HTN, CAD with MI in 2013   Dyspnea 2/2 COPD exacerbation. Patient satting in upper 90s on home oxygen level, no respiratory distress, though not much air movement on lung exam. Less likely ACS given lack of chest pain and normal troponin and EKG, though must consider given patient's cardiac history. Less likely PE given lack of tachycardia and chest pain. CXR without signs of infiltration or infection.  - Admit to telemetry under attending Dr Mingo Amber - Duonebs q4hrs / q2hrs prn - s/p solumedrol in ED, will start prednisone 50mg  tomorrow and complete 5 day course - Will start azithromycin 500mg  today, 250mg  daily for the next 4 days - Continue home singulair and LABA/ICS (will give dulera here) - Supplemental oxygen as needed to keep says >88%  CAD / HTN / HLD. Patient with stents in 2009, left heart cath in 2013. Normotensive in the ED. Last FLP in 2014: total cholesterol 162, TG 142, HDL 45, LDL 89 - Continue home plavix - Patient not on ACEi/ARB and denies taking bisoprolol - consider starting at discharge - Continue pravastatin 80mg  daily - Consider rechecking FLP and increasing statin intensity  DM2. On metformin at home. A1c in May was 7.4 - Hold home metformin - SSI  Anxiety -Continue home ativan  FEN/GI: Heart-healthy, carb modified diet, SLIV Prophylaxis: SubQ heparin  Disposition: Admitted to floor for observation. Anticipate discharge tomorrow pending improvement in respiratory status.   History of Present Illness: Judith Boyer is a 69 y.o. female presenting with dyspnea.  Patient reports that she has had increased work of breathing for about a week. Her home health nurse saw her today and recommended that she come to the ED for evaluation. States that her shortness of breath has been getting worse over the past week. Reports taking duoneb every 5 hours for the past week. Is also taking albuterol in-between duoneb doses. Says that the breathing treatments usually help, but then she feels more short of breath after about 4 hours. Patient endorses good compliance to her home regimen of advair, singular, and albuterol. Patient is on 2L of oxygen by nasal cannula at home and has been doing well.   Has had increased cough with no significant increased sputum production. No chest pain. No recent illnesses. No sick contacts.   In the ED, patient was given a dose of solu-medrol and a duoneb treatment. States that she feels like her breathing has improved some.   Review Of Systems: Per HPI, otherwise 12 point review of systems was performed and was unremarkable.  Patient Active Problem List   Diagnosis Date Noted  . Tachycardia   . Respiratory acidosis   . Coronary artery disease involving native coronary artery of native heart without angina pectoris   . Type 2 diabetes mellitus without complication   . Anxiety   . COPD with acute exacerbation 06/18/2014  . B12 deficiency 02/10/2014  . Protein-calorie malnutrition, severe 02/09/2014  . Acute respiratory distress 02/08/2014  . Vitamin B 12 deficiency 06/28/2013  . Palliative care encounter 02/25/2013  . Weakness generalized 02/25/2013  . COPD exacerbation  02/20/2013  . CAP (community acquired pneumonia) 02/20/2013  . Anemia 02/20/2013  . Nocturnal hypoxemia 12/30/2012  . Chest pain at rest 02/09/2011  . CAD in native artery 02/09/2011  . COPD GOLD III 02/06/2011  . Smoker 02/06/2011  . Hypertension 02/06/2011  . Diabetes mellitus type 2, controlled, without  complications 02/40/9735   Past Medical History: Past Medical History  Diagnosis Date  . COPD (chronic obstructive pulmonary disease)   . Hypertension   . Coronary artery disease   . Shortness of breath   . High cholesterol   . Myocardial infarction 02/2011  . On home oxygen therapy     "2L; 24h/day" (02/08/2014)  . Pneumonia     "couple times" (02/08/2014)  . Type II diabetes mellitus     type 2  . Anemia   . History of blood transfusion 2014    "extremely anemic"   Past Surgical History: Past Surgical History  Procedure Laterality Date  . Shoulder arthroscopy w/ rotator cuff repair Left   . Left heart catheterization with coronary angiogram N/A 02/09/2011    Procedure: LEFT HEART CATHETERIZATION WITH CORONARY ANGIOGRAM;  Surgeon: Birdie Riddle, MD;  Location: [place] [facility] CATH LAB;  Service: Cardiovascular;  Laterality: N/A;  . Tonsillectomy    . Abdominal hysterectomy  1970's  . Coronary angioplasty with stent placement  12/2007    "2"  . Cardiac catheterization  02/2011   Social History: History  Substance Use Topics  . Smoking status: Former Smoker -- 1.00 packs/day for 50 years    Types: Cigarettes    Quit date: 01/10/2013  . Smokeless tobacco: Never Used  . Alcohol Use: No   Please also refer to relevant sections of EMR.  Family History: Family History  Problem Relation Age of Onset  . Heart disease Maternal Grandmother   . Brain cancer Brother   . Lung cancer Mother     smoked   Allergies and Medications: Allergies  Allergen Reactions  . Doxycycline Anaphylaxis  . Codeine Other (See Comments)    hyperactivity  . Latex   . Lisinopril Cough  . Spiriva Handihaler [Tiotropium Bromide Monohydrate]     Rash and Urinary Retention  . Adhesive [Tape] Rash  . Dulera [Mometasone Furo-Formoterol Fum] Rash   Current Facility-Administered Medications on File Prior to Encounter  Medication Dose Route Frequency Provider Last Rate Last Dose  . cyanocobalamin ((VITAMIN B-12))  injection 1,000 mcg  1,000 mcg Intramuscular Q30 days Dorian Heckle English, PA   1,000 mcg at 02/22/14 1029   Current Outpatient Prescriptions on File Prior to Encounter  Medication Sig Dispense Refill  . albuterol (PROAIR HFA) 108 (90 BASE) MCG/ACT inhaler Inhale 2 puffs into the lungs every 4 (four) hours as needed for wheezing or shortness of breath. 1 Inhaler 11  . albuterol (PROVENTIL) (2.5 MG/3ML) 0.083% nebulizer solution Take 3 mLs (2.5 mg total) by nebulization every 4 (four) hours as needed for wheezing. 150 mL 11  . ALPRAZolam (XANAX) 0.25 MG tablet Take 1 tablet (0.25 mg total) by mouth 4 (four) times daily as needed for anxiety (Take every 4-6 hours AS NEEDED). You may take 1/2 tab 30 tablet 0  . clopidogrel (PLAVIX) 75 MG tablet Take 1 tablet (75 mg total) by mouth every morning. 30 tablet 11  . dextromethorphan-guaiFENesin (MUCINEX DM) 30-600 MG per 12 hr tablet Take 1 tablet by mouth every 12 (twelve) hours as needed. For cough    . feeding supplement, GLUCERNA SHAKE, (GLUCERNA SHAKE) LIQD Take 237  mLs by mouth 2 (two) times daily between meals. 60 Can 5  . Fluticasone-Salmeterol (ADVAIR) 500-50 MCG/DOSE AEPB Inhale 1 puff into the lungs 2 (two) times daily. 180 each 11  . Iron TABS Take 1 tablet by mouth daily.    Marland Kitchen LORazepam (ATIVAN) 0.5 MG tablet Take 1 tablet (0.5 mg total) by mouth 2 (two) times daily as needed for anxiety. 60 tablet 3  . metFORMIN (GLUCOPHAGE) 500 MG tablet Take 1 tablet (500 mg total) by mouth 2 (two) times daily with a meal. 180 tablet 2  . montelukast (SINGULAIR) 10 MG tablet take 1 tablet by mouth once daily 30 tablet 5  . NEEDLE, DISP, 30 G (BD DISP NEEDLES) 30G X 1/2" MISC Inject 1 mL B-12 subcutaneously once a month 100 each 0  . pravastatin (PRAVACHOL) 80 MG tablet Take 1 tablet (80 mg total) by mouth every evening. 30 tablet 11  . Respiratory Therapy Supplies (FLUTTER) DEVI Use as directed 1 each 0  . valsartan (DIOVAN) 80 MG tablet Take 1 tablet (80  mg total) by mouth daily. 30 tablet 5  . vitamin B-12 1000 MCG tablet Take 1 tablet (1,000 mcg total) by mouth daily. 30 tablet 1  . amoxicillin-clavulanate (AUGMENTIN) 875-125 MG per tablet Take 1 tablet by mouth every 12 (twelve) hours. (Patient not taking: Reported on 08/13/2014) 6 tablet 0  . bisoprolol (ZEBETA) 5 MG tablet Take 1 tablet (5 mg total) by mouth daily. (Patient not taking: Reported on 08/13/2014) 30 tablet 0  . GuaiFENesin (MUCINEX PO) Take by mouth.    . nitroGLYCERIN (NITROSTAT) 0.4 MG SL tablet Place 1 tablet (0.4 mg total) under the tongue every 5 (five) minutes x 3 doses as needed for chest pain. 25 tablet 1  . predniSONE (DELTASONE) 20 MG tablet Take 3 tablets (60 mg total) by mouth daily with breakfast. (Patient not taking: Reported on 08/13/2014) 3 tablet 0    Objective: BP 151/57 mmHg  Pulse 88  Temp(Src) 97.9 F (36.6 C) (Oral)  Resp 20  SpO2 99% Exam: General: 69 year old woman in NAD lying in bed resting comfortably, 2L [place] in place Eyes: PERRL, EOMI ENTM: MMM, O/P clear Neck: FROM, supple Cardiovascular: RRR, no murmurs appreciated,  Respiratory: NWOB, speaking in full sentences, Diminished breath sounds in all lung fields, mild wheezes scattered diffusely.  Abdomen: +BS, S, NT, ND MSK: Extremities without clubbing, cyanosis, or edema Skin: No rashes or areas of skin breakdown Neuro: Alert and conversational, no focal neurologic defects Psych: Anxious appearing. Normal affect and thought content.   Labs and Imaging: CBC BMET   Recent Labs Lab 08/13/14 1319  WBC 7.4  HGB 12.4  HCT 37.7  PLT 294    Recent Labs Lab 08/13/14 1319  NA 138  K 4.1  CL 100*  CO2 29  BUN 9  CREATININE 0.49  GLUCOSE 185*  CALCIUM 9.3     BNP 12.0  Trop 0.01  EKG: NSR, no ischemic changes  Dg Chest 2 View  08/13/2014   CLINICAL DATA:  Shortness of breath dry cough for 1 week  EXAM: CHEST  2 VIEW  COMPARISON:  Jun 18, 2014  FINDINGS: Lungs are mildly  hyperexpanded. There is scarring in the left base. There is no edema or consolidation. The heart size and pulmonary vascularity within normal limits. No adenopathy. No bone lesions.  IMPRESSION: Scarring left base. Lungs mildly hyperexpanded. No edema or consolidation.   Electronically Signed   By: Lowella Grip III  M.D.   On: 08/13/2014 14:13   Vivi Barrack, MD 08/13/2014, 3:53 PM PGY-2, [place] [facility] pager: [phone], text pages welcome

## 2014-08-13 NOTE — ED Notes (Signed)
MD at bedside. 

## 2014-08-13 NOTE — ED Notes (Signed)
Pt reports hx of copd, having increase in sob for several days. Denies recent cough. spo2 96% at triage.

## 2014-08-14 DIAGNOSIS — R0600 Dyspnea, unspecified: Secondary | ICD-10-CM

## 2014-08-14 DIAGNOSIS — J441 Chronic obstructive pulmonary disease with (acute) exacerbation: Secondary | ICD-10-CM | POA: Diagnosis not present

## 2014-08-14 LAB — GLUCOSE, CAPILLARY
Glucose-Capillary: 264 mg/dL — ABNORMAL HIGH (ref 65–99)
Glucose-Capillary: 137 mg/dL — ABNORMAL HIGH (ref 65–99)

## 2014-08-14 MED ORDER — PREDNISONE 20 MG PO TABS: 60.0000 mg | ORAL_TABLET | Freq: Every day | ORAL | Status: DC

## 2014-08-14 MED ORDER — AZITHROMYCIN 250 MG PO TABS: 250.0000 mg | ORAL_TABLET | Freq: Every day | ORAL | Status: DC

## 2014-08-14 MED ORDER — FLUTICASONE-SALMETEROL 500-50 MCG/DOSE IN AEPB
1.0000 | INHALATION_SPRAY | Freq: Two times a day (BID) | RESPIRATORY_TRACT | Status: DC
  Filled 2014-08-14: qty 14

## 2014-08-14 NOTE — Discharge Summary (Signed)
[facility] Discharge Summary  Patient name: Judith Boyer Medical record number: [id number] Date of birth: 1945/06/06 Age: 69 y.o. Gender: female Date of Admission: 08/13/2014  Date of Discharge: 08/14/2014  Admitting Physician: Alveda Reasons, MD  Primary Care Provider: Jenny Reichmann, MD Consultants: None  Indication for Hospitalization: COPD exacerbation  Discharge Diagnoses/Problem List:  COPD exacerbation Chronic respiratory failure due to COPD Tobacco use HTN CAD T2DM, well controlled PCM Vitamin B12 deficiency  Patient Active Problem List   Diagnosis Date Noted  . Dyspnea 08/13/2014  . Tachycardia   . Respiratory acidosis   . Coronary artery disease involving native coronary artery of native heart without angina pectoris   . Type 2 diabetes mellitus without complication   . Anxiety   . COPD with acute exacerbation 06/18/2014  . B12 deficiency 02/10/2014  . Protein-calorie malnutrition, severe 02/09/2014  . Acute respiratory distress 02/08/2014  . Vitamin B 12 deficiency 06/28/2013  . Palliative care encounter 02/25/2013  . Weakness generalized 02/25/2013  . COPD exacerbation 02/20/2013  . CAP (community acquired pneumonia) 02/20/2013  . Anemia 02/20/2013  . Nocturnal hypoxemia 12/30/2012  . Chest pain at rest 02/09/2011  . CAD in native artery 02/09/2011  . COPD GOLD III 02/06/2011  . Smoker 02/06/2011  . Hypertension 02/06/2011  . Diabetes mellitus type 2, controlled, without complications 77/41/2878    Disposition: Discharged home  Discharge Condition: Stable, improved  Discharge Exam:  General: 69 year old woman in NAD lying in bed resting comfortably, 2L [place] in place Cardiovascular: RRR, no murmur Respiratory: NWOB, speaking in full sentences, good air movement, no wheezes Psych: Anxious appearing. Normal affect and thought content.   Brief Hospital Course:  Judith Boyer is a 69 y.o. female presenting with  dyspnea due to COPD exacerbation. PMH is significant for COPD, DM2, HTN, CAD with MI in 2013  She presented with a week's worth of worsening dyspnea with dry nonproductive cough requiring the use of nebulizers 4 times a day. She was admitted for COPD exacerbation, started on steroids and antibiotics and observed overnight. She displayed significant improvement the following morning and was discharged with plan to continue 5 day course of azithromycin and prednisone as well as her usual 2L home oxygen.  Issues for Follow Up:  1. Follow up respiratory status  Significant Procedures: None  Significant Labs and Imaging:   Recent Labs Lab 08/13/14 1319  WBC 7.4  HGB 12.4  HCT 37.7  PLT 294    Recent Labs Lab 08/13/14 1319  NA 138  K 4.1  CL 100*  CO2 29  GLUCOSE 185*  BUN 9  CREATININE 0.49  CALCIUM 9.3    Results/Tests Pending at Time of Discharge: None  Discharge Medications:    Medication List    TAKE these medications        albuterol 108 (90 BASE) MCG/ACT inhaler  Commonly known as:  PROAIR HFA  Inhale 2 puffs into the lungs every 4 (four) hours as needed for wheezing or shortness of breath.     albuterol (2.5 MG/3ML) 0.083% nebulizer solution  Commonly known as:  PROVENTIL  Take 3 mLs (2.5 mg total) by nebulization every 4 (four) hours as needed for wheezing.     azithromycin 250 MG tablet  Commonly known as:  ZITHROMAX  Take 1 tablet (250 mg total) by mouth daily.     bisoprolol 5 MG tablet  Commonly known as:  ZEBETA  Take 1 tablet (5 mg  total) by mouth daily.     clopidogrel 75 MG tablet  Commonly known as:  PLAVIX  Take 1 tablet (75 mg total) by mouth every morning.     Fluticasone-Salmeterol 500-50 MCG/DOSE Aepb  Commonly known as:  ADVAIR  Inhale 1 puff into the lungs 2 (two) times daily.     guaiFENesin 600 MG 12 hr tablet  Commonly known as:  MUCINEX  Take 600 mg by mouth daily.     Iron Tabs  Take 1 tablet by mouth daily.     metFORMIN  500 MG tablet  Commonly known as:  GLUCOPHAGE  Take 1 tablet (500 mg total) by mouth 2 (two) times daily with a meal.     montelukast 10 MG tablet  Commonly known as:  SINGULAIR  take 1 tablet by mouth once daily     nitroGLYCERIN 0.4 MG SL tablet  Commonly known as:  NITROSTAT  Place 1 tablet (0.4 mg total) under the tongue every 5 (five) minutes x 3 doses as needed for chest pain.     pravastatin 80 MG tablet  Commonly known as:  PRAVACHOL  Take 1 tablet (80 mg total) by mouth every evening.     predniSONE 20 MG tablet  Commonly known as:  DELTASONE  Take 3 tablets (60 mg total) by mouth daily with breakfast.     valsartan 80 MG tablet  Commonly known as:  DIOVAN  Take 1 tablet (80 mg total) by mouth daily.        Discharge Instructions: Please refer to Patient Instructions section of EMR for full details.  Patient was counseled important signs and symptoms that should prompt return to medical care, changes in medications, dietary instructions, activity restrictions, and follow up appointments.   Follow-Up Appointments:     Follow-up Information    Follow up with DAUB, STEVE A, MD In 1 week.   Specialty:  Family Medicine   Why:  To recheck your breathing   Contact information:   [place] [address] [phone]       Patrecia Pour, MD 08/14/2014, 9:29 AM PGY-3, [facility] [place]

## 2014-08-14 NOTE — Discharge Instructions (Signed)
You are being treated for COPD exacerbation which seems to be stable. We will continue the prednisone every day for a total of 7 days and azithromycin for a total of 5 days. You should call Dr. Perfecto [place] office to schedule an appointment sometime this next week to be rechecked.

## 2014-08-14 NOTE — Progress Notes (Signed)
Pt. refused heparin last night because she states "I am already on Plavix". Called pharmacy to fix patients med list, Pt. Is allergic to [facility] and it was scheduled. Also contacted MD to get coverage for pt. CBG for the night. Yarielis Funaro 1:11 AM

## 2014-08-14 NOTE — Progress Notes (Addendum)
Reviewed discharge instructions with patient. IV removed no issues noted. Mbr to be escorted out via wheelchair. Will continue to monitor. Vitals stable.   Kylia Grajales, Mervin Kung RN

## 2014-08-16 DIAGNOSIS — J449 Chronic obstructive pulmonary disease, unspecified: Secondary | ICD-10-CM | POA: Diagnosis not present

## 2014-08-17 ENCOUNTER — Ambulatory Visit (INDEPENDENT_AMBULATORY_CARE_PROVIDER_SITE_OTHER): Payer: Medicare Other | Admitting: Emergency Medicine

## 2014-08-17 ENCOUNTER — Encounter: Payer: Self-pay | Admitting: Emergency Medicine

## 2014-08-17 VITALS — BP 147/65 | HR 105 | Temp 98.3°F | Resp 18 | Wt 97.6 lb

## 2014-08-17 DIAGNOSIS — E119 Type 2 diabetes mellitus without complications: Secondary | ICD-10-CM | POA: Diagnosis not present

## 2014-08-17 DIAGNOSIS — J438 Other emphysema: Secondary | ICD-10-CM | POA: Diagnosis not present

## 2014-08-17 DIAGNOSIS — R0602 Shortness of breath: Secondary | ICD-10-CM

## 2014-08-17 MED ORDER — ALPRAZOLAM 0.25 MG PO TABS: 0.2500 mg | ORAL_TABLET | Freq: Two times a day (BID) | ORAL | Status: DC | PRN

## 2014-08-17 MED ORDER — ALBUTEROL SULFATE HFA 108 (90 BASE) MCG/ACT IN AERS: 2.0000 | INHALATION_SPRAY | RESPIRATORY_TRACT | Status: DC | PRN

## 2014-08-17 MED ORDER — PREDNISONE 10 MG PO TABS: ORAL_TABLET | ORAL | Status: DC

## 2014-08-17 NOTE — Progress Notes (Signed)
   Subjective:  This chart was scribed for [name], MD by [name], Medical Scribe. This patient was seen in Room 21 and the patient's care was started at 3:29 PM.   Patient ID: Judith Boyer, female    DOB: [birth year], 69 y.o.   MRN: [id number]  HPI HPI Comments: AYAKA ANDES is a 69 y.o. female who presents to [facility] for a follow up on her recent hospital visit on 08/13/2014 for SOB. Pt presents here hooked up to Oxygen via Nasal Canula. Pt states that she is still presenting with occasional productive cough. She notes that she is compliant with the prednisone taper medication that she was prescribed which she has found relief from. Pt has 2 days of her prescription left. Pt indicates that her blood glucose levels is running around 165. Pt is also requesting a refill for xanax and proair.   Review of Systems  Respiratory: Positive for cough. Negative for shortness of breath.       Objective:   Physical Exam  Constitutional: She is oriented to person, place, and time. She appears well-developed and well-nourished. No distress.  HENT:  Head: Normocephalic and atraumatic.  Eyes: EOM are normal. Pupils are equal, round, and reactive to light.  Neck: Neck supple.  Cardiovascular: Normal rate.   Pulmonary/Chest: Effort normal.  Patient has a barrel chest with very poor air exchange. Bilateral mild expiratory wheezes no air exchange in the bases.  Neurological: She is alert and oriented to person, place, and time. No cranial nerve deficit.  Skin: Skin is warm and dry.  Psychiatric: She has a normal mood and affect. Her behavior is normal.  Nursing note and vitals reviewed.     Assessment & Plan:  Patient doing well post discharge. She is worried about stopping her prednisone in 3 days. I placed her on a 12 day taper. Will see in 2 weeks and see how she is doing. I asked her about spur Riva. She had difficulty with peripheral edema and urinary  retention and this was stopped by Dr. Melvyn Novas.I personally performed the services described in this documentation, which was scribed in my presence. The recorded information has been reviewed and is accurate.  Nena Jordan, MD

## 2014-08-18 NOTE — Patient Outreach (Signed)
[place] [facility]) Care Management  08/18/2014  JEARLENE BRIDWELL April 16, 1945 [id number]   Referral from MD, Ivery Quale, RN assigned to outreach.  Ronnell Freshwater. [place], [place] Management Yoe Assistant Phone: [phone] Fax: [phone]

## 2014-08-20 ENCOUNTER — Telehealth: Payer: Self-pay | Admitting: Emergency Medicine

## 2014-08-20 DIAGNOSIS — E539 Vitamin B deficiency, unspecified: Secondary | ICD-10-CM | POA: Diagnosis not present

## 2014-08-20 DIAGNOSIS — I2510 Atherosclerotic heart disease of native coronary artery without angina pectoris: Secondary | ICD-10-CM | POA: Diagnosis not present

## 2014-08-20 DIAGNOSIS — E119 Type 2 diabetes mellitus without complications: Secondary | ICD-10-CM | POA: Diagnosis not present

## 2014-08-20 DIAGNOSIS — Z9981 Dependence on supplemental oxygen: Secondary | ICD-10-CM | POA: Diagnosis not present

## 2014-08-20 DIAGNOSIS — I252 Old myocardial infarction: Secondary | ICD-10-CM | POA: Diagnosis not present

## 2014-08-20 DIAGNOSIS — Z7952 Long term (current) use of systemic steroids: Secondary | ICD-10-CM | POA: Diagnosis not present

## 2014-08-20 DIAGNOSIS — F419 Anxiety disorder, unspecified: Secondary | ICD-10-CM | POA: Diagnosis not present

## 2014-08-20 DIAGNOSIS — J441 Chronic obstructive pulmonary disease with (acute) exacerbation: Secondary | ICD-10-CM | POA: Diagnosis not present

## 2014-08-20 DIAGNOSIS — I10 Essential (primary) hypertension: Secondary | ICD-10-CM | POA: Diagnosis not present

## 2014-08-20 DIAGNOSIS — E785 Hyperlipidemia, unspecified: Secondary | ICD-10-CM | POA: Diagnosis not present

## 2014-08-20 DIAGNOSIS — Z87891 Personal history of nicotine dependence: Secondary | ICD-10-CM | POA: Diagnosis not present

## 2014-08-20 NOTE — Telephone Encounter (Signed)
Patient's home healthcare nurse called to get verbal orders to continue caring for patient who recently got out of the hospital. She states that the patient also finished a round of Prednisone.   [place] ([place] Nurse)

## 2014-08-20 NOTE — Telephone Encounter (Signed)
Okay for verbal orders. 

## 2014-08-21 NOTE — Telephone Encounter (Signed)
Please give verbal orders for patient to continue caring for [facility] for at least the next 2-3 weeks

## 2014-08-23 ENCOUNTER — Other Ambulatory Visit: Payer: Self-pay

## 2014-08-23 NOTE — Telephone Encounter (Signed)
Spoke with nurse to give verbal orders.

## 2014-08-23 NOTE — Patient Outreach (Signed)
[place] [facility]) Care Management  08/23/2014  Judith Boyer [birth year] [id number]   Telephonic Care Management Note  Referral Date:  08/18/2014 Referral Source:  MD Referral Issue:  Diabetes Type 2 without complication, Shortness of breath, emphysema  Triage outreach call #1.  Patient not reached.  Voice Message left with name and contact #.  RN CM rescheduled for next outreach call.   Mariann Laster, RN, BSN, [facility], CCM  [employer] Management Coordinator [phone] Office [phone] Direct [phone] Cell

## 2014-08-25 ENCOUNTER — Other Ambulatory Visit: Payer: Self-pay

## 2014-08-25 NOTE — Patient Outreach (Signed)
[facility]) Care Management  08/25/2014  Judith Boyer 03/30/1945 [id number]   Telephonic Care Management Note  Referral Date: 08/18/2014 Referral Source: MD Referral Issue: Diabetes Type 2 without complication, Shortness of breath, emphysema PCP:  [name], MD - Last appt last week 7/16 and next appt 09/07/14. Home Health provider:  [facility] DME:  oxygen with [employer].  Admissions: 2   And ED visits: 0 over past 6 months.    Triage outreach call #2. Patient reached. Patient states last admission was 06/2014 and she has had HH:  SN services since that time.  States HH is currently attempting to get order renewed.   Patient states reason for home health is because her breathing is so bad.    Patient confirmed diagnosis of DM and Emphysema.  Patient did not know her A1C Per Epic A1C history:   Results for [name], [name] (MRN [id number]) as of 08/25/2014 09:48  Ref. Range 12/26/2012 14:39 02/20/2013 23:15 01/14/2014 12:21 05/01/2014 12:53 06/18/2014 17:40  Hemoglobin A1C Latest Ref Range: 4.8-5.6 % 7.7 9.1 (H) 6.1 5.6 7.4 (H)   Patient states her A1C was elevated during a time she was on Prednisone.   Patient states she is not sure she wants to participate and her breathing is not good right now.  Patient requested a call back next week and stated she would think about her decision to participate.    Plan: RN CM encouraged patient to discuss [facility] referral on her next Primary MD appt.  RN CM rescheduled for next outreach call within one week to follow-up on decision to consent for services with [facility].     Mariann Laster, RN, BSN, [facility], CCM  [employer] Management Coordinator [phone] Office [phone] Direct [phone] Cell

## 2014-08-27 DIAGNOSIS — I10 Essential (primary) hypertension: Secondary | ICD-10-CM | POA: Diagnosis not present

## 2014-08-27 DIAGNOSIS — E539 Vitamin B deficiency, unspecified: Secondary | ICD-10-CM | POA: Diagnosis not present

## 2014-08-27 DIAGNOSIS — Z9981 Dependence on supplemental oxygen: Secondary | ICD-10-CM | POA: Diagnosis not present

## 2014-08-27 DIAGNOSIS — Z87891 Personal history of nicotine dependence: Secondary | ICD-10-CM | POA: Diagnosis not present

## 2014-08-27 DIAGNOSIS — F419 Anxiety disorder, unspecified: Secondary | ICD-10-CM | POA: Diagnosis not present

## 2014-08-27 DIAGNOSIS — Z7952 Long term (current) use of systemic steroids: Secondary | ICD-10-CM | POA: Diagnosis not present

## 2014-08-27 DIAGNOSIS — J441 Chronic obstructive pulmonary disease with (acute) exacerbation: Secondary | ICD-10-CM | POA: Diagnosis not present

## 2014-08-27 DIAGNOSIS — I252 Old myocardial infarction: Secondary | ICD-10-CM | POA: Diagnosis not present

## 2014-08-27 DIAGNOSIS — E119 Type 2 diabetes mellitus without complications: Secondary | ICD-10-CM | POA: Diagnosis not present

## 2014-08-27 DIAGNOSIS — I2510 Atherosclerotic heart disease of native coronary artery without angina pectoris: Secondary | ICD-10-CM | POA: Diagnosis not present

## 2014-08-27 DIAGNOSIS — E785 Hyperlipidemia, unspecified: Secondary | ICD-10-CM | POA: Diagnosis not present

## 2014-09-01 ENCOUNTER — Other Ambulatory Visit: Payer: Self-pay

## 2014-09-01 ENCOUNTER — Telehealth: Payer: Self-pay

## 2014-09-01 NOTE — Patient Outreach (Signed)
[place] [facility]) Care Management  09/01/2014  Judith Boyer Jun 07, 1945 [id number]   Unsuccessful outreach letter sent to patient. (See letters).   Mariann Laster, RN, BSN, [facility], CCM  [employer] Management Coordinator [phone] Office [phone] Direct [phone] Cell

## 2014-09-01 NOTE — Patient Outreach (Signed)
[place] [facility]) Care Management  09/01/2014  Judith Boyer 20-Mar-1945 [id number]   Telephonic Care Management Note  Referral Date: 08/18/2014 Referral Source: MD Referral Issue: Diabetes Type 2 without complication, Shortness of breath, emphysema PCP: [name], MD - Last appt last week 7/16 and next appt 09/07/14. Home Health provider: [facility] DME: oxygen with [employer].  Admissions: 2 And ED visits: 0 over past 6 months.   Triage outreach call #3.  Patient states she is not able to talk and not doing well with her breathing. States ok to call another day.  H/O patient refused services on last outreach call #2.  RN CM encouraged patient to discuss [facility] referral on her next Primary MD appt.   RN CM rescheduled next outreach call within one week to follow-up on decision to consent for services with [facility]. H/O last admission was 06/2014 and HH: SN ordered at discharge.  Patient reported [place] currently attempting to get order renewed. Patient stated reason for home health services is because her breathing is so bad.   Plan:    RN CM will send patient unsuccessful outreach letter to provide additional identification of [facility] and attempted calls.  RN CM will outreach to [facility] HHS provider [phone] to report patient's C/O not doing well.  Per Epic MD has updated order for two weeks until next MD appt 09/07/14.   Mariann Laster, RN, BSN, [facility], CCM  [employer] Management Coordinator [phone] Office [phone] Direct [phone] Cell

## 2014-09-01 NOTE — Patient Outreach (Signed)
[place] [facility]) Care Management  09/01/2014  Judith Boyer April 02, 1945 [id number]  Care Coordination Note:  Outreach call to Maish Vaya.  VMM left for [facility] SN as follows.   RN CM advised of patient's C/O each call of inability to complete calls due to breathing issues and not doing well all 3 attempts.  Please return call back to RN CM to discuss any potential gaps of care that [facility] could provide assistance.  Please follow-up with patient regarding her complaints. RN CM left name and contact # for call back.   Mariann Laster, RN, BSN, [facility], CCM  [employer] Management Coordinator [phone] Office [phone] Direct [phone] Cell

## 2014-09-03 DIAGNOSIS — F419 Anxiety disorder, unspecified: Secondary | ICD-10-CM | POA: Diagnosis not present

## 2014-09-03 DIAGNOSIS — J441 Chronic obstructive pulmonary disease with (acute) exacerbation: Secondary | ICD-10-CM | POA: Diagnosis not present

## 2014-09-03 DIAGNOSIS — E539 Vitamin B deficiency, unspecified: Secondary | ICD-10-CM | POA: Diagnosis not present

## 2014-09-03 DIAGNOSIS — I252 Old myocardial infarction: Secondary | ICD-10-CM | POA: Diagnosis not present

## 2014-09-03 DIAGNOSIS — Z87891 Personal history of nicotine dependence: Secondary | ICD-10-CM | POA: Diagnosis not present

## 2014-09-03 DIAGNOSIS — E119 Type 2 diabetes mellitus without complications: Secondary | ICD-10-CM | POA: Diagnosis not present

## 2014-09-03 DIAGNOSIS — Z7952 Long term (current) use of systemic steroids: Secondary | ICD-10-CM | POA: Diagnosis not present

## 2014-09-03 DIAGNOSIS — Z9981 Dependence on supplemental oxygen: Secondary | ICD-10-CM | POA: Diagnosis not present

## 2014-09-03 DIAGNOSIS — I2510 Atherosclerotic heart disease of native coronary artery without angina pectoris: Secondary | ICD-10-CM | POA: Diagnosis not present

## 2014-09-03 DIAGNOSIS — E785 Hyperlipidemia, unspecified: Secondary | ICD-10-CM | POA: Diagnosis not present

## 2014-09-03 DIAGNOSIS — I10 Essential (primary) hypertension: Secondary | ICD-10-CM | POA: Diagnosis not present

## 2014-09-06 ENCOUNTER — Emergency Department (HOSPITAL_COMMUNITY): Payer: Medicare Other

## 2014-09-06 ENCOUNTER — Emergency Department (HOSPITAL_COMMUNITY)
Admission: EM | Admit: 2014-09-06 | Discharge: 2014-09-06 | Disposition: A | Discharge status: Home / Self Care | Payer: Medicare Other | Source: Home / Self Care | Attending: Emergency Medicine | Admitting: Emergency Medicine

## 2014-09-06 ENCOUNTER — Encounter (HOSPITAL_COMMUNITY): Payer: Self-pay | Admitting: *Deleted

## 2014-09-06 DIAGNOSIS — Z87891 Personal history of nicotine dependence: Secondary | ICD-10-CM

## 2014-09-06 DIAGNOSIS — Z881 Allergy status to other antibiotic agents status: Secondary | ICD-10-CM | POA: Diagnosis not present

## 2014-09-06 DIAGNOSIS — Z9981 Dependence on supplemental oxygen: Secondary | ICD-10-CM

## 2014-09-06 DIAGNOSIS — Z862 Personal history of diseases of the blood and blood-forming organs and certain disorders involving the immune mechanism: Secondary | ICD-10-CM

## 2014-09-06 DIAGNOSIS — J441 Chronic obstructive pulmonary disease with (acute) exacerbation: Secondary | ICD-10-CM

## 2014-09-06 DIAGNOSIS — I252 Old myocardial infarction: Secondary | ICD-10-CM

## 2014-09-06 DIAGNOSIS — E780 Pure hypercholesterolemia: Secondary | ICD-10-CM | POA: Insufficient documentation

## 2014-09-06 DIAGNOSIS — I2510 Atherosclerotic heart disease of native coronary artery without angina pectoris: Secondary | ICD-10-CM

## 2014-09-06 DIAGNOSIS — Z792 Long term (current) use of antibiotics: Secondary | ICD-10-CM | POA: Insufficient documentation

## 2014-09-06 DIAGNOSIS — Z79899 Other long term (current) drug therapy: Secondary | ICD-10-CM | POA: Insufficient documentation

## 2014-09-06 DIAGNOSIS — Z8701 Personal history of pneumonia (recurrent): Secondary | ICD-10-CM | POA: Insufficient documentation

## 2014-09-06 DIAGNOSIS — J984 Other disorders of lung: Secondary | ICD-10-CM | POA: Diagnosis not present

## 2014-09-06 DIAGNOSIS — E46 Unspecified protein-calorie malnutrition: Secondary | ICD-10-CM | POA: Diagnosis not present

## 2014-09-06 DIAGNOSIS — Z9889 Other specified postprocedural states: Secondary | ICD-10-CM | POA: Insufficient documentation

## 2014-09-06 DIAGNOSIS — F419 Anxiety disorder, unspecified: Secondary | ICD-10-CM | POA: Diagnosis not present

## 2014-09-06 DIAGNOSIS — I10 Essential (primary) hypertension: Secondary | ICD-10-CM

## 2014-09-06 DIAGNOSIS — Z888 Allergy status to other drugs, medicaments and biological substances status: Secondary | ICD-10-CM | POA: Diagnosis not present

## 2014-09-06 DIAGNOSIS — Z91040 Latex allergy status: Secondary | ICD-10-CM

## 2014-09-06 DIAGNOSIS — R1310 Dysphagia, unspecified: Secondary | ICD-10-CM | POA: Diagnosis not present

## 2014-09-06 DIAGNOSIS — Z515 Encounter for palliative care: Secondary | ICD-10-CM | POA: Diagnosis not present

## 2014-09-06 DIAGNOSIS — R0602 Shortness of breath: Secondary | ICD-10-CM | POA: Diagnosis not present

## 2014-09-06 DIAGNOSIS — Z7951 Long term (current) use of inhaled steroids: Secondary | ICD-10-CM

## 2014-09-06 DIAGNOSIS — E785 Hyperlipidemia, unspecified: Secondary | ICD-10-CM | POA: Diagnosis not present

## 2014-09-06 DIAGNOSIS — K219 Gastro-esophageal reflux disease without esophagitis: Secondary | ICD-10-CM | POA: Diagnosis not present

## 2014-09-06 DIAGNOSIS — E119 Type 2 diabetes mellitus without complications: Secondary | ICD-10-CM

## 2014-09-06 DIAGNOSIS — Z885 Allergy status to narcotic agent status: Secondary | ICD-10-CM | POA: Diagnosis not present

## 2014-09-06 DIAGNOSIS — R05 Cough: Secondary | ICD-10-CM | POA: Diagnosis not present

## 2014-09-06 DIAGNOSIS — R000 Tachycardia, unspecified: Secondary | ICD-10-CM | POA: Diagnosis not present

## 2014-09-06 DIAGNOSIS — Z9861 Coronary angioplasty status: Secondary | ICD-10-CM

## 2014-09-06 DIAGNOSIS — E1165 Type 2 diabetes mellitus with hyperglycemia: Secondary | ICD-10-CM | POA: Diagnosis not present

## 2014-09-06 DIAGNOSIS — Z681 Body mass index (BMI) 19 or less, adult: Secondary | ICD-10-CM | POA: Diagnosis not present

## 2014-09-06 DIAGNOSIS — R062 Wheezing: Secondary | ICD-10-CM | POA: Diagnosis not present

## 2014-09-06 DIAGNOSIS — J9621 Acute and chronic respiratory failure with hypoxia: Secondary | ICD-10-CM | POA: Diagnosis not present

## 2014-09-06 LAB — CBC
HCT: 37.5 % (ref 36.0–46.0)
HEMOGLOBIN: 12.1 g/dL (ref 12.0–15.0)
MCH: 30.4 pg (ref 26.0–34.0)
MCHC: 32.3 g/dL (ref 30.0–36.0)
MCV: 94.2 fL (ref 78.0–100.0)
Platelets: 213 10*3/uL (ref 150–400)
RBC: 3.98 MIL/uL (ref 3.87–5.11)
RDW: 12.6 % (ref 11.5–15.5)
WBC: 7.1 10*3/uL (ref 4.0–10.5)

## 2014-09-06 LAB — COMPREHENSIVE METABOLIC PANEL
ALK PHOS: 66 U/L (ref 38–126)
ALT: 25 U/L (ref 14–54)
AST: 22 U/L (ref 15–41)
Albumin: 3.3 g/dL — ABNORMAL LOW (ref 3.5–5.0)
Anion gap: 6 (ref 5–15)
BUN: 7 mg/dL (ref 6–20)
CO2: 32 mmol/L (ref 22–32)
Calcium: 8.6 mg/dL — ABNORMAL LOW (ref 8.9–10.3)
Chloride: 100 mmol/L — ABNORMAL LOW (ref 101–111)
Creatinine, Ser: 0.43 mg/dL — ABNORMAL LOW (ref 0.44–1.00)
GFR calc Af Amer: >60 mL/min (ref >60)
GFR calc non Af Amer: >60 mL/min (ref >60)
GLUCOSE: 214 mg/dL — AB (ref 65–99)
Potassium: 3.5 mmol/L (ref 3.5–5.1)
Sodium: 138 mmol/L (ref 135–145)
Total Bilirubin: 0.3 mg/dL (ref 0.3–1.2)
Total Protein: 5.6 g/dL — ABNORMAL LOW (ref 6.5–8.1)

## 2014-09-06 LAB — I-STAT TROPONIN, ED: TROPONIN I, POC: 0 ng/mL (ref 0.00–0.08)

## 2014-09-06 MED ORDER — METHYLPREDNISOLONE SODIUM SUCC 125 MG IJ SOLR
125.0000 mg | Freq: Once | INTRAMUSCULAR | Status: AC
  Administered 2014-09-06: 125 mg via INTRAVENOUS
  Filled 2014-09-06: qty 2

## 2014-09-06 MED ORDER — IPRATROPIUM-ALBUTEROL 0.5-2.5 (3) MG/3ML IN SOLN
3.0000 mL | Freq: Once | RESPIRATORY_TRACT | Status: AC
  Administered 2014-09-06: 3 mL via RESPIRATORY_TRACT
  Filled 2014-09-06: qty 3

## 2014-09-06 MED ORDER — PREDNISONE 10 MG PO TABS: 60.0000 mg | ORAL_TABLET | Freq: Every day | ORAL | Status: DC

## 2014-09-06 NOTE — ED Notes (Signed)
Pt in c/o increased shortness of breath over the last two days, worse today, dyspnea at rest, pt has increased home O2 with no relief, speaking in short phrases on arrival, on O2 at 3L

## 2014-09-06 NOTE — Discharge Instructions (Signed)

## 2014-09-06 NOTE — ED Provider Notes (Signed)
CSN: [id number]     Arrival date & time 09/06/14  1800 History   First MD Initiated Contact with Patient 09/06/14 1812     Chief Complaint  Patient presents with  . Shortness of Breath     (Consider location/radiation/quality/duration/timing/severity/associated sxs/prior Treatment) Patient is a 69 y.o. female presenting with shortness of breath. The history is provided by the patient.  Shortness of Breath Severity:  Moderate Onset quality:  Gradual Duration:  2 days Timing:  Constant Progression:  Worsening Chronicity:  Recurrent Relieved by:  Nothing Worsened by:  Nothing tried Ineffective treatments:  None tried Associated symptoms: cough and wheezing   Associated symptoms: no abdominal pain, no chest pain and no fever   Risk factors comment:  Severe COPD   Past Medical History  Diagnosis Date  . COPD (chronic obstructive pulmonary disease)   . Hypertension   . Coronary artery disease   . Shortness of breath   . High cholesterol   . Myocardial infarction 02/2011  . On home oxygen therapy     "2L; 24h/day" (02/08/2014)  . Pneumonia     "couple times" (02/08/2014)  . Type II diabetes mellitus     type 2  . Anemia   . History of blood transfusion 2014    "extremely anemic"   Past Surgical History  Procedure Laterality Date  . Shoulder arthroscopy w/ rotator cuff repair Left   . Left heart catheterization with coronary angiogram N/A 02/09/2011    Procedure: LEFT HEART CATHETERIZATION WITH CORONARY ANGIOGRAM;  Surgeon: Birdie Riddle, MD;  Location: [facility] CATH LAB;  Service: Cardiovascular;  Laterality: N/A;  . Tonsillectomy    . Abdominal hysterectomy  1970's  . Coronary angioplasty with stent placement  12/2007    "2"  . Cardiac catheterization  02/2011   Family History  Problem Relation Age of Onset  . Heart disease Maternal Grandmother   . Brain cancer Brother   . Lung cancer Mother     smoked   History  Substance Use Topics  . Smoking status: Former Smoker --  1.00 packs/day for 50 years    Types: Cigarettes    Quit date: 01/10/2013  . Smokeless tobacco: Never Used  . Alcohol Use: No   OB History    No data available     Review of Systems  Constitutional: Negative for fever.  Respiratory: Positive for cough, shortness of breath and wheezing.   Cardiovascular: Negative for chest pain.  Gastrointestinal: Negative for abdominal pain.  All other systems reviewed and are negative.     Allergies  Doxycycline; Codeine; Dulera; Spiriva handihaler; Adhesive; Latex; and Lisinopril  Home Medications   Prior to Admission medications   Medication Sig Start Date End Date Taking? Authorizing Provider  albuterol (PROAIR HFA) 108 (90 BASE) MCG/ACT inhaler Inhale 2 puffs into the lungs every 4 (four) hours as needed for wheezing or shortness of breath. 08/17/14  Yes [name], MD  albuterol (PROVENTIL) (2.5 MG/3ML) 0.083% nebulizer solution Take 3 mLs (2.5 mg total) by nebulization every 4 (four) hours as needed for wheezing. 01/14/14  Yes [name], MD  ALPRAZolam Duanne Moron) 0.25 MG tablet Take 1 tablet (0.25 mg total) by mouth 2 (two) times daily as needed for anxiety. 08/17/14  Yes [name], MD  clopidogrel (PLAVIX) 75 MG tablet Take 1 tablet (75 mg total) by mouth every morning. 06/28/13  Yes [name], MD  Fluticasone-Salmeterol (ADVAIR) 500-50 MCG/DOSE AEPB Inhale 1 puff into the lungs  2 (two) times daily. 01/14/14  Yes [name], MD  guaiFENesin (MUCINEX) 600 MG 12 hr tablet Take 600 mg by mouth daily.   Yes Historical Provider, MD  Iron TABS Take 1 tablet by mouth daily.   Yes Historical Provider, MD  metFORMIN (GLUCOPHAGE) 500 MG tablet Take 1 tablet (500 mg total) by mouth 2 (two) times daily with a meal. 07/24/14  Yes [name], MD  montelukast (SINGULAIR) 10 MG tablet take 1 tablet by mouth once daily Patient taking differently: take 1 tablet by mouth once every evening 07/09/14  Yes [name], MD  nitroGLYCERIN  (NITROSTAT) 0.4 MG SL tablet Place 1 tablet (0.4 mg total) under the tongue every 5 (five) minutes x 3 doses as needed for chest pain. 02/07/12  Yes Dixie Dials, MD  pravastatin (PRAVACHOL) 80 MG tablet Take 1 tablet (80 mg total) by mouth every evening. 06/28/13  Yes [name], MD  valsartan (DIOVAN) 80 MG tablet Take 1 tablet (80 mg total) by mouth daily. Patient taking differently: Take 80 mg by mouth every evening.  02/22/14  Yes Dorian Heckle English, PA  azithromycin (ZITHROMAX) 250 MG tablet Take 1 tablet (250 mg total) by mouth daily. 08/14/14   Patrecia Pour, MD  bisoprolol (ZEBETA) 5 MG tablet Take 1 tablet (5 mg total) by mouth daily. Patient not taking: Reported on 08/17/2014 06/21/14   Aquilla Hacker, MD  predniSONE (DELTASONE) 10 MG tablet Take 6 tablets (60 mg total) by mouth daily. 09/07/14   Leo Grosser, MD   BP 144/59 mmHg  Pulse 97  Resp 26  Wt 98 lb (44.453 kg)  SpO2 100% Physical Exam  Constitutional: She is oriented to person, place, and time. She appears well-developed and well-nourished. No distress.  HENT:  Head: Normocephalic.  Eyes: Conjunctivae are normal.  Neck: Neck supple. No tracheal deviation present.  Cardiovascular: Normal rate and regular rhythm.   Pulmonary/Chest: Effort normal. Tachypnea noted. No respiratory distress. She has decreased breath sounds (bilaterally with limited air movement).  Abdominal: Soft. She exhibits no distension.  Neurological: She is alert and oriented to person, place, and time.  Skin: Skin is warm and dry.  Psychiatric: She has a normal mood and affect.    ED Course  Procedures (including critical care time) Labs Review Labs Reviewed  COMPREHENSIVE METABOLIC PANEL - Abnormal; Notable for the following:    Chloride 100 (*)    Glucose, Bld 214 (*)    Creatinine, Ser 0.43 (*)    Calcium 8.6 (*)    Total Protein 5.6 (*)    Albumin 3.3 (*)    All other components within normal limits  CBC  I-STAT TROPOININ, ED     Imaging Review Dg Chest 1 View  09/06/2014   CLINICAL DATA:  Trouble breathing for 3 days.  EXAM: CHEST  1 VIEW  COMPARISON:  Radiographs 08/13/2014  FINDINGS: Normal cardiac silhouette. Lungs are hyperinflated. There is biapical pulmonary scarring unchanged from prior. No pneumothorax. No pulmonary edema. No infiltrate.  IMPRESSION: Hyper inflated lungs with apical scarring.  No acute findings.   Electronically Signed   By: Suzy Bouchard M.D.   On: 09/06/2014 18:25   I independently viewed and interpreted the above radiology studies and agree with radiologist report.    EKG Interpretation   Date/Time:  Monday September 06 2014 18:07:25 EDT Ventricular Rate:  113 PR Interval:  130 QRS Duration: 74 QT Interval:  330 QTC Calculation: 452 R Axis:  71 Text Interpretation:  Sinus tachycardia Otherwise normal ECG Confirmed by  Demitrios Molyneux MD, Tony Granquist [phone]) on 09/06/2014 6:13:22 PM      MDM   Final diagnoses:  COPD with exacerbation    69 year old female presents with acute COPD exacerbation. She has had increased work of breathing over the last 2 days after finishing a course of steroids last week. She states that she gets frequent exacerbations. She has decrease in her work of breathing following a DuoNeb here and has treatments available at home that she has been taking every 5 hours. She was given a dose of IV steroids here and will be started on a burst of prednisone for outpatient management. She is on 2 L of oxygen at home and this was saturating well on 2 L throughout her emergency department course. She has follow-up tomorrow with her primary care physician and recommended that she keep that appointment for recheck and medication reconciliation.    Leo Grosser, MD 09/06/14 2002

## 2014-09-07 ENCOUNTER — Encounter (HOSPITAL_COMMUNITY): Payer: Self-pay | Admitting: Emergency Medicine

## 2014-09-07 ENCOUNTER — Encounter: Payer: Self-pay | Admitting: Emergency Medicine

## 2014-09-07 ENCOUNTER — Inpatient Hospital Stay (HOSPITAL_COMMUNITY)
Admission: EM | Admit: 2014-09-07 | Discharge: 2014-09-14 | DRG: 190 | Disposition: A | Discharge status: Home / Self Care | Payer: Medicare Other | Attending: Family Medicine | Admitting: Family Medicine

## 2014-09-07 ENCOUNTER — Ambulatory Visit (INDEPENDENT_AMBULATORY_CARE_PROVIDER_SITE_OTHER): Payer: Medicare Other | Admitting: Emergency Medicine

## 2014-09-07 VITALS — BP 133/67 | HR 119 | Temp 97.1°F | Resp 22 | Wt 95.0 lb

## 2014-09-07 DIAGNOSIS — E119 Type 2 diabetes mellitus without complications: Secondary | ICD-10-CM

## 2014-09-07 DIAGNOSIS — R0602 Shortness of breath: Secondary | ICD-10-CM | POA: Diagnosis not present

## 2014-09-07 DIAGNOSIS — E43 Unspecified severe protein-calorie malnutrition: Secondary | ICD-10-CM

## 2014-09-07 DIAGNOSIS — Z789 Other specified health status: Secondary | ICD-10-CM | POA: Diagnosis not present

## 2014-09-07 DIAGNOSIS — Z87891 Personal history of nicotine dependence: Secondary | ICD-10-CM | POA: Diagnosis not present

## 2014-09-07 DIAGNOSIS — Z885 Allergy status to narcotic agent status: Secondary | ICD-10-CM | POA: Diagnosis not present

## 2014-09-07 DIAGNOSIS — Z881 Allergy status to other antibiotic agents status: Secondary | ICD-10-CM

## 2014-09-07 DIAGNOSIS — Z888 Allergy status to other drugs, medicaments and biological substances status: Secondary | ICD-10-CM | POA: Diagnosis not present

## 2014-09-07 DIAGNOSIS — I252 Old myocardial infarction: Secondary | ICD-10-CM

## 2014-09-07 DIAGNOSIS — R739 Hyperglycemia, unspecified: Secondary | ICD-10-CM | POA: Diagnosis not present

## 2014-09-07 DIAGNOSIS — R000 Tachycardia, unspecified: Secondary | ICD-10-CM | POA: Diagnosis present

## 2014-09-07 DIAGNOSIS — Z515 Encounter for palliative care: Secondary | ICD-10-CM | POA: Insufficient documentation

## 2014-09-07 DIAGNOSIS — Z9981 Dependence on supplemental oxygen: Secondary | ICD-10-CM | POA: Diagnosis not present

## 2014-09-07 DIAGNOSIS — I10 Essential (primary) hypertension: Secondary | ICD-10-CM | POA: Diagnosis present

## 2014-09-07 DIAGNOSIS — E785 Hyperlipidemia, unspecified: Secondary | ICD-10-CM | POA: Diagnosis present

## 2014-09-07 DIAGNOSIS — J441 Chronic obstructive pulmonary disease with (acute) exacerbation: Secondary | ICD-10-CM | POA: Diagnosis not present

## 2014-09-07 DIAGNOSIS — R069 Unspecified abnormalities of breathing: Secondary | ICD-10-CM | POA: Diagnosis not present

## 2014-09-07 DIAGNOSIS — Z8701 Personal history of pneumonia (recurrent): Secondary | ICD-10-CM | POA: Diagnosis not present

## 2014-09-07 DIAGNOSIS — Z681 Body mass index (BMI) 19 or less, adult: Secondary | ICD-10-CM | POA: Diagnosis not present

## 2014-09-07 DIAGNOSIS — Z79899 Other long term (current) drug therapy: Secondary | ICD-10-CM | POA: Diagnosis not present

## 2014-09-07 DIAGNOSIS — I2510 Atherosclerotic heart disease of native coronary artery without angina pectoris: Secondary | ICD-10-CM | POA: Diagnosis present

## 2014-09-07 DIAGNOSIS — J449 Chronic obstructive pulmonary disease, unspecified: Secondary | ICD-10-CM | POA: Diagnosis present

## 2014-09-07 DIAGNOSIS — J9621 Acute and chronic respiratory failure with hypoxia: Secondary | ICD-10-CM | POA: Insufficient documentation

## 2014-09-07 DIAGNOSIS — E1165 Type 2 diabetes mellitus with hyperglycemia: Secondary | ICD-10-CM | POA: Diagnosis present

## 2014-09-07 DIAGNOSIS — Z7951 Long term (current) use of inhaled steroids: Secondary | ICD-10-CM

## 2014-09-07 DIAGNOSIS — R1310 Dysphagia, unspecified: Secondary | ICD-10-CM | POA: Diagnosis not present

## 2014-09-07 DIAGNOSIS — E46 Unspecified protein-calorie malnutrition: Secondary | ICD-10-CM | POA: Diagnosis present

## 2014-09-07 DIAGNOSIS — K219 Gastro-esophageal reflux disease without esophagitis: Secondary | ICD-10-CM | POA: Diagnosis present

## 2014-09-07 DIAGNOSIS — F419 Anxiety disorder, unspecified: Secondary | ICD-10-CM | POA: Diagnosis present

## 2014-09-07 DIAGNOSIS — R0600 Dyspnea, unspecified: Secondary | ICD-10-CM | POA: Diagnosis not present

## 2014-09-07 LAB — BASIC METABOLIC PANEL
Anion gap: 10 (ref 5–15)
BUN: 13 mg/dL (ref 6–20)
CALCIUM: 9.4 mg/dL (ref 8.9–10.3)
CHLORIDE: 97 mmol/L — AB (ref 101–111)
CO2: 30 mmol/L (ref 22–32)
Creatinine, Ser: 0.51 mg/dL (ref 0.44–1.00)
GFR calc Af Amer: >60 mL/min (ref >60)
GFR calc non Af Amer: >60 mL/min (ref >60)
Glucose, Bld: 346 mg/dL — ABNORMAL HIGH (ref 65–99)
Potassium: 4.3 mmol/L (ref 3.5–5.1)
Sodium: 137 mmol/L (ref 135–145)

## 2014-09-07 LAB — GLUCOSE, POCT (MANUAL RESULT ENTRY): POC Glucose: 366 mg/dl — AB (ref 70–99)

## 2014-09-07 LAB — CBC
HEMATOCRIT: 37 % (ref 36.0–46.0)
Hemoglobin: 12 g/dL (ref 12.0–15.0)
MCH: 29.7 pg (ref 26.0–34.0)
MCHC: 32.4 g/dL (ref 30.0–36.0)
MCV: 91.6 fL (ref 78.0–100.0)
Platelets: 223 10*3/uL (ref 150–400)
RBC: 4.04 MIL/uL (ref 3.87–5.11)
RDW: 12.5 % (ref 11.5–15.5)
WBC: 8.4 10*3/uL (ref 4.0–10.5)

## 2014-09-07 LAB — GLUCOSE, CAPILLARY: GLUCOSE-CAPILLARY: 344 mg/dL — AB (ref 65–99)

## 2014-09-07 MED ORDER — IPRATROPIUM BROMIDE 0.02 % IN SOLN: 0.5000 mg | Freq: Once | RESPIRATORY_TRACT | Status: DC

## 2014-09-07 MED ORDER — PREDNISONE 50 MG PO TABS
60.0000 mg | ORAL_TABLET | Freq: Every day | ORAL | Status: DC
  Administered 2014-09-08 – 2014-09-14 (×7): 60 mg via ORAL
  Filled 2014-09-07 (×9): qty 1

## 2014-09-07 MED ORDER — HEPARIN SODIUM (PORCINE) 5000 UNIT/ML IJ SOLN
5000.0000 [IU] | Freq: Three times a day (TID) | INTRAMUSCULAR | Status: DC
  Filled 2014-09-07 (×20): qty 1

## 2014-09-07 MED ORDER — LEVOFLOXACIN 500 MG PO TABS
500.0000 mg | ORAL_TABLET | Freq: Every day | ORAL | Status: DC
  Administered 2014-09-07 – 2014-09-09 (×3): 500 mg via ORAL
  Filled 2014-09-07 (×3): qty 1

## 2014-09-07 MED ORDER — ALPRAZOLAM 0.25 MG PO TABS
0.2500 mg | ORAL_TABLET | Freq: Two times a day (BID) | ORAL | Status: DC | PRN
  Administered 2014-09-07 – 2014-09-09 (×3): 0.25 mg via ORAL
  Filled 2014-09-07 (×3): qty 1

## 2014-09-07 MED ORDER — ACETAMINOPHEN 325 MG PO TABS: 650.0000 mg | ORAL_TABLET | Freq: Four times a day (QID) | ORAL | Status: DC | PRN

## 2014-09-07 MED ORDER — ALBUTEROL SULFATE (2.5 MG/3ML) 0.083% IN NEBU: 2.5000 mg | INHALATION_SOLUTION | Freq: Once | RESPIRATORY_TRACT | Status: DC

## 2014-09-07 MED ORDER — IRBESARTAN 75 MG PO TABS
75.0000 mg | ORAL_TABLET | Freq: Every day | ORAL | Status: DC
  Administered 2014-09-08 – 2014-09-14 (×8): 75 mg via ORAL
  Filled 2014-09-07 (×9): qty 1

## 2014-09-07 MED ORDER — CLOPIDOGREL BISULFATE 75 MG PO TABS
75.0000 mg | ORAL_TABLET | Freq: Every morning | ORAL | Status: DC
  Administered 2014-09-08 – 2014-09-14 (×7): 75 mg via ORAL
  Filled 2014-09-07 (×7): qty 1

## 2014-09-07 MED ORDER — ALBUTEROL SULFATE (2.5 MG/3ML) 0.083% IN NEBU
5.0000 mg | INHALATION_SOLUTION | Freq: Once | RESPIRATORY_TRACT | Status: AC
  Administered 2014-09-07: 5 mg via RESPIRATORY_TRACT
  Filled 2014-09-07: qty 6

## 2014-09-07 MED ORDER — SODIUM CHLORIDE 0.9 % IV SOLN: 250.0000 mL | INTRAVENOUS | Status: DC | PRN

## 2014-09-07 MED ORDER — ACETAMINOPHEN 650 MG RE SUPP: 650.0000 mg | Freq: Four times a day (QID) | RECTAL | Status: DC | PRN

## 2014-09-07 MED ORDER — SODIUM CHLORIDE 0.9 % IJ SOLN
3.0000 mL | Freq: Two times a day (BID) | INTRAMUSCULAR | Status: DC
  Administered 2014-09-08 – 2014-09-14 (×13): 3 mL via INTRAVENOUS

## 2014-09-07 MED ORDER — GUAIFENESIN ER 600 MG PO TB12
600.0000 mg | ORAL_TABLET | Freq: Every day | ORAL | Status: DC
Start: 2014-09-08 — End: 2014-09-14
  Administered 2014-09-08 – 2014-09-14 (×7): 600 mg via ORAL
  Filled 2014-09-07 (×7): qty 1

## 2014-09-07 MED ORDER — SODIUM CHLORIDE 0.9 % IJ SOLN
3.0000 mL | INTRAMUSCULAR | Status: DC | PRN
  Administered 2014-09-08 – 2014-09-09 (×2): 3 mL via INTRAVENOUS
  Filled 2014-09-07 (×2): qty 3

## 2014-09-07 MED ORDER — IPRATROPIUM-ALBUTEROL 0.5-2.5 (3) MG/3ML IN SOLN
3.0000 mL | RESPIRATORY_TRACT | Status: DC | PRN
  Administered 2014-09-07: 3 mL via RESPIRATORY_TRACT

## 2014-09-07 MED ORDER — IPRATROPIUM-ALBUTEROL 0.5-2.5 (3) MG/3ML IN SOLN
RESPIRATORY_TRACT | Status: AC
  Administered 2014-09-07: 3 mL via RESPIRATORY_TRACT
  Filled 2014-09-07: qty 3

## 2014-09-07 MED ORDER — INSULIN ASPART 100 UNIT/ML ~~LOC~~ SOLN: 0.0000 [IU] | Freq: Three times a day (TID) | SUBCUTANEOUS | Status: DC

## 2014-09-07 MED ORDER — FLUTICASONE-SALMETEROL 500-50 MCG/DOSE IN AEPB
1.0000 | INHALATION_SPRAY | Freq: Two times a day (BID) | RESPIRATORY_TRACT | Status: DC
  Administered 2014-09-08 – 2014-09-10 (×4): 1 via RESPIRATORY_TRACT
  Filled 2014-09-07: qty 14

## 2014-09-07 MED ORDER — NON FORMULARY: 1.0000 | Freq: Two times a day (BID) | Status: DC

## 2014-09-07 MED ORDER — IPRATROPIUM-ALBUTEROL 0.5-2.5 (3) MG/3ML IN SOLN
3.0000 mL | RESPIRATORY_TRACT | Status: DC
  Administered 2014-09-08 – 2014-09-10 (×14): 3 mL via RESPIRATORY_TRACT
  Filled 2014-09-07 (×15): qty 3

## 2014-09-07 MED ORDER — MONTELUKAST SODIUM 10 MG PO TABS
10.0000 mg | ORAL_TABLET | Freq: Every day | ORAL | Status: DC
  Administered 2014-09-09 – 2014-09-13 (×5): 10 mg via ORAL
  Filled 2014-09-07 (×6): qty 1

## 2014-09-07 MED ORDER — PRAVASTATIN SODIUM 80 MG PO TABS
80.0000 mg | ORAL_TABLET | Freq: Every evening | ORAL | Status: DC
  Administered 2014-09-07 – 2014-09-13 (×7): 80 mg via ORAL
  Filled 2014-09-07: qty 1
  Filled 2014-09-07: qty 2
  Filled 2014-09-07: qty 1
  Filled 2014-09-07: qty 2
  Filled 2014-09-07 (×4): qty 1

## 2014-09-07 NOTE — ED Provider Notes (Signed)
CSN: [id number]     Arrival date & time 09/07/14  1720 History   First MD Initiated Contact with Patient 09/07/14 1735     Chief Complaint  Patient presents with  . Shortness of Breath     (Consider location/radiation/quality/duration/timing/severity/associated sxs/prior Treatment) Patient is a 69 y.o. female presenting with shortness of breath.  Shortness of Breath Severity:  Severe Onset quality:  Gradual Duration:  1 week Timing:  Constant Progression:  Worsening Chronicity:  Recurrent Context comment:  Seen in ED last night and DC'd with treatment for COPD exac.  Followed up with PCP today who felt she should be admitted. Relieved by: nebulizer. Worsened by:  Exertion Associated symptoms: cough   Associated symptoms: no fever     Past Medical History  Diagnosis Date  . COPD (chronic obstructive pulmonary disease)   . Hypertension   . Coronary artery disease   . Shortness of breath   . High cholesterol   . Myocardial infarction 02/2011  . On home oxygen therapy     "2L; 24h/day" (02/08/2014)  . Pneumonia     "couple times" (02/08/2014)  . Type II diabetes mellitus     type 2  . Anemia   . History of blood transfusion 2014    "extremely anemic"   Past Surgical History  Procedure Laterality Date  . Shoulder arthroscopy w/ rotator cuff repair Left   . Left heart catheterization with coronary angiogram N/A 02/09/2011    Procedure: LEFT HEART CATHETERIZATION WITH CORONARY ANGIOGRAM;  Surgeon: Birdie Riddle, MD;  Location: [facility];  Service: Cardiovascular;  Laterality: N/A;  . Tonsillectomy    . Abdominal hysterectomy  1970's  . Coronary angioplasty with stent placement  12/2007    "2"  . Cardiac catheterization  02/2011   Family History  Problem Relation Age of Onset  . Heart disease Maternal Grandmother   . Brain cancer Brother   . Lung cancer Mother     smoked   History  Substance Use Topics  . Smoking status: Former Smoker -- 1.00 packs/day for 50 years    Types: Cigarettes    Quit date: 01/10/2013  . Smokeless tobacco: Never Used  . Alcohol Use: No   OB History    No data available     Review of Systems  Constitutional: Negative for fever.  Respiratory: Positive for cough and shortness of breath.   All other systems reviewed and are negative.     Allergies  Doxycycline; Codeine; Dulera; Spiriva handihaler; Adhesive; Latex; and Lisinopril  Home Medications   Prior to Admission medications   Medication Sig Start Date End Date Taking? Authorizing Provider  albuterol (PROAIR HFA) 108 (90 BASE) MCG/ACT inhaler Inhale 2 puffs into the lungs every 4 (four) hours as needed for wheezing or shortness of breath. 08/17/14   [name], MD  albuterol (PROVENTIL) (2.5 MG/3ML) 0.083% nebulizer solution Take 3 mLs (2.5 mg total) by nebulization every 4 (four) hours as needed for wheezing. 01/14/14   [name], MD  ALPRAZolam Duanne Moron) 0.25 MG tablet Take 1 tablet (0.25 mg total) by mouth 2 (two) times daily as needed for anxiety. 08/17/14   [name], MD  bisoprolol (ZEBETA) 5 MG tablet Take 1 tablet (5 mg total) by mouth daily. Patient not taking: Reported on 08/17/2014 06/21/14   Aquilla Hacker, MD  clopidogrel (PLAVIX) 75 MG tablet Take 1 tablet (75 mg total) by mouth every morning. 06/28/13   [name], MD  Fluticasone-Salmeterol (ADVAIR) 500-50 MCG/DOSE AEPB Inhale 1 puff into the lungs 2 (two) times daily. 01/14/14   [name], MD  guaiFENesin (MUCINEX) 600 MG 12 hr tablet Take 600 mg by mouth daily.    Historical Provider, MD  Iron TABS Take 1 tablet by mouth daily.    Historical Provider, MD  metFORMIN (GLUCOPHAGE) 500 MG tablet Take 1 tablet (500 mg total) by mouth 2 (two) times daily with a meal. 07/24/14   [name], MD  montelukast (SINGULAIR) 10 MG tablet take 1 tablet by mouth once daily Patient taking differently: take 1 tablet by mouth once every evening 07/09/14   [name], MD  nitroGLYCERIN (NITROSTAT) 0.4 MG  SL tablet Place 1 tablet (0.4 mg total) under the tongue every 5 (five) minutes x 3 doses as needed for chest pain. 02/07/12   Dixie Dials, MD  pravastatin (PRAVACHOL) 80 MG tablet Take 1 tablet (80 mg total) by mouth every evening. 06/28/13   [name], MD  valsartan (DIOVAN) 80 MG tablet Take 1 tablet (80 mg total) by mouth daily. Patient taking differently: Take 80 mg by mouth every evening.  02/22/14   Stephanie D English, PA   BP 130/49 mmHg  Pulse 116  Temp(Src) 98.6 F (37 C) (Oral)  Resp 24  SpO2 99% Physical Exam  Constitutional: She is oriented to person, place, and time. She appears well-developed and well-nourished. No distress.  HENT:  Head: Normocephalic and atraumatic.  Mouth/Throat: Oropharynx is clear and moist.  Eyes: Conjunctivae are normal. Pupils are equal, round, and reactive to light. No scleral icterus.  Neck: Neck supple.  Cardiovascular: Normal rate, regular rhythm, normal heart sounds and intact distal pulses.   No murmur heard. Pulmonary/Chest: Effort normal. No stridor. Tachypnea noted. No respiratory distress. She has decreased breath sounds (poor air movement throughout.). She has no rales.  Abdominal: Soft. Bowel sounds are normal. She exhibits no distension. There is no tenderness.  Musculoskeletal: Normal range of motion.  Neurological: She is alert and oriented to person, place, and time.  Skin: Skin is warm and dry. No rash noted.  Psychiatric: She has a normal mood and affect. Her behavior is normal.  Nursing note and vitals reviewed.   ED Course  Procedures (including critical care time) Labs Review Labs Reviewed  BASIC METABOLIC PANEL - Abnormal; Notable for the following:    Chloride 97 (*)    Glucose, Bld 346 (*)    All other components within normal limits  CBC    Imaging Review Dg Chest 1 View  09/06/2014   CLINICAL DATA:  Trouble breathing for 3 days.  EXAM: CHEST  1 VIEW  COMPARISON:  Radiographs 08/13/2014  FINDINGS: Normal  cardiac silhouette. Lungs are hyperinflated. There is biapical pulmonary scarring unchanged from prior. No pneumothorax. No pulmonary edema. No infiltrate.  IMPRESSION: Hyper inflated lungs with apical scarring.  No acute findings.   Electronically Signed   By: Suzy Bouchard M.D.   On: 09/06/2014 18:25     EKG Interpretation   Date/Time:  Tuesday September 07 2014 17:33:11 EDT Ventricular Rate:  115 PR Interval:  147 QRS Duration: 81 QT Interval:  356 QTC Calculation: 492 R Axis:   84 Text Interpretation:  Sinus tachycardia Borderline right axis deviation  Borderline prolonged QT interval Baseline wander No significant change was  found Confirmed by [facility]  MD, TREY ([id number]) on 09/07/2014 6:11:14 PM      MDM   Final diagnoses:  COPD  with acute exacerbation    PCP sent to ED due to persistent SOB and hyperglycemia in setting of steroid use.  Will be admitted by family practice.  She has had 60mg  prednisone today.      Serita Grit, MD 09/07/14 [phone]

## 2014-09-07 NOTE — Progress Notes (Signed)
This chart was scribed for [name], MD by Marti Sleigh, Medical Scribe. This patient was seen in Room 22 and the patient's care was started at 3:58 PM.  Chief Complaint:  Chief Complaint  Patient presents with  . follow up from ER    last night...breathing treatment and CXR    HPI: Judith Boyer is a 69 y.o. female with a hx of end-stage COPD who reports to [facility] today for a COPD follow up. Pt reported to the ER yesterday due to worsening SOB. She was given a duoneb and prednisone in the ER and told to follow up with Dr. Everlene Farrier. Pt states she has access to [facility], but she is not sure how they will be able to help her. Pt was off steroids for one week before reporting to the ER. Pt's breathing sx steadily worsened as soon as she went down on her steroids.   Pt states she may be interested in moving over to an assisted living facility.  Past Medical History  Diagnosis Date  . COPD (chronic obstructive pulmonary disease)   . Hypertension   . Coronary artery disease   . Shortness of breath   . High cholesterol   . Myocardial infarction 02/2011  . On home oxygen therapy     "2L; 24h/day" (02/08/2014)  . Pneumonia     "couple times" (02/08/2014)  . Type II diabetes mellitus     type 2  . Anemia   . History of blood transfusion 2014    "extremely anemic"   Past Surgical History  Procedure Laterality Date  . Shoulder arthroscopy w/ rotator cuff repair Left   . Left heart catheterization with coronary angiogram N/A 02/09/2011    Procedure: LEFT HEART CATHETERIZATION WITH CORONARY ANGIOGRAM;  Surgeon: Birdie Riddle, MD;  Location: [facility]ATH [facility];  Service: Cardiovascular;  Laterality: N/A;  . Tonsillectomy    . Abdominal hysterectomy  1970's  . Coronary angioplasty with stent placement  12/2007    "2"  . Cardiac catheterization  02/2011   History   Social History  . Marital Status: Widowed    Spouse Name: N/A  . Number of Children: N/A  . Years of Education:  N/A   Social History Main Topics  . Smoking status: Former Smoker -- 1.00 packs/day for 50 years    Types: Cigarettes    Quit date: 01/10/2013  . Smokeless tobacco: Never Used  . Alcohol Use: No  . Drug Use: No  . Sexual Activity: No   Other Topics Concern  . None   Social History Narrative   Family History  Problem Relation Age of Onset  . Heart disease Maternal Grandmother   . Brain cancer Brother   . Lung cancer Mother     smoked   Allergies  Allergen Reactions  . Doxycycline Anaphylaxis  . Codeine Other (See Comments)    hyperactivity  . Dulera [Mometasone Furo-Formoterol Fum] Hives and Rash  . Spiriva Handihaler [Tiotropium Bromide Monohydrate] Rash and Other (See Comments)    Rash and Urinary Retention  . Adhesive [Tape] Rash  . Latex Other (See Comments)    unknown  . Lisinopril Cough   Prior to Admission medications   Medication Sig Start Date End Date Taking? Authorizing Provider  albuterol (PROAIR HFA) 108 (90 BASE) MCG/ACT inhaler Inhale 2 puffs into the lungs every 4 (four) hours as needed for wheezing or shortness of breath. 08/17/14  Yes [name], MD  albuterol ([place]) (  2.5 MG/3ML) 0.083% nebulizer solution Take 3 mLs (2.5 mg total) by nebulization every 4 (four) hours as needed for wheezing. 01/14/14  Yes [name], MD  ALPRAZolam Duanne Moron) 0.25 MG tablet Take 1 tablet (0.25 mg total) by mouth 2 (two) times daily as needed for anxiety. 08/17/14  Yes [name], MD  clopidogrel (PLAVIX) 75 MG tablet Take 1 tablet (75 mg total) by mouth every morning. 06/28/13  Yes [name], MD  Fluticasone-Salmeterol (ADVAIR) 500-50 MCG/DOSE AEPB Inhale 1 puff into the lungs 2 (two) times daily. 01/14/14  Yes [name], MD  guaiFENesin (MUCINEX) 600 MG 12 hr tablet Take 600 mg by mouth daily.   Yes Historical Provider, MD  Iron TABS Take 1 tablet by mouth daily.   Yes Historical Provider, MD  metFORMIN (GLUCOPHAGE) 500 MG tablet Take 1 tablet (500 mg  total) by mouth 2 (two) times daily with a meal. 07/24/14  Yes [name], MD  montelukast (SINGULAIR) 10 MG tablet take 1 tablet by mouth once daily Patient taking differently: take 1 tablet by mouth once every evening 07/09/14  Yes [name], MD  nitroGLYCERIN (NITROSTAT) 0.4 MG SL tablet Place 1 tablet (0.4 mg total) under the tongue every 5 (five) minutes x 3 doses as needed for chest pain. 02/07/12  Yes Dixie Dials, MD  pravastatin (PRAVACHOL) 80 MG tablet Take 1 tablet (80 mg total) by mouth every evening. 06/28/13  Yes [name], MD  valsartan (DIOVAN) 80 MG tablet Take 1 tablet (80 mg total) by mouth daily. Patient taking differently: Take 80 mg by mouth every evening.  02/22/14  Yes Dorian Heckle English, PA  bisoprolol (ZEBETA) 5 MG tablet Take 1 tablet (5 mg total) by mouth daily. Patient not taking: Reported on 08/17/2014 06/21/14   Aquilla Hacker, MD     ROS: The patient denies fevers, chills, night sweats, unintentional weight loss, chest pain, palpitations, nausea, vomiting, abdominal pain, dysuria, hematuria, melena, numbness, or tingling.   Endorses constant, chronic SOB.  All other systems have been reviewed and were otherwise negative with the exception of those mentioned in the HPI and as above.    PHYSICAL EXAM: Filed Vitals:   09/07/14 1553  BP: 133/67  Pulse: 119  Temp: 97.1 F (36.2 C)  Resp: 22   Body mass index is 17.66 kg/(m^2).   General: Sitting, breathing rapidly with pursed lips. Body in a forward position.  HEENT:  Normocephalic, atraumatic, oropharynx patent. Eye: [name] Cardiovascular: Tachycardia without murmurs.  Respiratory: Very limited breath sounds in both bases. Significantly increased A-P diameter.  Abdominal: No organomegaly, abdomen is soft and non-tender, positive bowel sounds.  No masses. Musculoskeletal: Gait intact. No edema, tenderness Skin: No rashes. Neurologic: Facial musculature symmetric. Psychiatric: Patient acts  appropriately throughout our interaction. Lymphatic: No cervical or submandibular lymphadenopathy Genitourinary/Anorectal: No acute findings   LABS: Results for orders placed or performed in visit on 09/07/14  POCT glucose (manual entry)  Result Value Ref Range   POC Glucose 366 (A) 70 - 99 mg/dl     EKG/XRAY:   Primary read interpreted by Dr. Everlene Farrier at [facility].   ASSESSMENT/PLAN: Patient with COPD gold 3 recent worsening since being off prednisone. She was seen in the emergency room last night given IV Solu-Medrol. Single view chest x-ray showed COPD. This morning her sugar was greater than 400. She was discharged on prednisone 60 mg a day. I am concerned she will not be able to  manage her hyperglycemia at home with being on the increased dose of sterile age. I did speak with Dr. Francoise Ceo  on call for family medicine. I also reviewed case with the charge nurse. She was given 1 DuoNeb here prior to transfer. This causes extreme fatigue with the treatment. Patient currently living at home with her son next door. I'm not sure how long this will continue. At the present time she is a full code.I personally performed the services described in this documentation, which was scribed in my presence. The recorded information has been reviewed and is accurate.  Nena Jordan, MD   Gross sideeffects, risk and benefits, and alternatives of medications d/w patient. Patient is aware that all medications have potential sideeffects and we are unable to predict every sideeffect or drug-drug interaction that may occur.  [name] MD 09/07/2014 5:19 PM

## 2014-09-07 NOTE — ED Notes (Signed)
MD at bedside; admitting

## 2014-09-07 NOTE — ED Notes (Addendum)
Report attempted room dirty. Charge nurse states room has not been assigned yet.

## 2014-09-07 NOTE — ED Notes (Signed)
Hx of copd, in hospital 3 weeks ago for copd issues, today she had followup for recent hospitalization.  CBG was over 400 this morning, had steroids last night, takes metformin.  Has had difficulty breathing today, was given duoneb, at dr's office with no relief.  Physician requested that patient come to facility.  HR 114.  132/59, 98% on 2 liters, CBG 366.

## 2014-09-07 NOTE — H&P (Signed)
[facility] Admission History and Physical Service Pager: [phone]  Patient name: Judith Boyer Medical record number: [id number] Date of birth: 1945-07-20 Age: 69 y.o. Gender: female  Primary Care Provider: Jenny Reichmann, MD Consultants: none Code Status: Full  Chief Complaint: short of breath  Assessment and Plan: FRITZIE PRIOLEAU is a 69 y.o. female presenting with shortness of breath. PMH is significant for COPD with recent exacerbation requiring hospitalization in July, HTN, CAD, HLD, T2DM  # COPD exacerbation: on home 2L O2. Recurrent exacerbation. Saturating 96-100% on 2-3L O2. Vitals also with tachycardia, tachypnea. She has never been intubated in the past for COPD issues. Last hospitalization received azithromycin, has allergy to doxycycline.  - duonebs q4hrs scheduled, q2hrs PRN - prednisone 60mg  daily - levaquin 500mg  daily - continuous O2 keep sat >92% - continue home advair - monitor respiratory status closely  # Tachycardia: sinus on EKG, suspected from albuterol treatments - repeat EKG in AM if still tachycardic  # T2DM: CBG elevated in 300s, 400s, but no evidence of DKA/HONK.  - add SSI (thin, adjust as needed) - CBG qAC qHS  # HTN: BP 120-150s/50-60s. - will hold bisoprolol for now - hospital formulary equivalent for valsartan  # Anxiety: stable - continue home xanax  # CAD: stable - continue home plavix  # HLD: stable - continue home pravastatin (has intolerances to atorva)  FEN/GI: diet carb mod / saline lock Prophylaxis: heparin sq  Disposition: admit  History of Present Illness: Judith Boyer is a 69 y.o. female presenting with shortness of breath. Pt previously treated for COPD exacerbation in July, but never fully got better and worsened after stopping steroids. She was seen in clinic with repeat steroids shortly after last discharge, and the ED yesterday. She again went to clinic today and with  elevated sugars and continued shortness of breath sent to ED for further management. She says she has been having some cough, but usually not productive (has had some thick sputum occasionally), no chest pains. She has been using her albuterol-ipratropium about 5-6 times a day as well as her advair. The breathing treatments help but don't resolve her symptoms completely.  Review Of Systems: Per HPI with the following additions: no CP, no n/v/d Otherwise 12 point review of systems was performed and was unremarkable.  Patient Active Problem List   Diagnosis Date Noted  . Dyspnea 08/13/2014  . Tachycardia   . Respiratory acidosis   . Coronary artery disease involving native coronary artery of native heart without angina pectoris   . Type 2 diabetes mellitus without complication   . Anxiety   . COPD with acute exacerbation 06/18/2014  . B12 deficiency 02/10/2014  . Protein-calorie malnutrition, severe 02/09/2014  . Acute respiratory distress 02/08/2014  . Vitamin B 12 deficiency 06/28/2013  . Palliative care encounter 02/25/2013  . Weakness generalized 02/25/2013  . COPD exacerbation 02/20/2013  . CAP (community acquired pneumonia) 02/20/2013  . Anemia 02/20/2013  . Nocturnal hypoxemia 12/30/2012  . Chest pain at rest 02/09/2011  . CAD in native artery 02/09/2011  . COPD GOLD III 02/06/2011  . Smoker 02/06/2011  . Hypertension 02/06/2011  . Diabetes mellitus type 2, controlled, without complications 14/78/2956   Past Medical History: Past Medical History  Diagnosis Date  . COPD (chronic obstructive pulmonary disease)   . Hypertension   . Coronary artery disease   . Shortness of breath   . High cholesterol   . Myocardial infarction 02/2011  .  On home oxygen therapy     "2L; 24h/day" (02/08/2014)  . Pneumonia     "couple times" (02/08/2014)  . Type II diabetes mellitus     type 2  . Anemia   . History of blood transfusion 2014    "extremely anemic"   Past Surgical  History: Past Surgical History  Procedure Laterality Date  . Shoulder arthroscopy w/ rotator cuff repair Left   . Left heart catheterization with coronary angiogram N/A 02/09/2011    Procedure: LEFT HEART CATHETERIZATION WITH CORONARY ANGIOGRAM;  Surgeon: Birdie Riddle, MD;  Location: [facility] CATH [facility];  Service: Cardiovascular;  Laterality: N/A;  . Tonsillectomy    . Abdominal hysterectomy  1970's  . Coronary angioplasty with stent placement  12/2007    "2"  . Cardiac catheterization  02/2011   Social History: History  Substance Use Topics  . Smoking status: Former Smoker -- 1.00 packs/day for 50 years    Types: Cigarettes    Quit date: 01/10/2013  . Smokeless tobacco: Never Used  . Alcohol Use: No   Additional social history: none  Please also refer to relevant sections of EMR.  Family History: Family History  Problem Relation Age of Onset  . Heart disease Maternal Grandmother   . Brain cancer Brother   . Lung cancer Mother     smoked   Allergies and Medications: Allergies  Allergen Reactions  . Doxycycline Anaphylaxis  . Codeine Other (See Comments)    hyperactivity  . Dulera [Mometasone Furo-Formoterol Fum] Hives and Rash  . Spiriva Handihaler [Tiotropium Bromide Monohydrate] Rash and Other (See Comments)    Rash and Urinary Retention  . Adhesive [Tape] Rash  . Latex Other (See Comments)    unknown  . Lisinopril Cough   Current Facility-Administered Medications on File Prior to Encounter  Medication Dose Route Frequency Provider Last Rate Last Dose  . albuterol (PROVENTIL) (2.5 MG/3ML) 0.083% nebulizer solution 2.5 mg  2.5 mg Nebulization Once [name], MD      . cyanocobalamin ((VITAMIN B-12)) injection 1,000 mcg  1,000 mcg Intramuscular Q30 days Dorian Heckle English, PA   1,000 mcg at 02/22/14 1029  . ipratropium (ATROVENT) nebulizer solution 0.5 mg  0.5 mg Nebulization Once [name], MD       Current Outpatient Prescriptions on File Prior to Encounter   Medication Sig Dispense Refill  . albuterol (PROAIR HFA) 108 (90 BASE) MCG/ACT inhaler Inhale 2 puffs into the lungs every 4 (four) hours as needed for wheezing or shortness of breath. 1 Inhaler 11  . albuterol (PROVENTIL) (2.5 MG/3ML) 0.083% nebulizer solution Take 3 mLs (2.5 mg total) by nebulization every 4 (four) hours as needed for wheezing. 150 mL 11  . ALPRAZolam (XANAX) 0.25 MG tablet Take 1 tablet (0.25 mg total) by mouth 2 (two) times daily as needed for anxiety. 20 tablet 2  . clopidogrel (PLAVIX) 75 MG tablet Take 1 tablet (75 mg total) by mouth every morning. 30 tablet 11  . Fluticasone-Salmeterol (ADVAIR) 500-50 MCG/DOSE AEPB Inhale 1 puff into the lungs 2 (two) times daily. 180 each 11  . guaiFENesin (MUCINEX) 600 MG 12 hr tablet Take 600 mg by mouth daily. Take every day per patient    . Iron TABS Take 1 tablet by mouth daily.    . metFORMIN (GLUCOPHAGE) 500 MG tablet Take 1 tablet (500 mg total) by mouth 2 (two) times daily with a meal. 180 tablet 2  . montelukast (SINGULAIR) 10 MG tablet take 1  tablet by mouth once daily (Patient taking differently: take 1 tablet by mouth once every evening) 30 tablet 5  . nitroGLYCERIN (NITROSTAT) 0.4 MG SL tablet Place 1 tablet (0.4 mg total) under the tongue every 5 (five) minutes x 3 doses as needed for chest pain. 25 tablet 1  . pravastatin (PRAVACHOL) 80 MG tablet Take 1 tablet (80 mg total) by mouth every evening. 30 tablet 11  . valsartan (DIOVAN) 80 MG tablet Take 1 tablet (80 mg total) by mouth daily. (Patient taking differently: Take 80 mg by mouth every evening. ) 30 tablet 5  . bisoprolol (ZEBETA) 5 MG tablet Take 1 tablet (5 mg total) by mouth daily. (Patient not taking: Reported on 08/17/2014) 30 tablet 0    Objective: BP 126/53 mmHg  Pulse 110  Temp(Src) 98.6 F (37 C) (Oral)  Resp 21  SpO2 99% Exam: General: NAD, able to speak in full sentences Eyes: PERRL, EOMI, sclera anicteric ENTM: MMM, no oropharyngeal lesions Neck:  supple, no thyromegaly noted Cardiovascular: tachycardic, regular rhythm, no murmur appreciated. 2+ radial, DP pulses bilaterally Respiratory: decreased air movement bilaterally, scattered wheezes, moderately increased work of breathing Abdomen: thin, soft, nontender, nondistended, normal bowel sounds MSK: thin muscle bulk, normal tone, no deformities Skin: no rashes Neuro: alert and oriented, no focal deficits Psych: normal thought content and speech.  Labs and Imaging: CBC BMET   Recent Labs Lab 09/07/14 1748  WBC 8.4  HGB 12.0  HCT 37.0  PLT 223    Recent Labs Lab 09/07/14 1748  NA 137  K 4.3  CL 97*  CO2 30  BUN 13  CREATININE 0.51  GLUCOSE 346*  CALCIUM 9.4     Dg Chest 1 View  09/06/2014   CLINICAL DATA:  Trouble breathing for 3 days.  EXAM: CHEST  1 VIEW  COMPARISON:  Radiographs 08/13/2014  FINDINGS: Normal cardiac silhouette. Lungs are hyperinflated. There is biapical pulmonary scarring unchanged from prior. No pneumothorax. No pulmonary edema. No infiltrate.  IMPRESSION: Hyper inflated lungs with apical scarring.  No acute findings.   Electronically Signed   By: Suzy Bouchard M.D.   On: 09/06/2014 18:25    Leone Brand, MD 09/07/2014, 6:51 PM PGY-3, [place] [facility] pager: [phone], text pages welcome

## 2014-09-07 NOTE — Progress Notes (Signed)
This chart was scribed for Judith Queen, MD by Marti Sleigh, Medical Scribe. This patient was seen in Room 22 and the patient's care was started at 3:58 PM.  Chief Complaint:  Chief Complaint  Patient presents with   follow up from ER    last night...breathing treatment and CXR    HPI: Judith Boyer is a 69 y.o. female with a hx of end-stage COPD who reports to [facility] today for a COPD follow up. Pt reported to the ER yesterday due to worsening SOB. She was given a duoneb and prednisone in the ER and told to follow up with Dr. Everlene Farrier. Pt states she [facility] access [facility], but she is not sure how they will be able to help her. Pt was off steroids for one week before reporting to the ER. Pt's breathing sx steadily worsened as soon as she went down on her steroids.   Pt states she may be interested in moving over to an assisted living facility.  Past Medical History  Diagnosis Date   COPD (chronic obstructive pulmonary disease)    Hypertension    Coronary artery disease    Shortness of breath    High cholesterol    Myocardial infarction 02/2011   On home oxygen therapy     "2L; 24h/day" (02/08/2014)   Pneumonia     "couple times" (02/08/2014)   Type II diabetes mellitus     type 2   Anemia    History of blood transfusion 2014    "extremely anemic"   Past Surgical History  Procedure Laterality Date   Shoulder arthroscopy w/ rotator cuff repair Left    Left heart catheterization with coronary angiogram N/A 02/09/2011    Procedure: LEFT HEART CATHETERIZATION WITH CORONARY ANGIOGRAM;  Surgeon: Birdie Riddle, MD;  Location: [facility] CATH [facility];  Service: Cardiovascular;  Laterality: N/A;   Tonsillectomy     Abdominal hysterectomy  1970's   Coronary angioplasty with stent placement  12/2007    "2"   Cardiac catheterization  02/2011   History   Social History   Marital Status: Widowed    Spouse Name: N/A   Number of Children: N/A   Years of Education:  N/A   Social History Main Topics   Smoking status: Former Smoker -- 1.00 packs/day for 50 years    Types: Cigarettes    Quit date: 01/10/2013   Smokeless tobacco: Never Used   Alcohol Use: No   Drug Use: No   Sexual Activity: No   Other Topics Concern   None   Social History Narrative   Family History  Problem Relation Age of Onset   Heart disease Maternal Grandmother    Brain cancer Brother    Lung cancer Mother     smoked   Allergies  Allergen Reactions   Doxycycline Anaphylaxis   Codeine Other (See Comments)    hyperactivity   Dulera [Mometasone Furo-Formoterol Fum] Hives and Rash   Spiriva Handihaler [Tiotropium Bromide Monohydrate] Rash and Other (See Comments)    Rash and Urinary Retention   Adhesive [Tape] Rash   Latex Other (See Comments)    unknown   Lisinopril Cough   Prior to Admission medications   Medication Sig Start Date End Date Taking? Authorizing Provider  albuterol (PROAIR HFA) 108 (90 BASE) MCG/ACT inhaler Inhale 2 puffs into the lungs every 4 (four) hours as needed for wheezing or shortness of breath. 08/17/14  Yes [name], MD  albuterol ([place]) (  2.5 MG/3ML) 0.083% nebulizer solution Take 3 mLs (2.5 mg total) by nebulization every 4 (four) hours as needed for wheezing. 01/14/14  Yes [name], MD  ALPRAZolam Duanne Moron) 0.25 MG tablet Take 1 tablet (0.25 mg total) by mouth 2 (two) times daily as needed for anxiety. 08/17/14  Yes [name], MD  clopidogrel (PLAVIX) 75 MG tablet Take 1 tablet (75 mg total) by mouth every morning. 06/28/13  Yes [name], MD  Fluticasone-Salmeterol (ADVAIR) 500-50 MCG/DOSE AEPB Inhale 1 puff into the lungs 2 (two) times daily. 01/14/14  Yes [name], MD  guaiFENesin (MUCINEX) 600 MG 12 hr tablet Take 600 mg by mouth daily.   Yes Historical Provider, MD  Iron TABS Take 1 tablet by mouth daily.   Yes Historical Provider, MD  metFORMIN (GLUCOPHAGE) 500 MG tablet Take 1 tablet (500 mg  total) by mouth 2 (two) times daily with a meal. 07/24/14  Yes [name], MD  montelukast (SINGULAIR) 10 MG tablet take 1 tablet by mouth once daily Patient taking differently: take 1 tablet by mouth once every evening 07/09/14  Yes [name], MD  nitroGLYCERIN (NITROSTAT) 0.4 MG SL tablet Place 1 tablet (0.4 mg total) under the tongue every 5 (five) minutes x 3 doses as needed for chest pain. 02/07/12  Yes Dixie Dials, MD  pravastatin (PRAVACHOL) 80 MG tablet Take 1 tablet (80 mg total) by mouth every evening. 06/28/13  Yes [name], MD  valsartan (DIOVAN) 80 MG tablet Take 1 tablet (80 mg total) by mouth daily. Patient taking differently: Take 80 mg by mouth every evening.  02/22/14  Yes Dorian Heckle English, PA  bisoprolol (ZEBETA) 5 MG tablet Take 1 tablet (5 mg total) by mouth daily. Patient not taking: Reported on 08/17/2014 06/21/14   Aquilla Hacker, MD     ROS: The patient denies fevers, chills, night sweats, unintentional weight loss, chest pain, palpitations, nausea, vomiting, abdominal pain, dysuria, hematuria, melena, numbness, or tingling.   Endorses constant, chronic SOB.  All other systems have been reviewed and were otherwise negative with the exception of those mentioned in the HPI and as above.    PHYSICAL EXAM: Filed Vitals:   09/07/14 1553  BP: 133/67  Pulse: 119  Temp: 97.1 F (36.2 C)  Resp: 22   Body mass index is 17.66 kg/(m^2).   General: Sitting, breathing rapidly with pursed lips. Body in a forward position.  HEENT:  Normocephalic, atraumatic, oropharynx patent. Eye: [name] Cardiovascular: Tachycardia without murmurs.  Respiratory: Very limited breath sounds in both bases. Significantly increased A-P diameter.  Abdominal: No organomegaly, abdomen is soft and non-tender, positive bowel sounds.  No masses. Musculoskeletal: Gait intact. No edema, tenderness Skin: No rashes. Neurologic: Facial musculature symmetric. Psychiatric: Patient acts  appropriately throughout our interaction. Lymphatic: No cervical or submandibular lymphadenopathy Genitourinary/Anorectal: No acute findings   LABS: Results for orders placed or performed during the hospital encounter of 09/06/14  CBC  Result Value Ref Range   WBC 7.1 4.0 - 10.5 K/uL   RBC 3.98 3.87 - 5.11 MIL/uL   Hemoglobin 12.1 12.0 - 15.0 g/dL   HCT 37.5 36.0 - 46.0 %   MCV 94.2 78.0 - 100.0 fL   MCH 30.4 26.0 - 34.0 pg   MCHC 32.3 30.0 - 36.0 g/dL   RDW 12.6 11.5 - 15.5 %   Platelets 213 150 - 400 K/uL  Comprehensive metabolic panel  Result Value Ref Range   Sodium  138 135 - 145 mmol/L   Potassium 3.5 3.5 - 5.1 mmol/L   Chloride 100 (L) 101 - 111 mmol/L   CO2 32 22 - 32 mmol/L   Glucose, Bld 214 (H) 65 - 99 mg/dL   BUN 7 6 - 20 mg/dL   Creatinine, Ser 0.43 (L) 0.44 - 1.00 mg/dL   Calcium 8.6 (L) 8.9 - 10.3 mg/dL   Total Protein 5.6 (L) 6.5 - 8.1 g/dL   Albumin 3.3 (L) 3.5 - 5.0 g/dL   AST 22 15 - 41 U/L   ALT 25 14 - 54 U/L   Alkaline Phosphatase 66 38 - 126 U/L   Total Bilirubin 0.3 0.3 - 1.2 mg/dL   GFR calc non Af Amer >60 >60 mL/min   GFR calc Af Amer >60 >60 mL/min   Anion gap 6 5 - 15  I-stat troponin, ED (not at [facility])  Result Value Ref Range   Troponin i, poc 0.00 0.00 - 0.08 ng/mL   Comment 3             EKG/XRAY:   Primary read interpreted by Dr. [facility]lene Farrier at [facility].   ASSESSMENT/PLAN: Patient with COPD gold 3 recent worsening since being off prednisone. She was seen in the emergency room last night given IV Solu-Medrol. Single view chest x-ray showed COPD. This morning her sugar was greater than 400. She was discharged on prednisone 60 mg a day. I am concerned she will not be able to manage her hyperglycemia at home with being on the increased dose of sterile age. I did speak with Dr. Francoise Ceo  on call for family medicine. I also reviewed case with the charge nurse. She was given 1 DuoNeb here prior to transfer. This causes extreme fatigue with the treatment.  Patient currently living at home with her son next door. I'm not sure how long this will continue. At the present time she is a full code.   Gross sideeffects, risk and benefits, and alternatives of medications d/w patient. Patient is aware that all medications have potential sideeffects and we are unable to predict every sideeffect or drug-drug interaction that may occur.  Judith Queen MD 09/07/2014 3:58 PM

## 2014-09-07 NOTE — Progress Notes (Deleted)
   Subjective:    Patient ID: Judith Boyer, female    DOB: 1945-12-26, 69 y.o.   MRN: [id number]  HPI    Review of Systems     Objective:   Physical Exam        Assessment & Plan:

## 2014-09-08 LAB — TROPONIN I

## 2014-09-08 LAB — BLOOD GAS, ARTERIAL
Acid-Base Excess: 5.1 mmol/L — ABNORMAL HIGH (ref 0.0–2.0)
BICARBONATE: 29.9 meq/L — AB (ref 20.0–24.0)
DRAWN BY: 276051
O2 Content: 2 L/min
O2 Saturation: 98.9 %
PATIENT TEMPERATURE: 98.6
TCO2: 31.5 mmol/L (ref 0–100)
pCO2 arterial: 50.6 mmHg — ABNORMAL HIGH (ref 35.0–45.0)
pH, Arterial: 7.389 (ref 7.350–7.450)
pO2, Arterial: 115 mmHg — ABNORMAL HIGH (ref 80.0–100.0)

## 2014-09-08 LAB — GLUCOSE, CAPILLARY
GLUCOSE-CAPILLARY: 352 mg/dL — AB (ref 65–99)
Glucose-Capillary: 128 mg/dL — ABNORMAL HIGH (ref 65–99)
Glucose-Capillary: 123 mg/dL — ABNORMAL HIGH (ref 65–99)
Glucose-Capillary: 218 mg/dL — ABNORMAL HIGH (ref 65–99)
Glucose-Capillary: 156 mg/dL — ABNORMAL HIGH (ref 65–99)

## 2014-09-08 LAB — MRSA PCR SCREENING: MRSA by PCR: NEGATIVE

## 2014-09-08 MED ORDER — ALBUTEROL SULFATE (2.5 MG/3ML) 0.083% IN NEBU
2.5000 mg | INHALATION_SOLUTION | RESPIRATORY_TRACT | Status: DC | PRN
Start: 2014-09-08 — End: 2014-09-14
  Administered 2014-09-09 – 2014-09-12 (×5): 2.5 mg via RESPIRATORY_TRACT
  Filled 2014-09-08 (×6): qty 3

## 2014-09-08 MED ORDER — INSULIN ASPART 100 UNIT/ML ~~LOC~~ SOLN
0.0000 [IU] | Freq: Three times a day (TID) | SUBCUTANEOUS | Status: DC
  Administered 2014-09-08: 9 [IU] via SUBCUTANEOUS
  Administered 2014-09-08: 1 [IU] via SUBCUTANEOUS
  Administered 2014-09-08: 3 [IU] via SUBCUTANEOUS
  Administered 2014-09-09: 2 [IU] via SUBCUTANEOUS
  Administered 2014-09-09: 5 [IU] via SUBCUTANEOUS
  Administered 2014-09-09: 7 [IU] via SUBCUTANEOUS
  Administered 2014-09-10: 3 [IU] via SUBCUTANEOUS
  Administered 2014-09-10: 7 [IU] via SUBCUTANEOUS
  Administered 2014-09-10: 3 [IU] via SUBCUTANEOUS
  Administered 2014-09-11: 1 [IU] via SUBCUTANEOUS
  Administered 2014-09-11: 2 [IU] via SUBCUTANEOUS
  Administered 2014-09-11: 7 [IU] via SUBCUTANEOUS
  Administered 2014-09-12: 5 [IU] via SUBCUTANEOUS
  Administered 2014-09-13 (×2): 3 [IU] via SUBCUTANEOUS
  Administered 2014-09-14: 2 [IU] via SUBCUTANEOUS

## 2014-09-08 MED ORDER — INSULIN ASPART 100 UNIT/ML ~~LOC~~ SOLN
0.0000 [IU] | Freq: Every day | SUBCUTANEOUS | Status: DC
  Administered 2014-09-08 – 2014-09-09 (×2): 4 [IU] via SUBCUTANEOUS
  Administered 2014-09-10 – 2014-09-11 (×2): 2 [IU] via SUBCUTANEOUS

## 2014-09-08 MED ORDER — ALPRAZOLAM 0.25 MG PO TABS
0.2500 mg | ORAL_TABLET | Freq: Once | ORAL | Status: AC
  Administered 2014-09-08: 0.25 mg via ORAL
  Filled 2014-09-08: qty 1

## 2014-09-08 NOTE — Progress Notes (Addendum)
**  Interval Note**  Evaluated patient this evening.  She reports that WOB is stable.  She reports that food feels like it is getting stuck and coming back up.  Reports that she has had these episodes in the past.  She reports when these episodes occur, she has L shoulder pain.  She denies CP, nausea, abdominal pain.  On exam, patient is sitting up in bed having dinner.  Cup at bedside with moderate amount of sputum in it.  She is non diaphoretic.  Tachycardic but regular.  Slight tachypnea, no tripoding, no accessory muscle use, no wheeze or rhonchi, globally decreased air movement, [place] in place.  Abdominal exam unremarkable.  NT/ND, +BS, no peritoneal signs, no palpable masses.  Discussed with granddaughters and patient plan for transfer to SDU for closer monitoring.  Reiterated that she should use BiPAP.  She voiced some anxiety about having it on for prolonged periods of time.  A/P COPD exacerbation.  Patient experiencing intermittent shoulder pain.  In the setting of SOB, will evaluate for possible cardiac ischemia as well. -transfer to SDU -Bipap PRN increased WOB -will give small dose of ativan if needed for anxiety -continue duonebs  -Obtain EKG and cycle troponins -Speech evaluation -Will FYI CCM, as patient's work of breathing is tenuous  -Will continue to monitor  [employer]. Lajuana Ripple, DO PGY-2, [place]

## 2014-09-08 NOTE — Progress Notes (Addendum)
Addendum:  Reevaluated patient. Respiratory status improved, but patient's breathing is still labored. She is still using accessory muscles, breathing through pursed lips, and is not able to speak in full sentences. She is amenable to trying BiPAP if necessary. ABG shows that patient is retaining some CO2, but is not yet fully acidotic. Will reassess in one hour to determine need for BiPAP.    ________________________________________________________________________________________________    FPTS Interim Progress Note  S: Patient has very increased work of breathing upon my exam this afternoon. She is breathing through pursed lips, cannot speak in full sentences, is using accessory muscles, and leaning forward in tripod position. She has not been requesting her q2 PRN albuterol nebs until now, but has been receiving q4 scheduled duonebs. She says she has tried BiPAP in the past and felt very claustrophobic.   O: BP 134/55 mmHg  Pulse 123  Temp(Src) 98.1 F (36.7 C) (Oral)  Resp 28  Ht 5\' 1"  (1.549 m)  Wt 98 lb (44.453 kg)  BMI 18.53 kg/m2  SpO2 97%  General: elderly very thin lady in tripod position with very increased work of breathing Pulm: using accessory muscles, tachypneac, on 2L [place], poor air movement on auscultation  A/P:  COPD exacerbation - - One time dose Xanax - patient very anxious about breathing difficulties  - ABG - Transfer to SDU - Continue duoneb q4 and PRN albuterol neb q2 - Consider starting BiPAP if no improvement in resp status - patient currently refusing  - Will reevaluate in 30 minutes  Verner Mould, MD 09/08/2014, 4:21 PM PGY-1, [place] [facility] Service pager [phone]

## 2014-09-08 NOTE — Progress Notes (Signed)
  Pt admitted to the unit. Pt is stable, alert and oriented per baseline. Oriented to room, staff, and call bell. Educated to call for any assistance. Bed in lowest position, call bell within reach- will continue to monitor. 

## 2014-09-08 NOTE — Progress Notes (Signed)
Family Medicine Teaching Service Daily Progress Note Intern Pager: [phone]  Patient name: Judith Boyer Medical record number: [id number] Date of birth: November 23, 1945 Age: 69 y.o. Gender: female  Primary Care Provider: Jenny Reichmann, MD Consultants: none Code Status: Full  Chief Complaint: short of breath  Assessment and Plan: Judith Boyer is a 69 y.o. female presenting with shortness of breath. PMH is significant for COPD with recent exacerbation requiring hospitalization in July, HTN, CAD, HLD, T2DM  # COPD exacerbation: on home 2L O2. Recurrent exacerbation. Saturating 96-100% on 2L [place]. Vitals also with tachycardia, tachypnea. She has never been intubated in the past for COPD issues. Last hospitalization received azithromycin, has allergy to doxycycline.  - duonebs q4hrs scheduled - albuterol neb q2 hrs PRN - prednisone 60mg  daily - levaquin 500mg  daily - continuous O2 keep sat >92% - continue home advair - monitor respiratory status closely  # Tachycardia: sinus on EKG, suspected from albuterol treatments - Improving, but consider repeat EKG if worsens  # T2DM: CBG elevated in 300s, 400s, but no evidence of DKA/HONK.  - add SSI (thin, adjust as needed) - CBG qAC qHS  # HTN: BP 120-150s/50-60s. - will hold bisoprolol for now - hospital formulary equivalent for valsartan  # Anxiety: stable - continue home xanax  # CAD: stable - continue home plavix  # HLD: stable - continue home pravastatin (has intolerances to atorva)  # Constipation: - Begin Senna   # Anemia: on iron tabs at home - D/c home iron given current levoquin treatment   FEN/GI: diet carb mod / saline lock Prophylaxis: heparin   Disposition: home pending medical improvement   Subjective:  Patient reports that she is still having some difficulty breathing between q4 duoneb treatments. I reminded her that she has q2 PRN treatments as well. She denied other complaints.    Objective: Temp:  [97.1 F (36.2 C)-98.6 F (37 C)] 98.1 F (36.7 C) (08/03 0538) Pulse Rate:  [108-123] 108 (08/03 0538) Resp:  [21-32] 28 (08/03 0538) BP: (121-157)/(44-67) 121/44 mmHg (08/03 0538) SpO2:  [96 %-100 %] 99 % (08/03 0939) Weight:  [95 lb (43.092 kg)-98 lb (44.453 kg)] 98 lb (44.453 kg) (08/02 2230) Physical Exam: General: elderly lady sitting on side of bed eating breakfast breathing through pursed lips Cardiovascular: RRR, no murmurs appreciated Respiratory: breathing through pursed lips, no wheezes/crackles on auscultation Abdomen: soft, non-tender, non-distended Neuro: A&Ox3, no focal deficits Psych: appropriate mood and affect   Laboratory:  Recent Labs Lab 09/06/14 1830 09/07/14 1748  WBC 7.1 8.4  HGB 12.1 12.0  HCT 37.5 37.0  PLT 213 223    Recent Labs Lab 09/06/14 1830 09/07/14 1748  NA 138 137  K 3.5 4.3  CL 100* 97*  CO2 32 30  BUN 7 13  CREATININE 0.43* 0.51  CALCIUM 8.6* 9.4  PROT 5.6*  --   BILITOT 0.3  --   ALKPHOS 66  --   ALT 25  --   AST 22  --   GLUCOSE 214* 346*    Imaging/Diagnostic Tests: Dg Chest 1 View  09/06/2014   CLINICAL DATA:  Trouble breathing for 3 days.  EXAM: CHEST  1 VIEW  COMPARISON:  Radiographs 08/13/2014  FINDINGS: Normal cardiac silhouette. Lungs are hyperinflated. There is biapical pulmonary scarring unchanged from prior. No pneumothorax. No pulmonary edema. No infiltrate.  IMPRESSION: Hyper inflated lungs with apical scarring.  No acute findings.   Electronically Signed   By: Helane Gunther.D.  On: 09/06/2014 18:25    Verner Mould, MD 09/08/2014, 12:24 PM PGY-1, [place] Intern pager: [phone], text pages welcome

## 2014-09-08 NOTE — Care Management Note (Signed)
Case Management Note  Patient Details  Name: MARLEAH BEEVER MRN: [id number] Date of Birth: 1945-07-14  Subjective/Objective:                 Patient from home; self care. Her son lives next door.  Has oxygen supplied by [employer] and a home nebulizer. Patient concerned about being discharged on new asthma medication that may be expensive. Patient denies any difficulties to obtaining care.    Action/Plan:  Anticipate discharge to home. Will continue to follow medications and offer assistance as possible. Expected Discharge Date:  09/11/14               Expected Discharge Plan:  Home/Self Care  In-House Referral:     Discharge planning Services  CM Consult  Post Acute Care Choice:    Choice offered to:     DME Arranged:    DME Agency:     HH Arranged:    HH Agency:     Status of Service:  In process, will continue to follow  Medicare Important Message Given:    Date Medicare IM Given:    Medicare IM give by:    Date Additional Medicare IM Given:    Additional Medicare Important Message give by:     If discussed at [place] of Stay Meetings, dates discussed:    Additional Comments:  Carles Collet, RN 09/08/2014, 2:10 PM

## 2014-09-08 NOTE — Progress Notes (Signed)
Spoke with patient after transfer to CCM. She was tachycardic to the 100s but denied sensation of palpitations. She stated that her pulse can normally get up to the 120s with albuterol. She was breathing comfortably on 2L O2 by [place] (RR: 17, SpO2 99-100%). Aware of plan to use BiPAP if she has respiratory distress. She denied any pain and was eager to have her bedside swallow test in order to have something to eat and drink.   Olene Floss, MD PGY-1, [place]

## 2014-09-09 ENCOUNTER — Inpatient Hospital Stay (HOSPITAL_COMMUNITY): Payer: Medicare Other

## 2014-09-09 DIAGNOSIS — R1310 Dysphagia, unspecified: Secondary | ICD-10-CM | POA: Insufficient documentation

## 2014-09-09 DIAGNOSIS — R000 Tachycardia, unspecified: Secondary | ICD-10-CM

## 2014-09-09 LAB — GLUCOSE, CAPILLARY
Glucose-Capillary: 166 mg/dL — ABNORMAL HIGH (ref 65–99)
Glucose-Capillary: 298 mg/dL — ABNORMAL HIGH (ref 65–99)
Glucose-Capillary: 318 mg/dL — ABNORMAL HIGH (ref 65–99)
Glucose-Capillary: 314 mg/dL — ABNORMAL HIGH (ref 65–99)

## 2014-09-09 LAB — BASIC METABOLIC PANEL
Anion gap: 6 (ref 5–15)
BUN: 15 mg/dL (ref 6–20)
CHLORIDE: 100 mmol/L — AB (ref 101–111)
CO2: 31 mmol/L (ref 22–32)
Calcium: 8.8 mg/dL — ABNORMAL LOW (ref 8.9–10.3)
Creatinine, Ser: 0.48 mg/dL (ref 0.44–1.00)
GFR calc Af Amer: >60 mL/min (ref >60)
GFR calc non Af Amer: >60 mL/min (ref >60)
GLUCOSE: 206 mg/dL — AB (ref 65–99)
Potassium: 3.9 mmol/L (ref 3.5–5.1)
Sodium: 137 mmol/L (ref 135–145)

## 2014-09-09 MED ORDER — ALPRAZOLAM 0.25 MG PO TABS
0.2500 mg | ORAL_TABLET | Freq: Three times a day (TID) | ORAL | Status: DC | PRN
  Administered 2014-09-09 – 2014-09-13 (×6): 0.25 mg via ORAL
  Filled 2014-09-09 (×6): qty 1

## 2014-09-09 MED ORDER — INSULIN ASPART 100 UNIT/ML ~~LOC~~ SOLN
4.0000 [IU] | Freq: Three times a day (TID) | SUBCUTANEOUS | Status: DC
Start: 2014-09-09 — End: 2014-09-14
  Administered 2014-09-09 – 2014-09-14 (×14): 4 [IU] via SUBCUTANEOUS

## 2014-09-09 MED ORDER — LEVOFLOXACIN 750 MG PO TABS
750.0000 mg | ORAL_TABLET | ORAL | Status: DC
  Administered 2014-09-10 – 2014-09-14 (×3): 750 mg via ORAL
  Filled 2014-09-09 (×3): qty 1

## 2014-09-09 NOTE — Progress Notes (Signed)
Inpatient Diabetes Program Recommendations  AACE/ADA: New Consensus Statement on Inpatient Glycemic Control (2013)  Target Ranges:  Prepandial:   less than 140 mg/dL      Peak postprandial:   less than 180 mg/dL (1-2 hours)      Critically ill patients:  140 - 180 mg/dL  Results for Judith Boyer, Judith Boyer (MRN [id number]) as of 09/09/2014 10:06  Ref. Range 09/08/2014 07:46 09/08/2014 11:17 09/08/2014 16:18 09/08/2014 19:30 09/09/2014 08:58  Glucose-Capillary Latest Ref Range: 65-99 mg/dL 123 (H) 218 (H) 352 (H) 156 (H) 166 (H)    Note CBG's increased with PO Prednisone.  Consider adding Novolog 4 units tid with meals (to cover CHO intake) while on PO Prednisone.  Thanks, Adah Perl, RN, BC-ADM Inpatient Diabetes Coordinator Pager [phone] (8a-5p)

## 2014-09-09 NOTE — Evaluation (Signed)
Clinical/Bedside Swallow Evaluation Patient Details  Name: Judith Boyer MRN: [id number] Date of Birth: 1945-04-24  Today's Date: 09/09/2014 Time: SLP Start Time (ACUTE ONLY): 0955 SLP Stop Time (ACUTE ONLY): 1010 SLP Time Calculation (min) (ACUTE ONLY): 15 min  Past Medical History:  Past Medical History  Diagnosis Date  . COPD (chronic obstructive pulmonary disease)   . Hypertension   . Coronary artery disease   . Shortness of breath   . High cholesterol   . Myocardial infarction 02/2011  . On home oxygen therapy     "2L; 24h/day" (02/08/2014)  . Pneumonia     "couple times" (02/08/2014)  . Type II diabetes mellitus     type 2  . Anemia   . History of blood transfusion 2014    "extremely anemic"   Past Surgical History:  Past Surgical History  Procedure Laterality Date  . Shoulder arthroscopy w/ rotator cuff repair Left   . Left heart catheterization with coronary angiogram N/A 02/09/2011    Procedure: LEFT HEART CATHETERIZATION WITH CORONARY ANGIOGRAM;  Surgeon: Birdie Riddle, MD;  Location: [facility] CATH LAB;  Service: Cardiovascular;  Laterality: N/A;  . Tonsillectomy    . Abdominal hysterectomy  1970's  . Coronary angioplasty with stent placement  12/2007    "2"  . Cardiac catheterization  02/2011   HPI:  Judith Boyer is a 69 y.o. female presenting with shortness of breath. PMH is significant for COPD with recent exacerbation requiring hospitalization in July, HTN, CAD, HLD, T2DM. She reports that food feels like it is getting stuck and coming back up.    Assessment / Plan / Recommendation Clinical Impression  Pt demonstrates normal oral and oropharyngeal function. Her complaints of globus and regurgitation with meals are typically consistent with a primary esophageal dysphagia. She reports that these symptoms have worsened in the recent past. Suggest an esophagram to evaluate esophageal function. In the meantime pt can continue her current diet with esophageal  precautions, discussed with pt. SLP will follow chart for assessment results.     Aspiration Risk  Mild    Diet Recommendation Age appropriate regular solids;Thin   Medication Administration: Whole meds with puree Compensations: Small sips/bites;Follow solids with liquid;Slow rate    Other  Recommendations Recommended Consults: Consider esophageal assessment Oral Care Recommendations: Oral care BID   Follow Up Recommendations       Frequency and Duration min 1 x/week  1 week   Pertinent Vitals/Pain NA    SLP Swallow Goals     Swallow Study Prior Functional Status       General Other Pertinent Information: Judith Boyer is a 69 y.o. female presenting with shortness of breath. PMH is significant for COPD with recent exacerbation requiring hospitalization in July, HTN, CAD, HLD, T2DM. She reports that food feels like it is getting stuck and coming back up.  Type of Study: Bedside swallow evaluation Previous Swallow Assessment: none Diet Prior to this Study: Regular;Thin liquids Temperature Spikes Noted: No Respiratory Status: Room air History of Recent Intubation: No Behavior/Cognition: Alert;Cooperative;Pleasant mood Oral Cavity - Dentition: Adequate natural dentition/normal for age Self-Feeding Abilities: Able to feed self Patient Positioning: Upright in chair/Tumbleform Baseline Vocal Quality: Normal Volitional Cough: Strong Volitional Swallow: Able to elicit    Oral/Motor/Sensory Function Overall Oral Motor/Sensory Function: Appears within functional limits for tasks assessed   Ice Chips     Thin Liquid Thin Liquid: Within functional limits Presentation: Cup;Straw;Self Fed    Nectar Thick Nectar Thick Liquid: Not  tested   Honey Thick Honey Thick Liquid: Not tested   Puree Puree: Within functional limits   Solid   GO    Solid: Within functional limits      [facility], MA CCC-SLP [phone]  Lynann Beaver 09/09/2014,12:15 PM

## 2014-09-09 NOTE — Progress Notes (Signed)
[facility]  Patient Status: Active (receiving services up to time of hospitalization)  [facility] is providing the following services: RN  If patient discharges after hours, please call [phone].   Edwinna Areola 09/09/2014, 9:34 AM

## 2014-09-09 NOTE — Discharge Summary (Signed)
[facility] Discharge Summary  Patient name: Judith Boyer Medical record number: [id number] Date of birth: 28-Nov-1945 Age: 69 y.o. Gender: female Date of Admission: 09/07/2014  Date of Discharge: 09/14/14 Admitting Physician: Zenia Resides, MD  Primary Care Provider: Jenny Reichmann, MD Consultants: CCM, palliative  Indication for Hospitalization: shortness of breath  Discharge Diagnoses/Problem List:  Patient Active Problem List   Diagnosis Date Noted  . Acute on chronic respiratory failure with hypoxia   . Palliative care by specialist   . Dysphagia   . Dyspnea 08/13/2014  . Tachycardia   . Respiratory acidosis   . Coronary artery disease involving native coronary artery of native heart without angina pectoris   . Type 2 diabetes mellitus without complication   . Anxiety   . COPD with acute exacerbation 06/18/2014  . B12 deficiency 02/10/2014  . Protein-calorie malnutrition, severe 02/09/2014  . Acute respiratory distress 02/08/2014  . Vitamin B 12 deficiency 06/28/2013  . Palliative care encounter 02/25/2013  . Weakness generalized 02/25/2013  . COPD exacerbation 02/20/2013  . CAP (community acquired pneumonia) 02/20/2013  . Anemia 02/20/2013  . Nocturnal hypoxemia 12/30/2012  . Chest pain at rest 02/09/2011  . CAD in native artery 02/09/2011  . COPD GOLD III 02/06/2011  . Smoker 02/06/2011  . Hypertension 02/06/2011  . Diabetes mellitus type 2, controlled, without complications 22/63/3354     Disposition: home  Discharge Condition: stable  Discharge Exam:  General: elderly lady lying in bed eating breakfast in NAD Cardiovascular: RRR, no murmurs appreciated Respiratory: speaking in full sentences, no wheezing, wearing 2L [place]  Abdomen: soft, non-tender, non-distended, +BS Neuro: A&Ox3, no focal deficits Psych: appropriate mood and affect   Brief Hospital Course:  Patient presented with SOB. She has a PMH of COPD. She was  started on duonebs scheduled q4, albuterol neb q2 PRN, levoquin, supplemental O2 (home dose of 2L [place]) and prednisone for COPD exacerbation. Patient's breathing did not improve with these treatments, and she was transferred to stepdown unit for potential BiPAP. CCM was consulted due to concern for potential intubation. Patient's breathing actually improved without BiPAP.   Patient also experienced L shoulder pain and difficulty swallowing. Given accompanying SOB, troponin and EKG were obtained to r/u cardiac event. Both were unremarkable. Barium swallow performed for dysphagia showed no abnormalities.   Patient still did not improve, so pulmonology was consulted. They recommended an updated COPD medication regimen, which patient will be discharged on.   Given the advanced stage of her disease, palliative care was consulted. Patient was not receptive to palliative's ideas, and would not discuss code status with her family. Palliative suggested repeatedly the use of low-dose morphine for dyspnea symptomatic relief, but the patient was very opposed to this idea.   As patient's breathing was stable and back to her baseline, she was discharged with plans for close pulmonology and PCP follow-up.  Issues for Follow Up:  1. Given need for slow prednisone taper, patient will need changes in diabetes control medication. She was on 500 mg metformin before admission. She was discharged with an increased dose of 1 g metformin BID. Could consider adding one dose glipizide in AM if not adequately controlled on metformin alone.  2. Patient will need extended prednisone taper. Suggest four week taper with pulmonology follow-up. Potentially needs chronic steroids.  3. For COPD, pt was discharged on the following medications: duonebs at least 4 times a day, advair, prednisone 60 mg (to be tapered), daliresp, singulair, and  albuterol inhaler. 4. If patient's dysphagia continues, need outpatient GI appointment for repeat  imaging.  5. Xanax increased to TID. Significantly improves patient's breathing status.  6. Patient was started on low-dose zoloft, but did not feel it was effective, and does not want to try another anti-depressant despite symptoms.  7. Consider low-dose morphine for symptomatic relief dyspnea. Patient was not receptive of this idea while inpatient, but may consider broaching the topic again outpatient if dyspnea worsens.  8. Palliative care tried to discuss code status with patient repeatedly. Given patient's poor prognosis, recommend discussing code status with patient again, especially if family is present.   Significant Procedures: None  Significant Labs and Imaging:  Barium swallow -  Patent esophagus with no explanation for symptoms  Recent Labs Lab 09/07/14 1748  WBC 8.4  HGB 12.0  HCT 37.0  PLT 223    Recent Labs Lab 09/07/14 1748 09/09/14 0210  NA 137 137  K 4.3 3.9  CL 97* 100*  CO2 30 31  GLUCOSE 346* 206*  BUN 13 15  CREATININE 0.51 0.48  CALCIUM 9.4 8.8*    Results/Tests Pending at Time of Discharge: None  Discharge Medications:    Medication List    TAKE these medications        albuterol 108 (90 BASE) MCG/ACT inhaler  Commonly known as:  PROAIR HFA  Inhale 2 puffs into the lungs every 4 (four) hours as needed for wheezing or shortness of breath.     ALPRAZolam 0.25 MG tablet  Commonly known as:  XANAX  Take 1 tablet (0.25 mg total) by mouth 3 (three) times daily as needed for anxiety.     bisoprolol 5 MG tablet  Commonly known as:  ZEBETA  Take 1 tablet (5 mg total) by mouth daily.     clopidogrel 75 MG tablet  Commonly known as:  PLAVIX  Take 1 tablet (75 mg total) by mouth every morning.     Fluticasone-Salmeterol 500-50 MCG/DOSE Aepb  Commonly known as:  ADVAIR  Inhale 1 puff into the lungs 2 (two) times daily.     guaiFENesin 600 MG 12 hr tablet  Commonly known as:  MUCINEX  Take 600 mg by mouth daily. Take every day per patient      ipratropium-albuterol 0.5-2.5 (3) MG/3ML Soln  Commonly known as:  DUONEB  Take 3 mLs by nebulization every 4 (four) hours.     Iron Tabs  Take 1 tablet by mouth daily.     metFORMIN 500 MG tablet  Commonly known as:  GLUCOPHAGE  Take 2 tablets (1,000 mg total) by mouth 2 (two) times daily with a meal.     montelukast 10 MG tablet  Commonly known as:  SINGULAIR  take 1 tablet by mouth once daily     nitroGLYCERIN 0.4 MG SL tablet  Commonly known as:  NITROSTAT  Place 1 tablet (0.4 mg total) under the tongue every 5 (five) minutes x 3 doses as needed for chest pain.     pravastatin 80 MG tablet  Commonly known as:  PRAVACHOL  Take 1 tablet (80 mg total) by mouth every evening.     predniSONE 20 MG tablet  Commonly known as:  DELTASONE  Take 1 tablet (20 mg total) by mouth daily with breakfast.     roflumilast 500 MCG Tabs tablet  Commonly known as:  DALIRESP  Take 1 tablet (500 mcg total) by mouth daily.     valsartan 80 MG tablet  Commonly  known as:  DIOVAN  Take 1 tablet (80 mg total) by mouth daily.        Discharge Instructions: Please refer to Patient Instructions section of EMR for full details.  Patient was counseled important signs and symptoms that should prompt return to medical care, changes in medications, dietary instructions, activity restrictions, and follow up appointments.   Follow-Up Appointments: Follow-up Information    Follow up with DAUB, STEVE A, MD. Schedule an appointment as soon as possible for a visit in 1 week.   Specialty:  Family Medicine   Why:  For hospital follow-up   Contact information:   [place] [address] [phone]       Follow up with Christinia Gully, MD. Schedule an appointment as soon as possible for a visit in 1 week.   Specialty:  Pulmonary Disease   Why:  For hospital follow-up   Contact information:   [address] [place] [address] [phone]       Abigail Joseph Lancaster,  MD 09/14/2014, 2:48 PM PGY-1, [place]

## 2014-09-09 NOTE — Clinical Documentation Improvement (Signed)
Possible Clinical Conditions?  Acute Respiratory Failure Acute on Chronic Respiratory Failure Chronic Respiratory Failure Acute Respiratory Insufficiency Acute Respiratory Insufficiency following surgery or trauma Other Condition Cannot Clinically Determine   Supporting Information: Risk Factors: COPD exacerbation: on home 2L O2  Signs & Symptoms: Per H&P: "She is short of breath this morning despite pred and levoquin. Continues to require frequent nebs:" and "ecreased air movement bilaterally, scattered wheezes, moderately increased work of breathing"  Respiratory rates documented as high as 32 breaths/minute.   Diagnostics: ABG results 09/08/2014 @ 1638 pH 7.389, pCO2 50.6, pO2 115, FiO2 2 lpm, HCO3 29.9 Treatment: Duonebs q4hrs scheduled, q2hrs PRN, prednisone 60mg  daily, levaquin 500mg  daily, continuous O2 keep st >92%, continue home advair" and "Bipap PRN increased WOB. -will give small dose of ativan if needed for anxiety...-will FYI CCM, as patient's work of breathing is tenuous"  Thank you, Carrolyn Meiers, [email or web address] [phone]

## 2014-09-09 NOTE — Progress Notes (Signed)
Patient requesting for po fluids and food,called MD on call, Dr. Lajuana Ripple and clarify about swallowing evaluation ordered. With order to do bedside  swallowing test.Patient passed  And was given some fluids and jello, tolerated well. Patient is satisfied and appears comfortable family at the bedside and updated. Will continue to monitor patient.

## 2014-09-09 NOTE — Care Management Note (Signed)
Case Management Note  Patient Details  Name: Judith Boyer MRN: [id number] Date of Birth: [birth year]  Subjective/Objective:     Prior to this readmission home with [facility] from [facility] and [facility] following.                Action/Plan:   Expected Discharge Date:  09/11/14               Expected Discharge Plan:  [facility]  In-House Referral:     Discharge planning Services  CM Consult  Post Acute Care Choice:    Choice offered to:     DME Arranged:    DME Agency:     HH Arranged:  RN [place] Agency:  [facility]  Status of Service:  In process, will continue to follow  Medicare Important Message Given:    Date Medicare IM Given:    Medicare IM give by:    Date Additional Medicare IM Given:    Additional Medicare Important Message give by:     If discussed at [place] of Stay Meetings, dates discussed:    Additional Comments:  Vergie Living, RN 09/09/2014, 10:51 AM

## 2014-09-09 NOTE — Progress Notes (Signed)
Utilization Review completed. Khalila Buechner RN BSN CM 

## 2014-09-09 NOTE — Progress Notes (Signed)
Family Medicine Teaching Service Daily Progress Note Intern Pager: [phone]  Patient name: Judith Boyer Medical record number: [id number] Date of birth: [birth year] Age: 69 y.o. Gender: female  Primary Care Provider: Jenny Reichmann, MD Consultants: CCM Code Status: Full  Pt Overview and Major Events to Date:  08/03 - transferred to SDU for resp distress/BiPAP  Chief Complaint: shortness of breath  Assessment and Plan: Judith Boyer is a 69 y.o. female presenting with shortness of breath. PMH is significant for COPD with recent exacerbation requiring hospitalization in July, HTN, CAD, HLD, T2DM  # COPD exacerbation: 100% O2 sat on 2L [place]. HR and RR WNL. CCM aware of respiratory difficulties. Did not require BiPAP overnight.  - Cont duonebs q4hrs scheduled, albuterol neb q2 hrs PRN, prednisone 60mg  daily, levaquin 500mg  daily, advair - Continuous O2 keep sat >92% - Monitor respiratory status closely - Low threshold for BiPAP   # Tachycardia: suspected from albuterol treatments. Improved to high 90s. - Monitor  # Shoulder pain: given accompanying SOB, trended trop and EKG. Trop <0.03. Repeat EKG showed sinus rhythm with PVCs.  - Monitor   # T2DM: CBG elevated in 300s, 400s, but no evidence of DKA/HONK. CBG between 123-352 in past 24 hr.  - Add SSI (thin, adjust as needed) - CBG qAC qHS  # HTN: BP 90s-130s/40-50s. - Will hold bisoprolol for now [facility] formulary equivalent for valsartan  # Anxiety: stable - Continue home xanax - Can receive extra dose if necessary for BiPAP trial   # CAD: stable - Continue home plavix  # HLD: stable - Continue home pravastatin (has intolerances to atorva)  # Constipation: - Cont Senna   # Anemia: on iron tabs at home - D/c home iron given current levoquin treatment   FEN/GI: diet carb mod / saline lock Prophylaxis: heparin   Disposition: home with home health  Subjective:  Patient's breathing improved somewhat  overnight. She was transferred to stepdown unit for potential BiPAP, but did not require BiPAP trial.  She appears to be breathing much more easily this morning, and says she feels much better. She denies continued shoulder pain and reports no chest pain.   Objective: Temp:  [97.1 F (36.2 C)-98.1 F (36.7 C)] 98 F (36.7 C) (08/04 1129) Pulse Rate:  [89-123] 102 (08/04 0900) Resp:  [13-30] 30 (08/04 0900) BP: (94-140)/(41-65) 140/50 mmHg (08/04 0900) SpO2:  [96 %-100 %] 100 % (08/04 0900) Physical Exam: General: elderly lady lying in bed in NAD Cardiovascular: RRR, no murmurs appreciated Respiratory: non-labored breathing, on [place], poor air movement on auscultation but no crackles/wheezes Abdomen: soft, non-tender, non-distended, +BS Neuro: A&Ox3, no focal deficits Psych: appropriate mood and affect   Laboratory:  Recent Labs Lab 09/06/14 1830 09/07/14 1748  WBC 7.1 8.4  HGB 12.1 12.0  HCT 37.5 37.0  PLT 213 223    Recent Labs Lab 09/06/14 1830 09/07/14 1748 09/09/14 0210  NA 138 137 137  K 3.5 4.3 3.9  CL 100* 97* 100*  CO2 32 30 31  BUN 7 13 15   CREATININE 0.43* 0.51 0.48  CALCIUM 8.6* 9.4 8.8*  PROT 5.6*  --   --   BILITOT 0.3  --   --   ALKPHOS 66  --   --   ALT 25  --   --   AST 22  --   --   GLUCOSE 214* 346* 206*    Imaging/Diagnostic Tests: Dg Chest 1 View  09/06/2014  CLINICAL DATA:  Trouble breathing for 3 days.  EXAM: CHEST  1 VIEW  COMPARISON:  Radiographs 08/13/2014  FINDINGS: Normal cardiac silhouette. Lungs are hyperinflated. There is biapical pulmonary scarring unchanged from prior. No pneumothorax. No pulmonary edema. No infiltrate.  IMPRESSION: Hyper inflated lungs with apical scarring.  No acute findings.   Electronically Signed   By: Suzy Bouchard M.D.   On: 09/06/2014 18:25    Verner Mould, MD 09/09/2014, 11:46 AM PGY-1, [place] Intern pager: [phone], text pages welcome

## 2014-09-10 DIAGNOSIS — R0600 Dyspnea, unspecified: Secondary | ICD-10-CM

## 2014-09-10 DIAGNOSIS — Z515 Encounter for palliative care: Secondary | ICD-10-CM

## 2014-09-10 DIAGNOSIS — Z789 Other specified health status: Secondary | ICD-10-CM

## 2014-09-10 LAB — GLUCOSE, CAPILLARY
GLUCOSE-CAPILLARY: 215 mg/dL — AB (ref 65–99)
GLUCOSE-CAPILLARY: 341 mg/dL — AB (ref 65–99)

## 2014-09-10 MED ORDER — SERTRALINE HCL 25 MG PO TABS
25.0000 mg | ORAL_TABLET | Freq: Every day | ORAL | Status: DC
  Administered 2014-09-10 – 2014-09-12 (×3): 25 mg via ORAL
  Filled 2014-09-10 (×5): qty 1

## 2014-09-10 MED ORDER — ROFLUMILAST 500 MCG PO TABS
500.0000 ug | ORAL_TABLET | Freq: Every day | ORAL | Status: DC
  Administered 2014-09-10 – 2014-09-14 (×5): 500 ug via ORAL
  Filled 2014-09-10 (×6): qty 1

## 2014-09-10 MED ORDER — INSULIN GLARGINE 100 UNIT/ML ~~LOC~~ SOLN
10.0000 [IU] | Freq: Every day | SUBCUTANEOUS | Status: DC
  Administered 2014-09-10 – 2014-09-14 (×5): 10 [IU] via SUBCUTANEOUS
  Filled 2014-09-10 (×5): qty 0.1

## 2014-09-10 MED ORDER — BISACODYL 5 MG PO TBEC
5.0000 mg | DELAYED_RELEASE_TABLET | Freq: Every day | ORAL | Status: DC | PRN
  Administered 2014-09-10 – 2014-09-14 (×2): 5 mg via ORAL
  Filled 2014-09-10 (×2): qty 1

## 2014-09-10 MED ORDER — BUDESONIDE 0.5 MG/2ML IN SUSP
0.5000 mg | Freq: Two times a day (BID) | RESPIRATORY_TRACT | Status: DC
Start: 2014-09-10 — End: 2014-09-14
  Administered 2014-09-10 – 2014-09-14 (×8): 0.5 mg via RESPIRATORY_TRACT
  Filled 2014-09-10 (×11): qty 2

## 2014-09-10 MED ORDER — IPRATROPIUM-ALBUTEROL 0.5-2.5 (3) MG/3ML IN SOLN
3.0000 mL | RESPIRATORY_TRACT | Status: DC
  Administered 2014-09-10 – 2014-09-14 (×24): 3 mL via RESPIRATORY_TRACT
  Filled 2014-09-10 (×24): qty 3

## 2014-09-10 MED ORDER — IPRATROPIUM-ALBUTEROL 0.5-2.5 (3) MG/3ML IN SOLN
3.0000 mL | Freq: Four times a day (QID) | RESPIRATORY_TRACT | Status: DC
  Administered 2014-09-10 (×3): 3 mL via RESPIRATORY_TRACT
  Filled 2014-09-10 (×3): qty 3

## 2014-09-10 NOTE — Consult Note (Signed)
   [facility] CM Inpatient Consult   09/10/2014  Judith Boyer Aug 04, 1945 [id number] Referral received.  Patient had been involved with [facility] telephonic nurse.  Met patient at the bedside.  Patient sitting on side of bed pursed-lip breathing with oxygen on trying to make a cell phone call.  She states, "I am not doing too good right now.  Can you come back in the afternoon sometime later.  I do better in the afternoon."  Offered to call floor RN.  Patient states, "There's nothing they can do. I will be better in the afternoon."  Contact information given to the patient.  Will follow up with on site [facility], who will be available in the afternoon hours,  to follow up for consent, if possible.  For questions, please contact: Natividad Brood, RN BSN [facility] Liaison  [phone] business mobile phone

## 2014-09-10 NOTE — Consult Note (Signed)
Consultation Note Date: 09/10/2014   Patient Name: Judith Boyer  DOB: 01/19/1946  MRN: [id number]  Age / Sex: 69 y.o., female   PCP: [name], MD Referring Physician: Zenia Resides, MD  Reason for Consultation: Establishing goals of care and discussion of possible enrollment in hospice  Palliative Care Assessment and Plan Summary of Established Goals of Care and Medical Treatment Preferences   Clinical Assessment/Narrative: Judith Boyer is a 69 year old female w/ chronic respiratory failure/COPD on 2 liters of oxygen at baseline. She was admitted on 7/8 for an exacerbation of her COPD and reports that she never really returned to baseline. She was seen once again on 8/1 in the ER for progressive respiratory distress.  When she was seen by PCP she was admitted for AECOPD. Pulm has seen her made recs for changes to her regimen today.  Palliative was consulted for discussion of goals of care in light of chronic progression of her disease.  Met with Judith Boyer to discuss her recent clinical course as well as her goals moving forward. She reports that she feels that her disease has been worsening over the past 2 years and specifically over the past few months. She states that she has had 3 or more hospitalizations in the past several months.  When asked what is important for [place] to know regarding her care moving forward, she reports that her children are very important to her. She reports living alone, but next door to one of her sons. She wants to be out of the hospital but she also wants to ensure that her symptoms are well managed. When asked what is the most important thing to her she said "to feel better."  We discussed the chronic nature of the disease and she reports that she knows this is not something that is going to improve. She states that she is in agreement that her care plan moving forward should focus on her goals of being outside of the hospital and spending time with  family.  With her permission, we discussed the possibility of utilization of hospice in the future. She reports that she would likely be in agreement with considering hospice moving forward, however it is something she needs to discuss with her family. On discussion with her, however, she reports that she has family that works and so no one would be available to stay with her around-the-clock. At this time she feels that she can manage pretty well on her own with assistance of her son who lives next door and works near their homes, but she also reports being aware of the fact that she may not be able to be by herself in the future.  In the same regard, we discussed her CODE STATUS. Focus of this was on her goals of recovering enough to be at home. We discussed that in light of her chronic lung disease, initiation of resuscitative measures at time of death would not likely result in her recovering enough to return to her home, and she most likely would not return to her current baseline which she already reports is unacceptable quality of life. We also discussed that with her chronic lung condition there is great concern that if she is intubated that she will not be able to be weaned off the ventilator. She expressed understanding of both of these, and also endorsed that this is something that she would not want to have done. She does report that she needs to have  discussion with her family this evening before making any changes to her CODE STATUS. She asked that this be readdressed tomorrow.  - Discussed goals of care with patient as above, and also specifically addressed her questions regarding home hospice. Their services would likely be beneficial to her, however, there is concern that at some point in the future she would not be able to remain in her home as family is unable provide 24-hour care and 24 hour custodial care is not part of the hospice benefit. She reports plan to see how she feels after  changes recommended by pulmonary team and discuss this further with her family this evening. - Discussed CODE STATUS and patient reports that she wants to have conversation with her family this evening prior to making any changes. - Symptomatically, she reports feeling better than she did at admission. At the same time she reports that she still has significant shortness of breath, especially with movement. Encouraged her to continue to use her fan as it is been shown that air movement over trigeminal nerve distribution helps with symptomatically relief of dyspnea. Also discussed the potential role of opioids in treatment of dyspnea. She reports that she wants to see how she feels after interventions recommended by pulmonary prior to making any other changes.  - If patient's dyspnea remains uncontrolled, would recommend initiation of low-dose opioid therapy if she remains symptomatic. This would need to be done with care due to her respiratory status and concomitant benzodiazepine use. Would recommend initiation of morphine 2.5 mg by mouth 1 dose to see if tolerates. Would also hold benzo at time of her first dose until see how she tolerates. If tolerated, this can be given every 4 hours with plan to hold for any signs of sedation. If inadequate effect from this dose would recommend slow titrating upward by increments of 25-50% until effective with plan to hold if any sedation occurs. Owens Shark DJ. Palliation of Breathlessness. Clin Med. 2006;6;133-136) - Plan for follow-up tomorrow after she has a chance to discuss with her family.    Contacts/Participants in Discussion: Primary Decision Maker: Patient, reports her children help with medical decisions   HCPOA:none on chart  Code Status/Advance Care Planning:  FULL- To discuss with her family this evening  Symptom Management:  Dyspnea: Encouraged use of fan and other non-pharmacologic measures.  If dyspnea persists, would recommend trial of morphine  2.5 mg by mouth 1 dose to see if tolerates. If so, this can be given every 4 hours with plan to hold for any signs of sedation. Would not give benzo at same time as initial dose until see how she tolerates. If inadequate effect from this dose would recommend slow titrating upward by increments of 25-50% until effective with plan to hold any sedation occurs. Owens Shark DJ. Palliation of Breathlessness. Clin Med. 2006;6;133-136)   Additional Recommendations (Limitations, Scope, Preferences):  patient discuss her long-term goals of care with family this evening. We'll plan follow-up tomorrow. sycho-social/Spiritual:   Support System:reports her family is good support for her  Desire for further Chaplaincy support:no  Prognosis: < 6 months based upon disabling dyspnea at rest, little response to bronchodilators, decreased functional capacity, progression of disease with recent hospitalizations and ED visits, unintentional progressive weight loss, and propensity for hypercapnia with documented PCO2 >28mHg, her prognosis would qualify her for hospice benefit   Discharge Planning:  Patient to discuss with family this evening       Chief Complaint/History of Present Illness:  Judith Boyer  is a 69 year old female w/ chronic respiratory failure/COPD on 2 liters of oxygen at baseline. She was admitted on 7/8 for an exacerbation of her COPD and reports that she never really returned to baseline. She was seen once again on 8/1 in the ER for progressive respiratory distress.  When she was seen by PCP she was admitted for AECOPD. Pulm has seen her made recs for changes to her regimen today.  Palliative was consulted for discussion of goals of care in light of chronic progression of her disease.  She reports overall she still remains dyspneic at times. She reports this is worse as she is up and moving about. She denies any other complaints today and reports she just wants to feel better overall.   Primary  Diagnoses  Present on Admission:  . COPD exacerbation . COPD GOLD III . Anxiety . Hypertension . Tachycardia  Palliative Review of Systems Dyspnea as noted above, otherwise 10 point review of systems negative  I have reviewed the medical record, interviewed the patient and family, and examined the patient. The following aspects are pertinent.  Past Medical History  Diagnosis Date  . COPD (chronic obstructive pulmonary disease)   . Hypertension   . Coronary artery disease   . Shortness of breath   . High cholesterol   . Myocardial infarction 02/2011  . On home oxygen therapy     "2L; 24h/day" (02/08/2014)  . Pneumonia     "couple times" (02/08/2014)  . Type II diabetes mellitus     type 2  . Anemia   . History of blood transfusion 2014    "extremely anemic"   History   Social History  . Marital Status: Widowed    Spouse Name: N/A  . Number of Children: N/A  . Years of Education: N/A   Social History Main Topics  . Smoking status: Former Smoker -- 1.00 packs/day for 50 years    Types: Cigarettes    Quit date: 01/10/2013  . Smokeless tobacco: Never Used  . Alcohol Use: No  . Drug Use: No  . Sexual Activity: No   Other Topics Concern  . None   Social History Narrative   Family History  Problem Relation Age of Onset  . Heart disease Maternal Grandmother   . Brain cancer Brother   . Lung cancer Mother     smoked   Scheduled Meds: . budesonide (PULMICORT) nebulizer solution  0.5 mg Nebulization BID  . clopidogrel  75 mg Oral q morning - 10a  . guaiFENesin  600 mg Oral Daily  . heparin  5,000 Units Subcutaneous 3 times per day  . insulin aspart  0-5 Units Subcutaneous QHS  . insulin aspart  0-9 Units Subcutaneous TID WC  . insulin aspart  4 Units Subcutaneous TID WC  . insulin glargine  10 Units Subcutaneous Daily  . ipratropium-albuterol  3 mL Nebulization Q4H  . irbesartan  75 mg Oral Daily  . levofloxacin  750 mg Oral Q48H  . montelukast  10 mg Oral Q  supper  . pravastatin  80 mg Oral QPM  . predniSONE  60 mg Oral Q breakfast  . roflumilast  500 mcg Oral Daily  . sertraline  25 mg Oral Daily  . sodium chloride  3 mL Intravenous Q12H   Continuous Infusions:  PRN Meds:.sodium chloride, acetaminophen **OR** acetaminophen, albuterol, ALPRAZolam, bisacodyl, sodium chloride Medications Prior to Admission:  Prior to Admission medications   Medication Sig Start Date End Date Taking?  Authorizing Provider  albuterol (PROAIR HFA) 108 (90 BASE) MCG/ACT inhaler Inhale 2 puffs into the lungs every 4 (four) hours as needed for wheezing or shortness of breath. 08/17/14  Yes [name], MD  albuterol (PROVENTIL) (2.5 MG/3ML) 0.083% nebulizer solution Take 3 mLs (2.5 mg total) by nebulization every 4 (four) hours as needed for wheezing. 01/14/14  Yes [name], MD  ALPRAZolam Duanne Moron) 0.25 MG tablet Take 1 tablet (0.25 mg total) by mouth 2 (two) times daily as needed for anxiety. 08/17/14  Yes [name], MD  clopidogrel (PLAVIX) 75 MG tablet Take 1 tablet (75 mg total) by mouth every morning. 06/28/13  Yes [name], MD  Fluticasone-Salmeterol (ADVAIR) 500-50 MCG/DOSE AEPB Inhale 1 puff into the lungs 2 (two) times daily. 01/14/14  Yes [name], MD  guaiFENesin (MUCINEX) 600 MG 12 hr tablet Take 600 mg by mouth daily. Take every day per patient   Yes Historical Provider, MD  Iron TABS Take 1 tablet by mouth daily.   Yes Historical Provider, MD  metFORMIN (GLUCOPHAGE) 500 MG tablet Take 1 tablet (500 mg total) by mouth 2 (two) times daily with a meal. 07/24/14  Yes [name], MD  montelukast (SINGULAIR) 10 MG tablet take 1 tablet by mouth once daily Patient taking differently: take 1 tablet by mouth once every evening 07/09/14  Yes [name], MD  nitroGLYCERIN (NITROSTAT) 0.4 MG SL tablet Place 1 tablet (0.4 mg total) under the tongue every 5 (five) minutes x 3 doses as needed for chest pain. 02/07/12  Yes Dixie Dials, MD  pravastatin  (PRAVACHOL) 80 MG tablet Take 1 tablet (80 mg total) by mouth every evening. 06/28/13  Yes [name], MD  valsartan (DIOVAN) 80 MG tablet Take 1 tablet (80 mg total) by mouth daily. Patient taking differently: Take 80 mg by mouth every evening.  02/22/14  Yes Dorian Heckle English, PA  bisoprolol (ZEBETA) 5 MG tablet Take 1 tablet (5 mg total) by mouth daily. Patient not taking: Reported on 08/17/2014 06/21/14   Aquilla Hacker, MD   Allergies  Allergen Reactions  . Doxycycline Anaphylaxis  . Codeine Other (See Comments)    hyperactivity  . Dulera [Mometasone Furo-Formoterol Fum] Hives and Rash  . Spiriva Handihaler [Tiotropium Bromide Monohydrate] Rash and Other (See Comments)    Rash and Urinary Retention  . Adhesive [Tape] Rash  . Latex Other (See Comments)    unknown  . Lisinopril Cough   CBC:    Component Value Date/Time   WBC 8.4 09/07/2014 1748   WBC 10.4* 05/01/2014 1253   HGB 12.0 09/07/2014 1748   HGB 12.7 05/01/2014 1253   HCT 37.0 09/07/2014 1748   HCT 41.6 05/01/2014 1253   PLT 223 09/07/2014 1748   MCV 91.6 09/07/2014 1748   MCV 93.4 05/01/2014 1253   NEUTROABS 6.4 06/18/2014 1740   LYMPHSABS 2.3 06/18/2014 1740   MONOABS 0.5 06/18/2014 1740   EOSABS 0.1 06/18/2014 1740   BASOSABS 0.0 06/18/2014 1740   Comprehensive Metabolic Panel:    Component Value Date/Time   NA 137 09/09/2014 0210   K 3.9 09/09/2014 0210   CL 100* 09/09/2014 0210   CO2 31 09/09/2014 0210   BUN 15 09/09/2014 0210   CREATININE 0.48 09/09/2014 0210   CREATININE 0.54 05/01/2014 1245   GLUCOSE 206* 09/09/2014 0210   CALCIUM 8.8* 09/09/2014 0210   AST 22 09/06/2014 1830   ALT 25 09/06/2014 1830   ALKPHOS  66 09/06/2014 1830   BILITOT 0.3 09/06/2014 1830   PROT 5.6* 09/06/2014 1830   ALBUMIN 3.3* 09/06/2014 1830    Physical Exam: Vital Signs: BP 131/61 mmHg  Pulse 108  Temp(Src) 98.3 F (36.8 C) (Oral)  Resp 18  Ht 5' 1.5" (1.562 m)  Wt 43.046 kg (94 lb 14.4 oz)  BMI 17.64  kg/m2  SpO2 98% SpO2: SpO2: 98 % O2 Device: O2 Device: Nasal Cannula O2 Flow Rate: O2 Flow Rate (L/min): 2 L/min Intake/output summary:  Intake/Output Summary (Last 24 hours) at 09/10/14 1803 Last data filed at 09/10/14 1501  Gross per 24 hour  Intake    420 ml  Output    200 ml  Net    220 ml   LBM: Last BM Date: 09/08/14 Baseline Weight: Weight: 44.453 kg (98 lb) Most recent weight: Weight: 43.046 kg (94 lb 14.4 oz)  Exam Findings:  General: elderly female sitting on edge of bed with pursed lips breathing Cardiovascular: RRR, no murmurs appreciated Respiratory: breathing through pursed lips, cannot speak in full sentences, on 2L , prolonged expiratory phase but not much wheezing noted Abdomen: soft, non-tender, non-distended, +BS Neuro: A&Ox3, no focal deficits Psych: appropriate mood and affect          Palliative Performance Scale: 50%              Additional Data Reviewed: Recent Labs     09/09/14  0210  NA  137  BUN  15  CREATININE  0.48     Time In: 4:50  Time Out: 5:55 Time Total: 80 Greater than 50%  of this time was spent counseling and coordinating care related to the above assessment and plan.  Signed by: Micheline Rough, MD  Micheline Rough, MD  09/10/2014, 6:03 PM  Please contact Palliative Medicine Team phone at [phone] for questions and concerns.

## 2014-09-10 NOTE — Consult Note (Signed)
   [facility] CM Inpatient Consult   09/10/2014  Judith Boyer 18-Sep-1945 [id number]   [facility] Care Management bedside follow up. While speaking with patient about [facility] Care Management follow up, patient was due for respiratory treatment with Respiratory Therapy. Will attempt to come back at later time again.  Marthenia Rolling, MSN-Ed, RN,BSN [facility] Main Liaison [phone]

## 2014-09-10 NOTE — Care Management Important Message (Signed)
Important Message  Patient Details  Name: Judith Boyer MRN: [id number] Date of Birth: 05-10-1945   Medicare Important Message Given:  Yes-second notification given    Pricilla Handler 09/10/2014, 11:22 AM

## 2014-09-10 NOTE — Progress Notes (Signed)
Checked on patient.   Just had a breathing treatment. Some chest tightness from the deep breathing. No chest pain. Some non-productive cough. In general feels she's improving.   Tachycardic to the 100s, regular rhythm. No m/r/g noted. No increased WOB. Poor air movement up to mid lung b/l. No wheezing, rhonchi, or crackles noted. On 2L [place]  Will continue to monitor.

## 2014-09-10 NOTE — Progress Notes (Signed)
Family Medicine Teaching Service Daily Progress Note Intern Pager: [phone]  Patient name: Judith Boyer Medical record number: [id number] Date of birth: 1945-05-17 Age: 69 y.o. Gender: female  Primary Care Provider: Jenny Reichmann, MD Consultants: CCM, pulmonology Code Status: Full  Pt Overview and Major Events to Date:  08/03 - transferred to SDU for resp distress/BiPAP  Chief Complaint: shortness of breath  Assessment and Plan: Judith Boyer is a 69 y.o. female presenting with shortness of breath. PMH is significant for COPD with recent exacerbation requiring hospitalization in July, HTN, CAD, HLD, T2DM  # COPD exacerbation: 100% O2 sat on 2L [place]. HR and RR upper limits of normal. CCM aware of respiratory difficulties. Has not yet required BiPAP. - Cont duonebs q4hrs scheduled, albuterol neb q2 hrs PRN, prednisone 60mg  daily, levaquin 500mg  daily, advair - Continuous O2 keep sat >92% - Monitor respiratory status closely - Low threshold for BiPAP  - Symbicort changed to pulmicort for better med delivery to lungs - Pulmonology consulted given patient's lack of improvement - appreciate recommendations  # Tachycardia: suspected from albuterol treatments. Improved to high 90s. - Monitor  # Shoulder pain: given accompanying SOB, trended trop and EKG. Trops negative x3. Repeat EKG showed sinus rhythm with PVCs.  - Resolved  # T2DM: CBG elevated in 300s, 400s, but no evidence of DKA/HONK. CBGs high 200s in past 24 hr.   Received 26U Novolog yesterday. Will need close outpatient follow-up to adjust meds during steroid taper after discharge. - CBG qAC qHS - Begin Lantus 10U for basal coverage - Add SSI (thin, adjust as needed).   # HTN: BP 120s-130s/40-50s. - Will hold bisoprolol for now [facility] [address] formulary equivalent for valsartan  # Anxiety: stable - Increased Xanax to TID - Can receive extra dose if necessary for BiPAP trial   # CAD: stable - Continue home  plavix  # HLD: stable - Continue home pravastatin (has intolerances to atorva)  # Constipation: asked for stool softener  - Dulcolax  # Anemia: on iron tabs at home - D/c home iron given current levoquin treatment   # Dysphagia: Barium swallow showed patent esophagus with no explanation for symptoms.  - If symptoms persist, consider outpatient follow-up for repeat swallow   FEN/GI: diet carb mod / saline lock Prophylaxis: heparin   Disposition: home with home health  Subjective:  Patient still having difficulty breathing this morning. She is not able to speak in complete sentences without pausing to take a breath. She does not report swallowing difficulties this morning.   Objective: Temp:  [97.7 F (36.5 C)-98.3 F (36.8 C)] 98.3 F (36.8 C) (08/05 0609) Pulse Rate:  [95-117] 95 (08/05 0609) Resp:  [18-20] 19 (08/05 0609) BP: (120-147)/(47-66) 131/58 mmHg (08/05 0609) SpO2:  [97 %-100 %] 99 % (08/05 0827) Weight:  [94 lb 3.2 oz (42.729 kg)-94 lb 14.4 oz (43.046 kg)] 94 lb 14.4 oz (43.046 kg) (08/05 6010) Physical Exam: General: elderly lady sitting on edge of bed with obvious labored breathing Cardiovascular: RRR, no murmurs appreciated Respiratory: breathing through pursed lips, cannot speak in full sentences, on 2L [place] Abdomen: soft, non-tender, non-distended, +BS Neuro: A&Ox3, no focal deficits Psych: appropriate mood and affect   Laboratory:  Recent Labs Lab 09/06/14 1830 09/07/14 1748  WBC 7.1 8.4  HGB 12.1 12.0  HCT 37.5 37.0  PLT 213 223    Recent Labs Lab 09/06/14 1830 09/07/14 1748 09/09/14 0210  NA 138 137 137  K 3.5 4.3 3.9  CL 100* 97* 100*  CO2 32 30 31  BUN 7 13 15   CREATININE 0.43* 0.51 0.48  CALCIUM 8.6* 9.4 8.8*  PROT 5.6*  --   --   BILITOT 0.3  --   --   ALKPHOS 66  --   --   ALT 25  --   --   AST 22  --   --   GLUCOSE 214* 346* 206*    Imaging/Diagnostic Tests: Dg Chest 1 View  09/06/2014   CLINICAL DATA:  Trouble breathing  for 3 days.  EXAM: CHEST  1 VIEW  COMPARISON:  Radiographs 08/13/2014  FINDINGS: Normal cardiac silhouette. Lungs are hyperinflated. There is biapical pulmonary scarring unchanged from prior. No pneumothorax. No pulmonary edema. No infiltrate.  IMPRESSION: Hyper inflated lungs with apical scarring.  No acute findings.   Electronically Signed   By: Suzy Bouchard M.D.   On: 09/06/2014 18:25   Dg Esophagus  09/09/2014   CLINICAL DATA:  Food getting stuck in the lower chest for 2 days. Admitted to unit for COPD exacerbation.  EXAM: ESOPHOGRAM/BARIUM SWALLOW  TECHNIQUE: Single contrast examination was performed using  thin barium.  FLUOROSCOPY TIME:  Radiation Exposure Index (as provided by the fluoroscopic device): 0.5 mGy  Number of Acquired Images:  1  COMPARISON:  None.  FINDINGS: Unfortunately, most images of the esophagram were saved fluoroscopic images, but these were not archived or available for review.  A single still image of the esophagus was obtained, showing patency to the stomach. During real-time fluoroscopy, there was no impediment to passage of oral contrast in the normal diameter esophagus. The primary propulsion wave was strong in the upright position.  Note that the patient's extreme shortness of breath precluded recumbent positioning or standing for more than a minute. Sitting was not a possibility due to limitations of the fluoroscopy machine. If symptoms persist, study should be repeated when clinically able.  IMPRESSION: 1. Significantly limited study due to shortness of breath and inability to tolerate positioning. 2. Patent esophagus with no explanation for symptoms. If symptoms persist, need repeat in the outpatient setting.   Electronically Signed   By: Monte Fantasia M.D.   On: 09/09/2014 15:12    Verner Mould, MD 09/10/2014, 2:31 PM PGY-1, [place] Intern pager: [phone], text pages welcome

## 2014-09-10 NOTE — Consult Note (Signed)
Name: Judith Boyer MRN: [id number] DOB: 1945-03-18    ADMISSION DATE:  09/07/2014 CONSULTATION DATE:  8/4  REFERRING MD :  Hensel   CHIEF COMPLAINT:  COPD and chronic respiratory failure   BRIEF PATIENT DESCRIPTION:  14 yof w/ GOLD D COPD and chronic resp failure on 2 liters admitted 8/2 w/ AECOPD. PCCM asked to see on 8/5 for further recommendations re: symptom therapy.   SIGNIFICANT EVENTS    STUDIES:     HISTORY OF PRESENT ILLNESS:   This is a 69 year old female w/ chronic respiratory failure on the basis of GOLD D COPD. She is chronically on 2 liters of oxygen at baseline. She states that over the last 2 years she has had significant decline which has included ~ a 10 lb wt loss (now down to ~ 94 lbs), and worsening activity tolerance to the point she would consider walking the distance of her one floor house w/out stopping a good day. Most recently she was admitted on 7/8 for an exacerbation of her COPD. At that time she was treated w/ abx and pred taper. She was discharged to home w/ out-pt follow-up at which time her taper was extended. She noted that she felt a little better after d/c but never really returned to baseline and as the steroid taper continued her respiratory status continued to worsen. She was seen once again on 8/1 in the ER for progressive respiratory distress. Given steroids and SABA, then referred to her primary the next day. When she was seen she was again admitted for AECOPD. Since admit she has been treated in the usual fashion which included: oxygen, scheduled BDs and steroid taper. PCCM was asked to see on 8/5 for further recommendations.    PAST MEDICAL HISTORY :   has a past medical history of COPD (chronic obstructive pulmonary disease); Hypertension; Coronary artery disease; Shortness of breath; High cholesterol; Myocardial infarction (02/2011); On home oxygen therapy; Pneumonia; Type II diabetes mellitus; Anemia; and History of blood transfusion  (2014).  has past surgical history that includes Shoulder arthroscopy w/ rotator cuff repair (Left); left heart catheterization with coronary angiogram (N/A, 02/09/2011); Tonsillectomy; Abdominal hysterectomy (1970's); Coronary angioplasty with stent (12/2007); and Cardiac catheterization (02/2011). Prior to Admission medications   Medication Sig Start Date End Date Taking? Authorizing Provider  albuterol (PROAIR HFA) 108 (90 BASE) MCG/ACT inhaler Inhale 2 puffs into the lungs every 4 (four) hours as needed for wheezing or shortness of breath. 08/17/14  Yes [name], MD  albuterol (PROVENTIL) (2.5 MG/3ML) 0.083% nebulizer solution Take 3 mLs (2.5 mg total) by nebulization every 4 (four) hours as needed for wheezing. 01/14/14  Yes [name], MD  ALPRAZolam Duanne Moron) 0.25 MG tablet Take 1 tablet (0.25 mg total) by mouth 2 (two) times daily as needed for anxiety. 08/17/14  Yes [name], MD  clopidogrel (PLAVIX) 75 MG tablet Take 1 tablet (75 mg total) by mouth every morning. 06/28/13  Yes [name], MD  Fluticasone-Salmeterol (ADVAIR) 500-50 MCG/DOSE AEPB Inhale 1 puff into the lungs 2 (two) times daily. 01/14/14  Yes [name], MD  guaiFENesin (MUCINEX) 600 MG 12 hr tablet Take 600 mg by mouth daily. Take every day per patient   Yes Historical Provider, MD  Iron TABS Take 1 tablet by mouth daily.   Yes Historical Provider, MD  metFORMIN (GLUCOPHAGE) 500 MG tablet Take 1 tablet (500 mg total) by mouth 2 (two) times daily with a meal. 07/24/14  Yes [name], MD  montelukast (SINGULAIR) 10 MG tablet take 1 tablet by mouth once daily Patient taking differently: take 1 tablet by mouth once every evening 07/09/14  Yes [name], MD  nitroGLYCERIN (NITROSTAT) 0.4 MG SL tablet Place 1 tablet (0.4 mg total) under the tongue every 5 (five) minutes x 3 doses as needed for chest pain. 02/07/12  Yes Dixie Dials, MD  pravastatin (PRAVACHOL) 80 MG tablet Take 1 tablet (80 mg total) by mouth every  evening. 06/28/13  Yes [name], MD  valsartan (DIOVAN) 80 MG tablet Take 1 tablet (80 mg total) by mouth daily. Patient taking differently: Take 80 mg by mouth every evening.  02/22/14  Yes Dorian Heckle English, PA  bisoprolol (ZEBETA) 5 MG tablet Take 1 tablet (5 mg total) by mouth daily. Patient not taking: Reported on 08/17/2014 06/21/14   Aquilla Hacker, MD   Allergies  Allergen Reactions  . Doxycycline Anaphylaxis  . Codeine Other (See Comments)    hyperactivity  . Dulera [Mometasone Furo-Formoterol Fum] Hives and Rash  . Spiriva Handihaler [Tiotropium Bromide Monohydrate] Rash and Other (See Comments)    Rash and Urinary Retention  . Adhesive [Tape] Rash  . Latex Other (See Comments)    unknown  . Lisinopril Cough    FAMILY HISTORY:  family history includes Brain cancer in her brother; Heart disease in her maternal grandmother; Lung cancer in her mother. SOCIAL HISTORY:  reports that she quit smoking about 20 months ago. Her smoking use included Cigarettes. She has a 50 pack-year smoking history. She has never used smokeless tobacco. She reports that she does not drink alcohol or use illicit drugs.  REVIEW OF SYSTEMS:   Constitutional: Negative for fever, chills, weight loss, malaise/fatigue and diaphoresis.  HENT: Negative for hearing loss, ear pain, nosebleeds, congestion, sore throat, neck pain, tinnitus and ear discharge.   Eyes: Negative for blurred vision, double vision, photophobia, pain, discharge and redness.  Respiratory:cough dry and NP, no hemoptysis, sputum production, marked shortness of breath, but WOB does seem to subside when she is distracted or talking about her family, wheezing and stridor.   Cardiovascular: Negative for chest pain, palpitations, orthopnea, claudication, leg swelling and PND.  Gastrointestinal: Negative for heartburn, nausea, vomiting, abdominal pain, diarrhea, constipation, blood in stool and melena.  Genitourinary: Negative for dysuria,  urgency, frequency, hematuria and flank pain.  Musculoskeletal: Negative for myalgias, back pain, joint pain and falls.  Skin: Negative for itching and rash.  Neurological: Negative for dizziness, tingling, tremors, sensory change, speech change, focal weakness, seizures, loss of consciousness, weakness and headaches.  Endo/Heme/Allergies: Negative for environmental allergies and polydipsia. Does not bruise/bleed easily.  SUBJECTIVE: very SOB   VITAL SIGNS: Temp:  [97.7 F (36.5 C)-98.3 F (36.8 C)] 98.3 F (36.8 C) (08/05 0609) Pulse Rate:  [95-117] 95 (08/05 0609) Resp:  [18-20] 19 (08/05 0609) BP: (120-147)/(47-66) 131/58 mmHg (08/05 0609) SpO2:  [97 %-100 %] 99 % (08/05 0827) Weight:  [42.729 kg (94 lb 3.2 oz)-43.046 kg (94 lb 14.4 oz)] 43.046 kg (94 lb 14.4 oz) (08/05 0609)  PHYSICAL EXAMINATION: General:  Frail 69 year old female sitting up right in bed Neuro:  Awake, anxious no focal def  HEENT:  [place] no JVD  Cardiovascular:  rrr Lungs:  Pursed lip breathing poor air movement  Abdomen:  Soft, + bowel sounds Musculoskeletal:  Intact  Skin:  Intact    Recent Labs Lab 09/06/14 1830 09/07/14 1748 09/09/14 0210  NA  138 137 137  K 3.5 4.3 3.9  CL 100* 97* 100*  CO2 32 30 31  BUN 7 13 15   CREATININE 0.43* 0.51 0.48  GLUCOSE 214* 346* 206*    Recent Labs Lab 09/06/14 1830 09/07/14 1748  HGB 12.1 12.0  HCT 37.5 37.0  WBC 7.1 8.4  PLT 213 223   Dg Esophagus  09/09/2014   CLINICAL DATA:  Food getting stuck in the lower chest for 2 days. Admitted to unit for COPD exacerbation.  EXAM: ESOPHOGRAM/BARIUM SWALLOW  TECHNIQUE: Single contrast examination was performed using  thin barium.  FLUOROSCOPY TIME:  Radiation Exposure Index (as provided by the fluoroscopic device): 0.5 mGy  Number of Acquired Images:  1  COMPARISON:  None.  FINDINGS: Unfortunately, most images of the esophagram were saved fluoroscopic images, but these were not archived or available for review.  A  single still image of the esophagus was obtained, showing patency to the stomach. During real-time fluoroscopy, there was no impediment to passage of oral contrast in the normal diameter esophagus. The primary propulsion wave was strong in the upright position.  Note that the patient's extreme shortness of breath precluded recumbent positioning or standing for more than a minute. Sitting was not a possibility due to limitations of the fluoroscopy machine. If symptoms persist, study should be repeated when clinically able.  IMPRESSION: 1. Significantly limited study due to shortness of breath and inability to tolerate positioning. 2. Patent esophagus with no explanation for symptoms. If symptoms persist, need repeat in the outpatient setting.   Electronically Signed   By: Monte Fantasia M.D.   On: 09/09/2014 15:12    ASSESSMENT / PLAN:  Chronic respiratory failure  AECOPD End stage COPD (GOLD D) Severe anxiety  Depression  FTT  Protein calorie malnutrition  Ms Silvestri has end-stage COPD w/ really little to offer on what is essentially maximized therapy given that she has had allergic reaction to nebulized formoterol and severe urinary retention w/ spiriva. She is not moving much air and continues to have what appears to be significant air trapping. It would appear as though steroids have had some effectiveness w/ her symptoms as when she decreases them her symptoms worsen. Also her anxiety and reflux are also contributing factors. We have had a long discussion about the typical disease progression of COPD, including life support. She states she does not want to suffer on a ventilator machine, but wanted to speak about this with her children. Given her disease and symptom burden she would be an excellent candidate for home hospice.   Plan Will cont qid duobneb-->ideally would also add brovana but she had allergic reaction to formoterol so worried that may not do well w/ aformoterol either.  Cont  budesonide (doubt she can effectively take advair) Add flutter Will add daliresp (worth a try given the severity of her symptoms) Cont reflux precautions Add low dose zoloft (she is agreeable to this) Cont TID PRN xanax Have discussed home hospice w/ her and she has agreed to talk w/ hospice representative.  Cont slow pred taper We will need to see her in f/u in the office after [place] [facility] [place] Pager # [phone] OR # [phone] if no answer   09/10/2014, 2:54 PM

## 2014-09-11 LAB — GLUCOSE, CAPILLARY
GLUCOSE-CAPILLARY: 165 mg/dL — AB (ref 65–99)
Glucose-Capillary: 205 mg/dL — ABNORMAL HIGH (ref 65–99)
Glucose-Capillary: 144 mg/dL — ABNORMAL HIGH (ref 65–99)
Glucose-Capillary: 323 mg/dL — ABNORMAL HIGH (ref 65–99)
Glucose-Capillary: 203 mg/dL — ABNORMAL HIGH (ref 65–99)

## 2014-09-11 NOTE — Progress Notes (Signed)
Daily Progress Note   Patient Name: Judith Boyer       Date: 09/11/2014 DOB: April 09, 1945  Age: 69 y.o. MRN#: [id number] Attending Physician: Zenia Resides, MD Primary Care Physician: Jenny Reichmann, MD Admit Date: 09/07/2014  Reason for Consultation/Follow-up: Establishing goals of care  Subjective: Ms. Poust is a 69 year old female w/ chronic respiratory failure/COPD on 2 liters of oxygen at baseline. She was admitted on 7/8 for an exacerbation of her COPD and reports that she never really returned to baseline. She was seen once again on 8/1 in the ER for progressive respiratory distress. When she was seen by PCP she was admitted for AECOPD. Pulm has seen her made recs for changes to her regimen yesterday. Palliative was consulted for discussion of goals of care in light of chronic progression of her disease.   Interval Events: Patient reports that she was unable to discuss our conversation yesterday with her children.  She reports feeling anxious and "not wanting to talk and have all the questions right now."  When asked if her feelings were more anxiety or dyspnea, she reported feeling anxious and not wanting to talk to any more doctors today.  She says that she called out to ask for her Xanax just prior to my visit and is waiting for it to arrive.   Length of Stay: 4 days  Current Medications: Scheduled Meds:  . budesonide (PULMICORT) nebulizer solution  0.5 mg Nebulization BID  . clopidogrel  75 mg Oral q morning - 10a  . guaiFENesin  600 mg Oral Daily  . heparin  5,000 Units Subcutaneous 3 times per day  . insulin aspart  0-5 Units Subcutaneous QHS  . insulin aspart  0-9 Units Subcutaneous TID WC  . insulin aspart  4 Units Subcutaneous TID WC  . insulin glargine  10 Units Subcutaneous Daily  . ipratropium-albuterol  3 mL Nebulization Q4H  . irbesartan  75 mg Oral Daily  . levofloxacin  750 mg Oral Q48H  . montelukast  10 mg Oral Q supper  . pravastatin  80 mg Oral QPM    . predniSONE  60 mg Oral Q breakfast  . roflumilast  500 mcg Oral Daily  . sertraline  25 mg Oral Daily  . sodium chloride  3 mL Intravenous Q12H    Continuous Infusions:    PRN Meds: sodium chloride, acetaminophen **OR** acetaminophen, albuterol, ALPRAZolam, bisacodyl, sodium chloride  Palliative Performance Scale: 40%     Vital Signs: BP 147/53 mmHg  Pulse 114  Temp(Src) 98 F (36.7 C) (Oral)  Resp 18  Ht 5' 1.5" (1.562 m)  Wt 43.319 kg (95 lb 8 oz)  BMI 17.75 kg/m2  SpO2 98% SpO2: SpO2: 98 % O2 Device: O2 Device: Nasal Cannula O2 Flow Rate: O2 Flow Rate (L/min): 2 L/min  Intake/output summary:  Intake/Output Summary (Last 24 hours) at 09/11/14 1433 Last data filed at 09/11/14 0900  Gross per 24 hour  Intake    600 ml  Output    600 ml  Net      0 ml   LBM:   Baseline Weight: Weight: 44.453 kg (98 lb) Most recent weight: Weight: 43.319 kg (95 lb 8 oz) (Scale A)  Physical Exam: General: elderly female sitting in bed; pursed lips breathing Cardiovascular: RRR, no murmurs appreciated Respiratory: breathing through pursed lips, does not speak in full sentences,  in place, prolonged expiratory phase but not much wheezing noted Abdomen: soft, non-tender, non-distended, +BS Neuro:  moves 4 extremities, no focal deficits Psych: appropriate mood and affect               Additional Data Reviewed: Recent Labs     09/09/14  0210  NA  137  BUN  15  CREATININE  0.48     Problem List:  Patient Active Problem List   Diagnosis Date Noted  . Palliative care by specialist   . Dysphagia   . Dyspnea 08/13/2014  . Tachycardia   . Respiratory acidosis   . Coronary artery disease involving native coronary artery of native heart without angina pectoris   . Type 2 diabetes mellitus without complication   . Anxiety   . COPD with acute exacerbation 06/18/2014  . B12 deficiency 02/10/2014  . Protein-calorie malnutrition, severe 02/09/2014  . Acute respiratory  distress 02/08/2014  . Vitamin B 12 deficiency 06/28/2013  . Palliative care encounter 02/25/2013  . Weakness generalized 02/25/2013  . COPD exacerbation 02/20/2013  . CAP (community acquired pneumonia) 02/20/2013  . Anemia 02/20/2013  . Nocturnal hypoxemia 12/30/2012  . Chest pain at rest 02/09/2011  . CAD in native artery 02/09/2011  . COPD GOLD III 02/06/2011  . Smoker 02/06/2011  . Hypertension 02/06/2011  . Diabetes mellitus type 2, controlled, without complications 87/68/1157     Palliative Care Assessment & Plan    Code Status:  Full code  Goals of Care:  Patient reports that she was unable to discuss with her family last evening as planned. She states that she has nothing else to discuss today, and when asked she said she has no further questions. She has to be left alone the remainder of the day but invited me to follow-up again tomorrow.  Symptom Management:  Dyspnea: Patient reports that her predominant problem time of encounter is anxiety and not dyspnea. If dyspnea becomes more of an issue, would recommend initiation of low-dose morphine for symptomatic relief. If concerned about her respiratory status, especially in light of her concomittent benzodiazepine use, this can be initiated at dose as low as 1 mg oral morphine. If tolerated, this can be given every 4 hours with plan to hold for any signs of sedation. If inadequate effect from this dose, would recommend slow titrating upward by increments of 25-50% until effective with plan to hold if any sedation occurs.  Anxiety: Patient appeared acutely anxious during my visit today. She reports that she had called asked for dose of when necessary Xanax prior to me coming into the room. She has been started on an SSRI. Agree with continuation of low dose and so on as-needed basis however this will need to be monitored closely in light of her respiratory status especially if opioids are initiated for  dyspnea.  Psycho-social/Spiritual:  Desire for further Chaplaincy support:no   Prognosis: < 6 months Discharge Planning: Pending   Care plan was discussed with patient and family medicine service on call resident  Thank you for allowing the Palliative Medicine Team to assist in the care of this patient.   Time In: 2:20 Time Out: 2:40 Total Time 25 Prolonged Time Billed  no     Greater than 50%  of this time was spent counseling and coordinating care related to the above assessment and plan.   Micheline Rough, MD  09/11/2014, 2:33 PM  Please contact Palliative Medicine Team phone at [phone] for questions and concerns.

## 2014-09-11 NOTE — Progress Notes (Signed)
Family Medicine Teaching Service Daily Progress Note Intern Pager: [phone]  Patient name: Judith Boyer Medical record number: [id number] Date of birth: [birth year] Age: 69 y.o. Gender: female  Primary Care Provider: Jenny Reichmann, MD Consultants: CCM, pulmonology Code Status: Full  Pt Overview and Major Events to Date:  08/03 - transferred to SDU for resp distress/BiPAP  Chief Complaint: shortness of breath  Assessment and Plan: Judith Boyer is a 69 y.o. female presenting with shortness of breath. PMH is significant for COPD with recent exacerbation requiring hospitalization in July, HTN, CAD, HLD, T2DM  # COPD exacerbation: 100% O2 sat on 2L [place]. HR and RR upper limits of normal. CCM aware of respiratory difficulties. Has not yet required BiPAP. - Cont duonebs q4hrs scheduled, albuterol neb q2 hrs PRN, prednisone 60mg  daily, levaquin 500mg  daily, advair - Continuous O2 keep sat >92% - Monitor respiratory status closely - Low threshold for BiPAP  - Continue pulmicort 0.5 mg by neb BID - Pulmonology consulted 09/11/14 given lack of progress and recommended daliresp and to continue singulair. - Continue daliresp 500 mcg daily.   - Palliative care consulted and following to continue conversation about code status.   # Tachycardia: suspected from albuterol treatments. Improved to high 90s. - Monitor  # Shoulder pain: given accompanying SOB, trended trop and EKG. Trops negative x3. Repeat EKG showed sinus rhythm with PVCs.  - Resolved  # T2DM: CBG elevated in 300s, 400s, but no evidence of DKA/HONK. CBGs high 200s in past 24 hr.   Received 26U Novolog yesterday. Will need close outpatient follow-up to adjust meds during steroid taper after discharge. - CBG qAC qHS - Begin Lantus 10U for basal coverage - Add SSI (thin, adjust as needed).   # HTN: BP 120s-130s/40-50s. - Will hold bisoprolol for now [facility] formulary equivalent for valsartan  # Anxiety: stable -  Increased Xanax to TID - Can receive extra dose if necessary for BiPAP trial   # CAD: stable - Continue home plavix  # HLD: stable - Continue home pravastatin (has intolerances to atorva)  # Constipation: Had a bowel movement yesterday.  - Dulcolax  # Anemia: on iron tabs at home - D/c home iron given current levoquin treatment   # Dysphagia: Barium swallow showed patent esophagus with no explanation for symptoms.  - No complaints 09/12/14.   FEN/GI: diet carb mod / saline lock Prophylaxis: heparin   Disposition: home with home health  Subjective:  Patient states that she thinks her breathing is a little better this morning. She says she is working a little harder now because she just received an albuterol treatment. She believes the new medication from yesterday (daliresp) is helping. She was not able to reach all of her children to continue the conversation about code status begun by palliative care 09/11/14 but will reach them as able.   Objective: Temp:  [97.9 F (36.6 C)-98.4 F (36.9 C)] 97.9 F (36.6 C) (08/06 0947) Pulse Rate:  [95-108] 97 (08/06 0947) Resp:  [18] 18 (08/06 0947) BP: (121-131)/(39-64) 125/39 mmHg (08/06 0947) SpO2:  [98 %-100 %] 99 % (08/06 1031) Weight:  [95 lb 8 oz (43.319 kg)] 95 lb 8 oz (43.319 kg) (08/06 0521) Physical Exam: General: elderly lady sitting on edge of bed with slightly labored breathing Cardiovascular: RRR, no murmurs appreciated Respiratory: can speak in full sentences, on 2L [place]; inspiratory wheezes appreciated at upper lung fields Neuro: A&Ox3, no focal deficits Psych: appropriate mood and affect  Laboratory:  Recent Labs Lab 09/06/14 1830 09/07/14 1748  WBC 7.1 8.4  HGB 12.1 12.0  HCT 37.5 37.0  PLT 213 223    Recent Labs Lab 09/06/14 1830 09/07/14 1748 09/09/14 0210  NA 138 137 137  K 3.5 4.3 3.9  CL 100* 97* 100*  CO2 32 30 31  BUN 7 13 15   CREATININE 0.43* 0.51 0.48  CALCIUM 8.6* 9.4 8.8*  PROT 5.6*  --    --   BILITOT 0.3  --   --   ALKPHOS 66  --   --   ALT 25  --   --   AST 22  --   --   GLUCOSE 214* 346* 206*    Imaging/Diagnostic Tests: Dg Esophagus  09/09/2014   CLINICAL DATA:  Food getting stuck in the lower chest for 2 days. Admitted to unit for COPD exacerbation.  EXAM: ESOPHOGRAM/BARIUM SWALLOW  TECHNIQUE: Single contrast examination was performed using  thin barium.  FLUOROSCOPY TIME:  Radiation Exposure Index (as provided by the fluoroscopic device): 0.5 mGy  Number of Acquired Images:  1  COMPARISON:  None.  FINDINGS: Unfortunately, most images of the esophagram were saved fluoroscopic images, but these were not archived or available for review.  A single still image of the esophagus was obtained, showing patency to the stomach. During real-time fluoroscopy, there was no impediment to passage of oral contrast in the normal diameter esophagus. The primary propulsion wave was strong in the upright position.  Note that the patient's extreme shortness of breath precluded recumbent positioning or standing for more than a minute. Sitting was not a possibility due to limitations of the fluoroscopy machine. If symptoms persist, study should be repeated when clinically able.  IMPRESSION: 1. Significantly limited study due to shortness of breath and inability to tolerate positioning. 2. Patent esophagus with no explanation for symptoms. If symptoms persist, need repeat in the outpatient setting.   Electronically Signed   By: Monte Fantasia M.D.   On: 09/09/2014 15:12    Rogue Bussing, MD 09/11/2014, 12:29 PM PGY-1, [place] Intern pager: [phone], text pages welcome

## 2014-09-12 DIAGNOSIS — J9621 Acute and chronic respiratory failure with hypoxia: Secondary | ICD-10-CM

## 2014-09-12 LAB — GLUCOSE, CAPILLARY
GLUCOSE-CAPILLARY: 292 mg/dL — AB (ref 65–99)
Glucose-Capillary: 110 mg/dL — ABNORMAL HIGH (ref 65–99)
Glucose-Capillary: 104 mg/dL — ABNORMAL HIGH (ref 65–99)
Glucose-Capillary: 198 mg/dL — ABNORMAL HIGH (ref 65–99)

## 2014-09-12 MED ORDER — ALPRAZOLAM 0.25 MG PO TABS
0.2500 mg | ORAL_TABLET | Freq: Every day | ORAL | Status: DC
  Administered 2014-09-12 – 2014-09-13 (×2): 0.25 mg via ORAL
  Filled 2014-09-12 (×2): qty 1

## 2014-09-12 NOTE — Progress Notes (Signed)
Daily Progress Note   Patient Name: Judith Boyer       Date: 09/12/2014 DOB: 1945/10/30  Age: 69 y.o. MRN#: [id number] Attending Physician: Zenia Resides, MD Primary Care Physician: Jenny Reichmann, MD Admit Date: 09/07/2014  Reason for Consultation/Follow-up: Establishing goals of care  Subjective: Judith Boyer is a 69 year old female w/ chronic respiratory failure/COPD on 2 liters of oxygen at baseline. She was admitted on 7/8 for an exacerbation of her COPD and reports that she never really returned to baseline. She was seen once again on 8/1 in the ER for progressive respiratory distress. When she was seen by PCP she was admitted for AECOPD. Pulm has seen her made recs for changes to her regimen yesterday. Palliative was consulted for discussion of goals of care in light of chronic progression of her disease.   Interval Events: Met with patient this morning. She reports that she is not in the mood to talk today. Reports she is very frustrated by not getting better more quickly and having to speak with so many doctors. She still has not been able to speak with her children regarding care plan moving forward.    She then stated, "questions. Questions. All day long. I just want to be left alone."  She did endorse feeling as though she was short of breath. I discussed with her again the use of low-dose opioids for symptomatic relief of dyspnea. She asked if this would actually fix any of her underlying problems. I told her the purpose would be for her to feel that her which is important for her to continue to improve. She stated that she is not interested and relayed that she had other family members take opioids in the past and they "went crazy." I tried to assure her that the plan would be to use a low-dose of medications in order to gain symptomatic relief of her shortness of breath, however she stated she was "not interested so stop asking." She then repeated request to be left alone but  asked me to have her nurse give her a Xanax.  Length of Stay: 5 days  Current Medications: Scheduled Meds:  . ALPRAZolam  0.25 mg Oral QHS  . budesonide (PULMICORT) nebulizer solution  0.5 mg Nebulization BID  . clopidogrel  75 mg Oral q morning - 10a  . guaiFENesin  600 mg Oral Daily  . heparin  5,000 Units Subcutaneous 3 times per day  . insulin aspart  0-5 Units Subcutaneous QHS  . insulin aspart  0-9 Units Subcutaneous TID WC  . insulin aspart  4 Units Subcutaneous TID WC  . insulin glargine  10 Units Subcutaneous Daily  . ipratropium-albuterol  3 mL Nebulization Q4H  . irbesartan  75 mg Oral Daily  . levofloxacin  750 mg Oral Q48H  . montelukast  10 mg Oral Q supper  . pravastatin  80 mg Oral QPM  . predniSONE  60 mg Oral Q breakfast  . roflumilast  500 mcg Oral Daily  . sertraline  25 mg Oral Daily  . sodium chloride  3 mL Intravenous Q12H    Continuous Infusions:    PRN Meds: sodium chloride, acetaminophen **OR** acetaminophen, albuterol, ALPRAZolam, bisacodyl, sodium chloride  Palliative Performance Scale: 40%     Vital Signs: BP 138/50 mmHg  Pulse 100  Temp(Src) 98.4 F (36.9 C) (Oral)  Resp 20  Ht 5' 1.5" (1.562 m)  Wt 43.3 kg (95 lb 7.4 oz)  BMI 17.75 kg/m2  SpO2 98% SpO2: SpO2: 98 % O2 Device: O2 Device: Nasal Cannula O2 Flow Rate: O2 Flow Rate (L/min): 2 L/min  Intake/output summary:   Intake/Output Summary (Last 24 hours) at 09/12/14 1125 Last data filed at 09/11/14 2035  Gross per 24 hour  Intake    660 ml  Output    400 ml  Net    260 ml   LBM:   Baseline Weight: Weight: 44.453 kg (98 lb) Most recent weight: Weight: 43.3 kg (95 lb 7.4 oz)  Physical Exam: General: elderly female sitting in bed; pursed lips breathing Cardiovascular: RRR, no murmurs appreciated Respiratory: breathing through pursed lips, does not speak in full sentences, [place] in place, prolonged expiratory phase and wheezing noted Abdomen: soft, non-tender, non-distended,  +BS Neuro: moves 4 extremities, no focal deficits Psych: appropriate mood and affect               Additional Data Reviewed: No results for input(s): WBC, HGB, PLT, NA, BUN, CREATININE, ALB in the last 72 hours.   Problem List:  Patient Active Problem List   Diagnosis Date Noted  . Acute on chronic respiratory failure with hypoxia   . Palliative care by specialist   . Dysphagia   . Dyspnea 08/13/2014  . Tachycardia   . Respiratory acidosis   . Coronary artery disease involving native coronary artery of native heart without angina pectoris   . Type 2 diabetes mellitus without complication   . Anxiety   . COPD with acute exacerbation 06/18/2014  . B12 deficiency 02/10/2014  . Protein-calorie malnutrition, severe 02/09/2014  . Acute respiratory distress 02/08/2014  . Vitamin B 12 deficiency 06/28/2013  . Palliative care encounter 02/25/2013  . Weakness generalized 02/25/2013  . COPD exacerbation 02/20/2013  . CAP (community acquired pneumonia) 02/20/2013  . Anemia 02/20/2013  . Nocturnal hypoxemia 12/30/2012  . Chest pain at rest 02/09/2011  . CAD in native artery 02/09/2011  . COPD GOLD III 02/06/2011  . Smoker 02/06/2011  . Hypertension 02/06/2011  . Diabetes mellitus type 2, controlled, without complications 14/78/2956     Palliative Care Assessment & Plan    Code Status:  Full code  Goals of Care:  Patient reports that she has not discussed with her family and asked to be left alone.  Symptom Management:  Dyspnea: Patient reports feeling dyspneic today. Attempted to talk with her about use of low-dose opioids for symptomatic management. She reports that she is uninterested in this intervention. I have encouraged her to use other modalities, including her bedside fan.   Would recommend initiation of low-dose morphine for symptomatic relief, however patient reports that she has had friends and family have adverse reactions to opioids in the past and is not  interested.   Anxiety: Patient appeared acutely anxious during my visit today.  She has been started on an SSRI. Agree with continuation of Xanax at low dose on as-needed basis with continued close monitoring.  Psycho-social/Spiritual:  Desire for further Chaplaincy support:no   Prognosis: < 6 months Discharge Planning: Pending   Care plan was discussed with patient and Dr. Lamar Benes  Thank you for allowing the Palliative Medicine Team to assist in the care of this patient.   Time In: 11:00 Time Out: 11:20 Total Time 25 Prolonged Time Billed  no     Greater than 50%  of this time was spent counseling and coordinating care related to the above assessment and plan.   Micheline Rough, MD  09/12/2014, 11:25 AM  Please  contact Palliative Medicine Team phone at [phone] for questions and concerns.

## 2014-09-12 NOTE — Progress Notes (Signed)
Family Medicine Teaching Service Daily Progress Note Intern Pager: [phone]  Patient name: Judith Boyer Medical record number: [id number] Date of birth: February 11, 1945 Age: 69 y.o. Gender: female  Primary Care Provider: Jenny Reichmann, MD Consultants: CCM, pulmonology Code Status: Full  Pt Overview and Major Events to Date:  8/3 - transferred to SDU for resp distress/BiPAP 8/5 - out of SDU, added daliresp  Chief Complaint: shortness of breath  Assessment and Plan: Judith Boyer is a 69 y.o. female presenting with shortness of breath. PMH is significant for COPD with recent exacerbation requiring hospitalization in July, HTN, CAD, HLD, T2DM  # COPD exacerbation: 100% O2 sat on 2L [place]. HR and RR upper limits of normal. CCM aware of respiratory difficulties. Has not yet required BiPAP. - Cont duonebs q4hrs scheduled, albuterol neb q2 hrs PRN, prednisone 60mg  daily, levaquin 500mg  daily, advair - Continuous O2 keep sat >92% - Monitor respiratory status closely - Low threshold for BiPAP  - Continue pulmicort 0.5 mg by neb BID - Pulmonology consulted 09/11/14 given lack of progress and recommended daliresp and to continue singulair. - Continue daliresp 500 mcg daily.   - Palliative care consulted and following to continue conversation about code status.   # T2DM: Lantus started 8/5. CBG controlled last 24hrs. 8/5 received 27U novolog; 8/6 received 24U novolog - CBG qAC qHS - Lantus 10U for basal coverage - SSI (thin, adjust as needed).   # HTN: BP 120s-130s/40-50s. - Will hold bisoprolol for now [facility] formulary equivalent for valsartan  # Tachycardia: suspected from albuterol treatments. Improved to high 90s. - Monitor  # Anxiety: stable - Increased Xanax to TID - Can receive extra dose if necessary for BiPAP trial   # CAD: stable - Continue home plavix  # HLD: stable - Continue home pravastatin (has intolerances to atorva)  # Constipation: Had a bowel movement  yesterday.  - Dulcolax  # Anemia: on iron tabs at home - D/c home iron given current levoquin treatment   # Dysphagia: Barium swallow showed patent esophagus with no explanation for symptoms.  - No complaints 09/12/14.   # Shoulder pain: given accompanying SOB, trended trop and EKG. Trops negative x3. Repeat EKG showed sinus rhythm with PVCs.  - Resolved  FEN/GI: diet carb mod / saline lock Prophylaxis: heparin   Disposition: pending improvement in breathing status  Subjective:  States breathing feels the same, no improvement or worsening. Still has a dry cough. She is unsure if the nebulized pulmicort is helping. No CP, no fevers.   Objective: Temp:  [97.6 F (36.4 C)-98.4 F (36.9 C)] 98.4 F (36.9 C) (08/07 0515) Pulse Rate:  [97-114] 100 (08/07 0515) Resp:  [18-22] 20 (08/07 0515) BP: (125-148)/(39-60) 138/50 mmHg (08/07 0515) SpO2:  [95 %-100 %] 98 % (08/07 0822) Weight:  [95 lb 7.4 oz (43.3 kg)] 95 lb 7.4 oz (43.3 kg) (08/07 0515) Physical Exam: General: elderly lady sitting on edge of bed, tripod Cardiovascular: RRR, no murmurs appreciated Respiratory: can speak in full sentences, on 2L [place]; poor air movement throughout with end exp wheezes Abdomen: soft, thin, +BS, nontender. Neuro: A&Ox3, no focal deficits Psych: appropriate mood and affect. Normal speech and thought content.   Laboratory:  Recent Labs Lab 09/06/14 1830 09/07/14 1748  WBC 7.1 8.4  HGB 12.1 12.0  HCT 37.5 37.0  PLT 213 223    Recent Labs Lab 09/06/14 1830 09/07/14 1748 09/09/14 0210  NA 138 137 137  K 3.5 4.3 3.9  CL 100* 97* 100*  CO2 32 30 31  BUN 7 13 15   CREATININE 0.43* 0.51 0.48  CALCIUM 8.6* 9.4 8.8*  PROT 5.6*  --   --   BILITOT 0.3  --   --   ALKPHOS 66  --   --   ALT 25  --   --   AST 22  --   --   GLUCOSE 214* 346* 206*    Imaging/Diagnostic Tests: Dg Esophagus  09/09/2014   CLINICAL DATA:  Food getting stuck in the lower chest for 2 days. Admitted to unit for  COPD exacerbation.  EXAM: ESOPHOGRAM/BARIUM SWALLOW  TECHNIQUE: Single contrast examination was performed using  thin barium.  FLUOROSCOPY TIME:  Radiation Exposure Index (as provided by the fluoroscopic device): 0.5 mGy  Number of Acquired Images:  1  COMPARISON:  None.  FINDINGS: Unfortunately, most images of the esophagram were saved fluoroscopic images, but these were not archived or available for review.  A single still image of the esophagus was obtained, showing patency to the stomach. During real-time fluoroscopy, there was no impediment to passage of oral contrast in the normal diameter esophagus. The primary propulsion wave was strong in the upright position.  Note that the patient's extreme shortness of breath precluded recumbent positioning or standing for more than a minute. Sitting was not a possibility due to limitations of the fluoroscopy machine. If symptoms persist, study should be repeated when clinically able.  IMPRESSION: 1. Significantly limited study due to shortness of breath and inability to tolerate positioning. 2. Patent esophagus with no explanation for symptoms. If symptoms persist, need repeat in the outpatient setting.   Electronically Signed   By: Monte Fantasia M.D.   On: 09/09/2014 15:12    Leone Brand, MD 09/12/2014, 8:27 AM PGY-3, [place] Intern pager: [phone], text pages welcome

## 2014-09-13 LAB — GLUCOSE, CAPILLARY
GLUCOSE-CAPILLARY: 244 mg/dL — AB (ref 65–99)
Glucose-Capillary: 107 mg/dL — ABNORMAL HIGH (ref 65–99)
Glucose-Capillary: 213 mg/dL — ABNORMAL HIGH (ref 65–99)
Glucose-Capillary: 172 mg/dL — ABNORMAL HIGH (ref 65–99)

## 2014-09-13 NOTE — Progress Notes (Signed)
Speech Language Pathology Treatment: Dysphagia  Patient Details Name: Judith Boyer MRN: [id number] DOB: [birth year] Today's Date: 09/13/2014 Time: 1505-6979 SLP Time Calculation (min) (ACUTE ONLY): 15 min  Assessment / Plan / Recommendation Clinical Impression  ST follow up for therapeutic diet tolerance and results of testing.  The patient had an esophgram on Friday without significant findings however, the study was limited due to issues with positioning during the study.  The patient reports that her respiratory status is baseline.  She reports no further issues with regurgitation of food.  Currently, she has a decreased appetite.  Meal observation was completed without any overt issues noted.  Discussed strategies to facilitate esophageal clearance with the patient.  Recommend ST f/u x1 for resolution of her acute respiratory issues.     HPI Other Pertinent Information: Judith Boyer is a 69 y.o. female presenting with shortness of breath. PMH is significant for COPD with recent exacerbation requiring hospitalization in July, HTN, CAD, HLD, T2DM. She reports that food feels like it is getting stuck and coming back up.    Pertinent Vitals    SLP Plan  Continue with current plan of care    Recommendations Diet recommendations: Regular;Thin liquid Liquids provided via: Straw;Cup Medication Administration: Whole meds with puree Supervision: Patient able to self feed Compensations: Small sips/bites;Follow solids with liquid;Slow rate (hot beverage with meals) Postural Changes and/or Swallow Maneuvers: Seated upright 90 degrees;Upright 30-60 min after meal              Plan: Continue with current plan of care    GO     Lamar Sprinkles 09/13/2014, 9:52 AM Shelly Flatten, [place], [facility] [facility] SLP [phone]

## 2014-09-13 NOTE — Care Management Important Message (Signed)
Important Message  Patient Details  Name: Judith Boyer MRN: [id number] Date of Birth: 10/07/1945   Medicare Important Message Given:  Yes-third notification given    Pricilla Handler 09/13/2014, 2:49 PM

## 2014-09-13 NOTE — Progress Notes (Signed)
Family Medicine Teaching Service Daily Progress Note Intern Pager: [phone]  Patient name: Judith Boyer Medical record number: [id number] Date of birth: Jan 31, 1946 Age: 69 y.o. Gender: female  Primary Care Provider: Jenny Reichmann, MD Consultants: CCM, pulmonology, palliative  Code Status: Full  Chief Complaint: shortness of breath  Assessment and Plan: Judith Boyer is a 69 y.o. female presenting with shortness of breath. PMH is significant for COPD with recent exacerbation requiring hospitalization last month, HTN, CAD, HLD, T2DM  # COPD exacerbation: high 90s-100% O2 sat on 2L [place]. Tachycardic, RR upper limits of normal. Has not yet required BiPAP. Palliative care has suggested low dose morphine for symptomatic relief. Patient continues to decline, saying that she has family members and friends have taken morphine in the past and have "gone crazy." CSW consulted to discuss affording new meds outpatient.  - Cont duonebs q4hrs scheduled, albuterol neb q2 hrs PRN, prednisone 60mg  daily, levaquin 750mg  q48 hours, pulmicort, singulair, daliresp - Continuous O2 keep sat >92% - Monitor respiratory status closely - Low threshold for BiPAP  - Per pulmonology - BID budesonide with at least 4 times a day duoneb upon discharge, Brovana BID outpatient  # Tachycardia: suspected from albuterol treatments. Low 100s.  - Monitor  # T2DM: CBGs 104-292 in past 24 hr. Received 4U Novolog yesterday. Will need close outpatient follow-up to adjust meds during steroid taper after discharge. - CBG qAC qHS - Cont Lantus 10U for basal coverage - Add SSI (thin, adjust as needed).   # HTN: BP 115s-130s/50s. - Will hold bisoprolol for now - Cont irbesartan  # Anxiety: Stable. Patient believes Zoloft is not improving her anxiety/mood and is making her not want to eat. She would like to stop taking that and does not want to start another anti-depressant.  - Cont Xanax TID - Discontinue Zoloft per  patient request   # CAD: stable - Continue home plavix  # HLD: stable - Continue pravastatin  # Anemia: on iron tabs at home - D/c home iron given current levoquin treatment   # Dysphagia: Barium swallow showed patent esophagus with no explanation for symptoms.  - If symptoms persist, consider outpatient follow-up for repeat swallow   FEN/GI: diet carb mod / saline lock Prophylaxis: heparin   Disposition: potential home with home health today (08/08)  Subjective:  Judith Boyer says that her breathing has improved today, but she does not feel like she is able to go home today. She would like to stay another day for monitoring. She has been seen by palliative care multiple times, and is now asking them to leave her alone. She does not want low dose morphine suggested by palliative for symptomatic relief, saying that she has friends and family that have "gone crazy" after taking morphine. She also does not want to speak with her family about her code status, although she says she realizes the severity of her condition. She reports lack of appetite, and says she thinks it is due to Zoloft. She wants to stop taking Zoloft and does not want to begin another anti-depressant in its place.   Objective: Temp:  [98.4 F (36.9 C)-98.7 F (37.1 C)] 98.4 F (36.9 C) (08/08 1424) Pulse Rate:  [101-116] 106 (08/08 1424) Resp:  [18-19] 18 (08/08 1424) BP: (113-135)/(45-56) 113/45 mmHg (08/08 1424) SpO2:  [94 %-100 %] 100 % (08/08 1424) Weight:  [97 lb (43.999 kg)] 97 lb (43.999 kg) (08/08 0531) Physical Exam: General: elderly lady sitting on edge of  bed eating breakfast Cardiovascular: tachycardic, regular rhythm, no murmurs appreciated Respiratory: mostly speaking in full sentences, occasional breathing through pursed lips, wearing 2L   Abdomen: soft, non-tender, non-distended, +BS Neuro: A&Ox3, no focal deficits Psych: appropriate mood and affect   Laboratory:  Recent Labs Lab  09/06/14 1830 09/07/14 1748  WBC 7.1 8.4  HGB 12.1 12.0  HCT 37.5 37.0  PLT 213 223    Recent Labs Lab 09/06/14 1830 09/07/14 1748 09/09/14 0210  NA 138 137 137  K 3.5 4.3 3.9  CL 100* 97* 100*  CO2 32 30 31  BUN 7 13 15   CREATININE 0.43* 0.51 0.48  CALCIUM 8.6* 9.4 8.8*  PROT 5.6*  --   --   BILITOT 0.3  --   --   ALKPHOS 66  --   --   ALT 25  --   --   AST 22  --   --   GLUCOSE 214* 346* 206*    Imaging/Diagnostic Tests: No results found.  Judith Mould, MD 09/13/2014, 4:16 PM PGY-1, [place] Intern pager: [phone], text pages welcome

## 2014-09-13 NOTE — Progress Notes (Addendum)
Name: Judith Boyer MRN: [id number] DOB: [birth year]    ADMISSION DATE:  09/07/2014 CONSULTATION DATE:  8/4  REFERRING MD :  Hensel   CHIEF COMPLAINT:  COPD and chronic respiratory failure   BRIEF PATIENT DESCRIPTION:  65 yof w/ GOLD D COPD and chronic resp failure on 2 liters admitted 8/2 w/ AECOPD. PCCM asked to see on 8/5 for further recommendations re: symptom therapy.   SIGNIFICANT EVENTS    STUDIES:     HISTORY OF PRESENT ILLNESS:   This is a 69 year old female w/ chronic respiratory failure on the basis of GOLD D COPD. She is chronically on 2 liters of oxygen at baseline. She states that over the last 2 years she has had significant decline which has included ~ a 10 lb wt loss (now down to ~ 94 lbs), and worsening activity tolerance to the point she would consider walking the distance of her one floor house w/out stopping a good day. Most recently she was admitted on 7/8 for an exacerbation of her COPD. At that time she was treated w/ abx and pred taper. She was discharged to home w/ out-pt follow-up at which time her taper was extended. She noted that she felt a little better after d/c but never really returned to baseline and as the steroid taper continued her respiratory status continued to worsen. She was seen once again on 8/1 in the ER for progressive respiratory distress. Given steroids and SABA, then referred to her primary the next day. When she was seen she was again admitted for AECOPD. Since admit she has been treated in the usual fashion which included: oxygen, scheduled BDs and steroid taper. PCCM was asked to see on 8/5 for further recommendations.    SUBJECTIVE:  Remains dyspneic Marginally better She has concerns about using morphine for dyspnea  VITAL SIGNS: Temp:  [97.9 F (36.6 C)-98.7 F (37.1 C)] 98.5 F (36.9 C) (08/08 0531) Pulse Rate:  [101-116] 101 (08/08 0531) Resp:  [18-19] 19 (08/08 0531) BP: (132-136)/(50-56) 135/56 mmHg (08/08  0531) SpO2:  [94 %-100 %] 98 % (08/08 1113) Weight:  [43.999 kg (97 lb)] 43.999 kg (97 lb) (08/08 0531)  PHYSICAL EXAMINATION: General:  Frail 69 year old female sitting up  Neuro:  Awake, anxious no focal def  HEENT:  [place] no JVD  Cardiovascular:  rrr Lungs:  Pursed lip breathing poor air movement , no rhonchi Abdomen:  Soft, + bowel sounds Musculoskeletal:  Intact  Skin:  Intact    Recent Labs Lab 09/06/14 1830 09/07/14 1748 09/09/14 0210  NA 138 137 137  K 3.5 4.3 3.9  CL 100* 97* 100*  CO2 32 30 31  BUN 7 13 15   CREATININE 0.43* 0.51 0.48  GLUCOSE 214* 346* 206*    Recent Labs Lab 09/06/14 1830 09/07/14 1748  HGB 12.1 12.0  HCT 37.5 37.0  WBC 7.1 8.4  PLT 213 223   No results found.  ASSESSMENT / PLAN:  Chronic respiratory failure  AECOPD End stage COPD (GOLD D) Severe anxiety  Depression  FTT  Protein calorie malnutrition  Ms Vazquez has end-stage COPD w/ really little to offer on what is essentially maximized therapy given that she has had allergic reaction to nebulized formoterol and severe urinary retention w/ spiriva. She is not moving much air and continues to have what appears to be significant air trapping. It would appear as though steroids have had some effectiveness w/ her symptoms as when she decreases them  her symptoms worsen. Also her anxiety and reflux are also contributing factors. We have had a long discussion about the typical disease progression of COPD, including life support. She states she does not want to suffer on a ventilator machine, but wanted to speak about this with her children.    Plan continue twice a day budesonide with at least 4 times a day duoneb nebulized as outpatient. Cont budesonide (doubt she can effectively take advair) Brovana twice daily can be added as outpt Add flutter ct daliresp (worth a try given the severity of her symptoms) Cont reflux precautions Cont TID PRN xanax, she would like to defer morphine for  now-but this can be rediscussed as outpatient if she continues to remain dyspneic Keep her on four-week prednisone taper.   She will need outpatient follow-up with Dr Melvyn Novas PCCM available as needed  Kara Mead MD. [facility]. [place] [facility] Pager [phone] If no response call [phone]   09/13/2014, 11:45 AM

## 2014-09-13 NOTE — Progress Notes (Signed)
Daily Progress Note   Patient Name: Judith Boyer       Date: 09/13/2014 DOB: [birth year]  Age: 69 y.o. MRN#: [id number] Attending Physician: Zenia Resides, MD Primary Care Physician: Jenny Reichmann, MD Admit Date: 09/07/2014  Reason for Consultation/Follow-up: Establishing goals of care  Subjective: Judith Boyer is a 69 year old female w/ chronic respiratory failure/COPD on 2 liters of oxygen at baseline. She was admitted on 7/8 for an exacerbation of her COPD and reports that she never really returned to baseline. She was seen once again on 8/1 in the ER for progressive respiratory distress. When she was seen by PCP she was admitted for AECOPD. Pulm has seen her made recs for changes to her regimen yesterday. Palliative was consulted for discussion of goals of care in light of chronic progression of her disease.   Interval Events: Met with patient this morning. She continues to report frustration with her perceived lack of progress. She still remains reluctant to speak with me and is very short with answers to any questions.  Attempted to discuss further her feelings on how she has been doing this hospitalization. Initially, she reported that she didn't feel as though anything has changed since she had been here. I attempted to reflect this back to her, and asked if she felt coming to the hospital is helpful to her in light of her progressing disease. She then replied "that's not when I said" and declined to talk further about this.   She did note that she felt she was going to be "thrown out of the hospital" soon. I tried to assure her that when she is discharged, it will be with as good of a plan as we can arrange with her safety in mind. I attempted to use this opportunity to discuss the benefits that she could receive through home hospice enrollment in order to make sure she is well supported at home. She again stated she wanted to talk to her family about hospice and her CODE STATUS,  but not has not done so at this point. She reported she was done talking about these things today.  She did endorse feeling as though she was short of breath. I discussed with her again today the use of low-dose opioids for symptomatic relief of dyspnea. She had just taken her Xanax prior to my arrival reports she feels that this will be more helpful than taking opioid medications. I tried to explain that opioids in low doses often provide good symptomatic relief for dyspnea and allow patients to become more functional. She again stated she is not interested at this time.  Length of Stay: 6 days  Current Medications: Scheduled Meds:  . ALPRAZolam  0.25 mg Oral QHS  . budesonide (PULMICORT) nebulizer solution  0.5 mg Nebulization BID  . clopidogrel  75 mg Oral q morning - 10a  . guaiFENesin  600 mg Oral Daily  . heparin  5,000 Units Subcutaneous 3 times per day  . insulin aspart  0-5 Units Subcutaneous QHS  . insulin aspart  0-9 Units Subcutaneous TID WC  . insulin aspart  4 Units Subcutaneous TID WC  . insulin glargine  10 Units Subcutaneous Daily  . ipratropium-albuterol  3 mL Nebulization Q4H  . irbesartan  75 mg Oral Daily  . levofloxacin  750 mg Oral Q48H  . montelukast  10 mg Oral Q supper  . pravastatin  80 mg Oral QPM  . predniSONE  60 mg Oral Q  breakfast  . roflumilast  500 mcg Oral Daily  . sodium chloride  3 mL Intravenous Q12H    Continuous Infusions:    PRN Meds: sodium chloride, acetaminophen **OR** acetaminophen, albuterol, ALPRAZolam, bisacodyl, sodium chloride  Palliative Performance Scale: 40%     Vital Signs: BP 135/56 mmHg  Pulse 101  Temp(Src) 98.5 F (36.9 C) (Oral)  Resp 19  Ht 5' 1.5" (1.562 m)  Wt 43.999 kg (97 lb)  BMI 18.03 kg/m2  SpO2 98% SpO2: SpO2: 98 % O2 Device: O2 Device: Nasal Cannula O2 Flow Rate: O2 Flow Rate (L/min): 2 L/min  Intake/output summary:   Intake/Output Summary (Last 24 hours) at 09/13/14 1359 Last data filed at  09/13/14 0925  Gross per 24 hour  Intake    600 ml  Output    300 ml  Net    300 ml   LBM:   Baseline Weight: Weight: 44.453 kg (98 lb) Most recent weight: Weight: 43.999 kg (97 lb)  Physical Exam: General: elderly female sitting in bed; pursed lips breathing Cardiovascular: RRR, no murmurs appreciated Respiratory: breathing through pursed lips, does not speak in full sentences, [place] in place, prolonged expiratory phase and wheezing noted Abdomen: soft, non-tender, non-distended, +BS Neuro: moves 4 extremities, no focal deficits Psych: appropriate mood and affect               Additional Data Reviewed: No results for input(s): WBC, HGB, PLT, NA, BUN, CREATININE, ALB in the last 72 hours.   Problem List:  Patient Active Problem List   Diagnosis Date Noted  . Acute on chronic respiratory failure with hypoxia   . Palliative care by specialist   . Dysphagia   . Dyspnea 08/13/2014  . Tachycardia   . Respiratory acidosis   . Coronary artery disease involving native coronary artery of native heart without angina pectoris   . Type 2 diabetes mellitus without complication   . Anxiety   . COPD with acute exacerbation 06/18/2014  . B12 deficiency 02/10/2014  . Protein-calorie malnutrition, severe 02/09/2014  . Acute respiratory distress 02/08/2014  . Vitamin B 12 deficiency 06/28/2013  . Palliative care encounter 02/25/2013  . Weakness generalized 02/25/2013  . COPD exacerbation 02/20/2013  . CAP (community acquired pneumonia) 02/20/2013  . Anemia 02/20/2013  . Nocturnal hypoxemia 12/30/2012  . Chest pain at rest 02/09/2011  . CAD in native artery 02/09/2011  . COPD GOLD III 02/06/2011  . Smoker 02/06/2011  . Hypertension 02/06/2011  . Diabetes mellitus type 2, controlled, without complications 25/00/3704     Palliative Care Assessment & Plan    Code Status:  Full code  Goals of Care:  Patient reports that she has not discussed with her family and does not want to  discuss. She has been very reluctant to have any conversations regarding her current care plan, her disposition plan, and her long-term wishes including her CODE STATUS.  Symptom Management:  Dyspnea: Patient reports feeling dyspneic today. Attempted to talk with her about use of low-dose opioids for symptomatic management. She reports that she is uninterested in this intervention.  Would recommend initiation of low-dose morphine for symptomatic relief, however patient reports that she has had friends and family have adverse reactions to opioids in the past and is not interested.   Anxiety: Patient appeared acutely anxious during my visit today.  She has been started on an SSRI. Agree with continuation of Xanax at low dose on as-needed basis with continued close monitoring.  Psycho-social/Spiritual:  Desire  for further Chaplaincy support:no   Prognosis: < 6 months Discharge Planning: Pending   Care plan was discussed with patient and Dr. Lamar Benes  Thank you for allowing the Palliative Medicine Team to assist in the care of this patient.   Time In: 0800 Time Out: 0820 Total Time 25 Prolonged Time Billed  no     Greater than 50%  of this time was spent counseling and coordinating care related to the above assessment and plan.   Micheline Rough, MD  09/13/2014, 1:59 PM  Please contact Palliative Medicine Team phone at [phone] for questions and concerns.

## 2014-09-14 DIAGNOSIS — E43 Unspecified severe protein-calorie malnutrition: Secondary | ICD-10-CM

## 2014-09-14 LAB — GLUCOSE, CAPILLARY
Glucose-Capillary: 118 mg/dL — ABNORMAL HIGH (ref 65–99)
Glucose-Capillary: 186 mg/dL — ABNORMAL HIGH (ref 65–99)

## 2014-09-14 MED ORDER — IPRATROPIUM-ALBUTEROL 0.5-2.5 (3) MG/3ML IN SOLN: 3.0000 mL | RESPIRATORY_TRACT | Status: DC

## 2014-09-14 MED ORDER — ARFORMOTEROL TARTRATE 15 MCG/2ML IN NEBU: 15.0000 ug | INHALATION_SOLUTION | Freq: Two times a day (BID) | RESPIRATORY_TRACT | Status: DC

## 2014-09-14 MED ORDER — PREDNISONE 20 MG PO TABS: 20.0000 mg | ORAL_TABLET | Freq: Every day | ORAL | Status: DC

## 2014-09-14 MED ORDER — METFORMIN HCL 500 MG PO TABS: 1000.0000 mg | ORAL_TABLET | Freq: Two times a day (BID) | ORAL | Status: DC

## 2014-09-14 MED ORDER — BUDESONIDE 0.5 MG/2ML IN SUSP: 0.5000 mg | Freq: Two times a day (BID) | RESPIRATORY_TRACT | Status: DC

## 2014-09-14 MED ORDER — ALPRAZOLAM 0.25 MG PO TABS: 0.2500 mg | ORAL_TABLET | Freq: Three times a day (TID) | ORAL | Status: DC | PRN

## 2014-09-14 MED ORDER — ROFLUMILAST 500 MCG PO TABS: 500.0000 ug | ORAL_TABLET | Freq: Every day | ORAL | Status: DC

## 2014-09-14 NOTE — Progress Notes (Signed)
Daily Progress Note   Patient Name: Judith Boyer       Date: 09/14/2014 DOB: 1946/02/01  Age: 69 y.o. MRN#: [id number] Attending Physician: Zenia Resides, MD Primary Care Physician: Jenny Reichmann, MD Admit Date: 09/07/2014  Reason for Consultation/Follow-up: Establishing goals of care  Subjective: Ms. Bonillas is a 69 year old female w/ chronic respiratory failure/COPD on 2 liters of oxygen at baseline. She was admitted on 7/8 for an exacerbation of her COPD and reports that she never really returned to baseline. She was seen once again on 8/1 in the ER for progressive respiratory distress. When she was seen by PCP she was admitted for AECOPD. Pulm has seen her made recs for changes to her regimen yesterday. Palliative was consulted for discussion of goals of care in light of chronic progression of her disease.   Interval Events: Met with Ms. Khouri this morning to discuss how she is feeling this morning as well as plan for discharge home.  She reports being anxious about thought of leaving the hospital. I shared with her again my thoughts that she may benefit and have better symptom management at home with the use of low-dose opioids. She reports she will discuss this further with her primary care doctor if she feels it will be beneficial after discharge. I also shared with her again that we want to work closely with her to make sure that we are focusing on her goal of feeling as good as possible and being out of the hospital. We specifically discussed again the goal of hospice being to live as well as possible and remain in her home. She does report this is something she may need to consider in the near future and she will discuss further with her primary care doctor on follow-up.  Length of Stay: 7 days  Current Medications: Scheduled Meds:  . ALPRAZolam  0.25 mg Oral QHS  . budesonide (PULMICORT) nebulizer solution  0.5 mg Nebulization BID  . clopidogrel  75 mg Oral q morning -  10a  . guaiFENesin  600 mg Oral Daily  . heparin  5,000 Units Subcutaneous 3 times per day  . insulin aspart  0-5 Units Subcutaneous QHS  . insulin aspart  0-9 Units Subcutaneous TID WC  . insulin aspart  4 Units Subcutaneous TID WC  . insulin glargine  10 Units Subcutaneous Daily  . ipratropium-albuterol  3 mL Nebulization Q4H  . irbesartan  75 mg Oral Daily  . levofloxacin  750 mg Oral Q48H  . montelukast  10 mg Oral Q supper  . pravastatin  80 mg Oral QPM  . predniSONE  60 mg Oral Q breakfast  . roflumilast  500 mcg Oral Daily  . sodium chloride  3 mL Intravenous Q12H    Continuous Infusions:    PRN Meds: sodium chloride, acetaminophen **OR** acetaminophen, albuterol, ALPRAZolam, bisacodyl, sodium chloride  Palliative Performance Scale: 40%     Vital Signs: BP 137/56 mmHg  Pulse 102  Temp(Src) 98.1 F (36.7 C) (Oral)  Resp 18  Ht 5' 1.5" (1.562 m)  Wt 42.366 kg (93 lb 6.4 oz)  BMI 17.36 kg/m2  SpO2 99% SpO2: SpO2: 99 % O2 Device: O2 Device: Nasal Cannula O2 Flow Rate: O2 Flow Rate (L/min): 2 L/min  Intake/output summary:   Intake/Output Summary (Last 24 hours) at 09/14/14 1209 Last data filed at 09/14/14 1144  Gross per 24 hour  Intake    720 ml  Output  0 ml  Net    720 ml   LBM:   Baseline Weight: Weight: 44.453 kg (98 lb) Most recent weight: Weight: 42.366 kg (93 lb 6.4 oz) (scale a)  Physical Exam: General: elderly female sitting in bed; pursed lips breathing but appears more comfortable overall today Cardiovascular: RRR, no murmurs appreciated Respiratory: breathing through pursed lips, does speak in nearly complete sentences, [place] in place, prolonged expiratory phase and wheezing noted Abdomen: soft, non-tender, non-distended, +BS Neuro: moves 4 extremities, no focal deficits Psych: appropriate mood and affect               Additional Data Reviewed: No results for input(s): WBC, HGB, PLT, NA, BUN, CREATININE, ALB in the last 72  hours.   Problem List:  Patient Active Problem List   Diagnosis Date Noted  . Acute on chronic respiratory failure with hypoxia   . Palliative care by specialist   . Dysphagia   . Dyspnea 08/13/2014  . Tachycardia   . Respiratory acidosis   . Coronary artery disease involving native coronary artery of native heart without angina pectoris   . Type 2 diabetes mellitus without complication   . Anxiety   . COPD with acute exacerbation 06/18/2014  . B12 deficiency 02/10/2014  . Protein-calorie malnutrition, severe 02/09/2014  . Acute respiratory distress 02/08/2014  . Vitamin B 12 deficiency 06/28/2013  . Palliative care encounter 02/25/2013  . Weakness generalized 02/25/2013  . COPD exacerbation 02/20/2013  . CAP (community acquired pneumonia) 02/20/2013  . Anemia 02/20/2013  . Nocturnal hypoxemia 12/30/2012  . Chest pain at rest 02/09/2011  . CAD in native artery 02/09/2011  . COPD GOLD III 02/06/2011  . Smoker 02/06/2011  . Hypertension 02/06/2011  . Diabetes mellitus type 2, controlled, without complications 13/09/6576     Palliative Care Assessment & Plan    Code Status:  Full code  Goals of Care:  Patient reports that she has not discussed with her family and does not want to discuss. She has been very reluctant to have any conversations regarding her current care plan, her disposition plan, and her long-term wishes including her CODE STATUS. I shared with her again that I believe we should focus on medical treatments that are likely to allow her to achieve her goals of avoiding treatments that are unlikely to get her to her goals of being home and as comfortable as possible.  She reports she has plans to follow with her primary care physician in order to discuss further  Symptom Management:  Dyspnea: Patient reports feeling dyspneic today. Spoke briefly about use of low-dose opioids for symptomatic management. She reports that she is uninterested in this  intervention.  Would recommend initiation of low-dose morphine for symptomatic relief, however patient reports that she has had friends and family have adverse reactions to opioids in the past and is not interested.   Anxiety: Patient appeared acutely anxious during my visit today.  She has been started on an SSRI. Agree with continuation of Xanax at low dose on as-needed basis with continued close monitoring.  Psycho-social/Spiritual:  Desire for further Chaplaincy support:no   Prognosis: < 6 months Discharge Planning: Home   Care plan was discussed with patient.  Thank you for allowing the Palliative Medicine Team to assist in the care of this patient.   Time In: 0815 Time Out: 0835 Total Time 20 Prolonged Time Billed  no     Greater than 50%  of this time was spent counseling and coordinating  care related to the above assessment and plan.   Micheline Rough, MD  09/14/2014, 12:09 PM  Please contact Palliative Medicine Team phone at [phone] for questions and concerns.

## 2014-09-14 NOTE — Progress Notes (Signed)
Inpatient Diabetes Program Recommendations  AACE/ADA: New Consensus Statement on Inpatient Glycemic Control (2013)  Target Ranges:  Prepandial:   less than 140 mg/dL      Peak postprandial:   less than 180 mg/dL (1-2 hours)      Critically ill patients:  140 - 180 mg/dL   Results for CYSTAL, SHANNAHAN (MRN [id number]) as of 09/14/2014 08:16  Ref. Range 09/13/2014 06:25 09/13/2014 11:08 09/13/2014 16:06 09/13/2014 21:18 09/14/2014 06:05  Glucose-Capillary Latest Ref Range: 65-99 mg/dL 107 (H) 213 (H) 244 (H) 172 (H) 118 (H)   Inpatient Diabetes Medications: Lantus 10 units daily, Novolog 4 units TID with meals for meal coverage, Novolog 0-9 units TID with meals, Novolog 0-5 units HS  Inpatient Diabetes Program Recommendations Insulin - Meal Coverage: NURSING: Please administer meal coverage if ordered parameters are met (Administer if patient eating 50 % or more of meal; Do NOT give if premeal CBG < 80 or if patient is on premixed insulin).  Thanks, Barnie Alderman, RN, MSN, CCRN, CDE Diabetes Coordinator Inpatient Diabetes Program [phone] (Team Pager from [place] to [place]) [phone] ([place] office) [phone] [facility] office) [phone] [facility] office)

## 2014-09-14 NOTE — Progress Notes (Signed)
At 1558 all d/c instructions explained and given to pt.  Verbalized understanding.  Pt d/c to awaiting transport to home.  Karie Kirks, Therapist, sports.

## 2014-09-14 NOTE — Progress Notes (Signed)
Patient is active with Jennette for [facility] as prior to admission. Marie with [facility] called concerning dc home today for resumption of services; Aneta Mins [phone]

## 2014-09-14 NOTE — Progress Notes (Signed)
Name: Judith Boyer MRN: [id number] DOB: 06/19/1945    ADMISSION DATE:  09/07/2014 CONSULTATION DATE:  8/4  REFERRING MD :  Hensel   CHIEF COMPLAINT:  COPD and chronic respiratory failure   BRIEF PATIENT DESCRIPTION:  69 yof w/ GOLD D COPD and chronic resp failure on 2 liters admitted 8/2 w/ AECOPD. PCCM asked to see on 8/5 for further recommendations re: symptom therapy.   HISTORY OF PRESENT ILLNESS:   This is a 69 year old female w/ chronic respiratory failure on the basis of GOLD D COPD. She is chronically on 2 liters of oxygen at baseline. She states that over the last 2 years she has had significant decline which has included ~ a 10 lb wt loss (now down to ~ 94 lbs), and worsening activity tolerance to the point she would consider walking the distance of her one floor house w/out stopping a good day. Most recently she was admitted on 7/8 for an exacerbation of her COPD. At that time she was treated w/ abx and pred taper. She was discharged to home w/ out-pt follow-up at which time her taper was extended. She noted that she felt a little better after d/c but never really returned to baseline and as the steroid taper continued her respiratory status continued to worsen. She was seen once again on 8/1 in the ER for progressive respiratory distress. Given steroids and SABA, then referred to her primary the next day. When she was seen she was again admitted for AECOPD. Since admit she has been treated in the usual fashion which included: oxygen, scheduled BDs and steroid taper. PCCM was asked to see on 8/5 for further recommendations.    SUBJECTIVE:  Marginally improved dyspnea   VITAL SIGNS: Temp:  [97.5 F (36.4 C)-98.4 F (36.9 C)] 98.1 F (36.7 C) (08/09 0538) Pulse Rate:  [102-106] 102 (08/09 0538) Resp:  [18] 18 (08/09 0538) BP: (111-137)/(45-56) 137/56 mmHg (08/09 0538) SpO2:  [98 %-100 %] 99 % (08/09 1150) Weight:  [93 lb 6.4 oz (42.366 kg)] 93 lb 6.4 oz (42.366 kg) (08/09  0538)  PHYSICAL EXAMINATION: General:  Frail  female sitting up  Neuro:  Alert, interactive, anxious no focal def  HEENT:  [place] no JVD  Cardiovascular:  rrr Lungs:  Pursed lip breathing poor air movement , no rhonchi Abdomen:  Soft, + bowel sounds Musculoskeletal:  Intact  Skin:  Intact    Recent Labs Lab 09/07/14 1748 09/09/14 0210  NA 137 137  K 4.3 3.9  CL 97* 100*  CO2 30 31  BUN 13 15  CREATININE 0.51 0.48  GLUCOSE 346* 206*    Recent Labs Lab 09/07/14 1748  HGB 12.0  HCT 37.0  WBC 8.4  PLT 223   No results found.  ASSESSMENT / PLAN:  Chronic respiratory failure  AECOPD End stage COPD (GOLD D) Severe anxiety  Depression  FTT  Protein calorie malnutrition  Ms Standre has end-stage COPD w/ really little to offer on what is essentially maximized therapy given that she has had allergic reaction to nebulized formoterol and severe urinary retention w/ spiriva.  It would appear as though steroids have had some effectiveness w/ her symptoms as when she decreases them her symptoms worsen. Also her anxiety and reflux are also contributing factors. We have had a long discussion about the typical disease progression of COPD, including life support. She states she does not want to suffer on a ventilator machine, but wanted to speak about this  with her children.    Plan Resume Advair as outpatient with at least 4 times a day duoneb - she has enough supplies of Advair currently & prefers this to budesonide + brovana regimen ct daliresp (worth a try given the severity of her symptoms) Cont reflux precautions Cont TID PRN xanax, she would like to defer morphine for now-but this can be rediscussed as outpatient if she continues to remain dyspneic Keep her on four-week prednisone taper.  She will need outpatient follow-up with Dr Melvyn Novas She has [facility] visiting nurse services  Kara Mead MD. [facility] [facility]monary [facility] Critical [facility] Pager [phone] If no response call [phone]    09/14/2014, 12:12 PM

## 2014-09-14 NOTE — Discharge Instructions (Signed)
You were hospitalized for a COPD exacerbation. It is very important to make and attend hospital appointments with your primary provider, Dr. Everlene Farrier, and your pulmonologist, Dr. Melvyn Novas, within 1 week. Their phone numbers are provided in these discharge papers.   There have been changes to your medication list. For your COPD, please take the following prescriptions as prescribed: - Duonebs at least four times per day - Brovana two times a day - Pulmicort - Daliresp - Singulair - Prednisone - Xanax  You will NOT be resuming Advair. Also be sure to use the flutter device to help with your breathing.   It is important to decrease your prednisone over time. Here is the schedule: For the next two days (08/10-08/11) - 40 mg For the following two days (08/12-08/13) - 20 mg For the following five days (08/14-08/19)- 15 mg After this (beginning 08/20), begin taking 10 mg. Remain on this dose until Dr. Everlene Farrier or Dr. Melvyn Novas tells you to change the dose.

## 2014-09-14 NOTE — Consult Note (Signed)
   [facility] CM Inpatient Consult   09/14/2014  Judith Boyer July 22, 1945 [id number] Patient evaluated for community based chronic disease management services with [place] [facility] Program as a benefit of patient's [employer]. Patient is a COPD Gold patient and was receiving [facility] Care Management calls prior to admission.  Spoke with patient at bedside to explain [place] Management services. Consent form signed.  Patient will receive post discharge transition of care call and will be evaluated for monthly home visits for assessments and disease process education.  Left contact information and [facility] literature at bedside. Made Inpatient Case Manager aware that [place] Management following. Of note, [facility] Care Management services does not replace or interfere with any services that are arranged by inpatient case management or social work.  For additional questions or referrals please contact:   Natividad Brood, RN BSN [facility] Liaison  [phone] business mobile phone

## 2014-09-14 NOTE — Progress Notes (Signed)
SLP Cancellation Note  Patient Details Name: Judith Boyer MRN: [id number] DOB: [birth year]   Cancelled treatment:       Reason Eval/Treat Not Completed: Other (comment) (Pt in process of discharging, declined tx). Asked pt if having any trouble or regurgitation with meals, reported no issues for the past day. Has been taking pills with puree successfully.   Kern Reap, [place], CCC-SLP 09/14/2014, 3:56 PM [phone]

## 2014-09-15 ENCOUNTER — Other Ambulatory Visit: Payer: Self-pay

## 2014-09-15 ENCOUNTER — Telehealth: Payer: Self-pay | Admitting: Emergency Medicine

## 2014-09-15 DIAGNOSIS — Z9981 Dependence on supplemental oxygen: Secondary | ICD-10-CM | POA: Diagnosis not present

## 2014-09-15 DIAGNOSIS — E119 Type 2 diabetes mellitus without complications: Secondary | ICD-10-CM | POA: Diagnosis not present

## 2014-09-15 DIAGNOSIS — E785 Hyperlipidemia, unspecified: Secondary | ICD-10-CM | POA: Diagnosis not present

## 2014-09-15 DIAGNOSIS — J441 Chronic obstructive pulmonary disease with (acute) exacerbation: Secondary | ICD-10-CM | POA: Diagnosis not present

## 2014-09-15 DIAGNOSIS — Z7952 Long term (current) use of systemic steroids: Secondary | ICD-10-CM | POA: Diagnosis not present

## 2014-09-15 DIAGNOSIS — Z87891 Personal history of nicotine dependence: Secondary | ICD-10-CM | POA: Diagnosis not present

## 2014-09-15 DIAGNOSIS — F419 Anxiety disorder, unspecified: Secondary | ICD-10-CM | POA: Diagnosis not present

## 2014-09-15 DIAGNOSIS — E539 Vitamin B deficiency, unspecified: Secondary | ICD-10-CM | POA: Diagnosis not present

## 2014-09-15 DIAGNOSIS — I2510 Atherosclerotic heart disease of native coronary artery without angina pectoris: Secondary | ICD-10-CM | POA: Diagnosis not present

## 2014-09-15 DIAGNOSIS — I252 Old myocardial infarction: Secondary | ICD-10-CM | POA: Diagnosis not present

## 2014-09-15 DIAGNOSIS — I10 Essential (primary) hypertension: Secondary | ICD-10-CM | POA: Diagnosis not present

## 2014-09-15 NOTE — Telephone Encounter (Signed)
Make the appt on tues day at about 1

## 2014-09-15 NOTE — Telephone Encounter (Signed)
Dr. Everlene Farrier, this patient needs to be seen for a hospital follow up within 7-10 days (was sent to the ER at her last OV). She wants to be seen by appointment only, she states that she is very sick and cannot come to the walk in clinic to see you. There are no openings, what can be done for her?  [phone]

## 2014-09-15 NOTE — Patient Outreach (Signed)
[facility]) Care Management  09/15/2014  LYNSEY ANGE November 01, 1945 [id number]   Referral from Natividad Brood, RN to assign Community RN, assigned Erenest Rasher, RN.  Thanks, Ronnell [place]. [place], [place] Assistant Phone: [phone] Fax: [phone]

## 2014-09-15 NOTE — Patient Outreach (Addendum)
[facility]) Care Management  09/15/2014  Judith Boyer [birth year] [id number]   Telephonic Care Management Note:  (Triage Screening)  Referral Date: 08/18/2014 Referral Source: MD Referral Issue: Diabetes Type 2 without complication, Shortness of breath, emphysema PCP: [name], MD - Last appt last week 7/16 and next appt 09/07/14. Home Health provider: [facility] DME: oxygen with [employer].  Admissions: [place] ED visits: 1 over past 6 months.   Outreach call #4.to patient.   H/O Admission:  09/07/14 - 09/14/14 - shortness of breath - # COPD exacerbation: on home 2L O2. Recurrent exacerbation with prior admission 08/09/2014. Patient reached and services introduced.  Patient confirms she has [facility] SN services.  States she has post hospital  follow-up appointment with Dr. Everlene Farrier for Tuesday, August 16th.   RN CM advised in Level 5 [facility] CM services.  Patient states she just does not feel well enough for a visit.   RN CM discussed goals of [facility] services and  this is the reason for services; is to assist with any care coordination needs and provide supportive services to avoid relapse and / or unnecessary readmission back to the hospital.   RN CM advised in plan of care for transition of care services post hospital discharge:    RN CM advised that [facility] SN services will remain in place and [facility] RN CM will assist with any care coordination needs.  Patient agreed to [facility] services and requested next follow-up call around the end of this week.  Plan:   RN CM notified [place] Assistant of case status update:  Agreed to services  - level 5 TOC RN CM sent referral to [facility] RN CM - Level 5 / TOC services  -H/O 3 admissions > past 6 months and 1 ED visit. -(Please see discharge summary for MDs follow-up recommendations.    Mariann Laster, RN, BSN, [facility], CCM  [facility] [employer] Management Coordinator [phone]  Direct [phone] Cell [phone] Office [phone] Fax

## 2014-09-15 NOTE — Telephone Encounter (Signed)
appt made

## 2014-09-16 DIAGNOSIS — J449 Chronic obstructive pulmonary disease, unspecified: Secondary | ICD-10-CM | POA: Diagnosis not present

## 2014-09-17 ENCOUNTER — Other Ambulatory Visit: Payer: Self-pay

## 2014-09-17 NOTE — Patient Outreach (Signed)
This RNCM was successful in making contact with patient for this initial telephone contact to assess needs for community care coordination. Patient identified himself using HIPPA identifiers by providng date of birth and address. Patient readily responded to assessment questions.  Patient and this RNCM collaborated to creat the attached care plan aimed at assisting with chronic disease management.  Patient acknowledges having an appointment with her primary care and pulmonologist next week.  Plan: Telephone contact on Thursday, August 18

## 2014-09-20 ENCOUNTER — Other Ambulatory Visit: Payer: Self-pay | Admitting: Emergency Medicine

## 2014-09-21 ENCOUNTER — Encounter: Payer: Self-pay | Admitting: Emergency Medicine

## 2014-09-21 ENCOUNTER — Other Ambulatory Visit: Payer: Self-pay | Admitting: Emergency Medicine

## 2014-09-21 ENCOUNTER — Ambulatory Visit (INDEPENDENT_AMBULATORY_CARE_PROVIDER_SITE_OTHER): Payer: Medicare Other | Admitting: Emergency Medicine

## 2014-09-21 VITALS — BP 145/65 | HR 127 | Temp 98.2°F | Resp 20

## 2014-09-21 DIAGNOSIS — R54 Age-related physical debility: Secondary | ICD-10-CM | POA: Diagnosis not present

## 2014-09-21 DIAGNOSIS — E119 Type 2 diabetes mellitus without complications: Secondary | ICD-10-CM

## 2014-09-21 DIAGNOSIS — J438 Other emphysema: Secondary | ICD-10-CM

## 2014-09-21 DIAGNOSIS — F411 Generalized anxiety disorder: Secondary | ICD-10-CM | POA: Diagnosis not present

## 2014-09-21 MED ORDER — ALPRAZOLAM 0.25 MG PO TABS: 0.2500 mg | ORAL_TABLET | Freq: Two times a day (BID) | ORAL | Status: DC

## 2014-09-21 MED ORDER — INSULIN GLARGINE 100 UNIT/ML SOLOSTAR PEN: 10.0000 [IU] | PEN_INJECTOR | Freq: Every day | SUBCUTANEOUS | Status: DC

## 2014-09-21 MED ORDER — GLUCOSE BLOOD VI STRP: ORAL_STRIP | Status: DC

## 2014-09-21 NOTE — Progress Notes (Signed)
This chart was scribed for Judith Queen, MD by Judith Boyer, Medical Scribe. This patient was seen in Room 21 and the patient's care was started at 1:23 PM.   Chief Complaint:  Chief Complaint  Patient presents with   hospital follow up    HPI: Judith Boyer is a 69 y.o. female with a medical history of COPD who reports to [facility] today for a hospital follow up. Pt was admitted to the hospital on 09/07/2014 for shortness of breath and hyperglycemia. Pt reports that the hospital did not change any of her medications. She notes that she was discharged with Prednisone 20 mg, however the specialist advised her to be on a 30 days regiment. She does report that her symptoms have improved. She indicates that she felt a lot stronger after her hospital discharge.  Pt indicates that her blood sugar has been running in the 300's range. She notes that she is trying to control her diet to improve her symptoms. Pt is requesting a prescription for Accu chek test strips. She also reports that 20 tablets a month of xanax are not enough for her, she is requesting a bigger refill.  Pt notes that she still has a nurse come form [facility] once a week, and she did get in touch with [place].   Pt reports that she has an appointment with Dr. Laurance Boyer tomorrow.    Past Medical History  Diagnosis Date   COPD (chronic obstructive pulmonary disease)    Hypertension    Coronary artery disease    Shortness of breath    High cholesterol    Myocardial infarction 02/2011   On home oxygen therapy     "2L; 24h/day" (02/08/2014)   Pneumonia     "couple times" (02/08/2014)   Type II diabetes mellitus     type 2   Anemia    History of blood transfusion 2014    "extremely anemic"   Past Surgical History  Procedure Laterality Date   Shoulder arthroscopy w/ rotator cuff repair Left    Left heart catheterization with coronary angiogram N/A 02/09/2011    Procedure: LEFT HEART  CATHETERIZATION WITH CORONARY ANGIOGRAM;  Surgeon: Judith Riddle, MD;  Location: [place] CATH LAB;  Service: Cardiovascular;  Laterality: N/A;   Tonsillectomy     Abdominal hysterectomy  1970's   Coronary angioplasty with stent placement  12/2007    "2"   Cardiac catheterization  02/2011   Social History   Social History   Marital Status: Widowed    Spouse Name: N/A   Number of Children: N/A   Years of Education: N/A   Social History Main Topics   Smoking status: Former Smoker -- 1.00 packs/day for 50 years    Types: Cigarettes    Quit date: 01/10/2013   Smokeless tobacco: Never Used   Alcohol Use: No   Drug Use: No   Sexual Activity: No   Other Topics Concern   None   Social History Narrative   Family History  Problem Relation Age of Onset   Heart disease Maternal Grandmother    Brain cancer Brother    Lung cancer Mother     smoked   Allergies  Allergen Reactions   Doxycycline Anaphylaxis   Codeine Other (See Comments)    hyperactivity   Dulera [Mometasone Furo-Formoterol Fum] Hives and Rash   Spiriva Handihaler [Tiotropium Bromide Monohydrate] Rash and Other (See Comments)    Rash and Urinary Retention  Adhesive [Tape] Rash   Latex Other (See Comments)    unknown   Lisinopril Cough   Prior to Admission medications   Medication Sig Start Date End Date Taking? Authorizing Provider  albuterol (PROAIR HFA) 108 (90 BASE) MCG/ACT inhaler Inhale 2 puffs into the lungs every 4 (four) hours as needed for wheezing or shortness of breath. 08/17/14   Judith Russian, MD  ALPRAZolam Duanne Moron) 0.25 MG tablet Take 1 tablet (0.25 mg total) by mouth 3 (three) times daily as needed for anxiety. 09/14/14   Judith Mould, MD  bisoprolol (ZEBETA) 5 MG tablet Take 1 tablet (5 mg total) by mouth daily. Patient not taking: Reported on 08/17/2014 06/21/14   Judith Hacker, MD  clopidogrel (PLAVIX) 75 MG tablet Take 1 tablet (75 mg total) by mouth every morning.  06/28/13   Judith Russian, MD  Fluticasone-Salmeterol (ADVAIR) 500-50 MCG/DOSE AEPB Inhale 1 puff into the lungs 2 (two) times daily. Patient not taking: Reported on 09/17/2014 01/14/14   Judith Russian, MD  guaiFENesin (MUCINEX) 600 MG 12 hr tablet Take 600 mg by mouth daily. Take every day per patient    Historical Provider, MD  ipratropium-albuterol (DUONEB) 0.5-2.5 (3) MG/3ML SOLN Take 3 mLs by nebulization every 4 (four) hours. 09/14/14   Judith Mould, MD  Iron TABS Take 1 tablet by mouth daily.    Historical Provider, MD  metFORMIN (GLUCOPHAGE) 500 MG tablet Take 2 tablets (1,000 mg total) by mouth 2 (two) times daily with a meal. 09/14/14   Judith Mould, MD  montelukast (SINGULAIR) 10 MG tablet take 1 tablet by mouth once daily Patient taking differently: take 1 tablet by mouth once every evening 07/09/14   Judith Russian, MD  nitroGLYCERIN (NITROSTAT) 0.4 MG SL tablet Place 1 tablet (0.4 mg total) under the tongue every 5 (five) minutes x 3 doses as needed for chest pain. 02/07/12   Judith Dials, MD  pravastatin (PRAVACHOL) 80 MG tablet Take 1 tablet (80 mg total) by mouth every evening. 06/28/13   Judith Russian, MD  predniSONE (DELTASONE) 20 MG tablet Take 1 tablet (20 mg total) by mouth daily with breakfast. 09/14/14   Judith Mould, MD  roflumilast (DALIRESP) 500 MCG TABS tablet Take 1 tablet (500 mcg total) by mouth daily. Patient not taking: Reported on 09/17/2014 09/14/14   Judith Mould, MD  valsartan (DIOVAN) 80 MG tablet Take 1 tablet (80 mg total) by mouth daily. Patient taking differently: Take 80 mg by mouth every evening.  02/22/14   Judith Heckle English, PA     ROS: The patient has shortness of breath denies. All other systems have been reviewed and were otherwise negative with the exception of those mentioned in the HPI and as above.    PHYSICAL EXAM: Filed Vitals:   09/21/14 1320  BP: 145/65  Pulse: 127  Temp: 98.2 F (36.8 C)  Resp: 20    There is no weight on file to calculate BMI.   General: Alert, no acute distress HEENT:  Normocephalic, atraumatic, oropharynx patent. Eye: Judith Boyer [facility] Cardiovascular:  Regular Regular tachycardia.  No pedal edema.  Respiratory: In no distress, but very diminish breath sounds in both bases with air exchange. No wheezes, rales, or rhonchi.  No cyanosis, no use of accessory musculature Abdominal: No organomegaly, abdomen is soft and non-tender, positive bowel sounds.  No masses. Musculoskeletal: Gait intact. No edema in lower extremities, tenderness Skin: No rashes. Neurologic: Facial musculature symmetric. Psychiatric:  Patient acts appropriately throughout our interaction. Lymphatic: No cervical or submandibular lymphadenopathy Genitourinary/Anorectal: No acute findings  SPO2 at 1:33 pm is 98%.   LABS: Results for orders placed or performed during the hospital encounter of 09/07/14  MRSA PCR Screening  Result Value Ref Range   MRSA by PCR NEGATIVE NEGATIVE  Basic metabolic panel  Result Value Ref Range   Sodium 137 135 - 145 mmol/L   Potassium 4.3 3.5 - 5.1 mmol/L   Chloride 97 (L) 101 - 111 mmol/L   CO2 30 22 - 32 mmol/L   Glucose, Bld 346 (H) 65 - 99 mg/dL   BUN 13 6 - 20 mg/dL   Creatinine, Ser 0.51 0.44 - 1.00 mg/dL   Calcium 9.4 8.9 - 10.3 mg/dL   GFR calc non Af Amer >60 >60 mL/min   GFR calc Af Amer >60 >60 mL/min   Anion gap 10 5 - 15  CBC  Result Value Ref Range   WBC 8.4 4.0 - 10.5 K/uL   RBC 4.04 3.87 - 5.11 MIL/uL   Hemoglobin 12.0 12.0 - 15.0 g/dL   HCT 37.0 36.0 - 46.0 %   MCV 91.6 78.0 - 100.0 fL   MCH 29.7 26.0 - 34.0 pg   MCHC 32.4 30.0 - 36.0 g/dL   RDW 12.5 11.5 - 15.5 %   Platelets 223 150 - 400 K/uL  Glucose, capillary  Result Value Ref Range   Glucose-Capillary 344 (H) 65 - 99 mg/dL  Glucose, capillary  Result Value Ref Range   Glucose-Capillary 128 (H) 65 - 99 mg/dL  Glucose, capillary  Result Value Ref Range   Glucose-Capillary 123  (H) 65 - 99 mg/dL   Comment 1 Document in Chart   Glucose, capillary  Result Value Ref Range   Glucose-Capillary 218 (H) 65 - 99 mg/dL   Comment 1 Document in Chart   Blood gas, arterial  Result Value Ref Range   O2 Content 2.0 L/min   Delivery systems NASAL CANNULA    pH, Arterial 7.389 7.350 - 7.450   pCO2 arterial 50.6 (H) 35.0 - 45.0 mmHg   pO2, Arterial 115 (H) 80.0 - 100.0 mmHg   Bicarbonate 29.9 (H) 20.0 - 24.0 mEq/L   TCO2 31.5 0 - 100 mmol/L   Acid-Base Excess 5.1 (H) 0.0 - 2.0 mmol/L   O2 Saturation 98.9 %   Patient temperature 98.6    Collection site LEFT RADIAL    Drawn by [id number]    Sample type ARTERIAL DRAW    Allens test (pass/fail) PASS PASS  Glucose, capillary  Result Value Ref Range   Glucose-Capillary 352 (H) 65 - 99 mg/dL  Troponin I  Result Value Ref Range   Troponin I <0.03 <0.031 ng/mL  Basic metabolic panel  Result Value Ref Range   Sodium 137 135 - 145 mmol/L   Potassium 3.9 3.5 - 5.1 mmol/L   Chloride 100 (L) 101 - 111 mmol/L   CO2 31 22 - 32 mmol/L   Glucose, Bld 206 (H) 65 - 99 mg/dL   BUN 15 6 - 20 mg/dL   Creatinine, Ser 0.48 0.44 - 1.00 mg/dL   Calcium 8.8 (L) 8.9 - 10.3 mg/dL   GFR calc non Af Amer >60 >60 mL/min   GFR calc Af Amer >60 >60 mL/min   Anion gap 6 5 - 15  Glucose, capillary  Result Value Ref Range   Glucose-Capillary 156 (H) 65 - 99 mg/dL  Glucose, capillary  Result Value Ref Range  Glucose-Capillary 166 (H) 65 - 99 mg/dL  Glucose, capillary  Result Value Ref Range   Glucose-Capillary 298 (H) 65 - 99 mg/dL  Glucose, capillary  Result Value Ref Range   Glucose-Capillary 318 (H) 65 - 99 mg/dL  Glucose, capillary  Result Value Ref Range   Glucose-Capillary 314 (H) 65 - 99 mg/dL   Comment 1 Notify RN    Comment 2 Document in Chart   Glucose, capillary  Result Value Ref Range   Glucose-Capillary 215 (H) 65 - 99 mg/dL   Comment 1 Notify RN   Glucose, capillary  Result Value Ref Range   Glucose-Capillary 341 (H) 65  - 99 mg/dL   Comment 1 Notify RN   Glucose, capillary  Result Value Ref Range   Glucose-Capillary 205 (H) 65 - 99 mg/dL  Glucose, capillary  Result Value Ref Range   Glucose-Capillary 144 (H) 65 - 99 mg/dL  Glucose, capillary  Result Value Ref Range   Glucose-Capillary 165 (H) 65 - 99 mg/dL   Comment 1 Notify RN    Comment 2 Document in Chart   Glucose, capillary  Result Value Ref Range   Glucose-Capillary 323 (H) 65 - 99 mg/dL   Comment 1 Notify RN    Comment 2 Document in Chart   Glucose, capillary  Result Value Ref Range   Glucose-Capillary 203 (H) 65 - 99 mg/dL  Glucose, capillary  Result Value Ref Range   Glucose-Capillary 110 (H) 65 - 99 mg/dL  Glucose, capillary  Result Value Ref Range   Glucose-Capillary 104 (H) 65 - 99 mg/dL   Comment 1 Notify RN    Comment 2 Document in Chart   Glucose, capillary  Result Value Ref Range   Glucose-Capillary 292 (H) 65 - 99 mg/dL   Comment 1 Notify RN    Comment 2 Document in Chart   Glucose, capillary  Result Value Ref Range   Glucose-Capillary 198 (H) 65 - 99 mg/dL  Glucose, capillary  Result Value Ref Range   Glucose-Capillary 107 (H) 65 - 99 mg/dL   Comment 1 Notify RN   Glucose, capillary  Result Value Ref Range   Glucose-Capillary 213 (H) 65 - 99 mg/dL  Glucose, capillary  Result Value Ref Range   Glucose-Capillary 244 (H) 65 - 99 mg/dL  Glucose, capillary  Result Value Ref Range   Glucose-Capillary 172 (H) 65 - 99 mg/dL   Comment 1 Notify RN    Comment 2 Document in Chart   Glucose, capillary  Result Value Ref Range   Glucose-Capillary 118 (H) 65 - 99 mg/dL  Glucose, capillary  Result Value Ref Range   Glucose-Capillary 186 (H) 65 - 99 mg/dL     EKG/XRAY:   Primary read interpreted by Dr. Everlene Farrier at [facility].   ASSESSMENT/PLAN: Judith Boyer has done well since discharge. She currently is on prednisone 20 mg a day. She is due to see Dr. Melvyn Novas  tomorrow. He will decide on the taper dose of prednisone he wants. I started  her on Lantus insulin to start at 10 units and increase as needed to get a morning sugar between 140 and 180. She will check her sugars twice a day and record. Xanax was refilled to take 0.25 twice a day. No other changes were made. [facility] is going to be involved with her care.   Gross sideeffects, risk and benefits, and alternatives of medications d/w patient. Patient is aware that all medications have potential sideeffects and we are unable to predict  every sideeffect or drug-drug interaction that may occur.  Judith Queen MD 09/21/2014 1:23 PM

## 2014-09-21 NOTE — Progress Notes (Deleted)
   Subjective:    Patient ID: Judith Boyer, female    DOB: 11-Jun-1945, 69 y.o.   MRN: [id number]  HPI    Review of Systems     Objective:   Physical Exam        Assessment & Plan:

## 2014-09-21 NOTE — Progress Notes (Signed)
This chart was scribed for Judith Queen, MD by [name], Medical Scribe. This patient was seen in Room 21 and the patient's care was started at 1:23 PM.   Chief Complaint:  Chief Complaint  Patient presents with  . hospital follow up    HPI: Judith Boyer is a 69 y.o. female with a medical history of COPD who reports to [facility] today for a hospital follow up. Pt was admitted to the hospital on 09/07/2014 for shortness of breath and hyperglycemia. Pt reports that the hospital did not change any of her medications. She notes that she was discharged with Prednisone 20 mg, however the specialist advised her to be on a 30 days regiment. She does report that her symptoms have improved. She indicates that she felt a lot stronger after her hospital discharge.  Pt indicates that her blood sugar has been running in the 300's range. She notes that she is trying to control her diet to improve her symptoms. Pt is requesting a prescription for Accu chek test strips. She also reports that 20 tablets a month of xanax are not enough for her, she is requesting a bigger refill.  Pt notes that she still has a nurse come form [facility] once a week, and she did get in touch with [facility].   Pt reports that she has an appointment with Dr. Laurance Flatten tomorrow.    Past Medical History  Diagnosis Date  . COPD (chronic obstructive pulmonary disease)   . Hypertension   . Coronary artery disease   . Shortness of breath   . High cholesterol   . Myocardial infarction 02/2011  . On home oxygen therapy     "2L; 24h/day" (02/08/2014)  . Pneumonia     "couple times" (02/08/2014)  . Type II diabetes mellitus     type 2  . Anemia   . History of blood transfusion 2014    "extremely anemic"   Past Surgical History  Procedure Laterality Date  . Shoulder arthroscopy w/ rotator cuff repair Left   . Left heart catheterization with coronary angiogram N/A 02/09/2011    Procedure: LEFT HEART  CATHETERIZATION WITH CORONARY ANGIOGRAM;  Surgeon: Birdie Riddle, MD;  Location: [facility] CATH [facility];  Service: Cardiovascular;  Laterality: N/A;  . Tonsillectomy    . Abdominal hysterectomy  1970's  . Coronary angioplasty with stent placement  12/2007    "2"  . Cardiac catheterization  02/2011   Social History   Social History  . Marital Status: Widowed    Spouse Name: N/A  . Number of Children: N/A  . Years of Education: N/A   Social History Main Topics  . Smoking status: Former Smoker -- 1.00 packs/day for 50 years    Types: Cigarettes    Quit date: 01/10/2013  . Smokeless tobacco: Never Used  . Alcohol Use: No  . Drug Use: No  . Sexual Activity: No   Other Topics Concern  . None   Social History Narrative   Family History  Problem Relation Age of Onset  . Heart disease Maternal Grandmother   . Brain cancer Brother   . Lung cancer Mother     smoked   Allergies  Allergen Reactions  . Doxycycline Anaphylaxis  . Codeine Other (See Comments)    hyperactivity  . Dulera [Mometasone Furo-Formoterol Fum] Hives and Rash  . Spiriva Handihaler [Tiotropium Bromide Monohydrate] Rash and Other (See Comments)    Rash and Urinary Retention  .  Adhesive [Tape] Rash  . Latex Other (See Comments)    unknown  . Lisinopril Cough   Prior to Admission medications   Medication Sig Start Date End Date Taking? Authorizing Provider  albuterol (PROAIR HFA) 108 (90 BASE) MCG/ACT inhaler Inhale 2 puffs into the lungs every 4 (four) hours as needed for wheezing or shortness of breath. 08/17/14   [name], MD  ALPRAZolam Duanne Moron) 0.25 MG tablet Take 1 tablet (0.25 mg total) by mouth 3 (three) times daily as needed for anxiety. 09/14/14   Verner Mould, MD  bisoprolol (ZEBETA) 5 MG tablet Take 1 tablet (5 mg total) by mouth daily. Patient not taking: Reported on 08/17/2014 06/21/14   Aquilla Hacker, MD  clopidogrel (PLAVIX) 75 MG tablet Take 1 tablet (75 mg total) by mouth every morning.  06/28/13   [name], MD  Fluticasone-Salmeterol (ADVAIR) 500-50 MCG/DOSE AEPB Inhale 1 puff into the lungs 2 (two) times daily. Patient not taking: Reported on 09/17/2014 01/14/14   [name], MD  guaiFENesin (MUCINEX) 600 MG 12 hr tablet Take 600 mg by mouth daily. Take every day per patient    Historical Provider, MD  ipratropium-albuterol (DUONEB) 0.5-2.5 (3) MG/3ML SOLN Take 3 mLs by nebulization every 4 (four) hours. 09/14/14   Verner Mould, MD  Iron TABS Take 1 tablet by mouth daily.    Historical Provider, MD  metFORMIN (GLUCOPHAGE) 500 MG tablet Take 2 tablets (1,000 mg total) by mouth 2 (two) times daily with a meal. 09/14/14   Verner Mould, MD  montelukast (SINGULAIR) 10 MG tablet take 1 tablet by mouth once daily Patient taking differently: take 1 tablet by mouth once every evening 07/09/14   [name], MD  nitroGLYCERIN (NITROSTAT) 0.4 MG SL tablet Place 1 tablet (0.4 mg total) under the tongue every 5 (five) minutes x 3 doses as needed for chest pain. 02/07/12   Dixie Dials, MD  pravastatin (PRAVACHOL) 80 MG tablet Take 1 tablet (80 mg total) by mouth every evening. 06/28/13   [name], MD  predniSONE (DELTASONE) 20 MG tablet Take 1 tablet (20 mg total) by mouth daily with breakfast. 09/14/14   Verner Mould, MD  roflumilast (DALIRESP) 500 MCG TABS tablet Take 1 tablet (500 mcg total) by mouth daily. Patient not taking: Reported on 09/17/2014 09/14/14   Verner Mould, MD  valsartan (DIOVAN) 80 MG tablet Take 1 tablet (80 mg total) by mouth daily. Patient taking differently: Take 80 mg by mouth every evening.  02/22/14   Dorian Heckle English, PA     ROS: The patient has shortness of breath denies. All other systems have been reviewed and were otherwise negative with the exception of those mentioned in the HPI and as above.    PHYSICAL EXAM: Filed Vitals:   09/21/14 1320  BP: 145/65  Pulse: 127  Temp: 98.2 F (36.8 C)  Resp: 20    There is no weight on file to calculate BMI.   General: Alert, no acute distress HEENT:  Normocephalic, atraumatic, oropharynx patent. Eye: [name] [facility] Cardiovascular:  Regular Regular tachycardia.  No pedal edema.  Respiratory: In no distress, but very diminish breath sounds in both bases with air exchange. No wheezes, rales, or rhonchi.  No cyanosis, no use of accessory musculature Abdominal: No organomegaly, abdomen is soft and non-tender, positive bowel sounds.  No masses. Musculoskeletal: Gait intact. No edema in lower extremities, tenderness Skin: No rashes. Neurologic: Facial musculature symmetric. Psychiatric:  Patient acts appropriately throughout our interaction. Lymphatic: No cervical or submandibular lymphadenopathy Genitourinary/Anorectal: No acute findings  SPO2 at 1:33 pm is 98%.   LABS: Results for orders placed or performed during the hospital encounter of 09/07/14  MRSA PCR Screening  Result Value Ref Range   MRSA by PCR NEGATIVE NEGATIVE  Basic metabolic panel  Result Value Ref Range   Sodium 137 135 - 145 mmol/L   Potassium 4.3 3.5 - 5.1 mmol/L   Chloride 97 (L) 101 - 111 mmol/L   CO2 30 22 - 32 mmol/L   Glucose, Bld 346 (H) 65 - 99 mg/dL   BUN 13 6 - 20 mg/dL   Creatinine, Ser 0.51 0.44 - 1.00 mg/dL   Calcium 9.4 8.9 - 10.3 mg/dL   GFR calc non Af Amer >60 >60 mL/min   GFR calc Af Amer >60 >60 mL/min   Anion gap 10 5 - 15  CBC  Result Value Ref Range   WBC 8.4 4.0 - 10.5 K/uL   RBC 4.04 3.87 - 5.11 MIL/uL   Hemoglobin 12.0 12.0 - 15.0 g/dL   HCT 37.0 36.0 - 46.0 %   MCV 91.6 78.0 - 100.0 fL   MCH 29.7 26.0 - 34.0 pg   MCHC 32.4 30.0 - 36.0 g/dL   RDW 12.5 11.5 - 15.5 %   Platelets 223 150 - 400 K/uL  Glucose, capillary  Result Value Ref Range   Glucose-Capillary 344 (H) 65 - 99 mg/dL  Glucose, capillary  Result Value Ref Range   Glucose-Capillary 128 (H) 65 - 99 mg/dL  Glucose, capillary  Result Value Ref Range   Glucose-Capillary 123  (H) 65 - 99 mg/dL   Comment 1 Document in Chart   Glucose, capillary  Result Value Ref Range   Glucose-Capillary 218 (H) 65 - 99 mg/dL   Comment 1 Document in Chart   Blood gas, arterial  Result Value Ref Range   O2 Content 2.0 L/min   Delivery systems NASAL CANNULA    pH, Arterial 7.389 7.350 - 7.450   pCO2 arterial 50.6 (H) 35.0 - 45.0 mmHg   pO2, Arterial 115 (H) 80.0 - 100.0 mmHg   Bicarbonate 29.9 (H) 20.0 - 24.0 mEq/L   TCO2 31.5 0 - 100 mmol/L   Acid-Base Excess 5.1 (H) 0.0 - 2.0 mmol/L   O2 Saturation 98.9 %   Patient temperature 98.6    Collection site LEFT RADIAL    Drawn by [id number]    Sample type ARTERIAL DRAW    Allens test (pass/fail) PASS PASS  Glucose, capillary  Result Value Ref Range   Glucose-Capillary 352 (H) 65 - 99 mg/dL  Troponin I  Result Value Ref Range   Troponin I <0.03 <0.031 ng/mL  Basic metabolic panel  Result Value Ref Range   Sodium 137 135 - 145 mmol/L   Potassium 3.9 3.5 - 5.1 mmol/L   Chloride 100 (L) 101 - 111 mmol/L   CO2 31 22 - 32 mmol/L   Glucose, Bld 206 (H) 65 - 99 mg/dL   BUN 15 6 - 20 mg/dL   Creatinine, Ser 0.48 0.44 - 1.00 mg/dL   Calcium 8.8 (L) 8.9 - 10.3 mg/dL   GFR calc non Af Amer >60 >60 mL/min   GFR calc Af Amer >60 >60 mL/min   Anion gap 6 5 - 15  Glucose, capillary  Result Value Ref Range   Glucose-Capillary 156 (H) 65 - 99 mg/dL  Glucose, capillary  Result Value Ref Range  Glucose-Capillary 166 (H) 65 - 99 mg/dL  Glucose, capillary  Result Value Ref Range   Glucose-Capillary 298 (H) 65 - 99 mg/dL  Glucose, capillary  Result Value Ref Range   Glucose-Capillary 318 (H) 65 - 99 mg/dL  Glucose, capillary  Result Value Ref Range   Glucose-Capillary 314 (H) 65 - 99 mg/dL   Comment 1 Notify RN    Comment 2 Document in Chart   Glucose, capillary  Result Value Ref Range   Glucose-Capillary 215 (H) 65 - 99 mg/dL   Comment 1 Notify RN   Glucose, capillary  Result Value Ref Range   Glucose-Capillary 341 (H) 65  - 99 mg/dL   Comment 1 Notify RN   Glucose, capillary  Result Value Ref Range   Glucose-Capillary 205 (H) 65 - 99 mg/dL  Glucose, capillary  Result Value Ref Range   Glucose-Capillary 144 (H) 65 - 99 mg/dL  Glucose, capillary  Result Value Ref Range   Glucose-Capillary 165 (H) 65 - 99 mg/dL   Comment 1 Notify RN    Comment 2 Document in Chart   Glucose, capillary  Result Value Ref Range   Glucose-Capillary 323 (H) 65 - 99 mg/dL   Comment 1 Notify RN    Comment 2 Document in Chart   Glucose, capillary  Result Value Ref Range   Glucose-Capillary 203 (H) 65 - 99 mg/dL  Glucose, capillary  Result Value Ref Range   Glucose-Capillary 110 (H) 65 - 99 mg/dL  Glucose, capillary  Result Value Ref Range   Glucose-Capillary 104 (H) 65 - 99 mg/dL   Comment 1 Notify RN    Comment 2 Document in Chart   Glucose, capillary  Result Value Ref Range   Glucose-Capillary 292 (H) 65 - 99 mg/dL   Comment 1 Notify RN    Comment 2 Document in Chart   Glucose, capillary  Result Value Ref Range   Glucose-Capillary 198 (H) 65 - 99 mg/dL  Glucose, capillary  Result Value Ref Range   Glucose-Capillary 107 (H) 65 - 99 mg/dL   Comment 1 Notify RN   Glucose, capillary  Result Value Ref Range   Glucose-Capillary 213 (H) 65 - 99 mg/dL  Glucose, capillary  Result Value Ref Range   Glucose-Capillary 244 (H) 65 - 99 mg/dL  Glucose, capillary  Result Value Ref Range   Glucose-Capillary 172 (H) 65 - 99 mg/dL   Comment 1 Notify RN    Comment 2 Document in Chart   Glucose, capillary  Result Value Ref Range   Glucose-Capillary 118 (H) 65 - 99 mg/dL  Glucose, capillary  Result Value Ref Range   Glucose-Capillary 186 (H) 65 - 99 mg/dL     EKG/XRAY:   Primary read interpreted by Dr. Everlene Farrier at [facility].   ASSESSMENT/PLAN: Meredith Mody has done well since discharge. She currently is on prednisone 20 mg a day. She is due to see Dr. Melvyn Novas  tomorrow. He will decide on the taper dose of prednisone he wants. I started  her on Lantus insulin to start at 10 units and increase as needed to get a morning sugar between 140 and 180. She will check her sugars twice a day and record. Xanax was refilled to take 0.25 twice a day. No other changes were made. [facility] is going to be involved with her care.   Gross sideeffects, risk and benefits, and alternatives of medications d/w patient. Patient is aware that all medications have potential sideeffects and we are unable to predict  every sideeffect or drug-drug interaction that may occur.  Judith Queen MD 09/21/2014 2:22 PM

## 2014-09-22 ENCOUNTER — Encounter: Payer: Self-pay | Admitting: Internal Medicine

## 2014-09-22 ENCOUNTER — Ambulatory Visit (INDEPENDENT_AMBULATORY_CARE_PROVIDER_SITE_OTHER): Payer: Medicare Other | Admitting: Internal Medicine

## 2014-09-22 VITALS — BP 122/60 | HR 110 | Ht 61.0 in | Wt 89.6 lb

## 2014-09-22 DIAGNOSIS — J449 Chronic obstructive pulmonary disease, unspecified: Secondary | ICD-10-CM

## 2014-09-22 DIAGNOSIS — J9612 Chronic respiratory failure with hypercapnia: Secondary | ICD-10-CM | POA: Diagnosis not present

## 2014-09-22 MED ORDER — PREDNISONE 10 MG PO TABS: ORAL_TABLET | ORAL | Status: DC

## 2014-09-22 NOTE — Patient Instructions (Addendum)
Plan A = Automatic symbicort 160 Take 2 puffs first thing in am and then another 2 puffs about 12 hours later.  (or Advair)  Duoneb four times daily   Plan B = Back up = Only use your albuterol (proair) as a rescue medication to be used if you can't catch your breath by resting or doing a relaxed purse lip breathing pattern.  - The less you use it, the better it will work when you need it. - Ok to use up to every 4 hours if you must but call for immediate appointment if use goes up over your usual need - Don't leave home without it !!  (think of it like your spare tire for your car)   Plan C = backs up plan B = Only use your albuterol nebulizer up to every 4 hours if proair doesn't work  Prednisone Ceiling is 20 mg daily , but floor = 10 mg daily   Please schedule a follow up office visit in 4 weeks, sooner if needed  Late add : readdress EOL issues

## 2014-09-22 NOTE — Progress Notes (Signed)
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 On max dose advair and already took neb and combivent on day of ov and "no better" with nasal congestion as well. rec Stop advair Start dulera 200 Take 2 puffs first thing in am and then another 2 puffs about 12 hours later.  Continue duoneb 4 x daily and the [place] is only if needed The key is to stop smoking completely before smoking completely stops you!   01/27/2013 f/u ov/Judith Boyer re: copd GOLD IV/ noct 02 dep  Chief Complaint  Patient presents with  . Follow-up    Pt reports breathing is about the same- has good days and bad days.  overall better on advair 500/50 vs dulera so switched back  rec Plan A = Automatic Tudorza one twice daily immediately after advair  Ok to use advair 500 / 50 one twice daily  Plan B = Back up = Only use your albuterol (proair)  Plan C = backs up plan B = Only use your albuterol nebulizer up to every 4 hours if proair doesn't work Prednisone 10 mg take  4 each am x 2 days,   2 each am x 2 days,  1 each am x 2 days and stop  02/10/2013 f/u ov/Judith Boyer re: GOLD IV COPD/ noct 02 dep Chief Complaint  Patient presents with  . Follow-up    Pt c/o increased SOB for the past 2 wks. She states using albuterol inhaler and neb 3-4 times per day. Sometimes wakes up at night and has to use rescue medications.   Can still taste cigs in her sputum. Not  using mucinex dm "you told me to stop it"/ otherwise appears adherent Not able to afford tudorza Doe x > room to room slow walking  rec Stop tudorza and cozar Start spiriva one capsule each am> could not urinate so stopped Start diovan (valsartan) 80 mg daily in place of cozar For cough congestion use mucinex up to 1200 mg every 12 hours and as much of the flutter valve as you can    Date of Admission: 02/20/2013 Date of Discharge: 02/26/13   Primary Care Provider: Jenny Reichmann, MD  Consultants: GI, Palliative, Pulmonology  Indication for Hospitalization: Shortness of breath  Discharge Diagnoses/Problem List:  Patient Active Problem List    Diagnosis  Date Noted   .  Palliative care encounter  02/25/2013   .  Weakness generalized  02/25/2013   .  COPD exacerbation  02/20/2013   .  CAP (community acquired pneumonia)  02/20/2013   .  Anemia  02/20/2013      03/10/2013 f/u ov/Judith Boyer re: copd/ chronic resp failure/ 02 at hs 2lpm humidified  Chief Complaint  Patient presents with  . Follow-up    Pt states recovering from PNA. She c/o still feeling a little SOB since d/c'ed from  hospital on 02/26/13. She is using rescue inhaler 4-5 times per day and albuterol neb approx twice daily.   Advair 500 bid Ipatropium tid  Alb saba also  rec Continue advair for now Duoneb should be four times daily Only use your albuterol ( proair) as rescue   05/26/2013 f/u ov/Judith Boyer re: CODP GOLD III plus noct 02  Chief Complaint  Patient presents with  . Follow-up    Breathing is overall doing well. She states that she is using proair at least 2 x per day, and has not used albuterol neb recently.  uses proair first thing in am because advair and duoneb too slow to work but overall satisfied at her usual baseline doe rec F/u prn  Date of birth: Nov 27, 1947Age: 69 y.o.Gender: female Date of Admission: 8/2/2016Date of Discharge: 09/14/14 Admitting Physician: Zenia Resides, MD  Primary Care Provider: Jenny Reichmann, MD Consultants: CCM, palliative  Indication for Hospitalization: shortness of breath  Discharge Diagnoses/Problem List:  Patient Active Problem List   Diagnosis Date Noted  . Acute on chronic respiratory failure with hypoxia   . Palliative care by specialist   . Dysphagia   . Dyspnea 08/13/2014  . Tachycardia   . Respiratory acidosis   . Coronary artery disease involving native coronary artery of native heart without angina pectoris   . Type 2 diabetes mellitus without complication   . Anxiety   . COPD with acute exacerbation 06/18/2014  . B12 deficiency 02/10/2014  . Protein-calorie malnutrition, severe 02/09/2014  . Acute respiratory distress 02/08/2014  . Vitamin B 12 deficiency 06/28/2013  . Palliative care encounter 02/25/2013  . Weakness generalized 02/25/2013  . COPD exacerbation 02/20/2013  . CAP (community acquired pneumonia) 02/20/2013  . Anemia 02/20/2013  . Nocturnal hypoxemia 12/30/2012  . Chest pain at rest 02/09/2011  . CAD in native artery 02/09/2011  . COPD GOLD III 02/06/2011  . Smoker 02/06/2011  . Hypertension 02/06/2011  . Diabetes mellitus type 2, controlled, without complications 24/10/7351     Disposition: home  Discharge Condition: stable  Discharge Exam:  General: elderly lady lying in bed eating breakfast in NAD Cardiovascular: RRR, no murmurs appreciated Respiratory: speaking in full sentences, no wheezing, wearing 2L [place]  Abdomen: soft, non-tender, non-distended, +BS Neuro: A&Ox3, no focal deficits Psych: appropriate mood and affect   Brief Hospital Course:  Patient presented with SOB. She has a PMH of COPD. She was started on duonebs scheduled q4, albuterol neb q2 PRN, levoquin, supplemental O2 (home dose of 2L [place]) and prednisone for COPD exacerbation. Patient's breathing did not improve with these  treatments, and she was transferred to stepdown unit for potential BiPAP. CCM was consulted due to concern for potential intubation. Patient's breathing actually improved without BiPAP.   Patient also experienced L shoulder pain and difficulty swallowing. Given accompanying SOB, troponin and EKG were obtained to r/u cardiac event. Both were unremarkable. Barium swallow performed for dysphagia showed no abnormalities.   Patient still did not improve, so pulmonology was consulted. They recommended an updated COPD medication regimen, which patient will be discharged on.   Given the advanced stage of her disease, palliative care was consulted. Patient was not receptive to palliative's ideas, and would not discuss code status with her family. Palliative suggested repeatedly the use of low-dose morphine for dyspnea symptomatic relief, but the patient was very opposed to this idea.   As patient's breathing was stable and back to her baseline, she was discharged with plans for close pulmonology  and PCP follow-up.  Issues for Follow Up:  1. Given need for slow prednisone taper, patient will need changes in diabetes control medication. She was on 500 mg metformin before admission. She was discharged with an increased dose of 1 g metformin BID. Could consider adding one dose glipizide in AM if not adequately controlled on metformin alone.  2. Patient will need extended prednisone taper. Suggest four week taper with pulmonology follow-up. Potentially needs chronic steroids.  3. For COPD, pt was discharged on the following medications: duonebs at least 4 times a day, advair, prednisone 60 mg (to be tapered), daliresp, singulair, and albuterol inhaler. 4. If patient's dysphagia continues, need outpatient GI appointment for repeat imaging.  5. Xanax increased to TID. Significantly improves patient's breathing status.  6. Patient was started on low-dose zoloft, but did not feel it was effective, and does not  want to try another anti-depressant despite symptoms.  7. Consider low-dose morphine for symptomatic relief dyspnea. Patient was not receptive of this idea while inpatient, but may consider broaching the topic again outpatient if dyspnea worsens.  8. Palliative care tried to discuss code status with patient repeatedly. Given patient's poor prognosis, recommend discussing code status with patient again, especially if family is present.        09/22/2014 extended post hosp f/u / transition of care f/u  ov/Judith Boyer re: copd gold III with freq exac / 02 2lpm 24/7 and pred down 20mg  since d/c  Chief Complaint  Patient presents with  . Follow-up    Pt states being discharged from hospital 1 week ago. States that breathing is better since being in hospital. Pt states increased SOB with exertion. Pt denies wheezing, cough, chest congestion or pain   had been on max doses of advair with flares/ typically start with bad cough. Baseline doe = MMRC 3 even on 02   No obvious patterns in day to day or daytime variabilty or assoc cough or  cp or chest tightness, subjective wheeze overt sinus or hb symptoms. No unusual exp hx or h/o childhood pna/ asthma or knowledge of premature birth.  Sleeping ok without nocturnal  or early am exacerbation  of respiratory  c/o's or need for noct saba. Also denies any obvious fluctuation of symptoms with weather or environmental changes or other aggravating or alleviating factors except as outlined above   Current Medications, Allergies, Complete Past Medical History, Past Surgical History, Family History, and Social History were reviewed in [employer] record.  ROS  The following are not active complaints unless bolded sore throat, dysphagia, dental problems, itching, sneezing,  nasal congestion or excess/ purulent secretions, ear ache,   fever, chills, sweats, unintended wt loss, pleuritic or exertional cp, hemoptysis,  orthopnea pnd or leg swelling,  presyncope, palpitations, heartburn, abdominal pain, anorexia, nausea, vomiting, diarrhea  or change in bowel or urinary habits, change in stools or urine, dysuria,hematuria,  rash, arthralgias, visual complaints, headache, numbness weakness or ataxia or problems with walking or coordination,  change in mood/affect or memory.                    Objective:   Physical Exam  amb wf nad   01/13/2013        106  > 01/27/2013   105 > 02/10/2013 105 > 03/10/13 107 > 05/26/2013 104 > 09/22/2014 90  Wt Readings from Last 3 Encounters:  12/30/12 108 lb 6.4 oz (49.17 kg)  11/13/12 109 lb (49.442 kg)  10/18/12  111 lb (50.349 kg)      HEENT mild turbinate edema.  Oropharynx no thrush or excess pnd or cobblestoning.  No JVD or cervical adenopathy. Mild accessory muscle hypertrophy. Trachea midline, nl thryroid. Chest was hyperinflated by percussion with diminished breath sounds and moderate increased exp time without wheeze. Hoover sign positive at mid inspiration. Regular rate and rhythm without murmur gallop or rub or increase P2 or edema.  Abd: no hsm, nl excursion. Ext warm without cyanosis or clubbing.          I personally reviewed images and agree with radiology impression as follows:  CXR:   09/06/14 Hyper inflated lungs with apical scarring. No acute findings. Assessment & Plan:

## 2014-09-23 ENCOUNTER — Other Ambulatory Visit: Payer: Self-pay

## 2014-09-23 NOTE — Patient Outreach (Signed)
This RNCM was successful in making telephonic contact with patient for community care coordination assessment. Patient provided name, date of birth and address to satisfy HIPPA identifiers. Patient stated she attended the appointment, medication changes made.   COPD Patient has appointment with pulmonologist yesterday. Patient states her night did not go good because of the change in her medication.  MEDICATION CHANGES Stop tudor and cozaar Start Spiriva one capsule each am. Start Diovan (valsartan)PLAN 80 mg daily in place of cozaar For cough congestion use mucinex up to 1200 mg every 12 hours and as much of the flutter valve as needed.  Plan: Home visit on Tuesday, September 28, 2014

## 2014-09-24 ENCOUNTER — Encounter: Payer: Self-pay | Admitting: Internal Medicine

## 2014-09-24 DIAGNOSIS — E119 Type 2 diabetes mellitus without complications: Secondary | ICD-10-CM | POA: Diagnosis not present

## 2014-09-24 DIAGNOSIS — Z9981 Dependence on supplemental oxygen: Secondary | ICD-10-CM | POA: Diagnosis not present

## 2014-09-24 DIAGNOSIS — I10 Essential (primary) hypertension: Secondary | ICD-10-CM | POA: Diagnosis not present

## 2014-09-24 DIAGNOSIS — E539 Vitamin B deficiency, unspecified: Secondary | ICD-10-CM | POA: Diagnosis not present

## 2014-09-24 DIAGNOSIS — E785 Hyperlipidemia, unspecified: Secondary | ICD-10-CM | POA: Diagnosis not present

## 2014-09-24 DIAGNOSIS — Z7952 Long term (current) use of systemic steroids: Secondary | ICD-10-CM | POA: Diagnosis not present

## 2014-09-24 DIAGNOSIS — I2510 Atherosclerotic heart disease of native coronary artery without angina pectoris: Secondary | ICD-10-CM | POA: Diagnosis not present

## 2014-09-24 DIAGNOSIS — J9612 Chronic respiratory failure with hypercapnia: Secondary | ICD-10-CM | POA: Insufficient documentation

## 2014-09-24 DIAGNOSIS — Z87891 Personal history of nicotine dependence: Secondary | ICD-10-CM | POA: Diagnosis not present

## 2014-09-24 DIAGNOSIS — F419 Anxiety disorder, unspecified: Secondary | ICD-10-CM | POA: Diagnosis not present

## 2014-09-24 DIAGNOSIS — J441 Chronic obstructive pulmonary disease with (acute) exacerbation: Secondary | ICD-10-CM | POA: Diagnosis not present

## 2014-09-24 DIAGNOSIS — I252 Old myocardial infarction: Secondary | ICD-10-CM | POA: Diagnosis not present

## 2014-09-24 MED ORDER — BUDESONIDE-FORMOTEROL FUMARATE 160-4.5 MCG/ACT IN AERO: INHALATION_SPRAY | RESPIRATORY_TRACT | Status: DC

## 2014-09-24 NOTE — Assessment & Plan Note (Signed)
HC03 31 on 09/09/14 > see ABG 09/08/14 with pC02 =51  In this setting target sats should be in low 90s/no higher

## 2014-09-24 NOTE — Assessment & Plan Note (Addendum)
-   quit smoking 01/2013  - hfa 90% p coaching 12/30/2012  - 12/30/2012  Walked RA x 3 laps @ 185 ft each stopped due to legs shaky, sats 94% - 01/13/2013  Walked RA x 3 laps @ 185 ft each stopped due to  End of study, sats 93%  - added tudorza 01/27/13 and d/c duoneb/ atrovent> changed to spiriva 02/10/2013 due to insurance> could not urinate so stopped it - Spirometry 02/10/2013   FEV1  0.65 (33%) ratio 45   Obviously very difficult to control copd with multiple admits in pt reluctant to try any changes to a regimen that clearly isn't working despite the fact she says she quit smoking.  DDX of  difficult airways management all start with A and  include Adherence, Ace Inhibitors, Acid Reflux, Active Sinus Disease, Alpha 1 Antitripsin deficiency, Anxiety masquerading as Airways dz,  ABPA,  allergy(esp in young), Aspiration (esp in elderly), Adverse effects of meds,  Active smokers, A bunch of PE's (a small clot burden can't cause this syndrome unless there is already severe underlying pulm or vascular dz with poor reserve) plus two Bs  = Bronchiectasis and Beta blocker use..and one C= CHF  Adherence is always the initial "prime suspect" and is a multilayered concern that requires a "trust but verify" approach in every patient - starting with knowing how to use medications, especially inhalers, correctly, keeping up with refills and understanding the fundamental difference between maintenance and prns vs those medications only taken for a very short course and then stopped and not refilled.  - 09/22/2014 hfa 75% effective at baseline and near 90% with coaching > rec rechallenge with symbicort 160 2bid  ?  Adverse effects of advair  > needs trial off and if fails consider   ? Allergies > doubt but ok to continue singuair and for now will hold pred at 10 mg until we regroup   ? Aspiration/Acid reflux > see esophagram 09/09/14 neg but limited study so low threshold to add gerd rx esp if coughing which risks  secondary coughing   I had an extended discussion with the patient reviewing all relevant studies completed to date and  Lasting  25  minutes of a 40 minute visit    Though somewhat paradoxic, when the lung fails to clear C02 properly and pC02 rises the lung then becomes a more efficient scavenger of C02 allowing lower work of breathing and  better C02 clearance albeit at a higher serum pC02 level - this is why pts can look a lot better than their ABG's would suggest and why it's so difficult to prognosticate endstage dz.  It's also why I strongly rec DNI status (ventilating pts down to a nl pC02 adversely affects this compensatory mechanism)  - pt declined NCB but also bipap on last admit as well as pallliative measures but need to revisit this issue   Each maintenance medication was reviewed in detail including most importantly the difference between maintenance and prns and under what circumstances the prns are to be triggered using an action plan format that is not reflected in the computer generated alphabetically organized AVS.    Please see instructions for details which were reviewed in writing and the patient given a copy highlighting the part that I personally wrote and discussed at today's ov.

## 2014-09-28 ENCOUNTER — Telehealth: Payer: Self-pay | Admitting: Emergency Medicine

## 2014-09-28 ENCOUNTER — Other Ambulatory Visit: Payer: Self-pay | Admitting: *Deleted

## 2014-09-28 ENCOUNTER — Other Ambulatory Visit: Payer: Self-pay

## 2014-09-28 ENCOUNTER — Other Ambulatory Visit: Payer: Self-pay | Admitting: Emergency Medicine

## 2014-09-28 DIAGNOSIS — J441 Chronic obstructive pulmonary disease with (acute) exacerbation: Secondary | ICD-10-CM

## 2014-09-28 MED ORDER — INSULIN NPH (HUMAN) (ISOPHANE) 100 UNIT/ML ~~LOC~~ SUSP
10.0000 [IU] | Freq: Every morning | SUBCUTANEOUS | Status: DC
Start: 2014-09-28 — End: 2014-09-30

## 2014-09-28 NOTE — Telephone Encounter (Signed)
Patient cannot afford her Lantus insulin. We'll change her to NPH insulin to take 10 units in the morning. [facility] and will help with gradually increasing the dosage to achieve blood sugars between 120 and 200. She is having difficulty with her breathing. This occurred after her prednisone began to be tapered. I advised the nurse to contact Dr. Gustavus Bryant office for instructions regarding her pulmonary medications. I also told them I would be happy to be involved with palliative care.

## 2014-09-29 ENCOUNTER — Other Ambulatory Visit: Payer: Self-pay | Admitting: Pharmacist

## 2014-09-29 ENCOUNTER — Telehealth: Payer: Self-pay

## 2014-09-29 NOTE — Patient Outreach (Signed)
Received a call from Care Management Assistant Damita Rhodie that patient had called and was unable to afford her Lantus insulin. Per EPIC note from Dr. Everlene Farrier on 09/28/14, for this reason he changed her from Lantus to NPH insulin to take 10 units in the morning. Called Ms. Moorhead to verify that this prescription had been received.  Spoke with the [employer] who states that the prescription was called in for Novolin N, but the patient's insurance only covers Humulin N and that they had faxed the MD to verify that the change was Okay. Verified with Pharmacist that the dose of 10 units each morning had been called in for this NPH prescription. Note that NPH insulin is available from [facility] over the counter for $25/vial.  Will now call patient to determine if $25/vial is affordable for the patient.  [name], PharmD Clinical Pharmacist [place] [facility] [phone]

## 2014-09-29 NOTE — Patient Outreach (Addendum)
Judith Boyer is a 69 y.o. female referred to pharmacy for medication assistance. Per phone call from Care Management Assistant Damita Rhodie, patient had called and was unable to afford her Lantus insulin. Per EPIC note from Dr. Everlene Farrier on 09/28/14, for this reason he changed her from Lantus to NPH insulin to take 10 units in the morning. Called [name] to verify that this prescription had been received.  Called and spoke with [name] and let her know that I spoke with the [facility] Pharmacist who states that the prescription was called in for Novolin N, but the patient's insurance only covers Humulin N and that they had faxed the MD to verify that the change was Okay. Note that NPH insulin is available over the counter and available from [facility] over the counter for $25/vial.  [name] states that she is able to afford the $25/vial from [facility]. Advised patient to call [facility] and have the prescription transferred to them for this cost savings. Let patient know that even if it is not this price through insurance, to ask the pharmacy to bill without insurance to get this price.   Discussed NPH insulin. Reviewed with patient signs of hypoglycemia and how to treat lows with quick acting source of sugar, monitoring and then having protein-carbohydrate meal or snack once blood sugar back to normal. Reviewed examples of quick-acting sources of sugar and advised patient to purchase glucose tablets while at the store to have on hand at all times. Patient reports that she tests her blood sugars throughout the day and that they have often been getting up into the 400s throughout the day. Patient took notes during our discussion and demonstrated understanding through teach back method.   Patient reports that she has not yet received teaching on insulin administration. Has previous experience with having given these injections to her husband, but reports that this was about 20  years ago. Remind patient that she will need insulin syringes for drawing up the insulin. Note that patient has a follow up appointment with Nurse Care Manager [name] tomorrow morning. Reports that her son will be picking up her insulin for her from the pharmacy, so she is not sure that she will have it by this appointment. However, states that she will ask [name] to go over the insulin administration with her tomorrow.  Provided patient with my contact information.   PLAN:  1) Patient to have transferred and pick up her NPH insulin and syringes from [facility]. Patient to also pick up glucose tablets and keep on hand.  2) Will follow up with patient by phone on Friday, 10/01/14 at 8:30 AM to see what further questions she has and, if needed, will meet with her at her home on Friday for additional counseling on administration.   [name], PharmD Clinical Pharmacist [place] [facility] [phone]

## 2014-09-29 NOTE — Patient Outreach (Addendum)
[place] [facility]) Care Management  September 28, 2014  ROZELL KETTLEWELL 11/17/1945 [id number]   This RNCM met with patient in her home for this initial home visit. Patient identified herself by giving date of birth and contents of our previous conversation. Patient appeared anxious, easily short of breath, as she described her health has deteriorated with the decrease in the Prednisone dose from 20 mg per  Day to 10 mg per day and change from Advair to Symbicort.   Patient stated her pulmonologist gave her the option of going back to the previous regimen.  patient states her glucose levels stay in the 300's to 400's on the 20 mg dose of Prednisone.  Patient states she was not able to afford the Lantus ordered by her primary care physician. Call made to Dr. Everlene Farrier to advise him of patient's issue with Lantus, Dr. Everlene Farrier called in order for NPH which is a lower cost option. Dr. Everlene Farrier also agreed to a [place] in the community as patient and doctor both stated patient is End Stage COPD.    Call made to [name] [place] Intern to advise her of patient's financial situation, and to explore options to assist patient with medication purchases as patient describe her current pharmacy prescription status as being in the DONUT HOLE.  Request for low income subsidy assistance made with [facility] CM Assistant to assist with medication and COPD medications, spoke with [facility] in [place] Rhodie's absence. This RNCM and patient agreed on home visit on Thursday, August  25.  Patient requested visit so this RNCM can assist her in becoming more comfortable with giving herself injections and drawing the insulin in the syringes.

## 2014-09-29 NOTE — Telephone Encounter (Signed)
Pt states the pharmacy was going to get in touch with Dr Everlene Farrier to let him know the Novolin he prescribed her insurance won't cover it and needed to have something else Please call [place]

## 2014-09-30 ENCOUNTER — Other Ambulatory Visit: Payer: Self-pay

## 2014-09-30 DIAGNOSIS — J441 Chronic obstructive pulmonary disease with (acute) exacerbation: Secondary | ICD-10-CM

## 2014-09-30 NOTE — Telephone Encounter (Signed)
Dr Everlene Farrier, the pharm sent a fax stating that Humulin is covered instead of novolin. I have pended it with the same sig and quantity you ordered. Do you want to OK change? If so, I can call pt back and let her know what you Rxd and see if she still wants to talk with you.

## 2014-09-30 NOTE — Telephone Encounter (Signed)
I called and spoke with patient as I asked her to have either the nurse or the pharmacist call me tomorrow. We will start with 4 units once a day and gradually increased. As tolerated. Suggested starting dose is 0.1-0.2 mg/kg once or twice daily

## 2014-09-30 NOTE — Telephone Encounter (Signed)
Please advise 

## 2014-09-30 NOTE — Patient Outreach (Signed)
[place] [facility]) Care Management  09/30/2014  Judith Boyer 1946/01/27 [id number]  This RNCM received call from [name] [place] Intern.  [name] updated this RNCM on status of patient's insulin.   Call made to Dr. Perfecto Kingdom office, spoke to [facility] who stated she would be giving patient a call to answer her questions.

## 2014-09-30 NOTE — Patient Outreach (Signed)
Request from [name], RN to assign Pharmacy, [name], PharmD assigned.  [place] Assistant Phone: [phone]

## 2014-09-30 NOTE — Patient Outreach (Addendum)
[name]) Care Management  09/30/2014  [name] 1945-10-18 [id number]   This RNCM made call to patient, as planned, to follow up with her purchasing insulin and to offer assistance during home visit today.Patient identified herself using HIPPA identifiers of date of birth and address.  Patient advised this RNCM she has not purchased the insulin, was waiting on her son to get paid this afternoon so he can purchase the insulin for her  Call received from [name], [name], who updated this RNCM about insulin as indicated in her note. [name] also advised that [name], [facility] would be following up with patient on tomorrow to educate her on the insulin and drawing up on the insulin.  Plan: Telephone contact with patient on Monday, August 29 to schedule home visit.

## 2014-09-30 NOTE — Patient Outreach (Signed)
Request from [place] to assign Pharmacy,  [name], PharmD assigned.  Thank you  [place] [facility]  [place] Assistant Phone: [phone] Fax: [phone]

## 2014-09-30 NOTE — Telephone Encounter (Signed)
Patient requesting to speak with directly with Dr Everlene Farrier. Per patient it is regarding her insulin being changed. She is requesting Dr Everlene Farrier to call her at [phone]

## 2014-10-01 ENCOUNTER — Telehealth: Payer: Self-pay | Admitting: Emergency Medicine

## 2014-10-01 ENCOUNTER — Other Ambulatory Visit: Payer: Self-pay | Admitting: Pharmacist

## 2014-10-01 DIAGNOSIS — I10 Essential (primary) hypertension: Secondary | ICD-10-CM | POA: Diagnosis not present

## 2014-10-01 DIAGNOSIS — J441 Chronic obstructive pulmonary disease with (acute) exacerbation: Secondary | ICD-10-CM | POA: Diagnosis not present

## 2014-10-01 DIAGNOSIS — I2510 Atherosclerotic heart disease of native coronary artery without angina pectoris: Secondary | ICD-10-CM | POA: Diagnosis not present

## 2014-10-01 DIAGNOSIS — E119 Type 2 diabetes mellitus without complications: Secondary | ICD-10-CM | POA: Diagnosis not present

## 2014-10-01 DIAGNOSIS — Z9981 Dependence on supplemental oxygen: Secondary | ICD-10-CM | POA: Diagnosis not present

## 2014-10-01 DIAGNOSIS — F419 Anxiety disorder, unspecified: Secondary | ICD-10-CM | POA: Diagnosis not present

## 2014-10-01 DIAGNOSIS — E539 Vitamin B deficiency, unspecified: Secondary | ICD-10-CM | POA: Diagnosis not present

## 2014-10-01 DIAGNOSIS — Z7952 Long term (current) use of systemic steroids: Secondary | ICD-10-CM | POA: Diagnosis not present

## 2014-10-01 DIAGNOSIS — I252 Old myocardial infarction: Secondary | ICD-10-CM | POA: Diagnosis not present

## 2014-10-01 DIAGNOSIS — E785 Hyperlipidemia, unspecified: Secondary | ICD-10-CM | POA: Diagnosis not present

## 2014-10-01 DIAGNOSIS — Z87891 Personal history of nicotine dependence: Secondary | ICD-10-CM | POA: Diagnosis not present

## 2014-10-01 NOTE — Patient Outreach (Signed)
Judith Boyer is a 69 y.o. female referred to pharmacy for medication assistance related to her new insulin prescription. Present to patient's home to provide insulin teaching, assist patient as she takes her first dose and to call with patient and discuss patient's does and titration schedule with her PCP, Dr. Everlene Farrier, per his request as reported by Judith Boyer.  Patient shows me the the NPH insulin and insulin syringes that she has obtained from [facility]. Patient provides me with contact number for Dr. Everlene Farrier [phone]) and we call the physician together. Dr. Everlene Farrier states that, while the prescription for NPH was originally written as 10 units subcutaneously each morning, after speaking with the patient, he is going to start her at 6 units subcutaneously each morning. Discuss follow up. Dr. Everlene Farrier states that he will have her call in her blood sugar results to him at the clinic, and he will then plan to increase by 2 units every 48 hours as needed. Write down for patient the dose of 6 units each morning. Patient verbalizes understanding and expresses relief to be starting at this lower dose as she is very nervous about her blood sugar going low, especially as she is usually home alone.  Again, review signs of low blood sugars and how to treat low blood sugars with the patient. Patient again demonstrates understanding through the teach back method. Watch EMMI video with patient on Insulin, including demonstration of administration technique. Patient then verbally tells me how she is going to administer her dose. Discuss injection site rotation, vial storage and reading an insulin syringe. Patient demonstrates correct dose, 6 units, on her insulin syringe. Then observe patient as she prepares site, draws up dose and gives herself her injection. Patient is anxious during this first injection, but is noticeably more comfortable once the injection is complete and reports that it did not hurt. Helped  patient label her vial with its expiration date.  While in her home, also discuss with Judith Boyer her Symbicort inhaler. Patient reports that she had been on Advair in the past, but that her Pulmonologist, Dr. Melvyn Novas, has switched her to Symbicort. Reports that she is currently using the sample inhaler that he provided. However, is concerned about whether she will be able to afford the Symbicort inhaler once she is to purchase it herself. Reports that she applied and did not qualify for Extra Help. Reports that she currently receives Advair through patient assistance charity. Let patient know that [employer], manufacturer of Symbicort, has a patient assistance program for this medication. Let her know that we can help her with this application. Let her know that this process can take several weeks. Patient reports that she wants to keep using the Symbicort a little longer to decide whether it works better for her than Advair first.  Provide patient with a [place] and advise her to use the blood sugar log within to track her blood sugars. Provided patient with my business card.  PLAN:  1) Will follow up with patient later this afternoon and see what further questions she has.  2) Will follow up with patient next week on Monday, 10/04/14, regarding her insulin administration, blood sugars and to see if she would like to proceed with the patient assistance application for Symbicort.   [name], PharmD Clinical Pharmacist [place] [facility] [phone]

## 2014-10-01 NOTE — Patient Outreach (Signed)
Called to follow up with Judith Boyer to see what her blood sugar readings have been and see if she has any further questions about her blood sugar or insulin. Patient reports that she took her blood sugar at 3:10 PM and it was 316 mg/dL. Discuss again with Judith Boyer how Dr. Everlene Farrier is planning to titrate her dose up every two days in order to bring these afternoon blood sugars down. Patient reports that she is recording her numbers in her [place] and that she will call Dr. Perfecto Kingdom office on Sunday morning to give all of her numbers and receive further direction from him about adjusting her insulin dose.   Also, again recommended to patient that she obtain glucose tablets for use should she have a low blood sugar when she is away from home.  Let Judith Boyer know that I will follow up with her again on Monday morning.  [name], PharmD Clinical Pharmacist [place] [facility] [phone]

## 2014-10-01 NOTE — Telephone Encounter (Signed)
Opened in error, duplicate.

## 2014-10-01 NOTE — Telephone Encounter (Signed)
The insulin she is on is humulin n

## 2014-10-01 NOTE — Patient Outreach (Signed)
Judith Boyer is a 69 y.o. female referred to pharmacy for medication assistance related to her insulin. Called to follow up with patient by phone to see what further questions she has and to determine if she needs additional teaching.   Patient reports that she has not yet started on her insulin because her son picked it up for her yesterday afternoon. Accordingly, patient reports that she still needs teaching on her insulin. Reports that she spoke with her PCP, Dr. Everlene Farrier, yesterday and that Dr. Everlene Farrier would like for me to be present for patient to take her first dose and that he would like for me to call and discuss with him her dose and titration plan.  Will go to meet with patient in her home now and call Dr. Everlene Farrier with the patient.   [name], PharmD Clinical Pharmacist [place] [facility] [phone]

## 2014-10-01 NOTE — Telephone Encounter (Signed)
Dr Everlene Farrier has already taken care of switching this Rx w/pharmacist.

## 2014-10-01 NOTE — Telephone Encounter (Signed)
I received a call from [facility]. Patient will be on  and start at 6 units a day to increase by 2 units every 48 hours as needed to obtain an afternoon sugar between 140 and 200. She will call here with blood sugar results.

## 2014-10-03 ENCOUNTER — Telehealth: Payer: Self-pay | Admitting: Radiology

## 2014-10-03 NOTE — Telephone Encounter (Signed)
Patient called in today to discuss her blood glucose readings.  10/01/14 8am 205  10:15p 6 units of Novalin N  12:45p 256  5:30p 221  Bedtime 168  10/02/14 8 am 156  9am 6 units Novalin N  1pm 243  5:30p 186   10:30p 190  Dr Everlene Farrier has had me to advise patient to increase Novalin N to 8 units daily and call us back with a report

## 2014-10-04 ENCOUNTER — Other Ambulatory Visit: Payer: Self-pay

## 2014-10-04 ENCOUNTER — Other Ambulatory Visit: Payer: Self-pay | Admitting: Pharmacist

## 2014-10-04 NOTE — Patient Outreach (Signed)
Called to follow up with Judith Boyer to see how she did with her insulin over the weekend and to see if she has any further questions. Note per record in EPIC that patient did call in to Dr. Perfecto Kingdom office yesterday to provide her blood glucose readings and that Dr. Everlene Farrier did have her increase her NPH insulin dose to 8 units each morning.   [name] also relays that she followed up with Dr. Everlene Farrier on Sunday. Reports that she did not have any trouble with administering the insulin. Reports that she had been a little short of breath over the weekend, but that this has been fairly consistent with how her breathing has been over the past few weeks and that it is Okay today.  Reports that she did get in touch with the [employer], the charitable group that has helped her to pay for her Advair in the past and that the representative told her that it would be no problem if she is to get the Symbicort and they would help her to pay for that instead.  PLAN:  Patient to continue to follow up with Dr. Everlene Farrier every 2 days to report blood glucose numbers and receive continued titration instruction.  Ms. Ferrer reports that she has no further questions for me today. Let her know that I will follow up with her again on 10/15/14.  [name], PharmD  Clinical Pharmacist  [facility]  [phone]

## 2014-10-04 NOTE — Patient Outreach (Addendum)
This RNCM was successful in making contact with patient to assess need for community care coordination. Patient identified herself using HIPPA identifiers. Patient stated she me with [facility] pharmacist on Friday, August 26 and was satisfied with the way the pharmacist, Janey Genta.  Patient stated she was okay at this present time. Patient and this RNCM agreed to make contact at the end of the week to schedule next home visit.    Plan: Telephone contact on Friday, October 08, 2014

## 2014-10-07 DIAGNOSIS — F419 Anxiety disorder, unspecified: Secondary | ICD-10-CM | POA: Diagnosis not present

## 2014-10-07 DIAGNOSIS — J441 Chronic obstructive pulmonary disease with (acute) exacerbation: Secondary | ICD-10-CM | POA: Diagnosis not present

## 2014-10-07 DIAGNOSIS — E539 Vitamin B deficiency, unspecified: Secondary | ICD-10-CM | POA: Diagnosis not present

## 2014-10-07 DIAGNOSIS — I252 Old myocardial infarction: Secondary | ICD-10-CM | POA: Diagnosis not present

## 2014-10-07 DIAGNOSIS — Z9981 Dependence on supplemental oxygen: Secondary | ICD-10-CM | POA: Diagnosis not present

## 2014-10-07 DIAGNOSIS — I10 Essential (primary) hypertension: Secondary | ICD-10-CM | POA: Diagnosis not present

## 2014-10-07 DIAGNOSIS — I2510 Atherosclerotic heart disease of native coronary artery without angina pectoris: Secondary | ICD-10-CM | POA: Diagnosis not present

## 2014-10-07 DIAGNOSIS — E785 Hyperlipidemia, unspecified: Secondary | ICD-10-CM | POA: Diagnosis not present

## 2014-10-07 DIAGNOSIS — E119 Type 2 diabetes mellitus without complications: Secondary | ICD-10-CM | POA: Diagnosis not present

## 2014-10-07 DIAGNOSIS — Z7952 Long term (current) use of systemic steroids: Secondary | ICD-10-CM | POA: Diagnosis not present

## 2014-10-07 DIAGNOSIS — Z87891 Personal history of nicotine dependence: Secondary | ICD-10-CM | POA: Diagnosis not present

## 2014-10-08 ENCOUNTER — Other Ambulatory Visit: Payer: Self-pay

## 2014-10-08 NOTE — Patient Outreach (Signed)
This RNCM made contact with patient via telephone call for community care coordination. Patient identified patient by providing date of birth and address.   Patient stated her blood sugars have been running the low 100s in the morning to mid 200s in the late afternoon. Patient denies having problems with administration of insulin subcutaneously.   Patient advised this RNCM would be on vacation the week of September 5 though September 9, however, there will be a Freight forwarder and can be accessed by calling [facility]'s main number.  Patient also advised that [name], [facility] would be available if need.  Patient stated she has the number for [facility] main office and Elizabeth's cell number.  Plan: Contact patient via telephone on October 19, 2014

## 2014-10-12 DIAGNOSIS — Z7952 Long term (current) use of systemic steroids: Secondary | ICD-10-CM | POA: Diagnosis not present

## 2014-10-12 DIAGNOSIS — E119 Type 2 diabetes mellitus without complications: Secondary | ICD-10-CM | POA: Diagnosis not present

## 2014-10-12 DIAGNOSIS — Z9981 Dependence on supplemental oxygen: Secondary | ICD-10-CM | POA: Diagnosis not present

## 2014-10-12 DIAGNOSIS — E785 Hyperlipidemia, unspecified: Secondary | ICD-10-CM | POA: Diagnosis not present

## 2014-10-12 DIAGNOSIS — I2510 Atherosclerotic heart disease of native coronary artery without angina pectoris: Secondary | ICD-10-CM | POA: Diagnosis not present

## 2014-10-12 DIAGNOSIS — F419 Anxiety disorder, unspecified: Secondary | ICD-10-CM | POA: Diagnosis not present

## 2014-10-12 DIAGNOSIS — E539 Vitamin B deficiency, unspecified: Secondary | ICD-10-CM | POA: Diagnosis not present

## 2014-10-12 DIAGNOSIS — J441 Chronic obstructive pulmonary disease with (acute) exacerbation: Secondary | ICD-10-CM | POA: Diagnosis not present

## 2014-10-12 DIAGNOSIS — I252 Old myocardial infarction: Secondary | ICD-10-CM | POA: Diagnosis not present

## 2014-10-12 DIAGNOSIS — Z87891 Personal history of nicotine dependence: Secondary | ICD-10-CM | POA: Diagnosis not present

## 2014-10-12 DIAGNOSIS — I10 Essential (primary) hypertension: Secondary | ICD-10-CM | POA: Diagnosis not present

## 2014-10-14 ENCOUNTER — Telehealth: Payer: Self-pay

## 2014-10-14 ENCOUNTER — Telehealth: Payer: Self-pay | Admitting: Internal Medicine

## 2014-10-14 DIAGNOSIS — J449 Chronic obstructive pulmonary disease, unspecified: Secondary | ICD-10-CM | POA: Diagnosis not present

## 2014-10-14 MED ORDER — BUDESONIDE-FORMOTEROL FUMARATE 160-4.5 MCG/ACT IN AERO: INHALATION_SPRAY | RESPIRATORY_TRACT | Status: DC

## 2014-10-14 NOTE — Telephone Encounter (Signed)
Judith Boyer with home health is needing verbal order to continue care for patient   Best number [phone]

## 2014-10-14 NOTE — Telephone Encounter (Signed)
Patient says that she was given samples of Symbicort and will run out before her appointment on 10/28/14.  Samples given to patient to hold her over until her next OV Patient notified. Nothing further needed.

## 2014-10-15 ENCOUNTER — Other Ambulatory Visit: Payer: Self-pay | Admitting: Pharmacist

## 2014-10-15 DIAGNOSIS — F419 Anxiety disorder, unspecified: Secondary | ICD-10-CM | POA: Diagnosis not present

## 2014-10-15 DIAGNOSIS — Z87891 Personal history of nicotine dependence: Secondary | ICD-10-CM | POA: Diagnosis not present

## 2014-10-15 DIAGNOSIS — J441 Chronic obstructive pulmonary disease with (acute) exacerbation: Secondary | ICD-10-CM | POA: Diagnosis not present

## 2014-10-15 DIAGNOSIS — E785 Hyperlipidemia, unspecified: Secondary | ICD-10-CM | POA: Diagnosis not present

## 2014-10-15 DIAGNOSIS — E119 Type 2 diabetes mellitus without complications: Secondary | ICD-10-CM | POA: Diagnosis not present

## 2014-10-15 DIAGNOSIS — E539 Vitamin B deficiency, unspecified: Secondary | ICD-10-CM | POA: Diagnosis not present

## 2014-10-15 DIAGNOSIS — I10 Essential (primary) hypertension: Secondary | ICD-10-CM | POA: Diagnosis not present

## 2014-10-15 DIAGNOSIS — I252 Old myocardial infarction: Secondary | ICD-10-CM | POA: Diagnosis not present

## 2014-10-15 DIAGNOSIS — Z7952 Long term (current) use of systemic steroids: Secondary | ICD-10-CM | POA: Diagnosis not present

## 2014-10-15 DIAGNOSIS — Z9981 Dependence on supplemental oxygen: Secondary | ICD-10-CM | POA: Diagnosis not present

## 2014-10-15 DIAGNOSIS — I2510 Atherosclerotic heart disease of native coronary artery without angina pectoris: Secondary | ICD-10-CM | POA: Diagnosis not present

## 2014-10-15 NOTE — Telephone Encounter (Signed)
Please give verbal order to continue care.

## 2014-10-15 NOTE — Patient Outreach (Signed)
Judith Boyer was referred to pharmacy for assistance with her insulin. Called to follow up with Judith Boyer. Left a HIPAA compliant message on the patient's voicemail. If have not heard from patient by 10/18/14, will give her another call at that time.  [name], PharmD Clinical Pharmacist [place] [facility] [phone]

## 2014-10-15 NOTE — Telephone Encounter (Signed)
Spoke with Maudie Mercury, gave verbal orders.

## 2014-10-17 DIAGNOSIS — J449 Chronic obstructive pulmonary disease, unspecified: Secondary | ICD-10-CM | POA: Diagnosis not present

## 2014-10-19 ENCOUNTER — Encounter: Payer: Self-pay | Admitting: Emergency Medicine

## 2014-10-19 ENCOUNTER — Other Ambulatory Visit: Payer: Self-pay

## 2014-10-19 ENCOUNTER — Ambulatory Visit (INDEPENDENT_AMBULATORY_CARE_PROVIDER_SITE_OTHER): Payer: Medicare Other | Admitting: Emergency Medicine

## 2014-10-19 VITALS — BP 150/62 | HR 119 | Temp 98.4°F | Resp 16 | Ht 61.0 in | Wt 97.2 lb

## 2014-10-19 DIAGNOSIS — I252 Old myocardial infarction: Secondary | ICD-10-CM | POA: Diagnosis not present

## 2014-10-19 DIAGNOSIS — E539 Vitamin B deficiency, unspecified: Secondary | ICD-10-CM | POA: Diagnosis not present

## 2014-10-19 DIAGNOSIS — Z9981 Dependence on supplemental oxygen: Secondary | ICD-10-CM | POA: Diagnosis not present

## 2014-10-19 DIAGNOSIS — Z23 Encounter for immunization: Secondary | ICD-10-CM | POA: Diagnosis not present

## 2014-10-19 DIAGNOSIS — J441 Chronic obstructive pulmonary disease with (acute) exacerbation: Secondary | ICD-10-CM | POA: Diagnosis not present

## 2014-10-19 DIAGNOSIS — I2510 Atherosclerotic heart disease of native coronary artery without angina pectoris: Secondary | ICD-10-CM | POA: Diagnosis not present

## 2014-10-19 DIAGNOSIS — Z7952 Long term (current) use of systemic steroids: Secondary | ICD-10-CM | POA: Diagnosis not present

## 2014-10-19 DIAGNOSIS — Z87891 Personal history of nicotine dependence: Secondary | ICD-10-CM | POA: Diagnosis not present

## 2014-10-19 DIAGNOSIS — I10 Essential (primary) hypertension: Secondary | ICD-10-CM | POA: Diagnosis not present

## 2014-10-19 DIAGNOSIS — E785 Hyperlipidemia, unspecified: Secondary | ICD-10-CM | POA: Diagnosis not present

## 2014-10-19 DIAGNOSIS — E119 Type 2 diabetes mellitus without complications: Secondary | ICD-10-CM | POA: Diagnosis not present

## 2014-10-19 DIAGNOSIS — F419 Anxiety disorder, unspecified: Secondary | ICD-10-CM | POA: Diagnosis not present

## 2014-10-19 NOTE — Patient Outreach (Signed)
Call made to patient to assess readiness for discharge. Patient identified herself using HIPPA identifiers by providing date of birth and address.  Patient and this RNCM reviewed the [place] created in collaboration with patient. In reviewing, patient agreed to discharge at this time due to goals being met. This RNCM reminded patient the [facility] Programs enrollment criteria.    Patient states she is not ready for hospice at this time.  States she has been compliant with medications, attendance in medical appointments.  Patient states she went to appointment with primary care physician and doctor increased her insulin to 10 units per day.  Patient stated she is comfortable with drawing up the new insulin dose, stated further, [name], [facility] Pharmacist taught her very well.  Plan: Discharge patient from caseload as her goals have been met

## 2014-10-19 NOTE — Progress Notes (Signed)
Patient ID: Judith [facility]er, female   DOB: 12-11-1945, 69 y.o.   MRN: [id number]    This chart was scribed for Judith Jordan, MD by Judith [facility]er, medical scribe at [facility].The patient was seen in exam room 21 and the patient's care was started at 11:38 AM.  Chief Complaint:  Chief Complaint  Patient presents with  . Follow-up    "feel better than she was from coming out of hosp"  . Shortness of Breath  . Blood sugars   HPI: Judith Boyer is a 69 y.o. female who reports to Judith Boyer today for a follow up. Not smoking, last cigarette was during her [facility] stay. [facility] her home 1-2 times a week. Blood sugar improving, taking 8 units of insulin daily. Also on 2000 mg metformin daily.Takes 20 mg prednisone. Weight is improving.  Past Medical History  Diagnosis Date  . COPD (chronic obstructive pulmonary disease)   . Hypertension   . Coronary artery disease   . Shortness of breath   . High cholesterol   . Myocardial infarction 02/2011  . On home oxygen therapy     "2L; 24h/day" (02/08/2014)  . Pneumonia     "couple times" (02/08/2014)  . Type II diabetes mellitus     type 2  . Anemia   . History of blood transfusion 2014    "extremely anemic"   Past Surgical History  Procedure Laterality Date  . Shoulder arthroscopy w/ rotator cuff repair Left   . Left heart catheterization with coronary angiogram N/A 02/09/2011    Procedure: LEFT HEART CATHETERIZATION WITH CORONARY ANGIOGRAM;  Surgeon: Judith Riddle, MD;  Location: [facility];  Service: Cardiovascular;  Laterality: N/A;  . Tonsillectomy    . Abdominal hysterectomy  1970's  . Coronary angioplasty with stent placement  12/2007    "2"  . Cardiac catheterization  02/2011   Social History   Social History  . Marital Status: Widowed    Spouse Name: N/A  . Number of Children: N/A  . Years of Education: N/A   Social History Main Topics  . Smoking status: Former Smoker -- 1.00  packs/day for 50 years    Types: Cigarettes    Quit date: 01/10/2013  . Smokeless tobacco: Never Used  . Alcohol Use: No  . Drug Use: No  . Sexual Activity: No   Other Topics Concern  . None   Social History Narrative   Family History  Problem Relation Age of Onset  . Heart disease Maternal Grandmother   . Brain cancer Brother   . Lung cancer Mother     smoked   Allergies  Allergen Reactions  . Doxycycline Anaphylaxis  . Codeine Other (See Comments)    hyperactivity  . Dulera [Mometasone Furo-Formoterol Fum] Hives and Rash  . Spiriva Handihaler [Tiotropium Bromide Monohydrate] Rash and Other (See Comments)    Rash and Urinary Retention  . Adhesive [Tape] Rash  . Latex Other (See Comments)    unknown  . Lisinopril Cough   Prior to Admission medications   Medication Sig Start Date End Date Taking? Authorizing Provider  albuterol (PROAIR HFA) 108 (90 BASE) MCG/ACT inhaler Inhale 2 puffs into the lungs every 4 (four) hours as needed for wheezing or shortness of breath. 08/17/14   Judith Russian, MD  ALPRAZolam Duanne Moron) 0.25 MG tablet Take 1 tablet (0.25 mg total) by mouth 2 (two) times daily. 09/21/14   Judith Russian, MD  BD PEN NEEDLE NANO U/F 32G X 4 MM MISC as directed 09/22/14   Judith Russian, MD  budesonide-formoterol [facility]) 160-4.5 MCG/ACT inhaler Take 2 puffs first thing in am and then another 2 puffs about 12 hours later. 10/14/14   Judith Rockers, MD  clopidogrel (PLAVIX) 75 MG tablet Take 1 tablet (75 mg total) by mouth every morning. Patient not taking: Reported on 10/08/2014 06/28/13   Judith Russian, MD  glucose blood (ACCU-CHEK AVIVA) test strip Check sugar in the morning and before supper 09/21/14   Judith Russian, MD  guaiFENesin (MUCINEX) 600 MG 12 hr tablet Take 600 mg by mouth daily. Take every day per patient    Historical Provider, MD  ipratropium-albuterol (DUONEB) 0.5-2.5 (3) MG/3ML SOLN Take 3 mLs by nebulization every 4 (four) hours. 09/14/14   Judith Mould, MD  Iron TABS Take 1 tablet by mouth daily.    Historical Provider, MD  metFORMIN (GLUCOPHAGE) 500 MG tablet Take 2 tablets (1,000 mg total) by mouth 2 (two) times daily with a meal. 09/14/14   Judith Mould, MD  montelukast (SINGULAIR) 10 MG tablet take 1 tablet by mouth once daily Patient taking differently: take 1 tablet by mouth once every evening 07/09/14   Judith Russian, MD  nitroGLYCERIN (NITROSTAT) 0.4 MG SL tablet Place 1 tablet (0.4 mg total) under the tongue every 5 (five) minutes x 3 doses as needed for chest pain. 02/07/12   Judith Dials, MD  pravastatin (PRAVACHOL) 80 MG tablet Take 1 tablet (80 mg total) by mouth every evening. 06/28/13   Judith Russian, MD  predniSONE (DELTASONE) 10 MG tablet Take  2 each am until better then 1 each am 09/22/14   Judith Rockers, MD  valsartan (DIOVAN) 80 MG tablet Take 1 tablet (80 mg total) by mouth daily. Patient taking differently: Take 80 mg by mouth every evening.  02/22/14   Judith Heckle English, PA    ROS: The patient denies fevers, chills, night sweats, unintentional weight loss, chest pain, palpitations, wheezing, dyspnea on exertion, nausea, vomiting, abdominal pain, dysuria, hematuria, melena, numbness, weakness, or tingling.  All other systems have been reviewed and were otherwise negative with the exception of those mentioned in the HPI and as above.    PHYSICAL EXAM: Filed Vitals:   10/19/14 1132  BP: 150/62  Pulse: 119  Temp: 98.4 F (36.9 C)  Resp: 16   Body mass index is 18.38 kg/(m^2).  General: Alert, no acute distress HEENT:  Normocephalic, atraumatic, oropharynx patent. Eye: Judith Boyer Judith Boyer Cardiovascular:  Regular rate and rhythm, no rubs murmurs or gallops.  No Carotid bruits, radial pulse intact. No pedal edema.  Respiratory: Clear to auscultation bilaterally.  No wheezes, rales, or rhonchi.  No cyanosis, no use of accessory musculature Abdominal: No organomegaly, abdomen is soft and non-tender,  positive bowel sounds.  No masses. Musculoskeletal: Gait intact. No edema, tenderness Skin: No rashes. Neurologic: Facial musculature symmetric. Psychiatric: Patient acts appropriately throughout our interaction. Lymphatic: No cervical or submandibular lymphadenopathy Genitourinary/Anorectal: No acute findings   LABS: Results for orders placed or performed during the [facility] encounter of 09/07/14  MRSA PCR Screening  Result Value Ref Range   MRSA by PCR NEGATIVE NEGATIVE  Basic metabolic panel  Result Value Ref Range   Sodium 137 135 - 145 mmol/L   Potassium 4.3 3.5 - 5.1 mmol/L   Chloride 97 (L) 101 - 111 mmol/L   CO2 30 22 - 32 mmol/L  Glucose, Bld 346 (H) 65 - 99 mg/dL   BUN 13 6 - 20 mg/dL   Creatinine, Ser 0.51 0.44 - 1.00 mg/dL   Calcium 9.4 8.9 - 10.3 mg/dL   GFR calc non Af Amer >60 >60 mL/min   GFR calc Af Amer >60 >60 mL/min   Anion gap 10 5 - 15  CBC  Result Value Ref Range   WBC 8.4 4.0 - 10.5 K/uL   RBC 4.04 3.87 - 5.11 MIL/uL   Hemoglobin 12.0 12.0 - 15.0 g/dL   HCT 37.0 36.0 - 46.0 %   MCV 91.6 78.0 - 100.0 fL   MCH 29.7 26.0 - 34.0 pg   MCHC 32.4 30.0 - 36.0 g/dL   RDW 12.5 11.5 - 15.5 %   Platelets 223 150 - 400 K/uL  Glucose, capillary  Result Value Ref Range   Glucose-Capillary 344 (H) 65 - 99 mg/dL  Glucose, capillary  Result Value Ref Range   Glucose-Capillary 128 (H) 65 - 99 mg/dL  Glucose, capillary  Result Value Ref Range   Glucose-Capillary 123 (H) 65 - 99 mg/dL   Comment 1 Document in Chart   Glucose, capillary  Result Value Ref Range   Glucose-Capillary 218 (H) 65 - 99 mg/dL   Comment 1 Document in Chart   Blood gas, arterial  Result Value Ref Range   O2 Content 2.0 L/min   Delivery systems NASAL CANNULA    pH, Arterial 7.389 7.350 - 7.450   pCO2 arterial 50.6 (H) 35.0 - 45.0 mmHg   pO2, Arterial 115 (H) 80.0 - 100.0 mmHg   Bicarbonate 29.9 (H) 20.0 - 24.0 mEq/L   TCO2 31.5 0 - 100 mmol/L   Acid-Base Excess 5.1 (H) 0.0 - 2.0  mmol/L   O2 Saturation 98.9 %   Patient temperature 98.6    Collection site LEFT RADIAL    Drawn by [id number]    Sample type ARTERIAL DRAW    Allens test (pass/fail) PASS PASS  Glucose, capillary  Result Value Ref Range   Glucose-Capillary 352 (H) 65 - 99 mg/dL  Troponin I  Result Value Ref Range   Troponin I <0.03 <0.031 ng/mL  Basic metabolic panel  Result Value Ref Range   Sodium 137 135 - 145 mmol/L   Potassium 3.9 3.5 - 5.1 mmol/L   Chloride 100 (L) 101 - 111 mmol/L   CO2 31 22 - 32 mmol/L   Glucose, Bld 206 (H) 65 - 99 mg/dL   BUN 15 6 - 20 mg/dL   Creatinine, Ser 0.48 0.44 - 1.00 mg/dL   Calcium 8.8 (L) 8.9 - 10.3 mg/dL   GFR calc non Af Amer >60 >60 mL/min   GFR calc Af Amer >60 >60 mL/min   Anion gap 6 5 - 15  Glucose, capillary  Result Value Ref Range   Glucose-Capillary 156 (H) 65 - 99 mg/dL  Glucose, capillary  Result Value Ref Range   Glucose-Capillary 166 (H) 65 - 99 mg/dL  Glucose, capillary  Result Value Ref Range   Glucose-Capillary 298 (H) 65 - 99 mg/dL  Glucose, capillary  Result Value Ref Range   Glucose-Capillary 318 (H) 65 - 99 mg/dL  Glucose, capillary  Result Value Ref Range   Glucose-Capillary 314 (H) 65 - 99 mg/dL   Comment 1 Notify RN    Comment 2 Document in Chart   Glucose, capillary  Result Value Ref Range   Glucose-Capillary 215 (H) 65 - 99 mg/dL   Comment 1 Notify RN  Glucose, capillary  Result Value Ref Range   Glucose-Capillary 341 (H) 65 - 99 mg/dL   Comment 1 Notify RN   Glucose, capillary  Result Value Ref Range   Glucose-Capillary 205 (H) 65 - 99 mg/dL  Glucose, capillary  Result Value Ref Range   Glucose-Capillary 144 (H) 65 - 99 mg/dL  Glucose, capillary  Result Value Ref Range   Glucose-Capillary 165 (H) 65 - 99 mg/dL   Comment 1 Notify RN    Comment 2 Document in Chart   Glucose, capillary  Result Value Ref Range   Glucose-Capillary 323 (H) 65 - 99 mg/dL   Comment 1 Notify RN    Comment 2 Document in Chart     Glucose, capillary  Result Value Ref Range   Glucose-Capillary 203 (H) 65 - 99 mg/dL  Glucose, capillary  Result Value Ref Range   Glucose-Capillary 110 (H) 65 - 99 mg/dL  Glucose, capillary  Result Value Ref Range   Glucose-Capillary 104 (H) 65 - 99 mg/dL   Comment 1 Notify RN    Comment 2 Document in Chart   Glucose, capillary  Result Value Ref Range   Glucose-Capillary 292 (H) 65 - 99 mg/dL   Comment 1 Notify RN    Comment 2 Document in Chart   Glucose, capillary  Result Value Ref Range   Glucose-Capillary 198 (H) 65 - 99 mg/dL  Glucose, capillary  Result Value Ref Range   Glucose-Capillary 107 (H) 65 - 99 mg/dL   Comment 1 Notify RN   Glucose, capillary  Result Value Ref Range   Glucose-Capillary 213 (H) 65 - 99 mg/dL  Glucose, capillary  Result Value Ref Range   Glucose-Capillary 244 (H) 65 - 99 mg/dL  Glucose, capillary  Result Value Ref Range   Glucose-Capillary 172 (H) 65 - 99 mg/dL   Comment 1 Notify RN    Comment 2 Document in Chart   Glucose, capillary  Result Value Ref Range   Glucose-Capillary 118 (H) 65 - 99 mg/dL  Glucose, capillary  Result Value Ref Range   Glucose-Capillary 186 (H) 65 - 99 mg/dL   EKG/XRAY:   Primary read interpreted by Dr. Everlene Farrier at [facility].  ASSESSMENT/PLAN: Sugars have been running between about 1:30 and 250. She is currently on 8 units of NPH. I told her she could increase to 10 units and then if sugars continue to be high increased to 12 units. We'll recheck in about 6 weeks. She continues on a high dose of prednisone at 20 mg a day. She continues on Glucophage 1000 mg twice a day.I personally performed the services described in this documentation, which was scribed in my presence. The recorded information has been reviewed and is accurate. Gross sideeffects, risk and benefits, and alternatives of medications d/w patient. Patient is aware that all medications have potential sideeffects and we are unable to predict every sideeffect or  drug-drug interaction that may occur.   [name] MD 10/19/2014 11:36 AM

## 2014-10-20 ENCOUNTER — Other Ambulatory Visit: Payer: Self-pay | Admitting: Pharmacist

## 2014-10-20 NOTE — Patient Outreach (Signed)
Called to follow up with Judith Boyer about her insulin dosing and administration. Patient reports that she is doing [name]. Reports that she is having some shortness of breath this morning. Reports that she has been using her Symbicort inhaler consistently and that she used her Duoneb nebulizer this morning and that she is getting ready to do another treatment at 11 AM. Reports that she does have her rescue albuterol inhaler with her. Encouraged Judith Boyer to use her albuterol inhaler now if needed.  Patient reports that per direction of Dr. Everlene Farrier, she is testing her blood sugar twice daily, morning fasting and before supper and recording these into her [name]. Reports the following most recent values from the past 4 days:  Fasting:162, 163, 184,144 Before supper: 285, 258,245  Patient reports that she saw Dr. Everlene Farrier yesterday and that he increased her NPH insulin to 10 units each morning. Patient reports that she took this dose this morning. Reports that she is still on prednisone. Patient reports that her injections are going well and she has been rotating injection sites.    Patient reports that she has no further medication questions for me today. Let patient know that I will call to follow up with her in two weeks, but to call me sooner with any questions.   [name], PharmD Clinical Pharmacist [facility] [phone]

## 2014-10-21 DIAGNOSIS — E119 Type 2 diabetes mellitus without complications: Secondary | ICD-10-CM | POA: Diagnosis not present

## 2014-10-21 DIAGNOSIS — J441 Chronic obstructive pulmonary disease with (acute) exacerbation: Secondary | ICD-10-CM | POA: Diagnosis not present

## 2014-10-21 DIAGNOSIS — Z7952 Long term (current) use of systemic steroids: Secondary | ICD-10-CM | POA: Diagnosis not present

## 2014-10-21 DIAGNOSIS — I252 Old myocardial infarction: Secondary | ICD-10-CM | POA: Diagnosis not present

## 2014-10-21 DIAGNOSIS — E539 Vitamin B deficiency, unspecified: Secondary | ICD-10-CM | POA: Diagnosis not present

## 2014-10-21 DIAGNOSIS — I10 Essential (primary) hypertension: Secondary | ICD-10-CM | POA: Diagnosis not present

## 2014-10-21 DIAGNOSIS — I2510 Atherosclerotic heart disease of native coronary artery without angina pectoris: Secondary | ICD-10-CM | POA: Diagnosis not present

## 2014-10-21 DIAGNOSIS — E785 Hyperlipidemia, unspecified: Secondary | ICD-10-CM | POA: Diagnosis not present

## 2014-10-21 DIAGNOSIS — F419 Anxiety disorder, unspecified: Secondary | ICD-10-CM | POA: Diagnosis not present

## 2014-10-21 DIAGNOSIS — Z87891 Personal history of nicotine dependence: Secondary | ICD-10-CM | POA: Diagnosis not present

## 2014-10-21 DIAGNOSIS — Z9981 Dependence on supplemental oxygen: Secondary | ICD-10-CM | POA: Diagnosis not present

## 2014-10-28 ENCOUNTER — Encounter: Payer: Self-pay | Admitting: Internal Medicine

## 2014-10-28 ENCOUNTER — Ambulatory Visit: Payer: Medicare Other | Admitting: Internal Medicine

## 2014-10-28 ENCOUNTER — Ambulatory Visit (INDEPENDENT_AMBULATORY_CARE_PROVIDER_SITE_OTHER): Payer: Medicare Other | Admitting: Internal Medicine

## 2014-10-28 VITALS — BP 130/60 | HR 122 | Ht 61.0 in | Wt 100.0 lb

## 2014-10-28 DIAGNOSIS — J9612 Chronic respiratory failure with hypercapnia: Secondary | ICD-10-CM

## 2014-10-28 DIAGNOSIS — J449 Chronic obstructive pulmonary disease, unspecified: Secondary | ICD-10-CM

## 2014-10-28 MED ORDER — BUDESONIDE-FORMOTEROL FUMARATE 160-4.5 MCG/ACT IN AERO: INHALATION_SPRAY | RESPIRATORY_TRACT | Status: DC

## 2014-10-28 MED ORDER — ACLIDINIUM BROMIDE 400 MCG/ACT IN AEPB: 1.0000 | INHALATION_SPRAY | Freq: Two times a day (BID) | RESPIRATORY_TRACT | Status: DC

## 2014-10-28 NOTE — Progress Notes (Signed)
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 On max dose advair and already took neb and combivent on day of ov and "no better" with nasal congestion as well. rec Stop advair Start dulera 200 Take 2 puffs first thing in am and then another 2 puffs about 12 hours later.  Continue duoneb 4 x daily and the Marie is only if needed The key is to stop smoking completely before smoking completely stops you!   01/27/2013 f/u ov/Wert re: copd GOLD IV/ noct 02 dep  Chief Complaint  Patient presents with  . Follow-up    Pt reports breathing is about the same- has good days and bad days.  overall better on advair 500/50 vs dulera so switched back  rec Plan A = Automatic Tudorza one twice daily immediately after advair  Ok to use advair 500 / 50 one twice daily  Plan B = Back up = Only use your albuterol (proair)  Plan C = backs up plan B = Only use your albuterol nebulizer up to every 4 hours if proair doesn't work Prednisone 10 mg take  4 each am x 2 days,   2 each am x 2 days,  1 each am x 2 days and stop  02/10/2013 f/u ov/Wert re: GOLD IV COPD/ noct 02 dep Chief Complaint  Patient presents with  . Follow-up    Pt c/o increased SOB for the past 2 wks. She states using albuterol inhaler and neb 3-4 times per day. Sometimes wakes up at night and has to use rescue medications.   Can still taste cigs in her  sputum. Not using mucinex dm "you told me to stop it"/ otherwise appears adherent Not able to afford tudorza Doe x > room to room slow walking  rec Stop tudorza and cozar Start spiriva one capsule each am> could not urinate so stopped Start diovan (valsartan) 80 mg daily in place of cozar For cough congestion use mucinex up to 1200 mg every 12 hours and as much of the flutter valve as you can    Date of Admission: 02/20/2013 Date of Discharge: 02/26/13   Primary Care Provider: Jenny Reichmann, MD  Consultants: GI, Palliative, Pulmonology  Indication for Hospitalization: Shortness of breath  Discharge Diagnoses/Problem List:  Patient Active Problem List    Diagnosis  Date Noted   .  Palliative care encounter  02/25/2013   .  Weakness generalized  02/25/2013   .  COPD exacerbation  02/20/2013   .  CAP (community acquired pneumonia)  02/20/2013   .  Anemia  02/20/2013      03/10/2013 f/u ov/Wert re: copd/ chronic resp failure/ 02 at hs 2lpm humidified  Chief Complaint  Patient presents with  . Follow-up    Pt states recovering from PNA. She c/o still feeling a little SOB  since d/c'ed from hospital on 02/26/13. She is using rescue inhaler 4-5 times per day and albuterol neb approx twice daily.   Advair 500 bid Ipatropium tid  Alb saba also  rec Continue advair for now Duoneb should be four times daily Only use your albuterol ( proair) as rescue   05/26/2013 f/u ov/Wert re: CODP GOLD III plus noct 02  Chief Complaint  Patient presents with  . Follow-up    Breathing is overall doing well. She states that she is using proair at least 2 x per day, and has not used albuterol neb recently.  uses proair first thing in am because advair and duoneb too slow to work but overall satisfied at her usual baseline doe rec F/u prn  Date of birth: [birth year]Age: 69 y.o.Gender: female Date of Admission: 8/2/2016Date of Discharge: 09/14/14 Admitting Physician:  Zenia Resides, MD  Primary Care Provider: Jenny Reichmann, MD Consultants: CCM, palliative  Indication for Hospitalization: shortness of breath  Discharge Diagnoses/Problem List:  Patient Active Problem List   Diagnosis Date Noted  . Acute on chronic respiratory failure with hypoxia   . Palliative care by specialist   . Dysphagia   . Dyspnea 08/13/2014  . Tachycardia   . Respiratory acidosis   . Coronary artery disease involving native coronary artery of native heart without angina pectoris   . Type 2 diabetes mellitus without complication   . Anxiety   . COPD with acute exacerbation 06/18/2014  . B12 deficiency 02/10/2014  . Protein-calorie malnutrition, severe 02/09/2014  . Acute respiratory distress 02/08/2014  . Vitamin B 12 deficiency 06/28/2013  . Palliative care encounter 02/25/2013  . Weakness generalized 02/25/2013  . COPD exacerbation 02/20/2013  . CAP (community acquired pneumonia) 02/20/2013  . Anemia 02/20/2013  . Nocturnal hypoxemia 12/30/2012  . Chest pain at rest 02/09/2011  . CAD in native artery 02/09/2011  . COPD GOLD III 02/06/2011  . Smoker 02/06/2011  . Hypertension 02/06/2011  . Diabetes mellitus type 2, controlled, without complications 32/95/1884     Disposition: home  Discharge Condition: stable  Discharge Exam:  General: elderly lady lying in bed eating breakfast in NAD Cardiovascular: RRR, no murmurs appreciated Respiratory: speaking in full sentences, no wheezing, wearing 2L [place]  Abdomen: soft, non-tender, non-distended, +BS Neuro: A&Ox3, no focal deficits Psych: appropriate mood and affect   Brief Hospital Course:  Patient presented with SOB. She has a PMH of COPD. She was started on duonebs scheduled q4, albuterol neb q2 PRN, levoquin, supplemental O2 (home dose of 2L [place]) and prednisone for COPD exacerbation. Patient's breathing did not improve with  these treatments, and she was transferred to stepdown unit for potential BiPAP. CCM was consulted due to concern for potential intubation. Patient's breathing actually improved without BiPAP.   Patient also experienced L shoulder pain and difficulty swallowing. Given accompanying SOB, troponin and EKG were obtained to r/u cardiac event. Both were unremarkable. Barium swallow performed for dysphagia showed no abnormalities.   Patient still did not improve, so pulmonology was consulted. They recommended an updated COPD medication regimen, which patient will be discharged on.   Given the advanced stage of her disease, palliative care was consulted. Patient was not receptive to palliative's ideas, and would not discuss code status with her family. Palliative suggested repeatedly the use of low-dose morphine for dyspnea symptomatic relief, but the patient was very opposed to this idea.   As patient's breathing was stable and back to her baseline, she was discharged with plans  for close pulmonology and PCP follow-up.  Issues for Follow Up:  1. Given need for slow prednisone taper, patient will need changes in diabetes control medication. She was on 500 mg metformin before admission. She was discharged with an increased dose of 1 g metformin BID. Could consider adding one dose glipizide in AM if not adequately controlled on metformin alone.  2. Patient will need extended prednisone taper. Suggest four week taper with pulmonology follow-up. Potentially needs chronic steroids.  3. For COPD, pt was discharged on the following medications: duonebs at least 4 times a day, advair, prednisone 60 mg (to be tapered), daliresp, singulair, and albuterol inhaler. 4. If patient's dysphagia continues, need outpatient GI appointment for repeat imaging.  5. Xanax increased to TID. Significantly improves patient's breathing status.  6. Patient was started on low-dose zoloft, but did not feel it was effective, and does  not want to try another anti-depressant despite symptoms.  7. Consider low-dose morphine for symptomatic relief dyspnea. Patient was not receptive of this idea while inpatient, but may consider broaching the topic again outpatient if dyspnea worsens.  8. Palliative care tried to discuss code status with patient repeatedly. Given patient's poor prognosis, recommend discussing code status with patient again, especially if family is present.        09/22/2014 extended post hosp f/u / transition of care f/u  ov/Wert re: copd gold III with freq exac / 02 2lpm 24/7 and pred down 20mg  since d/c  Chief Complaint  Patient presents with  . Follow-up    Pt states being discharged from hospital 1 week ago. States that breathing is better since being in hospital. Pt states increased SOB with exertion. Pt denies wheezing, cough, chest congestion or pain   had been on max doses of advair with flares/ typically start with bad cough. Baseline doe = MMRC 3 even on 02  rec Plan A = Automatic symbicort 160 Take 2 puffs first thing in am and then another 2 puffs about 12 hours later.  (or Advair)  Duoneb four times daily  Plan B = Back up = Only use your albuterol (proair) as a rescue medication Plan C = backs up plan B = Only use your albuterol nebulizer up to every 4 hours if proair doesn't work Prednisone Ceiling is 20 mg daily , but floor = 10 mg daily       10/28/2014 f/u ov/Wert re: GOLD III with freq exac = Group D / 2lpm 24/7 and symb/atrovent  Chief Complaint  Patient presents with  . Follow-up    Pt states having increased SOB just since this am.  She has been using her albuterol inhaler 2-3 x per day and neb 4 x per day.     Using duoneb qid plus symbicort but tapered pred to 10 mg daily   Doe = MMRC3 = can't walk 100 yards even at a slow pace at a flat grade s stopping due to sob  / 2lpm    No obvious patterns in day to day or daytime variabilty or assoc excess/ purulent sputum  or  cp or  chest tightness, subjective wheeze overt sinus or hb symptoms. No unusual exp hx or h/o childhood pna/ asthma or knowledge of premature birth.  Sleeping ok without nocturnal  or early am exacerbation  of respiratory  c/o's or need for noct saba. Also denies any obvious fluctuation of symptoms with weather or environmental changes or other aggravating or alleviating factors except as outlined above  Current Medications, Allergies, Complete Past Medical History, Past Surgical History, Family History, and Social History were reviewed in [employer] record.  ROS  The following are not active complaints unless bolded sore throat, dysphagia, dental problems, itching, sneezing,  nasal congestion or excess/ purulent secretions, ear ache,   fever, chills, sweats, unintended wt loss, pleuritic or exertional cp, hemoptysis,  orthopnea pnd or leg swelling, presyncope, palpitations, heartburn, abdominal pain, anorexia, nausea, vomiting, diarrhea  or change in bowel or urinary habits, change in stools or urine, dysuria,hematuria,  rash, arthralgias, visual complaints, headache, numbness weakness or ataxia or problems with walking or coordination,  change in mood/affect or memory.                    Objective:   Physical Exam  amb wf nad   01/13/2013        106  > 01/27/2013   105 > 02/10/2013 105 > 03/10/13 107 > 05/26/2013 104 > 09/22/2014 90 > 10/28/2014 100   Wt Readings from Last 3 Encounters:  12/30/12 108 lb 6.4 oz (49.17 kg)  11/13/12 109 lb (49.442 kg)  10/18/12 111 lb (50.349 kg)      HEENT mild turbinate edema.  Oropharynx no thrush or excess pnd or cobblestoning.  No JVD or cervical adenopathy. Mild accessory muscle hypertrophy. Trachea midline, nl thryroid. Chest was hyperinflated by percussion with diminished breath sounds and moderate increased exp time without wheeze. Hoover sign positive at mid inspiration. Regular rate and rhythm without murmur gallop or rub or increase  P2 or edema.  Abd: no hsm, nl excursion. Ext warm without cyanosis or clubbing.          I personally reviewed images and agree with radiology impression as follows:  CXR:   09/06/14 Hyper inflated lungs with apical scarring. No acute findings. Assessment & Plan:

## 2014-10-28 NOTE — Patient Instructions (Addendum)
Plan A = Automatic symbicort 160 Take 2 puffs first thing in am and then another 2 puffs about 12 hours later.  tudorza one twice daily after symbicort   Plan B = Back up = Only use your albuterol (proair) as a rescue medication to be used if you can't catch your breath by resting or doing a relaxed purse lip breathing pattern.  - The less you use it, the better it will work when you need it. - Ok to use up to every 4 hours if you must but call for immediate appointment if use goes up over your usual need - Don't leave home without it !!  (think of it like your spare tire for your car)   Plan C = backs up plan B = Only use your albuterol nebulizer up to every 4 hours if proair doesn't work  Prednisone Ceiling is 20 mg daily , but floor = 10 mg daily   Please schedule a follow up office visit in 4 weeks, sooner if needed - bring you insurance formulary with you for alternatives spiriva/tudorza/incruse and bring all your inhalers and neb solutions with you (active)

## 2014-10-29 DIAGNOSIS — F419 Anxiety disorder, unspecified: Secondary | ICD-10-CM | POA: Diagnosis not present

## 2014-10-29 DIAGNOSIS — Z7952 Long term (current) use of systemic steroids: Secondary | ICD-10-CM | POA: Diagnosis not present

## 2014-10-29 DIAGNOSIS — I2510 Atherosclerotic heart disease of native coronary artery without angina pectoris: Secondary | ICD-10-CM | POA: Diagnosis not present

## 2014-10-29 DIAGNOSIS — Z9981 Dependence on supplemental oxygen: Secondary | ICD-10-CM | POA: Diagnosis not present

## 2014-10-29 DIAGNOSIS — E785 Hyperlipidemia, unspecified: Secondary | ICD-10-CM | POA: Diagnosis not present

## 2014-10-29 DIAGNOSIS — E119 Type 2 diabetes mellitus without complications: Secondary | ICD-10-CM | POA: Diagnosis not present

## 2014-10-29 DIAGNOSIS — Z87891 Personal history of nicotine dependence: Secondary | ICD-10-CM | POA: Diagnosis not present

## 2014-10-29 DIAGNOSIS — E539 Vitamin B deficiency, unspecified: Secondary | ICD-10-CM | POA: Diagnosis not present

## 2014-10-29 DIAGNOSIS — I252 Old myocardial infarction: Secondary | ICD-10-CM | POA: Diagnosis not present

## 2014-10-29 DIAGNOSIS — J441 Chronic obstructive pulmonary disease with (acute) exacerbation: Secondary | ICD-10-CM | POA: Diagnosis not present

## 2014-10-29 DIAGNOSIS — I10 Essential (primary) hypertension: Secondary | ICD-10-CM | POA: Diagnosis not present

## 2014-10-31 ENCOUNTER — Encounter: Payer: Self-pay | Admitting: Internal Medicine

## 2014-10-31 NOTE — Assessment & Plan Note (Addendum)
-   quit smoking 01/2013  - hfa 90% p coaching 12/30/2012  - 12/30/2012  Walked RA x 3 laps @ 185 ft each stopped due to legs shaky, sats 94% - 01/13/2013  Walked RA x 3 laps @ 185 ft each stopped due to  End of study, sats 93%  - added tudorza 01/27/13 and d/c duoneb/ atrovent> changed to spiriva 02/10/2013 due to insurance> could not urinate so stopped it - Spirometry 02/10/2013   FEV1  0.65 (33%) ratio 45  - 09/22/2014 hfa 75% effective at baseline and near 90% with coaching > rec rechallenge with symbicort 160 2bid and daily prednisone started  - 10/28/2014 addeded turdorza  The proper method of use, as well as anticipated side effects, of a metered-dose inhaler are discussed and demonstrated to the patient. Improved effectiveness after extensive coaching during this visit to a level of approximately  90% with hfa and 90% with dpi/tudorza rx  Symptoms remain difficult to control despite apparent compliance with complex rx.  I had an extended discussion with the patient reviewing all relevant studies completed to date and  lasting 15 to 20 minutes of a 25 minute visit   1) The goal with a chronic steroid dependent illness is always arriving at the lowest effective dose that controls the disease/symptoms and not accepting a set "formula" which is based on statistics or guidelines that don't always take into account patient  variability or the natural hx of the dz in every individual patient, which may well vary over time.  For now therefore I recommend the patient maintain  Floor of 10 mg and ceiling of 20  2) trial of tudorza samples   3) Formulary restrictions will be an ongoing challenge for the forseable future and I would be happy to pick an alternative if the pt will first  provide me a list of them but pt  will need to return here for training for any new device that is required eg dpi vs hfa vs respimat.    In meantime we can always provide samples so the patient never runs out of any needed  respiratory medications.   4) Each maintenance medication was reviewed in detail including most importantly the difference between maintenance and prns and under what circumstances the prns are to be triggered using an action plan format that is not reflected in the computer generated alphabetically organized AVS.    Please see instructions for details which were reviewed in writing and the patient given a copy highlighting the part that I personally wrote and discussed at today's ov.

## 2014-10-31 NOTE — Assessment & Plan Note (Signed)
HC03 31 on 09/09/14 > see ABG 09/08/14 with pC02 =51\\ Adequate control on present rx, reviewed > no change in rx needed

## 2014-11-03 ENCOUNTER — Other Ambulatory Visit: Payer: Self-pay | Admitting: Pharmacist

## 2014-11-03 ENCOUNTER — Telehealth: Payer: Self-pay | Admitting: Internal Medicine

## 2014-11-03 NOTE — Patient Outreach (Signed)
Left a message with Mindy in Dr. Gustavus Bryant office to discuss whether the patient could use the ipratropium-albuterol nebulizer solution on a scheduled basis four times daily as an alternative to the long- acting Tudorza, as patient is reporting that she will be unable to afford the [name] inhaler.  Received a call back from Clute. Mindy reports that per Dr. Melvyn Novas "These are not equivalent (apples vs apples with oranges)so I would like her to finish the tudorza then ok to change over to the duoneb and see me w/in in Week of the change to regroup." This message from Dr. Melvyn Novas also appears in [facility] as a telephone note.  Note that Duoneb is not directly equivalent to [name]. Duoneb contains a short acting anticholinergic agent versus the long acting anticholinergic agent of Tudorza. However, Duoneb also contains the short acting beta-2 agonist, albuterol.  Will call Ms. Schreckengost back to share with her the response and instructions for her that I received from Dr. Melvyn Novas.  [name], PharmD Clinical Pharmacist [facility] [phone]

## 2014-11-03 NOTE — Telephone Encounter (Signed)
These are not equivalent (apples vs apples with oranges)  so I would like her to finish the tudorza then ok to change over to the duoneb and see me w/in in  Week of the change to regroup

## 2014-11-03 NOTE — Telephone Encounter (Signed)
Spoke with Judith Boyer w/ [facility]. She reports MW just started pt on [name]. Pt is not going to be able to afford this and also stated she did better when pt used the duoneb QID. Wants to know if pt can just go back to using the duoneb QID and stop tudorza? thanks

## 2014-11-03 NOTE — Telephone Encounter (Signed)
Called spoke with Benjamine Mola w/ [facility] and made her aware of below. She will inform pt and advise her to call us once she changes for appt. Nothing further needed

## 2014-11-03 NOTE — Patient Outreach (Signed)
Called to follow up with [name] about her blood sugar control and her COPD. [name] reports that she is doing [name]. Reports that her morning blood sugars have been running between 130 to 150 and her evening blood sugars (between 5 and 6 pm) have been running between 250 and 260. Reports that per Dr. Perfecto Kingdom instructions she again increased her NPH insulin up again yesterday morning from 10 units to 12 units each morning. Reports that she had a lower blood sugar or 95 this morning. Attributes this to not having eaten before bed, as she usually does. Reports that she did not feel low. However, reviewed lows and counseled again on treating low blood sugars. Patient reports that she is continuing to take prednisone 20 mg daily as directed by her Pulmonologist.  Patient reports that at her visit last week with her Pulmonologist, Dr. Melvyn Novas, he started her on Tudorza. Patient reports that she is concerned about having been started on this new medication due to affordability. Reports that she is unable to afford it on her own and that it is not a medication that would be paid for by the Patient Access Network (PAN) as her Symbicort is. Discuss patient assistance through the manufacturer, which would first require the patient to have applied for Extra Help through Social Security. Offer [name] assistance with completing the Extra Help and patient assistance applications. However, patient reports that at this time she would like to talk to Dr. Melvyn Novas about alternatives first, as she is concerned about being on this medication long term due to her concern about long-term affordability. Patient reports that she has been taking the [name] since seeing Dr. Melvyn Novas but that she has also felt like she had better relief when she was using the Duoneb four times daily instead. Further, she comments that the Duoneb, as it is generic, is affordable to her. Patient comments that she thought that she was advised by Dr. Melvyn Novas that  she should not use ipratropium four times daily with her Symbicort. As no interaction exists between ipratropium and Symbicort (budesonide-formoterol), let patient know that I will give Dr. Melvyn Novas a call to discuss whether the patient could use the ipratropium-albuterol nebulizer solution on a scheduled basis four times daily as an alternative to the [name]. Note that as patient is home throughout the day, she reports that she has no trouble with being adherent to using this on a scheduled basis.  Will call Dr. Gustavus Bryant office to discuss this option as an alternative for affordability and then call the patient back.  [name], PharmD Clinical Pharmacist [facility] [phone]

## 2014-11-03 NOTE — Patient Outreach (Signed)
Called back to Ms. Graybill and let her know that I left a message for Dr. Melvyn Novas asking whether she could use the ipratropium-albuterol nebulizer solution on a scheduled basis four times daily as an alternative to the long- acting Tudorza, as patient is reporting that she will be unable to afford the [name] inhaler.  Read to [name] Dr. Gustavus Bryant response, per [facility] in his office and his note in EPIC: "These are not equivalent (apples vs apples with oranges)so I would like her to finish the tudorza then ok to change over to the duoneb and see me w/in in Week of the change to regroup."   Again discussed with Ms. Johndrow how Duoneb is not directly equivalent to [name]. Duoneb contains a short acting anticholinergic agent versus the longer acting anticholinergic agent of Tudorza. However, Duoneb also contains the short acting beta-2 agonist, albuterol. Ms. Zarr verbalized understanding.  Ms. Madara reports that her next appointment with Dr. Melvyn Novas is on 11/26/14. Reports that she cannot afford to see him and pay the specialty copay sooner. However, patient previously reported that Dr. Melvyn Novas offered to provide her further samples of the [name]. Advised patient to call and ask to pick up additional samples such that she will have enough to last her until at least a week before this next appointment, as Dr. Melvyn Novas expressed wanting to see her within a week of changing over to the scheduled regimen of the Duoneb. Ms. Tesmer verbalized understanding and stated that she would do this.  Patient reports that she has no further questions for me at this time. Confirmed that the patient has my phone number. Let Ms. Nowack know that I will call to follow up with her in 1 month.  [name], PharmD Clinical Pharmacist [facility] [phone]

## 2014-11-05 DIAGNOSIS — E119 Type 2 diabetes mellitus without complications: Secondary | ICD-10-CM | POA: Diagnosis not present

## 2014-11-05 DIAGNOSIS — I10 Essential (primary) hypertension: Secondary | ICD-10-CM | POA: Diagnosis not present

## 2014-11-05 DIAGNOSIS — F419 Anxiety disorder, unspecified: Secondary | ICD-10-CM | POA: Diagnosis not present

## 2014-11-05 DIAGNOSIS — I252 Old myocardial infarction: Secondary | ICD-10-CM | POA: Diagnosis not present

## 2014-11-05 DIAGNOSIS — Z7952 Long term (current) use of systemic steroids: Secondary | ICD-10-CM | POA: Diagnosis not present

## 2014-11-05 DIAGNOSIS — E539 Vitamin B deficiency, unspecified: Secondary | ICD-10-CM | POA: Diagnosis not present

## 2014-11-05 DIAGNOSIS — Z87891 Personal history of nicotine dependence: Secondary | ICD-10-CM | POA: Diagnosis not present

## 2014-11-05 DIAGNOSIS — E785 Hyperlipidemia, unspecified: Secondary | ICD-10-CM | POA: Diagnosis not present

## 2014-11-05 DIAGNOSIS — Z9981 Dependence on supplemental oxygen: Secondary | ICD-10-CM | POA: Diagnosis not present

## 2014-11-05 DIAGNOSIS — J441 Chronic obstructive pulmonary disease with (acute) exacerbation: Secondary | ICD-10-CM | POA: Diagnosis not present

## 2014-11-05 DIAGNOSIS — I2510 Atherosclerotic heart disease of native coronary artery without angina pectoris: Secondary | ICD-10-CM | POA: Diagnosis not present

## 2014-11-09 ENCOUNTER — Encounter: Payer: Self-pay | Admitting: Emergency Medicine

## 2014-11-12 DIAGNOSIS — F419 Anxiety disorder, unspecified: Secondary | ICD-10-CM | POA: Diagnosis not present

## 2014-11-12 DIAGNOSIS — Z7952 Long term (current) use of systemic steroids: Secondary | ICD-10-CM | POA: Diagnosis not present

## 2014-11-12 DIAGNOSIS — Z9981 Dependence on supplemental oxygen: Secondary | ICD-10-CM | POA: Diagnosis not present

## 2014-11-12 DIAGNOSIS — E785 Hyperlipidemia, unspecified: Secondary | ICD-10-CM | POA: Diagnosis not present

## 2014-11-12 DIAGNOSIS — E539 Vitamin B deficiency, unspecified: Secondary | ICD-10-CM | POA: Diagnosis not present

## 2014-11-12 DIAGNOSIS — J441 Chronic obstructive pulmonary disease with (acute) exacerbation: Secondary | ICD-10-CM | POA: Diagnosis not present

## 2014-11-12 DIAGNOSIS — Z87891 Personal history of nicotine dependence: Secondary | ICD-10-CM | POA: Diagnosis not present

## 2014-11-12 DIAGNOSIS — I2510 Atherosclerotic heart disease of native coronary artery without angina pectoris: Secondary | ICD-10-CM | POA: Diagnosis not present

## 2014-11-12 DIAGNOSIS — I10 Essential (primary) hypertension: Secondary | ICD-10-CM | POA: Diagnosis not present

## 2014-11-12 DIAGNOSIS — E119 Type 2 diabetes mellitus without complications: Secondary | ICD-10-CM | POA: Diagnosis not present

## 2014-11-12 DIAGNOSIS — I252 Old myocardial infarction: Secondary | ICD-10-CM | POA: Diagnosis not present

## 2014-11-13 DIAGNOSIS — J449 Chronic obstructive pulmonary disease, unspecified: Secondary | ICD-10-CM | POA: Diagnosis not present

## 2014-11-15 ENCOUNTER — Telehealth: Payer: Self-pay

## 2014-11-15 NOTE — Telephone Encounter (Signed)
Patient stated her was out and she had to use her pro-air more. I did call in a prescription for her. She has been seeing  the pulmonary doctor. Advise Gwen she needs to see the pulmonary doctor again if she is having to overuse her inhaler.

## 2014-11-15 NOTE — Telephone Encounter (Signed)
Dr Everlene Farrier, pharm called to report that pt is getting her albuterol inhaler filled very frequently. She is asking for it this time only 17 days after last RF. Pt also gets her Symbicort, albuterol neb, and Duoneb filled, and pharm is concerned that she is needing this RFd so often. Please advise if this is OK, or send another Rx w/ comment as to how long ProAir inh should last her if you want it to last a full month, or longer.

## 2014-11-16 DIAGNOSIS — J449 Chronic obstructive pulmonary disease, unspecified: Secondary | ICD-10-CM | POA: Diagnosis not present

## 2014-11-18 ENCOUNTER — Encounter: Payer: Self-pay | Admitting: Emergency Medicine

## 2014-11-18 ENCOUNTER — Ambulatory Visit (INDEPENDENT_AMBULATORY_CARE_PROVIDER_SITE_OTHER): Payer: Medicare Other | Admitting: Emergency Medicine

## 2014-11-18 VITALS — BP 137/74 | HR 123 | Temp 98.0°F | Resp 20 | Ht 61.0 in | Wt 97.0 lb

## 2014-11-18 DIAGNOSIS — R000 Tachycardia, unspecified: Secondary | ICD-10-CM | POA: Diagnosis not present

## 2014-11-18 DIAGNOSIS — E119 Type 2 diabetes mellitus without complications: Secondary | ICD-10-CM | POA: Diagnosis not present

## 2014-11-18 DIAGNOSIS — Z794 Long term (current) use of insulin: Secondary | ICD-10-CM | POA: Diagnosis not present

## 2014-11-18 DIAGNOSIS — J432 Centrilobular emphysema: Secondary | ICD-10-CM | POA: Diagnosis not present

## 2014-11-18 DIAGNOSIS — R54 Age-related physical debility: Secondary | ICD-10-CM | POA: Diagnosis not present

## 2014-11-18 LAB — BASIC METABOLIC PANEL WITH GFR
BUN: 18 mg/dL (ref 7–25)
CHLORIDE: 96 mmol/L — AB (ref 98–110)
CO2: 30 mmol/L (ref 20–31)
CREATININE: 0.46 mg/dL — AB (ref 0.60–0.93)
Calcium: 9.6 mg/dL (ref 8.6–10.4)
GFR, Est Non African American: >89 mL/min (ref >=60)
Glucose, Bld: 166 mg/dL — ABNORMAL HIGH (ref 65–99)
POTASSIUM: 4.3 mmol/L (ref 3.5–5.3)
SODIUM: 137 mmol/L (ref 135–146)

## 2014-11-18 LAB — POCT GLYCOSYLATED HEMOGLOBIN (HGB A1C): HEMOGLOBIN A1C: 8.3

## 2014-11-18 LAB — GLUCOSE, POCT (MANUAL RESULT ENTRY): POC GLUCOSE: 204 mg/dL — AB (ref 70–99)

## 2014-11-18 NOTE — Patient Instructions (Signed)
NOVOLIN R  USE SLIDING SCALE IF GLUCOSE IS OVER 200  200-250  USE 2 UNITS  250-300  USE 4 UNITS  300-350  USE 6 UNITS  350-400  USE 8 UNITS  OVER 400 CALL OFFICE

## 2014-11-18 NOTE — Progress Notes (Signed)
This chart was scribed for Judith Queen, MD by Judith Boyer, Medical Scribe. This patient was seen in Room 22 and the patient's care was started at 11:42 AM.  Chief Complaint:  Chief Complaint  Patient presents with  . Follow-up  . COPD    HPI: Judith Boyer is a 69 y.o. female with a PMHx of HTN, CAD, and DM who reports to [facility] today for a follow up regarding her end-stage COPD.  Pt called a week ago complaining of trouble breathing, we put her on prednisone 6-day taper, and she reports that she is back on 20 mg a day. Pt indicates that she does a nebulizer treatment 4 times a day, and about 5 times of the hand-held treatment.   Pt reports that she checks her blood sugar about 2-3 times a day, and indicates that it is has not been controlled, especially since she has been on the prednisone. She reports an event when it was ranging in the 500's. Pt is asking whether she should buy regular insulin.    Pt reports symptom of palpitations at time.   Pt is requesting a flu shot.   Past Medical History  Diagnosis Date  . COPD (chronic obstructive pulmonary disease) ([place])   . Hypertension   . Coronary artery disease   . Shortness of breath   . High cholesterol   . Myocardial infarction ([place]) 02/2011  . On home oxygen therapy     "2L; 24h/day" (02/08/2014)  . Pneumonia     "couple times" (02/08/2014)  . Type II diabetes mellitus ([place])     type 2  . Anemia   . History of blood transfusion 2014    "extremely anemic"   Past Surgical History  Procedure Laterality Date  . Shoulder arthroscopy w/ rotator cuff repair Left   . Left heart catheterization with coronary angiogram N/A 02/09/2011    Procedure: LEFT HEART CATHETERIZATION WITH CORONARY ANGIOGRAM;  Surgeon: Birdie Riddle, MD;  Location: [facility] CATH LAB;  Service: Cardiovascular;  Laterality: N/A;  . Tonsillectomy    . Abdominal hysterectomy  1970's  . Coronary angioplasty with stent placement  12/2007    "2"  . Cardiac  catheterization  02/2011   Social History   Social History  . Marital Status: Widowed    Spouse Name: N/A  . Number of Children: N/A  . Years of Education: N/A   Social History Main Topics  . Smoking status: Former Smoker -- 1.00 packs/day for 50 years    Types: Cigarettes    Quit date: 01/10/2013  . Smokeless tobacco: Never Used  . Alcohol Use: No  . Drug Use: No  . Sexual Activity: No   Other Topics Concern  . None   Social History Narrative   Family History  Problem Relation Age of Onset  . Heart disease Maternal Grandmother   . Brain cancer Brother   . Lung cancer Mother     smoked   Allergies  Allergen Reactions  . Doxycycline Anaphylaxis  . Codeine Other (See Comments)    hyperactivity  . Dulera [Mometasone Furo-Formoterol Fum] Hives and Rash  . Spiriva Handihaler [Tiotropium Bromide Monohydrate] Rash and Other (See Comments)    Rash and Urinary Retention  . Adhesive [Tape] Rash  . Latex Other (See Comments)    unknown  . Lisinopril Cough   Prior to Admission medications   Medication Sig Start Date End Date Taking? Authorizing Provider  Aclidinium Bromide (TUDORZA PRESSAIR) 400  MCG/ACT AEPB Inhale 1 puff into the lungs 2 (two) times daily. One twice daily 10/28/14  Yes Tanda Rockers, MD  albuterol [facility] HFA) 108 (90 BASE) MCG/ACT inhaler Inhale 2 puffs into the lungs every 4 (four) hours as needed for wheezing or shortness of breath. 08/17/14  Yes [name], MD  ALPRAZolam Duanne Moron) 0.25 MG tablet Take 1 tablet (0.25 mg total) by mouth 2 (two) times daily. 09/21/14  Yes [name], MD  BD PEN NEEDLE NANO U/F 32G X 4 MM MISC as directed 09/22/14  Yes [name], MD  budesonide-formoterol [name]) 160-4.5 MCG/ACT inhaler Take 2 puffs first thing in am and then another 2 puffs about 12 hours later. 10/28/14  Yes Tanda Rockers, MD  clopidogrel (PLAVIX) 75 MG tablet Take 1 tablet (75 mg total) by mouth every morning. 06/28/13  Yes [name], MD    glucose blood (ACCU-CHEK AVIVA) test strip Check sugar in the morning and before supper 09/21/14  Yes [name], MD  guaiFENesin (MUCINEX) 600 MG 12 hr tablet Take 600 mg by mouth daily. Take every day per patient   Yes Historical Provider, MD  insulin NPH Human (NOVOLIN N) 100 UNIT/ML injection Inject 10 Units into the skin daily.   Yes Historical Provider, MD  ipratropium-albuterol (DUONEB) 0.5-2.5 (3) MG/3ML SOLN Take 3 mLs by nebulization every 4 (four) hours. 09/14/14  Yes Verner Mould, MD  Iron TABS Take 1 tablet by mouth daily.   Yes Historical Provider, MD  metFORMIN (GLUCOPHAGE) 500 MG tablet Take 2 tablets (1,000 mg total) by mouth 2 (two) times daily with a meal. 09/14/14  Yes Verner Mould, MD  montelukast (SINGULAIR) 10 MG tablet take 1 tablet by mouth once daily Patient taking differently: take 1 tablet by mouth once every evening 07/09/14  Yes [name], MD  nitroGLYCERIN (NITROSTAT) 0.4 MG SL tablet Place 1 tablet (0.4 mg total) under the tongue every 5 (five) minutes x 3 doses as needed for chest pain. 02/07/12  Yes Dixie Dials, MD  pravastatin (PRAVACHOL) 80 MG tablet Take 1 tablet (80 mg total) by mouth every evening. 06/28/13  Yes [name], MD  predniSONE (DELTASONE) 10 MG tablet Take  2 each am until better then 1 each am 09/22/14  Yes Tanda Rockers, MD  valsartan (DIOVAN) 80 MG tablet Take 1 tablet (80 mg total) by mouth daily. Patient taking differently: Take 80 mg by mouth every evening.  02/22/14  Yes Stephanie D English, PA     ROS: The patient has palpitations, trouble breathing, speech difficulty.   All other systems have been reviewed and were otherwise negative with the exception of those mentioned in the HPI and as above.    PHYSICAL EXAM: Filed Vitals:   11/18/14 1132  BP: 137/74  Pulse: 123  Temp: 98 F (36.7 C)  Resp: 20   Body mass index is 18.34 kg/(m^2).   General: Alert and talkative to me. No acute distress.  HEENT:   Normocephalic, atraumatic, oropharynx patent. Eye: [name] [facility] Cardiovascular: Tachycardia, no rubs murmurs or gallops.  No Carotid bruits, radial pulse intact. No pedal edema.  Respiratory: Barrel chest deformity. Marked decreased breath sound in the bases. Abdominal: No organomegaly, abdomen is soft and non-tender, positive bowel sounds.  No masses. Musculoskeletal: Gait intact. No edema, tenderness Skin: No rashes. Neurologic: Facial musculature symmetric. Psychiatric: Patient acts appropriately throughout our interaction. Lymphatic: No cervical or submandibular lymphadenopathy  LABS: Results  for orders placed or performed in visit on 11/18/14  POCT glucose (manual entry)  Result Value Ref Range   POC Glucose 204 (A) 70 - 99 mg/dl  POCT glycosylated hemoglobin (Hb A1C)  Result Value Ref Range   Hemoglobin A1C 8.3      EKG/XRAY:   Primary read interpreted by Dr. Everlene Farrier at [facility].   ASSESSMENT/PLAN: Breathing is stable. She has a persistent tachycardia. Due to her recent steroids have started a sliding scale insulin. Hopefully this will get her sugars under better control. I have made no changes in her COPD drugs. The pharmacy called and they were concerned about her refilling her albuterol every 17 days. This has been going on for about 2 years. I cannot see any way we can avoid this. By signing my name below, I, Rawaa Al Rifaie, attest that this documentation has been prepared under the direction and in the presence of Judith Queen, MD.  Judith Boyer, Medical Scribe.I personally performed the services described in this documentation, which was scribed in my presence. The recorded information has been reviewed and is accurate. 11/18/2014.  11:56 AM.   [place] sideeffects, risk and benefits, and alternatives of medications d/w patient. Patient is aware that all medications have potential sideeffects and we are unable to predict every sideeffect or drug-drug interaction that may  occur.  Judith Queen MD 11/18/2014 11:42 AM

## 2014-11-19 DIAGNOSIS — E119 Type 2 diabetes mellitus without complications: Secondary | ICD-10-CM | POA: Diagnosis not present

## 2014-11-19 DIAGNOSIS — Z9981 Dependence on supplemental oxygen: Secondary | ICD-10-CM | POA: Diagnosis not present

## 2014-11-19 DIAGNOSIS — F419 Anxiety disorder, unspecified: Secondary | ICD-10-CM | POA: Diagnosis not present

## 2014-11-19 DIAGNOSIS — Z7952 Long term (current) use of systemic steroids: Secondary | ICD-10-CM | POA: Diagnosis not present

## 2014-11-19 DIAGNOSIS — I252 Old myocardial infarction: Secondary | ICD-10-CM | POA: Diagnosis not present

## 2014-11-19 DIAGNOSIS — J441 Chronic obstructive pulmonary disease with (acute) exacerbation: Secondary | ICD-10-CM | POA: Diagnosis not present

## 2014-11-19 DIAGNOSIS — E785 Hyperlipidemia, unspecified: Secondary | ICD-10-CM | POA: Diagnosis not present

## 2014-11-19 DIAGNOSIS — I2510 Atherosclerotic heart disease of native coronary artery without angina pectoris: Secondary | ICD-10-CM | POA: Diagnosis not present

## 2014-11-19 DIAGNOSIS — I10 Essential (primary) hypertension: Secondary | ICD-10-CM | POA: Diagnosis not present

## 2014-11-19 DIAGNOSIS — E539 Vitamin B deficiency, unspecified: Secondary | ICD-10-CM | POA: Diagnosis not present

## 2014-11-19 DIAGNOSIS — Z87891 Personal history of nicotine dependence: Secondary | ICD-10-CM | POA: Diagnosis not present

## 2014-11-25 DIAGNOSIS — I252 Old myocardial infarction: Secondary | ICD-10-CM | POA: Diagnosis not present

## 2014-11-25 DIAGNOSIS — E119 Type 2 diabetes mellitus without complications: Secondary | ICD-10-CM | POA: Diagnosis not present

## 2014-11-25 DIAGNOSIS — J441 Chronic obstructive pulmonary disease with (acute) exacerbation: Secondary | ICD-10-CM | POA: Diagnosis not present

## 2014-11-25 DIAGNOSIS — I10 Essential (primary) hypertension: Secondary | ICD-10-CM | POA: Diagnosis not present

## 2014-11-25 DIAGNOSIS — F419 Anxiety disorder, unspecified: Secondary | ICD-10-CM | POA: Diagnosis not present

## 2014-11-25 DIAGNOSIS — E785 Hyperlipidemia, unspecified: Secondary | ICD-10-CM | POA: Diagnosis not present

## 2014-11-25 DIAGNOSIS — E539 Vitamin B deficiency, unspecified: Secondary | ICD-10-CM | POA: Diagnosis not present

## 2014-11-25 DIAGNOSIS — I2510 Atherosclerotic heart disease of native coronary artery without angina pectoris: Secondary | ICD-10-CM | POA: Diagnosis not present

## 2014-11-25 DIAGNOSIS — Z7952 Long term (current) use of systemic steroids: Secondary | ICD-10-CM | POA: Diagnosis not present

## 2014-11-25 DIAGNOSIS — Z9981 Dependence on supplemental oxygen: Secondary | ICD-10-CM | POA: Diagnosis not present

## 2014-11-25 DIAGNOSIS — Z87891 Personal history of nicotine dependence: Secondary | ICD-10-CM | POA: Diagnosis not present

## 2014-11-26 ENCOUNTER — Ambulatory Visit: Payer: Medicare Other | Admitting: Internal Medicine

## 2014-12-03 ENCOUNTER — Encounter: Payer: Self-pay | Admitting: Internal Medicine

## 2014-12-03 ENCOUNTER — Ambulatory Visit (INDEPENDENT_AMBULATORY_CARE_PROVIDER_SITE_OTHER): Payer: Medicare Other | Admitting: Internal Medicine

## 2014-12-03 VITALS — BP 108/60 | HR 128 | Ht 61.0 in | Wt 99.6 lb

## 2014-12-03 DIAGNOSIS — I2510 Atherosclerotic heart disease of native coronary artery without angina pectoris: Secondary | ICD-10-CM | POA: Diagnosis not present

## 2014-12-03 DIAGNOSIS — Z9981 Dependence on supplemental oxygen: Secondary | ICD-10-CM | POA: Diagnosis not present

## 2014-12-03 DIAGNOSIS — J9612 Chronic respiratory failure with hypercapnia: Secondary | ICD-10-CM | POA: Diagnosis not present

## 2014-12-03 DIAGNOSIS — J449 Chronic obstructive pulmonary disease, unspecified: Secondary | ICD-10-CM

## 2014-12-03 DIAGNOSIS — Z87891 Personal history of nicotine dependence: Secondary | ICD-10-CM | POA: Diagnosis not present

## 2014-12-03 DIAGNOSIS — Z7952 Long term (current) use of systemic steroids: Secondary | ICD-10-CM | POA: Diagnosis not present

## 2014-12-03 DIAGNOSIS — I10 Essential (primary) hypertension: Secondary | ICD-10-CM | POA: Diagnosis not present

## 2014-12-03 DIAGNOSIS — I252 Old myocardial infarction: Secondary | ICD-10-CM | POA: Diagnosis not present

## 2014-12-03 DIAGNOSIS — E785 Hyperlipidemia, unspecified: Secondary | ICD-10-CM | POA: Diagnosis not present

## 2014-12-03 DIAGNOSIS — F419 Anxiety disorder, unspecified: Secondary | ICD-10-CM | POA: Diagnosis not present

## 2014-12-03 DIAGNOSIS — E539 Vitamin B deficiency, unspecified: Secondary | ICD-10-CM | POA: Diagnosis not present

## 2014-12-03 DIAGNOSIS — E119 Type 2 diabetes mellitus without complications: Secondary | ICD-10-CM | POA: Diagnosis not present

## 2014-12-03 DIAGNOSIS — J441 Chronic obstructive pulmonary disease with (acute) exacerbation: Secondary | ICD-10-CM | POA: Diagnosis not present

## 2014-12-03 MED ORDER — TIOTROPIUM BROMIDE MONOHYDRATE 2.5 MCG/ACT IN AERS: INHALATION_SPRAY | RESPIRATORY_TRACT | Status: DC

## 2014-12-03 MED ORDER — ALBUTEROL SULFATE (2.5 MG/3ML) 0.083% IN NEBU: 2.5000 mg | INHALATION_SOLUTION | RESPIRATORY_TRACT | Status: DC | PRN

## 2014-12-03 NOTE — Patient Instructions (Addendum)
Plan A = Automatic symbicort 160 Take 2 puffs first thing in am and then another 2 puffs about 12 hours later.  spiriva 2 pff immediately After the AM dose of symbicort only   Plan B = Back up = Only use your albuterol (proair) as a rescue medication to be used if you can't catch your breath by resting or doing a relaxed purse lip breathing pattern.  - The less you use it, the better it will work when you need it. - Ok to use up to every 4 hours if you must but call for immediate appointment if use goes up over your usual need - Don't leave home without it !!  (think of it like your spare tire for your car)   Plan C = backs up plan B = Only use your albuterol nebulizer up to every 4 hours if proair doesn't work  Prednisone Ceiling is 20 mg daily , but floor = 10 mg daily   Please schedule a follow up office visit in 4 weeks, sooner if needed with meds and inhalers and nebulizers   -Add consider adding gerd rx empirically

## 2014-12-03 NOTE — Progress Notes (Signed)
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 On max dose advair and already took neb and combivent on day of ov and "no better" with nasal congestion as well. rec Stop advair Start dulera 200 Take 2 puffs first thing in am and then another 2 puffs about 12 hours later.  Continue duoneb 4 x daily and the [place] is only if needed The key is to stop smoking completely before smoking completely stops you!   01/27/2013 f/u ov/Judith Boyer re: copd GOLD IV/ noct 02 dep  Chief Complaint  Patient presents with  . Follow-up    Pt reports breathing is about the same- has good days and bad days.  overall better on advair 500/50 vs dulera so switched back  rec Plan A = Automatic Tudorza one twice daily immediately after advair  Ok to use advair 500 / 50 one twice daily  Plan B = Back up = Only use your albuterol (proair)  Plan C = backs up plan B = Only use your albuterol nebulizer up to every 4 hours if proair doesn't work Prednisone 10 mg take  4 each am x 2 days,   2 each am x 2 days,  1 each am x 2 days and stop  02/10/2013 f/u ov/Judith Boyer re: GOLD IV COPD/ noct 02 dep Chief Complaint  Patient presents with  . Follow-up    Pt c/o increased SOB for the past 2 wks. She states using albuterol inhaler and neb 3-4 times per day. Sometimes wakes up at night and has to use rescue medications.   Can still taste cigs in her  sputum. Not using mucinex dm "you told me to stop it"/ otherwise appears adherent Not able to afford tudorza Doe x > room to room slow walking  rec Stop tudorza and cozar Start spiriva one capsule each am> could not urinate so stopped Start diovan (valsartan) 80 mg daily in place of cozar For cough congestion use mucinex up to 1200 mg every 12 hours and as much of the flutter valve as you can    Date of Admission: 02/20/2013 Date of Discharge: 02/26/13   Primary Care Provider: Jenny Reichmann, MD  Consultants: GI, Palliative, Pulmonology  Indication for Hospitalization: Shortness of breath  Discharge Diagnoses/Problem List:  Patient Active Problem List    Diagnosis  Date Noted   .  Palliative care encounter  02/25/2013   .  Weakness generalized  02/25/2013   .  COPD exacerbation  02/20/2013   .  CAP (community acquired pneumonia)  02/20/2013   .  Anemia  02/20/2013      03/10/2013 f/u ov/Judith Boyer re: copd/ chronic resp failure/ 02 at hs 2lpm humidified  Chief Complaint  Patient presents with  . Follow-up    Pt states recovering from PNA. She c/o still feeling a little SOB  since d/c'ed from hospital on 02/26/13. She is using rescue inhaler 4-5 times per day and albuterol neb approx twice daily.   Advair 500 bid Ipatropium tid  Alb saba also  rec Continue advair for now Duoneb should be four times daily Only use your albuterol ( proair) as rescue   05/26/2013 f/u ov/Judith Boyer re: CODP GOLD III plus noct 02  Chief Complaint  Patient presents with  . Follow-up    Breathing is overall doing well. She states that she is using proair at least 2 x per day, and has not used albuterol neb recently.  uses proair first thing in am because advair and duoneb too slow to work but overall satisfied at her usual baseline doe rec F/u prn  Date of birth: [birth year]Age: 69 y.o.Gender: female Date of Admission: 8/2/2016Date of Discharge: 09/14/14 Admitting Physician:  Zenia Resides, MD  Primary Care Provider: Jenny Reichmann, MD Consultants: CCM, palliative  Indication for Hospitalization: shortness of breath  Discharge Diagnoses/Problem List:  Patient Active Problem List   Diagnosis Date Noted  . Acute on chronic respiratory failure with hypoxia   . Palliative care by specialist   . Dysphagia   . Dyspnea 08/13/2014  . Tachycardia   . Respiratory acidosis   . Coronary artery disease involving native coronary artery of native heart without angina pectoris   . Type 2 diabetes mellitus without complication   . Anxiety   . COPD with acute exacerbation 06/18/2014  . B12 deficiency 02/10/2014  . Protein-calorie malnutrition, severe 02/09/2014  . Acute respiratory distress 02/08/2014  . Vitamin B 12 deficiency 06/28/2013  . Palliative care encounter 02/25/2013  . Weakness generalized 02/25/2013  . COPD exacerbation 02/20/2013  . CAP (community acquired pneumonia) 02/20/2013  . Anemia 02/20/2013  . Nocturnal hypoxemia 12/30/2012  . Chest pain at rest 02/09/2011  . CAD in native artery 02/09/2011  . COPD GOLD III 02/06/2011  . Smoker 02/06/2011  . Hypertension 02/06/2011  . Diabetes mellitus type 2, controlled, without complications 27/51/7001     Disposition: home  Discharge Condition: stable  Discharge Exam:  General: elderly lady lying in bed eating breakfast in NAD Cardiovascular: RRR, no murmurs appreciated Respiratory: speaking in full sentences, no wheezing, wearing 2L [place]  Abdomen: soft, non-tender, non-distended, +BS Neuro: A&Ox3, no focal deficits Psych: appropriate mood and affect   Brief Hospital Course:  Patient presented with SOB. She has a PMH of COPD. She was started on duonebs scheduled q4, albuterol neb q2 PRN, levoquin, supplemental O2 (home dose of 2L [place]) and prednisone for COPD exacerbation. Patient's breathing did not improve with  these treatments, and she was transferred to stepdown unit for potential BiPAP. CCM was consulted due to concern for potential intubation. Patient's breathing actually improved without BiPAP.   Patient also experienced L shoulder pain and difficulty swallowing. Given accompanying SOB, troponin and EKG were obtained to r/u cardiac event. Both were unremarkable. Barium swallow performed for dysphagia showed no abnormalities.   Patient still did not improve, so pulmonology was consulted. They recommended an updated COPD medication regimen, which patient will be discharged on.   Given the advanced stage of her disease, palliative care was consulted. Patient was not receptive to palliative's ideas, and would not discuss code status with her family. Palliative suggested repeatedly the use of low-dose morphine for dyspnea symptomatic relief, but the patient was very opposed to this idea.   As patient's breathing was stable and back to her baseline, she was discharged with plans  for close pulmonology and PCP follow-up.  Issues for Follow Up:  1. Given need for slow prednisone taper, patient will need changes in diabetes control medication. She was on 500 mg metformin before admission. She was discharged with an increased dose of 1 g metformin BID. Could consider adding one dose glipizide in AM if not adequately controlled on metformin alone.  2. Patient will need extended prednisone taper. Suggest four week taper with pulmonology follow-up. Potentially needs chronic steroids.  3. For COPD, pt was discharged on the following medications: duonebs at least 4 times a day, advair, prednisone 60 mg (to be tapered), daliresp, singulair, and albuterol inhaler. 4. If patient's dysphagia continues, need outpatient GI appointment for repeat imaging.  5. Xanax increased to TID. Significantly improves patient's breathing status.  6. Patient was started on low-dose zoloft, but did not feel it was effective, and does  not want to try another anti-depressant despite symptoms.  7. Consider low-dose morphine for symptomatic relief dyspnea. Patient was not receptive of this idea while inpatient, but may consider broaching the topic again outpatient if dyspnea worsens.  8. Palliative care tried to discuss code status with patient repeatedly. Given patient's poor prognosis, recommend discussing code status with patient again, especially if family is present.        09/22/2014 extended post hosp f/u / transition of care f/u  ov/Daiquan Resnik re: copd gold III with freq exac / 02 2lpm 24/7 and pred down 20mg  since d/c  Chief Complaint  Patient presents with  . Follow-up    Pt states being discharged from hospital 1 week ago. States that breathing is better since being in hospital. Pt states increased SOB with exertion. Pt denies wheezing, cough, chest congestion or pain   had been on max doses of advair with flares/ typically start with bad cough. Baseline doe = MMRC 3 even on 02  rec Plan A = Automatic symbicort 160 Take 2 puffs first thing in am and then another 2 puffs about 12 hours later.  (or Advair)  Duoneb four times daily  Plan B = Back up = Only use your albuterol (proair) as a rescue medication Plan C = backs up plan B = Only use your albuterol nebulizer up to every 4 hours if proair doesn't work Prednisone Ceiling is 20 mg daily , but floor = 10 mg daily       10/28/2014 f/u ov/Maricella Filyaw re: GOLD III with freq exac = Group D / 2lpm 24/7 and symb/atrovent  Chief Complaint  Patient presents with  . Follow-up    Pt states having increased SOB just since this am.  She has been using her albuterol inhaler 2-3 x per day and neb 4 x per day.    Using duoneb qid plus symbicort but tapered pred to 10 mg daily  Doe = MMRC3 = can't walk 100 yards even at a slow pace at a flat grade s stopping due to sob  / 2lpm  rec Plan A = Automatic symbicort 160 Take 2 puffs first thing in am and then another 2 puffs about 12  hours later.  tudorza one twice daily after symbicort  Plan B = Back up = Only use your albuterol (proair) Plan C = backs up plan B = Only use your albuterol nebulizer up to every 4 hours if proair doesn't work Prednisone Ceiling is 20 mg daily , but floor = 10 mg daily  Please schedule a follow up office visit in 4 weeks, sooner  if needed - bring your insurance formulary with you for alternatives spiriva/tudorza/incruse and bring all your inhalers and neb solutions with you (active)     12/03/2014  f/u ov/Jhace Fennell re: GOLD III criteria/ 02 dep 2 lpm 24/7 / did not bring meds or formulary  Chief Complaint  Patient presents with  . Follow-up    Pt states that her breathing is unchanged- has good days and bad days. She is using albuterol inhaler 4 x daily on average and duoneb txs 1-2 x per day.   did not fill rx for tudorza  / still on pred 20 mg daily   No obvious patterns in day to day or daytime variabilty or assoc excess/ purulent sputum  or  cp or chest tightness, subjective wheeze overt sinus or hb symptoms. No unusual exp hx or h/o childhood pna/ asthma or knowledge of premature birth.  Sleeping ok without nocturnal  or early am exacerbation  of respiratory  c/o's or need for noct saba. Also denies any obvious fluctuation of symptoms with weather or environmental changes or other aggravating or alleviating factors except as outlined above   Current Medications, Allergies, Complete Past Medical History, Past Surgical History, Family History, and Social History were reviewed in [employer] record.  ROS  The following are not active complaints unless bolded sore throat, dysphagia, dental problems, itching, sneezing,  nasal congestion or excess/ purulent secretions, ear ache,   fever, chills, sweats, unintended wt loss, pleuritic or exertional cp, hemoptysis,  orthopnea pnd or leg swelling, presyncope, palpitations, heartburn, abdominal pain, anorexia, nausea, vomiting,  diarrhea  or change in bowel or urinary habits, change in stools or urine, dysuria,hematuria,  rash, arthralgias, visual complaints, headache, numbness weakness or ataxia or problems with walking or coordination,  change in mood/affect or memory.           Objective:   Physical Exam  amb wf nad / vital signs reviewed  - tachycardia p saba noted   01/13/2013        106  > 01/27/2013   105 > 02/10/2013 105 > 03/10/13 107 > 05/26/2013 104 > 09/22/2014 90 > 10/28/2014 100  > 12/03/2014 100  Wt Readings from Last 3 Encounters:  12/30/12 108 lb 6.4 oz (49.17 kg)  11/13/12 109 lb (49.442 kg)  10/18/12 111 lb (50.349 kg)      HEENT mild turbinate edema.  Oropharynx no thrush or excess pnd or cobblestoning.  No JVD or cervical adenopathy. Mild accessory muscle hypertrophy. Trachea midline, nl thryroid. Chest was hyperinflated by percussion with diminished breath sounds and moderate increased exp time without wheeze. Hoover sign positive at mid inspiration. Regular rate and rhythm without murmur gallop or rub or increase P2 or edema.  Abd: no hsm, nl excursion. Ext warm without cyanosis or clubbing.          I personally reviewed images and agree with radiology impression as follows:  CXR:   09/06/14 Hyper inflated lungs with apical scarring. No acute findings. Assessment & Plan:

## 2014-12-04 ENCOUNTER — Encounter: Payer: Self-pay | Admitting: Internal Medicine

## 2014-12-04 NOTE — Assessment & Plan Note (Signed)
HC03 31 on 09/09/14 > see ABG 09/08/14 with pC02 =51  Adequate control on present rx, reviewed > no change in rx needed

## 2014-12-04 NOTE — Progress Notes (Signed)
Please schedule with myself or Dr. Everlene Farrier in 104 ASAP.  If Dr. Everlene Farrier is not available, advise that she come and see me on a day that Daub is working at [employer] so I may discuss her case with him in person.  Philis Fendt, MS, PA-C   8:19 PM, 12/04/2014

## 2014-12-04 NOTE — Assessment & Plan Note (Addendum)
- quit smoking 01/2013  - hfa 90% p coaching 12/30/2012  - 12/30/2012  Walked RA x 3 laps @ 185 ft each stopped due to legs shaky, sats 94% - 01/13/2013  Walked RA x 3 laps @ 185 ft each stopped due to  End of study, sats 93%  - added tudorza 01/27/13 and d/c duoneb/ atrovent> changed to spiriva 02/10/2013 due to insurance> could not urinate so stopped it - Spirometry 02/10/2013   FEV1  0.65 (33%) ratio 45  - 09/22/2014 hfa 75% effective at baseline and near 90% with coaching > rec rechallenge with symbicort 160 2bid and daily prednisone started  - 10/28/2014 addeded tudorza> no better - trial of spiriva respimat 12/03/2014    DDX of  difficult airways management all start with A and  include Adherence, Ace Inhibitors, Acid Reflux, Active Sinus Disease, Alpha 1 Antitripsin deficiency, Anxiety masquerading as Airways dz,  ABPA,  allergy(esp in young), Aspiration (esp in elderly), Adverse effects of meds,  Active smokers, A bunch of PE's (a small clot burden can't cause this syndrome unless there is already severe underlying pulm or vascular dz with poor reserve) plus two Bs  = Bronchiectasis and Beta blocker use..and one C= CHF   Adherence is always the initial "prime suspect" and is a multilayered concern that requires a "trust but verify" approach in every patient - starting with knowing how to use medications, especially inhalers, correctly, keeping up with refills and understanding the fundamental difference between maintenance and prns vs those medications only taken for a very short course and then stopped and not refilled.  - The proper method of use, as well as anticipated side effects, of a metered-dose inhaler are discussed and demonstrated to the patient. Improved effectiveness after extensive coaching during this visit to a level of approximately  90% with hfa and respimat so will try LAMA in the form of spiriva respimat so the result is that both her LABA and her LAMA are both inhaled exact same  way.  ? Allergy/ asthmatic component > continue singulair and pred ceiling/ floor reviewed: The goal with a chronic steroid dependent illness is always arriving at the lowest effective dose that controls the disease/symptoms and not accepting a set "formula" which is based on statistics or guidelines that don't always take into account patient  variability or the natural hx of the dz in every individual patient, which may well vary over time.  For now therefore I recommend the patient maintain  20 as ceiling and 10 mg as floor  ? Acid (or non-acid) GERD > always difficult to exclude as up to 75% of pts in some series report no assoc GI/ Heartburn symptoms> cosider next  max (24h)  acid suppression and diet restrictions/ reviewed and instructions given in writing.   ? Anxiety > usually at the bottom of this list of usual suspects but should be much higher on this pt's based on H and P and note already on psychotropics . I reviewed with the patient the concept of air trapping and emphasized that if she become short of breath she should sit and rest prior to albuterol as the more panicky she gets the faster she agrees and the more sheer traps and albuterol really is not an advantage to her in this setting and making her tachycardic as well.    I had an extended discussion with the patient and daughter reviewing all relevant studies completed to date and  lasting 15 to 20 minutes of  a 25 minute visit    Each maintenance medication was reviewed in detail including most importantly the difference between maintenance and prns and under what circumstances the prns are to be triggered using an action plan format that is not reflected in the computer generated alphabetically organized AVS.    Please see instructions for details which were reviewed in writing and the patient given a copy highlighting the part that I personally wrote and discussed at today's ov.

## 2014-12-06 ENCOUNTER — Other Ambulatory Visit: Payer: Self-pay | Admitting: Pharmacist

## 2014-12-06 NOTE — Patient Outreach (Signed)
Called to follow up with Judith Boyer about her breathing and blood sugars. Left a HIPAA compliant message on the patient's voicemail. If have not heard from patient within a week, will give her another call at that time.   [name], PharmD Clinical Pharmacist [facility] [phone]

## 2014-12-10 DIAGNOSIS — E785 Hyperlipidemia, unspecified: Secondary | ICD-10-CM | POA: Diagnosis not present

## 2014-12-10 DIAGNOSIS — E119 Type 2 diabetes mellitus without complications: Secondary | ICD-10-CM | POA: Diagnosis not present

## 2014-12-10 DIAGNOSIS — Z87891 Personal history of nicotine dependence: Secondary | ICD-10-CM | POA: Diagnosis not present

## 2014-12-10 DIAGNOSIS — J441 Chronic obstructive pulmonary disease with (acute) exacerbation: Secondary | ICD-10-CM | POA: Diagnosis not present

## 2014-12-10 DIAGNOSIS — Z9981 Dependence on supplemental oxygen: Secondary | ICD-10-CM | POA: Diagnosis not present

## 2014-12-10 DIAGNOSIS — I10 Essential (primary) hypertension: Secondary | ICD-10-CM | POA: Diagnosis not present

## 2014-12-10 DIAGNOSIS — E539 Vitamin B deficiency, unspecified: Secondary | ICD-10-CM | POA: Diagnosis not present

## 2014-12-10 DIAGNOSIS — Z7952 Long term (current) use of systemic steroids: Secondary | ICD-10-CM | POA: Diagnosis not present

## 2014-12-10 DIAGNOSIS — I252 Old myocardial infarction: Secondary | ICD-10-CM | POA: Diagnosis not present

## 2014-12-10 DIAGNOSIS — F419 Anxiety disorder, unspecified: Secondary | ICD-10-CM | POA: Diagnosis not present

## 2014-12-10 DIAGNOSIS — I2510 Atherosclerotic heart disease of native coronary artery without angina pectoris: Secondary | ICD-10-CM | POA: Diagnosis not present

## 2014-12-13 ENCOUNTER — Other Ambulatory Visit: Payer: Self-pay | Admitting: Pharmacist

## 2014-12-13 NOTE — Patient Outreach (Addendum)
Called to follow up with Judith Boyer about her breathing and blood sugars. Patient reports that she is short of breath today. Reports that she is currently wearing her oxygen. Encourage patient to rest. Reports that Dr. Melvyn Novas changed her back from [name] to [place] for cost savings. Reports that she has not had trouble with the same side effects with this trial of Spiriva as she did last time. Reports that she continues to take Symbicort. Reports that she was getting ready to use an albuterol nebulizer treatment. Reports that she also used one earlier this morning.Encourage patient to do this now as soon as we hang up.  Reports that when she last saw Dr. Everlene Farrier in mid-October, he put her on a prednisone taper for her breathing. Reports that her blood sugar was varying greatly and have continued to do so since that time. Reports that at that time Dr. Everlene Farrier provided her with sliding scale regular insulin to use only if needed for elevated blood sugars. Reports that she can recognize her symptoms when her blood sugar is high. Again reviewed with patient how to treat lows. Judith Boyer has her next follow up visit scheduled with Dr. Everlene Farrier on 12/31/14.  Judith Boyer reports that she has no further questions for me at this time. Confirm that Judith Boyer has my phone number. Let the patient know that I will stop following her for now and ask that she please call with any questions.    Judith Boyer, PharmD Clinical Pharmacist [facility] [phone]

## 2014-12-14 DIAGNOSIS — J449 Chronic obstructive pulmonary disease, unspecified: Secondary | ICD-10-CM | POA: Diagnosis not present

## 2014-12-15 DIAGNOSIS — E119 Type 2 diabetes mellitus without complications: Secondary | ICD-10-CM | POA: Diagnosis not present

## 2014-12-15 DIAGNOSIS — I2510 Atherosclerotic heart disease of native coronary artery without angina pectoris: Secondary | ICD-10-CM | POA: Diagnosis not present

## 2014-12-15 DIAGNOSIS — E539 Vitamin B deficiency, unspecified: Secondary | ICD-10-CM | POA: Diagnosis not present

## 2014-12-15 DIAGNOSIS — Z9981 Dependence on supplemental oxygen: Secondary | ICD-10-CM | POA: Diagnosis not present

## 2014-12-15 DIAGNOSIS — F419 Anxiety disorder, unspecified: Secondary | ICD-10-CM | POA: Diagnosis not present

## 2014-12-15 DIAGNOSIS — Z87891 Personal history of nicotine dependence: Secondary | ICD-10-CM | POA: Diagnosis not present

## 2014-12-15 DIAGNOSIS — I252 Old myocardial infarction: Secondary | ICD-10-CM | POA: Diagnosis not present

## 2014-12-15 DIAGNOSIS — J441 Chronic obstructive pulmonary disease with (acute) exacerbation: Secondary | ICD-10-CM | POA: Diagnosis not present

## 2014-12-15 DIAGNOSIS — I10 Essential (primary) hypertension: Secondary | ICD-10-CM | POA: Diagnosis not present

## 2014-12-15 DIAGNOSIS — Z7952 Long term (current) use of systemic steroids: Secondary | ICD-10-CM | POA: Diagnosis not present

## 2014-12-15 DIAGNOSIS — E785 Hyperlipidemia, unspecified: Secondary | ICD-10-CM | POA: Diagnosis not present

## 2014-12-15 NOTE — Patient Outreach (Signed)
[facility]) Care Management  12/15/2014  Judith Boyer 07/20/1945 [id number]   Notification from [name], PharmD to close case due to goals met with [facility].  Thanks, Ronnell Freshwater. [place], [place] Assistant Phone: [phone] Fax: [phone]

## 2014-12-17 ENCOUNTER — Telehealth: Payer: Self-pay | Admitting: Internal Medicine

## 2014-12-17 DIAGNOSIS — J449 Chronic obstructive pulmonary disease, unspecified: Secondary | ICD-10-CM | POA: Diagnosis not present

## 2014-12-17 DIAGNOSIS — R0602 Shortness of breath: Secondary | ICD-10-CM

## 2014-12-17 DIAGNOSIS — J438 Other emphysema: Secondary | ICD-10-CM

## 2014-12-17 MED ORDER — PREDNISONE 10 MG PO TABS: ORAL_TABLET | ORAL | Status: DC

## 2014-12-17 MED ORDER — ALBUTEROL SULFATE (2.5 MG/3ML) 0.083% IN NEBU: 2.5000 mg | INHALATION_SOLUTION | RESPIRATORY_TRACT | Status: DC | PRN

## 2014-12-17 NOTE — Telephone Encounter (Signed)
Patient having a hard time with cough.  Has a lot of mucus, having to cough really hard to get the mucus out.  She says she is coughing so hard she looses her breath.  She said that she started the Spiriva on 12/03/14.  She said that every since she started the Spiriva she has a lot of mucus build up and she wants to know if this is the cause.  Wheezing, she takes Mucinex Max Strength 12 hr pills 1 in AM and 1 in pm, not helping.  Wants to know what she can do? Pharmacy: [facility] - [place].  Allergies  Allergen Reactions  . Doxycycline Anaphylaxis  . Codeine Other (See Comments)    hyperactivity  . Dulera [Mometasone Furo-Formoterol Fum] Hives and Rash  . Spiriva Handihaler [Tiotropium Bromide Monohydrate] Rash and Other (See Comments)    Rash and Urinary Retention  . Adhesive [Tape] Rash  . Latex Other (See Comments)    unknown  . Lisinopril Cough

## 2014-12-17 NOTE — Telephone Encounter (Signed)
Doubt spiriva causing problem in respimat form but ok to try off for a few days to see and in meantime increase the pred to 20 mg per day and f/u with me or Tammy NP w/in a week and use the neb up to every 4 hours if needed    For cough because cough causes reflux we also rec   prilosec otc 20mg   Take 30-60 min before first meal of the day and Pepcid ac (famotidine) 20 mg one @  bedtime until cough is completely gone for at least a week without the need for cough suppression

## 2014-12-17 NOTE — Telephone Encounter (Signed)
Patient notified of Dr. Gustavus Bryant recommendations. Patient requires afternoon appointment around 2pm.   Estill Bamberg, where can I add this patient on the schedule for TP?  Please advise.

## 2014-12-20 ENCOUNTER — Encounter (HOSPITAL_COMMUNITY): Payer: Self-pay | Admitting: General Practice

## 2014-12-20 ENCOUNTER — Telehealth: Payer: Self-pay

## 2014-12-20 ENCOUNTER — Emergency Department (HOSPITAL_COMMUNITY): Payer: Medicare Other

## 2014-12-20 ENCOUNTER — Inpatient Hospital Stay (HOSPITAL_COMMUNITY)
Admission: EM | Admit: 2014-12-20 | Discharge: 2014-12-31 | DRG: 190 | Disposition: A | Discharge status: Skilled Nursing Facility | Payer: Medicare Other | Attending: Internal Medicine | Admitting: Internal Medicine

## 2014-12-20 DIAGNOSIS — Z955 Presence of coronary angioplasty implant and graft: Secondary | ICD-10-CM

## 2014-12-20 DIAGNOSIS — F17210 Nicotine dependence, cigarettes, uncomplicated: Secondary | ICD-10-CM | POA: Diagnosis not present

## 2014-12-20 DIAGNOSIS — K5900 Constipation, unspecified: Secondary | ICD-10-CM | POA: Diagnosis not present

## 2014-12-20 DIAGNOSIS — Z66 Do not resuscitate: Secondary | ICD-10-CM | POA: Diagnosis not present

## 2014-12-20 DIAGNOSIS — I10 Essential (primary) hypertension: Secondary | ICD-10-CM | POA: Diagnosis not present

## 2014-12-20 DIAGNOSIS — R531 Weakness: Secondary | ICD-10-CM

## 2014-12-20 DIAGNOSIS — J441 Chronic obstructive pulmonary disease with (acute) exacerbation: Secondary | ICD-10-CM | POA: Diagnosis not present

## 2014-12-20 DIAGNOSIS — Z794 Long term (current) use of insulin: Secondary | ICD-10-CM

## 2014-12-20 DIAGNOSIS — Z681 Body mass index (BMI) 19 or less, adult: Secondary | ICD-10-CM | POA: Diagnosis not present

## 2014-12-20 DIAGNOSIS — E43 Unspecified severe protein-calorie malnutrition: Secondary | ICD-10-CM | POA: Diagnosis present

## 2014-12-20 DIAGNOSIS — I252 Old myocardial infarction: Secondary | ICD-10-CM | POA: Diagnosis not present

## 2014-12-20 DIAGNOSIS — Z9119 Patient's noncompliance with other medical treatment and regimen: Secondary | ICD-10-CM

## 2014-12-20 DIAGNOSIS — R0600 Dyspnea, unspecified: Secondary | ICD-10-CM

## 2014-12-20 DIAGNOSIS — Z91199 Patient's noncompliance with other medical treatment and regimen due to unspecified reason: Secondary | ICD-10-CM | POA: Diagnosis present

## 2014-12-20 DIAGNOSIS — Z888 Allergy status to other drugs, medicaments and biological substances status: Secondary | ICD-10-CM

## 2014-12-20 DIAGNOSIS — I2510 Atherosclerotic heart disease of native coronary artery without angina pectoris: Secondary | ICD-10-CM | POA: Diagnosis not present

## 2014-12-20 DIAGNOSIS — F419 Anxiety disorder, unspecified: Secondary | ICD-10-CM | POA: Diagnosis not present

## 2014-12-20 DIAGNOSIS — Z7902 Long term (current) use of antithrombotics/antiplatelets: Secondary | ICD-10-CM

## 2014-12-20 DIAGNOSIS — Z7952 Long term (current) use of systemic steroids: Secondary | ICD-10-CM

## 2014-12-20 DIAGNOSIS — J9621 Acute and chronic respiratory failure with hypoxia: Secondary | ICD-10-CM | POA: Diagnosis not present

## 2014-12-20 DIAGNOSIS — Z91040 Latex allergy status: Secondary | ICD-10-CM

## 2014-12-20 DIAGNOSIS — Z9114 Patient's other noncompliance with medication regimen: Secondary | ICD-10-CM

## 2014-12-20 DIAGNOSIS — R0602 Shortness of breath: Secondary | ICD-10-CM | POA: Diagnosis not present

## 2014-12-20 DIAGNOSIS — T443X6A Underdosing of other parasympatholytics [anticholinergics and antimuscarinics] and spasmolytics, initial encounter: Secondary | ICD-10-CM | POA: Diagnosis not present

## 2014-12-20 DIAGNOSIS — IMO0001 Reserved for inherently not codable concepts without codable children: Secondary | ICD-10-CM | POA: Diagnosis present

## 2014-12-20 DIAGNOSIS — Z720 Tobacco use: Secondary | ICD-10-CM | POA: Diagnosis not present

## 2014-12-20 DIAGNOSIS — E1165 Type 2 diabetes mellitus with hyperglycemia: Secondary | ICD-10-CM | POA: Diagnosis not present

## 2014-12-20 DIAGNOSIS — J9622 Acute and chronic respiratory failure with hypercapnia: Secondary | ICD-10-CM | POA: Diagnosis present

## 2014-12-20 DIAGNOSIS — J449 Chronic obstructive pulmonary disease, unspecified: Secondary | ICD-10-CM | POA: Diagnosis not present

## 2014-12-20 DIAGNOSIS — E118 Type 2 diabetes mellitus with unspecified complications: Secondary | ICD-10-CM | POA: Diagnosis present

## 2014-12-20 DIAGNOSIS — Z7951 Long term (current) use of inhaled steroids: Secondary | ICD-10-CM

## 2014-12-20 DIAGNOSIS — E875 Hyperkalemia: Secondary | ICD-10-CM | POA: Diagnosis present

## 2014-12-20 DIAGNOSIS — Z9981 Dependence on supplemental oxygen: Secondary | ICD-10-CM

## 2014-12-20 DIAGNOSIS — Z7189 Other specified counseling: Secondary | ICD-10-CM

## 2014-12-20 DIAGNOSIS — G4700 Insomnia, unspecified: Secondary | ICD-10-CM | POA: Diagnosis not present

## 2014-12-20 DIAGNOSIS — D649 Anemia, unspecified: Secondary | ICD-10-CM | POA: Diagnosis not present

## 2014-12-20 DIAGNOSIS — R05 Cough: Secondary | ICD-10-CM | POA: Diagnosis not present

## 2014-12-20 DIAGNOSIS — E7800 Pure hypercholesterolemia, unspecified: Secondary | ICD-10-CM | POA: Diagnosis present

## 2014-12-20 DIAGNOSIS — Z91048 Other nonmedicinal substance allergy status: Secondary | ICD-10-CM

## 2014-12-20 DIAGNOSIS — Z91128 Patient's intentional underdosing of medication regimen for other reason: Secondary | ICD-10-CM | POA: Diagnosis not present

## 2014-12-20 DIAGNOSIS — Z885 Allergy status to narcotic agent status: Secondary | ICD-10-CM

## 2014-12-20 DIAGNOSIS — Z87891 Personal history of nicotine dependence: Secondary | ICD-10-CM | POA: Diagnosis not present

## 2014-12-20 DIAGNOSIS — Z881 Allergy status to other antibiotic agents status: Secondary | ICD-10-CM

## 2014-12-20 LAB — CBC WITH DIFFERENTIAL/PLATELET
BASOS ABS: 0 10*3/uL (ref 0.0–0.1)
BASOS PCT: 0 %
EOS ABS: 0 10*3/uL (ref 0.0–0.7)
Eosinophils Relative: 0 %
HCT: 37.6 % (ref 36.0–46.0)
HEMOGLOBIN: 11.5 g/dL — AB (ref 12.0–15.0)
Lymphocytes Relative: 6 %
Lymphs Abs: 0.7 10*3/uL (ref 0.7–4.0)
MCH: 28.5 pg (ref 26.0–34.0)
MCHC: 30.6 g/dL (ref 30.0–36.0)
MCV: 93.3 fL (ref 78.0–100.0)
MONOS PCT: 3 %
Monocytes Absolute: 0.4 10*3/uL (ref 0.1–1.0)
NEUTROS PCT: 91 %
Neutro Abs: 11.3 10*3/uL — ABNORMAL HIGH (ref 1.7–7.7)
Platelets: 340 10*3/uL (ref 150–400)
RBC: 4.03 MIL/uL (ref 3.87–5.11)
RDW: 13.2 % (ref 11.5–15.5)
WBC: 12.4 10*3/uL — ABNORMAL HIGH (ref 4.0–10.5)

## 2014-12-20 LAB — GLUCOSE, CAPILLARY
Glucose-Capillary: 179 mg/dL — ABNORMAL HIGH (ref 65–99)
Glucose-Capillary: 249 mg/dL — ABNORMAL HIGH (ref 65–99)

## 2014-12-20 LAB — BASIC METABOLIC PANEL
Anion gap: 10 (ref 5–15)
BUN: 15 mg/dL (ref 6–20)
CALCIUM: 9.4 mg/dL (ref 8.9–10.3)
CO2: 32 mmol/L (ref 22–32)
CREATININE: 0.48 mg/dL (ref 0.44–1.00)
Chloride: 98 mmol/L — ABNORMAL LOW (ref 101–111)
Glucose, Bld: 176 mg/dL — ABNORMAL HIGH (ref 65–99)
Potassium: 3.8 mmol/L (ref 3.5–5.1)
SODIUM: 140 mmol/L (ref 135–145)

## 2014-12-20 LAB — I-STAT TROPONIN, ED: Troponin i, poc: 0.06 ng/mL (ref 0.00–0.08)

## 2014-12-20 LAB — BLOOD GAS, ARTERIAL
ACID-BASE EXCESS: 8.2 mmol/L — AB (ref 0.0–2.0)
BICARBONATE: 33.8 meq/L — AB (ref 20.0–24.0)
Drawn by: 362771
O2 CONTENT: 3 L/min
O2 SAT: 94.9 %
PATIENT TEMPERATURE: 98.6
PO2 ART: 79.8 mmHg — AB (ref 80.0–100.0)
TCO2: 35.7 mmol/L (ref 0–100)
pCO2 arterial: 62.6 mmHg (ref 35.0–45.0)
pH, Arterial: 7.352 (ref 7.350–7.450)

## 2014-12-20 LAB — PROTIME-INR
INR: 1.05 (ref 0.00–1.49)
PROTHROMBIN TIME: 13.9 s (ref 11.6–15.2)

## 2014-12-20 LAB — I-STAT VENOUS BLOOD GAS, ED
ACID-BASE EXCESS: 7 mmol/L — AB (ref 0.0–2.0)
BICARBONATE: 35.2 meq/L — AB (ref 20.0–24.0)
O2 SAT: 84 %
PH VEN: 7.317 — AB (ref 7.250–7.300)
PO2 VEN: 55 mmHg — AB (ref 30.0–45.0)
TCO2: 37 mmol/L (ref 0–100)
pCO2, Ven: 68.7 mmHg — ABNORMAL HIGH (ref 45.0–50.0)

## 2014-12-20 LAB — CBG MONITORING, ED
GLUCOSE-CAPILLARY: 180 mg/dL — AB (ref 65–99)
GLUCOSE-CAPILLARY: 175 mg/dL — AB (ref 65–99)

## 2014-12-20 LAB — APTT: APTT: 24 s (ref 24–37)

## 2014-12-20 MED ORDER — INSULIN ASPART 100 UNIT/ML ~~LOC~~ SOLN: 0.0000 [IU] | Freq: Three times a day (TID) | SUBCUTANEOUS | Status: DC

## 2014-12-20 MED ORDER — PRAVASTATIN SODIUM 40 MG PO TABS
80.0000 mg | ORAL_TABLET | Freq: Every evening | ORAL | Status: DC
  Administered 2014-12-20 – 2014-12-30 (×9): 80 mg via ORAL
  Filled 2014-12-20 (×15): qty 2

## 2014-12-20 MED ORDER — ONDANSETRON HCL 4 MG/2ML IJ SOLN
4.0000 mg | Freq: Four times a day (QID) | INTRAMUSCULAR | Status: DC | PRN
  Administered 2014-12-21 – 2014-12-30 (×6): 4 mg via INTRAVENOUS
  Filled 2014-12-20 (×6): qty 2

## 2014-12-20 MED ORDER — CLOPIDOGREL BISULFATE 75 MG PO TABS
75.0000 mg | ORAL_TABLET | Freq: Every morning | ORAL | Status: DC
  Administered 2014-12-21 – 2014-12-31 (×11): 75 mg via ORAL
  Filled 2014-12-20 (×11): qty 1

## 2014-12-20 MED ORDER — BENZONATATE 100 MG PO CAPS
100.0000 mg | ORAL_CAPSULE | Freq: Three times a day (TID) | ORAL | Status: DC | PRN
  Administered 2014-12-22 – 2014-12-31 (×10): 100 mg via ORAL
  Filled 2014-12-20 (×10): qty 1

## 2014-12-20 MED ORDER — ENOXAPARIN SODIUM 40 MG/0.4ML ~~LOC~~ SOLN
40.0000 mg | SUBCUTANEOUS | Status: DC
  Filled 2014-12-20: qty 0.4

## 2014-12-20 MED ORDER — MONTELUKAST SODIUM 10 MG PO TABS
10.0000 mg | ORAL_TABLET | Freq: Every day | ORAL | Status: DC
  Administered 2014-12-20 – 2014-12-26 (×6): 10 mg via ORAL
  Filled 2014-12-20 (×6): qty 1

## 2014-12-20 MED ORDER — LEVOFLOXACIN IN D5W 750 MG/150ML IV SOLN
750.0000 mg | INTRAVENOUS | Status: AC
  Administered 2014-12-20 – 2014-12-24 (×3): 750 mg via INTRAVENOUS
  Filled 2014-12-20 (×3): qty 150

## 2014-12-20 MED ORDER — ALPRAZOLAM 0.25 MG PO TABS
0.2500 mg | ORAL_TABLET | Freq: Two times a day (BID) | ORAL | Status: DC
  Administered 2014-12-20 – 2014-12-21 (×2): 0.25 mg via ORAL
  Filled 2014-12-20 (×2): qty 1

## 2014-12-20 MED ORDER — INSULIN ASPART 100 UNIT/ML ~~LOC~~ SOLN
0.0000 [IU] | Freq: Three times a day (TID) | SUBCUTANEOUS | Status: DC
  Administered 2014-12-20: 4 [IU] via SUBCUTANEOUS

## 2014-12-20 MED ORDER — ONDANSETRON HCL 4 MG/2ML IJ SOLN
4.0000 mg | Freq: Once | INTRAMUSCULAR | Status: AC
  Administered 2014-12-20: 4 mg via INTRAVENOUS
  Filled 2014-12-20: qty 2

## 2014-12-20 MED ORDER — BUDESONIDE-FORMOTEROL FUMARATE 160-4.5 MCG/ACT IN AERO
2.0000 | INHALATION_SPRAY | Freq: Two times a day (BID) | RESPIRATORY_TRACT | Status: DC
  Administered 2014-12-21 (×2): 2 via RESPIRATORY_TRACT
  Filled 2014-12-20: qty 6

## 2014-12-20 MED ORDER — BUDESONIDE-FORMOTEROL FUMARATE 160-4.5 MCG/ACT IN AERO: 2.0000 | INHALATION_SPRAY | Freq: Two times a day (BID) | RESPIRATORY_TRACT | Status: DC

## 2014-12-20 MED ORDER — ACETAMINOPHEN 650 MG RE SUPP: 650.0000 mg | Freq: Four times a day (QID) | RECTAL | Status: DC | PRN

## 2014-12-20 MED ORDER — ONDANSETRON HCL 4 MG PO TABS
4.0000 mg | ORAL_TABLET | Freq: Four times a day (QID) | ORAL | Status: DC | PRN
  Administered 2014-12-26 – 2014-12-31 (×3): 4 mg via ORAL
  Filled 2014-12-20 (×3): qty 1

## 2014-12-20 MED ORDER — TIOTROPIUM BROMIDE MONOHYDRATE 18 MCG IN CAPS
1.0000 | ORAL_CAPSULE | Freq: Every day | RESPIRATORY_TRACT | Status: DC
  Filled 2014-12-20: qty 5

## 2014-12-20 MED ORDER — IPRATROPIUM-ALBUTEROL 0.5-2.5 (3) MG/3ML IN SOLN
3.0000 mL | RESPIRATORY_TRACT | Status: DC
  Administered 2014-12-20 – 2014-12-22 (×12): 3 mL via RESPIRATORY_TRACT
  Filled 2014-12-20 (×12): qty 3

## 2014-12-20 MED ORDER — GUAIFENESIN ER 600 MG PO TB12
600.0000 mg | ORAL_TABLET | Freq: Every day | ORAL | Status: DC
  Administered 2014-12-20 – 2014-12-22 (×3): 600 mg via ORAL
  Filled 2014-12-20 (×3): qty 1

## 2014-12-20 MED ORDER — ALBUTEROL SULFATE (2.5 MG/3ML) 0.083% IN NEBU
5.0000 mg | INHALATION_SOLUTION | Freq: Once | RESPIRATORY_TRACT | Status: AC
  Administered 2014-12-20: 5 mg via RESPIRATORY_TRACT
  Filled 2014-12-20: qty 6

## 2014-12-20 MED ORDER — METHYLPREDNISOLONE SODIUM SUCC 125 MG IJ SOLR
125.0000 mg | Freq: Once | INTRAMUSCULAR | Status: AC
  Administered 2014-12-20: 125 mg via INTRAVENOUS
  Filled 2014-12-20: qty 2

## 2014-12-20 MED ORDER — INSULIN ASPART 100 UNIT/ML ~~LOC~~ SOLN: 0.0000 [IU] | Freq: Every day | SUBCUTANEOUS | Status: DC

## 2014-12-20 MED ORDER — NITROGLYCERIN 0.4 MG SL SUBL: 0.4000 mg | SUBLINGUAL_TABLET | SUBLINGUAL | Status: DC | PRN

## 2014-12-20 MED ORDER — IPRATROPIUM-ALBUTEROL 0.5-2.5 (3) MG/3ML IN SOLN
3.0000 mL | RESPIRATORY_TRACT | Status: DC | PRN
  Administered 2014-12-20 – 2014-12-25 (×7): 3 mL via RESPIRATORY_TRACT
  Filled 2014-12-20 (×6): qty 3

## 2014-12-20 MED ORDER — CETYLPYRIDINIUM CHLORIDE 0.05 % MT LIQD
7.0000 mL | Freq: Two times a day (BID) | OROMUCOSAL | Status: DC
  Administered 2014-12-20 – 2014-12-30 (×17): 7 mL via OROMUCOSAL

## 2014-12-20 MED ORDER — ACETAMINOPHEN 325 MG PO TABS
650.0000 mg | ORAL_TABLET | Freq: Four times a day (QID) | ORAL | Status: DC | PRN
  Filled 2014-12-20: qty 2

## 2014-12-20 NOTE — H&P (Signed)
[facility] History and Physical  KHADIJA BUDZYNSKI [id number] DOB: 03/11/1945 DOA: 12/20/2014  Referring physician: Emergency Department PCP: Jenny Reichmann, MD   CHIEF COMPLAINT:  cough, shortness of breath.    HPI: Judith Boyer is a 69 y.o. female  with COPD. Patient was recently hospitalized with acute on chronic respiratory failure. Her breathing was slow to improve and given advanced disease Palliative Medicine was consulted. Palliative care team attempted to discuss CODE STATUS with the patient on multiple occasions but she apparently refused. Patient has been on a tapering dose of steroids since discharge in August, currently taking 20mg  of prednisone daily. She is worried about blood sugars being high.   Patient has been becoming increasingly short of breath at home over the last week. Pulmonary gave her an office appointment for Thursday but patient felt she could not wait. Patient has been coughing up excessive amount of clear mucus. She feels her chest is very congested despite twice-daily Mucinex. Hears herself wheezing. No fevers. She was unable to use inhaler at home today due to significant shortness of breath. No complaints of peripheral edema.   ED COURSE:   In process of third nebulizer since arrival to ED  Labs: ABGs-pH 7.31, PCO2 60 8.7, PO2 55, bicarbonate 30 5.2   Glucose 176, bmet otherwise unremarkable. Troponin normal 0.06. Wbc 12 point 4, hemoglobin 11.5.   CXR:   No active disease  EKG:    Sinus tachycardia Right axis deviation Consider left ventricular hypertrophy Confirmed by Hazle Coca [phone]) on 11/  Medications  methylPREDNISolone sodium succinate (SOLU-MEDROL) 125 mg/2 mL injection 125 mg (125 mg Intravenous Given 12/20/14 1315)  ondansetron (ZOFRAN) injection 4 mg (4 mg Intravenous Given 12/20/14 1414)  albuterol (PROVENTIL) (2.5 MG/3ML) 0.083% nebulizer solution 5 mg (5 mg Nebulization Given 12/20/14 1528)    Review of Systems    Constitutional: Negative.   HENT: Positive for congestion.   Eyes: Negative.   Respiratory: Positive for cough, shortness of breath and wheezing.   Cardiovascular: Negative.   Gastrointestinal: Negative.   Genitourinary: Negative.   Musculoskeletal: Negative.   Skin: Negative.   Neurological: Negative.   Endo/Heme/Allergies: Negative.   Psychiatric/Behavioral: Negative.    Past Medical History  Diagnosis Date  . COPD (chronic obstructive pulmonary disease) ([place])   . Hypertension   . Coronary artery disease   . Shortness of breath   . High cholesterol   . Myocardial infarction ([place]) 02/2011  . On home oxygen therapy     "2L; 24h/day" (02/08/2014)  . Pneumonia     "couple times" (02/08/2014)  . Type II diabetes mellitus ([place])     type 2  . Anemia   . History of blood transfusion 2014    "extremely anemic"   Past Surgical History  Procedure Laterality Date  . Shoulder arthroscopy w/ rotator cuff repair Left   . Left heart catheterization with coronary angiogram N/A 02/09/2011    Procedure: LEFT HEART CATHETERIZATION WITH CORONARY ANGIOGRAM;  Surgeon: Birdie Riddle, MD;  Location: [facility];  Service: Cardiovascular;  Laterality: N/A;  . Tonsillectomy    . Abdominal hysterectomy  1970's  . Coronary angioplasty with stent placement  12/2007    "2"  . Cardiac catheterization  02/2011    SOCIAL HISTORY:  reports that she quit smoking about 23 months ago. Her smoking use included Cigarettes. She has a 50 pack-year smoking history. She has never used smokeless tobacco. She reports that she does not drink alcohol  or use illicit drugs. Lives:  At home    With:  son    Assistive devices:   None needed for ambulation.   Allergies  Allergen Reactions  . Doxycycline Anaphylaxis  . Codeine Other (See Comments)    hyperactivity  . Dulera [Mometasone Furo-Formoterol Fum] Hives and Rash  . Spiriva Handihaler [Tiotropium Bromide Monohydrate] Rash and Other (See Comments)    Rash and  Urinary Retention  . Adhesive [Tape] Rash  . Latex Other (See Comments)    unknown  . Lisinopril Cough    Family History  Problem Relation Age of Onset  . Heart disease Maternal Grandmother   . Brain cancer Brother   . Lung cancer Mother     smoked    Prior to Admission medications   Medication Sig Start Date End Date Taking? Authorizing Provider  albuterol (PROAIR HFA) 108 (90 BASE) MCG/ACT inhaler Inhale 2 puffs into the lungs every 4 (four) hours as needed for wheezing or shortness of breath. 08/17/14   [name], MD  albuterol (PROVENTIL) (2.5 MG/3ML) 0.083% nebulizer solution Take 3 mLs (2.5 mg total) by nebulization every 4 (four) hours as needed for wheezing or shortness of breath. 12/17/14   Tanda Rockers, MD  ALPRAZolam Duanne Moron) 0.25 MG tablet Take 1 tablet (0.25 mg total) by mouth 2 (two) times daily. 09/21/14   [name], MD  BD PEN NEEDLE NANO U/F 32G X 4 MM MISC as directed 09/22/14   [name], MD  budesonide-formoterol [facility]) 160-4.5 MCG/ACT inhaler Take 2 puffs first thing in am and then another 2 puffs about 12 hours later. 10/28/14   Tanda Rockers, MD  clopidogrel (PLAVIX) 75 MG tablet Take 1 tablet (75 mg total) by mouth every morning. 06/28/13   [name], MD  glucose blood (ACCU-CHEK AVIVA) test strip Check sugar in the morning and before supper 09/21/14   [name], MD  guaiFENesin (MUCINEX) 600 MG 12 hr tablet Take 600 mg by mouth daily. Take every day per patient    Historical Provider, MD  insulin NPH Human (NOVOLIN N) 100 UNIT/ML injection Inject 14 Units into the skin daily.     Historical Provider, MD  Iron TABS Take 1 tablet by mouth daily.    Historical Provider, MD  metFORMIN (GLUCOPHAGE) 500 MG tablet Take 2 tablets (1,000 mg total) by mouth 2 (two) times daily with a meal. 09/14/14   Verner Mould, MD  montelukast (SINGULAIR) 10 MG tablet take 1 tablet by mouth once daily Patient taking differently: take 1 tablet by mouth once  every evening 07/09/14   [name], MD  nitroGLYCERIN (NITROSTAT) 0.4 MG SL tablet Place 1 tablet (0.4 mg total) under the tongue every 5 (five) minutes x 3 doses as needed for chest pain. 02/07/12   Dixie Dials, MD  pravastatin (PRAVACHOL) 80 MG tablet Take 1 tablet (80 mg total) by mouth every evening. 06/28/13   [name], MD  predniSONE (DELTASONE) 10 MG tablet Take  2 each am until better then 1 each am 12/17/14   Tanda Rockers, MD  Tiotropium Bromide Monohydrate (SPIRIVA RESPIMAT) 2.5 MCG/ACT AERS 2 puffs each am 12/03/14   Tanda Rockers, MD  valsartan (DIOVAN) 80 MG tablet Take 1 tablet (80 mg total) by mouth daily. Patient taking differently: Take 80 mg by mouth every evening.  02/22/14   Joretta Bachelor, PA   PHYSICAL EXAM: Filed Vitals:   12/20/14 1320  12/20/14 1345 12/20/14 1415 12/20/14 1500  BP: 153/66 165/69 121/58 127/54  Pulse: 124 127 118 113  Temp:      TempSrc:      Resp: 24 29 24  34  Height:      Weight:      SpO2: 100% 94% 95% 98%    Wt Readings from Last 3 Encounters:  12/20/14 43.999 kg (97 lb)  12/03/14 45.178 kg (99 lb 9.6 oz)  11/18/14 43.999 kg (97 lb)    General:  Pleasant white female. Labored breathing.  Eyes: PER, normal lids, irises & conjunctiva ENT: grossly normal hearing, lips & tongue Neck: no LAD, no masses Cardiovascular: tachycardic.  No LE edema.  Respiratory: Respirations even and unlabored. Normal respiratory effort. Lungs CTA bilaterally, no wheezes / rales .   Abdomen: soft, non-distended, non-tender, active bowel sounds. No obvious masses.  Skin: no rash seen on limited exam Musculoskeletal: grossly normal tone BUE/BLE Psychiatric: grossly normal mood and affect, speech fluent and appropriate Neurologic: grossly non-focal.         LABS ON ADMISSION:    Basic Metabolic Panel:  Recent Labs Lab 12/20/14 1350  NA 140  K 3.8  CL 98*  CO2 32  GLUCOSE 176*  BUN 15  CREATININE 0.48  CALCIUM 9.4   CBC:  Recent  Labs Lab 12/20/14 1350  WBC 12.4*  NEUTROABS 11.3*  HGB 11.5*  HCT 37.6  MCV 93.3  PLT 340    BNP (last 3 results)  Recent Labs  02/11/14 1655 06/18/14 1740 08/13/14 1320  BNP 107.0* 12.2 12.0   CBG:  Recent Labs Lab 12/20/14 1354  GLUCAP 180*    CREATININE: 0.48 (12/20/14 1350) Estimated creatinine clearance - 45.5 mL/min  Radiological Exams on Admission: Dg Chest Port 1 View  12/20/2014  CLINICAL DATA:  Shortness of breath and cough for 3 days EXAM: PORTABLE CHEST 1 VIEW COMPARISON:  09/09/2014 FINDINGS: The heart size and mediastinal contours are within normal limits. Both lungs are clear. The visualized skeletal structures are unremarkable. IMPRESSION: No active disease. Electronically Signed   By: Kerby Moors M.D.   On: 12/20/2014 13:21     ASSESSMENT / PLAN    COPD exacerbation ([place]). -admit to Observation- Medical Bed -Continue nebulizers -IV Solu-Medrol -Continue Mucinex twice daily -Continue home Singular, Spiriva -Pulmonary Toilet -Continue Oxygen therapy.at 3L  -Continue Xanax twice daily -helps breathing  Uncontrolled diabetes mellitus type 2 without complications -Monitor CBGs closely on Solu-Medrol -hold home Metformin -Place on sliding scale insulin -Carb modified diet  CAD, s/p stenting 2009. On chronic Plavix. Echocardiogram January 2015. LDL ejection fraction 55-60%  CONSULTANTS:   none  Code Status: full code DVT Prophylaxis: Lovenox.  Family Communication:  Patient alert, oriented and understands plan of care.    Disposition Plan: Discharge to home in 24-48 hours   Time spent: 60 minutes Tye Savoy  NP [facility] Pager [phone]

## 2014-12-20 NOTE — Progress Notes (Signed)
Patient arrived from ED alert and oriented x4. Short of breath and stating "I can not breathe". Chester Holstein, NP paged to make aware. Awaiting orders.    6:47 PM Chester Holstein, NP telephone order to place Q2 PRN duoneb order. Respiratory Therapy made aware.

## 2014-12-20 NOTE — ED Notes (Signed)
Pt presents from home via [facility] with complaints of shortness of breath that started a week ago, but has gotten progressively worse. Pt reports having a productive cough,coughin up yellow/white sputum but today a nonproductive cough. Pt is A/O. Pt denies any fever/chills, but reports nausea. Pt has had two duoneb treatments for EMS. EMS gave pt 4 mg of Zofran.

## 2014-12-20 NOTE — ED Notes (Signed)
Family at bedside. 

## 2014-12-20 NOTE — Progress Notes (Signed)
Report received for admission to [phone]

## 2014-12-20 NOTE — Telephone Encounter (Signed)
Called and spoke with pt. She stated that breathing had not improved but worsened. Pt stated she is unable to drive to the office for an ov today. She said that if she did not improve she would have to call an ambulance. I explained to her if she was unable to make ov today and symptoms worsened to seek emergency care. I offered ov on 12/22/14 at 11:45 but pt refused stating she needed an ov after 2pm. OV was scheduled with TP on 12/23/14 at 2:45. Patient voiced understanding and had no further questions.

## 2014-12-20 NOTE — Progress Notes (Signed)
ANTIBIOTIC CONSULT NOTE - INITIAL  Pharmacy Consult for Levaquin Indication: COPD exacerbation  Allergies  Allergen Reactions  . Doxycycline Anaphylaxis  . Codeine Other (See Comments)    hyperactivity  . Dulera [Mometasone Furo-Formoterol Fum] Hives and Rash  . Adhesive [Tape] Rash  . Latex Other (See Comments)    unknown  . Lisinopril Cough    Patient Measurements: Height: 5\' 1"  (154.9 cm) Weight: 97 lb (43.999 kg) IBW/kg (Calculated) : 47.8  Vital Signs: Temp: 98.3 F (36.8 C) (11/14 1301) Temp Source: Axillary (11/14 1301) BP: 131/63 mmHg (11/14 1745) Pulse Rate: 111 (11/14 1745) Intake/Output from previous day:   Intake/Output from this shift:    Labs:  Recent Labs  12/20/14 1350  WBC 12.4*  HGB 11.5*  PLT 340  CREATININE 0.48   Estimated Creatinine Clearance: 45.5 mL/min (by C-G formula based on Cr of 0.48). No results for input(s): VANCOTROUGH, VANCOPEAK, VANCORANDOM, GENTTROUGH, GENTPEAK, GENTRANDOM, TOBRATROUGH, TOBRAPEAK, TOBRARND, AMIKACINPEAK, AMIKACINTROU, AMIKACIN in the last 72 hours.   Microbiology: No results found for this or any previous visit (from the past 720 hour(s)).  Medical History: Past Medical History  Diagnosis Date  . COPD (chronic obstructive pulmonary disease) ([place])   . Hypertension   . Coronary artery disease   . Shortness of breath   . High cholesterol   . Myocardial infarction ([place]) 02/2011  . On home oxygen therapy     "2L; 24h/day" (02/08/2014)  . Pneumonia     "couple times" (02/08/2014)  . Type II diabetes mellitus ([place])     type 2  . Anemia   . History of blood transfusion 2014    "extremely anemic"   Assessment: 69 yo f presenting to the ED on 11/14 with SOB.  Pharmacy is consulted to dose Levaquin for COPD exacerbation.  Wbc 12.4, afebrile, SCr 0.48, CrCl ~ 45 ml/min.  Levaquin 11/14 >>  Goal of Therapy:  Resolution of symptoms/infection  Plan:  Levaquin 750 mg IV q48h Patient only needs ~5 days of  treatment, consider d/c'ing on 11/18 Monitor renal fx, cx, CBC, clinical course  Cassie L. Nicole Kindred, PharmD PGY2 Infectious Diseases Pharmacy Resident Pager: [phone] 12/20/2014 6:23 PM

## 2014-12-20 NOTE — ED Notes (Signed)
Increased oxygent to 3L and she feels a little better post treatment.  Pt was assisted to bedside commode. Restarted IV d/t pain in right wrist iv with movement.  Labs sent.  EDP to bedside re-assessing patient.

## 2014-12-20 NOTE — Telephone Encounter (Signed)
  Verbal order for skilled nursing.    [phone]

## 2014-12-20 NOTE — ED Provider Notes (Signed)
CSN: [id number]     Arrival date & time 12/20/14  1257 History   First MD Initiated Contact with Patient 12/20/14 1304     Chief Complaint  Patient presents with  . Shortness of Breath     The history is provided by the patient. No language interpreter was used.   Judith Boyer is a 69 year old woman with history of COPD on home oxygen that presents for evaluation of shortness of breath. She reports 1 week of increased shortness of breath and cough. She reports a large amount of mucus production that is clear, white, yellow. She called her PCP several days ago and was told to continue her prednisone and stomach medicines. She denies any fevers, chest pain, leg swelling. She states that her sugars have been very high and she is having  excessive urination. Prior to ED arrival she received 2 DuoNeb reports minimal improvement in her symptoms. She could not tolerate her home nebulizer treatments because she could not breathe that well through her mouth.  Past Medical History  Diagnosis Date  . COPD (chronic obstructive pulmonary disease) ([place])   . Hypertension   . Coronary artery disease   . Shortness of breath   . High cholesterol   . Myocardial infarction ([place]) 02/2011  . On home oxygen therapy     "2L; 24h/day" (02/08/2014)  . Pneumonia     "couple times" (02/08/2014)  . Type II diabetes mellitus ([place])     type 2  . Anemia   . History of blood transfusion 2014    "extremely anemic"   Past Surgical History  Procedure Laterality Date  . Shoulder arthroscopy w/ rotator cuff repair Left   . Left heart catheterization with coronary angiogram N/A 02/09/2011    Procedure: LEFT HEART CATHETERIZATION WITH CORONARY ANGIOGRAM;  Surgeon: Birdie Riddle, MD;  Location: [facility] CATH [facility];  Service: Cardiovascular;  Laterality: N/A;  . Tonsillectomy    . Abdominal hysterectomy  1970's  . Coronary angioplasty with stent placement  12/2007    "2"  . Cardiac catheterization  02/2011   Family History   Problem Relation Age of Onset  . Heart disease Maternal Grandmother   . Brain cancer Brother   . Lung cancer Mother     smoked   Social History  Substance Use Topics  . Smoking status: Former Smoker -- 1.00 packs/day for 50 years    Types: Cigarettes    Quit date: 01/10/2013  . Smokeless tobacco: Never Used  . Alcohol Use: No   OB History    No data available     Review of Systems  All other systems reviewed and are negative.     Allergies  Doxycycline; Codeine; Dulera; Spiriva handihaler; Adhesive; Latex; and Lisinopril  Home Medications   Prior to Admission medications   Medication Sig Start Date End Date Taking? Authorizing Provider  albuterol (PROAIR HFA) 108 (90 BASE) MCG/ACT inhaler Inhale 2 puffs into the lungs every 4 (four) hours as needed for wheezing or shortness of breath. 08/17/14   [name], MD  albuterol (PROVENTIL) (2.5 MG/3ML) 0.083% nebulizer solution Take 3 mLs (2.5 mg total) by nebulization every 4 (four) hours as needed for wheezing or shortness of breath. 12/17/14   Tanda Rockers, MD  ALPRAZolam Duanne Moron) 0.25 MG tablet Take 1 tablet (0.25 mg total) by mouth 2 (two) times daily. 09/21/14   [name], MD  BD PEN NEEDLE NANO U/F 32G X 4 MM MISC as directed 09/22/14  [name], MD  budesonide-formoterol [facility]) 160-4.5 MCG/ACT inhaler Take 2 puffs first thing in am and then another 2 puffs about 12 hours later. 10/28/14   Tanda Rockers, MD  clopidogrel (PLAVIX) 75 MG tablet Take 1 tablet (75 mg total) by mouth every morning. 06/28/13   [name], MD  glucose blood (ACCU-CHEK AVIVA) test strip Check sugar in the morning and before supper 09/21/14   [name], MD  guaiFENesin (MUCINEX) 600 MG 12 hr tablet Take 600 mg by mouth daily. Take every day per patient    Historical Provider, MD  insulin NPH Human (NOVOLIN N) 100 UNIT/ML injection Inject 14 Units into the skin daily.     Historical Provider, MD  Iron TABS Take 1 tablet by mouth  daily.    Historical Provider, MD  metFORMIN (GLUCOPHAGE) 500 MG tablet Take 2 tablets (1,000 mg total) by mouth 2 (two) times daily with a meal. 09/14/14   Verner Mould, MD  montelukast (SINGULAIR) 10 MG tablet take 1 tablet by mouth once daily Patient taking differently: take 1 tablet by mouth once every evening 07/09/14   [name], MD  nitroGLYCERIN (NITROSTAT) 0.4 MG SL tablet Place 1 tablet (0.4 mg total) under the tongue every 5 (five) minutes x 3 doses as needed for chest pain. 02/07/12   Dixie Dials, MD  pravastatin (PRAVACHOL) 80 MG tablet Take 1 tablet (80 mg total) by mouth every evening. 06/28/13   [name], MD  predniSONE (DELTASONE) 10 MG tablet Take  2 each am until better then 1 each am 12/17/14   Tanda Rockers, MD  Tiotropium Bromide Monohydrate (SPIRIVA RESPIMAT) 2.5 MCG/ACT AERS 2 puffs each am 12/03/14   Tanda Rockers, MD  valsartan (DIOVAN) 80 MG tablet Take 1 tablet (80 mg total) by mouth daily. Patient taking differently: Take 80 mg by mouth every evening.  02/22/14   Stephanie D English, PA   BP 171/82 mmHg  Pulse 124  Temp(Src) 98.3 F (36.8 C) (Axillary)  Resp 28  Ht 5\' 1"  (1.549 m)  Wt 97 lb (43.999 kg)  BMI 18.34 kg/m2  SpO2 100% Physical Exam  Constitutional: She is oriented to person, place, and time. She appears well-developed and well-nourished. She appears distressed.  HENT:  Head: Normocephalic and atraumatic.  Cardiovascular: Normal rate and regular rhythm.   No murmur heard. Pulmonary/Chest: She is in respiratory distress.  Decreased air movement bilaterally with end expiratory wheezes bilaterally. Speaks in short phrases. Accessory muscle use.  Abdominal: Soft. There is no tenderness. There is no rebound and no guarding.  Musculoskeletal: She exhibits no edema or tenderness.  Neurological: She is alert and oriented to person, place, and time.  Skin: Skin is warm and dry.  Psychiatric: She has a normal mood and affect. Her behavior  is normal.  Nursing note and vitals reviewed.   ED Course  Procedures (including critical care time) Labs Review Labs Reviewed  BASIC METABOLIC PANEL - Abnormal; Notable for the following:    Chloride 98 (*)    Glucose, Bld 176 (*)    All other components within normal limits  CBC WITH DIFFERENTIAL/PLATELET - Abnormal; Notable for the following:    WBC 12.4 (*)    Hemoglobin 11.5 (*)    Neutro Abs 11.3 (*)    All other components within normal limits  I-STAT VENOUS BLOOD GAS, ED - Abnormal; Notable for the following:    pH, Ven 7.317 (*)  pCO2, Ven 68.7 (*)    pO2, Ven 55.0 (*)    Bicarbonate 35.2 (*)    Acid-Base Excess 7.0 (*)    All other components within normal limits  CBG MONITORING, ED - Abnormal; Notable for the following:    Glucose-Capillary 180 (*)    All other components within normal limits  CBG MONITORING, ED - Abnormal; Notable for the following:    Glucose-Capillary 175 (*)    All other components within normal limits  I-STAT TROPOININ, ED    Imaging Review Dg Chest Port 1 View  12/20/2014  CLINICAL DATA:  Shortness of breath and cough for 3 days EXAM: PORTABLE CHEST 1 VIEW COMPARISON:  09/09/2014 FINDINGS: The heart size and mediastinal contours are within normal limits. Both lungs are clear. The visualized skeletal structures are unremarkable. IMPRESSION: No active disease. Electronically Signed   By: Kerby Moors M.D.   On: 12/20/2014 13:21   I have personally reviewed and evaluated these images and lab results as part of my medical decision-making.   EKG Interpretation None      MDM   Final diagnoses:  Acute exacerbation of chronic obstructive pulmonary disease (COPD) ([place])    Patient with history of COPD here for increasing shortness of breath. Presentation is consistent with acute COPD exacerbation. On repeat evaluation following albuterol treatment patient with improvement in her work of breathing, now speaks in full sentences. She  continues to have and expiratory wheezes bilaterally. Discussed with hospitalist regarding admission for further treatment.  Quintella Reichert, MD 12/20/14 1729

## 2014-12-21 DIAGNOSIS — Z91128 Patient's intentional underdosing of medication regimen for other reason: Secondary | ICD-10-CM | POA: Diagnosis not present

## 2014-12-21 DIAGNOSIS — G4700 Insomnia, unspecified: Secondary | ICD-10-CM | POA: Diagnosis present

## 2014-12-21 DIAGNOSIS — J9621 Acute and chronic respiratory failure with hypoxia: Secondary | ICD-10-CM | POA: Diagnosis present

## 2014-12-21 DIAGNOSIS — Z7952 Long term (current) use of systemic steroids: Secondary | ICD-10-CM | POA: Diagnosis not present

## 2014-12-21 DIAGNOSIS — I2510 Atherosclerotic heart disease of native coronary artery without angina pectoris: Secondary | ICD-10-CM

## 2014-12-21 DIAGNOSIS — K5900 Constipation, unspecified: Secondary | ICD-10-CM | POA: Diagnosis present

## 2014-12-21 DIAGNOSIS — R0600 Dyspnea, unspecified: Secondary | ICD-10-CM | POA: Diagnosis not present

## 2014-12-21 DIAGNOSIS — Z9119 Patient's noncompliance with other medical treatment and regimen: Secondary | ICD-10-CM | POA: Diagnosis not present

## 2014-12-21 DIAGNOSIS — M6281 Muscle weakness (generalized): Secondary | ICD-10-CM | POA: Diagnosis not present

## 2014-12-21 DIAGNOSIS — J9611 Chronic respiratory failure with hypoxia: Secondary | ICD-10-CM | POA: Diagnosis not present

## 2014-12-21 DIAGNOSIS — D649 Anemia, unspecified: Secondary | ICD-10-CM | POA: Diagnosis present

## 2014-12-21 DIAGNOSIS — R41841 Cognitive communication deficit: Secondary | ICD-10-CM | POA: Diagnosis not present

## 2014-12-21 DIAGNOSIS — R2681 Unsteadiness on feet: Secondary | ICD-10-CM | POA: Diagnosis not present

## 2014-12-21 DIAGNOSIS — Z955 Presence of coronary angioplasty implant and graft: Secondary | ICD-10-CM | POA: Diagnosis not present

## 2014-12-21 DIAGNOSIS — T443X6A Underdosing of other parasympatholytics [anticholinergics and antimuscarinics] and spasmolytics, initial encounter: Secondary | ICD-10-CM | POA: Diagnosis present

## 2014-12-21 DIAGNOSIS — Z91040 Latex allergy status: Secondary | ICD-10-CM | POA: Diagnosis not present

## 2014-12-21 DIAGNOSIS — J449 Chronic obstructive pulmonary disease, unspecified: Secondary | ICD-10-CM | POA: Diagnosis not present

## 2014-12-21 DIAGNOSIS — Z91048 Other nonmedicinal substance allergy status: Secondary | ICD-10-CM | POA: Diagnosis not present

## 2014-12-21 DIAGNOSIS — Z9114 Patient's other noncompliance with medication regimen: Secondary | ICD-10-CM | POA: Diagnosis not present

## 2014-12-21 DIAGNOSIS — J9612 Chronic respiratory failure with hypercapnia: Secondary | ICD-10-CM | POA: Diagnosis not present

## 2014-12-21 DIAGNOSIS — F17210 Nicotine dependence, cigarettes, uncomplicated: Secondary | ICD-10-CM | POA: Diagnosis present

## 2014-12-21 DIAGNOSIS — Z7189 Other specified counseling: Secondary | ICD-10-CM | POA: Diagnosis not present

## 2014-12-21 DIAGNOSIS — Z888 Allergy status to other drugs, medicaments and biological substances status: Secondary | ICD-10-CM | POA: Diagnosis not present

## 2014-12-21 DIAGNOSIS — E7800 Pure hypercholesterolemia, unspecified: Secondary | ICD-10-CM | POA: Diagnosis present

## 2014-12-21 DIAGNOSIS — Z66 Do not resuscitate: Secondary | ICD-10-CM | POA: Diagnosis not present

## 2014-12-21 DIAGNOSIS — R0602 Shortness of breath: Secondary | ICD-10-CM | POA: Diagnosis not present

## 2014-12-21 DIAGNOSIS — Z881 Allergy status to other antibiotic agents status: Secondary | ICD-10-CM | POA: Diagnosis not present

## 2014-12-21 DIAGNOSIS — Z681 Body mass index (BMI) 19 or less, adult: Secondary | ICD-10-CM | POA: Diagnosis not present

## 2014-12-21 DIAGNOSIS — F419 Anxiety disorder, unspecified: Secondary | ICD-10-CM | POA: Diagnosis not present

## 2014-12-21 DIAGNOSIS — Z7902 Long term (current) use of antithrombotics/antiplatelets: Secondary | ICD-10-CM | POA: Diagnosis not present

## 2014-12-21 DIAGNOSIS — E875 Hyperkalemia: Secondary | ICD-10-CM | POA: Diagnosis not present

## 2014-12-21 DIAGNOSIS — E43 Unspecified severe protein-calorie malnutrition: Secondary | ICD-10-CM | POA: Diagnosis present

## 2014-12-21 DIAGNOSIS — Z794 Long term (current) use of insulin: Secondary | ICD-10-CM | POA: Diagnosis not present

## 2014-12-21 DIAGNOSIS — R278 Other lack of coordination: Secondary | ICD-10-CM | POA: Diagnosis not present

## 2014-12-21 DIAGNOSIS — I252 Old myocardial infarction: Secondary | ICD-10-CM | POA: Diagnosis not present

## 2014-12-21 DIAGNOSIS — J441 Chronic obstructive pulmonary disease with (acute) exacerbation: Secondary | ICD-10-CM | POA: Diagnosis not present

## 2014-12-21 DIAGNOSIS — I10 Essential (primary) hypertension: Secondary | ICD-10-CM | POA: Diagnosis not present

## 2014-12-21 DIAGNOSIS — E1165 Type 2 diabetes mellitus with hyperglycemia: Secondary | ICD-10-CM

## 2014-12-21 DIAGNOSIS — Z720 Tobacco use: Secondary | ICD-10-CM | POA: Diagnosis not present

## 2014-12-21 DIAGNOSIS — E118 Type 2 diabetes mellitus with unspecified complications: Secondary | ICD-10-CM | POA: Diagnosis not present

## 2014-12-21 DIAGNOSIS — J9622 Acute and chronic respiratory failure with hypercapnia: Secondary | ICD-10-CM | POA: Diagnosis not present

## 2014-12-21 DIAGNOSIS — Z9981 Dependence on supplemental oxygen: Secondary | ICD-10-CM | POA: Diagnosis not present

## 2014-12-21 DIAGNOSIS — Z7951 Long term (current) use of inhaled steroids: Secondary | ICD-10-CM | POA: Diagnosis not present

## 2014-12-21 DIAGNOSIS — Z885 Allergy status to narcotic agent status: Secondary | ICD-10-CM | POA: Diagnosis not present

## 2014-12-21 LAB — POCT I-STAT 3, ART BLOOD GAS (G3+)
ACID-BASE EXCESS: 9 mmol/L — AB (ref 0.0–2.0)
ACID-BASE EXCESS: 9 mmol/L — AB (ref 0.0–2.0)
Acid-Base Excess: 9 mmol/L — ABNORMAL HIGH (ref 0.0–2.0)
BICARBONATE: 36.7 meq/L — AB (ref 20.0–24.0)
BICARBONATE: 36.3 meq/L — AB (ref 20.0–24.0)
Bicarbonate: 36.7 mEq/L — ABNORMAL HIGH (ref 20.0–24.0)
O2 SAT: 96 %
O2 SAT: 96 %
O2 Saturation: 93 %
PCO2 ART: 67.7 mmHg — AB (ref 35.0–45.0)
PCO2 ART: 61 mmHg — AB (ref 35.0–45.0)
PH ART: 7.379 (ref 7.350–7.450)
PO2 ART: 91 mmHg (ref 80.0–100.0)
PO2 ART: 85 mmHg (ref 80.0–100.0)
Patient temperature: 98.6
Patient temperature: 98.5
TCO2: 39 mmol/L (ref 0–100)
TCO2: 39 mmol/L (ref 0–100)
TCO2: 38 mmol/L (ref 0–100)
pCO2 arterial: 62 mmHg (ref 35.0–45.0)
pH, Arterial: 7.342 — ABNORMAL LOW (ref 7.350–7.450)
pH, Arterial: 7.382 (ref 7.350–7.450)
pO2, Arterial: 71 mmHg — ABNORMAL LOW (ref 80.0–100.0)

## 2014-12-21 LAB — GLUCOSE, CAPILLARY
GLUCOSE-CAPILLARY: 170 mg/dL — AB (ref 65–99)
GLUCOSE-CAPILLARY: 170 mg/dL — AB (ref 65–99)
GLUCOSE-CAPILLARY: 117 mg/dL — AB (ref 65–99)
Glucose-Capillary: 103 mg/dL — ABNORMAL HIGH (ref 65–99)
Glucose-Capillary: 109 mg/dL — ABNORMAL HIGH (ref 65–99)
Glucose-Capillary: 128 mg/dL — ABNORMAL HIGH (ref 65–99)
Glucose-Capillary: 186 mg/dL — ABNORMAL HIGH (ref 65–99)
Glucose-Capillary: 53 mg/dL — ABNORMAL LOW (ref 65–99)

## 2014-12-21 LAB — MRSA PCR SCREENING: MRSA by PCR: NEGATIVE

## 2014-12-21 MED ORDER — ARFORMOTEROL TARTRATE 15 MCG/2ML IN NEBU
15.0000 ug | INHALATION_SOLUTION | Freq: Two times a day (BID) | RESPIRATORY_TRACT | Status: DC
  Administered 2014-12-21 – 2014-12-31 (×20): 15 ug via RESPIRATORY_TRACT
  Filled 2014-12-21 (×19): qty 2

## 2014-12-21 MED ORDER — ENOXAPARIN SODIUM 30 MG/0.3ML ~~LOC~~ SOLN
30.0000 mg | SUBCUTANEOUS | Status: DC
  Administered 2014-12-21 – 2014-12-30 (×9): 30 mg via SUBCUTANEOUS
  Filled 2014-12-21 (×9): qty 0.3

## 2014-12-21 MED ORDER — MORPHINE SULFATE (PF) 2 MG/ML IV SOLN
1.0000 mg | INTRAVENOUS | Status: DC | PRN
  Administered 2014-12-22 – 2014-12-25 (×2): 1 mg via INTRAVENOUS
  Filled 2014-12-21 (×2): qty 1

## 2014-12-21 MED ORDER — LORAZEPAM 2 MG/ML IJ SOLN: 0.5000 mg | Freq: Once | INTRAMUSCULAR | Status: DC

## 2014-12-21 MED ORDER — ALPRAZOLAM 0.25 MG PO TABS
0.2500 mg | ORAL_TABLET | Freq: Three times a day (TID) | ORAL | Status: DC
  Administered 2014-12-22 – 2014-12-26 (×12): 0.25 mg via ORAL
  Filled 2014-12-21 (×13): qty 1

## 2014-12-21 MED ORDER — METHYLPREDNISOLONE SODIUM SUCC 125 MG IJ SOLR
80.0000 mg | INTRAMUSCULAR | Status: DC
  Administered 2014-12-21: 80 mg via INTRAVENOUS
  Filled 2014-12-21: qty 2

## 2014-12-21 MED ORDER — DEXTROSE 50 % IV SOLN
INTRAVENOUS | Status: AC
  Administered 2014-12-21: 25 mL
  Filled 2014-12-21: qty 50

## 2014-12-21 MED ORDER — LORAZEPAM 2 MG/ML IJ SOLN
0.5000 mg | INTRAMUSCULAR | Status: AC | PRN
  Administered 2014-12-22 (×2): 0.5 mg via INTRAVENOUS
  Filled 2014-12-21 (×2): qty 1

## 2014-12-21 MED ORDER — DEXTROSE 5 % IV SOLN
1.0000 g | INTRAVENOUS | Status: DC
  Administered 2014-12-21: 1 g via INTRAVENOUS
  Filled 2014-12-21 (×2): qty 10

## 2014-12-21 MED ORDER — LORAZEPAM 2 MG/ML IJ SOLN
0.5000 mg | INTRAMUSCULAR | Status: AC | PRN
  Administered 2014-12-21 (×2): 0.5 mg via INTRAVENOUS
  Filled 2014-12-21 (×2): qty 1

## 2014-12-21 MED ORDER — DEXTROSE 5 % IV SOLN: 500.0000 mg | INTRAVENOUS | Status: DC

## 2014-12-21 MED ORDER — INSULIN ASPART 100 UNIT/ML ~~LOC~~ SOLN
0.0000 [IU] | SUBCUTANEOUS | Status: DC
  Administered 2014-12-21: 4 [IU] via SUBCUTANEOUS
  Administered 2014-12-22: 15 [IU] via SUBCUTANEOUS
  Administered 2014-12-22 (×2): 3 [IU] via SUBCUTANEOUS
  Administered 2014-12-23: 7 [IU] via SUBCUTANEOUS
  Administered 2014-12-23: 4 [IU] via SUBCUTANEOUS
  Administered 2014-12-23: 11 [IU] via SUBCUTANEOUS
  Administered 2014-12-23 (×2): 3 [IU] via SUBCUTANEOUS
  Administered 2014-12-24: 4 [IU] via SUBCUTANEOUS
  Administered 2014-12-24: 15 [IU] via SUBCUTANEOUS
  Administered 2014-12-24: 7 [IU] via SUBCUTANEOUS
  Administered 2014-12-24: 3 [IU] via SUBCUTANEOUS
  Administered 2014-12-24: 4 [IU] via SUBCUTANEOUS

## 2014-12-21 NOTE — Progress Notes (Signed)
RN paged this NP because pt was having increased respiratory distress and increased O2 requirement with increased WOB. Ordered ABG which showed high PCO2 and low PO2. Pt has emphysema and is on O2 at home. Looking back on previous ABGs, her PCO2 runs in the 50s and PO2 in the 130 range.  Pt says she is more SOB than usual and feels bad. Says her air hunger is worse than at home and she is working harder to breathe than she usually does at home.  Given her increased WOB and decreased PO2, will transfer pt to SDU and try Bipap. Pt informed of treatment plan and told that if bipap doesn't work or if she can not tolerate it, she may need to be intubated.  R/p ABG in 2-3 hours. With pt's permission, son Antony Madura called with update on plan of care.  Will let PCCM know pt is going on bipap. Pt is outpt of Dr. Melvyn Novas of pulmonology team. DM: q4 CBG with SSI while NPO on bipap.  Clance Boll, NP [facility]

## 2014-12-21 NOTE — Progress Notes (Signed)
Initial Nutrition Assessment  DOCUMENTATION CODES:   Not applicable  INTERVENTION:   -RD will follow for diet advancement and supplement diet as appropriate  NUTRITION DIAGNOSIS:   Inadequate oral intake related to inability to eat as evidenced by NPO status.  GOAL:   Patient will meet greater than or equal to 90% of their needs  MONITOR:   Diet advancement, Labs, Weight trends, Skin, I & O's  REASON FOR ASSESSMENT:   Consult Assessment of nutrition requirement/status  ASSESSMENT:   Judith Boyer is a 69 y.o. female with a Past Medical History of COPD, HTN, CAD, HLD, MI, DM, anemia who presents with COPD exacerbation.   Pt admitted with COPD exacerbation.   Pt was agitated, trying to get up from bed at time of visit.   Spoke with RN, who confirmed NPO status. Pt was transferred to SDU from medical floor last night due to declining respiratory status. Per RN, pt required Bi-pap overnight. This AM, RN attempted to transition to nasal cannula, however, pt was unable to maintain adequate oxygen saturations. She is currently on a non-rebreather mask.   Pt is currently receiving sips and chips. Pt tolerated juice without difficulty, per RN. No plans to diet advancement at this time. Per RN, potential for palliative care consult due to end-stage COPD.   Reviewed wt hx, which revealed wt stability over the past year.   Unable to complete Nutrition-Focused physical exam at this time. RN requested deferring exam at this time secondary to agitation and unstable respiratory status. Noted mild to moderate muscle depletion on patella and calf regions. Suspect some degree of malnutrition, due to end stage COPD and borderline underweight status, however, RD unable to confirm at this time.   Labs reviewed: CBGS: 53-170.  Diet Order:  Diet NPO time specified  Skin:  Reviewed, no issues  Last BM:  12/19/14  Height:   Ht Readings from Last 1 Encounters:  12/21/14 5\' 1"  (1.549  m)    Weight:   Wt Readings from Last 1 Encounters:  12/21/14 98 lb 8.7 oz (44.7 kg)    Ideal Body Weight:  47.7 kg  BMI:  Body mass index is 18.63 kg/(m^2).  Estimated Nutritional Needs:   Kcal:  1300-1500  Protein:  55-65 grams  Fluid:  1.3-1.5 L  EDUCATION NEEDS:   No education needs identified at this time  Zachary Nole A. Jimmye Norman, RD, LDN, CDE Pager: [phone] After hours Pager: [phone]

## 2014-12-21 NOTE — Progress Notes (Signed)
OT Cancellation Note  Patient Details Name: Judith Boyer MRN: [id number] DOB: 04-12-1945   Cancelled Treatment:    Reason Eval/Treat Not Completed: Patient not medically ready - will reattempt.   Darlina Rumpf [place], OTR/L [id number]  12/21/2014, 2:05 PM

## 2014-12-21 NOTE — Progress Notes (Signed)
Hypoglycemic Event  CBG: 53  Treatment: D50 IV 25 mL  Symptoms: None  Follow-up CBG: Time:0423 CBG Result:170  Possible Reasons for Event: Inadequate meal intake  Comments/MD notified:    Yakir Wenke K

## 2014-12-21 NOTE — Progress Notes (Signed)
PT Cancellation Note  Patient Details Name: Judith Boyer MRN: [id number] DOB: 23-Feb-1945   Cancelled Treatment:    Reason Eval/Treat Not Completed: Patient not medically ready Pt just placed back on BiPAP due to increased difficulty breathing at rest. Holding PT evaluation per request of RN until pt more stable. Will follow up next available time.   [name] A [name] 12/21/2014, 1:51 PM Wray Kearns, [place], DPT [phone]

## 2014-12-21 NOTE — Progress Notes (Signed)
Inpatient Diabetes Program Recommendations  AACE/ADA: New Consensus Statement on Inpatient Glycemic Control (2015)  Target Ranges:  Prepandial:   less than 140 mg/dL      Peak postprandial:   less than 180 mg/dL (1-2 hours)      Critically ill patients:  140 - 180 mg/dL   Review of Glycemic Control  Diabetes history: DM 2 Outpatient Diabetes medications: NPH 14 units daily Current orders for Inpatient glycemic control: resistant correction q 4 hrs (NPO)  Inpatient Diabetes Program Recommendations: Pt given IV solumedrol times one yesterday. Resistant correction has controlled cbg's, however patient had hypoglycemia this am.  May want to decrease correction to moderate q 4 hrs while NPO unless steroid therapy is resumed.  Thank you Rosita Kea, RN, MSN, CDE  Diabetes Inpatient Program Office: [phone] Pager: [phone] 8:00 am to 5:00 pm

## 2014-12-21 NOTE — Telephone Encounter (Signed)
Left message giving verbal orders.  

## 2014-12-21 NOTE — Progress Notes (Signed)
Patient very anxious about Bipap, paged Baltazar Najjar, orders to give 0.5mg  ativan IV, patient resting now, will continue to monitor closely.

## 2014-12-21 NOTE — Progress Notes (Signed)
Hall TEAM 1 - Stepdown/ICU TEAM Progress Note  Judith Boyer [id number] DOB: [birth year] DOA: 12/20/2014 PCP: Jenny Reichmann, MD  Admit HPI / Brief Narrative: 69 y.o. WF PMHx COPD on 2 L O2 at home, HTN, CAD native artery, HLD, DM Type 2.  NOTE; patient last seen by Dr.Michael B Wert [facility] M) 10/28 for same symptoms. Patient was treated at that time with antibiotics, [name], DuoNeb, and [name].   Patient was recently hospitalized with acute on chronic respiratory failure. Her breathing was slow to improve and given advanced disease Palliative Medicine was consulted. Palliative care team attempted to discuss CODE STATUS with the patient on multiple occasions but she apparently refused. Patient has been on a tapering dose of steroids since discharge in August, currently taking 20mg  of prednisone daily. She is worried about blood sugars being high.   Patient has been becoming increasingly short of breath at home over the last week. Pulmonary gave her an office appointment for Thursday but patient felt she could not wait. Patient has been coughing up excessive amount of clear mucus. She feels her chest is very congested despite twice-daily Mucinex. Hears herself wheezing. No fevers. She was unable to use inhaler at home today due to significant shortness of breath. No complaints of peripheral edema.    HPI/Subjective: 11/15 patient alert, unable to stay off BiPAP mask her more then 60 seconds before she becomes extremely tachypnea and has decreased SPO2. Unable to speak in more than 2 word sentences when off BiPAP.  Assessment/Plan:  COPD exacerbation ([place])./Acute on chronic respiratory failure with hypoxia -Continue nebulizers -IV Solu-Medrol 80 mg daily -Continue Mucinex twice daily -Brovana BID -DuoNeb q 4hr -Pulmonary Toilet -Continue Oxygen therapy.at 3L  -Continue Xanax TID daily -helps breathing -Morphine 1 mg PRN feeling SOB -Consider chest CT in A.m.  Noncompliance  medication -See history of present illness  Uncontrolled diabetes mellitus type 2 uncontrolled -10/13 hemoglobin A1c= 8.3 -Monitor CBGs closely on Solu-Medrol -Resistant SSI -Nothing by mouth until patient can stay off of BiPAP   CAD, s/p stenting 2009.  -On chronic Plavix. Echocardiogram January 2015. LDL ejection fraction 55-60%   Goals of care -Had a long meeting with family members (son/wife, husband) and explained the poor prognosis of patient given her continued increasing SOB although on steroids, and patient's unwillingness to use medication prescribed by pulmonologist(Tudorza).  -Per son when initially usingTudorza breathing improved however patient did not like the feeling she had in her chest post use. Per Dr. Melvyn Novas note she stated couldn't afford it. Patient also stopped Spiriva; could not urinate Unwillingness to stop smoking. -Counseled family that patient had poor prognosis of leaving hospital. -Counseled family that if patient was intubated probably would not be able to be extubated. Family to discuss change of CODE STATUS with patient and inform staff.   Code Status: FULL Family Communication: family present at time of exam Disposition Plan: Awaiting patient and family's decision on DO NOT RESUSCITATE    Consultants:   Procedure/Significant Events:    Culture 11/15 MRSA by PCR negative 11/15 strep pneumo urine antigen pending 11/15 Legionella urine antigen pending 11/15 sputum pending   Antibiotics: Levofloxacin 11/14>>   DVT prophylaxis: Lovenox   Devices    LINES / TUBES:      Continuous Infusions:   Objective: VITAL SIGNS: Temp: 98.5 F (36.9 C) (11/15 2000) Temp Source: Oral (11/15 2000) BP: 127/66 mmHg (11/15 2000) Pulse Rate: 106 (11/15 2133) SPO2; FIO2:   Intake/Output Summary (Last 24  hours) at 12/21/14 2136 Last data filed at 12/21/14 1843  Gross per 24 hour  Intake    290 ml  Output    370 ml  Net    -80 ml      Exam: General: Unable to come off BiPAP(could only speak in 2 word sentence), Acute on Chronic respiratory distress Eyes: Negative headache, ,negative scleral hemorrhage ENT: Negative Runny nose, negative gingival bleeding, Neck:  Negative scars, masses, torticollis, lymphadenopathy, JVD Lungs: Poor air movement all fields, mild expitory wheezes, negative crackles Cardiovascular: Tachycardic, Regular rhythm without murmur gallop or rub normal S1 and S2 Abdomen:negative abdominal pain, nondistended, positive soft, bowel sounds, no rebound, no ascites, no appreciable mass Extremities: No significant cyanosis, clubbing, or edema bilateral lower extremities Psychiatric:  Positive Anxiety,  Neurologic:  Cranial nerves II through XII intact, tongue/uvula midline, all extremities muscle strength 5/5, sensation intact throughout, negative dysarthria, negative expressive aphasia, negative receptive aphasia.   Data Reviewed: Basic Metabolic Panel:  Recent Labs Lab 12/20/14 1350  NA 140  K 3.8  CL 98*  CO2 32  GLUCOSE 176*  BUN 15  CREATININE 0.48  CALCIUM 9.4   Liver Function Tests: No results for input(s): AST, ALT, ALKPHOS, BILITOT, PROT, ALBUMIN in the last 168 hours. No results for input(s): LIPASE, AMYLASE in the last 168 hours. No results for input(s): AMMONIA in the last 168 hours. CBC:  Recent Labs Lab 12/20/14 1350  WBC 12.4*  NEUTROABS 11.3*  HGB 11.5*  HCT 37.6  MCV 93.3  PLT 340   Cardiac Enzymes: No results for input(s): CKTOTAL, CKMB, CKMBINDEX, TROPONINI in the last 168 hours. BNP (last 3 results)  Recent Labs  02/11/14 1655 06/18/14 1740 08/13/14 1320  BNP 107.0* 12.2 12.0    ProBNP (last 3 results) No results for input(s): PROBNP in the last 8760 hours.  CBG:  Recent Labs Lab 12/21/14 0731 12/21/14 0853 12/21/14 1117 12/21/14 1650 12/21/14 2011  GLUCAP 103* 109* 170* 128* 117*    Recent Results (from the past 240 hour(s))  MRSA  PCR Screening     Status: None   Collection Time: 12/21/14  1:34 AM  Result Value Ref Range Status   MRSA by PCR NEGATIVE NEGATIVE Final    Comment:        The GeneXpert MRSA Assay (FDA approved for NASAL specimens only), is one component of a comprehensive MRSA colonization surveillance program. It is not intended to diagnose MRSA infection nor to guide or monitor treatment for MRSA infections.      Studies:  Recent x-ray studies have been reviewed in detail by the Attending Physician  Scheduled Meds:  Scheduled Meds: . [START ON 12/22/2014] ALPRAZolam  0.25 mg Oral TID  . antiseptic oral rinse  7 mL Mouth Rinse BID  . arformoterol  15 mcg Nebulization BID  . cefTRIAXone (ROCEPHIN)  IV  1 g Intravenous Q24H  . clopidogrel  75 mg Oral q morning - 10a  . enoxaparin (LOVENOX) injection  30 mg Subcutaneous Q24H  . guaiFENesin  600 mg Oral Daily  . insulin aspart  0-20 Units Subcutaneous 6 times per day  . ipratropium-albuterol  3 mL Nebulization Q4H  . levofloxacin (LEVAQUIN) IV  750 mg Intravenous Q48H  . methylPREDNISolone (SOLU-MEDROL) injection  80 mg Intravenous Q24H  . montelukast  10 mg Oral Daily  . pravastatin  80 mg Oral QPM    Time spent on care of this patient: 40 mins   Sherryann Frese, Geraldo Docker , MD  [facility] Office  [phone] Pager - [phone]  On-Call/Text Page:      [email or web address]      password [email or web address]  If 7PM-7AM, please contact night-coverage [email or web address] Password [email or web address] 12/21/2014, 9:36 PM   LOS: 0 days   Care during the described time interval was provided by me .  I have reviewed this patient's available data, including medical history, events of note, physical examination, and all test results as part of my evaluation. I have personally reviewed and interpreted all radiology studies.   Dia Crawford, MD [phone] Pager

## 2014-12-21 NOTE — Telephone Encounter (Signed)
Okay to give verbal order for skilled nursing

## 2014-12-21 NOTE — Progress Notes (Signed)
[facility]  Patient Status: Active (receiving services up to time of hospitalization)  [facility] is providing the following services: RN  If patient discharges after hours, please call [phone].   Judith Boyer 12/21/2014, 12:01 PM

## 2014-12-21 NOTE — Care Management Note (Signed)
Case Management Note  Patient Details  Name: Judith Boyer MRN: [id number] Date of Birth: 1945/05/25  Subjective/Objective:     Adm w copd exacerb               Action/Plan: lives w fam, pcp dr Remo Lipps daub, act w [facility]   Expected Discharge Date:                  Expected Discharge Plan:  [place]  In-House Referral:     Discharge planning Services  CM Consult  Post Acute Care Choice:  Resumption of Svcs/PTA Provider Choice offered to:     DME Arranged:    DME Agency:     HH Arranged:  RN [place] Agency:  [facility]  Status of Service:     Medicare Important Message Given:    Date Medicare IM Given:    Medicare IM give by:    Date Additional Medicare IM Given:    Additional Medicare Important Message give by:     If discussed at [place] of Stay Meetings, dates discussed:    Additional Comments: alerted donna w [facility] of pt's adm.  Lacretia Leigh, RN 12/21/2014, 11:35 AM

## 2014-12-21 NOTE — Progress Notes (Signed)
Patient transferred from 5W to [facility]. Patient placed on BIPAP per md order. Patient placed on 12/6 and 40%FIO2. Patient tolerating well sat 100%. ABG pending. RT will continue to monitor.

## 2014-12-22 LAB — POCT I-STAT 3, ART BLOOD GAS (G3+)
Acid-Base Excess: 7 mmol/L — ABNORMAL HIGH (ref 0.0–2.0)
BICARBONATE: 33.5 meq/L — AB (ref 20.0–24.0)
O2 Saturation: 96 %
TCO2: 35 mmol/L (ref 0–100)
pCO2 arterial: 55.8 mmHg — ABNORMAL HIGH (ref 35.0–45.0)
pH, Arterial: 7.384 (ref 7.350–7.450)
pO2, Arterial: 81 mmHg (ref 80.0–100.0)

## 2014-12-22 LAB — GLUCOSE, CAPILLARY
GLUCOSE-CAPILLARY: 158 mg/dL — AB (ref 65–99)
Glucose-Capillary: 108 mg/dL — ABNORMAL HIGH (ref 65–99)
Glucose-Capillary: 125 mg/dL — ABNORMAL HIGH (ref 65–99)
Glucose-Capillary: 166 mg/dL — ABNORMAL HIGH (ref 65–99)
Glucose-Capillary: 345 mg/dL — ABNORMAL HIGH (ref 65–99)

## 2014-12-22 LAB — COMPREHENSIVE METABOLIC PANEL
ALK PHOS: 48 U/L (ref 38–126)
ALT: 19 U/L (ref 14–54)
ANION GAP: 11 (ref 5–15)
AST: 18 U/L (ref 15–41)
Albumin: 3.1 g/dL — ABNORMAL LOW (ref 3.5–5.0)
BILIRUBIN TOTAL: 0.4 mg/dL (ref 0.3–1.2)
BUN: 24 mg/dL — ABNORMAL HIGH (ref 6–20)
CALCIUM: 8.8 mg/dL — AB (ref 8.9–10.3)
CO2: 33 mmol/L — ABNORMAL HIGH (ref 22–32)
Chloride: 95 mmol/L — ABNORMAL LOW (ref 101–111)
Creatinine, Ser: 0.52 mg/dL (ref 0.44–1.00)
Glucose, Bld: 161 mg/dL — ABNORMAL HIGH (ref 65–99)
Potassium: 4.1 mmol/L (ref 3.5–5.1)
Sodium: 139 mmol/L (ref 135–145)
TOTAL PROTEIN: 5.4 g/dL — AB (ref 6.5–8.1)

## 2014-12-22 LAB — CBC WITH DIFFERENTIAL/PLATELET
BASOS ABS: 0 10*3/uL (ref 0.0–0.1)
BASOS PCT: 0 %
Eosinophils Absolute: 0.1 10*3/uL (ref 0.0–0.7)
Eosinophils Relative: 0 %
HEMATOCRIT: 38 % (ref 36.0–46.0)
HEMOGLOBIN: 11.7 g/dL — AB (ref 12.0–15.0)
Lymphocytes Relative: 3 %
Lymphs Abs: 0.6 10*3/uL — ABNORMAL LOW (ref 0.7–4.0)
MCH: 28.7 pg (ref 26.0–34.0)
MCHC: 30.8 g/dL (ref 30.0–36.0)
MCV: 93.1 fL (ref 78.0–100.0)
MONO ABS: 0.3 10*3/uL (ref 0.1–1.0)
Monocytes Relative: 2 %
NEUTROS ABS: 16.8 10*3/uL — AB (ref 1.7–7.7)
NEUTROS PCT: 95 %
Platelets: 332 10*3/uL (ref 150–400)
RBC: 4.08 MIL/uL (ref 3.87–5.11)
RDW: 13.4 % (ref 11.5–15.5)
WBC: 17.8 10*3/uL — AB (ref 4.0–10.5)

## 2014-12-22 LAB — STREP PNEUMONIAE URINARY ANTIGEN: STREP PNEUMO URINARY ANTIGEN: NEGATIVE

## 2014-12-22 LAB — MAGNESIUM: Magnesium: 2.1 mg/dL (ref 1.7–2.4)

## 2014-12-22 LAB — LACTIC ACID, PLASMA: LACTIC ACID, VENOUS: 0.9 mmol/L (ref 0.5–2.0)

## 2014-12-22 MED ORDER — GUAIFENESIN ER 600 MG PO TB12
1200.0000 mg | ORAL_TABLET | Freq: Every day | ORAL | Status: DC
  Administered 2014-12-23 – 2014-12-31 (×9): 1200 mg via ORAL
  Filled 2014-12-22 (×9): qty 2

## 2014-12-22 MED ORDER — METHYLPREDNISOLONE SODIUM SUCC 125 MG IJ SOLR
60.0000 mg | Freq: Two times a day (BID) | INTRAMUSCULAR | Status: DC
  Administered 2014-12-22 – 2014-12-25 (×5): 60 mg via INTRAVENOUS
  Filled 2014-12-22 (×7): qty 2

## 2014-12-22 MED ORDER — IPRATROPIUM-ALBUTEROL 0.5-2.5 (3) MG/3ML IN SOLN: 3.0000 mL | Freq: Four times a day (QID) | RESPIRATORY_TRACT | Status: DC

## 2014-12-22 MED ORDER — BUDESONIDE 0.25 MG/2ML IN SUSP
0.2500 mg | Freq: Two times a day (BID) | RESPIRATORY_TRACT | Status: DC
  Administered 2014-12-22 – 2014-12-25 (×6): 0.25 mg via RESPIRATORY_TRACT
  Filled 2014-12-22 (×6): qty 2

## 2014-12-22 NOTE — Progress Notes (Signed)
Patient has been off bipap for 3 hours now, she is alert and oriented, she has been taking sips of drink and had cup of applesauce with meds once bipap had been removed.  She is less anxious now, granddaughter was at bedside for extended period while she was eating. Will continue to monitor

## 2014-12-22 NOTE — Progress Notes (Signed)
RT educated patient on Flutter therapy. 10 xQ1h. Pt will do as tolerated.

## 2014-12-22 NOTE — Evaluation (Signed)
Occupational Therapy Evaluation Patient Details Name: Judith Boyer MRN: [id number] DOB: [birth year] Today's Date: 12/22/2014    History of Present Illness Judith Boyer is a 69 y.o. female  with COPD, DM2, HTN, CAD, MI 2013. Patient admitted with acute on chronic respiratory failure   Clinical Impression    Pt admitted with above. She demonstrates the below listed deficits and will benefit from continued OT to maximize safety and independence with BADLs.  Pt presents to OT with generalized weakness and significant deconditioning.  Currently, she is able to tolerate only minimal EOB activity before fatiguing.  Overall, requires max A for ADLs due to poor activity tolerance.   Likely will require SNF at discharge.      Follow Up Recommendations  SNF;Supervision/Assistance - 24 hour    Equipment Recommendations  3 in 1 bedside comode;Wheelchair (measurements OT);Hospital bed    Recommendations for Other Services       Precautions / Restrictions Precautions Precautions: Fall Precaution Comments: O2 dependent      Mobility Bed Mobility Overal bed mobility: Needs Assistance Bed Mobility: Supine to Sit;Sit to Supine     Supine to sit: HOB elevated;Supervision Sit to supine: Supervision;HOB elevated      Transfers Overall transfer level: Needs assistance Equipment used: Rolling walker (2 wheeled) Transfers: Sit to/from Stand Sit to Stand: Min guard         General transfer comment: Pt unable with OT     Balance Overall balance assessment: Needs assistance         Standing balance support: Single extremity supported;During functional activity Standing balance-Leahy Scale: Poor Standing balance comment: standing at sink to wash hands leaning for support at counter                            ADL Overall ADL's : Needs assistance/impaired Eating/Feeding: Set up;Bed level   Grooming: Wash/dry hands;Wash/dry face;Oral care;Brushing hair;Set  up;Bed level   Upper Body Bathing: Moderate assistance;Bed level   Lower Body Bathing: Maximal assistance;Sit to/from stand   Upper Body Dressing : Moderate assistance;Sitting;Bed level   Lower Body Dressing: Maximal assistance;Sit to/from stand   Toilet Transfer: Min guard;RW;Stand-pivot   Toileting- Water quality scientist and Hygiene: Moderate assistance;Sit to/from stand       Functional mobility during ADLs: Minimal assistance;Min guard;Rolling walker       Vision     Perception     Praxis      Pertinent Vitals/Pain Pain Assessment: No/denies pain     Hand Dominance Right   Extremity/Trunk Assessment Upper Extremity Assessment Upper Extremity Assessment: Generalized weakness   Lower Extremity Assessment Lower Extremity Assessment: Defer to PT evaluation   Cervical / Trunk Assessment Cervical / Trunk Assessment: Kyphotic   Communication Communication Communication: No difficulties   Cognition Arousal/Alertness: Awake/alert Behavior During Therapy: Anxious Overall Cognitive Status: Within Functional Limits for tasks assessed                     General Comments       Exercises       Shoulder Instructions      Home Living Family/patient expects to be discharged to:: Private residence Living Arrangements: Children Available Help at Discharge: Family;Available PRN/intermittently   Home Access: Stairs to enter Entrance Stairs-Number of Steps: 2 Entrance Stairs-Rails: Right Home Layout: One level     Bathroom Shower/Tub: Tub/shower unit         Home Equipment: Environmental consultant - 2  wheels;Shower seat;Other (comment) (O2)   Additional Comments: son works      Prior Functioning/Environment Level of Independence: Independent        Comments: uses 2L O2 and reports she has been sponge bathing     OT Diagnosis: Generalized weakness   OT Problem List: Decreased strength;Decreased activity tolerance;Impaired balance (sitting and/or  standing);Decreased safety awareness;Cardiopulmonary status limiting activity   OT Treatment/Interventions: Self-care/ADL training;Neuromuscular education;Therapeutic activities;Patient/family education;Balance training;DME and/or AE instruction    OT Goals(Current goals can be found in the care plan section) Acute Rehab OT Goals Patient Stated Goal: To get stronger OT Goal Formulation: With patient Time For Goal Achievement: 01/05/15 Potential to Achieve Goals: Fair ADL Goals Pt Will Perform Grooming: with set-up;sitting Pt Will Perform Upper Body Bathing: with set-up;with supervision;sitting Pt Will Perform Lower Body Bathing: with min assist;sit to/from stand Pt Will Transfer to Toilet: with min guard assist;ambulating;regular height toilet;grab bars Pt Will Perform Toileting - Clothing Manipulation and hygiene: with min guard assist;sit to/from stand  OT Frequency: Min 2X/week   Barriers to D/C: Decreased caregiver support          Co-evaluation              End of Session Equipment Utilized During Treatment: Oxygen Nurse Communication: Mobility status  Activity Tolerance: Patient limited by fatigue Patient left: in bed;with call bell/phone within reach;with family/visitor present   Time: [id number] OT Time Calculation (min): 19 min Charges:  OT General Charges $OT Visit: 1 Procedure OT Evaluation $Initial OT Evaluation Tier I: 1 Procedure G-Codes:    Lucille Passy M January 07, 2015, 5:31 PM

## 2014-12-22 NOTE — Evaluation (Signed)
Physical Therapy Evaluation Patient Details Name: Judith Boyer MRN: [id number] DOB: 1946/01/24 Today's Date: 12/22/2014   History of Present Illness  Judith Boyer is a 69 y.o. female  with COPD, DM2, HTN, CAD, MI 2013. Patient admitted with acute on chronic respiratory failure  Clinical Impression  Patient presents with decreased independence with mobility due to deficits listed in PT problem list.  Not tolerating mobility well despite holding sats with ambulation today.  Feel will need SNF level rehab at d/c.  PT to follow acutely as tolerated.    Follow Up Recommendations SNF    Equipment Recommendations  Wheelchair (measurements PT)    Recommendations for Other Services       Precautions / Restrictions Precautions Precautions: Fall Precaution Comments: O2 dependent      Mobility  Bed Mobility Overal bed mobility: Needs Assistance Bed Mobility: Supine to Sit;Sit to Supine     Supine to sit: HOB elevated Sit to supine: HOB elevated      Transfers Overall transfer level: Needs assistance Equipment used: Rolling walker (2 wheeled) Transfers: Sit to/from Stand Sit to Stand: Min guard         General transfer comment: increased time, pulls up on walker  Ambulation/Gait Ambulation/Gait assistance: Min guard Ambulation Distance (Feet): 8 Feet (x 2) Assistive device: Rolling walker (2 wheeled) Gait Pattern/deviations: Step-through pattern;Decreased stride length     General Gait Details: shaky and visibly weak, dysnpneic throughout though sats 94% and above mobilizing on 6 L  Stairs            Wheelchair Mobility    Modified Rankin (Stroke Patients Only)       Balance Overall balance assessment: Needs assistance         Standing balance support: Single extremity supported;During functional activity Standing balance-Leahy Scale: Poor Standing balance comment: standing at sink to wash hands leaning for support at counter                              Pertinent Vitals/Pain Pain Assessment: No/denies pain    Home Living Family/patient expects to be discharged to:: Private residence Living Arrangements: Children Available Help at Discharge: Family;Available PRN/intermittently   Home Access: Stairs to enter Entrance Stairs-Rails: Right Entrance Stairs-Number of Steps: 2 Home Layout: One level Home Equipment: Walker - 2 wheels;Shower seat;Other (comment) (O2) Additional Comments: son works    Prior Function Level of Independence: Independent         Comments: uses 2L O2      Hand Dominance        Extremity/Trunk Assessment   Upper Extremity Assessment: Generalized weakness           Lower Extremity Assessment: Generalized weakness         Communication   Communication: No difficulties  Cognition Arousal/Alertness: Awake/alert Behavior During Therapy: WFL for tasks assessed/performed Overall Cognitive Status: Within Functional Limits for tasks assessed                      General Comments      Exercises        Assessment/Plan    PT Assessment Patient needs continued PT services  PT Diagnosis Generalized weakness;Difficulty walking   PT Problem List Decreased strength;Decreased activity tolerance;Decreased balance;Decreased mobility;Cardiopulmonary status limiting activity;Decreased knowledge of use of DME  PT Treatment Interventions DME instruction;Gait training;Functional mobility training;Stair training;Therapeutic activities;Therapeutic exercise;Balance training;Patient/family education   PT Goals (Current goals  can be found in the Care Plan section) Acute Rehab PT Goals Patient Stated Goal: To get stronger PT Goal Formulation: With patient Time For Goal Achievement: 01/05/15 Potential to Achieve Goals: Fair    Frequency Min 3X/week   Barriers to discharge Decreased caregiver support son works    Co-evaluation               End of Session  Equipment Utilized During Treatment: Oxygen Activity Tolerance: Patient limited by fatigue Patient left: in bed           Time: 1540-1605 PT Time Calculation (min) (ACUTE ONLY): 25 min   Charges:   PT Evaluation $Initial PT Evaluation Tier I: 1 Procedure PT Treatments $Gait Training: 8-22 mins   PT G Codes:        WYNN,CYNDI December 24, 2014, 5:19 PM  Magda Kiel, [place] 24-Dec-2014

## 2014-12-22 NOTE — Progress Notes (Signed)
[facility] TEAM 1 - Stepdown/ICU TEAM PROGRESS NOTE  Judith Boyer [id number] DOB: 1945-05-07 DOA: 12/20/2014 PCP: Jenny Reichmann, MD  Admit HPI / Brief Narrative: 69 yo F Hx COPD on 2 L O2 at home, HTN, CAD, HLD, and DM 2 who was recently hospitalized with acute on chronic respiratory failure. Her breathing was slow to improve and given her advanced disease Palliative Medicine was consulted. Palliative Care Team attempted to discuss CODE STATUS with the patient on multiple occasions but she refused. Patient had been on a tapering dose of steroids since her discharge in August 2016,  taking 20mg  of prednisone daily at the time of her re-admit  She returned to the hospital complaining of one week of rapidly progressive/increasing shortness of breath.   HPI/Subjective: The patient has been liberated from BiPAP as of this morning.  She states she feels "little bit better" but is still nowhere near her baseline respiratory status.  She denies chest pain nausea vomiting or abdominal pain but states she feels extremely weak in general.  Assessment/Plan:  Acute bronchospastic COPD exacerbation / Acute on chronic respiratory failure with hypoxia -Continue usual aggressive medical therapy for an acute bronchospastic COPD exacerbation  Sinus Tachycardia  -Due to above  Noncompliance with medications -Continue to counsel patient on absolute need to strictly adhere to her prescribed medical regimen  DM 2 uncontrolled -11/2014 A1c 8.3 - follow CBG closely and administer sliding scale insulin as indicated while on steroid therapy  CAD s/p stenting 2009   -On chronic Plavix. Echocardiogram January 2015. LDL ejection fraction 55-60%  Code Status: DO NOT INTUBATE Family Communication: no family present at time of exam Disposition Plan: SDU  Consultants: none  Procedures: none  Antibiotics: Levaquin 11/14 >  DVT prophylaxis: lovenox   Objective: Blood pressure 130/58, pulse 113,  temperature 98.3 F (36.8 C), temperature source Axillary, resp. rate 24, height 5\' 1"  (1.549 m), weight 44.7 kg (98 lb 8.7 oz), SpO2 99 %.  Intake/Output Summary (Last 24 hours) at 12/22/14 1525 Last data filed at 12/22/14 0944  Gross per 24 hour  Intake     50 ml  Output    450 ml  Net   -400 ml     Exam: General: Moderate respiratory distress at rest with the need to pause between short sentences Lungs: Very poor air movement diffusely with expiratory wheezes with no focal crackles Cardiovascular: Tachycardic but regular without appreciable murmur but heart sounds are quite distant Abdomen: Nontender, nondistended, soft, bowel sounds positive, no rebound, no ascites, no appreciable mass Extremities: No significant cyanosis, clubbing, or edema bilateral lower extremities  Data Reviewed: Basic Metabolic Panel:  Recent Labs Lab 12/20/14 1350 12/22/14 0240  NA 140 139  K 3.8 4.1  CL 98* 95*  CO2 32 33*  GLUCOSE 176* 161*  BUN 15 24*  CREATININE 0.48 0.52  CALCIUM 9.4 8.8*  MG  --  2.1    CBC:  Recent Labs Lab 12/20/14 1350 12/22/14 0240  WBC 12.4* 17.8*  NEUTROABS 11.3* 16.8*  HGB 11.5* 11.7*  HCT 37.6 38.0  MCV 93.3 93.1  PLT 340 332    Liver Function Tests:  Recent Labs Lab 12/22/14 0240  AST 18  ALT 19  ALKPHOS 48  BILITOT 0.4  PROT 5.4*  ALBUMIN 3.1*   Coags:  Recent Labs Lab 12/20/14 1910  INR 1.05    Recent Labs Lab 12/20/14 1910  APTT 24   CBG:  Recent Labs Lab 12/21/14 2011 12/22/14  0010 12/22/14 0359 12/22/14 0820 12/22/14 1157  GLUCAP 117* 108* 166* 158* 345*    Recent Results (from the past 240 hour(s))  MRSA PCR Screening     Status: None   Collection Time: 12/21/14  1:34 AM  Result Value Ref Range Status   MRSA by PCR NEGATIVE NEGATIVE Final    Comment:        The GeneXpert MRSA Assay (FDA approved for NASAL specimens only), is one component of a comprehensive MRSA colonization surveillance program. It is  not intended to diagnose MRSA infection nor to guide or monitor treatment for MRSA infections.      Studies:   Recent x-ray studies have been reviewed in detail by the Attending Physician  Scheduled Meds:  Scheduled Meds: . ALPRAZolam  0.25 mg Oral TID  . antiseptic oral rinse  7 mL Mouth Rinse BID  . arformoterol  15 mcg Nebulization BID  . cefTRIAXone (ROCEPHIN)  IV  1 g Intravenous Q24H  . clopidogrel  75 mg Oral q morning - 10a  . enoxaparin (LOVENOX) injection  30 mg Subcutaneous Q24H  . guaiFENesin  600 mg Oral Daily  . insulin aspart  0-20 Units Subcutaneous 6 times per day  . ipratropium-albuterol  3 mL Nebulization Q4H  . levofloxacin (LEVAQUIN) IV  750 mg Intravenous Q48H  . methylPREDNISolone (SOLU-MEDROL) injection  80 mg Intravenous Q24H  . montelukast  10 mg Oral Daily  . pravastatin  80 mg Oral QPM    Time spent on care of this patient: 35 mins   Bushra Denman T , MD   [facility] Office  [phone] Pager - Text Page per Shea Evans as per below:  On-Call/Text Page:      [email or web address]      password [email or web address]  If 7PM-7AM, please contact night-coverage [email or web address] Password [email or web address] 12/22/2014, 3:25 PM   LOS: 1 day

## 2014-12-23 ENCOUNTER — Ambulatory Visit: Payer: Medicare Other | Admitting: Adult Health

## 2014-12-23 DIAGNOSIS — Z9119 Patient's noncompliance with other medical treatment and regimen: Secondary | ICD-10-CM

## 2014-12-23 DIAGNOSIS — Z720 Tobacco use: Secondary | ICD-10-CM

## 2014-12-23 DIAGNOSIS — Z91199 Patient's noncompliance with other medical treatment and regimen due to unspecified reason: Secondary | ICD-10-CM | POA: Diagnosis present

## 2014-12-23 LAB — GLUCOSE, CAPILLARY
GLUCOSE-CAPILLARY: 133 mg/dL — AB (ref 65–99)
GLUCOSE-CAPILLARY: 135 mg/dL — AB (ref 65–99)
GLUCOSE-CAPILLARY: 266 mg/dL — AB (ref 65–99)
Glucose-Capillary: 138 mg/dL — ABNORMAL HIGH (ref 65–99)
Glucose-Capillary: 187 mg/dL — ABNORMAL HIGH (ref 65–99)
Glucose-Capillary: 241 mg/dL — ABNORMAL HIGH (ref 65–99)
Glucose-Capillary: 201 mg/dL — ABNORMAL HIGH (ref 65–99)

## 2014-12-23 LAB — CBC WITH DIFFERENTIAL/PLATELET
Basophils Absolute: 0 10*3/uL (ref 0.0–0.1)
Basophils Relative: 0 %
EOS ABS: 0 10*3/uL (ref 0.0–0.7)
EOS PCT: 0 %
HCT: 37 % (ref 36.0–46.0)
Hemoglobin: 11.5 g/dL — ABNORMAL LOW (ref 12.0–15.0)
LYMPHS ABS: 0.4 10*3/uL — AB (ref 0.7–4.0)
LYMPHS PCT: 4 %
MCH: 29 pg (ref 26.0–34.0)
MCHC: 31.1 g/dL (ref 30.0–36.0)
MCV: 93.4 fL (ref 78.0–100.0)
MONO ABS: 0.4 10*3/uL (ref 0.1–1.0)
MONOS PCT: 3 %
Neutro Abs: 10.9 10*3/uL — ABNORMAL HIGH (ref 1.7–7.7)
Neutrophils Relative %: 93 %
Platelets: 330 10*3/uL (ref 150–400)
RBC: 3.96 MIL/uL (ref 3.87–5.11)
RDW: 13.2 % (ref 11.5–15.5)
WBC: 11.7 10*3/uL — ABNORMAL HIGH (ref 4.0–10.5)

## 2014-12-23 LAB — MAGNESIUM: MAGNESIUM: 2.2 mg/dL (ref 1.7–2.4)

## 2014-12-23 LAB — COMPREHENSIVE METABOLIC PANEL
ALBUMIN: 3 g/dL — AB (ref 3.5–5.0)
ALT: 20 U/L (ref 14–54)
AST: 18 U/L (ref 15–41)
Alkaline Phosphatase: 49 U/L (ref 38–126)
Anion gap: 7 (ref 5–15)
BILIRUBIN TOTAL: 0.2 mg/dL — AB (ref 0.3–1.2)
BUN: 20 mg/dL (ref 6–20)
CHLORIDE: 98 mmol/L — AB (ref 101–111)
CO2: 35 mmol/L — ABNORMAL HIGH (ref 22–32)
CREATININE: 0.5 mg/dL (ref 0.44–1.00)
Calcium: 8.7 mg/dL — ABNORMAL LOW (ref 8.9–10.3)
GFR calc Af Amer: >60 mL/min (ref >60)
GLUCOSE: 139 mg/dL — AB (ref 65–99)
POTASSIUM: 4.4 mmol/L (ref 3.5–5.1)
Sodium: 140 mmol/L (ref 135–145)
Total Protein: 5.8 g/dL — ABNORMAL LOW (ref 6.5–8.1)

## 2014-12-23 LAB — LEGIONELLA PNEUMOPHILA SEROGP 1 UR AG: L. PNEUMOPHILA SEROGP 1 UR AG: NEGATIVE

## 2014-12-23 MED ORDER — GLUCERNA SHAKE PO LIQD
237.0000 mL | Freq: Three times a day (TID) | ORAL | Status: DC
  Administered 2014-12-23 – 2014-12-31 (×14): 237 mL via ORAL

## 2014-12-23 MED ORDER — DILTIAZEM HCL 30 MG PO TABS
30.0000 mg | ORAL_TABLET | Freq: Three times a day (TID) | ORAL | Status: DC
  Administered 2014-12-23 – 2014-12-28 (×15): 30 mg via ORAL
  Filled 2014-12-23 (×16): qty 1

## 2014-12-23 NOTE — Progress Notes (Signed)
Occupational Therapy Treatment Patient Details Name: Judith Boyer MRN: [id number] DOB: Feb 11, 1945 Today's Date: 12/23/2014    History of present illness Judith Boyer is a 69 y.o. female  with COPD, DM2, HTN, CAD, MI 2013. Patient admitted with acute on chronic respiratory failure   OT comments  Pt willing to participate with OT.  She fatigues quite quickly with minimal activity and requires multiple rest breaks.  Requires mod - max A for ADLs due to fatigue and min guard assist for functional transfers.  02 sats 93% on 3L and HR 125.     Follow Up Recommendations  SNF;Supervision/Assistance - 24 hour    Equipment Recommendations  3 in 1 bedside comode;Wheelchair (measurements OT);Hospital bed    Recommendations for Other Services      Precautions / Restrictions Precautions Precautions: Fall Precaution Comments: O2 dependent       Mobility Bed Mobility Overal bed mobility: Needs Assistance Bed Mobility: Supine to Sit;Sit to Supine     Supine to sit: HOB elevated;Supervision Sit to supine: Supervision;HOB elevated      Transfers Overall transfer level: Needs assistance   Transfers: Sit to/from Stand;Stand Pivot Transfers Sit to Stand: Min guard Stand pivot transfers: Min guard            Balance           Standing balance support: Single extremity supported Standing balance-Leahy Scale: Poor                     ADL Overall ADL's : Needs assistance/impaired                         Toilet Transfer: Min guard;Stand-pivot;BSC   Toileting- Clothing Manipulation and Hygiene: Minimal assistance;Sit to/from stand         General ADL Comments: Pt fatigues rapidly with activity.  She stood for ~2 mins, and performed 6 reps reaching with UEs, fatigued requiring seated rest break.  Returned to standing for 45 seconds before fatiguing and having to sit.  Pt then performed stand pivot transfer to [facility], and back to bed        Vision                     Perception     Praxis      Cognition   Behavior During Therapy: Anxious Overall Cognitive Status: Within Functional Limits for tasks assessed                       Extremity/Trunk Assessment               Exercises     Shoulder Instructions       General Comments      Pertinent Vitals/ Pain       Pain Assessment: No/denies pain  Home Living                                          Prior Functioning/Environment              Frequency Min 2X/week     Progress Toward Goals  OT Goals(current goals can now be found in the care plan section)     ADL Goals Pt Will Perform Grooming: with set-up;sitting Pt Will Perform Upper Body Bathing: with set-up;with supervision;sitting Pt Will Perform Lower  Body Bathing: with min assist;sit to/from stand Pt Will Transfer to Toilet: with min guard assist;ambulating;regular height toilet;grab bars Pt Will Perform Toileting - Clothing Manipulation and hygiene: with min guard assist;sit to/from stand  Plan Discharge plan remains appropriate    Co-evaluation                 End of Session Equipment Utilized During Treatment: Oxygen   Activity Tolerance Patient limited by fatigue   Patient Left in bed;with call bell/phone within reach   Nurse Communication Mobility status        Time: [id number] OT Time Calculation (min): 21 min  Charges: OT General Charges $OT Visit: 1 Procedure  Lucille Passy M 12/23/2014, 4:10 PM

## 2014-12-23 NOTE — Progress Notes (Signed)
Pt says that she would like to go as long as she can without BIPAP tonight. I told her that was fine. I told her we will watch her and if we fell the BIPAP is needed we will put it on her. Pt is ok with that. RT will monitor.

## 2014-12-23 NOTE — Progress Notes (Signed)
Judith Boyer - Stepdown/ICU TEAM Progress Note  Judith Boyer [id number] DOB: 1945-03-11 DOA: 12/20/2014 PCP: Judith Reichmann, MD  Admit HPI / Brief Narrative: 69 y.o. WF PMHx COPD on 2 L O2 at home, HTN, CAD native artery, HLD, DM Type 2.  NOTE; patient last seen by Dr.Michael B Boyer [facility] M) 10/28 for same symptoms. Patient was treated at that time with antibiotics, [name], DuoNeb, and [name].   Patient was recently hospitalized with acute on chronic respiratory failure. Her breathing was slow to improve and given advanced disease Palliative Medicine was consulted. Palliative care team attempted to discuss CODE STATUS with the patient on multiple occasions but she apparently refused. Patient has been on a tapering dose of steroids since discharge in August, currently taking 20mg  of prednisone daily. She is worried about blood sugars being high.   Patient has been becoming increasingly short of breath at home over the last week. Pulmonary gave her an office appointment for Thursday but patient felt she could not wait. Patient has been coughing up excessive amount of clear mucus. She feels her chest is very congested despite twice-daily Mucinex. Hears herself wheezing. No fevers. She was unable to use inhaler at home today due to significant shortness of breath. No complaints of peripheral edema.    HPI/Subjective: 11/15 patient alert, unable to stay off BiPAP mask her more then 60 seconds before she becomes extremely tachypnea and has decreased SPO2. Unable to speak in more than 2 word sentences when off BiPAP.  Assessment/Plan:  COPD exacerbation ([place])./Acute on chronic respiratory failure with hypoxia -Continue nebulizers -IV Solu-Medrol 60 mg BID -Continue Mucinex twice daily -Brovana BID -Pulmicort BID -DuoNeb PRN -Physiotherapy Vest BID -Continue Oxygen therapy.at 3L; titrate to maintain SPO2 89-93%  -Continue Xanax TID daily -helps breathing -Morphine Boyer mg PRN  feeling SOB -PT/OT; recommends SNF  Sinus Tachycardia/HTN -Cardizam 30 mg TID  Noncompliance medication -See history of present illness  Uncontrolled diabetes mellitus type 2 uncontrolled -10/13 hemoglobin A1c= 8.3 -Monitor CBGs closely on Solu-Medrol -Resistant SSI -Nothing by mouth until patient can stay off of BiPAP   CAD, s/p stenting 2009.  -On chronic Plavix. Echocardiogram January 2015. LDL ejection fraction 55-60%  Tobacco abuse -Spoke at length to patient detailing absolute need to discontinue tobacco, as the result would be death   Goals of care -Had a long meeting with family members (son/wife, husband) and explained the poor prognosis of patient given her continued increasing SOB although on steroids, and patient's unwillingness to use medication prescribed by pulmonologist(Tudorza).  -Per son when initially usingTudorza breathing improved however patient did not like the feeling she had in her chest post use. Per Dr. Melvyn Boyer note she stated couldn't afford it. Patient also stopped Spiriva; could not urinate Unwillingness to stop smoking. -Counseled family that patient had poor prognosis of leaving hospital. -Counseled family that if patient was intubated probably would not be able to be extubated. Family to discuss change of CODE STATUS with patient and inform staff. -   Code Status: DO NOT RESUSCITATE Family Communication: family present at time of exam Disposition Plan: Pulmonary rehabilitation vs SNF    Consultants:   Procedure/Significant Events:    Culture 11/15 MRSA by PCR negative 11/15 strep pneumo urine antigen pending 11/15 Legionella urine antigen pending 11/15 sputum pending   Antibiotics: Levofloxacin 11/14>>   DVT prophylaxis: Lovenox   Devices    LINES / TUBES:      Continuous Infusions:   Objective: VITAL SIGNS:  Temp: 97.5 F (36.4 C) (11/17 1142) Temp Source: Oral (11/17 1142) BP: 159/79 mmHg (11/17 1142) Pulse Rate:  110 (11/17 1142) SPO2; FIO2:   Intake/Output Summary (Last 24 hours) at 12/23/14 1603 Last data filed at 12/23/14 0753  Gross per 24 hour  Intake      0 ml  Output   1250 ml  Net  -1250 ml     Exam: General: Now on 3LO2 able to speak in short full sentences, Acute on Chronic respiratory distress(but improving) Eyes: Negative headache, ,negative scleral hemorrhage ENT: Negative Runny nose, negative gingival bleeding, Neck:  Negative scars, masses, torticollis, lymphadenopathy, JVD Lungs: Poor air movement all fields, mild expitory wheezes, negative crackles Cardiovascular: Tachycardic, Regular rhythm without murmur gallop or rub normal S1 and S2 Abdomen:negative abdominal pain, nondistended, positive soft, bowel sounds, no rebound, no ascites, no appreciable mass Extremities: No significant cyanosis, clubbing, or edema bilateral lower extremities Psychiatric:  Positive Anxiety,  Neurologic:  Cranial nerves II through XII intact, tongue/uvula midline, all extremities muscle strength 5/5, sensation intact throughout, negative dysarthria, negative expressive aphasia, negative receptive aphasia.   Data Reviewed: Basic Metabolic Panel:  Recent Labs Lab 12/20/14 1350 12/22/14 0240 12/23/14 0310  NA 140 139 140  K 3.8 4.Boyer 4.4  CL 98* 95* 98*  CO2 32 33* 35*  GLUCOSE 176* 161* 139*  BUN 15 24* 20  CREATININE 0.48 0.52 0.50  CALCIUM 9.4 8.8* 8.7*  MG  --  2.Boyer 2.2   Liver Function Tests:  Recent Labs Lab 12/22/14 0240 12/23/14 0310  AST 18 18  ALT 19 20  ALKPHOS 48 49  BILITOT 0.4 0.2*  PROT 5.4* 5.8*  ALBUMIN 3.Boyer* 3.0*   No results for input(s): LIPASE, AMYLASE in the last 168 hours. No results for input(s): AMMONIA in the last 168 hours. CBC:  Recent Labs Lab 12/20/14 1350 12/22/14 0240 12/23/14 0310  WBC 12.4* 17.8* 11.7*  NEUTROABS 11.3* 16.8* 10.9*  HGB 11.5* 11.7* 11.5*  HCT 37.6 38.0 37.0  MCV 93.3 93.Boyer 93.4  PLT 340 332 330   Cardiac Enzymes: No  results for input(s): CKTOTAL, CKMB, CKMBINDEX, TROPONINI in the last 168 hours. BNP (last 3 results)  Recent Labs  02/11/14 1655 06/18/14 1740 08/13/14 1320  BNP 107.0* 12.2 12.0    ProBNP (last 3 results) No results for input(s): PROBNP in the last 8760 hours.  CBG:  Recent Labs Lab 12/22/14 2000 12/23/14 12/23/14 0255 12/23/14 0739 12/23/14 1145  GLUCAP 133* 187* 138* 135* 266*    Recent Results (from the past 240 hour(s))  MRSA PCR Screening     Status: None   Collection Time: 12/21/14  Boyer:34 AM  Result Value Ref Range Status   MRSA by PCR NEGATIVE NEGATIVE Final    Comment:        The GeneXpert MRSA Assay (FDA approved for NASAL specimens only), is one component of a comprehensive MRSA colonization surveillance program. It is not intended to diagnose MRSA infection nor to guide or monitor treatment for MRSA infections.      Studies:  Recent x-ray studies have been reviewed in detail by the Attending Physician  Scheduled Meds:  Scheduled Meds: . ALPRAZolam  0.25 mg Oral TID  . antiseptic oral rinse  7 mL Mouth Rinse BID  . arformoterol  15 mcg Nebulization BID  . budesonide (PULMICORT) nebulizer solution  0.25 mg Nebulization BID  . clopidogrel  75 mg Oral q morning - 10a  . enoxaparin (LOVENOX) injection  30  mg Subcutaneous Q24H  . feeding supplement (GLUCERNA SHAKE)  237 mL Oral TID BM  . guaiFENesin  Boyer,200 mg Oral Daily  . insulin aspart  0-20 Units Subcutaneous 6 times per day  . levofloxacin (LEVAQUIN) IV  750 mg Intravenous Q48H  . methylPREDNISolone (SOLU-MEDROL) injection  60 mg Intravenous Q12H  . montelukast  10 mg Oral Daily  . pravastatin  80 mg Oral QPM    Time spent on care of this patient: 40 mins   Chiyoko Torrico, Geraldo Docker , MD  [facility] Office  [phone] Pager [phone]  On-Call/Text Page:      [email or web address]      password [email or web address]  If 7PM-7AM, please contact night-coverage [email or web address] Password [email or web address] 12/23/2014,  4:03 PM   LOS: 2 days   Care during the described time interval was provided by me .  I have reviewed this patient's available data, including medical history, events of note, physical examination, and all test results as part of my evaluation. I have personally reviewed and interpreted all radiology studies.   Dia Crawford, MD [phone] Pager

## 2014-12-23 NOTE — Progress Notes (Signed)
ANTIBIOTIC CONSULT NOTE - FOLLOW UP  Pharmacy Consult for levaquin Indication: COPD exacerbation  Allergies  Allergen Reactions  . Doxycycline Anaphylaxis  . Codeine Other (See Comments)    hyperactivity  . Dulera [Mometasone Furo-Formoterol Fum] Hives and Rash  . Adhesive [Tape] Rash  . Latex Other (See Comments)    unknown  . Lisinopril Cough    Patient Measurements: Height: 5\' 1"  (154.9 cm) Weight: 98 lb 8.7 oz (44.7 kg) IBW/kg (Calculated) : 47.8  Vital Signs: Temp: 97.5 F (36.4 C) (11/17 0741) Temp Source: Oral (11/17 0741) BP: 148/48 mmHg (11/17 0741) Pulse Rate: 111 (11/17 0741) Intake/Output from previous day: 11/16 0701 - 11/17 0700 In: 120 [P.O.:120] Out: 1300 [Urine:1300] Intake/Output from this shift: Total I/O In: -  Out: 200 [Urine:200]  Labs:  Recent Labs  12/20/14 1350 12/22/14 0240 12/23/14 0310  WBC 12.4* 17.8* 11.7*  HGB 11.5* 11.7* 11.5*  PLT 340 332 330  CREATININE 0.48 0.52 0.50   Estimated Creatinine Clearance: 46.2 mL/min (by C-G formula based on Cr of 0.5). No results for input(s): VANCOTROUGH, VANCOPEAK, VANCORANDOM, GENTTROUGH, GENTPEAK, GENTRANDOM, TOBRATROUGH, TOBRAPEAK, TOBRARND, AMIKACINPEAK, AMIKACINTROU, AMIKACIN in the last 72 hours.   Microbiology: Recent Results (from the past 720 hour(s))  MRSA PCR Screening     Status: None   Collection Time: 12/21/14  1:34 AM  Result Value Ref Range Status   MRSA by PCR NEGATIVE NEGATIVE Final    Comment:        The GeneXpert MRSA Assay (FDA approved for NASAL specimens only), is one component of a comprehensive MRSA colonization surveillance program. It is not intended to diagnose MRSA infection nor to guide or monitor treatment for MRSA infections.     Anti-infectives    Start     Dose/Rate Route Frequency Ordered Stop   12/21/14 1800  cefTRIAXone (ROCEPHIN) 1 g in dextrose 5 % 50 mL IVPB  Status:  Discontinued     1 g 100 mL/hr over 30 Minutes Intravenous Every 24  hours 12/21/14 1544 12/22/14 1613   12/21/14 1545  azithromycin (ZITHROMAX) 500 mg in dextrose 5 % 250 mL IVPB  Status:  Discontinued     500 mg 250 mL/hr over 60 Minutes Intravenous Every 24 hours 12/21/14 1538 12/21/14 1540   12/20/14 1830  levofloxacin (LEVAQUIN) IVPB 750 mg     750 mg 100 mL/hr over 90 Minutes Intravenous Every 48 hours 12/20/14 1816 12/26/14 1829      Assessment: 69 yo female with COPD exacerbation on levaquin D4. WBC= 11.7 (trend down), afebrile, no cultures available, and CrCl ~ 45-50.  Levaquin 11/14 >> Ceftriaxone 11/15>> 11/16  Plan:  -Continue levaquin 750mg  IV q48hr -Consider stop date of 12/24/2014?  Hildred Laser, Pharm D 12/23/2014 9:45 AM

## 2014-12-23 NOTE — NC FL2 (Signed)
[place] LEVEL OF CARE SCREENING TOOL     IDENTIFICATION  Patient Name: Judith Boyer Birthdate: [birth year] Sex: female Admission Date (Current Location): 12/20/2014  [facility] and [place] Number: Herbalist and Address:  [facility] [place], [place] [address] [place], [place] [address]      Provider Number: [id number]  Attending Physician Name and Address:  Cherene Altes, MD  Relative Name and Phone Number:   Pandora Leiter Danae Tuccillo) [phone])    Current Level of Care: Hospital Recommended Level of Care: [place] Prior Approval Number:    Date Approved/Denied:   PASRR Number:  [id number]  Discharge Plan: SNF    Current Diagnoses: Patient Active Problem List   Diagnosis Date Noted  . Acute on chronic respiratory failure with hypoxia ([place])   . Uncontrolled diabetes mellitus type 2 without complications ([place]) [id number]  . Acute exacerbation of chronic obstructive pulmonary disease (COPD) ([place])   . Diabetes mellitus with complication ([place])   . Chronic respiratory failure with hypercapnia ([place]) 09/24/2014  . Palliative care by specialist   . Dysphagia   . Dyspnea 08/13/2014  . Tachycardia   . Respiratory acidosis   . Coronary artery disease involving native coronary artery of native heart without angina pectoris   . Type 2 diabetes mellitus without complication ([place])   . Anxiety   . COPD with acute exacerbation ([place]) 06/18/2014  . B12 deficiency 02/10/2014  . Protein-calorie malnutrition, severe ([place]) 02/09/2014  . Acute respiratory distress ([facility]) 02/08/2014  . Vitamin B 12 deficiency 06/28/2013  . Palliative care encounter 02/25/2013  . Weakness generalized 02/25/2013  . COPD exacerbation ([place]) 02/20/2013  . CAP (community acquired pneumonia) 02/20/2013  . Anemia 02/20/2013  . Nocturnal hypoxemia 12/30/2012  . Chest pain at rest 02/09/2011  . CAD in native artery 02/09/2011  . COPD GOLD III  02/06/2011  . Smoker 02/06/2011  . Hypertension 02/06/2011  . Diabetes mellitus type 2, controlled, without complications ([place]) [id number]    Orientation ACTIVITIES/SOCIAL BLADDER RESPIRATION    Self, Time, Situation, Place  Passive Continent Normal  BEHAVIORAL SYMPTOMS/MOOD NEUROLOGICAL BOWEL NUTRITION STATUS      Continent Diet (Regular)  PHYSICIAN VISITS COMMUNICATION OF NEEDS Height & Weight Skin  30 days Verbally 5\' 1"  (154.9 cm) 98 lbs. Normal          AMBULATORY STATUS RESPIRATION    Supervision limited Normal      Personal Care Assistance Level of Assistance  Bathing, Feeding, Dressing Bathing Assistance: Limited assistance Feeding assistance: Independent Dressing Assistance: Limited assistance      Functional Limitations Info  Sight, Hearing, Speech Sight Info: Adequate Hearing Info: Adequate Speech Info: Adequate       SPECIAL CARE FACTORS FREQUENCY  PT (By licensed PT), OT (By licensed OT)     PT Frequency: 5x OT Frequency: 5x           Additional Factors Info  Code Status Code Status Info: Partial             Current Medications (12/23/2014): Current Facility-Administered Medications  Medication Dose Route Frequency Provider Last Rate Last Dose  . acetaminophen (TYLENOL) tablet 650 mg  650 mg Oral Q6H PRN Willia Craze, NP       Or  . acetaminophen (TYLENOL) suppository 650 mg  650 mg Rectal Q6H PRN Willia Craze, NP      . ALPRAZolam Duanne Moron) tablet 0.25 mg  0.25 mg Oral TID Allie Bossier,  MD   0.25 mg at 12/23/14 0959  . antiseptic oral rinse (CPC / CETYLPYRIDINIUM CHLORIDE 0.05%) solution 7 mL  7 mL Mouth Rinse BID Willia Craze, NP   7 mL at 12/23/14 1030  . arformoterol (BROVANA) nebulizer solution 15 mcg  15 mcg Nebulization BID Allie Bossier, MD   15 mcg at 12/23/14 [phone]  . benzonatate (TESSALON) capsule 100 mg  100 mg Oral TID PRN Willia Craze, NP   100 mg at 12/23/14 1318  . budesonide (PULMICORT) nebulizer solution  0.25 mg  0.25 mg Nebulization BID Cherene Altes, MD   0.25 mg at 12/23/14 [id number]  . clopidogrel (PLAVIX) tablet 75 mg  75 mg Oral q morning - 10a Willia Craze, NP   75 mg at 12/23/14 0959  . enoxaparin (LOVENOX) injection 30 mg  30 mg Subcutaneous Q24H Lyndee Leo, RPH   30 mg at 12/22/14 2042  . feeding supplement (GLUCERNA SHAKE) (GLUCERNA SHAKE) liquid 237 mL  237 mL Oral TID BM Jenifer A Williams, RD      . guaiFENesin (MUCINEX) 12 hr tablet 1,200 mg  1,200 mg Oral Daily Cherene Altes, MD   1,200 mg at 12/23/14 0959  . insulin aspart (novoLOG) injection 0-20 Units  0-20 Units Subcutaneous 6 times per day Gardiner Barefoot, NP   11 Units at 12/23/14 1205  . ipratropium-albuterol (DUONEB) 0.5-2.5 (3) MG/3ML nebulizer solution 3 mL  3 mL Nebulization Q2H PRN Willia Craze, NP   3 mL at 12/23/14 1348  . levofloxacin (LEVAQUIN) IVPB 750 mg  750 mg Intravenous Q48H Cherene Altes, MD   750 mg at 12/22/14 1725  . methylPREDNISolone sodium succinate (SOLU-MEDROL) 125 mg/2 mL injection 60 mg  60 mg Intravenous Q12H Cherene Altes, MD   60 mg at 12/23/14 0541  . montelukast (SINGULAIR) tablet 10 mg  10 mg Oral Daily Willia Craze, NP   10 mg at 12/23/14 0959  . morphine 2 MG/ML injection 1 mg  1 mg Intravenous Q4H PRN Allie Bossier, MD   1 mg at 12/22/14 2201  . nitroGLYCERIN (NITROSTAT) SL tablet 0.4 mg  0.4 mg Sublingual Q5 Min x 3 PRN Willia Craze, NP      . ondansetron [facility]) tablet 4 mg  4 mg Oral Q6H PRN Willia Craze, NP       Or  . ondansetron [facility]) injection 4 mg  4 mg Intravenous Q6H PRN Willia Craze, NP   4 mg at 12/23/14 1027  . pravastatin (PRAVACHOL) tablet 80 mg  80 mg Oral QPM Willia Craze, NP   80 mg at 12/22/14 1611   Do not use this list as official medication orders. Please verify with discharge summary.  Discharge Medications:   Medication List    ASK your doctor about these medications        albuterol 108 (90 BASE) MCG/ACT  inhaler  Commonly known as:  PROAIR HFA  Inhale 2 puffs into the lungs every 4 (four) hours as needed for wheezing or shortness of breath.     albuterol (2.5 MG/3ML) 0.083% nebulizer solution  Commonly known as:  PROVENTIL  Take 3 mLs (2.5 mg total) by nebulization every 4 (four) hours as needed for wheezing or shortness of breath.     ALPRAZolam 0.25 MG tablet  Commonly known as:  XANAX  Take 1 tablet (0.25 mg total) by mouth 2 (two) times daily.  budesonide-formoterol 160-4.5 MCG/ACT inhaler  Commonly known as:  SYMBICORT  Take 2 puffs first thing in am and then another 2 puffs about 12 hours later.     clopidogrel 75 MG tablet  Commonly known as:  PLAVIX  Take 1 tablet (75 mg total) by mouth every morning.     Iron Tabs  Take 1 tablet by mouth daily.     metFORMIN 500 MG tablet  Commonly known as:  GLUCOPHAGE  Take 2 tablets (1,000 mg total) by mouth 2 (two) times daily with a meal.     montelukast 10 MG tablet  Commonly known as:  SINGULAIR  take 1 tablet by mouth once daily     MUCINEX MAXIMUM STRENGTH 1200 MG Tb12  Generic drug:  Guaifenesin  Take 1,200 mg by mouth 2 (two) times daily.     nitroGLYCERIN 0.4 MG SL tablet  Commonly known as:  NITROSTAT  Place 1 tablet (0.4 mg total) under the tongue every 5 (five) minutes x 3 doses as needed for chest pain.     NOVOLIN N 100 UNIT/ML injection  Generic drug:  insulin NPH Human  Inject 14 Units into the skin daily.     OXYGEN  Inhale 2 L into the lungs continuous.     pravastatin 80 MG tablet  Commonly known as:  PRAVACHOL  Take 1 tablet (80 mg total) by mouth every evening.     predniSONE 10 MG tablet  Commonly known as:  DELTASONE  Take  2 each am until better then 1 each am     Tiotropium Bromide Monohydrate 2.5 MCG/ACT Aers  Commonly known as:  SPIRIVA RESPIMAT  2 puffs each am     valsartan 80 MG tablet  Commonly known as:  DIOVAN  Take 1 tablet (80 mg total) by mouth daily.        Relevant  Imaging Results:  Relevant Lab Results:  Recent Labs    Additional Information SS# [id number]  Minta Balsam  BSW intern  [phone]

## 2014-12-23 NOTE — Progress Notes (Signed)
Nutrition Follow-up  DOCUMENTATION CODES:   Severe malnutrition in context of chronic illness  INTERVENTION:   -Glucerna Shake po TID, each supplement provides 220 kcal and 10 grams of protein  NUTRITION DIAGNOSIS:   Inadequate oral intake related to poor appetite as evidenced by meal completion < 50%.  Ongoing  GOAL:   Patient will meet greater than or equal to 90% of their needs  Unmet  MONITOR:   PO intake, Supplement acceptance, Labs, Weight trends, Skin, I & O's  REASON FOR ASSESSMENT:   Consult Assessment of nutrition requirement/status  ASSESSMENT:   Judith Boyer is a 69 y.o. female with a Past Medical History of COPD, HTN, CAD, HLD, MI, DM, anemia who presents with COPD exacerbation.   Hx obtained by pt at bedside. She reports a chronically poor appetite. Meal completion 25%. She reports that she consumed 3 meals PTA, however, meal consisted of simple foods, such as sandwiches and spaghetti.   Pt reports UBW of around 100#. She estimates she has lost 3#, but is unable to quantify time frame for weight loss. However, not weight stability over the past year.   Pt expressed concern over her elevated blood sugar as a result of steroid use. Per DM coordinator note on 12/21/14, pt was receiving insulin PTA (NPH 14 units daily). She is currently receiving resistant correction q 4 hours. She remains on IV solumedrol.   Discussed importance of good nutritional intake to promote healing. Pt reveals that she consumes Glucerna at home. Pt amenable to consume in hospital, due to comparably reduced carbohydrate content vs Boost and Ensure.   Nutrition-Focused physical exam completed. Findings are moderate to severe fat depletion, moderate to severe muscle depletion, and no edema.   Labs reviewed: CBGS: 138-266.   Diet Order:  Diet Carb Modified Fluid consistency:: Thin; Room service appropriate?: Yes  Skin:  Reviewed, no issues  Last BM:  12/20/14  Height:    Ht Readings from Last 1 Encounters:  12/21/14 5\' 1"  (1.549 m)    Weight:   Wt Readings from Last 1 Encounters:  12/21/14 98 lb 8.7 oz (44.7 kg)    Ideal Body Weight:  47.7 kg  BMI:  Body mass index is 18.63 kg/(m^2).  Estimated Nutritional Needs:   Kcal:  1300-1500  Protein:  55-65 grams  Fluid:  1.3-1.5 L  EDUCATION NEEDS:   Education needs addressed  Judith Boyer A. Jimmye Norman, RD, LDN, CDE Pager: [phone] After hours Pager: [phone]

## 2014-12-24 LAB — CBC WITH DIFFERENTIAL/PLATELET
BASOS ABS: 0 10*3/uL (ref 0.0–0.1)
BASOS PCT: 0 %
EOS ABS: 0 10*3/uL (ref 0.0–0.7)
Eosinophils Relative: 0 %
HCT: 36.5 % (ref 36.0–46.0)
HEMOGLOBIN: 11 g/dL — AB (ref 12.0–15.0)
Lymphocytes Relative: 4 %
Lymphs Abs: 0.6 10*3/uL — ABNORMAL LOW (ref 0.7–4.0)
MCH: 28.4 pg (ref 26.0–34.0)
MCHC: 30.1 g/dL (ref 30.0–36.0)
MCV: 94.1 fL (ref 78.0–100.0)
Monocytes Absolute: 0.7 10*3/uL (ref 0.1–1.0)
Monocytes Relative: 4 %
NEUTROS PCT: 92 %
Neutro Abs: 14.2 10*3/uL — ABNORMAL HIGH (ref 1.7–7.7)
Platelets: 383 10*3/uL (ref 150–400)
RBC: 3.88 MIL/uL (ref 3.87–5.11)
RDW: 13.1 % (ref 11.5–15.5)
WBC: 15.5 10*3/uL — AB (ref 4.0–10.5)

## 2014-12-24 LAB — COMPREHENSIVE METABOLIC PANEL
ALK PHOS: 49 U/L (ref 38–126)
ALT: 23 U/L (ref 14–54)
AST: 20 U/L (ref 15–41)
Albumin: 3 g/dL — ABNORMAL LOW (ref 3.5–5.0)
Anion gap: 4 — ABNORMAL LOW (ref 5–15)
BUN: 22 mg/dL — ABNORMAL HIGH (ref 6–20)
CALCIUM: 8.8 mg/dL — AB (ref 8.9–10.3)
CO2: 38 mmol/L — ABNORMAL HIGH (ref 22–32)
CREATININE: 0.55 mg/dL (ref 0.44–1.00)
Chloride: 99 mmol/L — ABNORMAL LOW (ref 101–111)
Glucose, Bld: 200 mg/dL — ABNORMAL HIGH (ref 65–99)
Potassium: 4.7 mmol/L (ref 3.5–5.1)
Sodium: 141 mmol/L (ref 135–145)
TOTAL PROTEIN: 5.7 g/dL — AB (ref 6.5–8.1)
Total Bilirubin: 0.1 mg/dL — ABNORMAL LOW (ref 0.3–1.2)

## 2014-12-24 LAB — GLUCOSE, CAPILLARY
GLUCOSE-CAPILLARY: 177 mg/dL — AB (ref 65–99)
Glucose-Capillary: 174 mg/dL — ABNORMAL HIGH (ref 65–99)
Glucose-Capillary: 124 mg/dL — ABNORMAL HIGH (ref 65–99)
Glucose-Capillary: 310 mg/dL — ABNORMAL HIGH (ref 65–99)
Glucose-Capillary: 239 mg/dL — ABNORMAL HIGH (ref 65–99)

## 2014-12-24 LAB — MAGNESIUM: MAGNESIUM: 2.2 mg/dL (ref 1.7–2.4)

## 2014-12-24 MED ORDER — INSULIN ASPART 100 UNIT/ML ~~LOC~~ SOLN
6.0000 [IU] | Freq: Three times a day (TID) | SUBCUTANEOUS | Status: DC
  Administered 2014-12-25 – 2014-12-27 (×7): 6 [IU] via SUBCUTANEOUS

## 2014-12-24 MED ORDER — LORAZEPAM 2 MG/ML IJ SOLN
INTRAMUSCULAR | Status: AC
  Administered 2014-12-24: 1 mg
  Filled 2014-12-24: qty 1

## 2014-12-24 MED ORDER — INSULIN ASPART 100 UNIT/ML ~~LOC~~ SOLN
0.0000 [IU] | Freq: Three times a day (TID) | SUBCUTANEOUS | Status: DC
  Administered 2014-12-25: 4 [IU] via SUBCUTANEOUS
  Administered 2014-12-25 – 2014-12-26 (×4): 11 [IU] via SUBCUTANEOUS
  Administered 2014-12-26: 4 [IU] via SUBCUTANEOUS
  Administered 2014-12-27: 3 [IU] via SUBCUTANEOUS
  Administered 2014-12-27: 4 [IU] via SUBCUTANEOUS
  Administered 2014-12-27: 7 [IU] via SUBCUTANEOUS
  Administered 2014-12-28: 4 [IU] via SUBCUTANEOUS
  Administered 2014-12-28: 11 [IU] via SUBCUTANEOUS
  Administered 2014-12-29: 7 [IU] via SUBCUTANEOUS
  Administered 2014-12-29: 4 [IU] via SUBCUTANEOUS
  Administered 2014-12-29: 3 [IU] via SUBCUTANEOUS
  Administered 2014-12-30: 4 [IU] via SUBCUTANEOUS
  Administered 2014-12-30: 11 [IU] via SUBCUTANEOUS
  Administered 2014-12-31: 4 [IU] via SUBCUTANEOUS
  Administered 2014-12-31: 7 [IU] via SUBCUTANEOUS

## 2014-12-24 MED ORDER — LORAZEPAM 2 MG/ML IJ SOLN
1.0000 mg | INTRAMUSCULAR | Status: DC | PRN
  Administered 2014-12-27 – 2014-12-29 (×2): 1 mg via INTRAVENOUS
  Filled 2014-12-24 (×2): qty 1

## 2014-12-24 MED ORDER — INSULIN ASPART 100 UNIT/ML ~~LOC~~ SOLN
0.0000 [IU] | Freq: Every day | SUBCUTANEOUS | Status: DC
  Administered 2014-12-24 – 2014-12-30 (×3): 3 [IU] via SUBCUTANEOUS

## 2014-12-24 NOTE — Progress Notes (Signed)
Progress Note   Judith Boyer [id number] DOB: [birth year] DOA: 15-Jan-2015 PCP: Jenny Reichmann, MD  Pulmonologist: Dr. Melvyn Novas   Brief Narrative:   Judith Boyer is an 69 y.o. female with a PMH of chronic respiratory failure on 2 L of home oxygen, COPD, hypertension, CAD, hyperlipidemia and type 2 diabetes who was admitted Jan 15, 2015 with acute on chronic respiratory failure.  Assessment/Plan:   COPD exacerbation ([place])./Acute on chronic respiratory failure with hypoxia - Continue nebulizers, IV Solu-Medrol, Mucinex, Brovana BID, Pulmicort BID and DuoNebs PRN. - Continue  Physiotherapy Vest BID. - Continue Oxygen therapy.at 3L; titrate to maintain SPO2 89-93%. - Continue Xanax TID daily -helps breathing. - Continue Morphine 1 mg PRN feeling SOB. - PT/OT; recommends SNF.  Sinus Tachycardia/HTN - Continue Cardizam 30 mg TID. Heart rate in the 90s.  Noncompliance medication - See history of present illness.  Uncontrolled diabetes mellitus type 2 uncontrolled - Hemoglobin A1c= 8.3%. - Monitor CBGs closely on Solu-Medrol. - Currently being managed with insulin resistant SSI every 4 hours. CBGs 124-241. - Diet advanced to carb modified, will change SSI to Q AC/HS and add 6 units of meal coverage.  CAD, s/p stenting 2009.  - On chronic Plavix. Echocardiogram January 2015. LDL ejection fraction 55-60%.  Tobacco abuse - Tobacco cessation counseling provided.  DVT Prophylaxis - Lovenox ordered.   Family Communication/Anticipated D/C date and plan/Code Status   Family Communication: Neice updated at the bedside. Disposition Plan: SNF recommended by PT. Anticipated D/C date:   2-3 days if respiratory status improved. Code Status:     Code Status Orders        Start     Ordered   12/22/14 1749  Limited resuscitation (code)   Continuous    Question Answer Comment  In the event of cardiac or respiratory ARREST: Initiate Code Blue, Call Rapid Response Yes     In the event of cardiac or respiratory ARREST: Perform CPR Yes   In the event of cardiac or respiratory ARREST: Perform Intubation/Mechanical Ventilation No   In the event of cardiac or respiratory ARREST: Use NIPPV/BiPAp only if indicated Yes   In the event of cardiac or respiratory ARREST: Administer ACLS medications if indicated Yes   In the event of cardiac or respiratory ARREST: Perform Defibrillation or Cardioversion if indicated Yes      12/22/14 1748       IV Access:    Peripheral IV   Procedures and diagnostic studies:   Dg Chest Port 1 View  January 15, 2015  CLINICAL DATA:  Shortness of breath and cough for 3 days EXAM: PORTABLE CHEST 1 VIEW COMPARISON:  09/09/2014 FINDINGS: The heart size and mediastinal contours are within normal limits. Both lungs are clear. The visualized skeletal structures are unremarkable. IMPRESSION: No active disease. Electronically Signed   By: Kerby Moors M.D.   On: 01-15-2015 13:21     Medical Consultants:    None.  Anti-Infectives:   Anti-infectives    Start     Dose/Rate Route Frequency Ordered Stop   12/21/14 1800  cefTRIAXone (ROCEPHIN) 1 g in dextrose 5 % 50 mL IVPB  Status:  Discontinued     1 g 100 mL/hr over 30 Minutes Intravenous Every 24 hours 12/21/14 1544 12/22/14 1613   12/21/14 1545  azithromycin (ZITHROMAX) 500 mg in dextrose 5 % 250 mL IVPB  Status:  Discontinued     500 mg 250 mL/hr over 60 Minutes Intravenous Every 24 hours 12/21/14 1538 12/21/14  1540   12/20/14 1830  levofloxacin (LEVAQUIN) IVPB 750 mg     750 mg 100 mL/hr over 90 Minutes Intravenous Every 48 hours 12/20/14 1816 12/26/14 1829      Subjective:    Judith Boyer continues to feel short of breath, mostly with a nonproductive cough, weak with a poor appetite. No specific chest pains. No nausea or vomiting.  Objective:    Filed Vitals:   12/24/14 0706 12/24/14 0720 12/24/14 0745 12/24/14 0800  BP: 140/61  139/63 144/65  Pulse:   91 93   Temp:   97.8 F (36.6 C)   TempSrc:   Oral   Resp:   26 17  Height:      Weight:      SpO2:  100% 100% 100%    Intake/Output Summary (Last 24 hours) at 12/24/14 1014 Last data filed at 12/23/14 2150  Gross per 24 hour  Intake    200 ml  Output    300 ml  Net   -100 ml   Filed Weights   12/20/14 1301 12/20/14 1835 12/21/14 0137  Weight: 43.999 kg (97 lb) 43.8 kg (96 lb 9 oz) 44.7 kg (98 lb 8.7 oz)    Exam: Gen:  NAD Cardiovascular:  Mildly tachycardia, No M/R/G Respiratory:  Lungs with rhonchi and wheezes Gastrointestinal:  Abdomen soft, NT/ND, + BS Extremities:  2+ edema   Data Reviewed:    Labs: Basic Metabolic Panel:  Recent Labs Lab 12/20/14 1350 12/22/14 0240 12/23/14 0310 12/24/14 0410  NA 140 139 140 141  K 3.8 4.1 4.4 4.7  CL 98* 95* 98* 99*  CO2 32 33* 35* 38*  GLUCOSE 176* 161* 139* 200*  BUN 15 24* 20 22*  CREATININE 0.48 0.52 0.50 0.55  CALCIUM 9.4 8.8* 8.7* 8.8*  MG  --  2.1 2.2 2.2   GFR Estimated Creatinine Clearance: 46.2 mL/min (by C-G formula based on Cr of 0.55). Liver Function Tests:  Recent Labs Lab 12/22/14 0240 12/23/14 0310 12/24/14 0410  AST 18 18 20   ALT 19 20 23   ALKPHOS 48 49 49  BILITOT 0.4 0.2* 0.1*  PROT 5.4* 5.8* 5.7*  ALBUMIN 3.1* 3.0* 3.0*   Coagulation profile  Recent Labs Lab 12/20/14 1910  INR 1.05    CBC:  Recent Labs Lab 12/20/14 1350 12/22/14 0240 12/23/14 0310 12/24/14 0410  WBC 12.4* 17.8* 11.7* 15.5*  NEUTROABS 11.3* 16.8* 10.9* 14.2*  HGB 11.5* 11.7* 11.5* 11.0*  HCT 37.6 38.0 37.0 36.5  MCV 93.3 93.1 93.4 94.1  PLT 340 332 330 383   CBG:  Recent Labs Lab 12/23/14 1630 12/23/14 2001 12/24/14 0007 12/24/14 0426 12/24/14 0757  GLUCAP 241* 201* 177* 174* 124*   Sepsis Labs:  Recent Labs Lab 12/20/14 1350 12/22/14 0240 12/23/14 0310 12/24/14 0410  WBC 12.4* 17.8* 11.7* 15.5*  LATICACIDVEN  --  0.9  --   --    Microbiology Recent Results (from the past 240 hour(s))   MRSA PCR Screening     Status: None   Collection Time: 12/21/14  1:34 AM  Result Value Ref Range Status   MRSA by PCR NEGATIVE NEGATIVE Final    Comment:        The GeneXpert MRSA Assay (FDA approved for NASAL specimens only), is one component of a comprehensive MRSA colonization surveillance program. It is not intended to diagnose MRSA infection nor to guide or monitor treatment for MRSA infections.      Medications:   . ALPRAZolam  0.25 mg Oral TID  . antiseptic oral rinse  7 mL Mouth Rinse BID  . arformoterol  15 mcg Nebulization BID  . budesonide (PULMICORT) nebulizer solution  0.25 mg Nebulization BID  . clopidogrel  75 mg Oral q morning - 10a  . diltiazem  30 mg Oral 3 times per day  . enoxaparin (LOVENOX) injection  30 mg Subcutaneous Q24H  . feeding supplement (GLUCERNA SHAKE)  237 mL Oral TID BM  . guaiFENesin  1,200 mg Oral Daily  . insulin aspart  0-20 Units Subcutaneous 6 times per day  . levofloxacin (LEVAQUIN) IV  750 mg Intravenous Q48H  . methylPREDNISolone (SOLU-MEDROL) injection  60 mg Intravenous Q12H  . montelukast  10 mg Oral Daily  . pravastatin  80 mg Oral QPM   Continuous Infusions:   Time spent: 35 minutes.  The patient is medically complex with multiple co-morbidities and is at high risk for clinical deterioration and requires high complexity decision making.     LOS: 3 days   [facility] Pager [phone]. If unable to reach me by pager, please call my cell phone at [phone].  *Please refer to [email or web address], password [email or web address] to get updated schedule on who will round on this patient, as hospitalists switch teams weekly. If 7PM-7AM, please contact night-coverage at [email or web address], password [email or web address] for any overnight needs.  12/24/2014, 10:14 AM

## 2014-12-24 NOTE — Progress Notes (Signed)
PT Cancellation Note  Patient Details Name: Judith Boyer MRN: [id number] DOB: 01/26/1946   Cancelled Treatment:    Reason Eval/Treat Not Completed: Patient not medically ready Holding PT tx this A M per request of RN as pt reports not feeling well and very anxious. RN to get respiratory to come give treatment. Will follow up next available time.   [name] A Merit Maybee 12/24/2014, 10:33 AM Wray Kearns, PT, DPT [phone]

## 2014-12-24 NOTE — Progress Notes (Signed)
1115.-  Pt. Up to bsc x2 and c/o sob. Returned back to bed, pt anxious --HR 135, RR 35. Nebulizer Tx given per RT Pt  With increased anxiety, stating sob worsening and requested Bipap and Ativan. RT notified and Bipap applied.  Dr. Rockne Menghini called --order given for Ativan and 1 mg administered.

## 2014-12-24 NOTE — Progress Notes (Signed)
Called to room by RN due to increased WOB, dec spo2, pt placed back on Bipap at this time and is tolerating well, RT will monitor, spo2 96%, WOB improved.

## 2014-12-24 NOTE — Progress Notes (Signed)
Inpatient Diabetes Program Recommendations  AACE/ADA: New Consensus Statement on Inpatient Glycemic Control (2015)  Target Ranges:  Prepandial:   less than 140 mg/dL      Peak postprandial:   less than 180 mg/dL (1-2 hours)      Critically ill patients:  140 - 180 mg/dL   Review of Glycemic Control  Inpatient Diabetes Program Recommendations:  Insulin - Basal: consider adding NPH 7 units BID  Thank you  Raoul Pitch BSN, RN,CDE Inpatient Diabetes Coordinator [phone] (team pager)

## 2014-12-24 NOTE — Progress Notes (Signed)
PT Cancellation Note  Patient Details Name: Judith Boyer MRN: [id number] DOB: [birth year]   Cancelled Treatment:    Reason Eval/Treat Not Completed: Other (comment)  RN requests not to wake pt up to perform PT tx at this time as she just fell asleep. Will follow up next available time.  [name] A Shakoya Gilmore 12/24/2014, 2:36 PM Wray Kearns, [place], DPT [phone]

## 2014-12-24 NOTE — Care Management Important Message (Signed)
Important Message  Patient Details  Name: Judith Boyer MRN: [id number] Date of Birth: 05-23-1945   Medicare Important Message Given:  Yes    Vicy Medico P Doy Taaffe 12/24/2014, 1:06 PM

## 2014-12-25 ENCOUNTER — Inpatient Hospital Stay (HOSPITAL_COMMUNITY): Payer: Medicare Other

## 2014-12-25 DIAGNOSIS — Z794 Long term (current) use of insulin: Secondary | ICD-10-CM

## 2014-12-25 DIAGNOSIS — J441 Chronic obstructive pulmonary disease with (acute) exacerbation: Principal | ICD-10-CM

## 2014-12-25 DIAGNOSIS — J9621 Acute and chronic respiratory failure with hypoxia: Secondary | ICD-10-CM

## 2014-12-25 DIAGNOSIS — R531 Weakness: Secondary | ICD-10-CM

## 2014-12-25 DIAGNOSIS — R0902 Hypoxemia: Secondary | ICD-10-CM

## 2014-12-25 DIAGNOSIS — E875 Hyperkalemia: Secondary | ICD-10-CM | POA: Diagnosis present

## 2014-12-25 DIAGNOSIS — J9620 Acute and chronic respiratory failure, unspecified whether with hypoxia or hypercapnia: Secondary | ICD-10-CM

## 2014-12-25 LAB — GLUCOSE, CAPILLARY
GLUCOSE-CAPILLARY: 273 mg/dL — AB (ref 65–99)
GLUCOSE-CAPILLARY: 280 mg/dL — AB (ref 65–99)
Glucose-Capillary: 276 mg/dL — ABNORMAL HIGH (ref 65–99)
Glucose-Capillary: 199 mg/dL — ABNORMAL HIGH (ref 65–99)

## 2014-12-25 LAB — POCT I-STAT 3, ART BLOOD GAS (G3+)
Acid-Base Excess: 9 mmol/L — ABNORMAL HIGH (ref 0.0–2.0)
BICARBONATE: 36.2 meq/L — AB (ref 20.0–24.0)
O2 Saturation: 94 %
PCO2 ART: 59.7 mmHg — AB (ref 35.0–45.0)
Patient temperature: 98.6
TCO2: 38 mmol/L (ref 0–100)
pH, Arterial: 7.391 (ref 7.350–7.450)
pO2, Arterial: 74 mmHg — ABNORMAL LOW (ref 80.0–100.0)

## 2014-12-25 LAB — BRAIN NATRIURETIC PEPTIDE: B Natriuretic Peptide: 45.6 pg/mL (ref 0.0–100.0)

## 2014-12-25 LAB — BASIC METABOLIC PANEL
Anion gap: 9 (ref 5–15)
BUN: 22 mg/dL — ABNORMAL HIGH (ref 6–20)
CALCIUM: 9 mg/dL (ref 8.9–10.3)
CO2: 32 mmol/L (ref 22–32)
CREATININE: 0.53 mg/dL (ref 0.44–1.00)
Chloride: 96 mmol/L — ABNORMAL LOW (ref 101–111)
GFR calc non Af Amer: >60 mL/min (ref >60)
GLUCOSE: 261 mg/dL — AB (ref 65–99)
Potassium: 5.2 mmol/L — ABNORMAL HIGH (ref 3.5–5.1)
Sodium: 137 mmol/L (ref 135–145)

## 2014-12-25 LAB — CBC
HCT: 36.9 % (ref 36.0–46.0)
Hemoglobin: 11.2 g/dL — ABNORMAL LOW (ref 12.0–15.0)
MCH: 28.5 pg (ref 26.0–34.0)
MCHC: 30.4 g/dL (ref 30.0–36.0)
MCV: 93.9 fL (ref 78.0–100.0)
PLATELETS: 358 10*3/uL (ref 150–400)
RBC: 3.93 MIL/uL (ref 3.87–5.11)
RDW: 13 % (ref 11.5–15.5)
WBC: 10.6 10*3/uL — ABNORMAL HIGH (ref 4.0–10.5)

## 2014-12-25 LAB — D-DIMER, QUANTITATIVE: D-Dimer, Quant: <0.27 ug/mL-FEU (ref 0.00–0.50)

## 2014-12-25 MED ORDER — ALBUTEROL SULFATE (2.5 MG/3ML) 0.083% IN NEBU
2.5000 mg | INHALATION_SOLUTION | RESPIRATORY_TRACT | Status: DC | PRN
  Administered 2014-12-25 – 2014-12-27 (×3): 2.5 mg via RESPIRATORY_TRACT
  Filled 2014-12-25 (×3): qty 3

## 2014-12-25 MED ORDER — ALBUTEROL SULFATE (2.5 MG/3ML) 0.083% IN NEBU
INHALATION_SOLUTION | RESPIRATORY_TRACT | Status: AC
  Administered 2014-12-25: 2.5 mg
  Filled 2014-12-25: qty 3

## 2014-12-25 MED ORDER — IPRATROPIUM BROMIDE 0.02 % IN SOLN
0.5000 mg | Freq: Four times a day (QID) | RESPIRATORY_TRACT | Status: DC
  Administered 2014-12-25: 0.5 mg via RESPIRATORY_TRACT
  Filled 2014-12-25: qty 2.5

## 2014-12-25 MED ORDER — BUDESONIDE 0.5 MG/2ML IN SUSP
0.5000 mg | Freq: Two times a day (BID) | RESPIRATORY_TRACT | Status: DC
Start: 2014-12-25 — End: 2014-12-31
  Administered 2014-12-25 – 2014-12-31 (×12): 0.5 mg via RESPIRATORY_TRACT
  Filled 2014-12-25 (×12): qty 2

## 2014-12-25 MED ORDER — SODIUM POLYSTYRENE SULFONATE 15 GM/60ML PO SUSP
15.0000 g | Freq: Once | ORAL | Status: AC
  Administered 2014-12-25: 15 g via ORAL
  Filled 2014-12-25: qty 60

## 2014-12-25 MED ORDER — MORPHINE SULFATE (PF) 2 MG/ML IV SOLN
1.0000 mg | INTRAVENOUS | Status: DC | PRN
  Administered 2014-12-25 – 2014-12-26 (×2): 2 mg via INTRAVENOUS
  Filled 2014-12-25 (×2): qty 1

## 2014-12-25 MED ORDER — TIOTROPIUM BROMIDE MONOHYDRATE 18 MCG IN CAPS
18.0000 ug | ORAL_CAPSULE | Freq: Every day | RESPIRATORY_TRACT | Status: DC
  Administered 2014-12-26 – 2014-12-27 (×2): 18 ug via RESPIRATORY_TRACT
  Filled 2014-12-25: qty 5

## 2014-12-25 MED ORDER — DOCUSATE SODIUM 100 MG PO CAPS
100.0000 mg | ORAL_CAPSULE | Freq: Every day | ORAL | Status: DC
  Administered 2014-12-25 – 2014-12-27 (×3): 100 mg via ORAL
  Filled 2014-12-25 (×3): qty 1

## 2014-12-25 MED ORDER — INSULIN NPH (HUMAN) (ISOPHANE) 100 UNIT/ML ~~LOC~~ SUSP
7.0000 [IU] | Freq: Two times a day (BID) | SUBCUTANEOUS | Status: DC
  Administered 2014-12-25 – 2014-12-26 (×3): 7 [IU] via SUBCUTANEOUS
  Filled 2014-12-25: qty 10

## 2014-12-25 MED ORDER — METHYLPREDNISOLONE SODIUM SUCC 125 MG IJ SOLR
60.0000 mg | Freq: Two times a day (BID) | INTRAMUSCULAR | Status: DC
  Administered 2014-12-25: 60 mg via INTRAVENOUS

## 2014-12-25 NOTE — Consult Note (Signed)
PULMONARY / CRITICAL CARE MEDICINE   Name: Judith Boyer MRN: [id number] DOB: August 22, 1945    ADMISSION DATE:  12/20/2014 CONSULTATION DATE:  12/25/14  REFERRING MD :  [facility]/Rizwan   CHIEF COMPLAINT:  COPD exacerbation   HISTORY OF PRESENT ILLNESS:   69 yo female former smoker , Dr. Melvyn Novas  Office patient with severe COPD GOLD III  ( FEV1 33%) admitted 11/14 with COPD exacerbation . Pt says she developed worsening dyspnea and cough with thick mucus around 1 week ago, had to call EMS on 12/20/14 due to severe dyspnea. ABG showed hypercarbia with PCO2 at 60.  She was started on IV abx, IV steroids and Nebs w/ aggressive pulmonary hygiene.   She was started on BIPAP which she feels does help some. She has had a very slow response .  PCCM consulted 11/19 . She has had recent hospitalizaiton in August with similar presentation .  She has been on chronic steroids since August. PTA on prednisone 20mg  daily .  She is a limited code with no intubation    PAST MEDICAL HISTORY :  She  has a past medical history of COPD (chronic obstructive pulmonary disease) ([place]); Hypertension; Coronary artery disease; Shortness of breath; High cholesterol; Myocardial infarction [facility]) (02/2011); On home oxygen therapy; Pneumonia; Type II diabetes mellitus ([place]); Anemia; and History of blood transfusion (2014).  PAST SURGICAL HISTORY: She  has past surgical history that includes Shoulder arthroscopy w/ rotator cuff repair (Left); left heart catheterization with coronary angiogram (N/A, 02/09/2011); Tonsillectomy; Abdominal hysterectomy (1970's); Coronary angioplasty with stent (12/2007); and Cardiac catheterization (02/2011).  Allergies  Allergen Reactions  . Doxycycline Anaphylaxis  . Codeine Other (See Comments)    hyperactivity  . Dulera [Mometasone Furo-Formoterol Fum] Hives and Rash  . Adhesive [Tape] Rash  . Latex Other (See Comments)    unknown  . Lisinopril Cough    No current facility-administered  medications on file prior to encounter.   Current Outpatient Prescriptions on File Prior to Encounter  Medication Sig  . albuterol (PROAIR HFA) 108 (90 BASE) MCG/ACT inhaler Inhale 2 puffs into the lungs every 4 (four) hours as needed for wheezing or shortness of breath.  Marland Kitchen albuterol (PROVENTIL) (2.5 MG/3ML) 0.083% nebulizer solution Take 3 mLs (2.5 mg total) by nebulization every 4 (four) hours as needed for wheezing or shortness of breath.  . ALPRAZolam (XANAX) 0.25 MG tablet Take 1 tablet (0.25 mg total) by mouth 2 (two) times daily.  . budesonide-formoterol (SYMBICORT) 160-4.5 MCG/ACT inhaler Take 2 puffs first thing in am and then another 2 puffs about 12 hours later.  . clopidogrel (PLAVIX) 75 MG tablet Take 1 tablet (75 mg total) by mouth every morning.  . insulin NPH Human (NOVOLIN N) 100 UNIT/ML injection Inject 14 Units into the skin daily.   . Iron TABS Take 1 tablet by mouth daily.  . metFORMIN (GLUCOPHAGE) 500 MG tablet Take 2 tablets (1,000 mg total) by mouth 2 (two) times daily with a meal.  . montelukast (SINGULAIR) 10 MG tablet take 1 tablet by mouth once daily (Patient taking differently: take 10 mg by mouth once every evening)  . nitroGLYCERIN (NITROSTAT) 0.4 MG SL tablet Place 1 tablet (0.4 mg total) under the tongue every 5 (five) minutes x 3 doses as needed for chest pain.  . pravastatin (PRAVACHOL) 80 MG tablet Take 1 tablet (80 mg total) by mouth every evening.  . predniSONE (DELTASONE) 10 MG tablet Take  2 each am until better then 1 each  am (Patient taking differently: Take 20 mg by mouth daily with breakfast. )  . Tiotropium Bromide Monohydrate (SPIRIVA RESPIMAT) 2.5 MCG/ACT AERS 2 puffs each am (Patient taking differently: Inhale 2 puffs into the lungs every morning. 2 puffs each am)  . valsartan (DIOVAN) 80 MG tablet Take 1 tablet (80 mg total) by mouth daily. (Patient taking differently: Take 80 mg by mouth every evening. )    FAMILY HISTORY:  Her indicated that her  mother is deceased. She indicated that her father is deceased. She indicated that her sister is deceased. She indicated that her brother is deceased. She indicated that her maternal grandmother is deceased. She indicated that her maternal grandfather is deceased. She indicated that her paternal grandmother is deceased. She indicated that her paternal grandfather is deceased. She indicated that her son is alive.   SOCIAL HISTORY: She  reports that she quit smoking about 1 years ago. Her smoking use included Cigarettes. She has a 50 pack-year smoking history. She has never used smokeless tobacco. She reports that she does not drink alcohol or use illicit drugs.  REVIEW OF SYSTEMS:   Constitutional:   No  weight loss, night sweats,  Fevers, chills, + fatigue, or  lassitude.  HEENT:   No headaches,  Difficulty swallowing,  Tooth/dental problems, or  Sore throat,                No sneezing, itching, ear ache,  +nasal congestion, post nasal drip,   CV:  No chest pain,  Orthopnea, PND, swelling in lower extremities, anasarca, dizziness, palpitations, syncope.   GI  No heartburn, indigestion, abdominal pain, nausea, vomiting, diarrhea, change in bowel habits, loss of appetite, bloody stools.   Resp:++shortness of breath with exertion or at rest.  ++excess mucus,  productive cough,    Skin: no rash or lesions.  GU: no dysuria, change in color of urine, no urgency or frequency.  No flank pain, no hematuria   MS:  No joint pain or swelling.  No decreased range of motion.  No back pain.  Psych:  No change in mood or affect. No depression or anxiety.  No memory loss.       SUBJECTIVE:  Anxious and says she remains very sob , sitting up in bed.   VITAL SIGNS: BP 169/66 mmHg  Pulse 106  Temp(Src) 98 F (36.7 C) (Oral)  Resp 35  Ht 5\' 1"  (1.549 m)  Wt 44.7 kg (98 lb 8.7 oz)  BMI 18.63 kg/m2  SpO2 97%  HEMODYNAMICS:    VENTILATOR SETTINGS: Vent Mode:  [-]  FiO2 (%):  [32 %] 32  %  INTAKE / OUTPUT: I/O last 3 completed shifts: In: 1290 [P.O.:840; IV Piggyback:450] Out: 2175 [Urine:2175]  PHYSICAL EXAMINATION:  GEN: A/Ox3;  Frail and anxious   HEENT:  [place]/AT,   Dry mucosa   NECK:  Supple w/ fair ROM; no JVD;    RESP  Decreased BS in bases +accessory muscle use, no dullness to percussion  CARD:  RRR, no m/r/g  , no peripheral edema, pulses intact, no cyanosis or clubbing.  GI:   Soft & nt; nml bowel sounds; no organomegaly or masses detected.  Musco: Warm bil, no deformities or joint swelling noted.   Neuro: alert, no focal deficits noted.  , anxious   Skin: Warm, no lesions or rashes   LABS:  CBC  Recent Labs Lab 12/23/14 0310 12/24/14 0410 12/25/14 0254  WBC 11.7* 15.5* 10.6*  HGB 11.5* 11.0* 11.2*  HCT 37.0 36.5 36.9  PLT 330 383 358   Coag's  Recent Labs Lab 12/20/14 1910  APTT 24  INR 1.05   BMET  Recent Labs Lab 12/23/14 0310 12/24/14 0410 12/25/14 0254  NA 140 141 137  K 4.4 4.7 5.2*  CL 98* 99* 96*  CO2 35* 38* 32  BUN 20 22* 22*  CREATININE 0.50 0.55 0.53  GLUCOSE 139* 200* 261*   Electrolytes  Recent Labs Lab 12/22/14 0240 12/23/14 0310 12/24/14 0410 12/25/14 0254  CALCIUM 8.8* 8.7* 8.8* 9.0  MG 2.1 2.2 2.2  --    Sepsis Markers  Recent Labs Lab 12/22/14 0240  LATICACIDVEN 0.9   ABG  Recent Labs Lab 12/21/14 0553 12/21/14 2124 12/22/14 0346  PHART 7.379 7.382 7.384  PCO2ART 62.0* 61.0* 55.8*  PO2ART 71.0* 85.0 81.0   Liver Enzymes  Recent Labs Lab 12/22/14 0240 12/23/14 0310 12/24/14 0410  AST 18 18 20   ALT 19 20 23   ALKPHOS 48 49 49  BILITOT 0.4 0.2* 0.1*  ALBUMIN 3.1* 3.0* 3.0*   Cardiac Enzymes No results for input(s): TROPONINI, PROBNP in the last 168 hours. Glucose  Recent Labs Lab 12/24/14 0426 12/24/14 0757 12/24/14 1210 12/24/14 1629 12/24/14 2143 12/25/14 0801  GLUCAP 174* 124* 310* 239* 273* 280*    Imaging No results found.   STUDIES:     CULTURES:   ANTIBIOTICS: Levaquin 11/14 x 1 dose   SIGNIFICANT EVENTS:   LINES/TUBES:   DISCUSSION: No family at bedside   ASSESSMENT / PLAN:  PULMONARY A: Acute hypoxic/hypercarbic resp failure  COPD exacerbation -chronic steroids pred 10- 20mg  daily   P:   Check CXR today  Check ABG today  BIPAP  Support-would utilize this as pt is able to tolerate  Cont Brovana/Bud neb .Twice daily   Change Atrovent Neb Four times a day   Albuterol As needed   Continue with Flutter /IS  O2 to keep sat >88-90% Check BNP    CARDIOVASCULAR A: HTN Echo in 2015 with EF 60% P:  Check BNP  Cont current regimen  lovenox   RENAL A:   No acute issues  P:   Monitor   GASTROINTESTINAL A:   Nausea  P:   Cont zofran   HEMATOLOGIC A:   Anemia  P:  Monitor   INFECTIOUS A:   Afebrile/ WBC tr down  P:   Monitor  Check cxr   ENDOCRINE A:   DM    P:   SSI   NEUROLOGIC A:   Anxiety  P:   RASS goal: 0  Monitor  Caution with benzo    FAMILY  - Updates: no family at bedside   - Inter-disciplinary family meet or Palliative Care meeting due by:  Day  7   [facility] NP -C  Pulmonary and [place] Pager: [phone]  12/25/2014, 11:46 AM

## 2014-12-25 NOTE — Progress Notes (Signed)
Nutrition Follow-up  DOCUMENTATION CODES:   Severe malnutrition in context of chronic illness  INTERVENTION:  Glucerna Shake po TID, each supplement provides 220 kcal and 10 grams of protein  NUTRITION DIAGNOSIS:   Inadequate oral intake related to poor appetite as evidenced by meal completion < 50%. Progressing  GOAL:   Patient will meet greater than or equal to 90% of their needs Not yet met.   MONITOR:   PO intake, Supplement acceptance, Labs, Weight trends, Skin, I & O's  REASON FOR ASSESSMENT:   Consult Assessment of nutrition requirement/status  ASSESSMENT:   Judith Boyer is a 69 y.o. female with a Past Medical History of COPD, HTN, CAD, HLD, MI, DM, anemia who presents with COPD exacerbation.   Medications reviewed and include: solumedrol, colace, novolog Limited intake of Glucerna due to breathing issues Labs reviewed: potassium elevated (5.2), CBG's: 273-280 CCM recommends palliative care consult.   Diet Order:  Diet Carb Modified Fluid consistency:: Thin; Room service appropriate?: Yes  Skin:  Reviewed, no issues  Last BM:  12/20/14  Height:   Ht Readings from Last 1 Encounters:  12/21/14 '5\' 1"'  (1.549 m)   Weight:   Wt Readings from Last 1 Encounters:  12/21/14 98 lb 8.7 oz (44.7 kg)   Ideal Body Weight:  47.7 kg  BMI:  Body mass index is 18.63 kg/(m^2).  Estimated Nutritional Needs:   Kcal:  1300-1500  Protein:  55-65 grams  Fluid:  1.3-1.5 L  EDUCATION NEEDS:   Education needs addressed  Maylon Peppers RD, [place], [place] Pager [phone] After Hours Pager

## 2014-12-25 NOTE — Progress Notes (Addendum)
Progress Note   Judith Boyer [id number] DOB: [birth year] DOA: 01/04/2015 PCP: Jenny Reichmann, MD  Pulmonologist: Dr. Melvyn Novas   Brief Narrative:   Judith Boyer is an 69 y.o. female with a PMH of chronic respiratory failure on 2 L of home oxygen, COPD, hypertension, CAD, hyperlipidemia and type 2 diabetes who was admitted 01-04-2015 with acute on chronic respiratory failure.  The patient follows with Dr. Melvyn Novas and was started on daily prednisone 12/04/14.  Assessment/Plan:   Principle Problem: COPD exacerbation ([employer])./Acute on chronic respiratory failure with hypoxia - Continue nebulizers, IV Solu-Medrol, Mucinex, Brovana BID, Pulmicort BID and DuoNebs PRN. S/P 4 days of antibiotics. - Continue  Physiotherapy Vest BID, flutter valve. Continue BiPAP when necessary. - Continue Oxygen therapy.at 3L; titrate to maintain SPO2 89-93%. - Continue Xanax TID daily - helps breathing. - Continue Morphine 1 mg PRN feeling SOB. - Given ongoing symptoms, repeat CXR, check ABG, d-dimer, and BNP.   - Discussed case with Dr. Halford Chessman, who will see the patient in consultation.  Active Problems: Weakness / Severe protein calorie malnutrition - PT/OT, dietician consults.  Hyperkalemia  - Not on any supplementation. GFR 46. We'll give 15 g of Kayexalate.  Sinus Tachycardia/HTN - Continue Cardizam 30 mg TID. Heart rate in the 90s.  Uncontrolled diabetes mellitus type 2 uncontrolled with complications - Hemoglobin A1c= 8.3%. - Monitor CBGs closely on Solu-Medrol. - Currently being managed with insulin resistant SSI and 6 units of meal coverage. CBGs 124-310. - We'll add NPH 7 units twice a day per diabetes coordinator recommendations.  CAD, s/p stenting 2009.  - On chronic Plavix. Echocardiogram January 2015, ejection fraction 55-60%.  DVT Prophylaxis - Lovenox ordered.   Family Communication/Anticipated D/C date and plan/Code Status   Family Communication: Son updated at the  bedside. Disposition Plan: SNF recommended by PT. Anticipated D/C date:   2-3 days if respiratory status improved. Still very dyspneic. Code Status: LCB, DNI   IV Access:    Peripheral IV   Procedures and diagnostic studies:   Dg Chest Port 1 View  2015/01/04  CLINICAL DATA:  Shortness of breath and cough for 3 days EXAM: PORTABLE CHEST 1 VIEW COMPARISON:  09/09/2014 FINDINGS: The heart size and mediastinal contours are within normal limits. Both lungs are clear. The visualized skeletal structures are unremarkable. IMPRESSION: No active disease. Electronically Signed   By: Kerby Moors M.D.   On: January 04, 2015 13:21     Medical Consultants:    Dr. Halford Chessman, Pulmonology  Anti-Infectives:   Anti-infectives    Start     Dose/Rate Route Frequency Ordered Stop   12/21/14 1800  cefTRIAXone (ROCEPHIN) 1 g in dextrose 5 % 50 mL IVPB  Status:  Discontinued     1 g 100 mL/hr over 30 Minutes Intravenous Every 24 hours 12/21/14 1544 12/22/14 1613   12/21/14 1545  azithromycin (ZITHROMAX) 500 mg in dextrose 5 % 250 mL IVPB  Status:  Discontinued     500 mg 250 mL/hr over 60 Minutes Intravenous Every 24 hours 12/21/14 1538 12/21/14 1540   01/04/2015 1830  levofloxacin (LEVAQUIN) IVPB 750 mg     750 mg 100 mL/hr over 90 Minutes Intravenous Every 48 hours 01-04-2015 1816 12/24/14 1909      Subjective:   Judith Boyer continues to feel short of breath, no improvement.  Has had some nausea and constipation.  Anxious at times, per nursing.  No fevers.   Objective:    Filed Vitals:  12/25/14 0500 12/25/14 0600 12/25/14 0752 12/25/14 0755  BP:   169/66 169/66  Pulse: 88 103 109 106  Temp:   98 F (36.7 C)   TempSrc:   Oral   Resp: 22 21 24  35  Height:      Weight:      SpO2: 100% 98% 96% 97%    Intake/Output Summary (Last 24 hours) at 12/25/14 0857 Last data filed at 12/25/14 0755  Gross per 24 hour  Intake    970 ml  Output   2225 ml  Net  -1255 ml   Filed Weights    12/20/14 1301 12/20/14 1835 12/21/14 0137  Weight: 43.999 kg (97 lb) 43.8 kg (96 lb 9 oz) 44.7 kg (98 lb 8.7 oz)    Exam: Gen:  NAD Cardiovascular:  Mildly tachycardia, No M/R/G Respiratory:  Lungs diminished with poor air movement Gastrointestinal:  Abdomen soft, NT/ND, + BS Extremities:  2+ edema   Data Reviewed:    Labs: Basic Metabolic Panel:  Recent Labs Lab 12/20/14 1350 12/22/14 0240 12/23/14 0310 12/24/14 0410 12/25/14 0254  NA 140 139 140 141 137  K 3.8 4.1 4.4 4.7 5.2*  CL 98* 95* 98* 99* 96*  CO2 32 33* 35* 38* 32  GLUCOSE 176* 161* 139* 200* 261*  BUN 15 24* 20 22* 22*  CREATININE 0.48 0.52 0.50 0.55 0.53  CALCIUM 9.4 8.8* 8.7* 8.8* 9.0  MG  --  2.1 2.2 2.2  --    GFR Estimated Creatinine Clearance: 46.2 mL/min (by C-G formula based on Cr of 0.53). Liver Function Tests:  Recent Labs Lab 12/22/14 0240 12/23/14 0310 12/24/14 0410  AST 18 18 20   ALT 19 20 23   ALKPHOS 48 49 49  BILITOT 0.4 0.2* 0.1*  PROT 5.4* 5.8* 5.7*  ALBUMIN 3.1* 3.0* 3.0*   Coagulation profile  Recent Labs Lab 12/20/14 1910  INR 1.05    CBC:  Recent Labs Lab 12/20/14 1350 12/22/14 0240 12/23/14 0310 12/24/14 0410 12/25/14 0254  WBC 12.4* 17.8* 11.7* 15.5* 10.6*  NEUTROABS 11.3* 16.8* 10.9* 14.2*  --   HGB 11.5* 11.7* 11.5* 11.0* 11.2*  HCT 37.6 38.0 37.0 36.5 36.9  MCV 93.3 93.1 93.4 94.1 93.9  PLT 340 332 330 383 358   CBG:  Recent Labs Lab 12/24/14 0757 12/24/14 1210 12/24/14 1629 12/24/14 2143 12/25/14 0801  GLUCAP 124* 310* 239* 273* 280*   Sepsis Labs:  Recent Labs Lab 12/22/14 0240 12/23/14 0310 12/24/14 0410 12/25/14 0254  WBC 17.8* 11.7* 15.5* 10.6*  LATICACIDVEN 0.9  --   --   --    Microbiology Recent Results (from the past 240 hour(s))  MRSA PCR Screening     Status: None   Collection Time: 12/21/14  1:34 AM  Result Value Ref Range Status   MRSA by PCR NEGATIVE NEGATIVE Final    Comment:        The GeneXpert MRSA Assay  (FDA approved for NASAL specimens only), is one component of a comprehensive MRSA colonization surveillance program. It is not intended to diagnose MRSA infection nor to guide or monitor treatment for MRSA infections.      Medications:   . ALPRAZolam  0.25 mg Oral TID  . antiseptic oral rinse  7 mL Mouth Rinse BID  . arformoterol  15 mcg Nebulization BID  . budesonide (PULMICORT) nebulizer solution  0.25 mg Nebulization BID  . clopidogrel  75 mg Oral q morning - 10a  . diltiazem  30 mg Oral  3 times per day  . enoxaparin (LOVENOX) injection  30 mg Subcutaneous Q24H  . feeding supplement (GLUCERNA SHAKE)  237 mL Oral TID BM  . guaiFENesin  1,200 mg Oral Daily  . insulin aspart  0-20 Units Subcutaneous TID WC  . insulin aspart  0-5 Units Subcutaneous QHS  . insulin aspart  6 Units Subcutaneous TID WC  . methylPREDNISolone (SOLU-MEDROL) injection  60 mg Intravenous Q12H  . montelukast  10 mg Oral Daily  . pravastatin  80 mg Oral QPM   Continuous Infusions:   Time spent: 35 minutes.  The patient is medically complex with multiple co-morbidities and is at high risk for clinical deterioration and requires high complexity decision making.     LOS: 4 days   [place] [facility] Pager [phone]. If unable to reach me by pager, please call my cell phone at [phone].  *Please refer to [email or web address], password [email or web address] to get updated schedule on who will round on this patient, as hospitalists switch teams weekly. If 7PM-7AM, please contact night-coverage at [email or web address], password [email or web address] for any overnight needs.  12/25/2014, 8:57 AM

## 2014-12-26 DIAGNOSIS — J449 Chronic obstructive pulmonary disease, unspecified: Secondary | ICD-10-CM

## 2014-12-26 LAB — GLUCOSE, CAPILLARY
GLUCOSE-CAPILLARY: 281 mg/dL — AB (ref 65–99)
GLUCOSE-CAPILLARY: 262 mg/dL — AB (ref 65–99)
GLUCOSE-CAPILLARY: 199 mg/dL — AB (ref 65–99)
Glucose-Capillary: 299 mg/dL — ABNORMAL HIGH (ref 65–99)
Glucose-Capillary: 177 mg/dL — ABNORMAL HIGH (ref 65–99)

## 2014-12-26 LAB — BASIC METABOLIC PANEL
Anion gap: 4 — ABNORMAL LOW (ref 5–15)
BUN: 28 mg/dL — AB (ref 6–20)
CHLORIDE: 100 mmol/L — AB (ref 101–111)
CO2: 36 mmol/L — ABNORMAL HIGH (ref 22–32)
Calcium: 8.9 mg/dL (ref 8.9–10.3)
Creatinine, Ser: 0.51 mg/dL (ref 0.44–1.00)
GFR calc Af Amer: >60 mL/min (ref >60)
GFR calc non Af Amer: >60 mL/min (ref >60)
GLUCOSE: 236 mg/dL — AB (ref 65–99)
POTASSIUM: 4.7 mmol/L (ref 3.5–5.1)
Sodium: 140 mmol/L (ref 135–145)

## 2014-12-26 MED ORDER — METHYLPREDNISOLONE SODIUM SUCC 40 MG IJ SOLR
40.0000 mg | Freq: Three times a day (TID) | INTRAMUSCULAR | Status: DC
  Administered 2014-12-26 – 2014-12-28 (×5): 40 mg via INTRAVENOUS
  Filled 2014-12-26 (×6): qty 1

## 2014-12-26 MED ORDER — ROFLUMILAST 500 MCG PO TABS
500.0000 ug | ORAL_TABLET | Freq: Every day | ORAL | Status: DC
  Administered 2014-12-26 – 2014-12-31 (×6): 500 ug via ORAL
  Filled 2014-12-26 (×6): qty 1

## 2014-12-26 MED ORDER — FUROSEMIDE 10 MG/ML IJ SOLN
40.0000 mg | Freq: Once | INTRAMUSCULAR | Status: AC
  Administered 2014-12-26: 40 mg via INTRAVENOUS
  Filled 2014-12-26: qty 4

## 2014-12-26 MED ORDER — ALPRAZOLAM 0.5 MG PO TABS
0.5000 mg | ORAL_TABLET | Freq: Four times a day (QID) | ORAL | Status: DC | PRN
  Administered 2014-12-26 – 2014-12-29 (×8): 0.5 mg via ORAL
  Filled 2014-12-26 (×8): qty 1

## 2014-12-26 MED ORDER — INSULIN NPH (HUMAN) (ISOPHANE) 100 UNIT/ML ~~LOC~~ SUSP
10.0000 [IU] | Freq: Two times a day (BID) | SUBCUTANEOUS | Status: DC
  Administered 2014-12-26 – 2014-12-28 (×4): 10 [IU] via SUBCUTANEOUS
  Filled 2014-12-26: qty 10

## 2014-12-26 MED ORDER — TRAZODONE HCL 50 MG PO TABS
100.0000 mg | ORAL_TABLET | Freq: Every evening | ORAL | Status: DC | PRN
  Administered 2014-12-26: 100 mg via ORAL
  Filled 2014-12-26: qty 2

## 2014-12-26 NOTE — Progress Notes (Signed)
Placed patient on CPAP for the night via auto-mode with minimum pressure set at 10cm and maximum pressure set at 18cm. Oxygen set at 3lpm

## 2014-12-26 NOTE — Progress Notes (Signed)
PULMONARY / CRITICAL CARE MEDICINE   Name: TORIANNE SPARKMAN MRN: [id number] DOB: [birth year]    ADMISSION DATE:  12/20/2014 CONSULTATION DATE:  12/25/14  REFERRING MD :  TR[facility]/Rizwan   CHIEF COMPLAINT:  COPD exacerbation   SUBJECTIVE:  Still very short of breath.  Couldn't sleep last night.  VITAL SIGNS: BP 157/67 mmHg  Pulse 101  Temp(Src) 98.5 F (36.9 C) (Oral)  Resp 21  Ht 5\' 1"  (1.549 m)  Wt 98 lb 8.7 oz (44.7 kg)  BMI 18.63 kg/m2  SpO2 94%  INTAKE / OUTPUT: I/O last 3 completed shifts: In: 240 [P.O.:240] Out: 2325 [Urine:2325]  PHYSICAL EXAMINATION: General: sitting at side of bed HEENT: purse lip breathing Cardiac: regular, tachycardic Chest: poor air movement, faint wheeze Abd: soft, non tender Ext: 1+ edema Neuro: normal strength   LABS:  CBC  Recent Labs Lab 12/23/14 0310 12/24/14 0410 12/25/14 0254  WBC 11.7* 15.5* 10.6*  HGB 11.5* 11.0* 11.2*  HCT 37.0 36.5 36.9  PLT 330 383 358   Coag's  Recent Labs Lab 12/20/14 1910  APTT 24  INR 1.05   BMET  Recent Labs Lab 12/24/14 0410 12/25/14 0254 12/26/14 0335  NA 141 137 140  K 4.7 5.2* 4.7  CL 99* 96* 100*  CO2 38* 32 36*  BUN 22* 22* 28*  CREATININE 0.55 0.53 0.51  GLUCOSE 200* 261* 236*   Electrolytes  Recent Labs Lab 12/22/14 0240 12/23/14 0310 12/24/14 0410 12/25/14 0254 12/26/14 0335  CALCIUM 8.8* 8.7* 8.8* 9.0 8.9  MG 2.1 2.2 2.2  --   --    Sepsis Markers  Recent Labs Lab 12/22/14 0240  LATICACIDVEN 0.9   ABG  Recent Labs Lab 12/21/14 2124 12/22/14 0346 12/25/14 1227  PHART 7.382 7.384 7.391  PCO2ART 61.0* 55.8* 59.7*  PO2ART 85.0 81.0 74.0*   Liver Enzymes  Recent Labs Lab 12/22/14 0240 12/23/14 0310 12/24/14 0410  AST 18 18 20   ALT 19 20 23   ALKPHOS 48 49 49  BILITOT 0.4 0.2* 0.1*  ALBUMIN 3.1* 3.0* 3.0*   Cardiac Enzymes No results for input(s): TROPONINI, PROBNP in the last 168 hours. Glucose  Recent Labs Lab 12/24/14 2143  12/25/14 0801 12/25/14 1208 12/25/14 1751 12/25/14 2149 12/26/14 0732  GLUCAP 273* 280* 276* 199* 281* 299*    Imaging Dg Chest Port 1 View  12/25/2014  CLINICAL DATA:  Acute dyspnea. History of hypertension, diabetes and COPD. EXAM: PORTABLE CHEST 1 VIEW COMPARISON:  12/20/2014 FINDINGS: Lungs are hyperexpanded, but clear. No pleural effusion or pneumothorax. Cardiac silhouette is top-normal in size. No mediastinal or hilar masses or evidence of adenopathy. Bony thorax is demineralized but grossly intact. IMPRESSION: 1. No acute cardiopulmonary disease. 2. Hyperexpanded lungs consistent with COPD. Electronically Signed   By: Lajean Manes M.D.   On: 12/25/2014 13:09    STUDIES:   CULTURES:  ANTIBIOTICS: Levaquin 11/14 >> 11/19   SIGNIFICANT EVENTS: 11/14 Admit  LINES/TUBES:  DISCUSSION: 69 yo female with severe COPD admitted with exacerbation, and slow to improve.  ASSESSMENT / PLAN:  Acute on chronic hypoxic/hypercapnic respiratory failure 2nd to AECOPD. 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 Plan: - oxygen to keep SpO2 88 to 95% - BiPAP qhs and prn during the day - continue pulmicort, brovana, spiriva - continue solumedrol - d/c singulair >> not sure it is adding much - add daliresp 11/20 - prn morphine for dyspnea - bronchial hygiene - lasix 40 mg IV x one 11/20  Anxiety, insomnia. Plan: -  prn xanax, ativan - add trazodone qhs prn  Goals of Care. Plan: - DNI - might benefit from palliative care assessment  Chesley Mires, MD [facility] Pulmonary/Critical Care 12/26/2014, 9:43 AM Pager:  [phone] After 3pm call: [phone]

## 2014-12-26 NOTE — Progress Notes (Signed)
Progress Note   Judith Boyer [id number] DOB: 10/30/1945 DOA: 12/20/2014 PCP: Jenny Reichmann, MD  Pulmonologist: Dr. Melvyn Novas   Brief Narrative:   Judith Boyer is an 69 y.o. female with a PMH of chronic respiratory failure on 2 L of home oxygen, COPD, hypertension, CAD, hyperlipidemia and type 2 diabetes who was admitted 12/20/14 with acute on chronic respiratory failure.  The patient follows with Dr. Melvyn Novas and was started on daily prednisone 12/04/14.  Assessment/Plan:   Principle Problem: COPD exacerbation ([place])./Acute on chronic respiratory failure with hypoxia - Continue nebulizers, IV Solu-Medrol, Mucinex, Brovana BID, Pulmicort BID, Spiriva and DuoNebs PRN. S/P 4 days of antibiotics. - Continue  Physiotherapy Vest BID, flutter valve. Continue BiPAP when necessary & QHS. - Continue Oxygen therapy.at 3L; titrate to maintain SPO2 89-95%. - Continue Xanax TID daily - helps breathing. - Continue Morphine 1 mg PRN feeling SOB. - CXR repeated January 22, 2015, no acute findings, d-dimer and BNP not elevated.   - Discussed case with Dr. Halford Chessman, who is now consulting. Daliresp added.  Lasix x 1 given. - Respiratory status not improved.  Had to stay on Bipap yesterday, removed it at 3:00 a.m. - ? May tolerate CPAP better. Discussed with Dr. Halford Chessman, will try.  Active Problems: Weakness / Severe protein calorie malnutrition - PT/OT, dietician consults.  Hyperkalemia  - Not on any supplementation. GFR 46. Given 15 g of Kayexalate January 22, 2015 with normalization of potassium.  Sinus Tachycardia/HTN - Continue Cardizam 30 mg TID. Heart rate in the 90s.  Uncontrolled diabetes mellitus type 2 uncontrolled with complications - Hemoglobin A1c= 8.3%. - Monitor CBGs closely on Solu-Medrol. - Currently being managed with 7 units of NPH twice a day, insulin resistant SSI and 6 units of meal coverage. CBGs 199-299. - Increase NPH to 10 units twice a day.  CAD, s/p stenting 2009.  - On  chronic Plavix. Echocardiogram January 2015, ejection fraction 55-60%.  DVT Prophylaxis - Lovenox ordered.   Family Communication/Anticipated D/C date and plan/Code Status   Family Communication: Son updated at the bedside 01-22-15, no family present today. Disposition Plan: SNF recommended by PT. Anticipated D/C date:   2-3 days if respiratory status improved. Still very dyspneic. Code Status: LCB, DNI   IV Access:    Peripheral IV   Procedures and diagnostic studies:   Dg Chest Port 1 View  01/22/2015  CLINICAL DATA:  Acute dyspnea. History of hypertension, diabetes and COPD. EXAM: PORTABLE CHEST 1 VIEW COMPARISON:  12/20/2014 FINDINGS: Lungs are hyperexpanded, but clear. No pleural effusion or pneumothorax. Cardiac silhouette is top-normal in size. No mediastinal or hilar masses or evidence of adenopathy. Bony thorax is demineralized but grossly intact. IMPRESSION: 1. No acute cardiopulmonary disease. 2. Hyperexpanded lungs consistent with COPD. Electronically Signed   By: Lajean Manes M.D.   On: Jan 22, 2015 13:09   Dg Chest Port 1 View  12/20/2014  CLINICAL DATA:  Shortness of breath and cough for 3 days EXAM: PORTABLE CHEST 1 VIEW COMPARISON:  09/09/2014 FINDINGS: The heart size and mediastinal contours are within normal limits. Both lungs are clear. The visualized skeletal structures are unremarkable. IMPRESSION: No active disease. Electronically Signed   By: Kerby Moors M.D.   On: 12/20/2014 13:21     Medical Consultants:    Dr. Halford Chessman, Pulmonology  Anti-Infectives:   Anti-infectives    Start     Dose/Rate Route Frequency Ordered Stop   12/21/14 1800  cefTRIAXone (ROCEPHIN) 1 g in dextrose 5 % 50  mL IVPB  Status:  Discontinued     1 g 100 mL/hr over 30 Minutes Intravenous Every 24 hours 12/21/14 1544 12/22/14 1613   12/21/14 1545  azithromycin (ZITHROMAX) 500 mg in dextrose 5 % 250 mL IVPB  Status:  Discontinued     500 mg 250 mL/hr over 60 Minutes Intravenous  Every 24 hours 12/21/14 1538 12/21/14 1540   12/20/14 1830  levofloxacin (LEVAQUIN) IVPB 750 mg     750 mg 100 mL/hr over 90 Minutes Intravenous Every 48 hours 12/20/14 1816 12/24/14 1909      Subjective:   Judith Boyer continues to feel short of breath, no improvement.  Has had some nausea and constipation.  Anxious at times, per nursing.  No fevers.   Objective:    Filed Vitals:   12/26/14 0402 12/26/14 0403 12/26/14 0729 12/26/14 0800  BP:  140/56 157/67   Pulse:  88 104 101  Temp: 97.5 F (36.4 C)  98.5 F (36.9 C)   TempSrc: Oral  Oral   Resp:  24 20 21   Height:      Weight:      SpO2:  100% 94% 94%    Intake/Output Summary (Last 24 hours) at 12/26/14 0956 Last data filed at 12/25/14 2300  Gross per 24 hour  Intake    120 ml  Output   1175 ml  Net  -1055 ml   Filed Weights   12/20/14 1301 12/20/14 1835 12/21/14 0137  Weight: 43.999 kg (97 lb) 43.8 kg (96 lb 9 oz) 44.7 kg (98 lb 8.7 oz)    Exam: Gen:  NAD Cardiovascular:  Mildly tachycardia, No M/R/G Respiratory:  Lungs diminished with poor air movement Gastrointestinal:  Abdomen soft, NT/ND, + BS Extremities:  2+ edema   Data Reviewed:    Labs: Basic Metabolic Panel:  Recent Labs Lab 12/22/14 0240 12/23/14 0310 12/24/14 0410 12/25/14 0254 12/26/14 0335  NA 139 140 141 137 140  K 4.1 4.4 4.7 5.2* 4.7  CL 95* 98* 99* 96* 100*  CO2 33* 35* 38* 32 36*  GLUCOSE 161* 139* 200* 261* 236*  BUN 24* 20 22* 22* 28*  CREATININE 0.52 0.50 0.55 0.53 0.51  CALCIUM 8.8* 8.7* 8.8* 9.0 8.9  MG 2.1 2.2 2.2  --   --    GFR Estimated Creatinine Clearance: 46.2 mL/min (by C-G formula based on Cr of 0.51). Liver Function Tests:  Recent Labs Lab 12/22/14 0240 12/23/14 0310 12/24/14 0410  AST 18 18 20   ALT 19 20 23   ALKPHOS 48 49 49  BILITOT 0.4 0.2* 0.1*  PROT 5.4* 5.8* 5.7*  ALBUMIN 3.1* 3.0* 3.0*   Coagulation profile  Recent Labs Lab 12/20/14 1910  INR 1.05    CBC:  Recent  Labs Lab 12/20/14 1350 12/22/14 0240 12/23/14 0310 12/24/14 0410 12/25/14 0254  WBC 12.4* 17.8* 11.7* 15.5* 10.6*  NEUTROABS 11.3* 16.8* 10.9* 14.2*  --   HGB 11.5* 11.7* 11.5* 11.0* 11.2*  HCT 37.6 38.0 37.0 36.5 36.9  MCV 93.3 93.1 93.4 94.1 93.9  PLT 340 332 330 383 358   CBG:  Recent Labs Lab 12/25/14 0801 12/25/14 1208 12/25/14 1751 12/25/14 2149 12/26/14 0732  GLUCAP 280* 276* 199* 281* 299*   Sepsis Labs:  Recent Labs Lab 12/22/14 0240 12/23/14 0310 12/24/14 0410 12/25/14 0254  WBC 17.8* 11.7* 15.5* 10.6*  LATICACIDVEN 0.9  --   --   --    Microbiology Recent Results (from the past 240 hour(s))  MRSA  PCR Screening     Status: None   Collection Time: 12/21/14  1:34 AM  Result Value Ref Range Status   MRSA by PCR NEGATIVE NEGATIVE Final    Comment:        The GeneXpert MRSA Assay (FDA approved for NASAL specimens only), is one component of a comprehensive MRSA colonization surveillance program. It is not intended to diagnose MRSA infection nor to guide or monitor treatment for MRSA infections.      Medications:   . antiseptic oral rinse  7 mL Mouth Rinse BID  . arformoterol  15 mcg Nebulization BID  . budesonide (PULMICORT) nebulizer solution  0.5 mg Nebulization BID  . clopidogrel  75 mg Oral q morning - 10a  . diltiazem  30 mg Oral 3 times per day  . docusate sodium  100 mg Oral QHS  . enoxaparin (LOVENOX) injection  30 mg Subcutaneous Q24H  . feeding supplement (GLUCERNA SHAKE)  237 mL Oral TID BM  . furosemide  40 mg Intravenous Once  . guaiFENesin  1,200 mg Oral Daily  . insulin aspart  0-20 Units Subcutaneous TID WC  . insulin aspart  0-5 Units Subcutaneous QHS  . insulin aspart  6 Units Subcutaneous TID WC  . insulin NPH Human  7 Units Subcutaneous BID AC & HS  . methylPREDNISolone (SOLU-MEDROL) injection  40 mg Intravenous 3 times per day  . pravastatin  80 mg Oral QPM  . roflumilast  500 mcg Oral Daily  . tiotropium  18 mcg  Inhalation Daily   Continuous Infusions:   Time spent: 35 minutes.  The patient is medically complex with multiple co-morbidities and is at high risk for clinical deterioration and requires high complexity decision making.     LOS: 5 days   [place] [facility] Pager [phone]. If unable to reach me by pager, please call my cell phone at [phone].  *Please refer to [email or web address], password [email or web address] to get updated schedule on who will round on this patient, as hospitalists switch teams weekly. If 7PM-7AM, please contact night-coverage at [email or web address], password [email or web address] for any overnight needs.  12/26/2014, 9:56 AM

## 2014-12-27 LAB — GLUCOSE, CAPILLARY
GLUCOSE-CAPILLARY: 218 mg/dL — AB (ref 65–99)
Glucose-Capillary: 164 mg/dL — ABNORMAL HIGH (ref 65–99)
Glucose-Capillary: 137 mg/dL — ABNORMAL HIGH (ref 65–99)

## 2014-12-27 MED ORDER — BISACODYL 10 MG RE SUPP
10.0000 mg | Freq: Every day | RECTAL | Status: DC | PRN
Start: 2014-12-27 — End: 2014-12-31
  Administered 2014-12-28: 10 mg via RECTAL
  Filled 2014-12-27 (×2): qty 1

## 2014-12-27 MED ORDER — FLEET ENEMA 7-19 GM/118ML RE ENEM: 1.0000 | ENEMA | RECTAL | Status: DC | PRN

## 2014-12-27 MED ORDER — IPRATROPIUM-ALBUTEROL 0.5-2.5 (3) MG/3ML IN SOLN
3.0000 mL | Freq: Four times a day (QID) | RESPIRATORY_TRACT | Status: DC
  Administered 2014-12-27 – 2014-12-28 (×5): 3 mL via RESPIRATORY_TRACT
  Filled 2014-12-27 (×5): qty 3

## 2014-12-27 MED ORDER — SENNOSIDES-DOCUSATE SODIUM 8.6-50 MG PO TABS
2.0000 | ORAL_TABLET | Freq: Every day | ORAL | Status: DC
  Administered 2014-12-27 – 2014-12-30 (×4): 2 via ORAL
  Filled 2014-12-27 (×4): qty 2

## 2014-12-27 MED ORDER — INSULIN ASPART 100 UNIT/ML ~~LOC~~ SOLN
8.0000 [IU] | Freq: Three times a day (TID) | SUBCUTANEOUS | Status: DC
  Administered 2014-12-27 – 2014-12-29 (×6): 8 [IU] via SUBCUTANEOUS

## 2014-12-27 NOTE — Progress Notes (Signed)
Inpatient Diabetes Program Recommendations  AACE/ADA: New Consensus Statement on Inpatient Glycemic Control (2015)  Target Ranges:  Prepandial:   less than 140 mg/dL      Peak postprandial:   less than 180 mg/dL (1-2 hours)      Critically ill patients:  140 - 180 mg/dL  Results for EARNSTINE, KROUT (MRN [id number]) as of 12/27/2014 08:51  Ref. Range 12/26/2014 07:32 12/26/2014 11:43 12/26/2014 17:04 12/26/2014 21:36 12/27/2014 07:53  Glucose-Capillary Latest Ref Range: 65-99 mg/dL 299 (H) 262 (H) 177 (H) 199 (H) 218 (H)   Review of Glycemic Control  Diabetes history: DM2 Outpatient Diabetes medications: NPH 14 units daily, Metformin 1000 mg BID Current orders for Inpatient glycemic control: NPH 10 units BID, Novolog 6 units TID with meals for meal coverage, Novolog 0-20 units TID with meals, Novolog 0-5 units HS  Inpatient Diabetes Program Recommendations: Insulin - Basal: Noted NPH was increased to 10 units BID yesterday evening.  Insulin - Meal Coverage: If steroids are continued as ordered (Solumedrol 40 mg TID), please consider increasing meal coverage to Novolog 8 units TID with meals.  Thanks, Barnie Alderman, RN, MSN, CDE Diabetes Coordinator Inpatient Diabetes Program [phone] (Team Pager from [place] to [place]) [phone] ([facility] office) [phone] [facility] office) [phone] [facility] office)

## 2014-12-27 NOTE — Progress Notes (Signed)
Patient stated she does not want to wear CPAP at this time due to the laxative she has taken. RN aware.

## 2014-12-27 NOTE — Care Management Important Message (Signed)
Important Message  Patient Details  Name: Judith Boyer MRN: [id number] Date of Birth: 1946/01/23   Medicare Important Message Given:  Yes    [name] 12/27/2014, 3:27 PM

## 2014-12-27 NOTE — Progress Notes (Signed)
Patient removed CPAP, stated she couldn't breath with CPAP machine. Asked patient if she wanted to try Bipap. She said not at this time and will notify respiratory if she changes her mind.

## 2014-12-27 NOTE — Progress Notes (Signed)
Physical Therapy Treatment Patient Details Name: Judith Boyer MRN: [id number] DOB: 11-Oct-1945 Today's Date: 12/27/2014    History of Present Illness Judith Boyer is a 69 y.o. female  with COPD, DM2, HTN, CAD, MI 2013. Patient admitted with acute on chronic respiratory failure    PT Comments    Pt admitted with above diagnosis. Pt currently with functional limitations due to balance and endurance deficits. Pt only able to ambulate around the bed and she was fatigued with her HR increased considerably as well.  Will continue as able.   Pt will benefit from skilled PT to increase their independence and safety with mobility to allow discharge to the venue listed below.    Follow Up Recommendations  SNF;Supervision/Assistance - 24 hour     Equipment Recommendations  Wheelchair (measurements PT)    Recommendations for Other Services       Precautions / Restrictions Precautions Precautions: Fall Precaution Comments: O2 dependent Restrictions Weight Bearing Restrictions: No    Mobility  Bed Mobility Overal bed mobility: Needs Assistance Bed Mobility: Supine to Sit     Supine to sit: Min assist;HOB elevated     General bed mobility comments: Incr time for pt to get to EOB.   Transfers Overall transfer level: Needs assistance Equipment used: Rolling walker (2 wheeled) Transfers: Sit to/from Stand Sit to Stand: Min assist         General transfer comment: Incr time, pulls up on RW.  Needed a little assist to power up and for steadying once up.   Ambulation/Gait Ambulation/Gait assistance: Min assist;Mod assist Ambulation Distance (Feet): 15 Feet Assistive device: Rolling walker (2 wheeled) Gait Pattern/deviations: Step-through pattern;Decreased stride length   Gait velocity interpretation: Below normal speed for age/gender General Gait Details: shaky and visibly weak, dyspneic throughout though sats 93% and above mobilizing on 4 L.  Needed cues to stay  close to RW and assist to steer RW as well.    Stairs            Wheelchair Mobility    Modified Rankin (Stroke Patients Only)       Balance Overall balance assessment: Needs assistance Sitting-balance support: Feet supported;Bilateral upper extremity supported Sitting balance-Leahy Scale: Poor Sitting balance - Comments: Needed UE support to sit EOB.   Standing balance support: Bilateral upper extremity supported;During functional activity Standing balance-Leahy Scale: Poor Standing balance comment: Pt requiring UE support for balance.                    Cognition Arousal/Alertness: Awake/alert Behavior During Therapy: Anxious Overall Cognitive Status: Within Functional Limits for tasks assessed                      Exercises      General Comments General comments (skin integrity, edema, etc.): Fatigues rapidly without very little activity.  Pt fatigued after walking around bed to chair.  HR incr quite a bit as well.  Pt did not feel she could make a second walk.        Pertinent Vitals/Pain Pain Assessment: No/denies pain  107-129 bpm, 93% on 4LO2    Home Living                      Prior Function            PT Goals (current goals can now be found in the care plan section) Progress towards PT goals: Progressing toward goals  Frequency  Min 3X/week    PT Plan Current plan remains appropriate    Co-evaluation             End of Session Equipment Utilized During Treatment: Gait belt;Oxygen Activity Tolerance: Patient limited by fatigue Patient left: in chair;with call bell/phone within reach     Time: [id number] PT Time Calculation (min) (ACUTE ONLY): 27 min  Charges:  $Gait Training: 23-37 mins                    G Codes:      WhiteGodfrey Pick 01/01/15, 1:15 PM  [facility] [phone] (pager)

## 2014-12-27 NOTE — Progress Notes (Signed)
Placed patient back on CPAP via auto-mode with minimum pressure set at 10cm. Oxygen set at 2lpm

## 2014-12-27 NOTE — Progress Notes (Signed)
[place] TEAM 1 - Stepdown/ICU TEAM  Progress Note   Judith Boyer [id number] DOB: [birth year] DOA: 12/20/2014 PCP: Jenny Reichmann, MD  Pulmonologist: Dr. Melvyn Novas  Brief Narrative:   Judith Boyer is an 69 y.o. female with a PMH of chronic respiratory failure on 2 L of home oxygen, COPD, hypertension, CAD, hyperlipidemia and type 2 diabetes who was admitted 12/20/14 with acute on chronic respiratory failure.  The patient follows with Dr. Melvyn Novas and was started on daily prednisone 12/04/14.  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 - CBGs remain elevated on steroids - adjust tx further today and follow   CAD, s/p stenting 2009 - On chronic Plavix. Echocardiogram January 2015, ejection fraction 55-60%  Noncompliance with medications -Continue to counsel patient on absolute need to strictly adhere to her prescribed medical regimen  DVT Prophylaxis - Lovenox    Family Communication/Anticipated D/C date and plan/Code Status   Family Communication: no family present at time of exam today  Disposition Plan: SNF recommended by PT Code Status: DNI   IV Access:    Peripheral IV   Procedures and diagnostic studies:   Dg Chest Port 1 View  2014/12/26  CLINICAL DATA:  Acute dyspnea. History of hypertension, diabetes and COPD. EXAM: PORTABLE CHEST 1 VIEW COMPARISON:  12/20/2014 FINDINGS: Lungs are hyperexpanded, but clear. No pleural effusion or pneumothorax.  Cardiac silhouette is top-normal in size. No mediastinal or hilar masses or evidence of adenopathy. Bony thorax is demineralized but grossly intact. IMPRESSION: 1. No acute cardiopulmonary disease. 2. Hyperexpanded lungs consistent with COPD. Electronically Signed   By: Lajean Manes M.D.   On: 12/26/2014 13:09   Dg Chest Port 1 View  12/20/2014  CLINICAL DATA:  Shortness of breath and cough for 3 days EXAM: PORTABLE CHEST 1 VIEW COMPARISON:  09/09/2014 FINDINGS: The heart size and mediastinal contours are within normal limits. Both lungs are clear. The visualized skeletal structures are unremarkable. IMPRESSION: No active disease. Electronically Signed   By: Kerby Moors M.D.   On: 12/20/2014 13:21     Medical Consultants:    Dr. Halford Chessman, Pulmonology  Anti-Infectives:   Anti-infectives    Start     Dose/Rate Route Frequency Ordered Stop   12/21/14 1800  cefTRIAXone (ROCEPHIN) 1 g in dextrose 5 % 50 mL IVPB  Status:  Discontinued     1 g 100 mL/hr over 30 Minutes Intravenous Every 24 hours 12/21/14 1544 12/22/14 1613   12/21/14 1545  azithromycin (ZITHROMAX) 500 mg in dextrose 5 % 250 mL IVPB  Status:  Discontinued     500 mg 250 mL/hr over 60 Minutes Intravenous Every 24 hours 12/21/14 1538 12/21/14 1540   12/20/14 1830  levofloxacin (LEVAQUIN) IVPB 750 mg     750 mg 100 mL/hr over 90 Minutes Intravenous Every 48 hours 12/20/14 1816 12/24/14 1909      Subjective:   The pt does not feel that she has improved over the  last 24hours.  She had difficulty tolerating CPAP last night.  She denies cp, n/v, or abdom pain.     Objective:    Filed Vitals:   12/27/14 0010 12/27/14 0243 12/27/14 0400 12/27/14 0723  BP: 136/68  160/79 117/58  Pulse: 91     Temp:    97.3 F (36.3 C)  TempSrc:    Oral  Resp: 18     Height:      Weight:      SpO2: 99% 95%      Intake/Output Summary (Last 24 hours) at 12/27/14 0959 Last data filed at 12/27/14 0319  Gross per 24 hour  Intake    360 ml   Output   1400 ml  Net  -1040 ml   Filed Weights   12/20/14 1301 12/20/14 1835 12/21/14 0137  Weight: 43.999 kg (97 lb) 43.8 kg (96 lb 9 oz) 44.7 kg (98 lb 8.7 oz)    Exam: General: modest resp distress as per her baseline  Lungs: very poor air movement th/o all fields - diffuse wheezing - able to complete full sentences  Cardiovascular: tachycardic at 110bpm - regular - no murmer  Abdomen: Nontender, nondistended, soft, bowel sounds positive, no rebound, no ascites, no appreciable mass Extremities: No significant cyanosis, clubbing, or edema bilateral lower extremities    Data Reviewed:   Labs: Basic Metabolic Panel:  Recent Labs Lab 12/22/14 0240 12/23/14 0310 12/24/14 0410 12/25/14 0254 12/26/14 0335  NA 139 140 141 137 140  K 4.1 4.4 4.7 5.2* 4.7  CL 95* 98* 99* 96* 100*  CO2 33* 35* 38* 32 36*  GLUCOSE 161* 139* 200* 261* 236*  BUN 24* 20 22* 22* 28*  CREATININE 0.52 0.50 0.55 0.53 0.51  CALCIUM 8.8* 8.7* 8.8* 9.0 8.9  MG 2.1 2.2 2.2  --   --    Liver Function Tests:  Recent Labs Lab 12/22/14 0240 12/23/14 0310 12/24/14 0410  AST 18 18 20   ALT 19 20 23   ALKPHOS 48 49 49  BILITOT 0.4 0.2* 0.1*  PROT 5.4* 5.8* 5.7*  ALBUMIN 3.1* 3.0* 3.0*   Coagulation profile  Recent Labs Lab 12/20/14 1910  INR 1.05    CBC:  Recent Labs Lab 12/20/14 1350 12/22/14 0240 12/23/14 0310 12/24/14 0410 12/25/14 0254  WBC 12.4* 17.8* 11.7* 15.5* 10.6*  NEUTROABS 11.3* 16.8* 10.9* 14.2*  --   HGB 11.5* 11.7* 11.5* 11.0* 11.2*  HCT 37.6 38.0 37.0 36.5 36.9  MCV 93.3 93.1 93.4 94.1 93.9  PLT 340 332 330 383 358   CBG:  Recent Labs Lab 12/26/14 0732 12/26/14 1143 12/26/14 1704 12/26/14 2136 12/27/14 0753  GLUCAP 299* 262* 177* 199* 218*   Sepsis Labs:  Recent Labs Lab 12/22/14 0240 12/23/14 0310 12/24/14 0410 12/25/14 0254  WBC 17.8* 11.7* 15.5* 10.6*  LATICACIDVEN 0.9  --   --   --    Microbiology Recent Results (from the past 240 hour(s))    MRSA PCR Screening     Status: None   Collection Time: 12/21/14  1:34 AM  Result Value Ref Range Status   MRSA by PCR NEGATIVE NEGATIVE Final    Comment:        The GeneXpert MRSA Assay (FDA approved for NASAL specimens only), is one component of a comprehensive MRSA colonization surveillance program. It is not intended to diagnose MRSA infection nor to guide or monitor treatment for MRSA infections.      Medications:   . antiseptic oral rinse  7 mL Mouth Rinse BID  . arformoterol  15 mcg Nebulization BID  . budesonide (PULMICORT) nebulizer solution  0.5 mg Nebulization BID  . clopidogrel  75 mg Oral q morning - 10a  . diltiazem  30 mg Oral 3 times per day  . docusate sodium  100 mg Oral QHS  . enoxaparin (LOVENOX) injection  30 mg Subcutaneous Q24H  . feeding supplement (GLUCERNA SHAKE)  237 mL Oral TID BM  . guaiFENesin  1,200 mg Oral Daily  . insulin aspart  0-20 Units Subcutaneous TID WC  . insulin aspart  0-5 Units Subcutaneous QHS  . insulin aspart  6 Units Subcutaneous TID WC  . insulin NPH Human  10 Units Subcutaneous BID AC & HS  . methylPREDNISolone (SOLU-MEDROL) injection  40 mg Intravenous 3 times per day  . pravastatin  80 mg Oral QPM  . roflumilast  500 mcg Oral Daily  . tiotropium  18 mcg Inhalation Daily   Time spent: 35 minutes.  The patient is medically complex with multiple co-morbidities and is at high risk for clinical deterioration and requires high complexity decision making.   LOS: 6 days   Cherene Altes, MD [facility] For Consults/Admissions - Flow Manager [phone] Office  [phone]  Contact MD directly via text page:      [email or web address]      password [facility]  12/27/2014, 9:59 AM

## 2014-12-27 NOTE — Clinical Social Work Note (Signed)
BSW intern went to patient room to complete assessment. Patient was on biPAP and unable to hear well and communicate effectively. BSW intern asked patient if she was willing to go to a SNF and she said she would think about it. BSW intern asked patient if there was a family member that could be contacted. Patient refused. CSW to complete full assessment with patient at a later date.  Raynelle Highland BSW Intern, [id number]

## 2014-12-27 NOTE — Progress Notes (Signed)
PULMONARY / CRITICAL CARE MEDICINE   Name: Judith Boyer MRN: [id number] DOB: [birth year]    ADMISSION DATE:  12/20/2014 CONSULTATION DATE:  12/25/14  REFERRING MD :  TRH/Rizwan   CHIEF COMPLAINT:  COPD exacerbation    69 yo former smoker with severe COPD (FEV1 33%) presented with productive cough and dyspnea. She was started on tx for AECOPD, but was slow to improve. MW pt  SUBJECTIVE:  Still very short of breath.on walking around the bed Slept on bipap  VITAL SIGNS: BP 117/58 mmHg  Pulse 91  Temp(Src) 97.3 F (36.3 C) (Oral)  Resp 18  Ht 5\' 1"  (1.549 m)  Wt 44.7 kg (98 lb 8.7 oz)  BMI 18.63 kg/m2  SpO2 95%  INTAKE / OUTPUT: I/O last 3 completed shifts: In: 480 [P.O.:480] Out: 1725 [Urine:1725]  PHYSICAL EXAMINATION: General: sitting in chair HEENT: purse lip breathing Cardiac: regular, tachycardic Chest: poor air movement, faint wheeze Abd: soft, non tender Ext: 1+ edema Neuro: normal strength   LABS:  CBC  Recent Labs Lab 12/23/14 0310 12/24/14 0410 12/25/14 0254  WBC 11.7* 15.5* 10.6*  HGB 11.5* 11.0* 11.2*  HCT 37.0 36.5 36.9  PLT 330 383 358   Coag's  Recent Labs Lab 12/20/14 1910  APTT 24  INR 1.05   BMET  Recent Labs Lab 12/24/14 0410 12/25/14 0254 12/26/14 0335  NA 141 137 140  K 4.7 5.2* 4.7  CL 99* 96* 100*  CO2 38* 32 36*  BUN 22* 22* 28*  CREATININE 0.55 0.53 0.51  GLUCOSE 200* 261* 236*   Electrolytes  Recent Labs Lab 12/22/14 0240 12/23/14 0310 12/24/14 0410 12/25/14 0254 12/26/14 0335  CALCIUM 8.8* 8.7* 8.8* 9.0 8.9  MG 2.1 2.2 2.2  --   --    Sepsis Markers  Recent Labs Lab 12/22/14 0240  LATICACIDVEN 0.9   ABG  Recent Labs Lab 12/21/14 2124 12/22/14 0346 12/25/14 1227  PHART 7.382 7.384 7.391  PCO2ART 61.0* 55.8* 59.7*  PO2ART 85.0 81.0 74.0*   Liver Enzymes  Recent Labs Lab 12/22/14 0240 12/23/14 0310 12/24/14 0410  AST 18 18 20   ALT 19 20 23   ALKPHOS 48 49 49  BILITOT  0.4 0.2* 0.1*  ALBUMIN 3.1* 3.0* 3.0*   Cardiac Enzymes No results for input(s): TROPONINI, PROBNP in the last 168 hours. Glucose  Recent Labs Lab 12/25/14 2149 12/26/14 0732 12/26/14 1143 12/26/14 1704 12/26/14 2136 12/27/14 0753  GLUCAP 281* 299* 262* 177* 199* 218*    Imaging No results found.  STUDIES:   CULTURES:  ANTIBIOTICS: Levaquin 11/14 >> 11/19   SIGNIFICANT EVENTS: 11/14 Admit  LINES/TUBES:  DISCUSSION: 69 yo female with severe COPD admitted with exacerbation, and slow to improve.  ASSESSMENT / PLAN:  Acute on chronic hypoxic/hypercapnic respiratory failure 2nd to AECOPD. 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 Plan: - oxygen to keep SpO2 88 to 95% - BiPAP qhs and prn during the day - continue pulmicort, brovana, spiriva - continue solumedrol 40 q8 - add daliresp 11/20 -dc spiriva, start duonebs 4/d - prn morphine for dyspnea - bronchial hygiene   Anxiety, insomnia. Plan: - prn xanax, ativan - ct  trazodone qhs prn  Goals of Care. Plan: - DNI - Initiate palliative  assessment   ALVA,RAKESH V. MD [phone]  12/27/2014, 10:24 AM

## 2014-12-28 DIAGNOSIS — I10 Essential (primary) hypertension: Secondary | ICD-10-CM

## 2014-12-28 DIAGNOSIS — F419 Anxiety disorder, unspecified: Secondary | ICD-10-CM

## 2014-12-28 LAB — GLUCOSE, CAPILLARY
GLUCOSE-CAPILLARY: 175 mg/dL — AB (ref 65–99)
Glucose-Capillary: 269 mg/dL — ABNORMAL HIGH (ref 65–99)

## 2014-12-28 MED ORDER — INSULIN NPH (HUMAN) (ISOPHANE) 100 UNIT/ML ~~LOC~~ SUSP
15.0000 [IU] | Freq: Two times a day (BID) | SUBCUTANEOUS | Status: DC
  Administered 2014-12-28 – 2014-12-31 (×5): 15 [IU] via SUBCUTANEOUS

## 2014-12-28 MED ORDER — LEVALBUTEROL HCL 1.25 MG/0.5ML IN NEBU
1.2500 mg | INHALATION_SOLUTION | Freq: Four times a day (QID) | RESPIRATORY_TRACT | Status: DC | PRN
  Administered 2014-12-28 – 2014-12-31 (×6): 1.25 mg via RESPIRATORY_TRACT
  Filled 2014-12-28 (×7): qty 0.5

## 2014-12-28 MED ORDER — METHYLPREDNISOLONE SODIUM SUCC 40 MG IJ SOLR
40.0000 mg | Freq: Two times a day (BID) | INTRAMUSCULAR | Status: DC
  Administered 2014-12-28 – 2014-12-31 (×6): 40 mg via INTRAVENOUS
  Filled 2014-12-28 (×6): qty 1

## 2014-12-28 MED ORDER — DILTIAZEM HCL 30 MG PO TABS
45.0000 mg | ORAL_TABLET | Freq: Three times a day (TID) | ORAL | Status: DC
  Administered 2014-12-28 – 2014-12-29 (×3): 45 mg via ORAL
  Filled 2014-12-28 (×3): qty 2

## 2014-12-28 MED ORDER — DILTIAZEM HCL 30 MG PO TABS
15.0000 mg | ORAL_TABLET | Freq: Once | ORAL | Status: AC
  Administered 2014-12-28: 15 mg via ORAL
  Filled 2014-12-28: qty 1

## 2014-12-28 NOTE — Clinical Social Work Placement (Signed)
   CLINICAL SOCIAL WORK PLACEMENT  NOTE  Date:  12/28/2014  Patient Details  Name: Judith Boyer MRN: [id number] Date of Birth: April 10, 1945  Clinical Social Work is seeking post-discharge placement for this patient at the [place] level of care (*CSW will initial, date and re-position this form in  chart as items are completed):  Yes   Patient/family provided with [place] Work Department's list of facilities offering this level of care within the geographic area requested by the patient (or if unable, by the patient's family).  Yes   Patient/family informed of their freedom to choose among providers that offer the needed level of care, that participate in Medicare, Medicaid or managed care program needed by the patient, have an available bed and are willing to accept the patient.  Yes   Patient/family informed of [facility]'s ownership interest in [facility] and [facility], as well as of the fact that they are under no obligation to receive care at these facilities.  PASRR submitted to EDS on  12/23/14     PASRR number received on 12/23/14     Existing PASRR number confirmed on       FL2 transmitted to all facilities in geographic area requested by pt/family on 12/28/14     FL2 transmitted to all facilities within larger geographic area on       Patient informed that his/her managed care company has contracts with or will negotiate with certain facilities, including the following:            Patient/family informed of bed offers received.  Patient chooses bed at       Physician recommends and patient chooses bed at      Patient to be transferred to   on  .  Patient to be transferred to facility by       Patient family notified on   of transfer.  Name of family member notified:        PHYSICIAN Please sign FL2, Please prepare prescriptions     Additional Comment:     _______________________________________________  Tedd Sias Intern, [id number]

## 2014-12-28 NOTE — Progress Notes (Signed)
Patient was sitting at the bedside commode very uncomfortable verbalized that the stool is already by her rectum but it won't come out. Had senna-kot last night since patient no bowel movement for several days. Judith Boyer was notified with order for disimpaction. Placed patient back to bed and explained the procedure to patient with reassurance. Disimpacted moderate amount hard dark brownish stool pellets like. After disimpaction patient felt better, was able to go again in the commode with small hard stool. Will continue to monitor and follow right bowel regimen with MD this morning.

## 2014-12-28 NOTE — Progress Notes (Signed)
PULMONARY / CRITICAL CARE MEDICINE   Name: ANIS LEVENSTEIN MRN: [id number] DOB: 28-Mar-1945    ADMISSION DATE:  12/20/2014 CONSULTATION DATE:  12/25/14  REFERRING MD :  TRH/Rizwan   CHIEF COMPLAINT:  COPD exacerbation    69 yo former smoker with severe COPD (FEV1 33%) presented with productive cough and dyspnea. She was started on tx for AECOPD, but was slow to improve. MW pt  SUBJECTIVE:  Still very short of breath C/o constipation Stayed off bipap  VITAL SIGNS: BP 123/61 mmHg  Pulse 105  Temp(Src) 97.9 F (36.6 C) (Oral)  Resp 21  Ht 5\' 1"  (1.549 m)  Wt 98 lb 8.7 oz (44.7 kg)  BMI 18.63 kg/m2  SpO2 100%  INTAKE / OUTPUT: I/O last 3 completed shifts: In: 840 [P.O.:840] Out: 725 [Urine:725]  PHYSICAL EXAMINATION: General: sitting in chair, chr ill appearing HEENT: purse lip breathing Cardiac: regular, tachycardic Chest: poor air movement, faint wheeze Abd: soft, non tender Ext: 1+ edema Neuro: normal strength   LABS:  CBC  Recent Labs Lab 12/23/14 0310 12/24/14 0410 12/25/14 0254  WBC 11.7* 15.5* 10.6*  HGB 11.5* 11.0* 11.2*  HCT 37.0 36.5 36.9  PLT 330 383 358   Coag's No results for input(s): APTT, INR in the last 168 hours. BMET  Recent Labs Lab 12/24/14 0410 12/25/14 0254 12/26/14 0335  NA 141 137 140  K 4.7 5.2* 4.7  CL 99* 96* 100*  CO2 38* 32 36*  BUN 22* 22* 28*  CREATININE 0.55 0.53 0.51  GLUCOSE 200* 261* 236*   Electrolytes  Recent Labs Lab 12/22/14 0240 12/23/14 0310 12/24/14 0410 12/25/14 0254 12/26/14 0335  CALCIUM 8.8* 8.7* 8.8* 9.0 8.9  MG 2.1 2.2 2.2  --   --    Sepsis Markers  Recent Labs Lab 12/22/14 0240  LATICACIDVEN 0.9   ABG  Recent Labs Lab 12/21/14 2124 12/22/14 0346 12/25/14 1227  PHART 7.382 7.384 7.391  PCO2ART 61.0* 55.8* 59.7*  PO2ART 85.0 81.0 74.0*   Liver Enzymes  Recent Labs Lab 12/22/14 0240 12/23/14 0310 12/24/14 0410  AST 18 18 20   ALT 19 20 23   ALKPHOS 48 49  49  BILITOT 0.4 0.2* 0.1*  ALBUMIN 3.1* 3.0* 3.0*   Cardiac Enzymes No results for input(s): TROPONINI, PROBNP in the last 168 hours. Glucose  Recent Labs Lab 12/26/14 1704 12/26/14 2136 12/27/14 0753 12/27/14 1308 12/27/14 1639 12/28/14 0803  GLUCAP 177* 199* 218* 164* 137* 175*    Imaging No results found.  STUDIES:   CULTURES:  ANTIBIOTICS: Levaquin 11/14 >> 11/19   SIGNIFICANT EVENTS: 11/14 Admit  LINES/TUBES:  DISCUSSION: 69 yo female with severe COPD admitted with exacerbation, and slow to improve.  ASSESSMENT / PLAN:  Acute on chronic hypoxic/hypercapnic respiratory failure 2nd to AECOPD. 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 Plan: - oxygen to keep SpO2 88 to 95% - BiPAP qhs and prn during the day - continue pulmicort, brovana, spiriva - drop solumedrol 40 q12 - add daliresp 11/20 -dc spiriva, start duonebs 4/d - prn morphine for dyspnea - bronchial hygiene   Anxiety, insomnia. Plan: - prn xanax, ativan - ct  trazodone qhs prn  Goals of Care. Plan: - DNI - Initiate palliative  assessment -SNF recommended  PCCM available as needed  Rigoberto Noel. MD [phone]  12/28/2014, 9:47 AM

## 2014-12-28 NOTE — Progress Notes (Signed)
[facility] TEAM 1 - Stepdown/ICU TEAM Progress Note  ELICE MATSUNO [id number] DOB: 1945/06/20 DOA: 12/20/2014 PCP: Jenny Reichmann, MD  Admit HPI / Brief Narrative: 69 y.o. WF PMHx COPD on 2 L O2 at home, HTN, CAD native artery, HLD, DM Type 2.  NOTE; patient last seen by Dr.Michael B Wert [facility] M) 10/28 for same symptoms. Patient was treated at that time with antibiotics, [name], DuoNeb, and [name].   Patient was recently hospitalized with acute on chronic respiratory failure. Her breathing was slow to improve and given advanced disease Palliative Medicine was consulted. Palliative care team attempted to discuss CODE STATUS with the patient on multiple occasions but she apparently refused. Patient has been on a tapering dose of steroids since discharge in August, currently taking 20mg  of prednisone daily. She is worried about blood sugars being high.   Patient has been becoming increasingly short of breath at home over the last week. Pulmonary gave her an office appointment for Thursday but patient felt she could not wait. Patient has been coughing up excessive amount of clear mucus. She feels her chest is very congested despite twice-daily Mucinex. Hears herself wheezing. No fevers. She was unable to use inhaler at home today due to significant shortness of breath. No complaints of peripheral edema.    HPI/Subjective: 11/22 A/O x4. States having meeting in Am w/ Family and CSW to discuss D/C. States stayed off BIPAP all yesterday and was able to ambulate to bathroom and back w/walker. Currently on BIPAP for comfort.      Assessment/Plan:  COPD exacerbation ([place])./Acute on chronic respiratory failure with hypoxia -Continue nebulizers -IV Solu-Medrol 60 mg BID -Continue Mucinex twice daily -Brovana BID -Pulmicort BID -Xopenex  PRN -Physiotherapy Vest BID -Continue Oxygen therapy.at 3L; titrate to maintain SPO2 89-93%  -Continue Xanax TID daily -helps breathing -Morphine 1 mg  PRN feeling SOB -PT/OT; recommends SNF  Sinus Tachycardia/HTN -Increase Cardizam 45  mg TID  Noncompliance medication -See history of present illness  Uncontrolled diabetes mellitus type 2 uncontrolled -10/13 hemoglobin A1c= 8.3 -Monitor CBGs closely on Solu-Medrol -Increase NPH to 15 units BID -Continue NovoLog 8 units QAC -Resistant SSI -Nothing by mouth until patient can stay off of BiPAP   CAD, s/p stenting 2009.  -On chronic Plavix. Echocardiogram January 2015. LDL ejection fraction 55-60%  Tobacco abuse -Spoke at length to patient detailing absolute need to discontinue tobacco, as the result would be death   Goals of care -Had a long meeting with family members (son/wife, husband) and explained the poor prognosis of patient given her continued increasing SOB although on steroids, and patient's unwillingness to use medication prescribed by pulmonologist(Tudorza).  -Per son when initially usingTudorza breathing improved however patient did not like the feeling she had in her chest post use. Per Dr. Melvyn Novas note she stated couldn't afford it. Patient also stopped Spiriva; could not urinate Unwillingness to stop smoking. -Counseled family that patient had poor prognosis of leaving hospital. -Counseled family that if patient was intubated probably would not be able to be extubated. Family to discuss change of CODE STATUS with patient and inform staff. -11/22 family and CSW to discuss discharge to SNF in A.m.   Code Status: DO NOT RESUSCITATE Family Communication: family present at time of exam Disposition Plan: Pulmonary rehabilitation vs SNF    Consultants: Alexia Freestone [facility] M  Procedure/Significant Events: NA   Culture 11/15 MRSA by PCR negative 11/15 strep pneumo urine antigen pending 11/15 Legionella urine antigen pending 11/15  sputum pending   Antibiotics: Levofloxacin 11/14>> 11/18   DVT prophylaxis: Lovenox   Devices    LINES / TUBES:       Continuous Infusions:   Objective: VITAL SIGNS: Temp: 97.8 F (36.6 C) (11/22 1140) Temp Source: Oral (11/22 1140) BP: 122/80 mmHg (11/22 1550) Pulse Rate: 103 (11/22 1550) SPO2; FIO2:   Intake/Output Summary (Last 24 hours) at 12/28/14 1601 Last data filed at 12/28/14 1300  Gross per 24 hour  Intake    987 ml  Output    375 ml  Net    612 ml     Exam: General: Now on BiPAP able to speak in short full sentences, Acute on Chronic respiratory distress(but improving) Eyes: Negative headache, ,negative scleral hemorrhage ENT: Negative Runny nose, negative gingival bleeding, Neck:  Negative scars, masses, torticollis, lymphadenopathy, JVD Lungs: Poor air movement all fields, negative wheezes, negative crackles Cardiovascular: Tachycardic, Regular rhythm without murmur gallop or rub normal S1 and S2 Abdomen:negative abdominal pain, nondistended, positive soft, bowel sounds, no rebound, no ascites, no appreciable mass Extremities: No significant cyanosis, clubbing, or edema bilateral lower extremities Psychiatric:  Positive Anxiety,  Neurologic:  Cranial nerves II through XII intact, tongue/uvula midline, all extremities muscle strength 5/5, sensation intact throughout, negative dysarthria, negative expressive aphasia, negative receptive aphasia.   Data Reviewed: Basic Metabolic Panel:  Recent Labs Lab 12/22/14 0240 12/23/14 0310 12/24/14 0410 12/25/14 0254 12/26/14 0335  NA 139 140 141 137 140  K 4.1 4.4 4.7 5.2* 4.7  CL 95* 98* 99* 96* 100*  CO2 33* 35* 38* 32 36*  GLUCOSE 161* 139* 200* 261* 236*  BUN 24* 20 22* 22* 28*  CREATININE 0.52 0.50 0.55 0.53 0.51  CALCIUM 8.8* 8.7* 8.8* 9.0 8.9  MG 2.1 2.2 2.2  --   --    Liver Function Tests:  Recent Labs Lab 12/22/14 0240 12/23/14 0310 12/24/14 0410  AST 18 18 20   ALT 19 20 23   ALKPHOS 48 49 49  BILITOT 0.4 0.2* 0.1*  PROT 5.4* 5.8* 5.7*  ALBUMIN 3.1* 3.0* 3.0*   No results for input(s): LIPASE,  AMYLASE in the last 168 hours. No results for input(s): AMMONIA in the last 168 hours. CBC:  Recent Labs Lab 12/22/14 0240 12/23/14 0310 12/24/14 0410 12/25/14 0254  WBC 17.8* 11.7* 15.5* 10.6*  NEUTROABS 16.8* 10.9* 14.2*  --   HGB 11.7* 11.5* 11.0* 11.2*  HCT 38.0 37.0 36.5 36.9  MCV 93.1 93.4 94.1 93.9  PLT 332 330 383 358   Cardiac Enzymes: No results for input(s): CKTOTAL, CKMB, CKMBINDEX, TROPONINI in the last 168 hours. BNP (last 3 results)  Recent Labs  06/18/14 1740 08/13/14 1320 12/25/14 1210  BNP 12.2 12.0 45.6    ProBNP (last 3 results) No results for input(s): PROBNP in the last 8760 hours.  CBG:  Recent Labs Lab 12/27/14 0753 12/27/14 1308 12/27/14 1639 12/28/14 0803 12/28/14 1141  GLUCAP 218* 164* 137* 175* 269*    Recent Results (from the past 240 hour(s))  MRSA PCR Screening     Status: None   Collection Time: 12/21/14  1:34 AM  Result Value Ref Range Status   MRSA by PCR NEGATIVE NEGATIVE Final    Comment:        The GeneXpert MRSA Assay (FDA approved for NASAL specimens only), is one component of a comprehensive MRSA colonization surveillance program. It is not intended to diagnose MRSA infection nor to guide or monitor treatment for MRSA infections.  Studies:  Recent x-ray studies have been reviewed in detail by the Attending Physician  Scheduled Meds:  Scheduled Meds: . antiseptic oral rinse  7 mL Mouth Rinse BID  . arformoterol  15 mcg Nebulization BID  . budesonide (PULMICORT) nebulizer solution  0.5 mg Nebulization BID  . clopidogrel  75 mg Oral q morning - 10a  . diltiazem  30 mg Oral 3 times per day  . enoxaparin (LOVENOX) injection  30 mg Subcutaneous Q24H  . feeding supplement (GLUCERNA SHAKE)  237 mL Oral TID BM  . guaiFENesin  1,200 mg Oral Daily  . insulin aspart  0-20 Units Subcutaneous TID WC  . insulin aspart  0-5 Units Subcutaneous QHS  . insulin aspart  8 Units Subcutaneous TID WC  . insulin NPH  Human  10 Units Subcutaneous BID AC & HS  . methylPREDNISolone (SOLU-MEDROL) injection  40 mg Intravenous Q12H  . pravastatin  80 mg Oral QPM  . roflumilast  500 mcg Oral Daily  . senna-docusate  2 tablet Oral QHS    Time spent on care of this patient: 40 mins   WOODS, Geraldo Docker , MD  [facility] Office  [phone] Pager - [phone]  On-Call/Text Page:      [email or web address]      password [email or web address]  If 7PM-7AM, please contact night-coverage [email or web address] Password [email or web address] 12/28/2014, 4:01 PM   LOS: 7 days   Care during the described time interval was provided by me .  I have reviewed this patient's available data, including medical history, events of note, physical examination, and all test results as part of my evaluation. I have personally reviewed and interpreted all radiology studies.   Dia Crawford, MD [phone] Pager

## 2014-12-28 NOTE — Progress Notes (Signed)
OT Cancellation Note  Patient Details Name: Judith Boyer MRN: [id number] DOB: 05/28/1945   Cancelled Treatment:    Reason Eval/Treat Not Completed: Medical issues which prohibited therapy. Pt's HR at rest 129 and pt struggling with breathing--RN made aware.  Almon Register [id number] 12/28/2014, 12:13 PM

## 2014-12-28 NOTE — Clinical Social Work Note (Signed)
CSW received call from RN asking which SNF patient will DC today. CSW explained to RN that social work has attempted to meet with patient multiple times regarding SNF placement but patient has either asked CSW to leave, or CSW has been asked to leave by others involved in patient's care. As a result, SNF placement process will be started once patient is assessed and patient has given permission for referrals to be made. CSW will attempt to meet with patient again today in regards to SNF placement.   Liz Beach MSW, [place], [place], [id number]

## 2014-12-28 NOTE — Progress Notes (Signed)
Nutrition Follow-up  DOCUMENTATION CODES:   Severe malnutrition in context of chronic illness  INTERVENTION:   -Continue Glucerna Shake po TID, each supplement provides 220 kcal and 10 grams of protein  NUTRITION DIAGNOSIS:   Inadequate oral intake related to poor appetite as evidenced by meal completion < 50%.  Progressing  GOAL:   Patient will meet greater than or equal to 90% of their needs  Progressing  MONITOR:   PO intake, Supplement acceptance, Labs, Weight trends, Skin, I & O's  REASON FOR ASSESSMENT:   Consult Assessment of nutrition requirement/status  ASSESSMENT:   Judith Boyer is a 69 y.o. female with a Past Medical History of COPD, HTN, CAD, HLD, MI, DM, anemia who presents with COPD exacerbation.   Pt using bedside commode at time of visit.   Intake and supplement acceptance has improved since last visit. Noted meal completion 50-100% and pt is accepeting Glucerna shake supplements.   Pt remains on IV solumedrol. DM coordinator continues to follow for glycemic control recommendations.   Palliative care consult pending.   Labs reviewed: CBGS: [id number].   Diet Order:  Diet Carb Modified Fluid consistency:: Thin; Room service appropriate?: Yes  Skin:  Reviewed, no issues  Last BM:  11/18 small/hard  Height:   Ht Readings from Last 1 Encounters:  12/21/14 5\' 1"  (1.549 m)    Weight:   Wt Readings from Last 1 Encounters:  12/21/14 98 lb 8.7 oz (44.7 kg)    Ideal Body Weight:  47.7 kg  BMI:  Body mass index is 18.63 kg/(m^2).  Estimated Nutritional Needs:   Kcal:  1300-1500  Protein:  55-65 grams  Fluid:  1.3-1.5 L  EDUCATION NEEDS:   Education needs addressed  Anzel Kearse A. Jimmye Norman, RD, LDN, CDE Pager: [phone] After hours Pager: [phone]

## 2014-12-28 NOTE — Progress Notes (Signed)
HR went up to 120's-130's sinus tach, bp 122/60 claimed that if her blood sugar is up it causes her heart rate to go up. cbg-269 -11 units novolog given, cardizem 30 mg po given as sched. med, xanax 0.5 mg po given. MD made aware. with orde.r Latest hr- 113, claimed she feels much better. Continue to monitor.

## 2014-12-28 NOTE — Progress Notes (Signed)
Patient not wanting to go on CPAP at this time due to her coughing. Patient stated she will have RN contact RT if and when she is ready.

## 2014-12-28 NOTE — NC FL2 (Signed)
[place] LEVEL OF CARE SCREENING TOOL     IDENTIFICATION  Patient Name: Judith Boyer Birthdate: 1946-01-03 Sex: female Admission Date (Current Location): 12/20/2014  [facility] and [place] Number: Herbalist and Address:  [facility], [place] [address] [place], [place] [address]      Provider Number: [id number]  Attending Physician Name and Address:  Allie Bossier, MD  Relative Name and Phone Number:  Shavell Bowdish [phone])    Current Level of Care: Hospital Recommended Level of Care: [place] Prior Approval Number:    Date Approved/Denied:   PASRR Number:   [id number]  Discharge Plan: SNF    Current Diagnoses: Patient Active Problem List   Diagnosis Date Noted  . Hyperkalemia 12/25/2014  . Tobacco abuse   . Noncompliance   . Acute on chronic respiratory failure with hypoxia ([place])   . Acute exacerbation of chronic obstructive pulmonary disease (COPD) ([place])   . Diabetes mellitus with complication ([place])   . Chronic respiratory failure with hypercapnia ([place]) 09/24/2014  . Palliative care by specialist   . Dysphagia   . Dyspnea 08/13/2014  . Tachycardia   . Respiratory acidosis   . Coronary artery disease involving native coronary artery of native heart without angina pectoris   . Anxiety   . B12 deficiency 02/10/2014  . Protein-calorie malnutrition, severe ([place]) 02/09/2014  . Acute respiratory distress ([facility]) 02/08/2014  . Vitamin B 12 deficiency 06/28/2013  . Palliative care encounter 02/25/2013  . Weakness generalized 02/25/2013  . COPD exacerbation ([place]) 02/20/2013  . Anemia 02/20/2013  . Nocturnal hypoxemia 12/30/2012  . COPD GOLD III 02/06/2011  . Smoker 02/06/2011  . Hypertension 02/06/2011  . Diabetes mellitus type 2, controlled, without complications ([place]) [id number]    Orientation ACTIVITIES/SOCIAL BLADDER RESPIRATION    Self, Time, Situation, Place  Active Continent O2  (As needed) (Continuous 3L)  BEHAVIORAL SYMPTOMS/MOOD NEUROLOGICAL BOWEL NUTRITION STATUS      Continent Diet (Carb Modified)  PHYSICIAN VISITS COMMUNICATION OF NEEDS Height & Weight Skin  30 days Verbally 5\' 1"  (154.9 cm) 98 lbs. Normal          AMBULATORY STATUS RESPIRATION    Supervision limited O2 (As needed) (Continuous 3L)      Personal Care Assistance Level of Assistance   (Limited Assistance) Bathing Assistance: Limited assistance Feeding assistance: Independent Dressing Assistance: Limited assistance      Functional Limitations Info  Sight, Hearing, Speech Sight Info: Adequate Hearing Info: Adequate Speech Info: Adequate       SPECIAL CARE FACTORS FREQUENCY  PT (By licensed PT), OT (By licensed OT)     PT Frequency: 5x/week OT Frequency: 5x/week           Additional Factors Info  Code Status, Allergies, Insulin Sliding Scale Code Status Info: PARTIAL CODE Allergies Info: Doxycycline, Codeine, Dulera, Adhesive, Latex, Lisinopril   Insulin Sliding Scale Info: 3x/day 0-20 units       Current Medications (12/28/2014): Current Facility-Administered Medications  Medication Dose Route Frequency Provider Last Rate Last Dose  . acetaminophen (TYLENOL) tablet 650 mg  650 mg Oral Q6H PRN Willia Craze, NP       Or  . acetaminophen (TYLENOL) suppository 650 mg  650 mg Rectal Q6H PRN Willia Craze, NP      . ALPRAZolam Duanne Moron) tablet 0.5 mg  0.5 mg Oral Q6H PRN Chesley Mires, MD   0.5 mg at 12/28/14 1219  . antiseptic oral rinse (  CPC / CETYLPYRIDINIUM CHLORIDE 0.05%) solution 7 mL  7 mL Mouth Rinse BID Willia Craze, NP   7 mL at 12/28/14 1000  . arformoterol (BROVANA) nebulizer solution 15 mcg  15 mcg Nebulization BID Allie Bossier, MD   15 mcg at 12/28/14 [phone]  . benzonatate (TESSALON) capsule 100 mg  100 mg Oral TID PRN Willia Craze, NP   100 mg at 12/27/14 2349  . bisacodyl (DULCOLAX) suppository 10 mg  10 mg Rectal Daily PRN Gardiner Barefoot,  NP   10 mg at 12/28/14 0359  . budesonide (PULMICORT) nebulizer solution 0.5 mg  0.5 mg Nebulization BID Chesley Mires, MD   0.5 mg at 12/28/14 0826  . clopidogrel (PLAVIX) tablet 75 mg  75 mg Oral q morning - 10a Willia Craze, NP   75 mg at 12/28/14 0950  . diltiazem (CARDIZEM) tablet 30 mg  30 mg Oral 3 times per day Allie Bossier, MD   30 mg at 12/28/14 1215  . enoxaparin (LOVENOX) injection 30 mg  30 mg Subcutaneous Q24H Lyndee Leo, RPH   30 mg at 12/27/14 2158  . feeding supplement (GLUCERNA SHAKE) (GLUCERNA SHAKE) liquid 237 mL  237 mL Oral TID BM Jenifer A Williams, RD   237 mL at 12/28/14 1412  . guaiFENesin (MUCINEX) 12 hr tablet 1,200 mg  1,200 mg Oral Daily Cherene Altes, MD   1,200 mg at 12/28/14 0950  . insulin aspart (novoLOG) injection 0-20 Units  0-20 Units Subcutaneous TID WC Venetia Maxon Rama, MD   11 Units at 12/28/14 1214  . insulin aspart (novoLOG) injection 0-5 Units  0-5 Units Subcutaneous QHS Venetia Maxon Rama, MD   3 Units at 12/25/14 2223  . insulin aspart (novoLOG) injection 8 Units  8 Units Subcutaneous TID WC Cherene Altes, MD   8 Units at 12/28/14 1338  . insulin NPH Human (HUMULIN N,NOVOLIN N) injection 10 Units  10 Units Subcutaneous BID AC & HS Venetia Maxon Rama, MD   10 Units at 12/28/14 1108  . levalbuterol (XOPENEX) nebulizer solution 1.25 mg  1.25 mg Nebulization Q6H PRN Allie Bossier, MD      . LORazepam (ATIVAN) injection 1 mg  1 mg Intravenous Q4H PRN Venetia Maxon Rama, MD   1 mg at 12/27/14 0316  . methylPREDNISolone sodium succinate (SOLU-MEDROL) 40 mg/mL injection 40 mg  40 mg Intravenous Q12H Kara Mead V, MD      . morphine 2 MG/ML injection 1-2 mg  1-2 mg Intravenous Q4H PRN Chesley Mires, MD   2 mg at 12/26/14 1052  . nitroGLYCERIN (NITROSTAT) SL tablet 0.4 mg  0.4 mg Sublingual Q5 Min x 3 PRN Willia Craze, NP      . ondansetron [facility]) tablet 4 mg  4 mg Oral Q6H PRN Willia Craze, NP   4 mg at 12/26/14 2201   Or  . ondansetron  (ZOFRAN) injection 4 mg  4 mg Intravenous Q6H PRN Willia Craze, NP   4 mg at 12/25/14 0947  . pravastatin (PRAVACHOL) tablet 80 mg  80 mg Oral QPM Willia Craze, NP   80 mg at 12/27/14 1814  . roflumilast (DALIRESP) tablet 500 mcg  500 mcg Oral Daily Chesley Mires, MD   500 mcg at 12/28/14 0951  . senna-docusate (Senokot-S) tablet 2 tablet  2 tablet Oral QHS Gardiner Barefoot, NP   2 tablet at 12/27/14 2330  . sodium phosphate (FLEET) 7-19 GM/118ML enema  1 enema  1 enema Rectal Q3 days PRN Gardiner Barefoot, NP      . traZODone (DESYREL) tablet 100 mg  100 mg Oral QHS PRN Chesley Mires, MD   100 mg at 12/26/14 2331   Do not use this list as official medication orders. Please verify with discharge summary.  Discharge Medications:   Medication List    ASK your doctor about these medications        albuterol 108 (90 BASE) MCG/ACT inhaler  Commonly known as:  PROAIR HFA  Inhale 2 puffs into the lungs every 4 (four) hours as needed for wheezing or shortness of breath.     albuterol (2.5 MG/3ML) 0.083% nebulizer solution  Commonly known as:  PROVENTIL  Take 3 mLs (2.5 mg total) by nebulization every 4 (four) hours as needed for wheezing or shortness of breath.     ALPRAZolam 0.25 MG tablet  Commonly known as:  XANAX  Take 1 tablet (0.25 mg total) by mouth 2 (two) times daily.     budesonide-formoterol 160-4.5 MCG/ACT inhaler  Commonly known as:  SYMBICORT  Take 2 puffs first thing in am and then another 2 puffs about 12 hours later.     clopidogrel 75 MG tablet  Commonly known as:  PLAVIX  Take 1 tablet (75 mg total) by mouth every morning.     Iron Tabs  Take 1 tablet by mouth daily.     metFORMIN 500 MG tablet  Commonly known as:  GLUCOPHAGE  Take 2 tablets (1,000 mg total) by mouth 2 (two) times daily with a meal.     montelukast 10 MG tablet  Commonly known as:  SINGULAIR  take 1 tablet by mouth once daily     MUCINEX MAXIMUM STRENGTH 1200 MG Tb12  Generic drug:   Guaifenesin  Take 1,200 mg by mouth 2 (two) times daily.     nitroGLYCERIN 0.4 MG SL tablet  Commonly known as:  NITROSTAT  Place 1 tablet (0.4 mg total) under the tongue every 5 (five) minutes x 3 doses as needed for chest pain.     NOVOLIN N 100 UNIT/ML injection  Generic drug:  insulin NPH Human  Inject 14 Units into the skin daily.     OXYGEN  Inhale 2 L into the lungs continuous.     pravastatin 80 MG tablet  Commonly known as:  PRAVACHOL  Take 1 tablet (80 mg total) by mouth every evening.     predniSONE 10 MG tablet  Commonly known as:  DELTASONE  Take  2 each am until better then 1 each am     Tiotropium Bromide Monohydrate 2.5 MCG/ACT Aers  Commonly known as:  SPIRIVA RESPIMAT  2 puffs each am     valsartan 80 MG tablet  Commonly known as:  DIOVAN  Take 1 tablet (80 mg total) by mouth daily.        Relevant Imaging Results:  Relevant Lab Results:  Recent Labs    Additional Information SS# [id number]   Percival Intern, [id number]

## 2014-12-28 NOTE — Clinical Social Work Note (Cosign Needed)
Clinical Social Work Assessment  Patient Details  Name: Judith Boyer MRN: [id number] Date of Birth: 01/02/1946  Date of referral:  12/28/14               Reason for consult:  Facility Placement                Permission sought to share information with:  Facility Contact Representative Permission granted to share information::  Yes, Verbal Permission Granted  Name::     Judith Boyer  Agency::  SNF  Relationship::  Son  Contact Information:  [phone]  Housing/Transportation Living arrangements for the past 2 months:  Single Family Home Source of Information:  Patient Patient Interpreter Needed:  None Criminal Activity/Legal Involvement Pertinent to Current Situation/Hospitalization:  No - Comment as needed Significant Relationships:  Adult Children Lives with:  Self, Adult Children Do you feel safe going back to the place where you live?  Yes Need for family participation in patient care:  Yes (Comment)  Care giving concerns:  Patient lives at home with patient son, but patient may need a higher lever of care than what she would be receiving at home. Physical therapy is recommending a skilled nursing facility.   Social Worker assessment / plan:  BSW intern met with patient at bedside to complete assessment. Patient stated that she lived at home alone with her son. BSW intern explained the SNF process with the patient and asked the patient if that was something the patient would be interested in. At first the patient was reluctant to the idea, but after further explanation the patient understood what services a SNF could provide. The patient was agreeable to BSW intern sending out patient information to facilities. BSW intern gave the patient a list of SNFs and explained the process of choosing a facility once facilities had made an offer. Patient requested that patient information not be sent to [facility] or [facility] because she did not want to go there. The patient has  preference to [facility]. During social work assessment, Palliative Care came into the room to discuss a meeting time for the family. Palliative had met with the patient a year ago and was planning to meet again tomorrow at 3:15 with the family. Social worker will continue to follow and assist as needed.  Employment status:  Retired Insurance information:  Managed Medicare PT Recommendations:  Skilled Nursing Facility Information / Referral to community resources:  Skilled Nursing Facility  Patient/Family's Response to care:  Patient seemed content with the care she was receiving at the hospital.  Patient/Family's Understanding of and Emotional Response to Diagnosis, Current Treatment, and Prognosis:  Patient seemed to be overwhelmed and nervous about the situation. Patient wishes to go home with home health and home health physical therapy, but patient understands that her condition and medical needs may require her to go to a skilled nursing facility.   Emotional Assessment Appearance:  Appears stated age Attitude/Demeanor/Rapport:   (Appropriate) Affect (typically observed):  Overwhelmed, Anxious, Pleasant, Appropriate Orientation:  Oriented to Self, Oriented to Place, Oriented to  Time, Oriented to Situation Alcohol / Substance use:  Not Applicable Psych involvement (Current and /or in the community):  No (Comment)  Discharge Needs  Concerns to be addressed:    Readmission within the last 30 days:  No Current discharge risk:  None Barriers to Discharge:  Continued Medical Work up    Maggie Cox BSW Intern, [phone]  

## 2014-12-28 NOTE — Consult Note (Signed)
   [facility] CM Inpatient Consult   12/28/2014  BLIMY TUDOR 1945-11-18 [id number] Came by to see the patient but she and a visitor were in a deep discussion.  This Probation officer will follow up.  Spoke with inpatient RNCM regarding patient with history with [facility].  Currently, the discharge plan is for a skilled nursing facility - short term.  Will follow up.  For questions, please contact: Natividad Brood, RN BSN [facility] Liaison  [phone] business mobile phone

## 2014-12-29 DIAGNOSIS — E875 Hyperkalemia: Secondary | ICD-10-CM

## 2014-12-29 DIAGNOSIS — E43 Unspecified severe protein-calorie malnutrition: Secondary | ICD-10-CM

## 2014-12-29 DIAGNOSIS — R0600 Dyspnea, unspecified: Secondary | ICD-10-CM

## 2014-12-29 DIAGNOSIS — Z7189 Other specified counseling: Secondary | ICD-10-CM

## 2014-12-29 DIAGNOSIS — J9621 Acute and chronic respiratory failure with hypoxia: Secondary | ICD-10-CM | POA: Diagnosis present

## 2014-12-29 DIAGNOSIS — Z515 Encounter for palliative care: Secondary | ICD-10-CM

## 2014-12-29 DIAGNOSIS — J9622 Acute and chronic respiratory failure with hypercapnia: Secondary | ICD-10-CM

## 2014-12-29 LAB — GLUCOSE, CAPILLARY
GLUCOSE-CAPILLARY: 169 mg/dL — AB (ref 65–99)
GLUCOSE-CAPILLARY: 137 mg/dL — AB (ref 65–99)
Glucose-Capillary: 132 mg/dL — ABNORMAL HIGH (ref 65–99)
Glucose-Capillary: 220 mg/dL — ABNORMAL HIGH (ref 65–99)
Glucose-Capillary: 75 mg/dL (ref 65–99)

## 2014-12-29 MED ORDER — DILTIAZEM HCL 60 MG PO TABS
60.0000 mg | ORAL_TABLET | Freq: Three times a day (TID) | ORAL | Status: DC
  Administered 2014-12-29 – 2014-12-30 (×3): 60 mg via ORAL
  Filled 2014-12-29 (×3): qty 1

## 2014-12-29 MED ORDER — INSULIN ASPART 100 UNIT/ML ~~LOC~~ SOLN
12.0000 [IU] | Freq: Three times a day (TID) | SUBCUTANEOUS | Status: DC
  Administered 2014-12-29 – 2014-12-30 (×4): 12 [IU] via SUBCUTANEOUS

## 2014-12-29 NOTE — Clinical Social Work Note (Signed)
[facility] prepared to admit patient on Friday. Report left for covering CSW.   Liz Beach MSW, [place], [place], [id number]

## 2014-12-29 NOTE — Consult Note (Signed)
Consultation Note Date: 12/29/2014   Patient Name: Judith Boyer  DOB: 27-Sep-1945  MRN: [id number]  Age / Sex: 69 y.o., female  PCP: [name], MD Referring Physician: Allie Bossier, MD  Reason for Consultation: Establishing goals of care    Clinical Assessment/Narrative:   This NP Wadie Lessen reviewed medical records, received report from team, assessed the patient and then meet at the patient's bedside along with her two sons and a neice  to discuss diagnosis, prognosis, GOC, disposition and options.  Son Merrilee Seashore left room  shortly after meeting began upset and not wanting to" talk about this", "the doctors told us she will be bteter in 6-8 weeks".  Son [name] requested to continue on with disussion  A discussion was had today regarding advanced directives.  Concepts specific to code status, artifical feeding and hydration, continued IV antibiotics and rehospitalization was had.  The difference between a aggressive medical intervention path  and a palliative comfort care path for this patient at this time was had.  Values and goals of care important to patient and family were attempted to be elicited.  Concept of Hospice and Palliative Care were discussed  Questions and concerns addressed.  Family encouraged to call with questions or concerns.  PMT will continue to support holistically.  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 Multiple re hospitalization with acute on chronic respiratory failure.  Continued physical and functional decline over the past several years.  Has been resistant to discuss advanced directives and anticipatory care needs.   Patient and her family verbalize a different/more optimistic understanding of her medical diagnosis and prognosis than  documented by her health providers and EMR documentation  HCPOA: none documented   SUMMARY OF RECOMMENDATIONS  -open to all offered and available  medical interventions to prolong life -SNF for rehabilitation with use of BiPap as indicated -ultimately hope is to return home    Code Status/Advance Care Planning:  Partial      Code Status Orders        Start     Ordered   12/22/14 1749  Limited resuscitation (code)   Continuous    Question Answer Comment  In the event of cardiac or respiratory ARREST: Initiate Code Blue, Call Rapid Response Yes   In the event of cardiac or respiratory ARREST: Perform CPR Yes   In the event of cardiac or respiratory ARREST: Perform Intubation/Mechanical Ventilation No   In the event of cardiac or respiratory ARREST: Use NIPPV/BiPAp only if indicated Yes   In the event of cardiac or respiratory ARREST: Administer ACLS medications if indicated Yes   In the event of cardiac or respiratory ARREST: Perform Defibrillation or Cardioversion if indicated Yes      12/22/14 1748        Symptom Management:   Dyspnea: utilization of Roxanol 5 mg po/sl every 3 hrs prn   Palliative Prophylaxis: bowel regime    Psycho-social/Spiritual:   Support System: fair Desire for further Chaplaincy support:no    Prognosis: hospice eligible  Discharge Planning: SNF for rehabilitation  Chief Complaint/ Primary Diagnoses: Present on Admission:  . COPD exacerbation ([place]) . (Resolved) Uncontrolled diabetes mellitus type 2 without complications ([place]) . Acute on chronic respiratory failure with hypoxia ([place]) . Tobacco abuse . Noncompliance . Hyperkalemia . Acute exacerbation of chronic obstructive pulmonary disease (COPD) ([place]) . Anxiety . COPD GOLD III . Diabetes mellitus with complication ([place]) . Dyspnea . Hypertension . Protein-calorie malnutrition, severe ([place])  I have reviewed the  medical record, interviewed the patient and family, and examined the patient. The following aspects are pertinent.  Past Medical History  Diagnosis Date  . COPD (chronic obstructive pulmonary disease) ([place])     . Hypertension   . Coronary artery disease   . Shortness of breath   . High cholesterol   . Myocardial infarction ([place]) 02/2011  . On home oxygen therapy     "2L; 24h/day" (12/20/2014)  . Pneumonia     "couple times" (02/08/2014)  . Type II diabetes mellitus ([place])     type 2  . Anemia   . History of blood transfusion 2014    "extremely anemic"   Social History   Social History  . Marital Status: Widowed    Spouse Name: N/A  . Number of Children: N/A  . Years of Education: N/A   Social History Main Topics  . Smoking status: Former Smoker -- 1.00 packs/day for 50 years    Types: Cigarettes    Quit date: 01/10/2013  . Smokeless tobacco: Never Used  . Alcohol Use: No  . Drug Use: No  . Sexual Activity: No   Other Topics Concern  . None   Social History Narrative   Family History  Problem Relation Age of Onset  . Heart disease Maternal Grandmother   . Brain cancer Brother   . Lung cancer Mother     smoked   Scheduled Meds: . antiseptic oral rinse  7 mL Mouth Rinse BID  . arformoterol  15 mcg Nebulization BID  . budesonide (PULMICORT) nebulizer solution  0.5 mg Nebulization BID  . clopidogrel  75 mg Oral q morning - 10a  . diltiazem  45 mg Oral 3 times per day  . enoxaparin (LOVENOX) injection  30 mg Subcutaneous Q24H  . feeding supplement (GLUCERNA SHAKE)  237 mL Oral TID BM  . guaiFENesin  1,200 mg Oral Daily  . insulin aspart  0-20 Units Subcutaneous TID WC  . insulin aspart  0-5 Units Subcutaneous QHS  . insulin aspart  12 Units Subcutaneous TID WC  . insulin NPH Human  15 Units Subcutaneous BID AC & HS  . methylPREDNISolone (SOLU-MEDROL) injection  40 mg Intravenous Q12H  . pravastatin  80 mg Oral QPM  . roflumilast  500 mcg Oral Daily  . senna-docusate  2 tablet Oral QHS   Continuous Infusions:  PRN Meds:.acetaminophen **OR** acetaminophen, ALPRAZolam, benzonatate, bisacodyl, levalbuterol, LORazepam, morphine injection, nitroGLYCERIN, ondansetron **OR**  ondansetron (ZOFRAN) IV, sodium phosphate, traZODone Medications Prior to Admission:  Prior to Admission medications   Medication Sig Start Date End Date Taking? Authorizing Provider  albuterol (PROAIR HFA) 108 (90 BASE) MCG/ACT inhaler Inhale 2 puffs into the lungs every 4 (four) hours as needed for wheezing or shortness of breath. 08/17/14  Yes [name], MD  albuterol (PROVENTIL) (2.5 MG/3ML) 0.083% nebulizer solution Take 3 mLs (2.5 mg total) by nebulization every 4 (four) hours as needed for wheezing or shortness of breath. 12/17/14  Yes Tanda Rockers, MD  ALPRAZolam Duanne Moron) 0.25 MG tablet Take 1 tablet (0.25 mg total) by mouth 2 (two) times daily. 09/21/14  Yes [name], MD  budesonide-formoterol (SYMBICORT) 160-4.5 MCG/ACT inhaler Take 2 puffs first thing in am and then another 2 puffs about 12 hours later. 10/28/14  Yes Tanda Rockers, MD  clopidogrel (PLAVIX) 75 MG tablet Take 1 tablet (75 mg total) by mouth every morning. 06/28/13  Yes [name], MD  Guaifenesin [facility] MAXIMUM STRENGTH) 1200 MG  TB12 Take 1,200 mg by mouth 2 (two) times daily.   Yes Historical Provider, MD  insulin NPH Human (NOVOLIN N) 100 UNIT/ML injection Inject 14 Units into the skin daily.    Yes Historical Provider, MD  Iron TABS Take 1 tablet by mouth daily.   Yes Historical Provider, MD  metFORMIN (GLUCOPHAGE) 500 MG tablet Take 2 tablets (1,000 mg total) by mouth 2 (two) times daily with a meal. 09/14/14  Yes Verner Mould, MD  montelukast (SINGULAIR) 10 MG tablet take 1 tablet by mouth once daily Patient taking differently: take 10 mg by mouth once every evening 07/09/14  Yes [name], MD  nitroGLYCERIN (NITROSTAT) 0.4 MG SL tablet Place 1 tablet (0.4 mg total) under the tongue every 5 (five) minutes x 3 doses as needed for chest pain. 02/07/12  Yes Dixie Dials, MD  OXYGEN Inhale 2 L into the lungs continuous.   Yes Historical Provider, MD  pravastatin (PRAVACHOL) 80 MG tablet Take 1 tablet  (80 mg total) by mouth every evening. 06/28/13  Yes [name], MD  predniSONE (DELTASONE) 10 MG tablet Take  2 each am until better then 1 each am Patient taking differently: Take 20 mg by mouth daily with breakfast.  12/17/14  Yes Tanda Rockers, MD  Tiotropium Bromide Monohydrate (SPIRIVA RESPIMAT) 2.5 MCG/ACT AERS 2 puffs each am Patient taking differently: Inhale 2 puffs into the lungs every morning. 2 puffs each am 12/03/14  Yes Tanda Rockers, MD  valsartan (DIOVAN) 80 MG tablet Take 1 tablet (80 mg total) by mouth daily. Patient taking differently: Take 80 mg by mouth every evening.  02/22/14  Yes Dorian Heckle English, PA   Allergies  Allergen Reactions  . Doxycycline Anaphylaxis  . Codeine Other (See Comments)    hyperactivity  . Dulera [Mometasone Furo-Formoterol Fum] Hives and Rash  . Adhesive [Tape] Rash  . Latex Other (See Comments)    unknown  . Lisinopril Cough    Review of Systems  Constitutional: Positive for activity change and fatigue.  Respiratory: Positive for cough.     Physical Exam  Constitutional:  frail  HENT:  Head: Normocephalic and atraumatic.  Respiratory: She is in respiratory distress. She has decreased breath sounds.  Skin: Skin is warm and dry. Ecchymosis noted.    Vital Signs: BP 152/49 mmHg  Pulse 120  Temp(Src) 97.3 F (36.3 C) (Oral)  Resp 24  Ht 5\' 1"  (1.549 m)  Wt 44.7 kg (98 lb 8.7 oz)  BMI 18.63 kg/m2  SpO2 96%  SpO2: SpO2: 96 % O2 Device:SpO2: 96 % O2 Flow Rate: .O2 Flow Rate (L/min): 3 L/min  IO: Intake/output summary:  Intake/Output Summary (Last 24 hours) at 12/29/14 1456 Last data filed at 12/29/14 1400  Gross per 24 hour  Intake    580 ml  Output   1030 ml  Net   -450 ml    LBM: Last BM Date: 12/29/14 Baseline Weight: Weight: 43.999 kg (97 lb) Most recent weight: Weight: 44.7 kg (98 lb 8.7 oz)      Palliative Assessment/Data:  Flowsheet Rows        Most Recent Value   Intake Tab    Referral Department   Critical care   Unit at Time of Referral  ICU   Palliative Care Primary Diagnosis  Pulmonary   Date Notified  12/27/14   Palliative Care Type  Return patient Palliative Care   Reason for referral  Non-pain Symptom   Date  of Admission  12/20/14   # of days IP prior to Palliative referral  7   Clinical Assessment    Psychosocial & Spiritual Assessment    Palliative Care Outcomes       Additional Data Reviewed:  CBC:    Component Value Date/Time   WBC 10.6* 12/25/2014 0254   WBC 10.4* 05/01/2014 1253   HGB 11.2* 12/25/2014 0254   HGB 12.7 05/01/2014 1253   HCT 36.9 12/25/2014 0254   HCT 41.6 05/01/2014 1253   PLT 358 12/25/2014 0254   MCV 93.9 12/25/2014 0254   MCV 93.4 05/01/2014 1253   NEUTROABS 14.2* 12/24/2014 0410   LYMPHSABS 0.6* 12/24/2014 0410   MONOABS 0.7 12/24/2014 0410   EOSABS 0.0 12/24/2014 0410   BASOSABS 0.0 12/24/2014 0410   Comprehensive Metabolic Panel:    Component Value Date/Time   NA 140 12/26/2014 0335   K 4.7 12/26/2014 0335   CL 100* 12/26/2014 0335   CO2 36* 12/26/2014 0335   BUN 28* 12/26/2014 0335   CREATININE 0.51 12/26/2014 0335   CREATININE 0.46* 11/18/2014 1153   GLUCOSE 236* 12/26/2014 0335   CALCIUM 8.9 12/26/2014 0335   AST 20 12/24/2014 0410   ALT 23 12/24/2014 0410   ALKPHOS 49 12/24/2014 0410   BILITOT 0.1* 12/24/2014 0410   PROT 5.7* 12/24/2014 0410   ALBUMIN 3.0* 12/24/2014 0410     Time In: 1500 Time Out: 1630 Time Total: 90 min Greater than 50%  of this time was spent counseling and coordinating care related to the above assessment and plan.  Signed by: Wadie Lessen, NP  Knox Royalty, NP  12/29/2014, 2:56 PM  Please contact Palliative Medicine Team phone at [phone] for questions and concerns.

## 2014-12-29 NOTE — Progress Notes (Signed)
Physical Therapy Treatment Patient Details Name: Judith Boyer MRN: [id number] DOB: 25-Sep-1945 Today's Date: 12/29/2014    History of Present Illness [name] is a 69 y.o. female  with COPD, DM2, HTN, CAD, MI 2013. Patient admitted with acute on chronic respiratory failure    PT Comments    Pt with improved ambulation tolerance this date on 4LO2 via [place] however remains to have SOB and requires freq standing rest breaks. Pt to con't to benefit from acute PT to assist in improving strength and endurance.  Follow Up Recommendations  SNF;Supervision/Assistance - 24 hour     Equipment Recommendations  Wheelchair (measurements PT)    Recommendations for Other Services       Precautions / Restrictions Precautions Precautions: Fall Precaution Comments: O2 dependent Restrictions Weight Bearing Restrictions: No    Mobility  Bed Mobility               General bed mobility comments: pt received sitting EOB  Transfers Overall transfer level: Needs assistance Equipment used: Rolling walker (2 wheeled) Transfers: Sit to/from Stand Sit to Stand: Min assist         General transfer comment: v/c's to push up from bed, increased time. minA to steady during transition of hands from bed to walker, assist for power up  Ambulation/Gait Ambulation/Gait assistance: Min guard Ambulation Distance (Feet): 100 Feet Assistive device: Rolling walker (2 wheeled) Gait Pattern/deviations: Step-through pattern;Decreased stride length Gait velocity: decreased   General Gait Details: freq standing rest breaks, great utilization of pursed lipped breathing   Stairs            Wheelchair Mobility    Modified Rankin (Stroke Patients Only)       Balance           Standing balance support: Bilateral upper extremity supported Standing balance-Leahy Scale: Poor Standing balance comment: requires UE assist for safe standing                    Cognition  Arousal/Alertness: Awake/alert Behavior During Therapy: WFL for tasks assessed/performed Overall Cognitive Status: Within Functional Limits for tasks assessed                      Exercises      General Comments        Pertinent Vitals/Pain Pain Assessment: No/denies pain    Home Living                      Prior Function            PT Goals (current goals can now be found in the care plan section) Progress towards PT goals: Progressing toward goals    Frequency  Min 3X/week    PT Plan Current plan remains appropriate    Co-evaluation             End of Session Equipment Utilized During Treatment: Gait belt;Oxygen Activity Tolerance: Patient limited by fatigue Patient left: in chair;with call bell/phone within reach     Time: 1017-1047 PT Time Calculation (min) (ACUTE ONLY): 30 min  Charges:  $Gait Training: 23-37 mins                    G Codes:      Kingsley Callander 12/29/2014, 12:20 PM   Kittie Plater, PT, DPT Pager #: [phone] Office #: [phone]

## 2014-12-29 NOTE — Progress Notes (Signed)
OT Cancellation Note  Patient Details Name: Judith Boyer MRN: [id number] DOB: Oct 13, 1945   Cancelled Treatment:    Reason Eval/Treat Not Completed: Other (comment). Attempted to see pt earlier this AM and she was eating breakfast. Returned now and HR up again as it was yesterday when I attempted to see her (123-128) and pt stating "I'm just not good right now". Spoke with nursing and she reports it is about time for pt's heart medications. Pt not seen today with 2 attempts.  Almon Register [id number] 12/29/2014, 1:08 PM

## 2014-12-29 NOTE — Progress Notes (Signed)
PULMONARY / CRITICAL CARE MEDICINE   Name: Judith Boyer MRN: [id number] DOB: 27-Feb-1945    ADMISSION DATE:  12/20/2014 CONSULTATION DATE:  12/25/14  REFERRING MD :  [facility]/Rizwan   CHIEF COMPLAINT:  COPD exacerbation    69 yo former smoker with severe COPD (FEV1 33%) presented with productive cough and dyspnea. She was started on tx for AECOPD, but was slow to improve. MW pt  SUBJECTIVE:  Still very short of breath, ambulating few steps with PT Dis not use CPAP overnight due to coughing   VITAL SIGNS: BP 129/47 mmHg  Pulse 98  Temp(Src) 98.1 F (36.7 C) (Oral)  Resp 21  Ht 5\' 1"  (1.549 m)  Wt 98 lb 8.7 oz (44.7 kg)  BMI 18.63 kg/m2  SpO2 99%  INTAKE / OUTPUT: I/O last 3 completed shifts: In: [id number] [P.O.:1347] Out: 725 [Urine:725]  PHYSICAL EXAMINATION: General: sitting in chair, chr ill appearing HEENT: purse lip breathing Cardiac: regular, tachycardic Chest: poor air movement, faint wheeze Abd: soft, non tender Ext: 1+ edema Neuro: normal strength   LABS:  CBC  Recent Labs Lab 12/23/14 0310 12/24/14 0410 12/25/14 0254  WBC 11.7* 15.5* 10.6*  HGB 11.5* 11.0* 11.2*  HCT 37.0 36.5 36.9  PLT 330 383 358   Coag's No results for input(s): APTT, INR in the last 168 hours. BMET  Recent Labs Lab 12/24/14 0410 12/25/14 0254 12/26/14 0335  NA 141 137 140  K 4.7 5.2* 4.7  CL 99* 96* 100*  CO2 38* 32 36*  BUN 22* 22* 28*  CREATININE 0.55 0.53 0.51  GLUCOSE 200* 261* 236*   Electrolytes  Recent Labs Lab 12/23/14 0310 12/24/14 0410 12/25/14 0254 12/26/14 0335  CALCIUM 8.7* 8.8* 9.0 8.9  MG 2.2 2.2  --   --    Sepsis Markers No results for input(s): LATICACIDVEN, PROCALCITON, O2SATVEN in the last 168 hours. ABG  Recent Labs Lab 12/25/14 1227  PHART 7.391  PCO2ART 59.7*  PO2ART 74.0*   Liver Enzymes  Recent Labs Lab 12/23/14 0310 12/24/14 0410  AST 18 20  ALT 20 23  ALKPHOS 49 49  BILITOT 0.2* 0.1*  ALBUMIN 3.0* 3.0*    Cardiac Enzymes No results for input(s): TROPONINI, PROBNP in the last 168 hours. Glucose  Recent Labs Lab 12/27/14 1308 12/27/14 1639 12/28/14 0803 12/28/14 1141 12/29/14 0421 12/29/14 0801  GLUCAP 164* 137* 175* 269* 132* 169*    Imaging No results found.  STUDIES:   CULTURES:  ANTIBIOTICS: Levaquin 11/14 >> 11/19   SIGNIFICANT EVENTS: 11/14 Admit  LINES/TUBES:  DISCUSSION: 69 yo female with severe COPD admitted with exacerbation, and slow to improve.  ASSESSMENT / PLAN:  Acute on chronic hypoxic/hypercapnic respiratory failure 2nd to AECOPD. 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 Plan: - oxygen to keep SpO2 88 to 95% - BiPAP qhs and prn during the day - continue pulmicort, brovana, spiriva - can change to prednisone 40 mg  - add daliresp 11/20 -dc spiriva, start duonebs 4/d - prn morphine for dyspnea - bronchial hygiene   Anxiety, insomnia. Plan: - prn xanax, ativan - ct  trazodone qhs prn  Goals of Care. Plan: - DNI - Initiate palliative  assessment -SNF recommended  PCCM will resume FU on Friday, she is fixated on needing Bipap since this helped her during this admit. Will await palliative discussion , can discharge on bipap 10/5  For chronic resp failure  - will need to stay in hospital 1 more night & use bipap if  so  Rigoberto Noel. MD [id number]  12/29/2014, 10:52 AM

## 2014-12-29 NOTE — Progress Notes (Signed)
Judith Boyer - Stepdown/ICU TEAM Progress Note  Judith Boyer [id number] DOB: 26-Jan-1946 DOA: 12/20/2014 PCP: Jenny Reichmann, MD  Admit HPI / Brief Narrative: 69 y.o. WF PMHx COPD on 2 L O2 at home, HTN, CAD native artery, HLD, DM Type 2.  NOTE; patient last seen by Dr.Michael B Wert [facility] M) 10/28 for same symptoms. Patient was treated at that time with antibiotics, Judith Boyer, DuoNeb, and Judith Boyer.   Patient was recently hospitalized with acute on chronic respiratory failure. Her breathing was slow to improve and given advanced disease Palliative Medicine was consulted. Palliative care team attempted to discuss CODE STATUS with the patient on multiple occasions but she apparently refused. Patient has been on a tapering dose of steroids since discharge in August, currently taking 20mg  of prednisone daily. She is worried about blood sugars being high.   Patient has been becoming increasingly short of breath at home over the last week. Pulmonary gave her an office appointment for Thursday but patient felt she could not wait. Patient has been coughing up excessive amount of clear mucus. She feels her chest is very congested despite twice-daily Mucinex. Hears herself wheezing. No fevers. She was unable to use inhaler at home today due to significant shortness of breath. No complaints of peripheral edema.    HPI/Subjective: 11/23 A/O x4. States ambulated around ward slowly while on O2 today. States had to stop after becoming SOB/tachycardic.       Assessment/Plan:  COPD exacerbation ([place])./Acute on chronic respiratory failure with hypoxia/hypercapnia  -Continue nebulizers -IV Solu-Medrol 40 mg BID -Continue Mucinex twice daily -Brovana BID -Pulmicort BID -Xopenex  PRN -Start Daliresp 500 g daily -Physiotherapy Vest BID -Continue Oxygen therapy.at 3L; titrate to maintain SPO2 89-93%  -Continue Xanax TID daily -helps breathing -Morphine Boyer mg PRN feeling SOB -PT/OT; recommends  SNF NOTE; patient meets criteria for chronic O2/BiPAP. On room air pH= 7.38, PCO2= 61, PO2= 85  Sinus Tachycardia/HTN -Increase Cardizam 60  mg TID  Noncompliance medication -See history of present illness  Uncontrolled diabetes mellitus type 2 uncontrolled -10/13 hemoglobin A1c= 8.3 -Monitor CBGs closely on Solu-Medrol -Increase NPH to 15 units BID -Increase NovoLog 12 units QAC -Resistant SSI  CAD, s/p stenting 2009.  -On chronic Plavix. Echocardiogram January 2015. LDL ejection fraction 55-60%  Tobacco abuse -Spoke at length to patient detailing absolute need to discontinue tobacco, as the result would be death   Goals of care -Had a long meeting with family members (son/wife, husband) and explained the poor prognosis of patient given her continued increasing SOB although on steroids, and patient's unwillingness to use medication prescribed by pulmonologist(Tudorza).  -Per son when initially usingTudorza breathing improved however patient did not like the feeling she had in her chest post use. Per Dr. Melvyn Novas note she stated couldn't afford it. Patient also stopped Spiriva; could not urinate Unwillingness to stop smoking. -Counseled family that patient had poor prognosis of leaving hospital. -Counseled family that if patient was intubated probably would not be able to be extubated. Family to discuss change of CODE STATUS with patient and inform staff.    Code Status: DO NOT INTUBATE Family Communication: family present at time of exam Disposition Plan: Discharge to SNF on 11/25    Consultants: Dr.Rakesh Hoyle [facility] M  Procedure/Significant Events: NA   Culture 11/15 MRSA by PCR negative 11/15 strep pneumo urine antigen pending 11/15 Legionella urine antigen pending 11/15 sputum pending   Antibiotics: Levofloxacin 11/14>> 11/18   DVT prophylaxis: Lovenox  Devices    LINES / TUBES:      Continuous Infusions:   Objective: VITAL SIGNS: Temp: 98.8 F  (37.Boyer C) (11/23 1639) Temp Source: Oral (11/23 1639) BP: 134/62 mmHg (11/23 1639) Pulse Rate: 96 (11/23 1639) SPO2; FIO2:   Intake/Output Summary (Last 24 hours) at 12/29/14 1833 Last data filed at 12/29/14 1745  Gross per 24 hour  Intake    340 ml  Output   1230 ml  Net   -890 ml     Exam: General: Now on BiPAP able to speak in short full sentences, Acute on Chronic respiratory distress (new baseline?) Eyes: Negative headache, ,negative scleral hemorrhage ENT: Negative Runny nose, negative gingival bleeding, Neck:  Negative scars, masses, torticollis, lymphadenopathy, JVD Lungs: Poor air movement all fields, negative wheezes, negative crackles Cardiovascular: Tachycardic, Regular rhythm without murmur gallop or rub normal S1 and S2 Abdomen:negative abdominal pain, nondistended, positive soft, bowel sounds, no rebound, no ascites, no appreciable mass Extremities: No significant cyanosis, clubbing, or edema bilateral lower extremities Psychiatric:  Positive Anxiety,  Neurologic:  Cranial nerves II through XII intact, tongue/uvula midline, all extremities muscle strength 5/5, sensation intact throughout, negative dysarthria, negative expressive aphasia, negative receptive aphasia.   Data Reviewed: Basic Metabolic Panel:  Recent Labs Lab 12/23/14 0310 12/24/14 0410 12/25/14 0254 12/26/14 0335  NA 140 141 137 140  K 4.4 4.7 5.2* 4.7  CL 98* 99* 96* 100*  CO2 35* 38* 32 36*  GLUCOSE 139* 200* 261* 236*  BUN 20 22* 22* 28*  CREATININE 0.50 0.55 0.53 0.51  CALCIUM 8.7* 8.8* 9.0 8.9  MG 2.2 2.2  --   --    Liver Function Tests:  Recent Labs Lab 12/23/14 0310 12/24/14 0410  AST 18 20  ALT 20 23  ALKPHOS 49 49  BILITOT 0.2* 0.Boyer*  PROT 5.8* 5.7*  ALBUMIN 3.0* 3.0*   No results for input(s): LIPASE, AMYLASE in the last 168 hours. No results for input(s): AMMONIA in the last 168 hours. CBC:  Recent Labs Lab 12/23/14 0310 12/24/14 0410 12/25/14 0254  WBC  11.7* 15.5* 10.6*  NEUTROABS 10.9* 14.2*  --   HGB 11.5* 11.0* 11.2*  HCT 37.0 36.5 36.9  MCV 93.4 94.Boyer 93.9  PLT 330 383 358   Cardiac Enzymes: No results for input(s): CKTOTAL, CKMB, CKMBINDEX, TROPONINI in the last 168 hours. BNP (last 3 results)  Recent Labs  06/18/14 1740 08/13/14 1320 12/25/14 1210  BNP 12.2 12.0 45.6    ProBNP (last 3 results) No results for input(s): PROBNP in the last 8760 hours.  CBG:  Recent Labs Lab 12/28/14 1141 12/29/14 0421 12/29/14 0801 12/29/14 1145 12/29/14 1641  GLUCAP 269* 132* 169* 220* 137*    Recent Results (from the past 240 hour(s))  MRSA PCR Screening     Status: None   Collection Time: 12/21/14  Boyer:34 AM  Result Value Ref Range Status   MRSA by PCR NEGATIVE NEGATIVE Final    Comment:        The GeneXpert MRSA Assay (FDA approved for NASAL specimens only), is one component of a comprehensive MRSA colonization surveillance program. It is not intended to diagnose MRSA infection nor to guide or monitor treatment for MRSA infections.      Studies:  Recent x-ray studies have been reviewed in detail by the Attending Physician  Scheduled Meds:  Scheduled Meds: . antiseptic oral rinse  7 mL Mouth Rinse BID  . arformoterol  15 mcg Nebulization BID  . budesonide (  PULMICORT) nebulizer solution  0.5 mg Nebulization BID  . clopidogrel  75 mg Oral q morning - 10a  . diltiazem  60 mg Oral 3 times per day  . enoxaparin (LOVENOX) injection  30 mg Subcutaneous Q24H  . feeding supplement (GLUCERNA SHAKE)  237 mL Oral TID BM  . guaiFENesin  Boyer,200 mg Oral Daily  . insulin aspart  0-20 Units Subcutaneous TID WC  . insulin aspart  0-5 Units Subcutaneous QHS  . insulin aspart  12 Units Subcutaneous TID WC  . insulin NPH Human  15 Units Subcutaneous BID AC & HS  . methylPREDNISolone (SOLU-MEDROL) injection  40 mg Intravenous Q12H  . pravastatin  80 mg Oral QPM  . roflumilast  500 mcg Oral Daily  . senna-docusate  2 tablet Oral  QHS    Time spent on care of this patient: 40 mins   WOODS, Geraldo Docker , MD  [facility] Office  [phone] Pager - [phone]  On-Call/Text Page:      [email or web address]      password [email or web address]  If 7PM-7AM, please contact night-coverage [email or web address] Password [facility] 12/29/2014, 6:33 PM   LOS: 8 days   Care during the described time interval was provided by me .  I have reviewed this patient's available data, including medical history, events of note, physical examination, and all test results as part of my evaluation. I have personally reviewed and interpreted all radiology studies.   Dia Crawford, MD [phone] Pager

## 2014-12-30 LAB — GLUCOSE, CAPILLARY
GLUCOSE-CAPILLARY: 280 mg/dL — AB (ref 65–99)
Glucose-Capillary: 109 mg/dL — ABNORMAL HIGH (ref 65–99)
Glucose-Capillary: 174 mg/dL — ABNORMAL HIGH (ref 65–99)
Glucose-Capillary: 267 mg/dL — ABNORMAL HIGH (ref 65–99)

## 2014-12-30 MED ORDER — INSULIN ASPART 100 UNIT/ML ~~LOC~~ SOLN
16.0000 [IU] | Freq: Three times a day (TID) | SUBCUTANEOUS | Status: DC
  Administered 2014-12-31 (×2): 16 [IU] via SUBCUTANEOUS

## 2014-12-30 MED ORDER — DILTIAZEM HCL 60 MG PO TABS
90.0000 mg | ORAL_TABLET | Freq: Three times a day (TID) | ORAL | Status: DC
  Administered 2014-12-30 – 2014-12-31 (×3): 90 mg via ORAL
  Filled 2014-12-30 (×3): qty 1

## 2014-12-30 MED ORDER — ALPRAZOLAM 0.5 MG PO TABS
0.5000 mg | ORAL_TABLET | Freq: Three times a day (TID) | ORAL | Status: DC | PRN
Start: 2014-12-30 — End: 2014-12-31
  Administered 2014-12-30 – 2014-12-31 (×2): 0.5 mg via ORAL
  Filled 2014-12-30 (×2): qty 1

## 2014-12-30 NOTE — Progress Notes (Signed)
[place] TEAM 1 - Stepdown/ICU TEAM Progress Note  ALNITA SCHEURER [id number] DOB: August 05, 1945 DOA: 12/20/2014 PCP: Jenny Reichmann, MD  Admit HPI / Brief Narrative: 69 y.o. WF PMHx COPD on 2 L O2 at home, HTN, CAD native artery, HLD, DM Type 2.  NOTE; patient last seen by Dr.Michael B Wert [facility] M) 10/28 for same symptoms. Patient was treated at that time with antibiotics, [name], DuoNeb, and [name].   Patient was recently hospitalized with acute on chronic respiratory failure. Her breathing was slow to improve and given advanced disease Palliative Medicine was consulted. Palliative care team attempted to discuss CODE STATUS with the patient on multiple occasions but she apparently refused. Patient has been on a tapering dose of steroids since discharge in August, currently taking 20mg  of prednisone daily. She is worried about blood sugars being high.   Patient has been becoming increasingly short of breath at home over the last week. Pulmonary gave her an office appointment for Thursday but patient felt she could not wait. Patient has been coughing up excessive amount of clear mucus. She feels her chest is very congested despite twice-daily Mucinex. Hears herself wheezing. No fevers. She was unable to use inhaler at home today due to significant shortness of breath. No complaints of peripheral edema.    HPI/Subjective: 11/24 A/O x4. States did not ambulate today but feels significantly improved. Negative CP, negative acute SOB.      Assessment/Plan:  COPD exacerbation ([place])./Acute on chronic respiratory failure with hypoxia/hypercapnia  -Continue nebulizers -IV Solu-Medrol 40 mg BID -Continue Mucinex twice daily -Brovana BID -Pulmicort BID -Xopenex  PRN -Start Daliresp 500 g daily -Physiotherapy Vest BID -Continue Oxygen therapy.at 3L; titrate to maintain SPO2 89-93%  -Continue Xanax TID daily -helps breathing -Morphine 1 mg PRN feeling SOB -PT/OT; recommends SNF NOTE;  patient meets criteria for chronic O2/BiPAP. On room air pH= 7.38, PCO2= 61, PO2= 85  Sinus Tachycardia/HTN -Increase Cardizam 90  mg TID  Noncompliance medication -See history of present illness  Uncontrolled diabetes mellitus type 2 uncontrolled -10/13 hemoglobin A1c= 8.3 -Monitor CBGs closely on Solu-Medrol -Continue NPH to 15 units BID -Increase NovoLog 16 units QAC -Resistant SSI  CAD, s/p stenting 2009.  -On chronic Plavix. Echocardiogram January 2015. LDL ejection fraction 55-60%  Tobacco abuse -Spoke at length to patient detailing absolute need to discontinue tobacco, as the result would be death   Goals of care -Had a long meeting with family members (son/wife, husband) and explained the poor prognosis of patient given her continued increasing SOB although on steroids, and patient's unwillingness to use medication prescribed by pulmonologist(Tudorza).  -Per son when initially usingTudorza breathing improved however patient did not like the feeling she had in her chest post use. Per Dr. Melvyn Novas note she stated couldn't afford it. Patient also stopped Spiriva; could not urinate Unwillingness to stop smoking.     Code Status: DO NOT INTUBATE Family Communication: family present at time of exam Disposition Plan: Discharge to SNF on 11/25    Consultants: Dr.Rakesh Hoyle [facility] M  Procedure/Significant Events: NA   Culture 11/15 MRSA by PCR negative 11/15 strep pneumo urine antigen pending 11/15 Legionella urine antigen pending 11/15 sputum pending   Antibiotics: Levofloxacin 11/14>> 11/18   DVT prophylaxis: Lovenox   Devices    LINES / TUBES:      Continuous Infusions:   Objective: VITAL SIGNS: Temp: 98.2 F (36.8 C) (11/24 1550) Temp Source: Oral (11/24 1550) BP: 149/60 mmHg (11/24 1550) Pulse Rate:  97 (11/24 1710) SPO2; FIO2:   Intake/Output Summary (Last 24 hours) at 12/30/14 1730 Last data filed at 12/30/14 1549  Gross per 24 hour    Intake    300 ml  Output    675 ml  Net   -375 ml     Exam: General: Now on BiPAP/ able to speak in short full sentences, Acute on Chronic respiratory distress (new baseline?) Eyes: Negative headache, ,negative scleral hemorrhage ENT: Negative Runny nose, negative gingival bleeding, Neck:  Negative scars, masses, torticollis, lymphadenopathy, JVD Lungs: Poor air movement all fields, negative wheezes, negative crackles Cardiovascular: Tachycardic, Regular rhythm without murmur gallop or rub normal S1 and S2 Abdomen:negative abdominal pain, nondistended, positive soft, bowel sounds, no rebound, no ascites, no appreciable mass Extremities: No significant cyanosis, clubbing, or edema bilateral lower extremities Psychiatric:  Positive Anxiety,  Neurologic:  Cranial nerves II through XII intact, tongue/uvula midline, all extremities muscle strength 5/5, sensation intact throughout, negative dysarthria, negative expressive aphasia, negative receptive aphasia.   Data Reviewed: Basic Metabolic Panel:  Recent Labs Lab 12/24/14 0410 12/25/14 0254 12/26/14 0335  NA 141 137 140  K 4.7 5.2* 4.7  CL 99* 96* 100*  CO2 38* 32 36*  GLUCOSE 200* 261* 236*  BUN 22* 22* 28*  CREATININE 0.55 0.53 0.51  CALCIUM 8.8* 9.0 8.9  MG 2.2  --   --    Liver Function Tests:  Recent Labs Lab 12/24/14 0410  AST 20  ALT 23  ALKPHOS 49  BILITOT 0.1*  PROT 5.7*  ALBUMIN 3.0*   No results for input(s): LIPASE, AMYLASE in the last 168 hours. No results for input(s): AMMONIA in the last 168 hours. CBC:  Recent Labs Lab 12/24/14 0410 12/25/14 0254  WBC 15.5* 10.6*  NEUTROABS 14.2*  --   HGB 11.0* 11.2*  HCT 36.5 36.9  MCV 94.1 93.9  PLT 383 358   Cardiac Enzymes: No results for input(s): CKTOTAL, CKMB, CKMBINDEX, TROPONINI in the last 168 hours. BNP (last 3 results)  Recent Labs  06/18/14 1740 08/13/14 1320 12/25/14 1210  BNP 12.2 12.0 45.6    ProBNP (last 3 results) No  results for input(s): PROBNP in the last 8760 hours.  CBG:  Recent Labs Lab 12/29/14 1641 12/29/14 2142 12/30/14 0853 12/30/14 1232 12/30/14 1549  GLUCAP 137* 75 280* 109* 174*    Recent Results (from the past 240 hour(s))  MRSA PCR Screening     Status: None   Collection Time: 12/21/14  1:34 AM  Result Value Ref Range Status   MRSA by PCR NEGATIVE NEGATIVE Final    Comment:        The GeneXpert MRSA Assay (FDA approved for NASAL specimens only), is one component of a comprehensive MRSA colonization surveillance program. It is not intended to diagnose MRSA infection nor to guide or monitor treatment for MRSA infections.      Studies:  Recent x-ray studies have been reviewed in detail by the Attending Physician  Scheduled Meds:  Scheduled Meds: . antiseptic oral rinse  7 mL Mouth Rinse BID  . arformoterol  15 mcg Nebulization BID  . budesonide (PULMICORT) nebulizer solution  0.5 mg Nebulization BID  . clopidogrel  75 mg Oral q morning - 10a  . diltiazem  60 mg Oral 3 times per day  . enoxaparin (LOVENOX) injection  30 mg Subcutaneous Q24H  . feeding supplement (GLUCERNA SHAKE)  237 mL Oral TID BM  . guaiFENesin  1,200 mg Oral Daily  . insulin  aspart  0-20 Units Subcutaneous TID WC  . insulin aspart  0-5 Units Subcutaneous QHS  . insulin aspart  12 Units Subcutaneous TID WC  . insulin NPH Human  15 Units Subcutaneous BID AC & HS  . methylPREDNISolone (SOLU-MEDROL) injection  40 mg Intravenous Q12H  . pravastatin  80 mg Oral QPM  . roflumilast  500 mcg Oral Daily  . senna-docusate  2 tablet Oral QHS    Time spent on care of this patient: 40 mins   WOODS, Geraldo Docker , MD  [facility] Office  [phone] Pager - [phone]  On-Call/Text Page:      [email or web address]      password [email or web address]  If 7PM-7AM, please contact night-coverage [email or web address] Password [email or web address] 12/30/2014, 5:30 PM   LOS: 9 days   Care during the described time interval was provided by  me .  I have reviewed this patient's available data, including medical history, events of note, physical examination, and all test results as part of my evaluation. I have personally reviewed and interpreted all radiology studies.   Dia Crawford, MD [phone] Pager

## 2014-12-30 NOTE — Progress Notes (Signed)
Patient placed on CPAP and tolerating well at this time. RT will continue to monitor.

## 2014-12-30 NOTE — Progress Notes (Signed)
Patient has refused BiPAP use for the night due to increased cough. RT will continue to monitor.

## 2014-12-31 ENCOUNTER — Ambulatory Visit: Payer: Medicare Other | Admitting: Emergency Medicine

## 2014-12-31 DIAGNOSIS — D649 Anemia, unspecified: Secondary | ICD-10-CM | POA: Diagnosis not present

## 2014-12-31 DIAGNOSIS — J9611 Chronic respiratory failure with hypoxia: Secondary | ICD-10-CM | POA: Diagnosis not present

## 2014-12-31 DIAGNOSIS — I2510 Atherosclerotic heart disease of native coronary artery without angina pectoris: Secondary | ICD-10-CM | POA: Diagnosis not present

## 2014-12-31 DIAGNOSIS — I959 Hypotension, unspecified: Secondary | ICD-10-CM | POA: Diagnosis not present

## 2014-12-31 DIAGNOSIS — R635 Abnormal weight gain: Secondary | ICD-10-CM | POA: Diagnosis not present

## 2014-12-31 DIAGNOSIS — D72829 Elevated white blood cell count, unspecified: Secondary | ICD-10-CM | POA: Diagnosis not present

## 2014-12-31 DIAGNOSIS — E119 Type 2 diabetes mellitus without complications: Secondary | ICD-10-CM | POA: Diagnosis not present

## 2014-12-31 DIAGNOSIS — D464 Refractory anemia, unspecified: Secondary | ICD-10-CM | POA: Diagnosis not present

## 2014-12-31 DIAGNOSIS — R0600 Dyspnea, unspecified: Secondary | ICD-10-CM | POA: Diagnosis not present

## 2014-12-31 DIAGNOSIS — I10 Essential (primary) hypertension: Secondary | ICD-10-CM | POA: Diagnosis not present

## 2014-12-31 DIAGNOSIS — M6281 Muscle weakness (generalized): Secondary | ICD-10-CM | POA: Diagnosis not present

## 2014-12-31 DIAGNOSIS — J811 Chronic pulmonary edema: Secondary | ICD-10-CM | POA: Diagnosis not present

## 2014-12-31 DIAGNOSIS — J9612 Chronic respiratory failure with hypercapnia: Secondary | ICD-10-CM | POA: Diagnosis not present

## 2014-12-31 DIAGNOSIS — R278 Other lack of coordination: Secondary | ICD-10-CM | POA: Diagnosis not present

## 2014-12-31 DIAGNOSIS — R41841 Cognitive communication deficit: Secondary | ICD-10-CM | POA: Diagnosis not present

## 2014-12-31 DIAGNOSIS — J9621 Acute and chronic respiratory failure with hypoxia: Secondary | ICD-10-CM | POA: Diagnosis not present

## 2014-12-31 DIAGNOSIS — R0602 Shortness of breath: Secondary | ICD-10-CM | POA: Diagnosis not present

## 2014-12-31 DIAGNOSIS — J449 Chronic obstructive pulmonary disease, unspecified: Secondary | ICD-10-CM | POA: Diagnosis not present

## 2014-12-31 DIAGNOSIS — J9622 Acute and chronic respiratory failure with hypercapnia: Secondary | ICD-10-CM | POA: Diagnosis not present

## 2014-12-31 DIAGNOSIS — R5381 Other malaise: Secondary | ICD-10-CM | POA: Diagnosis not present

## 2014-12-31 DIAGNOSIS — D62 Acute posthemorrhagic anemia: Secondary | ICD-10-CM | POA: Diagnosis not present

## 2014-12-31 DIAGNOSIS — J441 Chronic obstructive pulmonary disease with (acute) exacerbation: Secondary | ICD-10-CM | POA: Diagnosis not present

## 2014-12-31 DIAGNOSIS — D508 Other iron deficiency anemias: Secondary | ICD-10-CM | POA: Diagnosis not present

## 2014-12-31 DIAGNOSIS — R2681 Unsteadiness on feet: Secondary | ICD-10-CM | POA: Diagnosis not present

## 2014-12-31 DIAGNOSIS — E876 Hypokalemia: Secondary | ICD-10-CM | POA: Diagnosis not present

## 2014-12-31 LAB — GLUCOSE, CAPILLARY
Glucose-Capillary: 162 mg/dL — ABNORMAL HIGH (ref 65–99)
Glucose-Capillary: 203 mg/dL — ABNORMAL HIGH (ref 65–99)

## 2014-12-31 MED ORDER — BUDESONIDE 0.5 MG/2ML IN SUSP: 0.5000 mg | Freq: Two times a day (BID) | RESPIRATORY_TRACT | Status: DC

## 2014-12-31 MED ORDER — GLUCERNA SHAKE PO LIQD: 237.0000 mL | Freq: Three times a day (TID) | ORAL | Status: AC

## 2014-12-31 MED ORDER — DILTIAZEM HCL 90 MG PO TABS: 90.0000 mg | ORAL_TABLET | Freq: Three times a day (TID) | ORAL | Status: DC

## 2014-12-31 MED ORDER — PREDNISONE 10 MG PO TABS: ORAL_TABLET | ORAL | Status: DC

## 2014-12-31 MED ORDER — PREDNISONE 20 MG PO TABS: 40.0000 mg | ORAL_TABLET | Freq: Every day | ORAL | Status: DC

## 2014-12-31 MED ORDER — MORPHINE SULFATE (CONCENTRATE) 10 MG/0.5ML PO SOLN: 5.0000 mg | ORAL | Status: DC | PRN

## 2014-12-31 MED ORDER — INSULIN ASPART 100 UNIT/ML ~~LOC~~ SOLN: 0.0000 [IU] | Freq: Every day | SUBCUTANEOUS | Status: DC

## 2014-12-31 MED ORDER — SENNOSIDES-DOCUSATE SODIUM 8.6-50 MG PO TABS: 2.0000 | ORAL_TABLET | Freq: Every day | ORAL | Status: AC

## 2014-12-31 MED ORDER — ACETAMINOPHEN 325 MG PO TABS: 650.0000 mg | ORAL_TABLET | Freq: Four times a day (QID) | ORAL | Status: AC | PRN

## 2014-12-31 MED ORDER — INSULIN ASPART 100 UNIT/ML ~~LOC~~ SOLN: 10.0000 [IU] | Freq: Three times a day (TID) | SUBCUTANEOUS | Status: DC

## 2014-12-31 MED ORDER — SALINE SPRAY 0.65 % NA SOLN
1.0000 | NASAL | Status: DC | PRN
  Filled 2014-12-31: qty 44

## 2014-12-31 MED ORDER — BISACODYL 10 MG RE SUPP: 10.0000 mg | Freq: Every day | RECTAL | Status: AC | PRN

## 2014-12-31 MED ORDER — INSULIN ASPART 100 UNIT/ML ~~LOC~~ SOLN: 16.0000 [IU] | Freq: Three times a day (TID) | SUBCUTANEOUS | Status: DC

## 2014-12-31 MED ORDER — IPRATROPIUM-ALBUTEROL 0.5-2.5 (3) MG/3ML IN SOLN: 3.0000 mL | Freq: Three times a day (TID) | RESPIRATORY_TRACT | Status: DC

## 2014-12-31 MED ORDER — SALINE SPRAY 0.65 % NA SOLN: 1.0000 | NASAL | Status: AC | PRN

## 2014-12-31 MED ORDER — INSULIN NPH (HUMAN) (ISOPHANE) 100 UNIT/ML ~~LOC~~ SUSP: 15.0000 [IU] | Freq: Two times a day (BID) | SUBCUTANEOUS | Status: DC

## 2014-12-31 MED ORDER — INSULIN ASPART 100 UNIT/ML ~~LOC~~ SOLN: 0.0000 [IU] | Freq: Three times a day (TID) | SUBCUTANEOUS | Status: DC

## 2014-12-31 MED ORDER — PROMETHAZINE-CODEINE 6.25-10 MG/5ML PO SYRP
5.0000 mL | ORAL_SOLUTION | Freq: Two times a day (BID) | ORAL | Status: DC
  Administered 2014-12-31: 5 mL via ORAL
  Filled 2014-12-31: qty 5

## 2014-12-31 MED ORDER — BENZONATATE 100 MG PO CAPS: 200.0000 mg | ORAL_CAPSULE | Freq: Three times a day (TID) | ORAL | Status: DC | PRN

## 2014-12-31 MED ORDER — PROMETHAZINE-CODEINE 6.25-10 MG/5ML PO SYRP: 5.0000 mL | ORAL_SOLUTION | Freq: Two times a day (BID) | ORAL | Status: AC

## 2014-12-31 MED ORDER — BENZONATATE 100 MG PO CAPS
200.0000 mg | ORAL_CAPSULE | Freq: Three times a day (TID) | ORAL | Status: DC
  Administered 2014-12-31: 200 mg via ORAL
  Filled 2014-12-31: qty 2

## 2014-12-31 MED ORDER — ARFORMOTEROL TARTRATE 15 MCG/2ML IN NEBU: 15.0000 ug | INHALATION_SOLUTION | Freq: Two times a day (BID) | RESPIRATORY_TRACT | Status: AC

## 2014-12-31 MED ORDER — ALPRAZOLAM 0.5 MG PO TABS: 0.5000 mg | ORAL_TABLET | Freq: Three times a day (TID) | ORAL | Status: DC | PRN

## 2014-12-31 NOTE — Progress Notes (Addendum)
PULMONARY / CRITICAL CARE MEDICINE   Name: ROSETTA NEVERSON MRN: [id number] DOB: 1945-11-05    ADMISSION DATE:  12/20/2014 CONSULTATION DATE:  12/25/14  REFERRING MD :  [facility]/Rizwan   CHIEF COMPLAINT:  COPD exacerbation    69 yo former smoker with severe COPD (FEV1 33%) presented with productive cough and dyspnea. She was started on tx for AECOPD, but was slow to improve. MW pt   11/23 palliative discussion  SUBJECTIVE:  Remains dyspneic,  Did not use CPAP overnight due to coughing afebrile   VITAL SIGNS: BP 107/85 mmHg  Pulse 93  Temp(Src) 98.7 F (37.1 C) (Oral)  Resp 20  Ht 5\' 1"  (1.549 m)  Wt 98 lb 8.7 oz (44.7 kg)  BMI 18.63 kg/m2  SpO2 100%  INTAKE / OUTPUT: I/O last 3 completed shifts: In: 400 [P.O.:400] Out: 1000 [Urine:1000]  PHYSICAL EXAMINATION: General: sitting in chair, chr ill appearing HEENT: purse lip breathing Cardiac: regular, tachycardic Chest: poor air movement, faint wheeze Abd: soft, non tender Ext: 1+ edema Neuro: normal strength   LABS:  CBC  Recent Labs Lab 12/25/14 0254  WBC 10.6*  HGB 11.2*  HCT 36.9  PLT 358   Coag's No results for input(s): APTT, INR in the last 168 hours. BMET  Recent Labs Lab 12/25/14 0254 12/26/14 0335  NA 137 140  K 5.2* 4.7  CL 96* 100*  CO2 32 36*  BUN 22* 28*  CREATININE 0.53 0.51  GLUCOSE 261* 236*   Electrolytes  Recent Labs Lab 12/25/14 0254 12/26/14 0335  CALCIUM 9.0 8.9   Sepsis Markers No results for input(s): LATICACIDVEN, PROCALCITON, O2SATVEN in the last 168 hours. ABG  Recent Labs Lab 12/25/14 1227  PHART 7.391  PCO2ART 59.7*  PO2ART 74.0*   Liver Enzymes No results for input(s): AST, ALT, ALKPHOS, BILITOT, ALBUMIN in the last 168 hours. Cardiac Enzymes No results for input(s): TROPONINI, PROBNP in the last 168 hours. Glucose  Recent Labs Lab 12/29/14 2142 12/30/14 0853 12/30/14 1232 12/30/14 1549 12/30/14 2229 12/31/14 0750  GLUCAP 75 280*  109* 174* 267* 162*    Imaging No results found.  STUDIES:   CULTURES:  ANTIBIOTICS: Levaquin 11/14 >> 11/19   SIGNIFICANT EVENTS: 11/14 Admit  LINES/TUBES:  DISCUSSION: 69 yo female with severe COPD admitted with exacerbation, and slow to improve.  ASSESSMENT / PLAN:  Acute on chronic hypoxic/hypercapnic respiratory failure 2nd to AECOPD. 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 Plan: - oxygen to keep SpO2 88 to 95% - continue pulmicort, brovana - can change to prednisone 40 mg  - add daliresp 11/20 -dc spiriva, start duonebs 4/d - prn morphine for dyspnea - bronchial hygiene -resume singulair on dc -codeine cough syrup trial for persistent cough - discussed side effects of codeine Continue symbicort/ spiriva when breathing better  Anxiety, insomnia. Plan: - prn xanax, ativan - ct  trazodone qhs prn  Goals of Care. Plan: - DNI -SNF recommended  she is fixated on needing Bipap since this helped her during this admit. She has been unable to use last 3 nights due to cough. Says she may have OSA  Will trial codeine syrup for cough  She can be discharged on CPAP - I emphasized need for compliance at least 4h/ night Sleep study as outpt to assess   Rigoberto Noel. MD [phone]  12/31/2014, 9:56 AM

## 2014-12-31 NOTE — Discharge Instructions (Signed)
Follow with Primary MD DAUB, STEVE A, MD in after discharge from SNF  Get CBC, CMP, 2 view Chest X ray checked  by Primary MD next visit.    Activity: As tolerated with Full fall precautions use walker/cane & assistance as needed   Disposition SNF   Diet: Heart Healthy , carbohydrate modified , with feeding assistance and aspiration precautions.  For Heart failure patients - Check your Weight same time everyday, if you gain over 2 pounds, or you develop in leg swelling, experience more shortness of breath or chest pain, call your Primary MD immediately. Follow Cardiac Low Salt Diet and 1.5 lit/day fluid restriction.   On your next visit with your primary care physician please Get Medicines reviewed and adjusted.   Please request your Prim.MD to go over all Hospital Tests and Procedure/Radiological results at the follow up, please get all Hospital records sent to your Prim MD by signing hospital release before you go home.   If you experience worsening of your admission symptoms, develop shortness of breath, life threatening emergency, suicidal or homicidal thoughts you must seek medical attention immediately by calling 911 or calling your MD immediately  if symptoms less severe.  You Must read complete instructions/literature along with all the possible adverse reactions/side effects for all the Medicines you take and that have been prescribed to you. Take any new Medicines after you have completely understood and accpet all the possible adverse reactions/side effects.   Do not drive, operating heavy machinery, perform activities at heights, swimming or participation in water activities or provide baby sitting services if your were admitted for syncope or siezures until you have seen by Primary MD or a Neurologist and advised to do so again.  Do not drive when taking Pain medications.    Do not take more than prescribed Pain, Sleep and Anxiety Medications  Special Instructions: If  you have smoked or chewed Tobacco  in the last 2 yrs please stop smoking, stop any regular Alcohol  and or any Recreational drug use.  Wear Seat belts while driving.   Please note  You were cared for by a hospitalist during your hospital stay. If you have any questions about your discharge medications or the care you received while you were in the hospital after you are discharged, you can call the unit and asked to speak with the hospitalist on call if the hospitalist that took care of you is not available. Once you are discharged, your primary care physician will handle any further medical issues. Please note that NO REFILLS for any discharge medications will be authorized once you are discharged, as it is imperative that you return to your primary care physician (or establish a relationship with a primary care physician if you do not have one) for your aftercare needs so that they can reassess your need for medications and monitor your lab values.

## 2014-12-31 NOTE — Clinical Social Work Placement (Signed)
   CLINICAL SOCIAL WORK PLACEMENT  NOTE  Date:  12/31/2014  Patient Details  Name: Judith Boyer MRN: [id number] Date of Birth: [birth year]  Clinical Social Work is seeking post-discharge placement for this patient at the [place] level of care (*CSW will initial, date and re-position this form in  chart as items are completed):  Yes   Patient/family provided with [place] Work Department's list of facilities offering this level of care within the geographic area requested by the patient (or if unable, by the patient's family).  Yes   Patient/family informed of their freedom to choose among providers that offer the needed level of care, that participate in Medicare, Medicaid or managed care program needed by the patient, have an available bed and are willing to accept the patient.  Yes   Patient/family informed of [facility]'s ownership interest in [facility] and [facility], as well as of the fact that they are under no obligation to receive care at these facilities.  PASRR submitted to EDS on       PASRR number received on 12/23/14     Existing PASRR number confirmed on       FL2 transmitted to all facilities in geographic area requested by pt/family on 12/28/14     FL2 transmitted to all facilities within larger geographic area on       Patient informed that his/her managed care company has contracts with or will negotiate with certain facilities, including the following:        Yes   Patient/family informed of bed offers received.  Patient chooses bed at [facility]     Physician recommends and patient chooses bed at      Patient to be transferred to [facility] on 12/31/14.  Patient to be transferred to facility by Ambulance     Patient family notified on 12/31/14 of transfer.  Name of family member notified:  Patient son notified by RN     PHYSICIAN Please sign FL2, Please prepare prescriptions     Additional  Comment:    Barbette Or, [place]

## 2014-12-31 NOTE — Progress Notes (Signed)
Informed Son Judith Boyer that pt was leaving hospital with [facility]. Pt in stable condition, on o2. VSS. Report given to [facility] as well.

## 2014-12-31 NOTE — Discharge Summary (Addendum)
Judith Boyer, is a 69 y.o. female  DOB 1945/06/14  MRN [id number].  Admission date:  12/20/2014  Admitting Physician  Waldemar Dickens, MD  Discharge Date:  12/31/2014   Primary MD  Jenny Reichmann, MD  Recommendations for primary care physician for things to follow:  - Please check CBC, BMP in 3 days. - Please follow with pulmonary in 1-2 weeks after discharge. - Patient to continue CPAP at bedtime, with pressure support 5-15 mmhg  Admission Diagnosis  Acute exacerbation of chronic obstructive pulmonary disease (COPD) ([place]) [J44.1]   Discharge Diagnosis  Acute exacerbation of chronic obstructive pulmonary disease (COPD) ([place]) [J44.1]   Principal Problem:   Acute on chronic respiratory failure with hypoxia and hypercapnia (HCC) Active Problems:   COPD GOLD III   Hypertension   COPD exacerbation (HCC)   Weakness generalized   Protein-calorie malnutrition, severe (HCC)   Anxiety   Dyspnea   Acute exacerbation of chronic obstructive pulmonary disease (COPD) ([place])   Diabetes mellitus with complication (HCC)   Acute on chronic respiratory failure with hypoxia (HCC)   Tobacco abuse   Noncompliance   Hyperkalemia   DNR (do not resuscitate) discussion      Past Medical History  Diagnosis Date  . COPD (chronic obstructive pulmonary disease) ([place])   . Hypertension   . Coronary artery disease   . Shortness of breath   . High cholesterol   . Myocardial infarction ([place]) 02/2011  . On home oxygen therapy     "2L; 24h/day" (12/20/2014)  . Pneumonia     "couple times" (02/08/2014)  . Type II diabetes mellitus ([place])     type 2  . Anemia   . History of blood transfusion 2014    "extremely anemic"    Past Surgical History  Procedure Laterality Date  . Shoulder arthroscopy w/ rotator cuff repair Left   . Left heart catheterization with coronary angiogram N/A 02/09/2011    Procedure: LEFT HEART  CATHETERIZATION WITH CORONARY ANGIOGRAM;  Surgeon: Birdie Riddle, MD;  Location: [facility] CATH [facility];  Service: Cardiovascular;  Laterality: N/A;  . Tonsillectomy    . Abdominal hysterectomy  1970's  . Coronary angioplasty with stent placement  12/2007    "2"  . Cardiac catheterization  02/2011       History of present illness and  Hospital Course:     Kindly see H&P for history of present illness and admission details, please review complete Labs, Consult reports and Test reports for all details in brief  HPI  from the history and physical done on the day of admission 12/20/2014  Judith Boyer is a 69 y.o. female with COPD. Patient was recently hospitalized with acute on chronic respiratory failure. Her breathing was slow to improve and given advanced disease Palliative Medicine was consulted. Palliative care team attempted to discuss CODE STATUS with the patient on multiple occasions but she apparently refused. Patient has been on a tapering dose of steroids since discharge in August, currently taking 20mg  of  prednisone daily. She is worried about blood sugars being high.   Patient has been becoming increasingly short of breath at home over the last week. Pulmonary gave her an office appointment for Thursday but patient felt she could not wait. Patient has been coughing up excessive amount of clear mucus. She feels her chest is very congested despite twice-daily Mucinex. Hears herself wheezing. No fevers. She was unable to use inhaler at home today due to significant shortness of breath. No complaints of peripheral edema.   Hospital Course  69 year old female former smoker, severe COPD, presented with complaints of productive cough and dyspnea started on treatment for COPD acute exacerbation, was slow to improve,  prolonged hospital stay due to need of IV steroids, pulmonary been following, cut to transition to oral prednisone.   Acute on chronic respiratory failure with hypoxia/hypercapnia  secondary to acute COPD exacerbation - Patient initially on IV steroids, tapered gradually, seen by pulmonary, adjusted her medication, started on profile, and Pulmicort, Symbicort has been stopped, required BiPAP, the plan was to discharge patient on BiPAP, patient was intolerant to BiPAP noncompliant at nighttime, so we'll discharge on CPAP, with plan for leaving study as an outpatient as discussed with pulmonary. - to continue with prolonged steroid taper 30 mg 1 week, 30 mg 1 week, then 20 mg oral daily until seen by Dr. Melvyn Novas.  Sinus tachycardia/hypertension - January Cardizem 90 mg oral 3 times a day  Diabetes mellitus, uncontrolled - Insulin regimen has been adjusted during hospital stay, continue with NPH 15 units twice a day, NovoLog 10 units 3 times a day before meals, and insulin sliding scale.  History of coronary artery disease - Continue with Plavix  Discharge Condition:  Stable   Follow UP  Follow-up Information    Follow up with DAUB, STEVE A, MD. Schedule an appointment as soon as possible for a visit in 1 week.   Specialty:  Family Medicine   Why:  After discharge from SNF   Contact information:   [place] [id number] [phone]       Follow up with Christinia Gully, MD. Schedule an appointment as soon as possible for a visit in 2 weeks.   Specialty:  Pulmonary Disease   Why:  COPD   Contact information:   [address] [name] [address] [phone]         Discharge Instructions  and  Discharge Medications         Discharge Instructions    Discharge instructions    Complete by:  As directed   Follow with Primary MD DAUB, STEVE A, MD in after discharge from SNF  Get CBC, CMP, 2 view Chest X ray checked  by Primary MD next visit.    Activity: As tolerated with Full fall precautions use walker/cane & assistance as needed   Disposition SNF   Diet: Heart Healthy , carbohydrate modified , with feeding assistance and  aspiration precautions.  For Heart failure patients - Check your Weight same time everyday, if you gain over 2 pounds, or you develop in leg swelling, experience more shortness of breath or chest pain, call your Primary MD immediately. Follow Cardiac Low Salt Diet and 1.5 lit/day fluid restriction.   On your next visit with your primary care physician please Get Medicines reviewed and adjusted.   Please request your Prim.MD to go over all Hospital Tests and Procedure/Radiological results at the follow up, please get all Hospital records sent to your Prim MD  by signing hospital release before you go home.   If you experience worsening of your admission symptoms, develop shortness of breath, life threatening emergency, suicidal or homicidal thoughts you must seek medical attention immediately by calling 911 or calling your MD immediately  if symptoms less severe.  You Must read complete instructions/literature along with all the possible adverse reactions/side effects for all the Medicines you take and that have been prescribed to you. Take any new Medicines after you have completely understood and accpet all the possible adverse reactions/side effects.   Do not drive, operating heavy machinery, perform activities at heights, swimming or participation in water activities or provide baby sitting services if your were admitted for syncope or siezures until you have seen by Primary MD or a Neurologist and advised to do so again.  Do not drive when taking Pain medications.    Do not take more than prescribed Pain, Sleep and Anxiety Medications  Special Instructions: If you have smoked or chewed Tobacco  in the last 2 yrs please stop smoking, stop any regular Alcohol  and or any Recreational drug use.  Wear Seat belts while driving.   Please note  You were cared for by a hospitalist during your hospital stay. If you have any questions about your discharge medications or the care you received  while you were in the hospital after you are discharged, you can call the unit and asked to speak with the hospitalist on call if the hospitalist that took care of you is not available. Once you are discharged, your primary care physician will handle any further medical issues. Please note that NO REFILLS for any discharge medications will be authorized once you are discharged, as it is imperative that you return to your primary care physician (or establish a relationship with a primary care physician if you do not have one) for your aftercare needs so that they can reassess your need for medications and monitor your lab values.     Increase activity slowly    Complete by:  As directed             Medication List    STOP taking these medications        budesonide-formoterol 160-4.5 MCG/ACT inhaler  Commonly known as:  SYMBICORT     Iron Tabs     metFORMIN 500 MG tablet  Commonly known as:  GLUCOPHAGE     Tiotropium Bromide Monohydrate 2.5 MCG/ACT Aers  Commonly known as:  SPIRIVA RESPIMAT      TAKE these medications        acetaminophen 325 MG tablet  Commonly known as:  TYLENOL  Take 2 tablets (650 mg total) by mouth every 6 (six) hours as needed for mild pain (or Fever >/= 101).     albuterol (2.5 MG/3ML) 0.083% nebulizer solution  Commonly known as:  PROVENTIL  Take 3 mLs (2.5 mg total) by nebulization every 4 (four) hours as needed for wheezing or shortness of breath.     ALPRAZolam 0.5 MG tablet  Commonly known as:  XANAX  Take 1 tablet (0.5 mg total) by mouth 3 (three) times daily as needed for anxiety.     arformoterol 15 MCG/2ML Nebu  Commonly known as:  BROVANA  Take 2 mLs (15 mcg total) by nebulization 2 (two) times daily.     bisacodyl 10 MG suppository  Commonly known as:  DULCOLAX  Place 1 suppository (10 mg total) rectally daily as needed for moderate constipation.  budesonide 0.5 MG/2ML nebulizer solution  Commonly known as:  PULMICORT  Take 2 mLs  (0.5 mg total) by nebulization 2 (two) times daily.     clopidogrel 75 MG tablet  Commonly known as:  PLAVIX  Take 1 tablet (75 mg total) by mouth every morning.     diltiazem 90 MG tablet  Commonly known as:  CARDIZEM  Take 1 tablet (90 mg total) by mouth every 8 (eight) hours.     feeding supplement (GLUCERNA SHAKE) Liqd  Take 237 mLs by mouth 3 (three) times daily between meals.     insulin aspart 100 UNIT/ML injection  Commonly known as:  novoLOG  Inject 0-20 Units into the skin 3 (three) times daily with meals.     insulin aspart 100 UNIT/ML injection  Commonly known as:  novoLOG  Inject 0-5 Units into the skin at bedtime.     insulin aspart 100 UNIT/ML injection  Commonly known as:  novoLOG  Inject 10 Units into the skin 3 (three) times daily with meals.     insulin NPH Human 100 UNIT/ML injection  Commonly known as:  HUMULIN N,NOVOLIN N  Inject 0.15 mLs (15 Units total) into the skin 2 (two) times daily at 8 am and 10 pm.     ipratropium-albuterol 0.5-2.5 (3) MG/3ML Soln  Commonly known as:  DUONEB  Take 3 mLs by nebulization 4 (four) times daily - after meals and at bedtime.     montelukast 10 MG tablet  Commonly known as:  SINGULAIR  take 1 tablet by mouth once daily     MUCINEX MAXIMUM STRENGTH 1200 MG Tb12  Generic drug:  Guaifenesin  Take 1,200 mg by mouth 2 (two) times daily.     nitroGLYCERIN 0.4 MG SL tablet  Commonly known as:  NITROSTAT  Place 1 tablet (0.4 mg total) under the tongue every 5 (five) minutes x 3 doses as needed for chest pain.     OXYGEN  Inhale 2 L into the lungs continuous.     pravastatin 80 MG tablet  Commonly known as:  PRAVACHOL  Take 1 tablet (80 mg total) by mouth every evening.     predniSONE 10 MG tablet  Commonly known as:  DELTASONE  Please take 40 mg oral daily for 1 week, then 30 mg oral daily for 1 week, then 20 mg oral daily thereafter .     promethazine-codeine 6.25-10 MG/5ML syrup  Commonly known as:  PHENERGAN  with CODEINE  Take 5 mLs by mouth 2 (two) times daily. for 1 week     senna-docusate 8.6-50 MG tablet  Commonly known as:  Senokot-S  Take 2 tablets by mouth at bedtime.     sodium chloride 0.65 % Soln nasal spray  Commonly known as:  OCEAN  Place 1 spray into both nostrils as needed for congestion.     valsartan 80 MG tablet  Commonly known as:  DIOVAN  Take 1 tablet (80 mg total) by mouth daily.          Diet and Activity recommendation: See Discharge Instructions above   Consults obtained -  Pulmonary Palliative care  Major procedures and Radiology Reports - PLEASE review detailed and final reports for all details, in brief -      Dg Chest Port 1 View  12/25/2014  CLINICAL DATA:  Acute dyspnea. History of hypertension, diabetes and COPD. EXAM: PORTABLE CHEST 1 VIEW COMPARISON:  12/20/2014 FINDINGS: Lungs are hyperexpanded, but clear. No pleural effusion or pneumothorax.  Cardiac silhouette is top-normal in size. No mediastinal or hilar masses or evidence of adenopathy. Bony thorax is demineralized but grossly intact. IMPRESSION: 1. No acute cardiopulmonary disease. 2. Hyperexpanded lungs consistent with COPD. Electronically Signed   By: Lajean Manes M.D.   On: 12/25/2014 13:09   Dg Chest Port 1 View  12/20/2014  CLINICAL DATA:  Shortness of breath and cough for 3 days EXAM: PORTABLE CHEST 1 VIEW COMPARISON:  09/09/2014 FINDINGS: The heart size and mediastinal contours are within normal limits. Both lungs are clear. The visualized skeletal structures are unremarkable. IMPRESSION: No active disease. Electronically Signed   By: Kerby Moors M.D.   On: 12/20/2014 13:21    Micro Results     No results found for this or any previous visit (from the past 240 hour(s)).     Today   Subjective:   Judith Boyer today has no headache,no chest abdominal pain,no new weakness , reports dyspnea at bedtime, complaints of dry cough overnight, was not able to tolerate  BiPAP overnight. Objective:   Blood pressure 141/52, pulse 98, temperature 97.9 F (36.6 C), temperature source Oral, resp. rate 27, height 5\' 1"  (1.549 m), weight 44.7 kg (98 lb 8.7 oz), SpO2 99 %.   Intake/Output Summary (Last 24 hours) at 12/31/14 1235 Last data filed at 12/31/14 1059  Gross per 24 hour  Intake    300 ml  Output   1175 ml  Net   -875 ml    Exam Awake Alert, Oriented x 3, No new F.N deficits Supple Neck,No JVD, No cervical lymphadenopathy appriciated.  Symmetrical Chest wall movement, no use of accessory muscle, no active wheezing RRR,No Gallops,Rubs or new Murmurs, No Parasternal Heave +ve B.Sounds, Abd Soft, Non tender, No organomegaly appriciated No Cyanosis, Clubbing or edema, No new Rash or bruise  Data Review   CBC w Diff:  Lab Results  Component Value Date   WBC 10.6* 12/25/2014   WBC 10.4* 05/01/2014   HGB 11.2* 12/25/2014   HGB 12.7 05/01/2014   HCT 36.9 12/25/2014   HCT 41.6 05/01/2014   PLT 358 12/25/2014   LYMPHOPCT 4 12/24/2014   MONOPCT 4 12/24/2014   EOSPCT 0 12/24/2014   BASOPCT 0 12/24/2014    CMP:  Lab Results  Component Value Date   NA 140 12/26/2014   K 4.7 12/26/2014   CL 100* 12/26/2014   CO2 36* 12/26/2014   BUN 28* 12/26/2014   CREATININE 0.51 12/26/2014   CREATININE 0.46* 11/18/2014   PROT 5.7* 12/24/2014   ALBUMIN 3.0* 12/24/2014   BILITOT 0.1* 12/24/2014   ALKPHOS 49 12/24/2014   AST 20 12/24/2014   ALT 23 12/24/2014  .   Total Time in preparing paper work, data evaluation and todays exam - 35 minutes  Sandor Arboleda M.D on 12/31/2014 at 12:35 PM  [facility]   Office  [phone]

## 2014-12-31 NOTE — Progress Notes (Signed)
Report given to [place]. No further questions. I notified Son Hart Carwin that pt would be leaving hospital shortly and I would call him when [facility] got here. Will continue to monitor. Pt is in stable condition.

## 2014-12-31 NOTE — Clinical Social Work Note (Signed)
Clinical Social Worker facilitated patient discharge including RN contacting patient family and facility to confirm patient discharge plans.  Clinical information faxed to facility and family agreeable with plan.  CSW arranged ambulance transport via [facility] to [facility]  RN to call report prior to discharge.  Clinical Social Worker will sign off for now as social work intervention is no longer needed. Please consult [place] again if new need arises.  Barbette Or, [place]

## 2015-01-03 ENCOUNTER — Ambulatory Visit: Payer: Medicare Other | Admitting: Internal Medicine

## 2015-01-04 ENCOUNTER — Non-Acute Institutional Stay (SKILLED_NURSING_FACILITY): Payer: Medicare Other | Admitting: Internal Medicine

## 2015-01-04 DIAGNOSIS — E119 Type 2 diabetes mellitus without complications: Secondary | ICD-10-CM | POA: Diagnosis not present

## 2015-01-04 DIAGNOSIS — D638 Anemia in other chronic diseases classified elsewhere: Secondary | ICD-10-CM

## 2015-01-04 DIAGNOSIS — J9612 Chronic respiratory failure with hypercapnia: Secondary | ICD-10-CM

## 2015-01-04 DIAGNOSIS — K5900 Constipation, unspecified: Secondary | ICD-10-CM

## 2015-01-04 DIAGNOSIS — E43 Unspecified severe protein-calorie malnutrition: Secondary | ICD-10-CM | POA: Diagnosis not present

## 2015-01-04 DIAGNOSIS — R5381 Other malaise: Secondary | ICD-10-CM

## 2015-01-04 DIAGNOSIS — I2510 Atherosclerotic heart disease of native coronary artery without angina pectoris: Secondary | ICD-10-CM | POA: Diagnosis not present

## 2015-01-04 DIAGNOSIS — Z794 Long term (current) use of insulin: Secondary | ICD-10-CM | POA: Diagnosis not present

## 2015-01-04 DIAGNOSIS — D72829 Elevated white blood cell count, unspecified: Secondary | ICD-10-CM

## 2015-01-04 DIAGNOSIS — J441 Chronic obstructive pulmonary disease with (acute) exacerbation: Secondary | ICD-10-CM | POA: Diagnosis not present

## 2015-01-04 NOTE — Progress Notes (Signed)
Patient ID: Judith Boyer, female   DOB: [birth year], 69 y.o.   MRN: [id number]     Facility: [facility]    PCP: Jenny Reichmann, MD  Code Status: full code  Allergies  Allergen Reactions  . Doxycycline Anaphylaxis  . Codeine Other (See Comments)    hyperactivity  . Dulera [Mometasone Furo-Formoterol Fum] Hives and Rash  . Adhesive [Tape] Rash  . Latex Other (See Comments)    unknown  . Lisinopril Cough    Chief Complaint  Patient presents with  . New Admit To SNF     HPI:  69 y.o. patient is here for short term rehabilitation post hospital admission from 12/20/14-12/31/14 with acute respiratory failure from copd exacerbation. She required iv prednisone and bronchodilators. She was on BiPAP. She was weaned to po prednisone and CPAP. She has PMH of HTN, CAD, COPD, DM among others. She is seen in her room today. She had done some basic exercise with therapy team today when she got out of breath and had to sit down and was brought back to her room. On further review of her chronic medical issues, patient mentions that she has not been receiving prednisone in the facility. On further review with staff, prednisone has not been provided to the patient for unclear reason despite of being in the med cart. Her aunt is present at bedside.   Review of Systems:  Constitutional: positive for easy fatigue. Negative for fever, chills, diaphoresis.  HENT: Negative for headache, congestion, nasal discharge, difficulty swallowing.   Eyes: Negative for eye pain, blurred vision, double vision and discharge.  Respiratory: positive for cough, shortness of breath and wheezing.   Cardiovascular: Negative for chest pain, palpitations. Has noticed foot swelling since yesterday.  Gastrointestinal: Negative for heartburn, nausea, vomiting, abdominal pain. Had bowel movement today Genitourinary: Negative for dysuria, flank pain.  Musculoskeletal: Negative for back pain,  falls Skin: Negative for itching, rash.  Neurological: positive for weakness,dizziness with position change. Denies tingling, focal weakness Psychiatric/Behavioral: Negative for depression   Past Medical History  Diagnosis Date  . COPD (chronic obstructive pulmonary disease) ([place])   . Hypertension   . Coronary artery disease   . Shortness of breath   . High cholesterol   . Myocardial infarction ([place]) 02/2011  . On home oxygen therapy     "2L; 24h/day" (12/20/2014)  . Pneumonia     "couple times" (02/08/2014)  . Type II diabetes mellitus ([place])     type 2  . Anemia   . History of blood transfusion 2014    "extremely anemic"   Past Surgical History  Procedure Laterality Date  . Shoulder arthroscopy w/ rotator cuff repair Left   . Left heart catheterization with coronary angiogram N/A 02/09/2011    Procedure: LEFT HEART CATHETERIZATION WITH CORONARY ANGIOGRAM;  Surgeon: Birdie Riddle, MD;  Location: [facility];  Service: Cardiovascular;  Laterality: N/A;  . Tonsillectomy    . Abdominal hysterectomy  1970's  . Coronary angioplasty with stent placement  12/2007    "2"  . Cardiac catheterization  02/2011   Social History:   reports that she quit smoking about 1 years ago. Her smoking use included Cigarettes. She has a 50 pack-year smoking history. She has never used smokeless tobacco. She reports that she does not drink alcohol or use illicit drugs.  Family History  Problem Relation Age of Onset  . Heart disease Maternal Grandmother   . Brain cancer Brother   .  Lung cancer Mother     smoked    Medications:   Medication List       This list is accurate as of: 01/04/15  9:09 AM.  Always use your most recent med list.               acetaminophen 325 MG tablet  Commonly known as:  TYLENOL  Take 2 tablets (650 mg total) by mouth every 6 (six) hours as needed for mild pain (or Fever >/= 101).     albuterol (2.5 MG/3ML) 0.083% nebulizer solution  Commonly known as:   PROVENTIL  Take 3 mLs (2.5 mg total) by nebulization every 4 (four) hours as needed for wheezing or shortness of breath.     ALPRAZolam 0.5 MG tablet  Commonly known as:  XANAX  Take 1 tablet (0.5 mg total) by mouth 3 (three) times daily as needed for anxiety.     arformoterol 15 MCG/2ML Nebu  Commonly known as:  BROVANA  Take 2 mLs (15 mcg total) by nebulization 2 (two) times daily.     bisacodyl 10 MG suppository  Commonly known as:  DULCOLAX  Place 1 suppository (10 mg total) rectally daily as needed for moderate constipation.     budesonide 0.5 MG/2ML nebulizer solution  Commonly known as:  PULMICORT  Take 2 mLs (0.5 mg total) by nebulization 2 (two) times daily.     clopidogrel 75 MG tablet  Commonly known as:  PLAVIX  Take 1 tablet (75 mg total) by mouth every morning.     diltiazem 90 MG tablet  Commonly known as:  CARDIZEM  Take 1 tablet (90 mg total) by mouth every 8 (eight) hours.     feeding supplement (GLUCERNA SHAKE) Liqd  Take 237 mLs by mouth 3 (three) times daily between meals.     insulin aspart 100 UNIT/ML injection  Commonly known as:  novoLOG  Inject 0-20 Units into the skin 3 (three) times daily with meals.     insulin aspart 100 UNIT/ML injection  Commonly known as:  novoLOG  Inject 0-5 Units into the skin at bedtime.     insulin aspart 100 UNIT/ML injection  Commonly known as:  novoLOG  Inject 10 Units into the skin 3 (three) times daily with meals.     insulin NPH Human 100 UNIT/ML injection  Commonly known as:  HUMULIN N,NOVOLIN N  Inject 0.15 mLs (15 Units total) into the skin 2 (two) times daily at 8 am and 10 pm.     ipratropium-albuterol 0.5-2.5 (3) MG/3ML Soln  Commonly known as:  DUONEB  Take 3 mLs by nebulization 4 (four) times daily - after meals and at bedtime.     montelukast 10 MG tablet  Commonly known as:  SINGULAIR  take 1 tablet by mouth once daily     MUCINEX MAXIMUM STRENGTH 1200 MG Tb12  Generic drug:  Guaifenesin   Take 1,200 mg by mouth 2 (two) times daily.     nitroGLYCERIN 0.4 MG SL tablet  Commonly known as:  NITROSTAT  Place 1 tablet (0.4 mg total) under the tongue every 5 (five) minutes x 3 doses as needed for chest pain.     OXYGEN  Inhale 2 L into the lungs continuous.     pravastatin 80 MG tablet  Commonly known as:  PRAVACHOL  Take 1 tablet (80 mg total) by mouth every evening.     predniSONE 10 MG tablet  Commonly known as:  DELTASONE  Please take  40 mg oral daily for 1 week, then 30 mg oral daily for 1 week, then 20 mg oral daily thereafter .     promethazine-codeine 6.25-10 MG/5ML syrup  Commonly known as:  PHENERGAN with CODEINE  Take 5 mLs by mouth 2 (two) times daily. for 1 week     senna-docusate 8.6-50 MG tablet  Commonly known as:  Senokot-S  Take 2 tablets by mouth at bedtime.     sodium chloride 0.65 % Soln nasal spray  Commonly known as:  OCEAN  Place 1 spray into both nostrils as needed for congestion.     valsartan 80 MG tablet  Commonly known as:  DIOVAN  Take 1 tablet (80 mg total) by mouth daily.         Physical Exam Filed Vitals:   01/04/15 0907  BP: 138/60  Pulse: 62  Temp: 98.1 F (36.7 C)  Resp: 19    General- elderly female, well built, chronically ill appearing, in mild distress Head- normocephalic, atraumatic Nose- normal nasal mucosa, no maxillary or frontal sinus tenderness, no nasal discharge Throat- moist mucus membrane  Eyes- PERRLA, EOMI, no pallor, no icterus, no discharge, normal conjunctiva, normal sclera Neck- no cervical lymphadenopathy, no thyromegaly, no jugular vein distension Cardiovascular- tachycardic, palpable dorsalis pedis and radial pulses, no leg edema but has trace foot edema Respiratory- bilateral poor air movement, expiratory wheeze scattered, no rhonchi or crackles, on o2 and not using her accessory muscles Abdomen- bowel sounds present, soft, non tender Musculoskeletal- able to move all 4 extremities, on  wheelchair, generalized weakness  Neurological- no focal deficit, alert and oriented to person, place and time Skin- warm and dry Psychiatry- normal mood and affect    Labs reviewed: Basic Metabolic Panel:  Recent Labs  12/22/14 0240 12/23/14 0310 12/24/14 0410 12/25/14 0254 12/26/14 0335  NA 139 140 141 137 140  K 4.1 4.4 4.7 5.2* 4.7  CL 95* 98* 99* 96* 100*  CO2 33* 35* 38* 32 36*  GLUCOSE 161* 139* 200* 261* 236*  BUN 24* 20 22* 22* 28*  CREATININE 0.52 0.50 0.55 0.53 0.51  CALCIUM 8.8* 8.7* 8.8* 9.0 8.9  MG 2.1 2.2 2.2  --   --    Liver Function Tests:  Recent Labs  12/22/14 0240 12/23/14 0310 12/24/14 0410  AST 18 18 20   ALT 19 20 23   ALKPHOS 48 49 49  BILITOT 0.4 0.2* 0.1*  PROT 5.4* 5.8* 5.7*  ALBUMIN 3.1* 3.0* 3.0*   No results for input(s): LIPASE, AMYLASE in the last 8760 hours. No results for input(s): AMMONIA in the last 8760 hours. CBC:  Recent Labs  12/22/14 0240 12/23/14 0310 12/24/14 0410 12/25/14 0254  WBC 17.8* 11.7* 15.5* 10.6*  NEUTROABS 16.8* 10.9* 14.2*  --   HGB 11.7* 11.5* 11.0* 11.2*  HCT 38.0 37.0 36.5 36.9  MCV 93.1 93.4 94.1 93.9  PLT 332 330 383 358   Cardiac Enzymes:  Recent Labs  06/18/14 1740 09/08/14 1930  TROPONINI <0.03 <0.03   BNP: Invalid input(s): POCBNP CBG:  Recent Labs  12/30/14 2229 12/31/14 0750 12/31/14 1152  GLUCAP 267* 162* 203*   Lab Results  Component Value Date   HGBA1C 8.3 11/18/2014     Radiological Exams: Dg Chest Port 1 View  12/20/2014  CLINICAL DATA:  Shortness of breath and cough for 3 days EXAM: PORTABLE CHEST 1 VIEW COMPARISON:  09/09/2014 FINDINGS: The heart size and mediastinal contours are within normal limits. Both lungs are clear. The visualized  skeletal structures are unremarkable. IMPRESSION: No active disease. Electronically Signed   By: Kerby Moors M.D.   On: 12/20/2014 13:21    Assessment/Plan  Physical deconditioning Will have her work with physical  therapy and occupational therapy team to help with gait training and muscle strengthening exercises.fall precautions. Skin care. Encourage to be out of bed. Poor overall prognosis given her advanced COPD and severe deconditioning. Will need care plan meeting to review goals of care if no improvement made. At present, HR coming down with rest.   Acute on chronic copd exacerbation Has not been receiving her prednisone. Start prednisone 40 mg daily x 1 week, then 30 mg daily x 1 week, then leave at 20 mg daily chronic prednisone for now. Continue duoneb qid and o2 3l/min. Continue brovana and pulmicort nebuliser. Continue prn albuterol neb for rescue inhaler.   Chronic respiratory failure With her copd, continue o2 for now and her breathing treatment as above  Protein calorie malnutrition Monitor PO intake and weight. Continue protein supplement. Get dietary consult  Leukocytosis With her being on prednisone. Monitor temp and wbc curve  Anemia With her chronic disease, monitor cbc  CAD Remains chest pain free. Continue diltiazem 90 mg tid, losartan 50 mg in place of diovan for formulary reason with plavix 75 mg daily and prn NTG. Continue statin  DM Lab Results  Component Value Date   HGBA1C 8.3 11/18/2014  discontinue humulin 15 u bid order. Continue novolog 10 u tid with SSI with meals for now. Monitor cbg and readjust dosing if needed  Constipation Continue senokot s 2 tab qhs with prn dulcolax suppository   Goals of care: short term rehabilitation   Labs/tests ordered: cbc with diff, cmp  Family/ staff Communication: reviewed care plan with patient and nursing supervisor    Blanchie Serve, MD  [facility] [phone] (Monday-Friday 8 am - 5 pm) [phone] (afterhours)

## 2015-01-10 LAB — CBC AND DIFFERENTIAL
HCT: 30 % — AB (ref 36–46)
Hemoglobin: 8.8 g/dL — AB (ref 12.0–16.0)
Platelets: 258 10*3/uL (ref 150–399)
WBC: 12.9 10*3/mL

## 2015-01-10 LAB — BASIC METABOLIC PANEL
BUN: 15 mg/dL (ref 4–21)
CREATININE: 0.3 mg/dL — AB (ref 0.5–1.1)
GLUCOSE: 174 mg/dL
POTASSIUM: 4.1 mmol/L (ref 3.4–5.3)
Sodium: 144 mmol/L (ref 137–147)

## 2015-01-10 LAB — HEPATIC FUNCTION PANEL
ALK PHOS: 74 U/L (ref 25–125)
ALT: 24 U/L (ref 7–35)
AST: 13 U/L (ref 13–35)
Bilirubin, Total: 0.2 mg/dL

## 2015-01-11 ENCOUNTER — Non-Acute Institutional Stay (SKILLED_NURSING_FACILITY): Payer: Medicare Other | Admitting: Internal Medicine

## 2015-01-11 ENCOUNTER — Encounter: Payer: Self-pay | Admitting: Internal Medicine

## 2015-01-11 DIAGNOSIS — I959 Hypotension, unspecified: Secondary | ICD-10-CM | POA: Diagnosis not present

## 2015-01-11 DIAGNOSIS — I2510 Atherosclerotic heart disease of native coronary artery without angina pectoris: Secondary | ICD-10-CM

## 2015-01-11 DIAGNOSIS — J9611 Chronic respiratory failure with hypoxia: Secondary | ICD-10-CM | POA: Diagnosis not present

## 2015-01-11 DIAGNOSIS — D72829 Elevated white blood cell count, unspecified: Secondary | ICD-10-CM

## 2015-01-11 DIAGNOSIS — D62 Acute posthemorrhagic anemia: Secondary | ICD-10-CM

## 2015-01-11 NOTE — Progress Notes (Signed)
Patient ID: Judith Boyer, female   DOB: [birth year], 69 y.o.   MRN: [id number]     Facility: [facility]    PCP: Jenny Reichmann, MD  Code Status: Full Code  Allergies  Allergen Reactions  . Doxycycline Anaphylaxis  . Codeine Other (See Comments)    hyperactivity  . Dulera [Mometasone Furo-Formoterol Fum] Hives and Rash  . Adhesive [Tape] Rash  . Latex Other (See Comments)    unknown  . Lisinopril Cough    Chief Complaint  Patient presents with  . Acute Visit    Follow-up on labs, decreased hemoglobin      HPI:  69 y.o. patient is seen with acute concern. She has a drop in her hemoglobin level. She is here for short term rehabilitation post hospital admission with acute respiratory failure from copd exacerbation. She has PMH of HTN, CAD, COPD, DM among others. She is seen in her room today. She mentions having her hemoglobin dropped in the past with her on plavix and there was concern for gi bleed. She could not undergo endoscopy and colonoscopy with her chronic respiratory failure. At that point she was put on iron and plavix was held and she required blood transfusion. She complaints of ongoing dyspnea. Denies any chest pain, nausea or vomiting. Complaints of occasional dark colored stool but denies any frank blood in stool.   Review of Systems:  Constitutional: positive for easy fatigue. Negative for fever, chills, diaphoresis.  HENT: Negative for headache, congestion, nasal discharge, difficulty swallowing.   Eyes: Negative for eye pain, blurred vision, double vision and discharge.  Respiratory: positive for cough, shortness of breath and wheezing.   Cardiovascular: Negative for chest pain, palpitations. Has some swelling to her feet  Gastrointestinal: Negative for heartburn, nausea, vomiting, abdominal pain. Had bowel movement today Genitourinary: Negative for dysuria, flank pain.  Musculoskeletal: Negative for back pain, falls Skin:  Negative for itching, rash.  Neurological: positive for weakness. Denies tingling, focal weakness Psychiatric/Behavioral: Negative for depression   Past Medical History  Diagnosis Date  . COPD (chronic obstructive pulmonary disease) ([place])   . Hypertension   . Coronary artery disease   . Shortness of breath   . High cholesterol   . Myocardial infarction ([place]) 02/2011  . On home oxygen therapy     "2L; 24h/day" (12/20/2014)  . Pneumonia     "couple times" (02/08/2014)  . Type II diabetes mellitus ([place])     type 2  . Anemia   . History of blood transfusion 2014    "extremely anemic"     Medications:   Medication List       This list is accurate as of: 01/11/15 11:35 AM.  Always use your most recent med list.               acetaminophen 325 MG tablet  Commonly known as:  TYLENOL  Take 2 tablets (650 mg total) by mouth every 6 (six) hours as needed for mild pain (or Fever >/= 101).     ALPRAZolam 0.5 MG tablet  Commonly known as:  XANAX  Take 1 tablet (0.5 mg total) by mouth 3 (three) times daily as needed for anxiety.     arformoterol 15 MCG/2ML Nebu  Commonly known as:  BROVANA  Take 2 mLs (15 mcg total) by nebulization 2 (two) times daily.     atorvastatin 10 MG tablet  Commonly known as:  LIPITOR  Take 10 mg by mouth daily.  bisacodyl 10 MG suppository  Commonly known as:  DULCOLAX  Place 1 suppository (10 mg total) rectally daily as needed for moderate constipation.     clopidogrel 75 MG tablet  Commonly known as:  PLAVIX  Take 1 tablet (75 mg total) by mouth every morning.     diltiazem 90 MG tablet  Commonly known as:  CARDIZEM  Take 1 tablet (90 mg total) by mouth every 8 (eight) hours.     feeding supplement (GLUCERNA SHAKE) Liqd  Take 237 mLs by mouth 3 (three) times daily between meals.     insulin aspart 100 UNIT/ML injection  Commonly known as:  novoLOG  TID AC Inject per sliding scale  <60= hypoglycemic protocol, 60-149=0 units, 150-250=5  units, 251-300=8 units, 301-350=10 units > 351 contact MD     insulin aspart 100 UNIT/ML injection  Commonly known as:  novoLOG  Inject 10 Units into the skin 3 (three) times daily with meals.     ipratropium-albuterol 0.5-2.5 (3) MG/3ML Soln  Commonly known as:  DUONEB  Take 3 mLs by nebulization 4 (four) times daily - after meals and at bedtime.     losartan 50 MG tablet  Commonly known as:  COZAAR  Take 50 mg by mouth daily.     Melatonin 3 MG Tabs  Take 3 mg by mouth at bedtime.     montelukast 10 MG tablet  Commonly known as:  SINGULAIR  take 1 tablet by mouth once daily     MUCINEX MAXIMUM STRENGTH 1200 MG Tb12  Generic drug:  Guaifenesin  Take 1,200 mg by mouth 2 (two) times daily.     nitroGLYCERIN 0.4 MG SL tablet  Commonly known as:  NITROSTAT  Place 1 tablet (0.4 mg total) under the tongue every 5 (five) minutes x 3 doses as needed for chest pain.     OXYGEN  Inhale 2 L into the lungs continuous.     predniSONE 10 MG tablet  Commonly known as:  DELTASONE  Please take 40 mg oral daily for 1 week, then 30 mg oral daily for 1 week, then 20 mg oral daily thereafter .     senna-docusate 8.6-50 MG tablet  Commonly known as:  Senokot-S  Take 2 tablets by mouth at bedtime.     sodium chloride 0.65 % Soln nasal spray  Commonly known as:  OCEAN  Place 1 spray into both nostrils as needed for congestion.         Physical Exam Filed Vitals:   01/11/15 1114  BP: 106/53  Pulse: 95  Temp: 97 F (36.1 C)  TempSrc: Oral  Resp: 21  SpO2: 98%    General- elderly female, chronically ill appearing, in mild distress Head- normocephalic, atraumatic Nose- normal nasal mucosa, no maxillary or frontal sinus tenderness, no nasal discharge Throat- moist mucus membrane  Eyes- PERRLA, EOMI, no pallor, no icterus, no discharge, normal conjunctiva, normal sclera Neck- no cervical lymphadenopathy, no thyromegaly, no jugular vein distension Cardiovascular- palpable dorsalis  pedis and radial pulses, no leg edema but has trace foot edema Respiratory- bilateral poor air movement, no wheeze, rhonchi or crackles, on o2 and not using her accessory muscles Abdomen- bowel sounds present, soft, non tender Musculoskeletal- able to move all 4 extremities, on wheelchair, generalized weakness  Neurological- no focal deficit, alert and oriented to person, place and time Skin- warm and dry, easy bruising Psychiatry- normal mood and affect    Labs reviewed: Basic Metabolic Panel:  Recent Labs  12/22/14  [id number] 12/23/14 0310 12/24/14 0410 12/25/14 0254 12/26/14 0335 01/10/15  NA 139 140 141 137 140 144  K 4.1 4.4 4.7 5.2* 4.7 4.1  CL 95* 98* 99* 96* 100*  --   CO2 33* 35* 38* 32 36*  --   GLUCOSE 161* 139* 200* 261* 236*  --   BUN 24* 20 22* 22* 28* 15  CREATININE 0.52 0.50 0.55 0.53 0.51 0.3*  CALCIUM 8.8* 8.7* 8.8* 9.0 8.9  --   MG 2.1 2.2 2.2  --   --   --    Liver Function Tests:  Recent Labs  12/22/14 0240 12/23/14 0310 12/24/14 0410 01/10/15  AST 18 18 20 13   ALT 19 20 23 24   ALKPHOS 48 49 49 74  BILITOT 0.4 0.2* 0.1*  --   PROT 5.4* 5.8* 5.7*  --   ALBUMIN 3.1* 3.0* 3.0*  --    No results for input(s): LIPASE, AMYLASE in the last 8760 hours. No results for input(s): AMMONIA in the last 8760 hours. CBC:  Recent Labs  12/22/14 0240 12/23/14 0310 12/24/14 0410 12/25/14 0254 01/10/15  WBC 17.8* 11.7* 15.5* 10.6* 12.9  NEUTROABS 16.8* 10.9* 14.2*  --   --   HGB 11.7* 11.5* 11.0* 11.2* 8.8*  HCT 38.0 37.0 36.5 36.9 30*  MCV 93.1 93.4 94.1 93.9  --   PLT 332 330 383 358 258   Cardiac Enzymes:  Recent Labs  06/18/14 1740 09/08/14 1930  TROPONINI <0.03 <0.03   BNP: Invalid input(s): POCBNP CBG:  Recent Labs  12/30/14 2229 12/31/14 0750 12/31/14 1152  GLUCAP 267* 162* 203*   Lab Results  Component Value Date   HGBA1C 8.3 11/18/2014     Assessment/Plan  Anemia Has chronic diseases which could contribute some but given  acute drop in Hb concern for gi bleed. She is on plavix and chronic prednisone which can increase risk for bleed. Will get guaiac stool x 3. Will need to start her on iron 325 mg bid and pantoprazole 40 mg bid for now. Hold plavix until guaiac stool is resulted, check cbc 01/13/15. Check orthostatic vitals daily x 3 days  Leukocytosis Persists but improved. On chronic prednisone which is likely contributing to this. Her breathing has improved.   Chronic respiratory failure With her copd, continue o2 for now and her breathing treatment  CAD Remains chest pain free. Continue diltiazem 90 mg tid and prn NTG. Given low BP, decrease losartan to 25 mg daily.  Hold plavix for now  hypotension Soft bp reading, is on bp meds. Possible gi bleed contributing to this. decrease losartan as above. Continue diltiazem with holding parameters and monitor. Rule out orthostasis.   Goals of care: short term rehabilitation   Labs/tests ordered: cbc 01/13/15  Family/ staff Communication: reviewed care plan with patient and nursing supervisor    Blanchie Serve, MD  [facility] [phone] (Monday-Friday 8 am - 5 pm) [phone] (afterhours)

## 2015-01-18 ENCOUNTER — Non-Acute Institutional Stay (SKILLED_NURSING_FACILITY): Payer: Medicare Other | Admitting: Internal Medicine

## 2015-01-18 DIAGNOSIS — R0600 Dyspnea, unspecified: Secondary | ICD-10-CM | POA: Diagnosis not present

## 2015-01-18 DIAGNOSIS — R635 Abnormal weight gain: Secondary | ICD-10-CM | POA: Diagnosis not present

## 2015-01-18 DIAGNOSIS — D6489 Other specified anemias: Secondary | ICD-10-CM

## 2015-01-18 DIAGNOSIS — J449 Chronic obstructive pulmonary disease, unspecified: Secondary | ICD-10-CM

## 2015-01-18 DIAGNOSIS — D649 Anemia, unspecified: Secondary | ICD-10-CM

## 2015-01-18 NOTE — Progress Notes (Signed)
Patient ID: Judith Boyer, female   DOB: Jun 11, 1945, 69 y.o.   MRN: [id number]     Facility: [facility]    PCP: Jenny Reichmann, MD  Code Status: Full Code  Allergies  Allergen Reactions  . Doxycycline Anaphylaxis  . Codeine Other (See Comments)    hyperactivity  . Dulera [Mometasone Furo-Formoterol Fum] Hives and Rash  . Adhesive [Tape] Rash  . Latex Other (See Comments)    unknown  . Lisinopril Cough    Chief Complaint  Patient presents with  . Acute Visit    dyspnea, weight gain     HPI:  69 y.o. patient is seen with acute concern. She has been gaining weight and complaints of dyspnea. She also has been anxious. Her Hb level continues to drop. Her guaiac stool is positive. She has PMH of HTN, CAD, COPD, DM among others. She is on plavix. Denies rectal bleed. Hb today is 8.  Review of Systems:  Constitutional: positive for easy fatigue. Negative for fever HENT: Negative for headache   Eyes: Negative for blurred vision Respiratory: positive for dyspnea   Cardiovascular: Negative for chest pain, palpitations. Has some swelling to her feet but no increased edema Gastrointestinal: Negative for heartburn, nausea, vomiting, abdominal pain.  Genitourinary: Negative for dysuria Neurological: positive for weakness.  Psychiatric/Behavioral: Negative for depression   Past Medical History  Diagnosis Date  . COPD (chronic obstructive pulmonary disease) ([place])   . Hypertension   . Coronary artery disease   . Shortness of breath   . High cholesterol   . Myocardial infarction ([place]) 02/2011  . On home oxygen therapy     "2L; 24h/day" (12/20/2014)  . Pneumonia     "couple times" (02/08/2014)  . Type II diabetes mellitus ([place])     type 2  . Anemia   . History of blood transfusion 2014    "extremely anemic"     Medications:   Medication List       This list is accurate as of: 01/18/15  2:14 PM.  Always use your most recent med list.               acetaminophen 325 MG tablet  Commonly known as:  TYLENOL  Take 2 tablets (650 mg total) by mouth every 6 (six) hours as needed for mild pain (or Fever >/= 101).     ALPRAZolam 0.5 MG tablet  Commonly known as:  XANAX  Take 1 tablet (0.5 mg total) by mouth 3 (three) times daily as needed for anxiety.     arformoterol 15 MCG/2ML Nebu  Commonly known as:  BROVANA  Take 2 mLs (15 mcg total) by nebulization 2 (two) times daily.     atorvastatin 10 MG tablet  Commonly known as:  LIPITOR  Take 10 mg by mouth daily.     bisacodyl 10 MG suppository  Commonly known as:  DULCOLAX  Place 1 suppository (10 mg total) rectally daily as needed for moderate constipation.     clopidogrel 75 MG tablet  Commonly known as:  PLAVIX  Take 1 tablet (75 mg total) by mouth every morning.     diltiazem 90 MG tablet  Commonly known as:  CARDIZEM  Take 1 tablet (90 mg total) by mouth every 8 (eight) hours.     feeding supplement (GLUCERNA SHAKE) Liqd  Take 237 mLs by mouth 3 (three) times daily between meals.     insulin aspart 100 UNIT/ML injection  Commonly known  as:  novoLOG  TID AC Inject per sliding scale  <60= hypoglycemic protocol, 60-149=0 units, 150-250=5 units, 251-300=8 units, 301-350=10 units > 351 contact MD     insulin aspart 100 UNIT/ML injection  Commonly known as:  novoLOG  Inject 10 Units into the skin 3 (three) times daily with meals.     ipratropium-albuterol 0.5-2.5 (3) MG/3ML Soln  Commonly known as:  DUONEB  Take 3 mLs by nebulization 4 (four) times daily - after meals and at bedtime.     losartan 50 MG tablet  Commonly known as:  COZAAR  Take 50 mg by mouth daily.     Melatonin 3 MG Tabs  Take 3 mg by mouth at bedtime.     montelukast 10 MG tablet  Commonly known as:  SINGULAIR  take 1 tablet by mouth once daily     MUCINEX MAXIMUM STRENGTH 1200 MG Tb12  Generic drug:  Guaifenesin  Take 1,200 mg by mouth 2 (two) times daily.     nitroGLYCERIN 0.4 MG  SL tablet  Commonly known as:  NITROSTAT  Place 1 tablet (0.4 mg total) under the tongue every 5 (five) minutes x 3 doses as needed for chest pain.     OXYGEN  Inhale 2 L into the lungs continuous.     predniSONE 10 MG tablet  Commonly known as:  DELTASONE  Please take 40 mg oral daily for 1 week, then 30 mg oral daily for 1 week, then 20 mg oral daily thereafter .     senna-docusate 8.6-50 MG tablet  Commonly known as:  Senokot-S  Take 2 tablets by mouth at bedtime.     sodium chloride 0.65 % Soln nasal spray  Commonly known as:  OCEAN  Place 1 spray into both nostrils as needed for congestion.         Physical Exam Filed Vitals:   01/18/15 1414  BP: 142/72  Pulse: 65  Temp: 97 F (36.1 C)  Resp: 18  SpO2: 98%    General- elderly female, chronically ill appearing, in mild distress Head- normocephalic, atraumatic Nose- no nasal discharge Throat- moist mucus membrane  Eyes- no pallor, no icterus, no discharge, normal conjunctiva, normal sclera Neck- no cervical lymphadenopathy, no jugular vein distension Cardiovascular- palpable dorsalis pedis and radial pulses, no leg edema but has trace foot edema Respiratory- bilateral poor air entry, no wheeze, rhonchi or crackles, on o2 and not using her accessory muscles Abdomen- bowel sounds present, soft, non tender Musculoskeletal- able to move all 4 extremities, on wheelchair, generalized weakness  Neurological- no focal deficit, alert and oriented to person, place and time Skin- warm and dry, easy bruising Psychiatry- normal mood and affect    Labs reviewed: Basic Metabolic Panel:  Recent Labs  12/22/14 0240 12/23/14 0310 12/24/14 0410 12/25/14 0254 12/26/14 0335 01/10/15  NA 139 140 141 137 140 144  K 4.1 4.4 4.7 5.2* 4.7 4.1  CL 95* 98* 99* 96* 100*  --   CO2 33* 35* 38* 32 36*  --   GLUCOSE 161* 139* 200* 261* 236*  --   BUN 24* 20 22* 22* 28* 15  CREATININE 0.52 0.50 0.55 0.53 0.51 0.3*  CALCIUM 8.8*  8.7* 8.8* 9.0 8.9  --   MG 2.1 2.2 2.2  --   --   --    Liver Function Tests:  Recent Labs  12/22/14 0240 12/23/14 0310 12/24/14 0410 01/10/15  AST 18 18 20 13   ALT 19 20 23  24  ALKPHOS 48 49 49 74  BILITOT 0.4 0.2* 0.1*  --   PROT 5.4* 5.8* 5.7*  --   ALBUMIN 3.1* 3.0* 3.0*  --    No results for input(s): LIPASE, AMYLASE in the last 8760 hours. No results for input(s): AMMONIA in the last 8760 hours. CBC:  Recent Labs  12/22/14 0240 12/23/14 0310 12/24/14 0410 12/25/14 0254 01/10/15  WBC 17.8* 11.7* 15.5* 10.6* 12.9  NEUTROABS 16.8* 10.9* 14.2*  --   --   HGB 11.7* 11.5* 11.0* 11.2* 8.8*  HCT 38.0 37.0 36.5 36.9 30*  MCV 93.1 93.4 94.1 93.9  --   PLT 332 330 383 358 258   Cardiac Enzymes:  Recent Labs  06/18/14 1740 09/08/14 1930  TROPONINI <0.03 <0.03   BNP: Invalid input(s): POCBNP CBG:  Recent Labs  12/30/14 2229 12/31/14 0750 12/31/14 1152  GLUCAP 267* 162* 203*   Lab Results  Component Value Date   HGBA1C 8.3 11/18/2014     Assessment/Plan  Dyspnea Likely multifactorial. Has COPD, chronic respiratory failure, ongoing anemia and has gained 5 lbs. I also feel there is an anxiety component. Will need a repeat cbc check to assess for need of transfusion. Get BNP and CXR to rule out pulmonary edema and pneumonia. Discontinue plavix. also start small dose lasix as below. Start xanax 0.5 mg qhs and xanax 0.5 tid as needed. Continue o2  Weight gain With dyspnea. No documented hx of chf. Start lasix 20 mg bid x 3 days, then daily and also start kcl supplement. Check bnp and get a cxr to evaluate further  COPD Has chronic copd but no clinical finding suggestive of copd exacerbation.  Anemia Has chronic diseases which could contribute some but given acute drop in Hb concern for gi bleed. Discontinue plavix for now. Continue iron 325 mg bid and pantoprazole 40 mg bid for now. Monitor vital signs    Blanchie Serve, MD  [facility] [phone] (Monday-Friday 8 am - 5 pm) [phone] (afterhours)

## 2015-01-19 ENCOUNTER — Other Ambulatory Visit: Payer: Self-pay | Admitting: *Deleted

## 2015-01-19 LAB — CBC AND DIFFERENTIAL
HEMATOCRIT: 32 % — AB (ref 36–46)
HEMOGLOBIN: 9.2 g/dL — AB (ref 12.0–16.0)
Platelets: 359 10*3/uL (ref 150–399)
WBC: 10 10^3/mL

## 2015-01-19 MED ORDER — ALPRAZOLAM 0.5 MG PO TABS: 0.5000 mg | ORAL_TABLET | Freq: Three times a day (TID) | ORAL | Status: DC | PRN

## 2015-01-24 ENCOUNTER — Non-Acute Institutional Stay (SKILLED_NURSING_FACILITY): Payer: Medicare Other | Admitting: Nurse Practitioner

## 2015-01-24 DIAGNOSIS — J441 Chronic obstructive pulmonary disease with (acute) exacerbation: Secondary | ICD-10-CM

## 2015-01-24 DIAGNOSIS — Z794 Long term (current) use of insulin: Secondary | ICD-10-CM

## 2015-01-24 DIAGNOSIS — J9611 Chronic respiratory failure with hypoxia: Secondary | ICD-10-CM | POA: Diagnosis not present

## 2015-01-24 DIAGNOSIS — E119 Type 2 diabetes mellitus without complications: Secondary | ICD-10-CM | POA: Diagnosis not present

## 2015-01-24 DIAGNOSIS — K5900 Constipation, unspecified: Secondary | ICD-10-CM

## 2015-01-24 DIAGNOSIS — F419 Anxiety disorder, unspecified: Secondary | ICD-10-CM

## 2015-01-24 DIAGNOSIS — D62 Acute posthemorrhagic anemia: Secondary | ICD-10-CM

## 2015-01-24 DIAGNOSIS — D72829 Elevated white blood cell count, unspecified: Secondary | ICD-10-CM

## 2015-01-24 DIAGNOSIS — E43 Unspecified severe protein-calorie malnutrition: Secondary | ICD-10-CM | POA: Diagnosis not present

## 2015-01-24 NOTE — Progress Notes (Signed)
Patient ID: Judith Boyer, female   DOB: 04/08/1945, 69 y.o.   MRN: [id number]    Nursing Home Location:  [place] of Service: SNF (31)  PCP: Judith Reichmann, MD  Allergies  Allergen Reactions  . Doxycycline Anaphylaxis  . Codeine Other (See Comments)    hyperactivity  . Dulera [Mometasone Furo-Formoterol Fum] Hives and Rash  . Adhesive [Tape] Rash  . Latex Other (See Comments)    unknown  . Lisinopril Cough    Chief Complaint  Patient presents with  . Discharge Note    HPI:  Patient is a 69 y.o. female seen today at [facility] for discharge home. She has PMH of HTN, CAD, COPD, DM.Pt at [facility] for  rehabilitation after hospitalization from 12/20/14-12/31/14 with acute respiratory failure from copd exacerbation, requiring IV prednisone and bronchodilators which were weaned to po prednisone which has titration down to 20 mg daily (home dose) and CPAP qhs. Patient currently doing well with therapy, now stable to discharge home with home health.  Review of Systems:  Review of Systems  Constitutional: Negative for activity change, appetite change, fatigue and unexpected weight change.  HENT: Negative for congestion and hearing loss.   Eyes: Negative.   Respiratory: Positive for shortness of breath (at baseline). Negative for cough.   Cardiovascular: Negative for chest pain, palpitations and leg swelling.  Gastrointestinal: Negative for abdominal pain, diarrhea and constipation.  Genitourinary: Negative for dysuria and difficulty urinating.  Musculoskeletal: Negative for myalgias and arthralgias.  Skin: Negative for color change and wound.  Neurological: Negative for dizziness and weakness.  Psychiatric/Behavioral: Negative for behavioral problems, confusion and agitation.    Past Medical History  Diagnosis Date  . COPD (chronic obstructive pulmonary disease) ([place])   . Hypertension   . Coronary artery disease   .  Shortness of breath   . High cholesterol   . Myocardial infarction ([place]) 02/2011  . On home oxygen therapy     "2L; 24h/day" (12/20/2014)  . Pneumonia     "couple times" (02/08/2014)  . Type II diabetes mellitus ([place])     type 2  . Anemia   . History of blood transfusion 2014    "extremely anemic"   Past Surgical History  Procedure Laterality Date  . Shoulder arthroscopy w/ rotator cuff repair Left   . Left heart catheterization with coronary angiogram N/A 02/09/2011    Procedure: LEFT HEART CATHETERIZATION WITH CORONARY ANGIOGRAM;  Surgeon: Judith Riddle, MD;  Location: [place] CATH [facility];  Service: Cardiovascular;  Laterality: N/A;  . Tonsillectomy    . Abdominal hysterectomy  1970's  . Coronary angioplasty with stent placement  12/2007    "2"  . Cardiac catheterization  02/2011   Social History:   reports that she quit smoking about 2 years ago. Her smoking use included Cigarettes. She has a 50 pack-year smoking history. She has never used smokeless tobacco. She reports that she does not drink alcohol or use illicit drugs.  Family History  Problem Relation Age of Onset  . Heart disease Maternal Grandmother   . Brain cancer Brother   . Lung cancer Mother     smoked    Medications: Patient's Medications  New Prescriptions   No medications on file  Previous Medications   ACETAMINOPHEN (TYLENOL) 325 MG TABLET    Take 2 tablets (650 mg total) by mouth every 6 (six) hours as needed for mild pain (or Fever >/=  101).   ALPRAZOLAM (XANAX) 0.5 MG TABLET    Take 1 tablet (0.5 mg total) by mouth 3 (three) times daily as needed for anxiety.   ARFORMOTEROL (BROVANA) 15 MCG/2ML NEBU    Take 2 mLs (15 mcg total) by nebulization 2 (two) times daily.   ATORVASTATIN (LIPITOR) 10 MG TABLET    Take 10 mg by mouth daily.   BISACODYL (DULCOLAX) 10 MG SUPPOSITORY    Place 1 suppository (10 mg total) rectally daily as needed for moderate constipation.   CLOPIDOGREL (PLAVIX) 75 MG TABLET    Take 1 tablet  (75 mg total) by mouth every morning.   DILTIAZEM (CARDIZEM) 90 MG TABLET    Take 1 tablet (90 mg total) by mouth every 8 (eight) hours.   FEEDING SUPPLEMENT, GLUCERNA SHAKE, (GLUCERNA SHAKE) LIQD    Take 237 mLs by mouth 3 (three) times daily between meals.   GUAIFENESIN (MUCINEX MAXIMUM STRENGTH) 1200 MG TB12    Take 1,200 mg by mouth 2 (two) times daily.   INSULIN ASPART (NOVOLOG) 100 UNIT/ML INJECTION    Inject 10 Units into the skin 3 (three) times daily with meals.   INSULIN ASPART (NOVOLOG) 100 UNIT/ML INJECTION    TID AC Inject per sliding scale  <60= hypoglycemic protocol, 60-149=0 units, 150-250=5 units, 251-300=8 units, 301-350=10 units > 351 contact MD   IPRATROPIUM-ALBUTEROL (DUONEB) 0.5-2.5 (3) MG/3ML SOLN    Take 3 mLs by nebulization 4 (four) times daily - after meals and at bedtime.   LOSARTAN (COZAAR) 50 MG TABLET    Take 50 mg by mouth daily.   MELATONIN 3 MG TABS    Take 3 mg by mouth at bedtime.   MONTELUKAST (SINGULAIR) 10 MG TABLET    take 1 tablet by mouth once daily   NITROGLYCERIN (NITROSTAT) 0.4 MG SL TABLET    Place 1 tablet (0.4 mg total) under the tongue every 5 (five) minutes x 3 doses as needed for chest pain.   OXYGEN    Inhale 2 L into the lungs continuous.   PREDNISONE (DELTASONE) 10 MG TABLET    Please take 40 mg oral daily for 1 week, then 30 mg oral daily for 1 week, then 20 mg oral daily thereafter .   SENNA-DOCUSATE (SENOKOT-S) 8.6-50 MG TABLET    Take 2 tablets by mouth at bedtime.   SODIUM CHLORIDE (OCEAN) 0.65 % SOLN NASAL SPRAY    Place 1 spray into both nostrils as needed for congestion.  Modified Medications   No medications on file  Discontinued Medications   No medications on file     Physical Exam: Filed Vitals:   01/24/15 1305  BP: 130/53  Pulse: 91  Temp: 97.3 F (36.3 C)  Resp: 22  Weight: 108 lb (48.988 kg)    Physical Exam  Constitutional: She is oriented to person, place, and time. She appears well-developed and well-nourished.  No distress.  HENT:  Head: Normocephalic and atraumatic.  Mouth/Throat: Oropharynx is clear and moist. No oropharyngeal exudate.  Eyes: Conjunctivae are normal. Pupils are equal, round, and reactive to light.  Neck: Normal range of motion. Neck supple.  Cardiovascular: Normal rate, regular rhythm and normal heart sounds.   Pulmonary/Chest: Effort normal.  Diminished throughout  Abdominal: Soft. Bowel sounds are normal.  Musculoskeletal: She exhibits no edema or tenderness.  Neurological: She is alert and oriented to person, place, and time.  Skin: Skin is warm and dry. She is not diaphoretic.  Psychiatric: She has a normal mood  and affect.    Labs reviewed: Basic Metabolic Panel:  Recent Labs  12/22/14 0240 12/23/14 0310 12/24/14 0410 12/25/14 0254 12/26/14 0335 01/10/15  NA 139 140 141 137 140 144  K 4.1 4.4 4.7 5.2* 4.7 4.1  CL 95* 98* 99* 96* 100*  --   CO2 33* 35* 38* 32 36*  --   GLUCOSE 161* 139* 200* 261* 236*  --   BUN 24* 20 22* 22* 28* 15  CREATININE 0.52 0.50 0.55 0.53 0.51 0.3*  CALCIUM 8.8* 8.7* 8.8* 9.0 8.9  --   MG 2.1 2.2 2.2  --   --   --    Liver Function Tests:  Recent Labs  12/22/14 0240 12/23/14 0310 12/24/14 0410 01/10/15  AST 18 18 20 13   ALT 19 20 23 24   ALKPHOS 48 49 49 74  BILITOT 0.4 0.2* 0.1*  --   PROT 5.4* 5.8* 5.7*  --   ALBUMIN 3.1* 3.0* 3.0*  --    No results for input(s): LIPASE, AMYLASE in the last 8760 hours. No results for input(s): AMMONIA in the last 8760 hours. CBC:  Recent Labs  12/22/14 0240 12/23/14 0310 12/24/14 0410 12/25/14 0254 01/10/15 01/19/15  WBC 17.8* 11.7* 15.5* 10.6* 12.9 10.0  NEUTROABS 16.8* 10.9* 14.2*  --   --   --   HGB 11.7* 11.5* 11.0* 11.2* 8.8* 9.2*  HCT 38.0 37.0 36.5 36.9 30* 32*  MCV 93.1 93.4 94.1 93.9  --   --   PLT 332 330 383 358 258 359   TSH: No results for input(s): TSH in the last 8760 hours. A1C: Lab Results  Component Value Date   HGBA1C 8.3 11/18/2014   Lipid  Panel: No results for input(s): CHOL, HDL, LDLCALC, TRIG, CHOLHDL, LDLDIRECT in the last 8760 hours.  01/20/15 BNP: 26 Radiological Exams: Dg Chest Port 1 View  12/20/2014  CLINICAL DATA:  Shortness of breath and cough for 3 days EXAM: PORTABLE CHEST 1 VIEW COMPARISON:  09/09/2014 FINDINGS: The heart size and mediastinal contours are within normal limits. Both lungs are clear. The visualized skeletal structures are unremarkable. IMPRESSION: No active disease. Electronically Signed   By: Kerby Moors M.D.   On: 12/20/2014 13:21    Assessment/Plan 1. COPD exacerbation (HCC) Exacerbation improved, advanced COPD at baseline, conts on prednisone taper to 20 mg daily. Continue duoneb qid and o2 3l/min. Continue brovana and pulmicort nebuliser. Continue prn albuterol neb for rescue inhaler.   2. Chronic respiratory failure with hypoxia (HCC) Stable, With her copd, continue o2, prednisone and her breathing treatment  3. Acute blood loss anemia With chronic diseases but noted to have acute drop in Hb.  pt was started on iron 325 mg bid and pantoprazole 40 mg bid for now and hgb trending up. Will need ongoing outpatient follow up.   4. Leukocytosis Improved, on chronic prednisone which could have been contributing.   5. Controlled type 2 diabetes mellitus without complication, with long-term current use of insulin (HCC) conts on novolog with meals and SSI  6. Protein-calorie malnutrition, severe ([place]) -conts on supplement  7. Constipation, unspecified constipation type Improved bowel movements, cont current regimen  pt is stable for discharge-will need PT/OT per home health. DME needed includes rollator. Rx written.  will need to follow up with PCP within 2 weeks.     Carlos American. Harle Battiest  [facility] [phone] 8 am - 5 pm) [phone] (after hours)

## 2015-01-27 ENCOUNTER — Telehealth: Payer: Self-pay

## 2015-01-27 DIAGNOSIS — J449 Chronic obstructive pulmonary disease, unspecified: Secondary | ICD-10-CM

## 2015-01-27 DIAGNOSIS — I10 Essential (primary) hypertension: Secondary | ICD-10-CM | POA: Diagnosis not present

## 2015-01-27 DIAGNOSIS — E118 Type 2 diabetes mellitus with unspecified complications: Secondary | ICD-10-CM

## 2015-01-27 DIAGNOSIS — I2510 Atherosclerotic heart disease of native coronary artery without angina pectoris: Secondary | ICD-10-CM

## 2015-01-27 DIAGNOSIS — J9612 Chronic respiratory failure with hypercapnia: Secondary | ICD-10-CM | POA: Diagnosis not present

## 2015-01-27 DIAGNOSIS — R0600 Dyspnea, unspecified: Secondary | ICD-10-CM

## 2015-01-27 DIAGNOSIS — G4734 Idiopathic sleep related nonobstructive alveolar hypoventilation: Secondary | ICD-10-CM

## 2015-01-27 DIAGNOSIS — J9611 Chronic respiratory failure with hypoxia: Secondary | ICD-10-CM | POA: Diagnosis not present

## 2015-01-27 DIAGNOSIS — J441 Chronic obstructive pulmonary disease with (acute) exacerbation: Secondary | ICD-10-CM | POA: Diagnosis not present

## 2015-01-27 DIAGNOSIS — E119 Type 2 diabetes mellitus without complications: Secondary | ICD-10-CM | POA: Diagnosis not present

## 2015-01-27 DIAGNOSIS — E43 Unspecified severe protein-calorie malnutrition: Secondary | ICD-10-CM

## 2015-01-27 DIAGNOSIS — D649 Anemia, unspecified: Secondary | ICD-10-CM | POA: Diagnosis not present

## 2015-01-27 NOTE — Telephone Encounter (Signed)
Please send in whatever orders they need for patient to have her physical therapy.

## 2015-01-27 NOTE — Telephone Encounter (Signed)
Judith Boyer from [place] would like Dr. Everlene Farrier to know-1)  She needs Dr. Everlene Farrier to approve PT 2 times a week for 6 weeks.   2) Pt's BP today was 159/90 and a resting heart rate of 110.  3) She needs a skilled nursing order faxed to [phone] for pt to get a visit at home for medication management. CB # [phone]

## 2015-01-28 NOTE — Telephone Encounter (Signed)
Done

## 2015-01-28 NOTE — Telephone Encounter (Signed)
Can you help answer some of the questions in the order pended? Then I can fax.

## 2015-01-30 ENCOUNTER — Encounter (HOSPITAL_COMMUNITY): Payer: Self-pay | Admitting: Nurse Practitioner

## 2015-01-30 ENCOUNTER — Emergency Department (HOSPITAL_COMMUNITY): Payer: Medicare Other

## 2015-01-30 ENCOUNTER — Inpatient Hospital Stay (HOSPITAL_COMMUNITY)
Admission: EM | Admit: 2015-01-30 | Discharge: 2015-02-05 | DRG: 190 | Disposition: A | Discharge status: Hospice | Payer: Medicare Other | Attending: Internal Medicine | Admitting: Internal Medicine

## 2015-01-30 DIAGNOSIS — D649 Anemia, unspecified: Secondary | ICD-10-CM | POA: Diagnosis not present

## 2015-01-30 DIAGNOSIS — J441 Chronic obstructive pulmonary disease with (acute) exacerbation: Secondary | ICD-10-CM | POA: Diagnosis present

## 2015-01-30 DIAGNOSIS — E118 Type 2 diabetes mellitus with unspecified complications: Secondary | ICD-10-CM | POA: Diagnosis not present

## 2015-01-30 DIAGNOSIS — R05 Cough: Secondary | ICD-10-CM | POA: Diagnosis not present

## 2015-01-30 DIAGNOSIS — E43 Unspecified severe protein-calorie malnutrition: Secondary | ICD-10-CM | POA: Diagnosis present

## 2015-01-30 DIAGNOSIS — J9622 Acute and chronic respiratory failure with hypercapnia: Secondary | ICD-10-CM | POA: Diagnosis present

## 2015-01-30 DIAGNOSIS — Y95 Nosocomial condition: Secondary | ICD-10-CM | POA: Diagnosis present

## 2015-01-30 DIAGNOSIS — E538 Deficiency of other specified B group vitamins: Secondary | ICD-10-CM | POA: Diagnosis present

## 2015-01-30 DIAGNOSIS — Z6821 Body mass index (BMI) 21.0-21.9, adult: Secondary | ICD-10-CM | POA: Diagnosis not present

## 2015-01-30 DIAGNOSIS — J189 Pneumonia, unspecified organism: Secondary | ICD-10-CM | POA: Diagnosis not present

## 2015-01-30 DIAGNOSIS — Z6820 Body mass index (BMI) 20.0-20.9, adult: Secondary | ICD-10-CM

## 2015-01-30 DIAGNOSIS — Z7952 Long term (current) use of systemic steroids: Secondary | ICD-10-CM | POA: Diagnosis not present

## 2015-01-30 DIAGNOSIS — R0602 Shortness of breath: Secondary | ICD-10-CM | POA: Diagnosis not present

## 2015-01-30 DIAGNOSIS — Z794 Long term (current) use of insulin: Secondary | ICD-10-CM | POA: Diagnosis not present

## 2015-01-30 DIAGNOSIS — F419 Anxiety disorder, unspecified: Secondary | ICD-10-CM | POA: Diagnosis not present

## 2015-01-30 DIAGNOSIS — Z66 Do not resuscitate: Secondary | ICD-10-CM | POA: Diagnosis not present

## 2015-01-30 DIAGNOSIS — I252 Old myocardial infarction: Secondary | ICD-10-CM | POA: Diagnosis not present

## 2015-01-30 DIAGNOSIS — Z515 Encounter for palliative care: Secondary | ICD-10-CM | POA: Diagnosis not present

## 2015-01-30 DIAGNOSIS — Z79899 Other long term (current) drug therapy: Secondary | ICD-10-CM

## 2015-01-30 DIAGNOSIS — J9612 Chronic respiratory failure with hypercapnia: Secondary | ICD-10-CM | POA: Diagnosis not present

## 2015-01-30 DIAGNOSIS — J9621 Acute and chronic respiratory failure with hypoxia: Secondary | ICD-10-CM | POA: Diagnosis present

## 2015-01-30 DIAGNOSIS — E119 Type 2 diabetes mellitus without complications: Secondary | ICD-10-CM | POA: Diagnosis present

## 2015-01-30 DIAGNOSIS — Z955 Presence of coronary angioplasty implant and graft: Secondary | ICD-10-CM

## 2015-01-30 DIAGNOSIS — J440 Chronic obstructive pulmonary disease with acute lower respiratory infection: Secondary | ICD-10-CM | POA: Diagnosis not present

## 2015-01-30 DIAGNOSIS — R000 Tachycardia, unspecified: Secondary | ICD-10-CM | POA: Diagnosis not present

## 2015-01-30 DIAGNOSIS — K219 Gastro-esophageal reflux disease without esophagitis: Secondary | ICD-10-CM | POA: Diagnosis present

## 2015-01-30 DIAGNOSIS — Z720 Tobacco use: Secondary | ICD-10-CM | POA: Diagnosis present

## 2015-01-30 DIAGNOSIS — I10 Essential (primary) hypertension: Secondary | ICD-10-CM | POA: Diagnosis present

## 2015-01-30 DIAGNOSIS — E785 Hyperlipidemia, unspecified: Secondary | ICD-10-CM | POA: Diagnosis not present

## 2015-01-30 DIAGNOSIS — Z87891 Personal history of nicotine dependence: Secondary | ICD-10-CM | POA: Diagnosis not present

## 2015-01-30 DIAGNOSIS — I2510 Atherosclerotic heart disease of native coronary artery without angina pectoris: Secondary | ICD-10-CM | POA: Diagnosis not present

## 2015-01-30 DIAGNOSIS — Z9981 Dependence on supplemental oxygen: Secondary | ICD-10-CM | POA: Diagnosis not present

## 2015-01-30 DIAGNOSIS — J9600 Acute respiratory failure, unspecified whether with hypoxia or hypercapnia: Secondary | ICD-10-CM | POA: Insufficient documentation

## 2015-01-30 DIAGNOSIS — J9811 Atelectasis: Secondary | ICD-10-CM | POA: Diagnosis not present

## 2015-01-30 DIAGNOSIS — R0789 Other chest pain: Secondary | ICD-10-CM | POA: Diagnosis not present

## 2015-01-30 LAB — URINALYSIS, ROUTINE W REFLEX MICROSCOPIC
Bilirubin Urine: NEGATIVE
Glucose, UA: >1000 mg/dL — AB
Ketones, ur: 15 mg/dL — AB
LEUKOCYTES UA: NEGATIVE
Nitrite: NEGATIVE
PROTEIN: NEGATIVE mg/dL
Specific Gravity, Urine: 1.028 (ref 1.005–1.030)
pH: 5.5 (ref 5.0–8.0)

## 2015-01-30 LAB — STREP PNEUMONIAE URINARY ANTIGEN: STREP PNEUMO URINARY ANTIGEN: NEGATIVE

## 2015-01-30 LAB — CBC WITH DIFFERENTIAL/PLATELET
BASOS ABS: 0 10*3/uL (ref 0.0–0.1)
BASOS PCT: 0 %
EOS ABS: 0 10*3/uL (ref 0.0–0.7)
Eosinophils Relative: 0 %
HCT: 36 % (ref 36.0–46.0)
HEMOGLOBIN: 10.8 g/dL — AB (ref 12.0–15.0)
LYMPHS PCT: 3 %
Lymphs Abs: 0.8 10*3/uL (ref 0.7–4.0)
MCH: 29.1 pg (ref 26.0–34.0)
MCHC: 30 g/dL (ref 30.0–36.0)
MCV: 97 fL (ref 78.0–100.0)
Monocytes Absolute: 2.2 10*3/uL — ABNORMAL HIGH (ref 0.1–1.0)
Monocytes Relative: 8 %
NEUTROS PCT: 89 %
Neutro Abs: 24.7 10*3/uL — ABNORMAL HIGH (ref 1.7–7.7)
Platelets: 349 10*3/uL (ref 150–400)
RBC: 3.71 MIL/uL — ABNORMAL LOW (ref 3.87–5.11)
RDW: 15.2 % (ref 11.5–15.5)
WBC: 27.7 10*3/uL — ABNORMAL HIGH (ref 4.0–10.5)

## 2015-01-30 LAB — I-STAT VENOUS BLOOD GAS, ED
ACID-BASE EXCESS: 6 mmol/L — AB (ref 0.0–2.0)
Bicarbonate: 34.2 mEq/L — ABNORMAL HIGH (ref 20.0–24.0)
O2 Saturation: 95 %
PCO2 VEN: 62.9 mmHg — AB (ref 45.0–50.0)
PH VEN: 7.343 — AB (ref 7.250–7.300)
PO2 VEN: 81 mmHg — AB (ref 30.0–45.0)
TCO2: 36 mmol/L (ref 0–100)

## 2015-01-30 LAB — BASIC METABOLIC PANEL
Anion gap: 10 (ref 5–15)
BUN: 9 mg/dL (ref 6–20)
CHLORIDE: 96 mmol/L — AB (ref 101–111)
CO2: 30 mmol/L (ref 22–32)
Calcium: 8.6 mg/dL — ABNORMAL LOW (ref 8.9–10.3)
Creatinine, Ser: 0.37 mg/dL — ABNORMAL LOW (ref 0.44–1.00)
GFR calc non Af Amer: >60 mL/min (ref >60)
Glucose, Bld: 298 mg/dL — ABNORMAL HIGH (ref 65–99)
POTASSIUM: 4.1 mmol/L (ref 3.5–5.1)
SODIUM: 136 mmol/L (ref 135–145)

## 2015-01-30 LAB — URINE MICROSCOPIC-ADD ON

## 2015-01-30 LAB — TSH: TSH: 0.685 u[IU]/mL (ref 0.350–4.500)

## 2015-01-30 LAB — INFLUENZA PANEL BY PCR (TYPE A & B)
H1N1FLUPCR: NOT DETECTED
Influenza A By PCR: NEGATIVE
Influenza B By PCR: NEGATIVE

## 2015-01-30 LAB — MAGNESIUM: Magnesium: 1.9 mg/dL (ref 1.7–2.4)

## 2015-01-30 LAB — GLUCOSE, CAPILLARY
GLUCOSE-CAPILLARY: 357 mg/dL — AB (ref 65–99)
GLUCOSE-CAPILLARY: 330 mg/dL — AB (ref 65–99)
GLUCOSE-CAPILLARY: 335 mg/dL — AB (ref 65–99)
Glucose-Capillary: 335 mg/dL — ABNORMAL HIGH (ref 65–99)

## 2015-01-30 LAB — CBG MONITORING, ED: GLUCOSE-CAPILLARY: 263 mg/dL — AB (ref 65–99)

## 2015-01-30 LAB — MRSA PCR SCREENING: MRSA BY PCR: NEGATIVE

## 2015-01-30 MED ORDER — SENNOSIDES-DOCUSATE SODIUM 8.6-50 MG PO TABS
1.0000 | ORAL_TABLET | Freq: Every evening | ORAL | Status: DC | PRN
  Administered 2015-02-03: 1 via ORAL
  Filled 2015-01-30: qty 1

## 2015-01-30 MED ORDER — POTASSIUM CHLORIDE CRYS ER 20 MEQ PO TBCR
10.0000 meq | EXTENDED_RELEASE_TABLET | Freq: Every day | ORAL | Status: DC
  Administered 2015-01-31: 10 meq via ORAL
  Filled 2015-01-30: qty 1

## 2015-01-30 MED ORDER — GLUCERNA SHAKE PO LIQD
237.0000 mL | Freq: Three times a day (TID) | ORAL | Status: DC
  Administered 2015-01-30 – 2015-02-05 (×13): 237 mL via ORAL

## 2015-01-30 MED ORDER — ENOXAPARIN SODIUM 30 MG/0.3ML ~~LOC~~ SOLN
30.0000 mg | SUBCUTANEOUS | Status: DC
  Filled 2015-01-30: qty 0.3

## 2015-01-30 MED ORDER — IPRATROPIUM BROMIDE 0.02 % IN SOLN
0.5000 mg | Freq: Four times a day (QID) | RESPIRATORY_TRACT | Status: DC
  Administered 2015-01-30 – 2015-02-04 (×22): 0.5 mg via RESPIRATORY_TRACT
  Filled 2015-01-30 (×22): qty 2.5

## 2015-01-30 MED ORDER — INSULIN ASPART 100 UNIT/ML ~~LOC~~ SOLN
0.0000 [IU] | Freq: Three times a day (TID) | SUBCUTANEOUS | Status: DC
  Administered 2015-01-30: 15 [IU] via SUBCUTANEOUS
  Administered 2015-01-31: 11 [IU] via SUBCUTANEOUS
  Administered 2015-01-31: 20 [IU] via SUBCUTANEOUS
  Administered 2015-01-31 – 2015-02-01 (×2): 11 [IU] via SUBCUTANEOUS
  Administered 2015-02-01: 3 [IU] via SUBCUTANEOUS
  Administered 2015-02-01: 15 [IU] via SUBCUTANEOUS
  Administered 2015-02-02: 7 [IU] via SUBCUTANEOUS
  Administered 2015-02-02: 3 [IU] via SUBCUTANEOUS
  Administered 2015-02-03: 11 [IU] via SUBCUTANEOUS
  Administered 2015-02-03: 7 [IU] via SUBCUTANEOUS
  Administered 2015-02-03: 4 [IU] via SUBCUTANEOUS
  Administered 2015-02-04 (×2): 7 [IU] via SUBCUTANEOUS

## 2015-01-30 MED ORDER — DILTIAZEM HCL 25 MG/5ML IV SOLN
10.0000 mg | Freq: Once | INTRAVENOUS | Status: AC
  Administered 2015-01-30: 10 mg via INTRAVENOUS
  Filled 2015-01-30: qty 5

## 2015-01-30 MED ORDER — MELATONIN 3 MG PO TABS: 3.0000 mg | ORAL_TABLET | Freq: Every day | ORAL | Status: DC

## 2015-01-30 MED ORDER — IPRATROPIUM BROMIDE 0.02 % IN SOLN: 0.5000 mg | RESPIRATORY_TRACT | Status: DC | PRN

## 2015-01-30 MED ORDER — ONDANSETRON HCL 4 MG/2ML IJ SOLN
4.0000 mg | Freq: Once | INTRAMUSCULAR | Status: AC
  Administered 2015-01-30: 4 mg via INTRAVENOUS
  Filled 2015-01-30: qty 2

## 2015-01-30 MED ORDER — ACETAMINOPHEN 650 MG RE SUPP: 650.0000 mg | Freq: Four times a day (QID) | RECTAL | Status: DC | PRN

## 2015-01-30 MED ORDER — PIPERACILLIN-TAZOBACTAM 3.375 G IVPB
3.3750 g | Freq: Three times a day (TID) | INTRAVENOUS | Status: DC
  Administered 2015-01-30 – 2015-02-03 (×12): 3.375 g via INTRAVENOUS
  Filled 2015-01-30 (×14): qty 50

## 2015-01-30 MED ORDER — ONDANSETRON HCL 4 MG/2ML IJ SOLN
4.0000 mg | Freq: Four times a day (QID) | INTRAMUSCULAR | Status: DC | PRN
  Administered 2015-01-30 – 2015-02-04 (×7): 4 mg via INTRAVENOUS
  Filled 2015-01-30 (×8): qty 2

## 2015-01-30 MED ORDER — ATORVASTATIN CALCIUM 10 MG PO TABS
10.0000 mg | ORAL_TABLET | Freq: Every day | ORAL | Status: DC
  Administered 2015-01-30 – 2015-02-04 (×6): 10 mg via ORAL
  Filled 2015-01-30 (×6): qty 1

## 2015-01-30 MED ORDER — METHYLPREDNISOLONE SODIUM SUCC 125 MG IJ SOLR
80.0000 mg | Freq: Four times a day (QID) | INTRAMUSCULAR | Status: DC
  Administered 2015-01-30: 125 mg via INTRAVENOUS
  Administered 2015-01-30 – 2015-01-31 (×4): 80 mg via INTRAVENOUS
  Filled 2015-01-30 (×5): qty 2

## 2015-01-30 MED ORDER — MONTELUKAST SODIUM 10 MG PO TABS
10.0000 mg | ORAL_TABLET | Freq: Every day | ORAL | Status: DC
  Administered 2015-01-30 – 2015-02-04 (×6): 10 mg via ORAL
  Filled 2015-01-30 (×6): qty 1

## 2015-01-30 MED ORDER — SALINE SPRAY 0.65 % NA SOLN
1.0000 | NASAL | Status: DC | PRN
  Filled 2015-01-30: qty 44

## 2015-01-30 MED ORDER — FUROSEMIDE 20 MG PO TABS
20.0000 mg | ORAL_TABLET | Freq: Every day | ORAL | Status: DC
  Administered 2015-01-31 – 2015-02-05 (×6): 20 mg via ORAL
  Filled 2015-01-30 (×6): qty 1

## 2015-01-30 MED ORDER — OSELTAMIVIR PHOSPHATE 30 MG PO CAPS
30.0000 mg | ORAL_CAPSULE | Freq: Two times a day (BID) | ORAL | Status: DC
  Administered 2015-01-30 (×2): 30 mg via ORAL
  Filled 2015-01-30 (×4): qty 1

## 2015-01-30 MED ORDER — DILTIAZEM HCL 25 MG/5ML IV SOLN: 5.0000 mg | Freq: Once | INTRAVENOUS | Status: DC

## 2015-01-30 MED ORDER — LEVALBUTEROL HCL 0.63 MG/3ML IN NEBU
0.6300 mg | INHALATION_SOLUTION | RESPIRATORY_TRACT | Status: DC | PRN
  Administered 2015-02-02 – 2015-02-03 (×2): 0.63 mg via RESPIRATORY_TRACT
  Filled 2015-01-30 (×2): qty 3

## 2015-01-30 MED ORDER — DM-GUAIFENESIN ER 30-600 MG PO TB12
2.0000 | ORAL_TABLET | Freq: Two times a day (BID) | ORAL | Status: DC
  Administered 2015-01-30 – 2015-02-05 (×13): 2 via ORAL
  Filled 2015-01-30 (×7): qty 2
  Filled 2015-01-30: qty 1
  Filled 2015-01-30 (×4): qty 2
  Filled 2015-01-30: qty 1
  Filled 2015-01-30: qty 2

## 2015-01-30 MED ORDER — SODIUM CHLORIDE 0.9 % IV SOLN
INTRAVENOUS | Status: DC
  Administered 2015-01-30: 14:00:00 via INTRAVENOUS

## 2015-01-30 MED ORDER — ALBUTEROL (5 MG/ML) CONTINUOUS INHALATION SOLN: 2.5000 mg/h | INHALATION_SOLUTION | Freq: Once | RESPIRATORY_TRACT | Status: DC

## 2015-01-30 MED ORDER — SORBITOL 70 % SOLN
30.0000 mL | Freq: Every day | Status: DC | PRN
  Administered 2015-02-01: 30 mL via ORAL
  Filled 2015-01-30 (×3): qty 30

## 2015-01-30 MED ORDER — FLEET ENEMA 7-19 GM/118ML RE ENEM
1.0000 | ENEMA | Freq: Once | RECTAL | Status: DC | PRN
  Filled 2015-01-30: qty 1

## 2015-01-30 MED ORDER — LEVALBUTEROL HCL 0.63 MG/3ML IN NEBU
0.6300 mg | INHALATION_SOLUTION | Freq: Four times a day (QID) | RESPIRATORY_TRACT | Status: DC
  Administered 2015-01-30 – 2015-02-04 (×22): 0.63 mg via RESPIRATORY_TRACT
  Filled 2015-01-30 (×22): qty 3

## 2015-01-30 MED ORDER — ALBUTEROL (5 MG/ML) CONTINUOUS INHALATION SOLN
5.0000 mg/h | INHALATION_SOLUTION | Freq: Once | RESPIRATORY_TRACT | Status: AC
  Administered 2015-01-30: 5 mg/h via RESPIRATORY_TRACT
  Filled 2015-01-30: qty 20

## 2015-01-30 MED ORDER — BUDESONIDE 0.25 MG/2ML IN SUSP
0.2500 mg | Freq: Two times a day (BID) | RESPIRATORY_TRACT | Status: DC
  Administered 2015-01-30 – 2015-01-31 (×2): 0.25 mg via RESPIRATORY_TRACT
  Filled 2015-01-30 (×2): qty 2

## 2015-01-30 MED ORDER — LOSARTAN POTASSIUM 50 MG PO TABS
25.0000 mg | ORAL_TABLET | Freq: Every day | ORAL | Status: DC
  Administered 2015-01-31 – 2015-02-05 (×6): 25 mg via ORAL
  Filled 2015-01-30 (×6): qty 1

## 2015-01-30 MED ORDER — ALPRAZOLAM 0.5 MG PO TABS
0.5000 mg | ORAL_TABLET | Freq: Three times a day (TID) | ORAL | Status: DC | PRN
  Administered 2015-01-30 (×2): 0.5 mg via ORAL
  Filled 2015-01-30 (×2): qty 1

## 2015-01-30 MED ORDER — SENNOSIDES-DOCUSATE SODIUM 8.6-50 MG PO TABS
2.0000 | ORAL_TABLET | Freq: Every day | ORAL | Status: DC
  Administered 2015-01-30 – 2015-02-04 (×6): 2 via ORAL
  Filled 2015-01-30 (×6): qty 2

## 2015-01-30 MED ORDER — DILTIAZEM HCL 60 MG PO TABS
90.0000 mg | ORAL_TABLET | Freq: Three times a day (TID) | ORAL | Status: DC
  Administered 2015-01-30 – 2015-01-31 (×3): 90 mg via ORAL
  Filled 2015-01-30 (×4): qty 1

## 2015-01-30 MED ORDER — VANCOMYCIN HCL IN DEXTROSE 1-5 GM/200ML-% IV SOLN
1000.0000 mg | INTRAVENOUS | Status: DC
  Administered 2015-01-30 – 2015-02-02 (×4): 1000 mg via INTRAVENOUS
  Filled 2015-01-30 (×5): qty 200

## 2015-01-30 MED ORDER — NITROGLYCERIN 0.4 MG SL SUBL: 0.4000 mg | SUBLINGUAL_TABLET | SUBLINGUAL | Status: DC | PRN

## 2015-01-30 MED ORDER — ALUM & MAG HYDROXIDE-SIMETH 200-200-20 MG/5ML PO SUSP
30.0000 mL | Freq: Four times a day (QID) | ORAL | Status: DC | PRN
  Administered 2015-02-03: 30 mL via ORAL
  Filled 2015-01-30: qty 30

## 2015-01-30 MED ORDER — FERROUS SULFATE 325 (65 FE) MG PO TABS
325.0000 mg | ORAL_TABLET | Freq: Two times a day (BID) | ORAL | Status: DC
  Administered 2015-01-30 – 2015-02-05 (×11): 325 mg via ORAL
  Filled 2015-01-30 (×11): qty 1

## 2015-01-30 MED ORDER — DEXTROSE 5 % IV SOLN: 1.0000 g | Freq: Three times a day (TID) | INTRAVENOUS | Status: DC

## 2015-01-30 MED ORDER — ACETAMINOPHEN 325 MG PO TABS: 650.0000 mg | ORAL_TABLET | Freq: Four times a day (QID) | ORAL | Status: DC | PRN

## 2015-01-30 MED ORDER — ONDANSETRON HCL 4 MG PO TABS: 4.0000 mg | ORAL_TABLET | Freq: Four times a day (QID) | ORAL | Status: DC | PRN

## 2015-01-30 MED ORDER — OSELTAMIVIR PHOSPHATE 75 MG PO CAPS: 75.0000 mg | ORAL_CAPSULE | Freq: Two times a day (BID) | ORAL | Status: DC

## 2015-01-30 MED ORDER — PANTOPRAZOLE SODIUM 40 MG PO TBEC
40.0000 mg | DELAYED_RELEASE_TABLET | Freq: Two times a day (BID) | ORAL | Status: DC
  Administered 2015-01-30 – 2015-02-04 (×11): 40 mg via ORAL
  Filled 2015-01-30 (×11): qty 1

## 2015-01-30 MED ORDER — BISACODYL 10 MG RE SUPP: 10.0000 mg | Freq: Every day | RECTAL | Status: DC | PRN

## 2015-01-30 MED ORDER — SODIUM CHLORIDE 0.9 % IJ SOLN
3.0000 mL | Freq: Two times a day (BID) | INTRAMUSCULAR | Status: DC
  Administered 2015-01-30 – 2015-02-05 (×11): 3 mL via INTRAVENOUS

## 2015-01-30 MED ORDER — ARFORMOTEROL TARTRATE 15 MCG/2ML IN NEBU
15.0000 ug | INHALATION_SOLUTION | Freq: Two times a day (BID) | RESPIRATORY_TRACT | Status: DC
  Administered 2015-01-30 – 2015-02-05 (×13): 15 ug via RESPIRATORY_TRACT
  Filled 2015-01-30 (×12): qty 2

## 2015-01-30 NOTE — H&P (Signed)
[facility] History and Physical  JWAN YELL [id number] DOB: Aug 12, 1945 DOA: 01/30/2015  Referring physician: Josephina Gip, PA PCP: Jenny Reichmann, MD   Chief Complaint: Worsening shortness of breath  HPI: Judith Boyer is a 69 y.o. female  With history of COPD on home O2 3 L nasal cannula, coronary artery disease, hypertension, type 2 diabetes, anemia who presents to the ED with a three-day history of worsening shortness of breath which over the past night and on the morning of admission became very terrible per patient. Patient stated that she tried albuterol and Combivent inhalers/nebulizers with no significant improvement and his son and subsequently called EMS and she was brought to the ED. Patient denies any fevers, no chills, no chest pain, no abdominal pain, no diarrhea, no melena, no hematemesis, no hematochezia. Patient does endorse a productive cough of thick yellowish sputum, wheezing, shortness of breath. Patient also endorses some swelling in her right foot. Patient endorses some rattling in her chest. When EMS arrived pick her up she was given some Solu-Medrol and Combivent with no significant improvement. In the ED patient was subsequently placed on the BiPAP. On admission patient was noted to have a heart rate as high as 141 with a respiratory rate as high as 35 and a WBC of 27.7. [facility] were called to admit the patient for further evaluation and management.   Review of Systems: As per history of present illness otherwise negative. Constitutional:  No weight loss, night sweats, Fevers, chills, fatigue.  HEENT:  No headaches, Difficulty swallowing,Tooth/dental problems,Sore throat,  No sneezing, itching, ear ache, nasal congestion, post nasal drip,  Cardio-vascular:  No chest pain, Orthopnea, PND, swelling in lower extremities, anasarca, dizziness, palpitations  GI:  No heartburn, indigestion, abdominal pain, nausea, vomiting, diarrhea,  change in bowel habits, loss of appetite  Resp:  No shortness of breath with exertion or at rest. No excess mucus, no productive cough, No non-productive cough, No coughing up of blood.No change in color of mucus.No wheezing.No chest wall deformity  Skin:  no rash or lesions.  GU:  no dysuria, change in color of urine, no urgency or frequency. No flank pain.  Musculoskeletal:  No joint pain or swelling. No decreased range of motion. No back pain.  Psych:  No change in mood or affect. No depression or anxiety. No memory loss.   Past Medical History  Diagnosis Date  . COPD (chronic obstructive pulmonary disease) ([place])   . Hypertension   . Coronary artery disease   . Shortness of breath   . High cholesterol   . Myocardial infarction ([place]) 02/2011  . On home oxygen therapy     "2L; 24h/day" (12/20/2014)  . Pneumonia     "couple times" (02/08/2014)  . Type II diabetes mellitus ([place])     type 2  . Anemia   . History of blood transfusion 2014    "extremely anemic"   Past Surgical History  Procedure Laterality Date  . Shoulder arthroscopy w/ rotator cuff repair Left   . Left heart catheterization with coronary angiogram N/A 02/09/2011    Procedure: LEFT HEART CATHETERIZATION WITH CORONARY ANGIOGRAM;  Surgeon: Birdie Riddle, MD;  Location: [facility] CATH [facility];  Service: Cardiovascular;  Laterality: N/A;  . Tonsillectomy    . Abdominal hysterectomy  1970's  . Coronary angioplasty with stent placement  12/2007    "2"  . Cardiac catheterization  02/2011   Social History:  reports that she quit smoking about 2 years  ago. Her smoking use included Cigarettes. She has a 50 pack-year smoking history. She has never used smokeless tobacco. She reports that she does not drink alcohol or use illicit drugs.  Allergies  Allergen Reactions  . Doxycycline Anaphylaxis  . Codeine Other (See Comments)    hyperactivity  . Dulera [Mometasone Furo-Formoterol Fum] Hives and Rash  . Adhesive [Tape] Rash  . Latex  Other (See Comments)    unknown  . Lisinopril Cough    Family History  Problem Relation Age of Onset  . Heart disease Maternal Grandmother   . Brain cancer Brother   . Lung cancer Mother     smoked    Mother deceased secondary lung cancer at 20y. Father deceased at 4 secondary to colon cancer. Prior to Admission medications   Medication Sig Start Date End Date Taking? Authorizing Provider  acetaminophen (TYLENOL) 325 MG tablet Take 2 tablets (650 mg total) by mouth every 6 (six) hours as needed for mild pain (or Fever >/= 101). 12/31/14   Silver Huguenin Elgergawy, MD  ALPRAZolam Duanne Moron) 0.5 MG tablet Take 1 tablet (0.5 mg total) by mouth 3 (three) times daily as needed for anxiety. Patient taking differently: Take 0.5 mg by mouth 3 (three) times daily as needed for anxiety. Xanax 0.5 mg qhs routinely for anxiety and TID PRN anxiety 01/19/15   Estill Dooms, MD  arformoterol (BROVANA) 15 MCG/2ML NEBU Take 2 mLs (15 mcg total) by nebulization 2 (two) times daily. 12/31/14   Silver Huguenin Elgergawy, MD  atorvastatin (LIPITOR) 10 MG tablet Take 10 mg by mouth daily.    Historical Provider, MD  bisacodyl (DULCOLAX) 10 MG suppository Place 1 suppository (10 mg total) rectally daily as needed for moderate constipation. 12/31/14   Silver Huguenin Elgergawy, MD  diltiazem (CARDIZEM) 90 MG tablet Take 1 tablet (90 mg total) by mouth every 8 (eight) hours. 12/31/14   Silver Huguenin Elgergawy, MD  feeding supplement, GLUCERNA SHAKE, (GLUCERNA SHAKE) LIQD Take 237 mLs by mouth 3 (three) times daily between meals. 12/31/14   Silver Huguenin Elgergawy, MD  ferrous sulfate 325 (65 FE) MG tablet Take 325 mg by mouth 2 (two) times daily with a meal.    Historical Provider, MD  furosemide (LASIX) 20 MG tablet Take 20 mg by mouth daily.    Historical Provider, MD  Guaifenesin (MUCINEX MAXIMUM STRENGTH) 1200 MG TB12 Take 1,200 mg by mouth 2 (two) times daily.    Historical Provider, MD  insulin aspart (NOVOLOG) 100 UNIT/ML injection Inject  10 Units into the skin 3 (three) times daily with meals. 12/31/14   Silver Huguenin Elgergawy, MD  insulin aspart (NOVOLOG) 100 UNIT/ML injection TID AC Inject per sliding scale  <60= hypoglycemic protocol, 60-149=0 units, 150-250=5 units, 251-300=8 units, 301-350=10 units > 351 contact MD    Historical Provider, MD  ipratropium-albuterol (DUONEB) 0.5-2.5 (3) MG/3ML SOLN Take 3 mLs by nebulization 4 (four) times daily - after meals and at bedtime. 12/31/14   Silver Huguenin Elgergawy, MD  losartan (COZAAR) 50 MG tablet Take 25 mg by mouth daily.     Historical Provider, MD  Melatonin 3 MG TABS Take 3 mg by mouth at bedtime.    Historical Provider, MD  montelukast (SINGULAIR) 10 MG tablet take 1 tablet by mouth once daily Patient taking differently: take 10 mg by mouth once every evening 07/09/14   [name], MD  nitroGLYCERIN (NITROSTAT) 0.4 MG SL tablet Place 1 tablet (0.4 mg total) under the tongue every 5 (  five) minutes x 3 doses as needed for chest pain. 02/07/12   Dixie Dials, MD  OXYGEN Inhale 2 L into the lungs continuous.    Historical Provider, MD  pantoprazole (PROTONIX) 40 MG tablet Take 40 mg by mouth 2 (two) times daily.    Historical Provider, MD  potassium chloride (K-DUR,KLOR-CON) 10 MEQ tablet Take 10 mEq by mouth daily.    Historical Provider, MD  predniSONE (DELTASONE) 10 MG tablet Please take 40 mg oral daily for 1 week, then 30 mg oral daily for 1 week, then 20 mg oral daily thereafter . Patient taking differently: Take 20 mg by mouth daily with breakfast.  12/31/14   Albertine Patricia, MD  senna-docusate (SENOKOT-S) 8.6-50 MG tablet Take 2 tablets by mouth at bedtime. 12/31/14   Silver Huguenin Elgergawy, MD  sodium chloride (OCEAN) 0.65 % SOLN nasal spray Place 1 spray into both nostrils as needed for congestion. 12/31/14   Albertine Patricia, MD   Physical Exam: Filed Vitals:   01/30/15 1115 01/30/15 1215 01/30/15 1224 01/30/15 1230  BP: 161/72  135/62 136/64  Pulse: 136 130 126 127  Temp:       TempSrc:      Resp: 32 24 28 28   Height:      Weight:      SpO2: 97% 99% 98% 99%    Wt Readings from Last 3 Encounters:  01/30/15 48.081 kg (106 lb)  01/24/15 48.988 kg (108 lb)  12/21/14 44.7 kg (98 lb 8.7 oz)    General:   patient sitting bolt upright on gurney on the BiPAP using accessory muscles of respiration.  Eyes: PERRLA, EOMI, normal lids, irises & conjunctiva ENT: grossly normal hearing, lips & tongue.dry mucous membranes. No exudates.  Neck: no LAD, masses or thyromegaly Cardiovascular: tachycardic, no m/r/g. right pedal edema.  Telemetry:  sinus tachycardia.  Respiratory:  poor air movement. Some scattered coarse breath sounds. Respiratory and expiratory wheezing.  Abdomen: soft, ntnd, +BS Skin:  slight petechiae noted on the heel of the right foot. Musculoskeletal: grossly normal tone BUE/BLE Psychiatric: grossly normal mood and affect, speech fluent and appropriate Neurologic:  alert and oriented 3. Cranial nerves II through XII grossly intact. No focal deficits.           Labs on Admission:  Basic Metabolic Panel:  Recent Labs Lab 01/30/15 0918  NA 136  K 4.1  CL 96*  CO2 30  GLUCOSE 298*  BUN 9  CREATININE 0.37*  CALCIUM 8.6*   Liver Function Tests: No results for input(s): AST, ALT, ALKPHOS, BILITOT, PROT, ALBUMIN in the last 168 hours. No results for input(s): LIPASE, AMYLASE in the last 168 hours. No results for input(s): AMMONIA in the last 168 hours. CBC:  Recent Labs Lab 01/30/15 0918  WBC 27.7*  NEUTROABS 24.7*  HGB 10.8*  HCT 36.0  MCV 97.0  PLT 349   Cardiac Enzymes: No results for input(s): CKTOTAL, CKMB, CKMBINDEX, TROPONINI in the last 168 hours.  BNP (last 3 results)  Recent Labs  06/18/14 1740 08/13/14 1320 12/25/14 1210  BNP 12.2 12.0 45.6    ProBNP (last 3 results) No results for input(s): PROBNP in the last 8760 hours.  CBG:  Recent Labs Lab 01/30/15 0929  GLUCAP 263*    Radiological Exams on  Admission: Dg Chest Portable 1 View  01/30/2015  CLINICAL DATA:  Worsening shortness of Breath for couple of days, history of COPD EXAM: PORTABLE CHEST 1 VIEW COMPARISON:  12/25/2014 FINDINGS: Cardiomediastinal  silhouette is stable. Mild hyperinflation. There is patchy left basilar atelectasis or early infiltrate. No pulmonary edema. Atherosclerotic calcifications of thoracic aorta again noted. IMPRESSION: Mild hyperinflation again noted. Patchy left basilar atelectasis or early infiltrate. No pulmonary edema. Electronically Signed   By: [name] M.D.   On: 01/30/2015 10:14    EKG: Independently reviewed. Sinus tachycardia with a heart rate of 139.  Assessment/Plan Principal Problem:   Acute on chronic respiratory failure with hypoxia and hypercapnia (HCC) Active Problems:   Hypertension   Diabetes mellitus type 2, controlled, without complications (HCC)   COPD exacerbation (HCC)   Vitamin B 12 deficiency   Protein-calorie malnutrition, severe (HCC)   B12 deficiency   Coronary artery disease involving native coronary artery of native heart without angina pectoris   Anxiety   Tachycardia   Chronic respiratory failure with hypercapnia (HCC)   Acute exacerbation of chronic obstructive pulmonary disease (COPD) ([place])   Diabetes mellitus with complication (HCC)   Tobacco abuse   HCAP (healthcare-associated pneumonia)   #1 acute on chronic respiratory failure with hypoxia and hypercarbia Likely multifactorial secondary to early healthcare associated pneumonia per chest x-ray in the setting of an acute COPD exacerbation. Patient currently on the BiPAP and using accessory muscles of respiration. Patient does state she is a DO NOT INTUBATE. Will check a sputum Gram stain and culture, urine Legionella antigen, urine pneumococcus antigen, blood cultures 2. Check ABG. Check an influenza PCR. Check a respiratory virus panel. Place patient empirically on IV vancomycin and IV Zosyn. Place on IV  steroid taper, Singulair, Flonase, Tamiflu, scheduled nebulizers, PPI, Brovana, Pulmicort. BiPAP. Follow.  #2 probable early left basilar healthcare associated pneumonia Per chest x-ray. Patient presented acute on chronic respiratory failure with hypoxia and hypercarbia. Check a sputum Gram stain and culture. Check a urine Legionella antigen. Check an influenza PCR. Check a urine pneumococcus antigen. Place empirically on IV vancomycin and IV Zosyn, oxygen, Mucinex, nebulizer treatments, empiric Tamiflu.  #3 acute COPD exacerbation Likely triggered by problem #2. Patient currently on the BiPAP with poor air movement. Place on IV steroid taper, IV antibiotics, scheduled Xopenex and Atrovent nebulizers, Pulmicort and Brovana, Flonase, Claritin, Mucinex. BiPAP. Follow.  #4 diabetes mellitus Check a hemoglobin A1c. Place on sliding scale insulin.  #5 sinus tachycardia Likely secondary to acute illness. EKG with a sinus tachycardia. Patient has not had her Cardizem today. Will give a 10 mg IV Cardizem push 1. Resume oral home dose Cardizem. Follow.  #6 severe protein calorie nutrition Nutritional supplementation.  #7 prophylaxis PPI for GI prophylaxis. Lovenox for DVT prophylaxis.  Code Status: DO NOT INTUBATE./Partial CODE. DVT Prophylaxis: Lovenox. Family Communication: Updated patient. No family present. Disposition Plan: Admit to stepdown unit  Time spent: 32 minutes  Malvern Kadlec M.D. [facility] Pager [phone]

## 2015-01-30 NOTE — Progress Notes (Signed)
01/30/2015 Patient transfer from the emergency room to 2 central at 1315. She is alert, oriented and normally ambulatory.  Skin was clean with CHG bath and then place on telemetry. Patient came to floor on a bipap having labored breathing and very anxious, but saturation was in the mid 90's. Patient was eventually given anxiety med at 1338 and eventually went to sleep. She was also placed on droplet precaution. Patient skin no breakage noted, heels dry. [employer] RN.

## 2015-01-30 NOTE — ED Notes (Signed)
Stevie PA at bedside.

## 2015-01-30 NOTE — ED Notes (Signed)
Dr Cook at bedside

## 2015-01-30 NOTE — Progress Notes (Signed)
Per Dr Lacinda Axon - start patient on [place] first

## 2015-01-30 NOTE — ED Provider Notes (Signed)
CSN: [id number]     Arrival date & time 01/30/15  [id number] History   None    Chief Complaint  Patient presents with  . Shortness of Breath   HPI  Judith Boyer is a 69 year old female with past medical history of COPD, hypertension, CAD and diabetes presenting with increased shortness of breath and cough. She reports increased shortness of breath over the past 3 days. She notes that she feels like she has chest congestion but does not have strength to cough up the phlegm. She has been having a dry cough over the past 3 days as well. She used her home albuterol and nebulizers with mild relief of her symptoms. She states this morning the home treatments provided no relief and she called EMS. In EMS, she received Solu-Medrol and combivent. She states that these did not improve her symptoms. She is on 3 L of oxygen at home at baseline. She also notes increased frequency of urination and feeling that her bladder does not empty when she does urinate. Denies fevers, chills, nasal congestion, rhinorrhea, sore throat, chest pain, abdominal pain, nausea or vomiting. She notes that she does not want to be intubated. She was recently hospitalized on 11/14 for COPD exacerbation. She was discharged to a SNF for rehab.   Past Medical History  Diagnosis Date  . COPD (chronic obstructive pulmonary disease) ([place])   . Hypertension   . Coronary artery disease   . Shortness of breath   . High cholesterol   . Myocardial infarction ([place]) 02/2011  . On home oxygen therapy     "2L; 24h/day" (12/20/2014)  . Pneumonia     "couple times" (02/08/2014)  . Type II diabetes mellitus ([place])     type 2  . Anemia   . History of blood transfusion 2014    "extremely anemic"   Past Surgical History  Procedure Laterality Date  . Shoulder arthroscopy w/ rotator cuff repair Left   . Left heart catheterization with coronary angiogram N/A 02/09/2011    Procedure: LEFT HEART CATHETERIZATION WITH CORONARY ANGIOGRAM;  Surgeon: Birdie Riddle, MD;  Location: [facility];  Service: Cardiovascular;  Laterality: N/A;  . Tonsillectomy    . Abdominal hysterectomy  1970's  . Coronary angioplasty with stent placement  12/2007    "2"  . Cardiac catheterization  02/2011   Family History  Problem Relation Age of Onset  . Heart disease Maternal Grandmother   . Brain cancer Brother   . Lung cancer Mother     smoked   Social History  Substance Use Topics  . Smoking status: Former Smoker -- 1.00 packs/day for 50 years    Types: Cigarettes    Quit date: 01/10/2013  . Smokeless tobacco: Never Used  . Alcohol Use: No   OB History    No data available     Review of Systems  Respiratory: Positive for cough, chest tightness and shortness of breath.   All other systems reviewed and are negative.     Allergies  Doxycycline; Codeine; Dulera; Adhesive; Latex; and Lisinopril  Home Medications   Prior to Admission medications   Medication Sig Start Date End Date Taking? Authorizing Provider  acetaminophen (TYLENOL) 325 MG tablet Take 2 tablets (650 mg total) by mouth every 6 (six) hours as needed for mild pain (or Fever >/= 101). 12/31/14  Yes Albertine Patricia, MD  albuterol (PROAIR HFA) 108 (90 BASE) MCG/ACT inhaler Inhale 2 puffs into the lungs every 4 (four) hours  as needed for wheezing or shortness of breath.   Yes Historical Provider, MD  albuterol (PROVENTIL) (2.5 MG/3ML) 0.083% nebulizer solution Take 2.5 mg by nebulization every 4 (four) hours as needed for wheezing or shortness of breath.  01/26/15  Yes Historical Provider, MD  ALPRAZolam Duanne Moron) 0.25 MG tablet Take 0.125-0.25 mg by mouth 3 (three) times daily as needed for anxiety.   Yes Historical Provider, MD  atorvastatin (LIPITOR) 10 MG tablet Take 10 mg by mouth daily after supper.    Yes Historical Provider, MD  budesonide (PULMICORT) 0.5 MG/2ML nebulizer solution Take 0.5 mg by nebulization every 12 (twelve) hours. 01/26/15  Yes Historical Provider, MD   feeding supplement, GLUCERNA SHAKE, (GLUCERNA SHAKE) LIQD Take 237 mLs by mouth 3 (three) times daily between meals. Patient taking differently: Take 237 mLs by mouth daily.  12/31/14  Yes Albertine Patricia, MD  ferrous sulfate 325 (65 FE) MG tablet Take 325 mg by mouth 2 (two) times daily with a meal.   Yes Historical Provider, MD  furosemide (LASIX) 20 MG tablet Take 20 mg by mouth daily.   Yes Historical Provider, MD  Guaifenesin (MUCINEX MAXIMUM STRENGTH) 1200 MG TB12 Take 1,200 mg by mouth 2 (two) times daily.   Yes Historical Provider, MD  insulin NPH Human (HUMULIN N,NOVOLIN N) 100 UNIT/ML injection Inject 16 Units into the skin daily before breakfast.   Yes Historical Provider, MD  insulin regular (NOVOLIN R,HUMULIN R) 100 units/mL injection Inject 2-10 Units into the skin 3 (three) times daily. Reported on 01/31/2015   Yes Historical Provider, MD  ipratropium-albuterol (DUONEB) 0.5-2.5 (3) MG/3ML SOLN Take 3 mLs by nebulization 4 (four) times daily - after meals and at bedtime. 12/31/14  Yes Albertine Patricia, MD  losartan (COZAAR) 25 MG tablet Take 25 mg by mouth at bedtime. Hold for SBP <110 01/26/15  Yes Historical Provider, MD  Melatonin 3 MG TABS Take 3 mg by mouth at bedtime.   Yes Historical Provider, MD  metFORMIN (GLUCOPHAGE) 500 MG tablet Take 500 mg by mouth 2 (two) times daily with a meal.   Yes Historical Provider, MD  montelukast (SINGULAIR) 10 MG tablet take 1 tablet by mouth once daily Patient taking differently: take 10 mg by mouth daily with supper 07/09/14  Yes [name], MD  OXYGEN Inhale 3 L into the lungs continuous.    Yes Historical Provider, MD  pantoprazole (PROTONIX) 40 MG tablet Take 40 mg by mouth 2 (two) times daily.   Yes Historical Provider, MD  polyethylene glycol (MIRALAX / GLYCOLAX) packet Take 17 g by mouth daily as needed (constipation).   Yes Historical Provider, MD  potassium chloride (K-DUR,KLOR-CON) 10 MEQ tablet Take 10 mEq by mouth daily.    Yes Historical Provider, MD  predniSONE (DELTASONE) 20 MG tablet Take 20 mg by mouth daily. 01/26/15  Yes Historical Provider, MD  senna-docusate (SENOKOT-S) 8.6-50 MG tablet Take 2 tablets by mouth at bedtime. 12/31/14  Yes Silver Huguenin Elgergawy, MD  sodium chloride (OCEAN) 0.65 % SOLN nasal spray Place 1 spray into both nostrils as needed for congestion. Patient taking differently: Place 1 spray into both nostrils daily as needed for congestion.  12/31/14  Yes Albertine Patricia, MD  ALPRAZolam Duanne Moron) 0.5 MG tablet Take 1 tablet (0.5 mg total) by mouth 3 (three) times daily as needed for anxiety. Patient not taking: Reported on 01/31/2015 01/19/15   Estill Dooms, MD  arformoterol [facility]) 15 MCG/2ML NEBU Take 2 mLs (15 mcg  total) by nebulization 2 (two) times daily. Patient not taking: Reported on 01/31/2015 12/31/14   Silver Huguenin Elgergawy, MD  bisacodyl (DULCOLAX) 10 MG suppository Place 1 suppository (10 mg total) rectally daily as needed for moderate constipation. Patient not taking: Reported on 01/31/2015 12/31/14   Silver Huguenin Elgergawy, MD  diltiazem (CARDIZEM) 90 MG tablet Take 1 tablet (90 mg total) by mouth every 8 (eight) hours. Patient taking differently: Take 90 mg by mouth every 8 (eight) hours. Hold is SBP is <110 and HR is <60 12/31/14   Albertine Patricia, MD  insulin aspart (NOVOLOG) 100 UNIT/ML injection Inject 10 Units into the skin 3 (three) times daily with meals. Patient not taking: Reported on 01/31/2015 12/31/14   Silver Huguenin Elgergawy, MD  nitroGLYCERIN (NITROSTAT) 0.4 MG SL tablet Place 1 tablet (0.4 mg total) under the tongue every 5 (five) minutes x 3 doses as needed for chest pain. Patient not taking: Reported on 01/31/2015 02/07/12   Dixie Dials, MD  predniSONE (DELTASONE) 10 MG tablet Please take 40 mg oral daily for 1 week, then 30 mg oral daily for 1 week, then 20 mg oral daily thereafter . Patient not taking: Reported on 01/31/2015 12/31/14   Silver Huguenin Elgergawy, MD    BP 118/60 mmHg  Pulse 92  Temp(Src) 98.4 F (36.9 C) (Oral)  Resp 20  Ht 5\' 1"  (1.549 m)  Wt 50.168 kg  BMI 20.91 kg/m2  SpO2 97% Physical Exam  Constitutional: She appears well-developed and well-nourished. No distress.  HENT:  Head: Normocephalic and atraumatic.  Eyes: Conjunctivae are normal. Right eye exhibits no discharge. Left eye exhibits no discharge. No scleral icterus.  Neck: Normal range of motion.  Cardiovascular: Normal rate and regular rhythm.   Pulmonary/Chest: She is in respiratory distress. She has decreased breath sounds. She has wheezes.  Tachypneic. Patient is using accessory muscles and appears distressed. Decreased breath sounds throughout all lung fields. Rare wheeze heard in upper lung fields  Musculoskeletal: Normal range of motion.  Neurological: She is alert. Coordination normal.  Skin: Skin is warm and dry.  Psychiatric: She has a normal mood and affect. Her behavior is normal.  Nursing note and vitals reviewed.   ED Course  Procedures (including critical care time) Labs Review Labs Reviewed  CBC WITH DIFFERENTIAL/PLATELET - Abnormal; Notable for the following:    WBC 27.7 (*)    RBC 3.71 (*)    Hemoglobin 10.8 (*)    Neutro Abs 24.7 (*)    Monocytes Absolute 2.2 (*)    All other components within normal limits  BASIC METABOLIC PANEL - Abnormal; Notable for the following:    Chloride 96 (*)    Glucose, Bld 298 (*)    Creatinine, Ser 0.37 (*)    Calcium 8.6 (*)    All other components within normal limits  URINALYSIS, ROUTINE W REFLEX MICROSCOPIC (NOT AT [facility]) - Abnormal; Notable for the following:    Glucose, UA >1000 (*)    Hgb urine dipstick SMALL (*)    Ketones, ur 15 (*)    All other components within normal limits  BLOOD GAS, ARTERIAL - Abnormal; Notable for the following:    pH, Arterial 7.311 (*)    pCO2 arterial 63.8 (*)    Bicarbonate 31.3 (*)    Acid-Base Excess 5.4 (*)    All other components within normal limits   HEMOGLOBIN A1C - Abnormal; Notable for the following:    Hgb A1c MFr Bld 7.2 (*)  All other components within normal limits  GLUCOSE, CAPILLARY - Abnormal; Notable for the following:    Glucose-Capillary 357 (*)    All other components within normal limits  GLUCOSE, CAPILLARY - Abnormal; Notable for the following:    Glucose-Capillary 335 (*)    All other components within normal limits  URINE MICROSCOPIC-ADD ON - Abnormal; Notable for the following:    Squamous Epithelial / LPF 0-5 (*)    Bacteria, UA RARE (*)    All other components within normal limits  COMPREHENSIVE METABOLIC PANEL - Abnormal; Notable for the following:    Glucose, Bld 250 (*)    Calcium 8.2 (*)    Total Protein 5.0 (*)    Albumin 2.7 (*)    All other components within normal limits  CBC - Abnormal; Notable for the following:    WBC 22.5 (*)    RBC 2.93 (*)    Hemoglobin 8.7 (*)    HCT 28.3 (*)    All other components within normal limits  GLUCOSE, CAPILLARY - Abnormal; Notable for the following:    Glucose-Capillary 330 (*)    All other components within normal limits  GLUCOSE, CAPILLARY - Abnormal; Notable for the following:    Glucose-Capillary 335 (*)    All other components within normal limits  GLUCOSE, CAPILLARY - Abnormal; Notable for the following:    Glucose-Capillary 215 (*)    All other components within normal limits  BLOOD GAS, ARTERIAL - Abnormal; Notable for the following:    pH, Arterial 7.337 (*)    pCO2 arterial 53.3 (*)    Bicarbonate 27.7 (*)    Acid-Base Excess 2.4 (*)    All other components within normal limits  GLUCOSE, CAPILLARY - Abnormal; Notable for the following:    Glucose-Capillary 268 (*)    All other components within normal limits  GLUCOSE, CAPILLARY - Abnormal; Notable for the following:    Glucose-Capillary 285 (*)    All other components within normal limits  CBC - Abnormal; Notable for the following:    WBC 25.6 (*)    RBC 3.18 (*)    Hemoglobin 9.1 (*)     HCT 31.1 (*)    MCHC 29.3 (*)    All other components within normal limits  BASIC METABOLIC PANEL - Abnormal; Notable for the following:    Potassium 5.4 (*)    Glucose, Bld 260 (*)    BUN 25 (*)    Calcium 8.6 (*)    All other components within normal limits  GLUCOSE, CAPILLARY - Abnormal; Notable for the following:    Glucose-Capillary 364 (*)    All other components within normal limits  GLUCOSE, CAPILLARY - Abnormal; Notable for the following:    Glucose-Capillary 140 (*)    All other components within normal limits  GLUCOSE, CAPILLARY - Abnormal; Notable for the following:    Glucose-Capillary 162 (*)    All other components within normal limits  GLUCOSE, CAPILLARY - Abnormal; Notable for the following:    Glucose-Capillary 249 (*)    All other components within normal limits  CBG MONITORING, ED - Abnormal; Notable for the following:    Glucose-Capillary 263 (*)    All other components within normal limits  I-STAT VENOUS BLOOD GAS, ED - Abnormal; Notable for the following:    pH, Ven 7.343 (*)    pCO2, Ven 62.9 (*)    pO2, Ven 81.0 (*)    Bicarbonate 34.2 (*)    Acid-Base Excess 6.0 (*)  All other components within normal limits  URINE CULTURE  CULTURE, BLOOD (ROUTINE X 2)  CULTURE, BLOOD (ROUTINE X 2)  CULTURE, EXPECTORATED SPUTUM-ASSESSMENT  MRSA PCR SCREENING  RESPIRATORY VIRUS PANEL  HIV ANTIBODY (ROUTINE TESTING)  STREP PNEUMONIAE URINARY ANTIGEN  LEGIONELLA ANTIGEN, URINE  MAGNESIUM  TSH  INFLUENZA PANEL BY PCR (TYPE A & B, H1N1)  MAGNESIUM  BLOOD GAS, VENOUS  POTASSIUM    Imaging Review Dg Chest 2 View  02/01/2015  CLINICAL DATA:  69 year old female with shortness of breath, cough and congestion. EXAM: CHEST  2 VIEW COMPARISON:  02/01/2015 and prior exams FINDINGS: Cardiomegaly identified. Left basilar opacity is unchanged. There is no evidence of pneumothorax, pulmonary edema or pulmonary mass. No acute bony abnormalities are identified.  IMPRESSION: Unchanged appearance of the chest with continued left basilar opacity which may represent atelectasis or airspace disease/ pneumonia. Electronically Signed   By: Margarette Canada M.D.   On: 02/01/2015 11:11   Dg Chest Port 1 View  02/01/2015  CLINICAL DATA:  Shortness of breath. EXAM: PORTABLE CHEST 1 VIEW COMPARISON:  01/30/2015. FINDINGS: Mediastinum hilar structures normal. Cardiomegaly with pulmonary vascular congestion and prominent interstitial changes consistent with congestive heart failure. Bibasilar atelectasis and/or pneumonia noted. Small left pleural effusion cannot be excluded. No pneumothorax. IMPRESSION: 1. Cardiomegaly with mild pulmonary venous congestion interstitial prominence suggesting congestive heart failure. Small left pleural effusion cannot be excluded . 2. Bibasilar atelectasis and/or infiltrates again noted. Electronically Signed   By: Marcello Moores  Register   On: 02/01/2015 07:16   I have personally reviewed and evaluated these images and lab results as part of my medical decision-making.   EKG Interpretation   Date/Time:  Sunday January 30 2015 09:02:33 EST Ventricular Rate:  139 PR Interval:  122 QRS Duration: 79 QT Interval:  303 QTC Calculation: 461 R Axis:   80 Text Interpretation:  Sinus tachycardia Atrial premature complex Probable  left atrial enlargement Minimal ST depression, inferior leads ED PHYSICIAN  INTERPRETATION AVAILABLE IN [facility] Confirmed by TEST, Record  ([id number]) on 01/31/2015 7:06:06 AM      MDM   Final diagnoses:  COPD exacerbation ([place])  HCAP (healthcare-associated pneumonia)   69 year old female with PMHx of COPD presenting in respiratory distress. Reports home treatments were unable to improve her symptoms. She called EMS and received solu-medrol and combivent with mild improvement of symptoms. Pt is tachycardic and tachypneic. She appears to be in respiratory distress. Patient placed on bipap in ED. Lung sounds are  diminished with few wheezes throughout lung fields. CXR shows possible early infiltrate in left lobe. Pt is SNF resident. Consulted hospitalist for admission for COPD exacerbation with HCAP. Patient is to be admitted to stepdown.     Josephina Gip, PA-C 02/01/15 1205  Nat Christen, MD 02/02/15 [phone]

## 2015-01-30 NOTE — Progress Notes (Signed)
ANTIBIOTIC CONSULT NOTE - INITIAL  Pharmacy Consult for Vancomycin and Zosyn Indication: pneumonia  Allergies  Allergen Reactions  . Doxycycline Anaphylaxis  . Codeine Other (See Comments)    hyperactivity  . Dulera [Mometasone Furo-Formoterol Fum] Hives and Rash  . Adhesive [Tape] Rash  . Latex Other (See Comments)    unknown  . Lisinopril Cough    Patient Measurements: Height: 5\' 1"  (154.9 cm) Weight: 106 lb (48.081 kg) IBW/kg (Calculated) : 47.8  Vital Signs: Temp: 99.2 F (37.3 C) (12/25 1030) Temp Source: Axillary (12/25 1030) BP: 135/64 mmHg (12/25 1030) Pulse Rate: 135 (12/25 1030)  Labs:  Recent Labs  01/30/15 0918  WBC 27.7*  HGB 10.8*  PLT 349  CREATININE 0.37*   Estimated Creatinine Clearance: 49.4 mL/min (by C-G formula based on Cr of 0.37). No results for input(s): VANCOTROUGH, VANCOPEAK, VANCORANDOM, GENTTROUGH, GENTPEAK, GENTRANDOM, TOBRATROUGH, TOBRAPEAK, TOBRARND, AMIKACINPEAK, AMIKACINTROU, AMIKACIN in the last 72 hours.   Microbiology: No results found for this or any previous visit (from the past 720 hour(s)).  Medical History: Past Medical History  Diagnosis Date  . COPD (chronic obstructive pulmonary disease) ([place])   . Hypertension   . Coronary artery disease   . Shortness of breath   . High cholesterol   . Myocardial infarction ([place]) 02/2011  . On home oxygen therapy     "2L; 24h/day" (12/20/2014)  . Pneumonia     "couple times" (02/08/2014)  . Type II diabetes mellitus ([place])     type 2  . Anemia   . History of blood transfusion 2014    "extremely anemic"    Assessment: 69 year old female admitted with SOB / COPD exacerbation found to have pneumonia.  Pharmacy asked to begin Vancomycin and Zosyn for HCAP  Goal of Therapy:  Vancomycin trough level 15-20 mcg/ml  Appropriate Zosyn dosing  Plan:  Zosyn 3.375 grams iv  Q 8 hours - 4 hr infusion Vancomycin 1 gram iv Q 24 hours Follow up LOT, Scr, cultures, fever trend  Thank  you Anette Guarneri, PharmD [phone]  01/30/2015,10:50 AM

## 2015-01-30 NOTE — Progress Notes (Signed)
Pt feels like she does not breathe as well with the neb treatment attached to the [place]. The treatment was stopped and the MD and PA were notified.

## 2015-01-30 NOTE — ED Notes (Signed)
Pt. Endorses nausea. Verbal order for 4mg  of IV zofran given.

## 2015-01-30 NOTE — ED Notes (Signed)
Per EMS pt from home called out for COPD exacerbation. She took nebtc of albuerol at 2:30 this morning, and a combvent treatment around 6 am. Patient lungs diminished throughout. Patient is on 3L O2 at home. EMS gave 125mg  of IV solumderol, combivent tx (5mg  of albuterol, 0.5mg  atrovent).   Patient able to speak few words at a time on nebulizer, Respirations 30's.

## 2015-01-30 NOTE — Progress Notes (Signed)
CRITICAL VALUE ALERT 2 Critical value received:  PH (7.31), C02(63.8), 02 (82.2), Bicarb (31.3)  Date of notification:  01/29/2014  Time of notification:  [id number]  Critical value read back:Yes.    Nurse who received alert: Rico Sheehan RN  MD notified (1st page):  Dr Grandville Silos   Time of first page:  47  MD notified (2nd page):  Time of second page:  Responding MD:  Dr Grandville Silos   Time MD responded:  1410

## 2015-01-31 DIAGNOSIS — J9621 Acute and chronic respiratory failure with hypoxia: Secondary | ICD-10-CM

## 2015-01-31 DIAGNOSIS — F419 Anxiety disorder, unspecified: Secondary | ICD-10-CM

## 2015-01-31 DIAGNOSIS — J9622 Acute and chronic respiratory failure with hypercapnia: Secondary | ICD-10-CM

## 2015-01-31 DIAGNOSIS — J441 Chronic obstructive pulmonary disease with (acute) exacerbation: Secondary | ICD-10-CM

## 2015-01-31 LAB — GLUCOSE, CAPILLARY
GLUCOSE-CAPILLARY: 215 mg/dL — AB (ref 65–99)
GLUCOSE-CAPILLARY: 268 mg/dL — AB (ref 65–99)
Glucose-Capillary: 140 mg/dL — ABNORMAL HIGH (ref 65–99)
Glucose-Capillary: 162 mg/dL — ABNORMAL HIGH (ref 65–99)
Glucose-Capillary: 285 mg/dL — ABNORMAL HIGH (ref 65–99)
Glucose-Capillary: 364 mg/dL — ABNORMAL HIGH (ref 65–99)

## 2015-01-31 LAB — BLOOD GAS, ARTERIAL
ACID-BASE EXCESS: 5.4 mmol/L — AB (ref 0.0–2.0)
Acid-Base Excess: 2.4 mmol/L — ABNORMAL HIGH (ref 0.0–2.0)
BICARBONATE: 27.7 meq/L — AB (ref 20.0–24.0)
Bicarbonate: 31.3 mEq/L — ABNORMAL HIGH (ref 20.0–24.0)
DELIVERY SYSTEMS: POSITIVE
DRAWN BY: 331761
Drawn by: 276051
EXPIRATORY PAP: 5
FIO2: 0.6
INSPIRATORY PAP: 14
O2 CONTENT: 4 L/min
O2 Saturation: 94.6 %
O2 Saturation: 95.5 %
PCO2 ART: 53.3 mmHg — AB (ref 35.0–45.0)
PH ART: 7.337 — AB (ref 7.350–7.450)
PO2 ART: 83 mmHg (ref 80.0–100.0)
Patient temperature: 98.6
Patient temperature: 98.6
RATE: 16 resp/min
TCO2: 33.3 mmol/L (ref 0–100)
TCO2: 29.4 mmol/L (ref 0–100)
pCO2 arterial: 63.8 mmHg (ref 35.0–45.0)
pH, Arterial: 7.311 — ABNORMAL LOW (ref 7.350–7.450)
pO2, Arterial: 82.2 mmHg (ref 80.0–100.0)

## 2015-01-31 LAB — EXPECTORATED SPUTUM ASSESSMENT W REFEX TO RESP CULTURE

## 2015-01-31 LAB — CBC
HCT: 28.3 % — ABNORMAL LOW (ref 36.0–46.0)
HEMOGLOBIN: 8.7 g/dL — AB (ref 12.0–15.0)
MCH: 29.7 pg (ref 26.0–34.0)
MCHC: 30.7 g/dL (ref 30.0–36.0)
MCV: 96.6 fL (ref 78.0–100.0)
PLATELETS: 253 10*3/uL (ref 150–400)
RBC: 2.93 MIL/uL — AB (ref 3.87–5.11)
RDW: 15 % (ref 11.5–15.5)
WBC: 22.5 10*3/uL — ABNORMAL HIGH (ref 4.0–10.5)

## 2015-01-31 LAB — COMPREHENSIVE METABOLIC PANEL
ALK PHOS: 57 U/L (ref 38–126)
ALT: 21 U/L (ref 14–54)
ANION GAP: 8 (ref 5–15)
AST: 19 U/L (ref 15–41)
Albumin: 2.7 g/dL — ABNORMAL LOW (ref 3.5–5.0)
BILIRUBIN TOTAL: 0.6 mg/dL (ref 0.3–1.2)
BUN: 18 mg/dL (ref 6–20)
CALCIUM: 8.2 mg/dL — AB (ref 8.9–10.3)
CO2: 27 mmol/L (ref 22–32)
CREATININE: 0.52 mg/dL (ref 0.44–1.00)
Chloride: 104 mmol/L (ref 101–111)
GFR calc non Af Amer: >60 mL/min (ref >60)
GLUCOSE: 250 mg/dL — AB (ref 65–99)
Potassium: 4.3 mmol/L (ref 3.5–5.1)
Sodium: 139 mmol/L (ref 135–145)
TOTAL PROTEIN: 5 g/dL — AB (ref 6.5–8.1)

## 2015-01-31 LAB — URINE CULTURE: Culture: NO GROWTH

## 2015-01-31 MED ORDER — DILTIAZEM HCL 60 MG PO TABS
60.0000 mg | ORAL_TABLET | Freq: Three times a day (TID) | ORAL | Status: DC
  Administered 2015-01-31 – 2015-02-05 (×15): 60 mg via ORAL
  Filled 2015-01-31 (×15): qty 1

## 2015-01-31 MED ORDER — SODIUM CHLORIDE 0.9 % IV SOLN
INTRAVENOUS | Status: DC
  Administered 2015-01-31 – 2015-02-01 (×2): via INTRAVENOUS

## 2015-01-31 MED ORDER — INSULIN GLARGINE 100 UNIT/ML ~~LOC~~ SOLN
20.0000 [IU] | Freq: Every day | SUBCUTANEOUS | Status: DC
  Administered 2015-01-31 – 2015-02-05 (×6): 20 [IU] via SUBCUTANEOUS
  Filled 2015-01-31 (×6): qty 0.2

## 2015-01-31 MED ORDER — MORPHINE SULFATE (CONCENTRATE) 10 MG/0.5ML PO SOLN
2.5000 mg | Freq: Once | ORAL | Status: AC
  Administered 2015-01-31: 2.6 mg via ORAL
  Filled 2015-01-31: qty 0.5

## 2015-01-31 MED ORDER — BUDESONIDE 0.5 MG/2ML IN SUSP
0.5000 mg | Freq: Two times a day (BID) | RESPIRATORY_TRACT | Status: DC
  Administered 2015-01-31 – 2015-02-05 (×10): 0.5 mg via RESPIRATORY_TRACT
  Filled 2015-01-31 (×10): qty 2

## 2015-01-31 MED ORDER — ALPRAZOLAM 0.25 MG PO TABS
0.2500 mg | ORAL_TABLET | Freq: Three times a day (TID) | ORAL | Status: DC | PRN
  Administered 2015-01-31 – 2015-02-05 (×9): 0.25 mg via ORAL
  Filled 2015-01-31 (×10): qty 1

## 2015-01-31 MED ORDER — METHYLPREDNISOLONE SODIUM SUCC 40 MG IJ SOLR
40.0000 mg | Freq: Two times a day (BID) | INTRAMUSCULAR | Status: DC
  Administered 2015-01-31 – 2015-02-03 (×5): 40 mg via INTRAVENOUS
  Filled 2015-01-31 (×5): qty 1

## 2015-01-31 MED ORDER — ENOXAPARIN SODIUM 40 MG/0.4ML ~~LOC~~ SOLN
40.0000 mg | SUBCUTANEOUS | Status: DC
  Administered 2015-02-04: 40 mg via SUBCUTANEOUS
  Filled 2015-01-31 (×5): qty 0.4

## 2015-01-31 NOTE — Telephone Encounter (Signed)
Judith Boyer sent to order to [facility].

## 2015-01-31 NOTE — Progress Notes (Signed)
Utilization Review Completed.Judith Boyer T12/26/2016  

## 2015-01-31 NOTE — Consult Note (Signed)
Name: Judith Boyer MRN: [id number] DOB: January 15, 1946    ADMISSION DATE:  01/30/2015 CONSULTATION DATE:  01/31/15  REFERRING MD :  Dr. Candiss Norse   CHIEF COMPLAINT:  SOB  BRIEF PATIENT DESCRIPTION: 69 y/o F with PMH of severe O2 dependent COPD admitted 12/25 with complaints of worsening shortness of breath.  ABG consistent with mild hypercarbic respiratory failure.  Pt placed on BiPAP.  PCCM consulted for evaluation 12/26.    SIGNIFICANT EVENTS  12/25  Admit with increasing SOB, hypercarbic resp fx. Rx'd with bipap.  ? Atelectasis vs infiltrate left basilar  STUDIES:     HISTORY OF PRESENT ILLNESS:  69 y/o F, former smoker, with PMH of severe O2 dependent COPD, HTN, CAD s/p MI, HLD, DM II and anemia admitted 12/25 with complaints of worsening shortness of breath.    The patient reports she was recently discharged from a SNF for rehab on 12/21.  She indicates once she got home she had difficulty walking up her two front steps and she became very short of breath with the act of getting into the house.  She was able to rehab back to walking approximately 50-60 ft with assistance.  SNF notes reflect she also had concern for GIB with hgb drop from 11 to 8.  Plavix was held and BP medications reduced (cardizem and losartan).  She called EMS the day of admit when breathing treatments did not resolve shortness of breath.  She was treated with IV steroids & nebulized bronchodilators per EMS.  She denied fevers, chills, nausea / vomiting, diarrhea, chest pain, pain with inspiration.  ABG on admission notable for mild hypercarbic respiratory failure and she was placed on bipap.  TRH admitted for further evaluation. The patient was treated empirically for AECOPD and possible HCAP with left basilar atx on CXR.  PCCM consulted for evaluation 12/26.    The patient reports she wore bipap overnight and feels better am of 12/26 but not back to baseline.  She currently denies flu-like symptoms, fevers, chills,  N/V/D, chest pain.      PAST MEDICAL HISTORY :   has a past medical history of COPD (chronic obstructive pulmonary disease) ([place]); Hypertension; Coronary artery disease; Shortness of breath; High cholesterol; Myocardial infarction [facility]) (02/2011); On home oxygen therapy; Pneumonia; Type II diabetes mellitus ([place]); Anemia; and History of blood transfusion (2014).  has past surgical history that includes Shoulder arthroscopy w/ rotator cuff repair (Left); left heart catheterization with coronary angiogram (N/A, 02/09/2011); Tonsillectomy; Abdominal hysterectomy (1970's); Coronary angioplasty with stent (12/2007); and Cardiac catheterization (02/2011).    Prior to Admission medications   Medication Sig Start Date End Date Taking? Authorizing Provider  acetaminophen (TYLENOL) 325 MG tablet Take 2 tablets (650 mg total) by mouth every 6 (six) hours as needed for mild pain (or Fever >/= 101). 12/31/14   Silver Huguenin Elgergawy, MD  ALPRAZolam Duanne Moron) 0.5 MG tablet Take 1 tablet (0.5 mg total) by mouth 3 (three) times daily as needed for anxiety. Patient taking differently: Take 0.5 mg by mouth 3 (three) times daily as needed for anxiety. Xanax 0.5 mg qhs routinely for anxiety and TID PRN anxiety 01/19/15   Estill Dooms, MD  arformoterol (BROVANA) 15 MCG/2ML NEBU Take 2 mLs (15 mcg total) by nebulization 2 (two) times daily. 12/31/14   Silver Huguenin Elgergawy, MD  atorvastatin (LIPITOR) 10 MG tablet Take 10 mg by mouth daily.    Historical Provider, MD  bisacodyl (DULCOLAX) 10 MG suppository Place 1 suppository (10  mg total) rectally daily as needed for moderate constipation. 12/31/14   Silver Huguenin Elgergawy, MD  diltiazem (CARDIZEM) 90 MG tablet Take 1 tablet (90 mg total) by mouth every 8 (eight) hours. 12/31/14   Silver Huguenin Elgergawy, MD  feeding supplement, GLUCERNA SHAKE, (GLUCERNA SHAKE) LIQD Take 237 mLs by mouth 3 (three) times daily between meals. 12/31/14   Silver Huguenin Elgergawy, MD  ferrous sulfate 325 (65 FE) MG  tablet Take 325 mg by mouth 2 (two) times daily with a meal.    Historical Provider, MD  furosemide (LASIX) 20 MG tablet Take 20 mg by mouth daily.    Historical Provider, MD  Guaifenesin (MUCINEX MAXIMUM STRENGTH) 1200 MG TB12 Take 1,200 mg by mouth 2 (two) times daily.    Historical Provider, MD  insulin aspart (NOVOLOG) 100 UNIT/ML injection Inject 10 Units into the skin 3 (three) times daily with meals. 12/31/14   Silver Huguenin Elgergawy, MD  insulin aspart (NOVOLOG) 100 UNIT/ML injection TID AC Inject per sliding scale  <60= hypoglycemic protocol, 60-149=0 units, 150-250=5 units, 251-300=8 units, 301-350=10 units > 351 contact MD    Historical Provider, MD  ipratropium-albuterol (DUONEB) 0.5-2.5 (3) MG/3ML SOLN Take 3 mLs by nebulization 4 (four) times daily - after meals and at bedtime. 12/31/14   Silver Huguenin Elgergawy, MD  losartan (COZAAR) 50 MG tablet Take 25 mg by mouth daily.     Historical Provider, MD  Melatonin 3 MG TABS Take 3 mg by mouth at bedtime.    Historical Provider, MD  montelukast (SINGULAIR) 10 MG tablet take 1 tablet by mouth once daily Patient taking differently: take 10 mg by mouth once every evening 07/09/14   [name], MD  nitroGLYCERIN (NITROSTAT) 0.4 MG SL tablet Place 1 tablet (0.4 mg total) under the tongue every 5 (five) minutes x 3 doses as needed for chest pain. 02/07/12   Dixie Dials, MD  OXYGEN Inhale 2 L into the lungs continuous.    Historical Provider, MD  pantoprazole (PROTONIX) 40 MG tablet Take 40 mg by mouth 2 (two) times daily.    Historical Provider, MD  potassium chloride (K-DUR,KLOR-CON) 10 MEQ tablet Take 10 mEq by mouth daily.    Historical Provider, MD  predniSONE (DELTASONE) 10 MG tablet Please take 40 mg oral daily for 1 week, then 30 mg oral daily for 1 week, then 20 mg oral daily thereafter . Patient taking differently: Take 20 mg by mouth daily with breakfast.  12/31/14   Albertine Patricia, MD  senna-docusate (SENOKOT-S) 8.6-50 MG tablet Take 2  tablets by mouth at bedtime. 12/31/14   Silver Huguenin Elgergawy, MD  sodium chloride (OCEAN) 0.65 % SOLN nasal spray Place 1 spray into both nostrils as needed for congestion. 12/31/14   Albertine Patricia, MD   Allergies  Allergen Reactions  . Doxycycline Anaphylaxis  . Codeine Other (See Comments)    hyperactivity  . Dulera [Mometasone Furo-Formoterol Fum] Hives and Rash  . Adhesive [Tape] Rash  . Latex Other (See Comments)    unknown  . Lisinopril Cough    FAMILY HISTORY:  family history includes Brain cancer in her brother; Heart disease in her maternal grandmother; Lung cancer in her mother.   SOCIAL HISTORY:  reports that she quit smoking about 2 years ago. Her smoking use included Cigarettes. She has a 50 pack-year smoking history. She has never used smokeless tobacco. She reports that she does not drink alcohol or use illicit drugs.  REVIEW OF SYSTEMS:  Constitutional: Negative for fever, chills, weight loss, malaise/fatigue and diaphoresis.  HENT: Negative for hearing loss, ear pain, nosebleeds, congestion, sore throat, neck pain, tinnitus and ear discharge.   Eyes: Negative for blurred vision, double vision, photophobia, pain, discharge and redness.  Respiratory: Negative for hemoptysis, wheezing and stridor.  Positive for cough with yellow sputum production and worsening shortness of breath Cardiovascular: Negative for chest pain, palpitations, orthopnea, claudication, leg swelling and PND.  Gastrointestinal: Negative for heartburn, nausea, vomiting, abdominal pain, diarrhea, constipation, blood in stool and melena.  Genitourinary: Negative for dysuria, urgency, frequency, hematuria and flank pain.  Musculoskeletal: Negative for myalgias, back pain, joint pain and falls.  Skin: Negative for itching and rash.  Neurological: Negative for dizziness, tingling, tremors, sensory change, speech change, focal weakness, seizures, loss of consciousness, weakness and headaches.    Endo/Heme/Allergies: Negative for environmental allergies and polydipsia. Does not bruise/bleed easily.  SUBJECTIVE:   VITAL SIGNS: Temp:  [97.6 F (36.4 C)-98.9 F (37.2 C)] 97.7 F (36.5 C) (12/26 0834) Pulse Rate:  [64-130] 89 (12/26 0834) Resp:  [16-35] 26 (12/26 0834) BP: (93-144)/(46-104) 122/56 mmHg (12/26 0800) SpO2:  [94 %-100 %] 100 % (12/26 0834) FiO2 (%):  [40 %-50 %] 40 % (12/26 0208) Weight:  [110 lb 3.2 oz (49.986 kg)] 110 lb 3.2 oz (49.986 kg) (12/26 0456)  PHYSICAL EXAMINATION: General:  Chronically ill appearing female in NAD, up to chair  Neuro:  AAOx4, anxious, speech clear, MAE  HEENT:  MM pink/moist, no jvd  Cardiovascular:  s1s2 rrr, no m/r/g Lungs:  Prolonged exp phase, lungs bilaterally diminished throughout, faint wheeze  Abdomen:  NTND, bsx4 active  Musculoskeletal:  No acute deformities  Skin:  Warm/dry, no peripheral edema    Recent Labs Lab 01/30/15 0918 01/31/15 0245  NA 136 139  K 4.1 4.3  CL 96* 104  CO2 30 27  BUN 9 18  CREATININE 0.37* 0.52  GLUCOSE 298* 250*    Recent Labs Lab 01/30/15 0918 01/31/15 0245  HGB 10.8* 8.7*  HCT 36.0 28.3*  WBC 27.7* 22.5*  PLT 349 253   Dg Chest Portable 1 View  01/30/2015  CLINICAL DATA:  Worsening shortness of Breath for couple of days, history of COPD EXAM: PORTABLE CHEST 1 VIEW COMPARISON:  12/25/2014 FINDINGS: Cardiomediastinal silhouette is stable. Mild hyperinflation. There is patchy left basilar atelectasis or early infiltrate. No pulmonary edema. Atherosclerotic calcifications of thoracic aorta again noted. IMPRESSION: Mild hyperinflation again noted. Patchy left basilar atelectasis or early infiltrate. No pulmonary edema. Electronically Signed   By: [name] M.D.   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  Pt is very concerned about oversedation.  She wants to be alert / interactive as possible but would like relief from dyspnea.      Anxiety   Plan: Adjust xanax to home dosing  of 0.25 TID PRN   Noe Gens, NP-C [facility] Pgr: [phone] or if no answer [phone] 01/31/2015, 12:24 PM   Attending:  I have seen and examined the patient with nurse practitioner/resident and agree with the note above.  My edits are in BOLD above.  We formulated the plan together and I elicited the following history.    Chart reviewed including palliative medicine notes from last month's hospital visit. She has severe COPD, came in with another exacerbation. Feels better after receiving morphine today, now off BIPAP  On exam Very little air movement, increased effort but speaking in full sentences CV: RRR, no mgr  End stage COPD, in exacerbation> appears to be slowly improving but my suspicion from reading the chart is that she will likely have some degree of dyspnea even while resting for the rest of her life.  We introduced morphine today and she was able to come off of BIPAP and reports feeling well.  Will adjust dose to q4h prn dyspnea, explained appropriate use to her today.  Will re-introduce conversation about palliative care tomorrow Continue prn BIPAP  Roselie Awkward, MD [facility] Pager: [phone] Cell: [phone] After 3pm or if no response, call [phone]

## 2015-01-31 NOTE — Evaluation (Signed)
Physical Therapy Evaluation Patient Details Name: Judith Boyer MRN: [id number] DOB: [birth year] Today's Date: 01/31/2015   History of Present Illness  69 yo female admitted with COPD exacerbation. Pt currently with PNA. PMH: HTN, COPD on 3 L at home, DM, CAD Anemia  Clinical Impression  Pt admitted with above diagnosis. Pt currently with functional limitations due to the deficits listed below (see PT Problem List). Pt was able to ambulate with RW with min assist for balance.  Given that pt lives alone,may benefit from SNF prior to d/c home.  Respiratory status and anxiety limit pts function.  Will follow acutely.   Pt will benefit from skilled PT to increase their independence and safety with mobility to allow discharge to the venue listed below.      Follow Up Recommendations SNF;Supervision/Assistance - 24 hour    Equipment Recommendations  None recommended by PT    Recommendations for Other Services       Precautions / Restrictions Precautions Precautions: Fall;Other (comment) (oxygen) Restrictions Weight Bearing Restrictions: No      Mobility  Bed Mobility Overal bed mobility: Modified Independent             General bed mobility comments: using rails and HOB elevated  Transfers Overall transfer level: Needs assistance Equipment used: Rolling walker (2 wheeled) Transfers: Sit to/from Stand Sit to Stand: Min assist         General transfer comment: needed assist to power up and steadying assist once up.    Ambulation/Gait Ambulation/Gait assistance: Min assist Ambulation Distance (Feet): 35 Feet (20 feet then 15 feet) Assistive device: Rolling walker (2 wheeled) Gait Pattern/deviations: Step-through pattern;Decreased step length - right;Decreased step length - left;Decreased stride length;Drifts right/left;Trunk flexed   Gait velocity interpretation: Below normal speed for age/gender General Gait Details: Pt was able to ambulate 20 feet to door and  then was heading to recliner however too SOB to make it so rested on bed.  then pt ambulated from bed to recliner on other side of bed.  Pt needed cues for pursed lip breathing and steading assist throughout.  Pt needs min assist when she begins to fatigue with incr cues and steadying assist for postural stability as she gets anxious when she fatigues too decreasing her safety awareness.    Stairs            Wheelchair Mobility    Modified Rankin (Stroke Patients Only)       Balance Overall balance assessment: Needs assistance;History of Falls Sitting-balance support: No upper extremity supported;Feet supported Sitting balance-Leahy Scale: Fair     Standing balance support: Bilateral upper extremity supported;During functional activity Standing balance-Leahy Scale: Poor Standing balance comment: relies on UEs for balance.                             Pertinent Vitals/Pain Pain Assessment: No/denies pain  On 4LO2 on arrival with sats 90-93%.  Had to incr pt to 6LO2to keep sats 86-89% for ambulation.  Decr O2 back to 4LO2 once back in chair with sats 93-96%    Home Living Family/patient expects to be discharged to:: Private residence Living Arrangements: Alone;Other (Comment) Available Help at Discharge: Family;Available PRN/intermittently Type of Home: House Home Access: Stairs to enter Entrance Stairs-Rails: Right Entrance Stairs-Number of Steps: 2 Home Layout: One level Home Equipment: Walker - 2 wheels;Shower seat, 3LO2 at home Additional Comments: son works and lives next door    Prior Function  Level of Independence: Independent         Comments: pt recent d/[facility] to Jan 26 2015. Pt d/c home due to no more insurance days for coverage. pt is home alone now and reports difficulty with entering the house.     Hand Dominance   Dominant Hand: Right    Extremity/Trunk Assessment   Upper Extremity Assessment: Defer to OT  evaluation           Lower Extremity Assessment: Generalized weakness      Cervical / Trunk Assessment: Normal  Communication   Communication: No difficulties;Other (comment) (pauses due to breathing)  Cognition Arousal/Alertness: Awake/alert Behavior During Therapy: WFL for tasks assessed/performed Overall Cognitive Status: Within Functional Limits for tasks assessed                      General Comments      Exercises General Exercises - Lower Extremity Ankle Circles/Pumps: AROM;Both;10 reps;Seated Long Arc Quad: AROM;Both;10 reps;Seated      Assessment/Plan    PT Assessment Patient needs continued PT services  PT Diagnosis Generalized weakness   PT Problem List Decreased activity tolerance;Decreased balance;Decreased mobility;Decreased knowledge of use of DME;Decreased safety awareness;Decreased knowledge of precautions  PT Treatment Interventions DME instruction;Gait training;Functional mobility training;Therapeutic activities;Therapeutic exercise;Balance training;Patient/family education   PT Goals (Current goals can be found in the Care Plan section) Acute Rehab PT Goals Patient Stated Goal: none stated this AM PT Goal Formulation: With patient Time For Goal Achievement: 02/14/15 Potential to Achieve Goals: Good    Frequency Min 3X/week   Barriers to discharge Decreased caregiver support      Co-evaluation               End of Session Equipment Utilized During Treatment: Gait belt;Oxygen Activity Tolerance: Patient limited by fatigue Patient left: in chair;with call bell/phone within reach;with chair alarm set Nurse Communication: Mobility status         Time: 1000-1040 PT Time Calculation (min) (ACUTE ONLY): 40 min   Charges:   PT Evaluation $Initial PT Evaluation Tier I: 1 Procedure PT Treatments $Gait Training: 23-37 mins   PT G CodesDenice Paradise 2015-02-27, 3:27 PM Tyrice Hewitt [facility] Acute  Rehabilitation [phone] (pager)

## 2015-01-31 NOTE — Plan of Care (Signed)
Problem: Phase I Progression Outcomes Goal: Pain controlled Outcome: Progressing Discussed pain and pain tolerance levels

## 2015-01-31 NOTE — Progress Notes (Addendum)
Patient Demographics:    Judith Boyer, is a 69 y.o. female, DOB - [birth year], [id number]  Admit date - 01/30/2015   Admitting Physician Eugenie Filler, MD  Outpatient Primary MD for the patient is DAUB, [name], MD  LOS - 1   Chief Complaint  Patient presents with  . Shortness of Breath        Subjective:    Judith Boyer today has, No headache, No chest pain, No abdominal pain - No Nausea, No new weakness tingling or numbness, continues to have cough with shortness of breath.   Assessment  & Plan :     1. Acute on chronic hypoxic and hypercapnic respiratory failure due to COPD exacerbation in the setting of HCAP. She has advanced COPD, currently on empiric IV antibiotics with HCAP along with IV steroids, on nebulizer treatments, based on ABG and respiratory effort continue on BiPAP for a few more hours then 02 as needed, will make her clear liquids only. Discussed with the patient she does not want to get intubated and I told her that her prognosis appears poor. Her pulmonary group has been requested to see her as well.   2. Severe protein calorie malnutrition. On nutritional supplements.   3. Dyslipidemia. On statin.   4. GERD. PPI continued.   5. Essential hypertension. On ARB continue for now.   6. History of CAD. On statin, as pain-free, EKG with nonspecific changes. Supportive care.   7. DM type II. Continue sliding scale for now, add low-dose Lantus for better control while she is on steroids  Lab Results  Component Value Date   HGBA1C 8.3 11/18/2014    CBG (last 3)   Recent Labs  01/30/15 2014 01/31/15 0024 01/31/15 0836  GLUCAP 335* 215* 268*      Code Status : No intubation prognosis is guarded  Family Communication  : None  present  Disposition Plan  : TBD  Consults  :  PCCM  Procedures  :    DVT Prophylaxis  :  Lovenox   Lab Results  Component Value Date   PLT 253 01/31/2015    Inpatient Medications  Scheduled Meds: . arformoterol  15 mcg Nebulization BID  . atorvastatin  10 mg Oral q1800  . budesonide (PULMICORT) nebulizer solution  0.25 mg Nebulization BID  . dextromethorphan-guaiFENesin  2 tablet Oral BID  . diltiazem  90 mg Oral 3 times per day  . enoxaparin (LOVENOX) injection  40 mg Subcutaneous Q24H  . feeding supplement (GLUCERNA SHAKE)  237 mL Oral TID BM  . ferrous sulfate  325 mg Oral BID WC  . furosemide  20 mg Oral Daily  . insulin aspart  0-20 Units Subcutaneous TID WC  . ipratropium  0.5 mg Nebulization Q6H  . levalbuterol  0.63 mg Nebulization Q6H  . losartan  25 mg Oral Daily  . methylPREDNISolone (SOLU-MEDROL) injection  80 mg Intravenous Q6H  . montelukast  10 mg Oral QHS  . oseltamivir  30 mg Oral BID  . pantoprazole  40 mg Oral BID  . piperacillin-tazobactam (ZOSYN)  IV  3.375 g Intravenous Q8H  . potassium chloride  10 mEq Oral Daily  . senna-docusate  2 tablet Oral QHS  . sodium chloride  3 mL Intravenous Q12H  . vancomycin  1,000 mg Intravenous Q24H   Continuous Infusions: . sodium chloride     PRN Meds:.acetaminophen **OR** [DISCONTINUED] acetaminophen, ALPRAZolam, alum & mag hydroxide-simeth, bisacodyl, ipratropium, levalbuterol, nitroGLYCERIN, [DISCONTINUED] ondansetron **OR** ondansetron (ZOFRAN) IV, senna-docusate, sodium chloride, sodium phosphate, sorbitol  Antibiotics  :     Anti-infectives    Start     Dose/Rate Route Frequency Ordered Stop   01/30/15 1430  oseltamivir (TAMIFLU) capsule 30 mg     30 mg Oral 2 times daily 01/30/15 1326 02/04/15 0959   01/30/15 1400  ceFEPIme (MAXIPIME) 1 g in dextrose 5 % 50 mL IVPB  Status:  Discontinued     1 g 100 mL/hr over 30 Minutes Intravenous 3 times per day 01/30/15 1211 01/30/15 1211   01/30/15 1330   oseltamivir (TAMIFLU) capsule 75 mg  Status:  Discontinued     75 mg Oral 2 times daily 01/30/15 1321 01/30/15 1325   01/30/15 1200  piperacillin-tazobactam (ZOSYN) IVPB 3.375 g     3.375 g 12.5 mL/hr over 240 Minutes Intravenous Every 8 hours 01/30/15 1054     01/30/15 1200  vancomycin (VANCOCIN) IVPB 1000 mg/200 mL premix     1,000 mg 200 mL/hr over 60 Minutes Intravenous Every 24 hours 01/30/15 1054          Objective:   Filed Vitals:   01/31/15 0656 01/31/15 0800 01/31/15 0819 01/31/15 0834  BP: 129/85 122/56    Pulse:    89  Temp:    97.7 F (36.5 C)  TempSrc:    Axillary  Resp:    26  Height:      Weight:      SpO2:   98% 100%    Wt Readings from Last 3 Encounters:  01/31/15 49.986 kg (110 lb 3.2 oz)  01/24/15 48.988 kg (108 lb)  12/21/14 44.7 kg (98 lb 8.7 oz)     Intake/Output Summary (Last 24 hours) at 01/31/15 1008 Last data filed at 01/31/15 0700  Gross per 24 hour  Intake   2345 ml  Output   1150 ml  Net   1195 ml     Physical Exam  Awake Alert, Oriented X 3, No new F.N deficits, Normal affect Deport.AT,PERRAL Supple Neck,No JVD, No cervical lymphadenopathy appriciated.  Symmetrical Chest wall movement, minimal air movement bilaterally, exp wheezes RRR,No Gallops,Rubs or new Murmurs, No Parasternal Heave +ve B.Sounds, Abd Soft, No tenderness, No organomegaly appriciated, No rebound - guarding or rigidity. No Cyanosis, Clubbing or edema, No new Rash or bruise       Data Review:   Micro Results Recent Results (from the past 240 hour(s))  MRSA PCR Screening     Status: None   Collection Time: 01/30/15  5:55 PM  Result Value Ref Range Status   MRSA by PCR NEGATIVE NEGATIVE Final    Comment:        The GeneXpert MRSA Assay (FDA approved for NASAL specimens only), is one component of a comprehensive MRSA colonization surveillance program. It is not intended to diagnose MRSA infection nor to guide or monitor treatment for MRSA infections.    Culture, sputum-assessment     Status: None   Collection Time: 01/31/15  7:33 AM  Result Value Ref Range Status   Specimen Description SPUTUM  Final   Special Requests NONE  Final   Sputum evaluation   Final    MICROSCOPIC FINDINGS SUGGEST THAT THIS SPECIMEN IS NOT REPRESENTATIVE OF LOWER RESPIRATORY SECRETIONS.  PLEASE RECOLLECT. Gram Stain Report Called to,Read Back By and Verified With: Tonita Cong AT [id number] 01/31/15 BY L BENFIELD    Report Status 01/31/2015 FINAL  Final    Radiology Reports Dg Chest Portable 1 View  01/30/2015  CLINICAL DATA:  Worsening shortness of Breath for couple of days, history of COPD EXAM: PORTABLE CHEST 1 VIEW COMPARISON:  12/25/2014 FINDINGS: Cardiomediastinal silhouette is stable. Mild hyperinflation. There is patchy left basilar atelectasis or early infiltrate. No pulmonary edema. Atherosclerotic calcifications of thoracic aorta again noted. IMPRESSION: Mild hyperinflation again noted. Patchy left basilar atelectasis or early infiltrate. No pulmonary edema. Electronically Signed   By: Judith Boyer M.D.   On: 01/30/2015 10:14     CBC  Recent Labs Lab 01/30/15 0918 01/31/15 0245  WBC 27.7* 22.5*  HGB 10.8* 8.7*  HCT 36.0 28.3*  PLT 349 253  MCV 97.0 96.6  MCH 29.1 29.7  MCHC 30.0 30.7  RDW 15.2 15.0  LYMPHSABS 0.8  --   MONOABS 2.2*  --   EOSABS 0.0  --   BASOSABS 0.0  --     Chemistries   Recent Labs Lab 01/30/15 0918 01/30/15 1600 01/31/15 0245  NA 136  --  139  K 4.1  --  4.3  CL 96*  --  104  CO2 30  --  27  GLUCOSE 298*  --  250*  BUN 9  --  18  CREATININE 0.37*  --  0.52  CALCIUM 8.6*  --  8.2*  MG  --  1.9  --   AST  --   --  19  ALT  --   --  21  ALKPHOS  --   --  57  BILITOT  --   --  0.6   ------------------------------------------------------------------------------------------------------------------ estimated creatinine clearance is 49.4 mL/min (by C-G formula based on Cr of  0.52). ------------------------------------------------------------------------------------------------------------------ No results for input(s): HGBA1C in the last 72 hours. ------------------------------------------------------------------------------------------------------------------ No results for input(s): CHOL, HDL, LDLCALC, TRIG, CHOLHDL, LDLDIRECT in the last 72 hours. ------------------------------------------------------------------------------------------------------------------  Recent Labs  01/30/15 1600  TSH 0.685   ------------------------------------------------------------------------------------------------------------------ No results for input(s): VITAMINB12, FOLATE, FERRITIN, TIBC, IRON, RETICCTPCT in the last 72 hours.  Coagulation profile No results for input(s): INR, PROTIME in the last 168 hours.  No results for input(s): DDIMER in the last 72 hours.  Cardiac Enzymes No results for input(s): CKMB, TROPONINI, MYOGLOBIN in the last 168 hours.  Invalid input(s): CK ------------------------------------------------------------------------------------------------------------------ Invalid input(s): POCBNP   Time Spent in minutes   35   SINGH,PRASHANT K M.D on 01/31/2015 at 10:08 AM  Between 7am to 7pm - Pager - [phone]  After 7pm go to [email or web address] - password [facility]  [facility] -  Office  [phone]

## 2015-01-31 NOTE — Evaluation (Signed)
Occupational Therapy Evaluation Patient Details Name: Judith Boyer MRN: [id number] DOB: 03-Feb-1946 Today's Date: 01/31/2015    History of Present Illness 69 yo female admitted with COPD exacerbation. Pt currently with PNA. PMH: HTN, COPD on 3 L at home, DM, CAD Anemia   Clinical Impression   PT admitted with COPD exacerbation with PNA. Pt currently with functional limitiations due to the deficits listed below (see OT problem list). PTA recent d/c home on 01/26/15 alone and pt self reports doing poorly. Pt reports "i could barely get into the house when I got home." Pt will benefit from skilled OT to increase their independence and safety with adls and balance to allow discharge SNF. Pt very anxious on evaluation and required incr time to complete task.     Follow Up Recommendations  SNF    Equipment Recommendations  Other (comment) (defer)    Recommendations for Other Services       Precautions / Restrictions Precautions Precautions: Fall;Other (comment) (oxygen)      Mobility Bed Mobility Overal bed mobility: Modified Independent             General bed mobility comments: using rails and HOB elevated  Transfers                 General transfer comment: declined at this time    Balance                                            ADL Overall ADL's : Needs assistance/impaired Eating/Feeding: Set up;Sitting Eating/Feeding Details (indicate cue type and reason): opening all containers for patient. Pt states "i am just so nervous this morning" Grooming: Oral care;Wash/dry face;Wash/dry hands;Set up;Sitting Grooming Details (indicate cue type and reason): sitting eob to do denture care                   Toilet Transfer Details (indicate cue type and reason): declined oob this morning           General ADL Comments: Pt agreeable to bed level and EOB care this am. pt s/p breathing treatment and medications this AM but only  agreeable to EOB> pt static sitting for oral care and breakfast. Pt able to gather all idems from her personal bag but requires (A) opening items. Pt completes denture care with incr time.      Vision     Perception     Praxis      Pertinent Vitals/Pain Pain Assessment: No/denies pain     Hand Dominance Right   Extremity/Trunk Assessment Upper Extremity Assessment Upper Extremity Assessment: Generalized weakness   Lower Extremity Assessment Lower Extremity Assessment: Generalized weakness   Cervical / Trunk Assessment Cervical / Trunk Assessment: Normal   Communication Communication Communication: No difficulties;Other (comment) (pauses due to breathing)   Cognition Arousal/Alertness: Awake/alert Behavior During Therapy: WFL for tasks assessed/performed Overall Cognitive Status: Within Functional Limits for tasks assessed                     General Comments       Exercises       Shoulder Instructions      Home Living Family/patient expects to be discharged to:: Private residence Living Arrangements: Alone;Other (Comment) Available Help at Discharge: Family;Available PRN/intermittently Type of Home: House Home Access: Stairs to enter [employer] of Steps: 2 Entrance Stairs-Rails:  Right Home Layout: One level     Bathroom Shower/Tub: Tub/shower unit Shower/tub characteristics: Curtain Biochemist, clinical: Standard     Home Equipment: Environmental consultant - 2 wheels;Shower seat   Additional Comments: son works and lives next door      Prior Functioning/Environment Level of Independence: Independent        Comments: pt recent d/c to [facility] from november to Jan 26 2015. Pt d/c home due to no more insurance days for coverage. pt is home alone now and reports difficulty with entering the house.    OT Diagnosis: Generalized weakness   OT Problem List: Decreased strength;Decreased activity tolerance;Impaired balance (sitting and/or  standing);Decreased safety awareness;Decreased knowledge of use of DME or AE;Decreased knowledge of precautions;Cardiopulmonary status limiting activity   OT Treatment/Interventions: Self-care/ADL training;Therapeutic exercise;DME and/or AE instruction;Energy conservation;Therapeutic activities;Patient/family education;Balance training    OT Goals(Current goals can be found in the care plan section) Acute Rehab OT Goals Patient Stated Goal: none stated this AM OT Goal Formulation: With patient Time For Goal Achievement: 02/14/15 Potential to Achieve Goals: Good  OT Frequency: Min 2X/week   Barriers to D/C: Decreased caregiver support  no one to help during the day       Co-evaluation              End of Session Nurse Communication: Mobility status;Precautions  Activity Tolerance: Patient tolerated treatment well (very anxious) Patient left: in bed;with call bell/phone within reach   Time: 0835-0905 OT Time Calculation (min): 30 min Charges:  OT General Charges $OT Visit: 1 Procedure OT Evaluation $Initial OT Evaluation Tier I: 1 Procedure OT Treatments $Self Care/Home Management : 8-22 mins G-Codes:    Parke Poisson B Feb 17, 2015, 9:29 AM   Jeri Modena   OTR/L PagerIP:[phone] Office: [phone] .

## 2015-02-01 ENCOUNTER — Inpatient Hospital Stay (HOSPITAL_COMMUNITY): Payer: Medicare Other

## 2015-02-01 ENCOUNTER — Telehealth: Payer: Self-pay

## 2015-02-01 DIAGNOSIS — J9612 Chronic respiratory failure with hypercapnia: Secondary | ICD-10-CM

## 2015-02-01 LAB — CBC
HEMATOCRIT: 31.1 % — AB (ref 36.0–46.0)
HEMOGLOBIN: 9.1 g/dL — AB (ref 12.0–15.0)
MCH: 28.6 pg (ref 26.0–34.0)
MCHC: 29.3 g/dL — AB (ref 30.0–36.0)
MCV: 97.8 fL (ref 78.0–100.0)
Platelets: 293 10*3/uL (ref 150–400)
RBC: 3.18 MIL/uL — AB (ref 3.87–5.11)
RDW: 15 % (ref 11.5–15.5)
WBC: 25.6 10*3/uL — ABNORMAL HIGH (ref 4.0–10.5)

## 2015-02-01 LAB — POTASSIUM: Potassium: 4.4 mmol/L (ref 3.5–5.1)

## 2015-02-01 LAB — BASIC METABOLIC PANEL
Anion gap: 6 (ref 5–15)
BUN: 25 mg/dL — AB (ref 6–20)
CHLORIDE: 101 mmol/L (ref 101–111)
CO2: 31 mmol/L (ref 22–32)
CREATININE: 0.55 mg/dL (ref 0.44–1.00)
Calcium: 8.6 mg/dL — ABNORMAL LOW (ref 8.9–10.3)
GFR calc Af Amer: >60 mL/min (ref >60)
GFR calc non Af Amer: >60 mL/min (ref >60)
GLUCOSE: 260 mg/dL — AB (ref 65–99)
POTASSIUM: 5.4 mmol/L — AB (ref 3.5–5.1)
Sodium: 138 mmol/L (ref 135–145)

## 2015-02-01 LAB — LEGIONELLA ANTIGEN, URINE

## 2015-02-01 LAB — HEMOGLOBIN A1C
HEMOGLOBIN A1C: 7.2 % — AB (ref 4.8–5.6)
Mean Plasma Glucose: 160 mg/dL

## 2015-02-01 LAB — GLUCOSE, CAPILLARY
GLUCOSE-CAPILLARY: 299 mg/dL — AB (ref 65–99)
GLUCOSE-CAPILLARY: 96 mg/dL (ref 65–99)
Glucose-Capillary: 249 mg/dL — ABNORMAL HIGH (ref 65–99)
Glucose-Capillary: 324 mg/dL — ABNORMAL HIGH (ref 65–99)

## 2015-02-01 LAB — MAGNESIUM: Magnesium: 2.1 mg/dL (ref 1.7–2.4)

## 2015-02-01 LAB — HIV ANTIBODY (ROUTINE TESTING W REFLEX): HIV Screen 4th Generation wRfx: NONREACTIVE

## 2015-02-01 MED ORDER — HYDROCOD POLST-CPM POLST ER 10-8 MG/5ML PO SUER
5.0000 mL | Freq: Once | ORAL | Status: AC
Start: 2015-02-01 — End: 2015-02-01
  Administered 2015-02-01: 5 mL via ORAL
  Filled 2015-02-01: qty 5

## 2015-02-01 MED ORDER — CETYLPYRIDINIUM CHLORIDE 0.05 % MT LIQD
7.0000 mL | Freq: Two times a day (BID) | OROMUCOSAL | Status: DC
  Administered 2015-02-01 – 2015-02-05 (×6): 7 mL via OROMUCOSAL

## 2015-02-01 MED ORDER — MORPHINE SULFATE (CONCENTRATE) 10 MG/0.5ML PO SOLN
2.5000 mg | ORAL | Status: DC | PRN
  Administered 2015-02-01 – 2015-02-02 (×2): 2.6 mg via ORAL
  Filled 2015-02-01 (×2): qty 0.5

## 2015-02-01 MED ORDER — GUAIFENESIN-CODEINE 100-10 MG/5ML PO SOLN: 10.0000 mL | ORAL | Status: DC | PRN

## 2015-02-01 MED ORDER — SODIUM POLYSTYRENE SULFONATE 15 GM/60ML PO SUSP
30.0000 g | Freq: Once | ORAL | Status: AC
  Administered 2015-02-01: 30 g via ORAL
  Filled 2015-02-01: qty 120

## 2015-02-01 NOTE — Progress Notes (Signed)
Patient Demographics:    Judith Boyer, is a 69 y.o. female, DOB - Dec 05, 1945, [id number]  Admit date - 01/30/2015   Admitting Physician Eugenie Filler, MD  Outpatient Primary MD for the patient is DAUB, [name], MD  LOS - 2   Chief Complaint  Patient presents with  . Shortness of Breath        Subjective:    Judith Boyer today has, No headache, No chest pain, No abdominal pain - No Nausea, No new weakness tingling or numbness, continues to have cough with shortness of breath.   Assessment  & Plan :     1. Acute on chronic hypoxic and hypercapnic respiratory failure due to COPD exacerbation in the setting of HCAP. She has advanced COPD, currently on empiric IV antibiotics with HCAP along with IV steroids, on nebulizer treatments, needed BiPAP initially. Discussed with the patient she does not want to get intubated and I told her that her prognosis appears poor. Her pulmonary group has been requested to see her as well along with Pall Care to discuss goals of care.   2. Severe protein calorie malnutrition. On nutritional supplements.   3. Dyslipidemia. On statin.   4. GERD. PPI continued.   5. Essential hypertension. On ARB continue for now.   6. History of CAD. On statin, as pain-free, EKG with nonspecific changes. Supportive care.   7. DM type II. Continue sliding scale for now, add low-dose Lantus for better control while she is on steroids  Lab Results  Component Value Date   HGBA1C 7.2* 01/30/2015    CBG (last 3)   Recent Labs  01/31/15 2005 01/31/15 2348 02/01/15 0726  GLUCAP 140* 162* 249*      Code Status : No intubation prognosis is guarded  Family Communication  : None present  Disposition Plan  : TBD  Consults  :  PCCM  Procedures  :     DVT Prophylaxis  :  Lovenox   Lab Results  Component Value Date   PLT 293 02/01/2015    Inpatient Medications  Scheduled Meds: . arformoterol  15 mcg Nebulization BID  . atorvastatin  10 mg Oral q1800  . budesonide (PULMICORT) nebulizer solution  0.5 mg Nebulization BID  . dextromethorphan-guaiFENesin  2 tablet Oral BID  . diltiazem  60 mg Oral 3 times per day  . enoxaparin (LOVENOX) injection  40 mg Subcutaneous Q24H  . feeding supplement (GLUCERNA SHAKE)  237 mL Oral TID BM  . ferrous sulfate  325 mg Oral BID WC  . furosemide  20 mg Oral Daily  . insulin aspart  0-20 Units Subcutaneous TID WC  . insulin glargine  20 Units Subcutaneous Daily  . ipratropium  0.5 mg Nebulization Q6H  . levalbuterol  0.63 mg Nebulization Q6H  . losartan  25 mg Oral Daily  . methylPREDNISolone (SOLU-MEDROL) injection  40 mg Intravenous Q12H  . montelukast  10 mg Oral QHS  . pantoprazole  40 mg Oral BID  . piperacillin-tazobactam (ZOSYN)  IV  3.375 g Intravenous Q8H  . senna-docusate  2 tablet Oral QHS  . sodium chloride  3 mL Intravenous Q12H  . sodium polystyrene  30 g Oral Once  . vancomycin  1,000 mg Intravenous  Q24H   Continuous Infusions:   PRN Meds:.acetaminophen **OR** [DISCONTINUED] acetaminophen, ALPRAZolam, alum & mag hydroxide-simeth, bisacodyl, guaiFENesin-codeine, ipratropium, levalbuterol, nitroGLYCERIN, [DISCONTINUED] ondansetron **OR** ondansetron (ZOFRAN) IV, senna-docusate, sodium chloride, sodium phosphate, sorbitol  Antibiotics  :     Anti-infectives    Start     Dose/Rate Route Frequency Ordered Stop   01/30/15 1430  oseltamivir (TAMIFLU) capsule 30 mg  Status:  Discontinued     30 mg Oral 2 times daily 01/30/15 1326 01/31/15 1013   01/30/15 1400  ceFEPIme (MAXIPIME) 1 g in dextrose 5 % 50 mL IVPB  Status:  Discontinued     1 g 100 mL/hr over 30 Minutes Intravenous 3 times per day 01/30/15 1211 01/30/15 1211   01/30/15 1330  oseltamivir (TAMIFLU) capsule 75 mg   Status:  Discontinued     75 mg Oral 2 times daily 01/30/15 1321 01/30/15 1325   01/30/15 1200  piperacillin-tazobactam (ZOSYN) IVPB 3.375 g     3.375 g 12.5 mL/hr over 240 Minutes Intravenous Every 8 hours 01/30/15 1054     01/30/15 1200  vancomycin (VANCOCIN) IVPB 1000 mg/200 mL premix     1,000 mg 200 mL/hr over 60 Minutes Intravenous Every 24 hours 01/30/15 1054          Objective:   Filed Vitals:   02/01/15 0500 02/01/15 0600 02/01/15 0741 02/01/15 0748  BP: 116/61 121/57  118/60  Pulse: 89 91  92  Temp:    98.4 F (36.9 C)  TempSrc:    Oral  Resp: 16 16  20   Height:      Weight:      SpO2: 93% 94% 95% 97%    Wt Readings from Last 3 Encounters:  02/01/15 50.168 kg (110 lb 9.6 oz)  01/24/15 48.988 kg (108 lb)  12/21/14 44.7 kg (98 lb 8.7 oz)     Intake/Output Summary (Last 24 hours) at 02/01/15 0916 Last data filed at 02/01/15 [id number]  Gross per 24 hour  Intake 2705.83 ml  Output   2251 ml  Net 454.83 ml     Physical Exam  Awake Alert, Oriented X 3, No new F.N deficits, Normal affect [place].AT,PERRAL Supple Neck,No JVD, No cervical lymphadenopathy appriciated.  Symmetrical Chest wall movement, minimal air movement bilaterally, exp wheezes, coarse B sounds RRR,No Gallops,Rubs or new Murmurs, No Parasternal Heave +ve B.Sounds, Abd Soft, No tenderness, No organomegaly appriciated, No rebound - guarding or rigidity. No Cyanosis, Clubbing or edema, No new Rash or bruise       Data Review:   Micro Results Recent Results (from the past 240 hour(s))  Culture, Urine     Status: None   Collection Time: 01/30/15  3:47 PM  Result Value Ref Range Status   Specimen Description URINE, RANDOM  Final   Special Requests ADDED 2223  Final   Culture NO GROWTH 1 DAY  Final   Report Status 01/31/2015 FINAL  Final  Culture, blood (routine x 2) Call MD if unable to obtain prior to antibiotics being given     Status: None (Preliminary result)   Collection Time: 01/30/15  4:00 PM   Result Value Ref Range Status   Specimen Description BLOOD LEFT ANTECUBITAL  Final   Special Requests IN PEDIATRIC BOTTLE 1CC  Final   Culture NO GROWTH 2 DAYS  Final   Report Status PENDING  Incomplete  Culture, blood (routine x 2) Call MD if unable to obtain prior to antibiotics being given     Status: None (  Preliminary result)   Collection Time: 01/30/15  4:08 PM  Result Value Ref Range Status   Specimen Description BLOOD RIGHT HAND  Final   Special Requests IN PEDIATRIC BOTTLE 1CC  Final   Culture NO GROWTH 2 DAYS  Final   Report Status PENDING  Incomplete  MRSA PCR Screening     Status: None   Collection Time: 01/30/15  5:55 PM  Result Value Ref Range Status   MRSA by PCR NEGATIVE NEGATIVE Final    Comment:        The GeneXpert MRSA Assay (FDA approved for NASAL specimens only), is one component of a comprehensive MRSA colonization surveillance program. It is not intended to diagnose MRSA infection nor to guide or monitor treatment for MRSA infections.   Culture, sputum-assessment     Status: None   Collection Time: 01/31/15  7:33 AM  Result Value Ref Range Status   Specimen Description SPUTUM  Final   Special Requests NONE  Final   Sputum evaluation   Final    MICROSCOPIC FINDINGS SUGGEST THAT THIS SPECIMEN IS NOT REPRESENTATIVE OF LOWER RESPIRATORY SECRETIONS. PLEASE RECOLLECT. Gram Stain Report Called to,Read Back By and Verified With: Tonita Cong AT [id number] 01/31/15 BY L BENFIELD    Report Status 01/31/2015 FINAL  Final    Radiology Reports Dg Chest Port 1 View  02/01/2015  CLINICAL DATA:  Shortness of breath. EXAM: PORTABLE CHEST 1 VIEW COMPARISON:  01/30/2015. FINDINGS: Mediastinum hilar structures normal. Cardiomegaly with pulmonary vascular congestion and prominent interstitial changes consistent with congestive heart failure. Bibasilar atelectasis and/or pneumonia noted. Small left pleural effusion cannot be excluded. No pneumothorax. IMPRESSION: 1.  Cardiomegaly with mild pulmonary venous congestion interstitial prominence suggesting congestive heart failure. Small left pleural effusion cannot be excluded . 2. Bibasilar atelectasis and/or infiltrates again noted. Electronically Signed   By: Marcello Moores  Register   On: 02/01/2015 07:16   Dg Chest Portable 1 View  01/30/2015  CLINICAL DATA:  Worsening shortness of Breath for couple of days, history of COPD EXAM: PORTABLE CHEST 1 VIEW COMPARISON:  12/25/2014 FINDINGS: Cardiomediastinal silhouette is stable. Mild hyperinflation. There is patchy left basilar atelectasis or early infiltrate. No pulmonary edema. Atherosclerotic calcifications of thoracic aorta again noted. IMPRESSION: Mild hyperinflation again noted. Patchy left basilar atelectasis or early infiltrate. No pulmonary edema. Electronically Signed   By: Judith Boyer M.D.   On: 01/30/2015 10:14     CBC  Recent Labs Lab 01/30/15 0918 01/31/15 0245 02/01/15 0320  WBC 27.7* 22.5* 25.6*  HGB 10.8* 8.7* 9.1*  HCT 36.0 28.3* 31.1*  PLT 349 253 293  MCV 97.0 96.6 97.8  MCH 29.1 29.7 28.6  MCHC 30.0 30.7 29.3*  RDW 15.2 15.0 15.0  LYMPHSABS 0.8  --   --   MONOABS 2.2*  --   --   EOSABS 0.0  --   --   BASOSABS 0.0  --   --     Chemistries   Recent Labs Lab 01/30/15 0918 01/30/15 1600 01/31/15 0245 02/01/15 0320  NA 136  --  139 138  K 4.1  --  4.3 5.4*  CL 96*  --  104 101  CO2 30  --  27 31  GLUCOSE 298*  --  250* 260*  BUN 9  --  18 25*  CREATININE 0.37*  --  0.52 0.55  CALCIUM 8.6*  --  8.2* 8.6*  MG  --  1.9  --  2.1  AST  --   --  19  --   ALT  --   --  21  --   ALKPHOS  --   --  57  --   BILITOT  --   --  0.6  --    ------------------------------------------------------------------------------------------------------------------ estimated creatinine clearance is 49.4 mL/min (by C-G formula based on Cr of  0.55). ------------------------------------------------------------------------------------------------------------------  Recent Labs  01/30/15 1350  HGBA1C 7.2*   ------------------------------------------------------------------------------------------------------------------ No results for input(s): CHOL, HDL, LDLCALC, TRIG, CHOLHDL, LDLDIRECT in the last 72 hours. ------------------------------------------------------------------------------------------------------------------  Recent Labs  01/30/15 1600  TSH 0.685   ------------------------------------------------------------------------------------------------------------------ No results for input(s): VITAMINB12, FOLATE, FERRITIN, TIBC, IRON, RETICCTPCT in the last 72 hours.  Coagulation profile No results for input(s): INR, PROTIME in the last 168 hours.  No results for input(s): DDIMER in the last 72 hours.  Cardiac Enzymes No results for input(s): CKMB, TROPONINI, MYOGLOBIN in the last 168 hours.  Invalid input(s): CK ------------------------------------------------------------------------------------------------------------------ Invalid input(s): POCBNP   Time Spent in minutes   35   SINGH,PRASHANT K M.D on 02/01/2015 at 9:16 AM  Between 7am to 7pm - Pager - [phone]  After 7pm go to [email or web address] - password [facility]  [facility] -  Office  [phone]

## 2015-02-01 NOTE — Plan of Care (Signed)
Problem: Consults Goal: Respiratory Problems Patient Education See Patient Education Module for education specifics.  Outcome: Progressing Encouraged to cough and deep breathe.  Discussion about not wearing Bipap tonight unless she gets short of breathe per the patient

## 2015-02-01 NOTE — Telephone Encounter (Signed)
Check phone message from 12/22. Danielle from [place] would like these orders faxed to [place] at [phone]. Danielle's CB # [phone]

## 2015-02-01 NOTE — Progress Notes (Signed)
Name: Judith Boyer MRN: [id number] DOB: 1945/07/28    ADMISSION DATE:  01/30/2015 CONSULTATION DATE:  01/31/15  REFERRING MD :  Dr. Candiss Norse   CHIEF COMPLAINT:  SOB    HISTORY OF PRESENT ILLNESS:  69 y/o F, former smoker, with PMH of severe O2 dependent COPD, HTN, CAD s/p MI, HLD, DM II and anemia admitted 12/25 with complaints of worsening shortness of breath.    The patient reports she was recently discharged from a SNF for rehab on 12/21.  She indicates once she got home she had difficulty walking up her two front steps and she became very short of breath with the act of getting into the house.  She was able to rehab back to walking approximately 50-60 ft with assistance.  SNF notes reflect she also had concern for GIB with hgb drop from 11 to 8.  Plavix was held and BP medications reduced (cardizem and losartan).  She called EMS the day of admit when breathing treatments did not resolve shortness of breath.  She was treated with IV steroids & nebulized bronchodilators per EMS.  She denied fevers, chills, nausea / vomiting, diarrhea, chest pain, pain with inspiration.  ABG on admission notable for mild hypercarbic respiratory failure and she was placed on bipap.  TRH admitted for further evaluation. The patient was treated empirically for AECOPD and possible HCAP with left basilar atx on CXR.  PCCM consulted for evaluation 12/26.    The patient reports she wore bipap overnight and feels better am of 12/26 but not back to baseline.  She currently denies flu-like symptoms, fevers, chills, N/V/D, chest pain.    SUBJECTIVE: Feeling much better this morning but a little drowsy from the xanax she took last night. Still coughing up yellow sputum. Denies chest pain, vomiting but reports occasional nausea.   VITAL SIGNS: Temp:  [97.2 F (36.2 C)-98.4 F (36.9 C)] 98.4 F (36.9 C) (12/27 0748) Pulse Rate:  [35-101] 92 (12/27 0748) Resp:  [16-23] 20 (12/27 0748) BP: (111-142)/(52-70) 118/60  mmHg (12/27 0748) SpO2:  [83 %-100 %] 97 % (12/27 0748) Weight:  [50.168 kg (110 lb 9.6 oz)] 50.168 kg (110 lb 9.6 oz) (12/27 0400)  PHYSICAL EXAMINATION: General:  Chronically ill appearing female midl respiratory distress; sitting up in bed Neuro:  AAOx4, anxious, speech clear, MAE  HEENT:  MM pink/moist, no jvd  Cardiovascular:  s1s2 rrr, no m/r/g Lungs: Labored with minimal exertion, prolonged exp phase, lungs bilaterally diminished throughout, faint wheezes bilaterally Abdomen:  NTND, bsx4 active  Musculoskeletal:  No acute deformities  Skin:  Warm/dry, no peripheral edema    Recent Labs Lab 01/30/15 0918 01/31/15 0245 02/01/15 0320  NA 136 139 138  K 4.1 4.3 5.4*  CL 96* 104 101  CO2 30 27 31   BUN 9 18 25*  CREATININE 0.37* 0.52 0.55  GLUCOSE 298* 250* 260*    Recent Labs Lab 01/30/15 0918 01/31/15 0245 02/01/15 0320  HGB 10.8* 8.7* 9.1*  HCT 36.0 28.3* 31.1*  WBC 27.7* 22.5* 25.6*  PLT 349 253 293   Dg Chest 2 View  02/01/2015  CLINICAL DATA:  69 year old female with shortness of breath, cough and congestion. EXAM: CHEST  2 VIEW COMPARISON:  02/01/2015 and prior exams FINDINGS: Cardiomegaly identified. Left basilar opacity is unchanged. There is no evidence of pneumothorax, pulmonary edema or pulmonary mass. No acute bony abnormalities are identified. IMPRESSION: Unchanged appearance of the chest with continued left basilar opacity which may represent atelectasis or airspace  disease/ pneumonia. Electronically Signed   By: Margarette Canada M.D.   On: 02/01/2015 11:11   Dg Chest Port 1 View  02/01/2015  CLINICAL DATA:  Shortness of breath. EXAM: PORTABLE CHEST 1 VIEW COMPARISON:  01/30/2015. FINDINGS: Mediastinum hilar structures normal. Cardiomegaly with pulmonary vascular congestion and prominent interstitial changes consistent with congestive heart failure. Bibasilar atelectasis and/or pneumonia noted. Small left pleural effusion cannot be excluded. No pneumothorax.  IMPRESSION: 1. Cardiomegaly with mild pulmonary venous congestion interstitial prominence suggesting congestive heart failure. Small left pleural effusion cannot be excluded . 2. Bibasilar atelectasis and/or infiltrates again noted. Electronically Signed   By: Marcello Moores  Register   On: 02/01/2015 07:16    CULTURES:  UC 12/25 >>  BCx2 12/25 >>  Sputum 12/25 >>  RVP 12/25 >>   SIGNIFICANT EVENTS  12/25  Admit with increasing SOB, hypercarbic resp fx. Rx'd with bipap.  Questionable atelectasis versus left basilar infiltrate. Overall, interval improvement in respiratory status    ASSESSMENT / PLAN:  Acute on Chronic Hypoxic / Hypercapnic Respiratory Failure Acute Exacerbation of COPD Dyspnea   Oxygen Dependent COPD  Atelectasis LLL vs Infiltrate; unchanged from 12/26 CXR  Plan: O2 for sats 88-95%, baseline 3-4L No Intubation / DNI  PRN BiPAP for increased WOB  Brovana + Budesonide BID (adjust dosing) Mucinex  Solu-medrol, adjust dosing to 40 mg BID  Continue empiric abx   Daily CXR  Trial of low dose morphine with patient to determine response while inpatient - 2.5 mg once on 12/26.  Pt is very concerned about oversedation.  She wants to be alert / interactive as possible but would like relief from dyspnea.    F/U cultures  Anxiety   Plan: Adjust xanax to home dosing of 0.25 TID PRN Rest of treatment plan per primary team  Total time 20 minutes   Magdalene S. [facility] ANP-BC Pulmonary and Critical Care Medicine [facility] Pager: [phone]  02/01/2015, 11:26 AM   Attending:  I have seen and examined the patient with nurse practitioner/resident and agree with the note above.  My edits are in BOLD above.  We formulated the plan together and I elicited the following history.    She is not having a good day today. More dyspnea, uncomfortable.  Has not taken morphine.  On exam: poor air movement, mild wheeze but not much better than before CV: tachy, regular, no  mg  End stage COPD> she really needs to take more morphine for relief of dyspnea.  I think we are moving towards hospice here.  Will try to reach out to her family tomorrow to help them come to grips with this, but I think there is little we have to offer. I have re-ordered morphine.  Roselie Awkward, MD [place] [facility] Pager: [phone] Cell: [phone] After 3pm or if no response, call [phone]

## 2015-02-01 NOTE — Progress Notes (Signed)
Pt has been on 4L nasal cannula for the night, and tolerating well. Pt stated she did not feel the need to wear BiPAP for the night. RT has checked on pt several times, pt sleeping comfortably on nasal cannula. RT will continue to monitor as needed

## 2015-02-01 NOTE — Progress Notes (Signed)
Inpatient Diabetes Program Recommendations  AACE/ADA: New Consensus Statement on Inpatient Glycemic Control (2015)  Target Ranges:  Prepandial:   less than 140 mg/dL      Peak postprandial:   less than 180 mg/dL (1-2 hours)      Critically ill patients:  140 - 180 mg/dL   Review of Glycemic Control:  Results for GERTHA, HUTMACHER (MRN [id number]) as of 02/01/2015 10:35  Ref. Range 01/31/2015 12:26 01/31/2015 17:10 01/31/2015 20:05 01/31/2015 23:48 02/01/2015 07:26  Glucose-Capillary Latest Ref Range: 65-99 mg/dL 285 (H) 364 (H) 140 (H) 162 (H) 249 (H)   Diabetes history:   Type 2 diabetes Outpatient Diabetes medications: NPH 16 units q AM, Novolin R- 2-10 units tid with meals Current orders for Inpatient glycemic control:  Novolog resistant tid with meals, Lantus 20 units daily  Inpatient Diabetes Program Recommendations:    May consider adding Novolog meal coverage 4 units tid with meals -Hold if patient eats less than 50%.  Thanks, Adah Perl, RN, BC-ADM Inpatient Diabetes Coordinator Pager [phone] (8a-5p)

## 2015-02-01 NOTE — Progress Notes (Signed)
Pt c/o SOB did pursed lip breathing with pt. Given Morphine PO. Up to [facility] 1 assist 1 medium formed BM and 250 cc urine. Back to bed 02 @ 6 l n/c

## 2015-02-02 DIAGNOSIS — Z515 Encounter for palliative care: Secondary | ICD-10-CM

## 2015-02-02 DIAGNOSIS — R0602 Shortness of breath: Secondary | ICD-10-CM | POA: Insufficient documentation

## 2015-02-02 LAB — CBC
HCT: 33.7 % — ABNORMAL LOW (ref 36.0–46.0)
Hemoglobin: 9.7 g/dL — ABNORMAL LOW (ref 12.0–15.0)
MCH: 28.5 pg (ref 26.0–34.0)
MCHC: 28.8 g/dL — AB (ref 30.0–36.0)
MCV: 99.1 fL (ref 78.0–100.0)
PLATELETS: 325 10*3/uL (ref 150–400)
RBC: 3.4 MIL/uL — ABNORMAL LOW (ref 3.87–5.11)
RDW: 15 % (ref 11.5–15.5)
WBC: 22 10*3/uL — AB (ref 4.0–10.5)

## 2015-02-02 LAB — BASIC METABOLIC PANEL
ANION GAP: 9 (ref 5–15)
BUN: 24 mg/dL — ABNORMAL HIGH (ref 6–20)
CALCIUM: 8.7 mg/dL — AB (ref 8.9–10.3)
CO2: 36 mmol/L — ABNORMAL HIGH (ref 22–32)
CREATININE: 0.51 mg/dL (ref 0.44–1.00)
Chloride: 96 mmol/L — ABNORMAL LOW (ref 101–111)
GLUCOSE: 137 mg/dL — AB (ref 65–99)
Potassium: 4.3 mmol/L (ref 3.5–5.1)
Sodium: 141 mmol/L (ref 135–145)

## 2015-02-02 LAB — GLUCOSE, CAPILLARY
GLUCOSE-CAPILLARY: 209 mg/dL — AB (ref 65–99)
Glucose-Capillary: 149 mg/dL — ABNORMAL HIGH (ref 65–99)
Glucose-Capillary: 226 mg/dL — ABNORMAL HIGH (ref 65–99)
Glucose-Capillary: 80 mg/dL (ref 65–99)
Glucose-Capillary: 91 mg/dL (ref 65–99)

## 2015-02-02 MED ORDER — MORPHINE SULFATE (CONCENTRATE) 10 MG/0.5ML PO SOLN
5.0000 mg | ORAL | Status: DC | PRN
  Administered 2015-02-02 – 2015-02-03 (×2): 5 mg via ORAL
  Filled 2015-02-02 (×2): qty 0.5

## 2015-02-02 MED ORDER — MORPHINE SULFATE (CONCENTRATE) 10 MG/0.5ML PO SOLN
2.5000 mg | ORAL | Status: DC | PRN
  Filled 2015-02-02: qty 0.5

## 2015-02-02 NOTE — Progress Notes (Signed)
Pt is currently getting aerosol treatment. She stated if is hard to breath and is very uncomfortable. We will start BIPAP after treatment and attempt overnight.

## 2015-02-02 NOTE — Progress Notes (Signed)
Pharmacy Antibiotic Follow-up Note  Judith Boyer is a 69 y.o. year-old female admitted on 01/30/2015.  The patient is currently on day 4 of vancomycin and Zosyn for HCAP and COPD exacerbation.  Patient's renal function has been stable.  She remains afebrile and her WBC is trending back down (on steroid).   Plan: - Vanc 1gm IV Q24H - Zosyn 3.375gm IV Q8H, 4 hr infusion - Monitor renal fxn, clinical progress, vanc trough soon if abx haven't been narrowed - Monitor CBGs, improvement in dyspnea    Temp (24hrs), Avg:97.9 F (36.6 C), Min:97.2 F (36.2 C), Max:98.7 F (37.1 C)   Recent Labs Lab 01/30/15 0918 01/31/15 0245 02/01/15 0320 02/02/15 0324  WBC 27.7* 22.5* 25.6* 22.0*    Recent Labs Lab 01/30/15 0918 01/31/15 0245 02/01/15 0320 02/02/15 0324  CREATININE 0.37* 0.52 0.55 0.51   Estimated Creatinine Clearance: 49.4 mL/min (by C-G formula based on Cr of 0.51).    Allergies  Allergen Reactions  . Doxycycline Anaphylaxis  . Codeine Other (See Comments)    hyperactivity  . Dulera [Mometasone Furo-Formoterol Fum] Hives and Rash  . Adhesive [Tape] Rash  . Latex Rash  . Lisinopril Cough    Antimicrobials this admission: Vanc 12/25 >> Zosyn 12/25 >> Tamiflu 12/25 >> 12/26  Microbiology results: 12/25 BCx x2 - NGTD 12/25 RVP - 12/25 UCx - negative 12/26 sputum - negative   Yaphet Smethurst D. Mina Marble, PharmD, BCPS Pager:  [phone] 02/02/2015, 9:11 AM

## 2015-02-02 NOTE — Progress Notes (Signed)
OT Cancellation Note  Patient Details Name: Judith Boyer MRN: [id number] DOB: 1945/10/20   Cancelled Treatment:    Reason Eval/Treat Not Completed: Patient not medically ready (NP present and states no therapy right now due to SOB) Pt requesting medication ( specifically asked for xanax)   Vonita Moss   OTR/L Pager: [phone] Office: [phone] .  02/02/2015, 9:19 AM

## 2015-02-02 NOTE — Consult Note (Signed)
Consultation Note Date: 02/02/2015   Patient Name: Judith Boyer  DOB: 11-06-1945  MRN: [id number]  Age / Sex: 69 y.o., female  PCP: [name], MD Referring Physician: Thurnell Lose, MD  Reason for Consultation: Establishing goals of care, symptom management    Clinical Assessment/Narrative: I met today with Judith Boyer along with Judith Burn, PA observing. Judith Boyer is very SOB with accessory muscle usage. Very distressed and complaining of nausea. Discussed with patient and nurse use of antiemetic and using another dose of morphine (received dose recently and clearly not of any benefit at this point - will increase dose to 5 mg). Judith Boyer agrees to try this and that I can come back later to talk with Judith as she clearly cannot talk at this time.   I did come back and Judith Boyer was not much better and became very SOB even talking briefly with me and me allowing Judith time to recover and speak. She had not received any more morphine since I was there earlier. Judith Boyer tells me that she knows she is dying and "what else more is there to talk about." She is clearly uncomfortable discussing this subject but humors me. She tells me that Judith Boyer also know that she is dying. We discussed what Judith goals are if Judith time is limited and she is unsure. However she is clearly suffering at this time - I reassured Judith that my goal to Judith is to help Judith to feel better even if it is something we cannot fix we should be able to make Judith feel better. At this time she would not even be able to talk/visit with loved ones d/t Judith SOB and Judith QOL with this is extremely poor. We discussed the use of morphine and how this works to give Judith relief - she tells me "I'll try anything."   We also discuss where she goes from here as she says that she cannot go home as she does not have anyone there 24/7 - Judith son lives with Judith  but still works and is not always there. I mention the thought of utilizing the morphine and she may be able to do a little more and also the help of hospice and hospice facility. She says that Judith Boyer died at [facility] and is aware that to go here prognosis is weeks or less. She asks if this is Judith prognosis and I am unsure at this time but this may be an option for Judith.   She does confirm that she does not want intubation and I explain resuscitation and the limited benefit of performing CPR/shock if she is not breathing or intubated. She seems to understand but unable to make a decision today. She does give me permission to come back tomorrow and discuss further. Will f/u tomorrow on [place] and code status.   Contacts/Participants in Discussion: Primary Decision Maker: Self and then Sons    SUMMARY OF RECOMMENDATIONS - Utilize morphine to give some relief for severe SOB (will adjust to effectiveness) - Will discuss further Judith Temple, code status, and disposition  Code Status/Advance Care Planning: Limited code    Code Status Orders        Start     Ordered   01/30/15 1322  Limited resuscitation (code)   Continuous    Question Answer Comment  In the event of cardiac or respiratory ARREST: Initiate Code Blue, Call Rapid Response Yes   In the event of  cardiac or respiratory ARREST: Perform CPR Yes   In the event of cardiac or respiratory ARREST: Perform Intubation/Mechanical Ventilation No   In the event of cardiac or respiratory ARREST: Use NIPPV/BiPAp only if indicated Yes   In the event of cardiac or respiratory ARREST: Administer ACLS medications if indicated Yes   In the event of cardiac or respiratory ARREST: Perform Defibrillation or Cardioversion if indicated Yes      01/30/15 1321        Symptom Management:   SOB/dyspnea: Roxanol 5 mg every 2 hours prn. Roxanol 2.5 mg was ineffective for Judith and she had no relief that I noticed and was awake, alert, oriented after dose  received.   Continue Xanax 0.25 mg TID prn.   Palliative Prophylaxis:   Aspiration, Bowel Regimen, Eye Care, Frequent Pain Assessment and Turn Reposition   Psycho-social/Spiritual:  Support System: Adequate Desire for further Chaplaincy support:no Additional Recommendations: Caregiving  Support/Resources and Education on Hospice  Prognosis: Unable to determine - depends how she responds to symptom management. Definitely hospice eligible - possibly hospice facility.   Discharge Planning: To be determined.    Chief Complaint/ Primary Diagnoses: Present on Admission:  . HCAP (healthcare-associated pneumonia) . Hypertension . COPD exacerbation ([place]) . Vitamin B 12 deficiency . Protein-calorie malnutrition, severe ([place]) . Coronary artery disease involving native coronary artery of native heart without angina pectoris . Anxiety . Acute exacerbation of chronic obstructive pulmonary disease (COPD) ([place]) . Tobacco abuse . Diabetes mellitus with complication ([place]) . Acute on chronic respiratory failure with hypoxia and hypercapnia (HCC) . B12 deficiency . Tachycardia . Chronic respiratory failure with hypercapnia (HCC)  I have reviewed the medical record, interviewed the patient and family, and examined the patient. The following aspects are pertinent.  Past Medical History  Diagnosis Date  . COPD (chronic obstructive pulmonary disease) ([place])   . Hypertension   . Coronary artery disease   . Shortness of breath   . High cholesterol   . Myocardial infarction ([place]) 02/2011  . On home oxygen therapy     "2L; 24h/day" (12/20/2014)  . Pneumonia     "couple times" (02/08/2014)  . Type II diabetes mellitus ([place])     type 2  . Anemia   . History of blood transfusion 2014    "extremely anemic"   Social History   Social History  . Marital Status: Widowed    Spouse Name: N/A  . Number of Boyer: N/A  . Years of Education: N/A   Social History Main Topics  . Smoking status:  Former Smoker -- 1.00 packs/day for 50 years    Types: Cigarettes    Quit date: 01/10/2013  . Smokeless tobacco: Never Used  . Alcohol Use: No  . Drug Use: No  . Sexual Activity: No   Other Topics Concern  . None   Social History Narrative   Family History  Problem Relation Age of Onset  . Heart disease Maternal Grandmother   . Brain cancer Brother   . Lung cancer Boyer     smoked   Scheduled Meds: . antiseptic oral rinse  7 mL Mouth Rinse BID  . arformoterol  15 mcg Nebulization BID  . atorvastatin  10 mg Oral q1800  . budesonide (PULMICORT) nebulizer solution  0.5 mg Nebulization BID  . dextromethorphan-guaiFENesin  2 tablet Oral BID  . diltiazem  60 mg Oral 3 times per day  . enoxaparin (LOVENOX) injection  40 mg Subcutaneous Q24H  . feeding supplement (  GLUCERNA SHAKE)  237 mL Oral TID BM  . ferrous sulfate  325 mg Oral BID WC  . furosemide  20 mg Oral Daily  . insulin aspart  0-20 Units Subcutaneous TID WC  . insulin glargine  20 Units Subcutaneous Daily  . ipratropium  0.5 mg Nebulization Q6H  . levalbuterol  0.63 mg Nebulization Q6H  . losartan  25 mg Oral Daily  . methylPREDNISolone (SOLU-MEDROL) injection  40 mg Intravenous Q12H  . montelukast  10 mg Oral QHS  . pantoprazole  40 mg Oral BID  . piperacillin-tazobactam (ZOSYN)  IV  3.375 g Intravenous Q8H  . senna-docusate  2 tablet Oral QHS  . sodium chloride  3 mL Intravenous Q12H  . vancomycin  1,000 mg Intravenous Q24H   Continuous Infusions:  PRN Meds:.acetaminophen **OR** [DISCONTINUED] acetaminophen, ALPRAZolam, alum & mag hydroxide-simeth, bisacodyl, guaiFENesin-codeine, ipratropium, levalbuterol, morphine CONCENTRATE, nitroGLYCERIN, [DISCONTINUED] ondansetron **OR** ondansetron (ZOFRAN) IV, senna-docusate, sodium chloride, sodium phosphate, sorbitol Medications Prior to Admission:  Prior to Admission medications   Medication Sig Start Date End Date Taking? Authorizing Provider  acetaminophen (TYLENOL)  325 MG tablet Take 2 tablets (650 mg total) by mouth every 6 (six) hours as needed for mild pain (or Fever >/= 101). 12/31/14  Yes Albertine Patricia, MD  albuterol (PROAIR HFA) 108 (90 BASE) MCG/ACT inhaler Inhale 2 puffs into the lungs every 4 (four) hours as needed for wheezing or shortness of breath.   Yes Historical Provider, MD  albuterol (PROVENTIL) (2.5 MG/3ML) 0.083% nebulizer solution Take 2.5 mg by nebulization every 4 (four) hours as needed for wheezing or shortness of breath.  01/26/15  Yes Historical Provider, MD  ALPRAZolam Duanne Moron) 0.25 MG tablet Take 0.125-0.25 mg by mouth 3 (three) times daily as needed for anxiety.   Yes Historical Provider, MD  atorvastatin (LIPITOR) 10 MG tablet Take 10 mg by mouth daily after supper.    Yes Historical Provider, MD  budesonide (PULMICORT) 0.5 MG/2ML nebulizer solution Take 0.5 mg by nebulization every 12 (twelve) hours. 01/26/15  Yes Historical Provider, MD  feeding supplement, GLUCERNA SHAKE, (GLUCERNA SHAKE) LIQD Take 237 mLs by mouth 3 (three) times daily between meals. Patient taking differently: Take 237 mLs by mouth daily.  12/31/14  Yes Albertine Patricia, MD  ferrous sulfate 325 (65 FE) MG tablet Take 325 mg by mouth 2 (two) times daily with a meal.   Yes Historical Provider, MD  furosemide (LASIX) 20 MG tablet Take 20 mg by mouth daily.   Yes Historical Provider, MD  Guaifenesin (MUCINEX MAXIMUM STRENGTH) 1200 MG TB12 Take 1,200 mg by mouth 2 (two) times daily.   Yes Historical Provider, MD  insulin NPH Human (HUMULIN N,NOVOLIN N) 100 UNIT/ML injection Inject 16 Units into the skin daily before breakfast.   Yes Historical Provider, MD  insulin regular (NOVOLIN R,HUMULIN R) 100 units/mL injection Inject 2-10 Units into the skin 3 (three) times daily. Reported on 01/31/2015   Yes Historical Provider, MD  ipratropium-albuterol (DUONEB) 0.5-2.5 (3) MG/3ML SOLN Take 3 mLs by nebulization 4 (four) times daily - after meals and at bedtime. 12/31/14   Yes Albertine Patricia, MD  losartan (COZAAR) 25 MG tablet Take 25 mg by mouth at bedtime. Hold for SBP <110 01/26/15  Yes Historical Provider, MD  Melatonin 3 MG TABS Take 3 mg by mouth at bedtime.   Yes Historical Provider, MD  metFORMIN (GLUCOPHAGE) 500 MG tablet Take 500 mg by mouth 2 (two) times daily with a meal.  Yes Historical Provider, MD  montelukast (SINGULAIR) 10 MG tablet take 1 tablet by mouth once daily Patient taking differently: take 10 mg by mouth daily with supper 07/09/14  Yes [name], MD  OXYGEN Inhale 3 L into the lungs continuous.    Yes Historical Provider, MD  pantoprazole (PROTONIX) 40 MG tablet Take 40 mg by mouth 2 (two) times daily.   Yes Historical Provider, MD  polyethylene glycol (MIRALAX / GLYCOLAX) packet Take 17 g by mouth daily as needed (constipation).   Yes Historical Provider, MD  potassium chloride (K-DUR,KLOR-CON) 10 MEQ tablet Take 10 mEq by mouth daily.   Yes Historical Provider, MD  predniSONE (DELTASONE) 20 MG tablet Take 20 mg by mouth daily. 01/26/15  Yes Historical Provider, MD  senna-docusate (SENOKOT-S) 8.6-50 MG tablet Take 2 tablets by mouth at bedtime. 12/31/14  Yes Silver Huguenin Elgergawy, MD  sodium chloride (OCEAN) 0.65 % SOLN nasal spray Place 1 spray into both nostrils as needed for congestion. Patient taking differently: Place 1 spray into both nostrils daily as needed for congestion.  12/31/14  Yes Albertine Patricia, MD  ALPRAZolam Duanne Moron) 0.5 MG tablet Take 1 tablet (0.5 mg total) by mouth 3 (three) times daily as needed for anxiety. Patient not taking: Reported on 01/31/2015 01/19/15   Estill Dooms, MD  arformoterol [facility]) 15 MCG/2ML NEBU Take 2 mLs (15 mcg total) by nebulization 2 (two) times daily. Patient not taking: Reported on 01/31/2015 12/31/14   Silver Huguenin Elgergawy, MD  bisacodyl (DULCOLAX) 10 MG suppository Place 1 suppository (10 mg total) rectally daily as needed for moderate constipation. Patient not taking: Reported  on 01/31/2015 12/31/14   Silver Huguenin Elgergawy, MD  diltiazem (CARDIZEM) 90 MG tablet Take 1 tablet (90 mg total) by mouth every 8 (eight) hours. Patient taking differently: Take 90 mg by mouth every 8 (eight) hours. Hold is SBP is <110 and HR is <60 12/31/14   Albertine Patricia, MD  insulin aspart (NOVOLOG) 100 UNIT/ML injection Inject 10 Units into the skin 3 (three) times daily with meals. Patient not taking: Reported on 01/31/2015 12/31/14   Silver Huguenin Elgergawy, MD  nitroGLYCERIN (NITROSTAT) 0.4 MG SL tablet Place 1 tablet (0.4 mg total) under the tongue every 5 (five) minutes x 3 doses as needed for chest pain. Patient not taking: Reported on 01/31/2015 02/07/12   Dixie Dials, MD  predniSONE (DELTASONE) 10 MG tablet Please take 40 mg oral daily for 1 week, then 30 mg oral daily for 1 week, then 20 mg oral daily thereafter . Patient not taking: Reported on 01/31/2015 12/31/14   Albertine Patricia, MD   Allergies  Allergen Reactions  . Doxycycline Anaphylaxis  . Codeine Other (See Comments)    hyperactivity  . Dulera [Mometasone Furo-Formoterol Fum] Hives and Rash  . Adhesive [Tape] Rash  . Latex Rash  . Lisinopril Cough    Review of Systems  Respiratory: Positive for shortness of breath.   Gastrointestinal: Positive for nausea.  Neurological: Positive for weakness.  Psychiatric/Behavioral: The patient is nervous/anxious.     Physical Exam  Constitutional: She is oriented to person, place, and time. She appears well-developed and well-nourished.  HENT:  Head: Normocephalic and atraumatic.  Cardiovascular: Normal rate.   Respiratory: Accessory muscle usage present. She is in respiratory distress.  GI: Normal appearance.  Neurological: She is alert and oriented to person, place, and time.    Vital Signs: BP 147/64 mmHg  Pulse 95  Temp(Src) 98.2 F (36.8  C) (Oral)  Resp 22  Ht _0  (1.549 m)  Wt 50.803 kg (112 lb)  BMI 21.17 kg/m2  SpO2 95%  SpO2: SpO2: 95 % O2  Device:SpO2: 95 % O2 Flow Rate: .O2 Flow Rate (L/min): 5 L/min  IO: Intake/output summary:  Intake/Output Summary (Last 24 hours) at 02/02/15 0956 Last data filed at 02/02/15 0900  Gross per 24 hour  Intake   1167 ml  Output    800 ml  Net    367 ml    LBM: Last BM Date: 01/28/15 (On Senokot-S) Baseline Weight: Weight: 48.081 kg (106 lb) Most recent weight: Weight: 50.803 kg (112 lb)      Palliative Assessment/Data:    Additional Data Reviewed:  CBC:    Component Value Date/Time   WBC 22.0* 02/02/2015 0324   WBC 10.0 01/19/2015   WBC 10.4* 05/01/2014 1253   HGB 9.7* 02/02/2015 0324   HGB 12.7 05/01/2014 1253   HCT 33.7* 02/02/2015 0324   HCT 41.6 05/01/2014 1253   PLT 325 02/02/2015 0324   MCV 99.1 02/02/2015 0324   MCV 93.4 05/01/2014 1253   NEUTROABS 24.7* 01/30/2015 0918   LYMPHSABS 0.8 01/30/2015 0918   MONOABS 2.2* 01/30/2015 0918   EOSABS 0.0 01/30/2015 0918   BASOSABS 0.0 01/30/2015 0918   Comprehensive Metabolic Panel:    Component Value Date/Time   NA 141 02/02/2015 0324   NA 144 01/10/2015   K 4.3 02/02/2015 0324   CL 96* 02/02/2015 0324   CO2 36* 02/02/2015 0324   BUN 24* 02/02/2015 0324   BUN 15 01/10/2015   CREATININE 0.51 02/02/2015 0324   CREATININE 0.3* 01/10/2015   CREATININE 0.46* 11/18/2014 1153   GLUCOSE 137* 02/02/2015 0324   CALCIUM 8.7* 02/02/2015 0324   AST 19 01/31/2015 0245   ALT 21 01/31/2015 0245   ALKPHOS 57 01/31/2015 0245   BILITOT 0.6 01/31/2015 0245   PROT 5.0* 01/31/2015 0245   ALBUMIN 2.7* 01/31/2015 0245     Time In/Out: 8718-3672, 1100-1130 Time Total: 66mn Greater than 50%  of this time was spent counseling and coordinating care related to the above assessment and plan.  Signed by: PPershing Proud NP  APershing Proud NP  155/00/1642 9:56 AM  Please contact Palliative Medicine Team phone at [phone] questions and concerns.

## 2015-02-02 NOTE — Progress Notes (Signed)
PT Cancellation Note  Patient Details Name: Judith Boyer MRN: [id number] DOB: [birth year]   Cancelled Treatment:    Reason Eval/Treat Not Completed: Medical issues which prohibited therapy, pt with increased dyspnea today, does not feel that she can work with therapy and RN requests therapy hold as well.    [place], [place] 02/02/2015, 12:58 PM

## 2015-02-02 NOTE — Telephone Encounter (Signed)
Judith Boyer you faxed these right?

## 2015-02-02 NOTE — Telephone Encounter (Signed)
I never received them.  It looks like you had questions on them for one of the providers and were planning to send them.  Jannette Spanner, CMA Signed Jannette Spanner, [facility] 01/28/2015 9:19 AM    Telephone Encounter    Expand All Collapse All   Can you help answer some of the questions in the order pended? Then I can fax.

## 2015-02-02 NOTE — Progress Notes (Signed)
Patient Demographics:    Judith Boyer, is a 69 y.o. female, DOB - [birth year], [id number]  Admit date - 01/30/2015   Admitting Physician Eugenie Filler, MD  Outpatient Primary MD for the patient is DAUB, [name], MD  LOS - 3   Chief Complaint  Patient presents with  . Shortness of Breath        Subjective:    Judith Boyer today has, No headache, No chest pain, No abdominal pain - No Nausea, No new weakness tingling or numbness, continues to have cough with shortness of breath overall mildly improved.   Assessment  & Plan :     1. Acute on chronic hypoxic and hypercapnic respiratory failure due to COPD exacerbation in the setting of HCAP. She has advanced COPD, currently on empiric IV antibiotics with HCAP along with IV steroids, on nebulizer treatments, needed BiPAP initially. Overall mildly improved but appears to have extremely advanced underlying COPD.   Discussed with the patient she does not want to get intubated and I told her that her prognosis appears poor. Pulmonary has been consulted, I have also consulted Pall Care to discuss goals of care as her long-term prognosis appears poor.   2. Severe protein calorie malnutrition. On nutritional supplements.   3. Dyslipidemia. On statin.   4. GERD. PPI continued.   5. Essential hypertension. On ARB continue for now.   6. History of CAD. On statin, as pain-free, EKG with nonspecific changes. Supportive care.   7. DM type II. Continue sliding scale for now, add low-dose Lantus for better control while she is on steroids  Lab Results  Component Value Date   HGBA1C 7.2* 01/30/2015    CBG (last 3)   Recent Labs  02/01/15 1606 02/01/15 2027 02/02/15 0820  GLUCAP 324* 96 149*      Code Status : No intubation  prognosis is guarded  Family Communication  : None present  Disposition Plan  : TBD  Consults  :  PCCM  Procedures  :    DVT Prophylaxis  :  Lovenox   Lab Results  Component Value Date   PLT 325 02/02/2015    Inpatient Medications  Scheduled Meds: . antiseptic oral rinse  7 mL Mouth Rinse BID  . arformoterol  15 mcg Nebulization BID  . atorvastatin  10 mg Oral q1800  . budesonide (PULMICORT) nebulizer solution  0.5 mg Nebulization BID  . dextromethorphan-guaiFENesin  2 tablet Oral BID  . diltiazem  60 mg Oral 3 times per day  . enoxaparin (LOVENOX) injection  40 mg Subcutaneous Q24H  . feeding supplement (GLUCERNA SHAKE)  237 mL Oral TID BM  . ferrous sulfate  325 mg Oral BID WC  . furosemide  20 mg Oral Daily  . insulin aspart  0-20 Units Subcutaneous TID WC  . insulin glargine  20 Units Subcutaneous Daily  . ipratropium  0.5 mg Nebulization Q6H  . levalbuterol  0.63 mg Nebulization Q6H  . losartan  25 mg Oral Daily  . methylPREDNISolone (SOLU-MEDROL) injection  40 mg Intravenous Q12H  . montelukast  10 mg Oral QHS  . pantoprazole  40 mg Oral BID  . piperacillin-tazobactam (ZOSYN)  IV  3.375 g Intravenous Q8H  . senna-docusate  2  tablet Oral QHS  . sodium chloride  3 mL Intravenous Q12H  . vancomycin  1,000 mg Intravenous Q24H   Continuous Infusions:   PRN Meds:.acetaminophen **OR** [DISCONTINUED] acetaminophen, ALPRAZolam, alum & mag hydroxide-simeth, bisacodyl, guaiFENesin-codeine, ipratropium, levalbuterol, morphine CONCENTRATE, nitroGLYCERIN, [DISCONTINUED] ondansetron **OR** ondansetron (ZOFRAN) IV, senna-docusate, sodium chloride, sodium phosphate, sorbitol  Antibiotics  :     Anti-infectives    Start     Dose/Rate Route Frequency Ordered Stop   01/30/15 1430  oseltamivir (TAMIFLU) capsule 30 mg  Status:  Discontinued     30 mg Oral 2 times daily 01/30/15 1326 01/31/15 1013   01/30/15 1400  ceFEPIme (MAXIPIME) 1 g in dextrose 5 % 50 mL IVPB  Status:   Discontinued     1 g 100 mL/hr over 30 Minutes Intravenous 3 times per day 01/30/15 1211 01/30/15 1211   01/30/15 1330  oseltamivir (TAMIFLU) capsule 75 mg  Status:  Discontinued     75 mg Oral 2 times daily 01/30/15 1321 01/30/15 1325   01/30/15 1200  piperacillin-tazobactam (ZOSYN) IVPB 3.375 g     3.375 g 12.5 mL/hr over 240 Minutes Intravenous Every 8 hours 01/30/15 1054     01/30/15 1200  vancomycin (VANCOCIN) IVPB 1000 mg/200 mL premix     1,000 mg 200 mL/hr over 60 Minutes Intravenous Every 24 hours 01/30/15 1054          Objective:   Filed Vitals:   02/02/15 0744 02/02/15 0749 02/02/15 0821 02/02/15 0934  BP:   147/64   Pulse:   95   Temp:   98.2 F (36.8 C)   TempSrc:   Oral   Resp:   22   Height:      Weight:      SpO2: 98% 99% 99% 95%    Wt Readings from Last 3 Encounters:  02/02/15 50.803 kg (112 lb)  01/24/15 48.988 kg (108 lb)  12/21/14 44.7 kg (98 lb 8.7 oz)     Intake/Output Summary (Last 24 hours) at 02/02/15 0943 Last data filed at 02/02/15 0900  Gross per 24 hour  Intake   1167 ml  Output    800 ml  Net    367 ml     Physical Exam  Awake Alert, Oriented X 3, No new F.N deficits, Normal affect [place].AT,PERRAL Supple Neck,No JVD, No cervical lymphadenopathy appriciated.  Symmetrical Chest wall movement, minimal air movement bilaterally, exp wheezes, coarse B sounds RRR,No Gallops,Rubs or new Murmurs, No Parasternal Heave +ve B.Sounds, Abd Soft, No tenderness, No organomegaly appriciated, No rebound - guarding or rigidity. No Cyanosis, Clubbing or edema, No new Rash or bruise       Data Review:   Micro Results Recent Results (from the past 240 hour(s))  Culture, Urine     Status: None   Collection Time: 01/30/15  3:47 PM  Result Value Ref Range Status   Specimen Description URINE, RANDOM  Final   Special Requests ADDED 2223  Final   Culture NO GROWTH 1 DAY  Final   Report Status 01/31/2015 FINAL  Final  Culture, blood (routine x 2) Call  MD if unable to obtain prior to antibiotics being given     Status: None (Preliminary result)   Collection Time: 01/30/15  4:00 PM  Result Value Ref Range Status   Specimen Description BLOOD LEFT ANTECUBITAL  Final   Special Requests IN PEDIATRIC BOTTLE 1CC  Final   Culture NO GROWTH 2 DAYS  Final   Report Status PENDING  Incomplete  Culture, blood (routine x 2) Call MD if unable to obtain prior to antibiotics being given     Status: None (Preliminary result)   Collection Time: 01/30/15  4:08 PM  Result Value Ref Range Status   Specimen Description BLOOD RIGHT HAND  Final   Special Requests IN PEDIATRIC BOTTLE [place]  Final   Culture NO GROWTH 2 DAYS  Final   Report Status PENDING  Incomplete  MRSA PCR Screening     Status: None   Collection Time: 01/30/15  5:55 PM  Result Value Ref Range Status   MRSA by PCR NEGATIVE NEGATIVE Final    Comment:        The GeneXpert MRSA Assay (FDA approved for NASAL specimens only), is one component of a comprehensive MRSA colonization surveillance program. It is not intended to diagnose MRSA infection nor to guide or monitor treatment for MRSA infections.   Culture, sputum-assessment     Status: None   Collection Time: 01/31/15  7:33 AM  Result Value Ref Range Status   Specimen Description SPUTUM  Final   Special Requests NONE  Final   Sputum evaluation   Final    MICROSCOPIC FINDINGS SUGGEST THAT THIS SPECIMEN IS NOT REPRESENTATIVE OF LOWER RESPIRATORY SECRETIONS. PLEASE RECOLLECT. Gram Stain Report Called to,Read Back By and Verified With: Tonita Cong AT [id number] 01/31/15 BY L BENFIELD    Report Status 01/31/2015 FINAL  Final    Radiology Reports Dg Chest 2 View  02/01/2015  CLINICAL DATA:  69 year old female with shortness of breath, cough and congestion. EXAM: CHEST  2 VIEW COMPARISON:  02/01/2015 and prior exams FINDINGS: Cardiomegaly identified. Left basilar opacity is unchanged. There is no evidence of pneumothorax, pulmonary edema or  pulmonary mass. No acute bony abnormalities are identified. IMPRESSION: Unchanged appearance of the chest with continued left basilar opacity which may represent atelectasis or airspace disease/ pneumonia. Electronically Signed   By: Margarette Canada M.D.   On: 02/01/2015 11:11   Dg Chest Port 1 View  02/01/2015  CLINICAL DATA:  Shortness of breath. EXAM: PORTABLE CHEST 1 VIEW COMPARISON:  01/30/2015. FINDINGS: Mediastinum hilar structures normal. Cardiomegaly with pulmonary vascular congestion and prominent interstitial changes consistent with congestive heart failure. Bibasilar atelectasis and/or pneumonia noted. Small left pleural effusion cannot be excluded. No pneumothorax. IMPRESSION: 1. Cardiomegaly with mild pulmonary venous congestion interstitial prominence suggesting congestive heart failure. Small left pleural effusion cannot be excluded . 2. Bibasilar atelectasis and/or infiltrates again noted. Electronically Signed   By: Marcello Moores  Register   On: 02/01/2015 07:16   Dg Chest Portable 1 View  01/30/2015  CLINICAL DATA:  Worsening shortness of Breath for couple of days, history of COPD EXAM: PORTABLE CHEST 1 VIEW COMPARISON:  12/25/2014 FINDINGS: Cardiomediastinal silhouette is stable. Mild hyperinflation. There is patchy left basilar atelectasis or early infiltrate. No pulmonary edema. Atherosclerotic calcifications of thoracic aorta again noted. IMPRESSION: Mild hyperinflation again noted. Patchy left basilar atelectasis or early infiltrate. No pulmonary edema. Electronically Signed   By: Judith Boyer M.D.   On: 01/30/2015 10:14     CBC  Recent Labs Lab 01/30/15 0918 01/31/15 0245 02/01/15 0320 02/02/15 0324  WBC 27.7* 22.5* 25.6* 22.0*  HGB 10.8* 8.7* 9.1* 9.7*  HCT 36.0 28.3* 31.1* 33.7*  PLT 349 253 293 325  MCV 97.0 96.6 97.8 99.1  MCH 29.1 29.7 28.6 28.5  MCHC 30.0 30.7 29.3* 28.8*  RDW 15.2 15.0 15.0 15.0  LYMPHSABS 0.8  --   --   --  MONOABS 2.2*  --   --   --   EOSABS 0.0  --    --   --   BASOSABS 0.0  --   --   --     Chemistries   Recent Labs Lab 01/30/15 0918 01/30/15 1600 01/31/15 0245 02/01/15 0320 02/01/15 1615 02/02/15 0324  NA 136  --  139 138  --  141  K 4.1  --  4.3 5.4* 4.4 4.3  CL 96*  --  104 101  --  96*  CO2 30  --  27 31  --  36*  GLUCOSE 298*  --  250* 260*  --  137*  BUN 9  --  18 25*  --  24*  CREATININE 0.37*  --  0.52 0.55  --  0.51  CALCIUM 8.6*  --  8.2* 8.6*  --  8.7*  MG  --  1.9  --  2.1  --   --   AST  --   --  19  --   --   --   ALT  --   --  21  --   --   --   ALKPHOS  --   --  57  --   --   --   BILITOT  --   --  0.6  --   --   --    ------------------------------------------------------------------------------------------------------------------ estimated creatinine clearance is 49.4 mL/min (by C-G formula based on Cr of 0.51). ------------------------------------------------------------------------------------------------------------------  Recent Labs  01/30/15 1350  HGBA1C 7.2*   ------------------------------------------------------------------------------------------------------------------ No results for input(s): CHOL, HDL, LDLCALC, TRIG, CHOLHDL, LDLDIRECT in the last 72 hours. ------------------------------------------------------------------------------------------------------------------  Recent Labs  01/30/15 1600  TSH 0.685   ------------------------------------------------------------------------------------------------------------------ No results for input(s): VITAMINB12, FOLATE, FERRITIN, TIBC, IRON, RETICCTPCT in the last 72 hours.  Coagulation profile No results for input(s): INR, PROTIME in the last 168 hours.  No results for input(s): DDIMER in the last 72 hours.  Cardiac Enzymes No results for input(s): CKMB, TROPONINI, MYOGLOBIN in the last 168 hours.  Invalid input(s):  CK ------------------------------------------------------------------------------------------------------------------ Invalid input(s): POCBNP   Time Spent in minutes   35   SINGH,PRASHANT K M.D on 02/02/2015 at 9:43 AM  Between 7am to 7pm - Pager - [phone]  After 7pm go to [email or web address] - password [facility]  [facility] -  Office  [phone]

## 2015-02-02 NOTE — Telephone Encounter (Signed)
It looks like we sent the information to [facility] on 12/23. Called Danielle and left message to call back.

## 2015-02-02 NOTE — Progress Notes (Signed)
   02/02/15 1500  Clinical Encounter Type  Visited With Patient not available  Visit Type Initial  Wanted to see patient because of condition as learned in huddle. However, she was sleeping soundly.

## 2015-02-02 NOTE — Progress Notes (Signed)
Name: Judith Boyer MRN: [id number] DOB: 03/23/1945    ADMISSION DATE:  01/30/2015 CONSULTATION DATE:  01/31/15  REFERRING MD :  Dr. Candiss Norse   CHIEF COMPLAINT:  SOB    HISTORY OF PRESENT ILLNESS:  69 y/o F, former smoker, with PMH of severe O2 dependent COPD, HTN, CAD s/p MI, HLD, DM II and anemia admitted 12/25 with complaints of worsening shortness of breath.    The patient reports she was recently discharged from a SNF for rehab on 12/21.  She indicates once she got home she had difficulty walking up her two front steps and she became very short of breath with the act of getting into the house.  She was able to rehab back to walking approximately 50-60 ft with assistance.  SNF notes reflect she also had concern for GIB with hgb drop from 11 to 8.  Plavix was held and BP medications reduced (cardizem and losartan).  She called EMS the day of admit when breathing treatments did not resolve shortness of breath.  She was treated with IV steroids & nebulized bronchodilators per EMS.  She denied fevers, chills, nausea / vomiting, diarrhea, chest pain, pain with inspiration.  ABG on admission notable for mild hypercarbic respiratory failure and she was placed on bipap.  TRH admitted for further evaluation. The patient was treated empirically for AECOPD and possible HCAP with left basilar atx on CXR.  PCCM consulted for evaluation 12/26.    The patient reports she wore bipap overnight and feels better am of 12/26 but not back to baseline.  She currently denies flu-like symptoms, fevers, chills, N/V/D, chest pain.    SUBJECTIVE: Persistent sob at rest and with minimal exertion; moderate relieve with morphine, atrovent and xanax.  Still coughing up yellow sputum. Denies fever, chills and hemoptysis. Awaiting palliative care consult  VITAL SIGNS: Temp:  [97.2 F (36.2 C)-98.7 F (37.1 C)] 98.2 F (36.8 C) (12/28 0821) Pulse Rate:  [74-108] 95 (12/28 0821) Resp:  [14-29] 22 (12/28 0821) BP:  (115-149)/(52-73) 147/64 mmHg (12/28 0821) SpO2:  [95 %-99 %] 99 % (12/28 0821) FiO2 (%):  [40 %] 40 % (12/28 0250) Weight:  [50.803 kg (112 lb)] 50.803 kg (112 lb) (12/28 0500)  PHYSICAL EXAMINATION: General:  Chronically ill appearing female moderate respiratory distress; sitting up in bed Neuro:  AAOx4, anxious, speech clear, MAE  HEENT:  MM pink/moist, no jvd  Cardiovascular:  s1s2 rrr, no m/r/g Lungs: Labored with minimal exertion, prolonged expiratory phase, airflow diminished in all lung fields, faint expiratory wheezes bilaterally Abdomen:  NTND, bsx4 active  Musculoskeletal:  No acute deformities  Skin:  Warm/dry, no peripheral edema    Recent Labs Lab 01/31/15 0245 02/01/15 0320 02/01/15 1615 02/02/15 0324  NA 139 138  --  141  K 4.3 5.4* 4.4 4.3  CL 104 101  --  96*  CO2 27 31  --  36*  BUN 18 25*  --  24*  CREATININE 0.52 0.55  --  0.51  GLUCOSE 250* 260*  --  137*    Recent Labs Lab 01/31/15 0245 02/01/15 0320 02/02/15 0324  HGB 8.7* 9.1* 9.7*  HCT 28.3* 31.1* 33.7*  WBC 22.5* 25.6* 22.0*  PLT 253 293 325   Dg Chest 2 View  02/01/2015  CLINICAL DATA:  69 year old female with shortness of breath, cough and congestion. EXAM: CHEST  2 VIEW COMPARISON:  02/01/2015 and prior exams FINDINGS: Cardiomegaly identified. Left basilar opacity is unchanged. There is no evidence of  pneumothorax, pulmonary edema or pulmonary mass. No acute bony abnormalities are identified. IMPRESSION: Unchanged appearance of the chest with continued left basilar opacity which may represent atelectasis or airspace disease/ pneumonia. Electronically Signed   By: Margarette Canada M.D.   On: 02/01/2015 11:11   Dg Chest Port 1 View  02/01/2015  CLINICAL DATA:  Shortness of breath. EXAM: PORTABLE CHEST 1 VIEW COMPARISON:  01/30/2015. FINDINGS: Mediastinum hilar structures normal. Cardiomegaly with pulmonary vascular congestion and prominent interstitial changes consistent with congestive heart  failure. Bibasilar atelectasis and/or pneumonia noted. Small left pleural effusion cannot be excluded. No pneumothorax. IMPRESSION: 1. Cardiomegaly with mild pulmonary venous congestion interstitial prominence suggesting congestive heart failure. Small left pleural effusion cannot be excluded . 2. Bibasilar atelectasis and/or infiltrates again noted. Electronically Signed   By: Marcello Moores  Register   On: 02/01/2015 07:16    CULTURES:  UC 12/25 >>  BCx2 12/25 >>  Sputum 12/25 >>  RVP 12/25 >>   SIGNIFICANT EVENTS  12/25  Admit with increasing SOB, hypercarbic resp fx. Rx'd with bipap.  Questionable atelectasis versus left basilar infiltrate. Overall, interval improvement in respiratory status but not at baseline. This  Might be her new baseline. Patient is aware of prognosis and has requested palliative care.    ASSESSMENT / PLAN:  Acute on Chronic Hypoxic / Hypercapnic Respiratory Failure Acute Exacerbation of COPD Dyspnea   Oxygen Dependent COPD  Atelectasis LLL vs Infiltrate; unchanged from 12/26 CXR  Plan: O2 for sats 88-95%, baseline 3-4L No Intubation / DNI  PRN BiPAP for increased WOB  [place],  Budesonide, ipratropium, and  xopenex nebulizer(adjust dosing) Mucinex  Solu-medrol, adjust dosing to 40 mg BID  Continue empiric antibiotics CXR prn Continue low dose morphine and stagger with prn xanax to avoid oversedation.    F/U cultures  Anxiety   Plan: Adjust xanax to home dosing of 0.25 TID PRN  Rest of treatment plan per primary team  Total time 20 minutes   Magdalene [facility] ANP-BC Pulmonary and Critical Care Medicine [facility] Pager: [phone]  02/02/2015, 9:01 AM

## 2015-02-03 ENCOUNTER — Inpatient Hospital Stay: Payer: Medicare Other | Admitting: Emergency Medicine

## 2015-02-03 DIAGNOSIS — J189 Pneumonia, unspecified organism: Secondary | ICD-10-CM

## 2015-02-03 DIAGNOSIS — E43 Unspecified severe protein-calorie malnutrition: Secondary | ICD-10-CM

## 2015-02-03 DIAGNOSIS — E119 Type 2 diabetes mellitus without complications: Secondary | ICD-10-CM

## 2015-02-03 LAB — GLUCOSE, CAPILLARY
GLUCOSE-CAPILLARY: 222 mg/dL — AB (ref 65–99)
GLUCOSE-CAPILLARY: 260 mg/dL — AB (ref 65–99)
Glucose-Capillary: 167 mg/dL — ABNORMAL HIGH (ref 65–99)
Glucose-Capillary: 115 mg/dL — ABNORMAL HIGH (ref 65–99)
Glucose-Capillary: 78 mg/dL (ref 65–99)

## 2015-02-03 MED ORDER — PREDNISONE 50 MG PO TABS
50.0000 mg | ORAL_TABLET | Freq: Every day | ORAL | Status: DC
  Administered 2015-02-04 – 2015-02-05 (×2): 50 mg via ORAL
  Filled 2015-02-03 (×2): qty 1

## 2015-02-03 MED ORDER — MORPHINE SULFATE (CONCENTRATE) 10 MG/0.5ML PO SOLN
10.0000 mg | Freq: Three times a day (TID) | ORAL | Status: DC
  Administered 2015-02-03: 10 mg via ORAL
  Filled 2015-02-03 (×3): qty 0.5

## 2015-02-03 MED ORDER — LEVOFLOXACIN 750 MG PO TABS
750.0000 mg | ORAL_TABLET | Freq: Every day | ORAL | Status: DC
  Administered 2015-02-03 – 2015-02-05 (×3): 750 mg via ORAL
  Filled 2015-02-03 (×3): qty 1

## 2015-02-03 MED ORDER — MORPHINE SULFATE (PF) 2 MG/ML IV SOLN: 2.0000 mg | INTRAVENOUS | Status: DC | PRN

## 2015-02-03 NOTE — Progress Notes (Signed)
Patient has arrived to [address] and is oriented to unit. Patient is stable. Report received from [place] on [place], [place]

## 2015-02-03 NOTE — Care Management Important Message (Signed)
Important Message  Patient Details  Name: Judith Boyer MRN: [id number] Date of Birth: 03-27-1945   Medicare Important Message Given:  Yes    [name] May 02/03/2015, 12:43 PM

## 2015-02-03 NOTE — NC FL2 (Signed)
[place] LEVEL OF CARE SCREENING TOOL     IDENTIFICATION  Patient Name: Judith Boyer Birthdate: 1945/03/19 Sex: female Admission Date (Current Location): 01/30/2015  [facility] and [place] Number:  Herbalist and Address:  [facility] [place], [place] [address] [place], [place] [address]      Provider Number: [id number]  Attending Physician Name and Address:  Thurnell Lose, MD  Relative Name and Phone Number:  Mikalah Rolls [phone]    Current Level of Care: Hospital Recommended Level of Care: [place] Prior Approval Number:    Date Approved/Denied:   PASRR Number: [id number]  Discharge Plan: SNF    Current Diagnoses: Patient Active Problem List   Diagnosis Date Noted  . SOB (shortness of breath)   . Acute respiratory failure ([place]) 01/30/2015  . HCAP (healthcare-associated pneumonia) 01/30/2015  . DNR (do not resuscitate) discussion 12/29/2014  . Acute on chronic respiratory failure with hypoxia and hypercapnia (HCC)   . Hyperkalemia 12/25/2014  . Tobacco abuse   . Noncompliance   . Acute on chronic respiratory failure with hypoxia ([place])   . Acute exacerbation of chronic obstructive pulmonary disease (COPD) ([place])   . Diabetes mellitus with complication ([place])   . Chronic respiratory failure with hypercapnia ([place]) 09/24/2014  . Palliative care by specialist   . Dysphagia   . Dyspnea 08/13/2014  . Tachycardia   . Respiratory acidosis   . Coronary artery disease involving native coronary artery of native heart without angina pectoris   . Anxiety   . B12 deficiency 02/10/2014  . Protein-calorie malnutrition, severe ([place]) 02/09/2014  . Acute respiratory distress (HCC) 02/08/2014  . Vitamin B 12 deficiency 06/28/2013  . Palliative care encounter 02/25/2013  . Weakness generalized 02/25/2013  . COPD exacerbation ([place]) 02/20/2013  . Anemia 02/20/2013  . Nocturnal hypoxemia 12/30/2012  . COPD  GOLD III 02/06/2011  . Smoker 02/06/2011  . Hypertension 02/06/2011  . Diabetes mellitus type 2, controlled, without complications ([place]) 123456    Orientation RESPIRATION BLADDER Height & Weight    Self, Time, Situation, Place  Normal Continent 5\' 1"  (154.9 cm) 113 lbs.  BEHAVIORAL SYMPTOMS/MOOD NEUROLOGICAL BOWEL NUTRITION STATUS      Continent  (Please see DC Summary)  AMBULATORY STATUS COMMUNICATION OF NEEDS Skin   Extensive Assist Verbally Normal                       Personal Care Assistance Level of Assistance  Bathing, Feeding, Dressing Bathing Assistance: Maximum assistance Feeding assistance: Independent Dressing Assistance: Maximum assistance     Functional Limitations Info  Hearing   Hearing Info: Impaired      SPECIAL CARE FACTORS FREQUENCY  PT (By licensed PT)     PT Frequency: 5              Contractures      Additional Factors Info  Code Status, Allergies, Psychotropic, Insulin Sliding Scale Code Status Info: Partial Code Allergies Info: Doxycycline, Codeine, Dulera, Adhesive, Latex, Lisinopril Psychotropic Info: Xanax PRN Insulin Sliding Scale Info: 3 times daily       Current Medications (02/03/2015):  This is the current hospital active medication list Current Facility-Administered Medications  Medication Dose Route Frequency Provider Last Rate Last Dose  . acetaminophen (TYLENOL) tablet 650 mg  650 mg Oral Q6H PRN Eugenie Filler, MD      . ALPRAZolam Duanne Moron) tablet 0.25 mg  0.25 mg Oral TID PRN Velna Hatchet  Talbert Forest, NP   0.25 mg at 02/03/15 1026  . alum & mag hydroxide-simeth (MAALOX/MYLANTA) 200-200-20 MG/5ML suspension 30 mL  30 mL Oral Q6H PRN Eugenie Filler, MD   30 mL at 02/03/15 1032  . antiseptic oral rinse (CPC / CETYLPYRIDINIUM CHLORIDE 0.05%) solution 7 mL  7 mL Mouth Rinse BID Thurnell Lose, MD   7 mL at 02/03/15 1000  . arformoterol (BROVANA) nebulizer solution 15 mcg  15 mcg Nebulization BID Eugenie Filler, MD    15 mcg at 02/03/15 0810  . atorvastatin (LIPITOR) tablet 10 mg  10 mg Oral q1800 Eugenie Filler, MD   10 mg at 02/03/15 1749  . bisacodyl (DULCOLAX) suppository 10 mg  10 mg Rectal Daily PRN Irine Seal V, MD      . budesonide (PULMICORT) nebulizer solution 0.5 mg  0.5 mg Nebulization BID Donita Brooks, NP   0.5 mg at 02/03/15 0810  . dextromethorphan-guaiFENesin (MUCINEX DM) 30-600 MG per 12 hr tablet 2 tablet  2 tablet Oral BID Eugenie Filler, MD   2 tablet at 02/03/15 0941  . diltiazem (CARDIZEM) tablet 60 mg  60 mg Oral 3 times per day Thurnell Lose, MD   60 mg at 02/03/15 1447  . enoxaparin (LOVENOX) injection 40 mg  40 mg Subcutaneous Q24H Thuy D Dang, RPH   40 mg at 01/31/15 2200  . feeding supplement (GLUCERNA SHAKE) (GLUCERNA SHAKE) liquid 237 mL  237 mL Oral TID BM Eugenie Filler, MD   237 mL at 02/03/15 1447  . ferrous sulfate tablet 325 mg  325 mg Oral BID WC Eugenie Filler, MD   325 mg at 02/03/15 1749  . furosemide (LASIX) tablet 20 mg  20 mg Oral Daily Eugenie Filler, MD   20 mg at 02/03/15 0941  . guaiFENesin-codeine 100-10 MG/5ML solution 10 mL  10 mL Oral Q4H PRN Thurnell Lose, MD      . insulin aspart (novoLOG) injection 0-20 Units  0-20 Units Subcutaneous TID WC Eugenie Filler, MD   4 Units at 02/03/15 1749  . insulin glargine (LANTUS) injection 20 Units  20 Units Subcutaneous Daily Thurnell Lose, MD   20 Units at 02/03/15 [phone]  . ipratropium (ATROVENT) nebulizer solution 0.5 mg  0.5 mg Nebulization Q6H Eugenie Filler, MD   0.5 mg at 02/03/15 1314  . ipratropium (ATROVENT) nebulizer solution 0.5 mg  0.5 mg Nebulization Q2H PRN Eugenie Filler, MD      . levalbuterol [facility]) nebulizer solution 0.63 mg  0.63 mg Nebulization Q6H Eugenie Filler, MD   0.63 mg at 02/03/15 1313  . levalbuterol (XOPENEX) nebulizer solution 0.63 mg  0.63 mg Nebulization Q2H PRN Eugenie Filler, MD   0.63 mg at 02/03/15 1725  . levofloxacin (LEVAQUIN) tablet 750  mg  750 mg Oral Daily Thurnell Lose, MD   750 mg at 02/03/15 1026  . losartan (COZAAR) tablet 25 mg  25 mg Oral Daily Eugenie Filler, MD   25 mg at 02/03/15 0941  . montelukast (SINGULAIR) tablet 10 mg  10 mg Oral QHS Eugenie Filler, MD   10 mg at 02/02/15 2151  . morphine 2 MG/ML injection 2 mg  2 mg Intravenous Q4H PRN Thurnell Lose, MD      . morphine CONCENTRATE 10 MG/0.5ML oral solution 10 mg  10 mg Oral TID Thurnell Lose, MD   10 mg at 02/03/15 1026  .  nitroGLYCERIN (NITROSTAT) SL tablet 0.4 mg  0.4 mg Sublingual Q5 Min x 3 PRN Eugenie Filler, MD      . ondansetron [facility]) injection 4 mg  4 mg Intravenous Q6H PRN Eugenie Filler, MD   4 mg at 02/02/15 0951  . pantoprazole (PROTONIX) EC tablet 40 mg  40 mg Oral BID Eugenie Filler, MD   40 mg at 02/03/15 [phone]  . [START ON 02/04/2015] predniSONE (DELTASONE) tablet 50 mg  50 mg Oral Q breakfast Thurnell Lose, MD      . senna-docusate (Senokot-S) tablet 1 tablet  1 tablet Oral QHS PRN Eugenie Filler, MD   1 tablet at 02/03/15 [phone]  . senna-docusate (Senokot-S) tablet 2 tablet  2 tablet Oral QHS Eugenie Filler, MD   2 tablet at 02/02/15 2151  . sodium chloride (OCEAN) 0.65 % nasal spray 1 spray  1 spray Each Nare PRN Irine Seal V, MD      . sodium chloride 0.9 % injection 3 mL  3 mL Intravenous Q12H Eugenie Filler, MD   3 mL at 02/03/15 0944  . sodium phosphate (FLEET) 7-19 GM/118ML enema 1 enema  1 enema Rectal Once PRN Eugenie Filler, MD      . sorbitol 70 % solution 30 mL  30 mL Oral Daily PRN Eugenie Filler, MD   30 mL at 02/01/15 1030     Discharge Medications: Please see discharge summary for a list of discharge medications.  Relevant Imaging Results:  Relevant Lab Results:   Additional Information SS# [id number]  Benard Halsted, LCSWA

## 2015-02-03 NOTE — Progress Notes (Signed)
Pt states she does not want to wear the bipap tonight and would prefer to try to wear her nasal cannula all night. Encouraged pt to call if she changed her mind, she communicates understanding.

## 2015-02-03 NOTE — Progress Notes (Signed)
Name: ALIERA BARBUSH MRN: [id number] DOB: 07-Jul-1945    ADMISSION DATE:  01/30/2015 CONSULTATION DATE:  01/31/15  REFERRING MD :  Dr. Candiss Norse   CHIEF COMPLAINT:  SOB    HISTORY OF PRESENT ILLNESS:  69 y/o F, former smoker, with PMH of severe O2 dependent COPD, HTN, CAD s/p MI, HLD, DM II and anemia admitted 12/25 with complaints of worsening shortness of breath.    The patient reports she was recently discharged from a SNF for rehab on 12/21.  She indicates once she got home she had difficulty walking up her two front steps and she became very short of breath with the act of getting into the house.  She was able to rehab back to walking approximately 50-60 ft with assistance.  SNF notes reflect she also had concern for GIB with hgb drop from 11 to 8.  Plavix was held and BP medications reduced (cardizem and losartan).  She called EMS the day of admit when breathing treatments did not resolve shortness of breath.  She was treated with IV steroids & nebulized bronchodilators per EMS.  She denied fevers, chills, nausea / vomiting, diarrhea, chest pain, pain with inspiration.  ABG on admission notable for mild hypercarbic respiratory failure and she was placed on bipap.  TRH admitted for further evaluation. The patient was treated empirically for AECOPD and possible HCAP with left basilar atx on CXR.  PCCM consulted for evaluation 12/26.    The patient reports she wore bipap overnight and feels better am of 12/26 but not back to baseline.  She currently denies flu-like symptoms, fevers, chills, N/V/D, chest pain.    SUBJECTIVE: Reports feeling a little better this morning enen though she still gets sob with minimal exertion. State that the higher dose of morphine started 12/28 is helping. Reports mild improvement in cough.  Denies fever, chills and hemoptysis. Seen by palliative care 12/28.  VITAL SIGNS: Temp:  [98 F (36.7 C)-98.5 F (36.9 C)] 98.4 F (36.9 C) (12/29 0400) Pulse Rate:   [79-103] 89 (12/29 0819) Resp:  [14-25] 20 (12/29 0819) BP: (122-169)/(54-75) 146/63 mmHg (12/29 0700) SpO2:  [91 %-100 %] 100 % (12/29 0819) FiO2 (%):  [40 %] 40 % (12/29 0332) Weight:  [51 kg (112 lb 7 oz)] 51 kg (112 lb 7 oz) (12/29 0500)  PHYSICAL EXAMINATION: General:  Chronically ill appearing female moderate respiratory distress Neuro:  AAOx4, less anxious, speech clear, MAE  HEENT:  MM pink/moist, no jvd  Cardiovascular:  s1s2 rrr, no m/r/g Lungs: Labored with minimal exertion, prolonged expiratory phase, airflow diminished in all lung fields, faint expiratory wheezes bilaterally Abdomen:  NTND, bsx4 active  Musculoskeletal:  No acute deformities  Skin:  Warm/dry, no peripheral edema    Recent Labs Lab 01/31/15 0245 02/01/15 0320 02/01/15 1615 02/02/15 0324  NA 139 138  --  141  K 4.3 5.4* 4.4 4.3  CL 104 101  --  96*  CO2 27 31  --  36*  BUN 18 25*  --  24*  CREATININE 0.52 0.55  --  0.51  GLUCOSE 250* 260*  --  137*    Recent Labs Lab 01/31/15 0245 02/01/15 0320 02/02/15 0324  HGB 8.7* 9.1* 9.7*  HCT 28.3* 31.1* 33.7*  WBC 22.5* 25.6* 22.0*  PLT 253 293 325   Dg Chest 2 View  02/01/2015  CLINICAL DATA:  69 year old female with shortness of breath, cough and congestion. EXAM: CHEST  2 VIEW COMPARISON:  02/01/2015 and prior  exams FINDINGS: Cardiomegaly identified. Left basilar opacity is unchanged. There is no evidence of pneumothorax, pulmonary edema or pulmonary mass. No acute bony abnormalities are identified. IMPRESSION: Unchanged appearance of the chest with continued left basilar opacity which may represent atelectasis or airspace disease/ pneumonia. Electronically Signed   By: Margarette Canada M.D.   On: 02/01/2015 11:11    CULTURES:  UC 12/25 >> negative to date BCx2 12/25 >> negative to date Sputum 12/25 >>negative to date RVP 12/25 >> negative to date  SIGNIFICANT EVENTS  12/25  Admit with increasing SOB, hypercarbic resp fx. Rx'd with bipap.   Questionable atelectasis versus left basilar infiltrate. Overall, interval improvement in respiratory status but not at baseline. This  Might be her new baseline. Patient is aware of prognosis. She is being followed by palliative care.   ASSESSMENT / PLAN:  Acute on Chronic Hypoxic / Hypercapnic Respiratory Failure Acute Exacerbation of COPD Dyspnea   Oxygen Dependent COPD  Atelectasis LLL vs Infiltrate; unchanged from 12/26 CXR  Plan: O2 for sats 88-95%, baseline 3-4L No Intubation / DNI  PRN BiPAP for increased WOB  [place],  Budesonide, ipratropium, and  xopenex nebulizer(adjust dosing) Mucinex  Solu-medrol, adjust dosing to 40 mg BID  Continue levofloxacin  vanco and zosyn discontinued CXR prn Continue low dose morphine and stagger with prn xanax to avoid oversedation.      Anxiety   Plan: Adjust xanax to home dosing of 0.25 TID PRN and morphine prn  Rest of treatment plan per primary team  Total time 20 minutes   Magdalene S. [facility] ANP-BC Pulmonary and Critical Care Medicine [facility] Pager: [phone]  02/03/2015, 9:21 AM   Attending:  I have seen and examined the patient with nurse practitioner/resident and agree with the note above.  My edits are in BOLD above.  We formulated the plan together and I elicited the following history.    Ms. Shafiq is a little better today, but still sleepy somewhat  Lungs mild wheezing but better air movement  COPD: would maintain this nebulizer regimen.  As she has been prednisone dependent for most of this year, would plan to slowly wean her prednisone down to 30mg  daily and maintain at that level indefinitely.  I explained to her today that I think she needs to go to inpatient hospice. I updated her son [name] yesterday  PCCM will sign off, call if questions  Roselie Awkward, MD [place] PCCM Pager: [phone] Cell: [phone] After 3pm or if no response, call [phone]

## 2015-02-03 NOTE — Progress Notes (Signed)
Pharmacy Antibiotic Follow-up Note  Judith Boyer is a 69 y.o. year-old female admitted on 01/30/2015.  The patient is currently on day 5 of vancomycin and zosyn for suspected pnemonia  Assessment/Plan: After discussion with Dr. Candiss Norse, all antibiotics will be discontinued because this patient has completed a total of 5 days of therapy and all cultures are negative.    Temp (24hrs), Avg:98.3 F (36.8 C), Min:98 F (36.7 C), Max:98.5 F (36.9 C)   Recent Labs Lab 01/30/15 0918 01/31/15 0245 02/01/15 0320 02/02/15 0324  WBC 27.7* 22.5* 25.6* 22.0*    Recent Labs Lab 01/30/15 0918 01/31/15 0245 02/01/15 0320 02/02/15 0324  CREATININE 0.37* 0.52 0.55 0.51   Estimated Creatinine Clearance: 49.4 mL/min (by C-G formula based on Cr of 0.51).    Allergies  Allergen Reactions  . Doxycycline Anaphylaxis  . Codeine Other (See Comments)    hyperactivity  . Dulera [Mometasone Furo-Formoterol Fum] Hives and Rash  . Adhesive [Tape] Rash  . Latex Rash  . Lisinopril Cough    Antimicrobials this admission: Vanc 12/25 >> Zosyn 12/25 >> Tamiflu 12/25 >> 12/26  Levels/dose changes this admission: None  Microbiology results: 12/25 BCx x2 - NGTD 12/25 RVP - 12/25 UCx - negative 12/26 sputum - negative  Thank you for allowing pharmacy to be a part of this patient's care.  Levester Fresh, PharmD, BCPS, [facility] Clinical Pharmacist Pager [phone] 02/03/2015 9:48 AM

## 2015-02-03 NOTE — Progress Notes (Signed)
Patient Demographics:    Judith Boyer, is a 69 y.o. female, DOB - [birth year], [id number]  Admit date - 01/30/2015   Admitting Physician Eugenie Filler, MD  Outpatient Primary MD for the patient is DAUB, [name], MD  LOS - 4   Chief Complaint  Patient presents with  . Shortness of Breath        Subjective:    Judith Boyer today has, No headache, No chest pain, No abdominal pain - No Nausea, No new weakness tingling or numbness, continues to have cough with shortness of breath overall mildly improved.   Assessment  & Plan :     1. Acute on chronic hypoxic and hypercapnic respiratory failure due to COPD exacerbation in the setting of HCAP. She has advanced COPD, currently on empiric IV antibiotics with HCAP along with IV steroids, on nebulizer treatments, needed BiPAP initially. Overall mildly improved but appears to have extremely advanced underlying COPD.   Discussed with the patient she does not want to get intubated and I told her that her prognosis appears poor. Pulmonary has been consulted, I have also consulted Pall Care to discuss goals of care as her long-term prognosis appears poor. She will likely go to SNF with comfort care versus residential hospice based on the decision made today.   2. Severe protein calorie malnutrition. On nutritional supplements.   3. Dyslipidemia. On statin.   4. GERD. PPI continued.   5. Essential hypertension. On ARB continue for now.   6. History of CAD. On statin, as pain-free, EKG with nonspecific changes. Supportive care.   7. DM type II. Continue sliding scale for now, add low-dose Lantus for better control while she is on steroids  Lab Results  Component Value Date   HGBA1C 7.2* 01/30/2015    CBG (last 3)   Recent Labs  02/02/15 1618 02/02/15 2143 02/03/15 0817  GLUCAP 91 209* 222*      Code Status : No intubation prognosis is guarded  Family Communication  : None present  Disposition Plan  : SNF with palliative care versus residential hospice on 02/04/2015  Consults  :  PCCM, palliative care  Procedures  :    DVT Prophylaxis  :  Lovenox   Lab Results  Component Value Date   PLT 325 02/02/2015    Inpatient Medications  Scheduled Meds: . antiseptic oral rinse  7 mL Mouth Rinse BID  . arformoterol  15 mcg Nebulization BID  . atorvastatin  10 mg Oral q1800  . budesonide (PULMICORT) nebulizer solution  0.5 mg Nebulization BID  . dextromethorphan-guaiFENesin  2 tablet Oral BID  . diltiazem  60 mg Oral 3 times per day  . enoxaparin (LOVENOX) injection  40 mg Subcutaneous Q24H  . feeding supplement (GLUCERNA SHAKE)  237 mL Oral TID BM  . ferrous sulfate  325 mg Oral BID WC  . furosemide  20 mg Oral Daily  . insulin aspart  0-20 Units Subcutaneous TID WC  . insulin glargine  20 Units Subcutaneous Daily  . ipratropium  0.5 mg Nebulization Q6H  . levalbuterol  0.63 mg Nebulization Q6H  . levofloxacin  750 mg Oral Daily  . losartan  25 mg Oral Daily  . montelukast  10 mg Oral  QHS  . morphine CONCENTRATE  10 mg Oral TID  . pantoprazole  40 mg Oral BID  . [START ON 02/04/2015] predniSONE  50 mg Oral Q breakfast  . senna-docusate  2 tablet Oral QHS  . sodium chloride  3 mL Intravenous Q12H   Continuous Infusions:   PRN Meds:.acetaminophen **OR** [DISCONTINUED] acetaminophen, ALPRAZolam, alum & mag hydroxide-simeth, bisacodyl, guaiFENesin-codeine, ipratropium, levalbuterol, morphine injection, nitroGLYCERIN, [DISCONTINUED] ondansetron **OR** ondansetron (ZOFRAN) IV, senna-docusate, sodium chloride, sodium phosphate, sorbitol  Antibiotics  :     Anti-infectives    Start     Dose/Rate Route Frequency Ordered Stop   02/03/15 1000  levofloxacin (LEVAQUIN) tablet 750 mg     750 mg Oral Daily  02/03/15 0942     01/30/15 1430  oseltamivir (TAMIFLU) capsule 30 mg  Status:  Discontinued     30 mg Oral 2 times daily 01/30/15 1326 01/31/15 1013   01/30/15 1400  ceFEPIme (MAXIPIME) 1 g in dextrose 5 % 50 mL IVPB  Status:  Discontinued     1 g 100 mL/hr over 30 Minutes Intravenous 3 times per day 01/30/15 1211 01/30/15 1211   01/30/15 1330  oseltamivir (TAMIFLU) capsule 75 mg  Status:  Discontinued     75 mg Oral 2 times daily 01/30/15 1321 01/30/15 1325   01/30/15 1200  piperacillin-tazobactam (ZOSYN) IVPB 3.375 g  Status:  Discontinued     3.375 g 12.5 mL/hr over 240 Minutes Intravenous Every 8 hours 01/30/15 1054 02/03/15 0942   01/30/15 1200  vancomycin (VANCOCIN) IVPB 1000 mg/200 mL premix  Status:  Discontinued     1,000 mg 200 mL/hr over 60 Minutes Intravenous Every 24 hours 01/30/15 1054 02/03/15 0942        Objective:   Filed Vitals:   02/03/15 0700 02/03/15 0800 02/03/15 0817 02/03/15 0819  BP: 146/63 145/64    Pulse:  97 88 89  Temp:  97.9 F (36.6 C)    TempSrc:  Oral    Resp: 15 21 17 20   Height:      Weight:      SpO2:   97% 100%    Wt Readings from Last 3 Encounters:  02/03/15 51 kg (112 lb 7 oz)  01/24/15 48.988 kg (108 lb)  12/21/14 44.7 kg (98 lb 8.7 oz)     Intake/Output Summary (Last 24 hours) at 02/03/15 1107 Last data filed at 02/03/15 0103  Gross per 24 hour  Intake    480 ml  Output   1350 ml  Net   -870 ml     Physical Exam  Awake Alert, Oriented X 3, No new F.N deficits, Normal affect [place].AT,PERRAL Supple Neck,No JVD, No cervical lymphadenopathy appriciated.  Symmetrical Chest wall movement, minimal air movement bilaterally, exp wheezes, coarse B sounds RRR,No Gallops,Rubs or new Murmurs, No Parasternal Heave +ve B.Sounds, Abd Soft, No tenderness, No organomegaly appriciated, No rebound - guarding or rigidity. No Cyanosis, Clubbing or edema, No new Rash or bruise       Data Review:   Micro Results Recent Results (from the past  240 hour(s))  Culture, Urine     Status: None   Collection Time: 01/30/15  3:47 PM  Result Value Ref Range Status   Specimen Description URINE, RANDOM  Final   Special Requests ADDED 2223  Final   Culture NO GROWTH 1 DAY  Final   Report Status 01/31/2015 FINAL  Final  Culture, blood (routine x 2) Call MD if unable to obtain prior to  antibiotics being given     Status: None (Preliminary result)   Collection Time: 01/30/15  4:00 PM  Result Value Ref Range Status   Specimen Description BLOOD LEFT ANTECUBITAL  Final   Special Requests IN PEDIATRIC BOTTLE [place]  Final   Culture NO GROWTH 4 DAYS  Final   Report Status PENDING  Incomplete  Culture, blood (routine x 2) Call MD if unable to obtain prior to antibiotics being given     Status: None (Preliminary result)   Collection Time: 01/30/15  4:08 PM  Result Value Ref Range Status   Specimen Description BLOOD RIGHT HAND  Final   Special Requests IN PEDIATRIC BOTTLE [place]  Final   Culture NO GROWTH 4 DAYS  Final   Report Status PENDING  Incomplete  MRSA PCR Screening     Status: None   Collection Time: 01/30/15  5:55 PM  Result Value Ref Range Status   MRSA by PCR NEGATIVE NEGATIVE Final    Comment:        The GeneXpert MRSA Assay (FDA approved for NASAL specimens only), is one component of a comprehensive MRSA colonization surveillance program. It is not intended to diagnose MRSA infection nor to guide or monitor treatment for MRSA infections.   Culture, sputum-assessment     Status: None   Collection Time: 01/31/15  7:33 AM  Result Value Ref Range Status   Specimen Description SPUTUM  Final   Special Requests NONE  Final   Sputum evaluation   Final    MICROSCOPIC FINDINGS SUGGEST THAT THIS SPECIMEN IS NOT REPRESENTATIVE OF LOWER RESPIRATORY SECRETIONS. PLEASE RECOLLECT. Gram Stain Report Called to,Read Back By and Verified With: Tonita Cong AT [id number] 01/31/15 BY L BENFIELD    Report Status 01/31/2015 FINAL  Final     Radiology Reports Dg Chest 2 View  02/01/2015  CLINICAL DATA:  69 year old female with shortness of breath, cough and congestion. EXAM: CHEST  2 VIEW COMPARISON:  02/01/2015 and prior exams FINDINGS: Cardiomegaly identified. Left basilar opacity is unchanged. There is no evidence of pneumothorax, pulmonary edema or pulmonary mass. No acute bony abnormalities are identified. IMPRESSION: Unchanged appearance of the chest with continued left basilar opacity which may represent atelectasis or airspace disease/ pneumonia. Electronically Signed   By: Margarette Canada M.D.   On: 02/01/2015 11:11   Dg Chest Port 1 View  02/01/2015  CLINICAL DATA:  Shortness of breath. EXAM: PORTABLE CHEST 1 VIEW COMPARISON:  01/30/2015. FINDINGS: Mediastinum hilar structures normal. Cardiomegaly with pulmonary vascular congestion and prominent interstitial changes consistent with congestive heart failure. Bibasilar atelectasis and/or pneumonia noted. Small left pleural effusion cannot be excluded. No pneumothorax. IMPRESSION: 1. Cardiomegaly with mild pulmonary venous congestion interstitial prominence suggesting congestive heart failure. Small left pleural effusion cannot be excluded . 2. Bibasilar atelectasis and/or infiltrates again noted. Electronically Signed   By: Marcello Moores  Register   On: 02/01/2015 07:16   Dg Chest Portable 1 View  01/30/2015  CLINICAL DATA:  Worsening shortness of Breath for couple of days, history of COPD EXAM: PORTABLE CHEST 1 VIEW COMPARISON:  12/25/2014 FINDINGS: Cardiomediastinal silhouette is stable. Mild hyperinflation. There is patchy left basilar atelectasis or early infiltrate. No pulmonary edema. Atherosclerotic calcifications of thoracic aorta again noted. IMPRESSION: Mild hyperinflation again noted. Patchy left basilar atelectasis or early infiltrate. No pulmonary edema. Electronically Signed   By: Judith Boyer M.D.   On: 01/30/2015 10:14     CBC  Recent Labs Lab 01/30/15 [id number]  01/31/15 0245 02/01/15 0320  02/02/15 0324  WBC 27.7* 22.5* 25.6* 22.0*  HGB 10.8* 8.7* 9.1* 9.7*  HCT 36.0 28.3* 31.1* 33.7*  PLT 349 253 293 325  MCV 97.0 96.6 97.8 99.1  MCH 29.1 29.7 28.6 28.5  MCHC 30.0 30.7 29.3* 28.8*  RDW 15.2 15.0 15.0 15.0  LYMPHSABS 0.8  --   --   --   MONOABS 2.2*  --   --   --   EOSABS 0.0  --   --   --   BASOSABS 0.0  --   --   --     Chemistries   Recent Labs Lab 01/30/15 0918 01/30/15 1600 01/31/15 0245 02/01/15 0320 02/01/15 1615 02/02/15 0324  NA 136  --  139 138  --  141  K 4.1  --  4.3 5.4* 4.4 4.3  CL 96*  --  104 101  --  96*  CO2 30  --  27 31  --  36*  GLUCOSE 298*  --  250* 260*  --  137*  BUN 9  --  18 25*  --  24*  CREATININE 0.37*  --  0.52 0.55  --  0.51  CALCIUM 8.6*  --  8.2* 8.6*  --  8.7*  MG  --  1.9  --  2.1  --   --   AST  --   --  19  --   --   --   ALT  --   --  21  --   --   --   ALKPHOS  --   --  57  --   --   --   BILITOT  --   --  0.6  --   --   --    ------------------------------------------------------------------------------------------------------------------ estimated creatinine clearance is 49.4 mL/min (by C-G formula based on Cr of 0.51). ------------------------------------------------------------------------------------------------------------------ No results for input(s): HGBA1C in the last 72 hours. ------------------------------------------------------------------------------------------------------------------ No results for input(s): CHOL, HDL, LDLCALC, TRIG, CHOLHDL, LDLDIRECT in the last 72 hours. ------------------------------------------------------------------------------------------------------------------ No results for input(s): TSH, T4TOTAL, T3FREE, THYROIDAB in the last 72 hours.  Invalid input(s): FREET3 ------------------------------------------------------------------------------------------------------------------ No results for input(s): VITAMINB12, FOLATE, FERRITIN, TIBC,  IRON, RETICCTPCT in the last 72 hours.  Coagulation profile No results for input(s): INR, PROTIME in the last 168 hours.  No results for input(s): DDIMER in the last 72 hours.  Cardiac Enzymes No results for input(s): CKMB, TROPONINI, MYOGLOBIN in the last 168 hours.  Invalid input(s): CK ------------------------------------------------------------------------------------------------------------------ Invalid input(s): POCBNP   Time Spent in minutes   35   SINGH,PRASHANT K M.D on 02/03/2015 at 11:07 AM  Between 7am to 7pm - Pager - [phone]  After 7pm go to [email or web address] - password [facility]  [facility] -  Office  [phone]

## 2015-02-03 NOTE — Progress Notes (Addendum)
Daily Progress Note   Patient Name: Judith Boyer       Date: 02/03/2015 DOB: 1945-04-27  Age: 69 y.o. MRN#: [id number] Attending Physician: Thurnell Lose, MD Primary Care Physician: Jenny Reichmann, MD Admit Date: 01/30/2015  Reason for Consultation/Follow-up: Establishing goals of care  Subjective: I met again with [name]. She is able to hold a conversation today with slightly more ease than yesterday and sitting up in recliner. She says she is feeling a little better today and slept better last night until about 3 am. She says that the BiPAP helps her - would continue qhs scheduled. Discussed with Dr. Candiss Norse and agree with scheduled morphine to assist with SOB and distress with prn morphine. She has been a little resistant to the morphine but says she is open to anything that will help her - she will not ask for this herself and understands the benefits vs risks or these medications. I tried to readdress code status but she is more concerned on where she goes from the hospital. She does not feel like she is ready for hospice facility as "I don't want to just give completely up." Concerned that she needs more assistance than is available at her home. She does not wish to go to SNF and I fear she could not tolerate rehab so payment may be an issue with this option (did not discuss this with her).   We have decided to continue to morphine and BiPAP and see if she is able to even go back and forth to [facility] more independently. Ideally would have more support at home and have hospice - she is open to hospice at home but knows they are not there 24 hrs a day. Encouraged her to think about this today and to consider allowing me to get her family together to brainstorm options and see if home is a  realistic option. She has been resistant to me contacting her family so far. I will follow up tomorrow.    Length of Stay: 4 days  Current Medications: Scheduled Meds:  . antiseptic oral rinse  7 mL Mouth Rinse BID  . arformoterol  15 mcg Nebulization BID  . atorvastatin  10 mg Oral q1800  . budesonide (PULMICORT) nebulizer solution  0.5 mg Nebulization BID  . dextromethorphan-guaiFENesin  2 tablet Oral BID  . diltiazem  60 mg Oral 3 times per day  . enoxaparin (LOVENOX) injection  40 mg Subcutaneous Q24H  . feeding supplement (GLUCERNA SHAKE)  237 mL Oral TID BM  . ferrous sulfate  325 mg Oral BID WC  . furosemide  20 mg Oral Daily  . insulin aspart  0-20 Units Subcutaneous TID WC  . insulin glargine  20 Units Subcutaneous Daily  . ipratropium  0.5 mg Nebulization Q6H  . levalbuterol  0.63 mg Nebulization Q6H  . levofloxacin  750 mg Oral Daily  . losartan  25 mg Oral Daily  . montelukast  10 mg Oral QHS  . morphine CONCENTRATE  10 mg Oral TID  . pantoprazole  40 mg Oral BID  . [START ON 02/04/2015] predniSONE  50 mg Oral Q breakfast  . senna-docusate  2 tablet Oral QHS  . sodium chloride  3 mL Intravenous Q12H    Continuous Infusions:    PRN Meds: acetaminophen **OR** [DISCONTINUED] acetaminophen, ALPRAZolam, alum & mag hydroxide-simeth, bisacodyl, guaiFENesin-codeine, ipratropium, levalbuterol, morphine injection, nitroGLYCERIN, [DISCONTINUED] ondansetron **OR** ondansetron (ZOFRAN) IV, senna-docusate, sodium chloride, sodium phosphate, sorbitol  Physical Exam: Physical Exam  Constitutional: She is oriented to person, place, and time. She appears well-developed and well-nourished.  HENT:  Head: Normocephalic and atraumatic.  Cardiovascular: Normal rate and regular rhythm.   Pulmonary/Chest: No tachypnea.  Less distressed today but still very labored  Abdominal: Normal appearance.  Neurological: She is alert and oriented to person, place, and time.                 Vital Signs: BP 145/64 mmHg  Pulse 89  Temp(Src) 97.9 F (36.6 C) (Oral)  Resp 20  Ht '5\' 1"'  (1.549 m)  Wt 51 kg (112 lb 7 oz)  BMI 21.26 kg/m2  SpO2 100% SpO2: SpO2: 100 % O2 Device: O2 Device: Nasal Cannula O2 Flow Rate: O2 Flow Rate (L/min): 4 L/min  Intake/output summary:  Intake/Output Summary (Last 24 hours) at 02/03/15 1143 Last data filed at 02/03/15 0103  Gross per 24 hour  Intake    480 ml  Output   1350 ml  Net   -870 ml   LBM: Last BM Date: 01/28/15 (On Senokot-S) Baseline Weight: Weight: 48.081 kg (106 lb) Most recent weight: Weight: 51 kg (112 lb 7 oz)       Palliative Assessment/Data:   Additional Data Reviewed: CBC    Component Value Date/Time   WBC 22.0* 02/02/2015 0324   WBC 10.0 01/19/2015   WBC 10.4* 05/01/2014 1253   RBC 3.40* 02/02/2015 0324   RBC 4.45 05/01/2014 1253   RBC 4.59 04/18/2013 1109   HGB 9.7* 02/02/2015 0324   HGB 12.7 05/01/2014 1253   HCT 33.7* 02/02/2015 0324   HCT 41.6 05/01/2014 1253   PLT 325 02/02/2015 0324   MCV 99.1 02/02/2015 0324   MCV 93.4 05/01/2014 1253   MCH 28.5 02/02/2015 0324   MCH 28.6 05/01/2014 1253   MCHC 28.8* 02/02/2015 0324   MCHC 30.6* 05/01/2014 1253   RDW 15.0 02/02/2015 0324   LYMPHSABS 0.8 01/30/2015 0918   MONOABS 2.2* 01/30/2015 0918   EOSABS 0.0 01/30/2015 0918   BASOSABS 0.0 01/30/2015 0918    CMP     Component Value Date/Time   NA 141 02/02/2015 0324   NA 144 01/10/2015   K 4.3 02/02/2015 0324   CL 96* 02/02/2015 0324   CO2 36* 02/02/2015 0324   GLUCOSE 137* 02/02/2015 0324  BUN 24* 02/02/2015 0324   BUN 15 01/10/2015   CREATININE 0.51 02/02/2015 0324   CREATININE 0.3* 01/10/2015   CREATININE 0.46* 11/18/2014 1153   CALCIUM 8.7* 02/02/2015 0324   PROT 5.0* 01/31/2015 0245   ALBUMIN 2.7* 01/31/2015 0245   AST 19 01/31/2015 0245   ALT 21 01/31/2015 0245   ALKPHOS 57 01/31/2015 0245   BILITOT 0.6 01/31/2015 0245   GFRNONAA >60 02/02/2015 0324   GFRNONAA >89 11/18/2014  1153   GFRAA >60 02/02/2015 0324   GFRAA >89 11/18/2014 1153       Problem List:  Patient Active Problem List   Diagnosis Date Noted  . SOB (shortness of breath)   . Acute respiratory failure ([place]) 01/30/2015  . HCAP (healthcare-associated pneumonia) 01/30/2015  . DNR (do not resuscitate) discussion 12/29/2014  . Acute on chronic respiratory failure with hypoxia and hypercapnia (HCC)   . Hyperkalemia 12/25/2014  . Tobacco abuse   . Noncompliance   . Acute on chronic respiratory failure with hypoxia ([place])   . Acute exacerbation of chronic obstructive pulmonary disease (COPD) ([place])   . Diabetes mellitus with complication ([place])   . Chronic respiratory failure with hypercapnia ([place]) 09/24/2014  . Palliative care by specialist   . Dysphagia   . Dyspnea 08/13/2014  . Tachycardia   . Respiratory acidosis   . Coronary artery disease involving native coronary artery of native heart without angina pectoris   . Anxiety   . B12 deficiency 02/10/2014  . Protein-calorie malnutrition, severe ([place]) 02/09/2014  . Acute respiratory distress (HCC) 02/08/2014  . Vitamin B 12 deficiency 06/28/2013  . Palliative care encounter 02/25/2013  . Weakness generalized 02/25/2013  . COPD exacerbation ([place]) 02/20/2013  . Anemia 02/20/2013  . Nocturnal hypoxemia 12/30/2012  . COPD GOLD III 02/06/2011  . Smoker 02/06/2011  . Hypertension 02/06/2011  . Diabetes mellitus type 2, controlled, without complications ([place]) 16/11/9602     Palliative Care Assessment & Plan    1.Code Status:  Limited code    Code Status Orders        Start     Ordered   01/30/15 1322  Limited resuscitation (code)   Continuous    Question Answer Comment  In the event of cardiac or respiratory ARREST: Initiate Code Blue, Call Rapid Response Yes   In the event of cardiac or respiratory ARREST: Perform CPR Yes   In the event of cardiac or respiratory ARREST: Perform Intubation/Mechanical Ventilation No   In the  event of cardiac or respiratory ARREST: Use NIPPV/BiPAp only if indicated Yes   In the event of cardiac or respiratory ARREST: Administer ACLS medications if indicated Yes   In the event of cardiac or respiratory ARREST: Perform Defibrillation or Cardioversion if indicated Yes      01/30/15 1321       2. Goals of Care/Additional Recommendations:  She would like relief of SOB and we are working on this. Does not want to feel like she is "just giving up." Wants to continue supportive care with hope of having some more time.   Desire for further Chaplaincy support:no  Psycho-social Needs: Caregiving  Support/Resources and Education on Hospice  3. Symptom Management:  Continue Xanax 0.25 mg TID prn. May need increased dose at night as this does not help her sleep long enough.   SOB/dyspnea: Tolerating roxanol 5 mg with still limited benefit. Agree with scheduled roxanol 10 mg TID and will reassess effectiveness and tolerance tomorrow. Continue roxanol prn. Will use BiPAP  overnight.   4. Palliative Prophylaxis:   Bowel Regimen and Delirium Protocol  5. Prognosis: < 6 months definitely. Could be weeks looking at her as she is struggling with each breathe.   6. Discharge Planning:  To be determined.    Thank you for allowing the Palliative Medicine Team to assist in the care of this patient.   Time In: 1100 Time Out: 1140 Total Time 71mn Prolonged Time Billed  no         APershing Proud NP  146/28/6381 11:43 AM  Please contact Palliative Medicine Team phone at [phone] questions and concerns.

## 2015-02-03 NOTE — Progress Notes (Addendum)
Physical Therapy Treatment Patient Details Name: Judith Boyer MRN: [id number] DOB: 1945/11/01 Today's Date: 02/03/2015    History of Present Illness 69 yo female admitted with COPD exacerbation. Pt currently with PNA. PMH: HTN, COPD on 3 L at home, DM, CAD Anemia    PT Comments    Pt admitted with above diagnosis. Pt currently with functional limitations due to balance and endurance deficits. Pt was able to ambulate around bed and was exhausted.  Performed a little exercise.  Poor reserve.   Changed pt frequency to 2X week as pt is for SNF and this is appropriate frequency per departmental policy.  Pt will benefit from skilled PT to increase their independence and safety with mobility to allow discharge to the venue listed below.    Follow Up Recommendations  SNF;Supervision/Assistance - 24 hour     Equipment Recommendations  None recommended by PT    Recommendations for Other Services       Precautions / Restrictions Precautions Precautions: Fall;Other (comment) (oxygen) Restrictions Weight Bearing Restrictions: No    Mobility  Bed Mobility Overal bed mobility: Modified Independent             General bed mobility comments: using rails and HOB elevated  Transfers Overall transfer level: Needs assistance Equipment used: Rolling walker (2 wheeled) Transfers: Sit to/from Stand Sit to Stand: Min assist         General transfer comment: needed assist to power up and steadying assist once up.    Ambulation/Gait Ambulation/Gait assistance: Min assist Ambulation Distance (Feet): 25 Feet Assistive device: Rolling walker (2 wheeled) Gait Pattern/deviations: Step-through pattern;Decreased stride length;Decreased step length - right;Decreased step length - left;Drifts right/left;Trunk flexed   Gait velocity interpretation: Below normal speed for age/gender General Gait Details: Pt ambulated from bed to recliner on other side of bed.  Pt needed cues for pursed lip  breathing and steading assist throughout.  Pt needs min assist when she begins to fatigue with incr cues and steadying assist for postural stability as she gets anxious when she fatigues too decreasing her safety awareness.     Stairs            Wheelchair Mobility    Modified Rankin (Stroke Patients Only)       Balance Overall balance assessment: Needs assistance;History of Falls Sitting-balance support: No upper extremity supported;Feet supported Sitting balance-Leahy Scale: Fair     Standing balance support: Bilateral upper extremity supported;During functional activity Standing balance-Leahy Scale: Poor Standing balance comment: relies on UEs for balance.                     Cognition Arousal/Alertness: Awake/alert Behavior During Therapy: WFL for tasks assessed/performed Overall Cognitive Status: Within Functional Limits for tasks assessed                      Exercises General Exercises - Lower Extremity Ankle Circles/Pumps: AROM;Both;10 reps;Seated Long Arc Quad: AROM;Both;10 reps;Seated    General Comments        Pertinent Vitals/Pain Pain Assessment: No/denies pain  Desat to 88% with standing with 4LO2.  Able to get sats to 93% with pursed lip breathing and ambulate around bed with sats 90-94%.  Other VSS.    Home Living                      Prior Function            PT Goals (current goals can  now be found in the care plan section) Progress towards PT goals: Progressing toward goals    Frequency  Min 2X/week    PT Plan Frequency needs to be updated    Co-evaluation             End of Session Equipment Utilized During Treatment: Gait belt;Oxygen Activity Tolerance: Patient limited by fatigue Patient left: in chair;with call bell/phone within reach;with chair alarm set     Time: 1005-1017 PT Time Calculation (min) (ACUTE ONLY): 12 min  Charges:  $Gait Training: 8-22 mins                    G Codes:       WhiteGodfrey Pick 13-Feb-2015, 1:45 PM [facility] [facility] [phone] (pager)

## 2015-02-04 ENCOUNTER — Telehealth: Payer: Self-pay

## 2015-02-04 LAB — GLUCOSE, CAPILLARY
GLUCOSE-CAPILLARY: 117 mg/dL — AB (ref 65–99)
GLUCOSE-CAPILLARY: 227 mg/dL — AB (ref 65–99)
GLUCOSE-CAPILLARY: 248 mg/dL — AB (ref 65–99)

## 2015-02-04 LAB — CULTURE, BLOOD (ROUTINE X 2)
CULTURE: NO GROWTH
Culture: NO GROWTH

## 2015-02-04 MED ORDER — LEVALBUTEROL HCL 0.63 MG/3ML IN NEBU
0.6300 mg | INHALATION_SOLUTION | Freq: Three times a day (TID) | RESPIRATORY_TRACT | Status: DC
  Administered 2015-02-05: 0.63 mg via RESPIRATORY_TRACT
  Filled 2015-02-04: qty 3

## 2015-02-04 MED ORDER — ONDANSETRON 4 MG PO TBDP
4.0000 mg | ORAL_TABLET | Freq: Three times a day (TID) | ORAL | Status: DC
  Administered 2015-02-04 – 2015-02-05 (×3): 4 mg via ORAL
  Filled 2015-02-04 (×6): qty 1

## 2015-02-04 MED ORDER — PROCHLORPERAZINE EDISYLATE 5 MG/ML IJ SOLN
10.0000 mg | Freq: Four times a day (QID) | INTRAMUSCULAR | Status: DC | PRN
  Filled 2015-02-04: qty 2

## 2015-02-04 MED ORDER — MORPHINE SULFATE (CONCENTRATE) 10 MG/0.5ML PO SOLN
5.0000 mg | Freq: Three times a day (TID) | ORAL | Status: DC
  Administered 2015-02-04 – 2015-02-05 (×3): 5 mg via ORAL
  Filled 2015-02-04 (×3): qty 0.5

## 2015-02-04 MED ORDER — IPRATROPIUM BROMIDE 0.02 % IN SOLN
0.5000 mg | Freq: Three times a day (TID) | RESPIRATORY_TRACT | Status: DC
  Administered 2015-02-05: 0.5 mg via RESPIRATORY_TRACT
  Filled 2015-02-04: qty 2.5

## 2015-02-04 NOTE — Care Management Note (Signed)
Case Management Note  Patient Details  Name: DALEYSA KRISTIANSEN MRN: [id number] Date of Birth: 1945/09/22  Subjective/Objective:                  Patient transferred from ICU. Patient has met with palliative care, does  Not "want to give up," is a partial code.    Action/Plan:  Plan at discharge is for SNF.  Expected Discharge Date:  02/01/15               Expected Discharge Plan:  Skilled Nursing Facility  In-House Referral:  Clinical Social Work  Discharge planning Services  CM Consult  Post Acute Care Choice:    Choice offered to:     DME Arranged:    DME Agency:     HH Arranged:    [place] Agency:     Status of Service:  In process, will continue to follow  Medicare Important Message Given:  Yes Date Medicare IM Given:    Medicare IM give by:    Date Additional Medicare IM Given:    Additional Medicare Important Message give by:     If discussed at [place] of Stay Meetings, dates discussed:    Additional Comments:  Carles Collet, RN 02/04/2015, 9:04 AM

## 2015-02-04 NOTE — Progress Notes (Signed)
CSW consulted regarding SNF placement. Patient has used 27 of her medicare days at [facility]. Patient stated she could not afford to pay copays at a SNF.   Per MD, patient's prognosis is months to live, therefore patient does not yet qualify for residential hospice. Patient can go home w/ hospice services, where the hospice social worker will be able to transition patient to residential hospice at the appropriate time.  CSW signing off.  Percell Locus Kamari Bilek LCSWA [phone]

## 2015-02-04 NOTE — Progress Notes (Signed)
Daily Progress Note   Patient Name: Judith Boyer       Date: 02/04/2015 DOB: 1945-11-16  Age: 69 y.o. MRN#: [id number] Attending Physician: Thurnell Lose, MD Primary Care Physician: Jenny Reichmann, MD Admit Date: 01/30/2015  Reason for Consultation/Follow-up: Establishing goals of care  Subjective: I met again with [name]. She is frustrated and stressed thinking about where she is going to go from the hospital. She knows she is dying and is struggling with this idea. We discuss disposition options such as home with hospice (no support - home is NOT an option), SNF (unable to rehab, cannot affford), and hospice facility. I believe that she is appropriate and may only have weeks left and I shared this with her. When I asked what her gut was telling her about her prognosis she tells me " that I'm tired and my body is saying let go." She knows she is dying and is only concerned that she has the help she needs, help to not be so miserable as she is suffering, and wants to make sure that she can have her breathing treatments/oxygen/medications.   We talked more about hospice facility and she tells me that her mother died at [facility] so she is familiar with this level of care. We discussed code status and DNR is confirmed today. She would like to go to [facility] if they will give her breathing treatments/oxygen/medications and focus on comfort at end of life. Still having very labored breathing and she says the morphine makes her nauseated and not able to eat - she only ate a couple bites of her breakfast and lunch today. I think this is also d/t her respiratory distress. I do feel she likely has weeks left.    Length of Stay: 5 days  Current Medications: Scheduled Meds:  .  antiseptic oral rinse  7 mL Mouth Rinse BID  . arformoterol  15 mcg Nebulization BID  . atorvastatin  10 mg Oral q1800  . budesonide (PULMICORT) nebulizer solution  0.5 mg Nebulization BID  . dextromethorphan-guaiFENesin  2 tablet Oral BID  . diltiazem  60 mg Oral 3 times per day  . enoxaparin (LOVENOX) injection  40 mg Subcutaneous Q24H  . feeding supplement (GLUCERNA SHAKE)  237 mL Oral TID BM  . ferrous sulfate  325 mg Oral BID WC  . furosemide  20 mg Oral Daily  . insulin aspart  0-20 Units Subcutaneous TID WC  . insulin glargine  20 Units Subcutaneous Daily  . ipratropium  0.5 mg Nebulization Q6H  . levalbuterol  0.63 mg Nebulization Q6H  . levofloxacin  750 mg Oral Daily  . losartan  25 mg Oral Daily  . montelukast  10 mg Oral QHS  . morphine CONCENTRATE  10 mg Oral TID  . pantoprazole  40 mg Oral BID  . predniSONE  50 mg Oral Q breakfast  . senna-docusate  2 tablet Oral QHS  . sodium chloride  3 mL Intravenous Q12H    Continuous Infusions:    PRN Meds: acetaminophen **OR** [DISCONTINUED] acetaminophen, ALPRAZolam, alum & mag hydroxide-simeth, bisacodyl, guaiFENesin-codeine, ipratropium, levalbuterol, morphine injection, nitroGLYCERIN, [DISCONTINUED] ondansetron **OR** ondansetron (ZOFRAN) IV, senna-docusate, sodium chloride, sodium phosphate, sorbitol  Physical Exam: Physical Exam  Constitutional: She is oriented to person, place, and time. She appears well-developed and well-nourished.  HENT:  Head: Normocephalic and atraumatic.  Cardiovascular: Normal rate and regular rhythm.   Pulmonary/Chest: No tachypnea.  Less distressed today but still very labored  Abdominal: Normal appearance.  Neurological: She is alert and oriented to person, place, and time.                Vital Signs: BP 124/59 mmHg  Pulse 99  Temp(Src) 98.4 F (36.9 C) (Oral)  Resp 22  Ht _0  (1.549 m)  Wt 50.259 kg (110 lb 12.8 oz)  BMI 20.95 kg/m2  SpO2 97% SpO2: SpO2: 97 % O2 Device: O2  Device: Nasal Cannula O2 Flow Rate: O2 Flow Rate (L/min): 4 L/min  Intake/output summary:   Intake/Output Summary (Last 24 hours) at 02/04/15 1401 Last data filed at 02/04/15 1223  Gross per 24 hour  Intake    597 ml  Output   1850 ml  Net  -1253 ml   LBM: Last BM Date: 02/03/15 Baseline Weight: Weight: 48.081 kg (106 lb) Most recent weight: Weight: 50.259 kg (110 lb 12.8 oz)       Palliative Assessment/Data:   Additional Data Reviewed: CBC    Component Value Date/Time   WBC 22.0* 02/02/2015 0324   WBC 10.0 01/19/2015   WBC 10.4* 05/01/2014 1253   RBC 3.40* 02/02/2015 0324   RBC 4.45 05/01/2014 1253   RBC 4.59 04/18/2013 1109   HGB 9.7* 02/02/2015 0324   HGB 12.7 05/01/2014 1253   HCT 33.7* 02/02/2015 0324   HCT 41.6 05/01/2014 1253   PLT 325 02/02/2015 0324   MCV 99.1 02/02/2015 0324   MCV 93.4 05/01/2014 1253   MCH 28.5 02/02/2015 0324   MCH 28.6 05/01/2014 1253   MCHC 28.8* 02/02/2015 0324   MCHC 30.6* 05/01/2014 1253   RDW 15.0 02/02/2015 0324   LYMPHSABS 0.8 01/30/2015 0918   MONOABS 2.2* 01/30/2015 0918   EOSABS 0.0 01/30/2015 0918   BASOSABS 0.0 01/30/2015 0918    CMP     Component Value Date/Time   NA 141 02/02/2015 0324   NA 144 01/10/2015   K 4.3 02/02/2015 0324   CL 96* 02/02/2015 0324   CO2 36* 02/02/2015 0324   GLUCOSE 137* 02/02/2015 0324   BUN 24* 02/02/2015 0324   BUN 15 01/10/2015   CREATININE 0.51 02/02/2015 0324   CREATININE 0.3* 01/10/2015   CREATININE 0.46* 11/18/2014 1153   CALCIUM 8.7* 02/02/2015 0324   PROT 5.0* 01/31/2015 0245   ALBUMIN 2.7* 01/31/2015 0245  AST 19 01/31/2015 0245   ALT 21 01/31/2015 0245   ALKPHOS 57 01/31/2015 0245   BILITOT 0.6 01/31/2015 0245   GFRNONAA >60 02/02/2015 0324   GFRNONAA >89 11/18/2014 1153   GFRAA >60 02/02/2015 0324   GFRAA >89 11/18/2014 1153       Problem List:  Patient Active Problem List   Diagnosis Date Noted  . SOB (shortness of breath)   . Acute respiratory failure  ([place]) 01/30/2015  . HCAP (healthcare-associated pneumonia) 01/30/2015  . DNR (do not resuscitate) discussion 12/29/2014  . Acute on chronic respiratory failure with hypoxia and hypercapnia (HCC)   . Hyperkalemia 12/25/2014  . Tobacco abuse   . Noncompliance   . Acute on chronic respiratory failure with hypoxia ([place])   . Acute exacerbation of chronic obstructive pulmonary disease (COPD) ([place])   . Diabetes mellitus with complication ([place])   . Chronic respiratory failure with hypercapnia ([place]) 09/24/2014  . Palliative care by specialist   . Dysphagia   . Dyspnea 08/13/2014  . Tachycardia   . Respiratory acidosis   . Coronary artery disease involving native coronary artery of native heart without angina pectoris   . Anxiety   . B12 deficiency 02/10/2014  . Protein-calorie malnutrition, severe ([place]) 02/09/2014  . Acute respiratory distress (HCC) 02/08/2014  . Vitamin B 12 deficiency 06/28/2013  . Palliative care encounter 02/25/2013  . Weakness generalized 02/25/2013  . COPD exacerbation ([place]) 02/20/2013  . Anemia 02/20/2013  . Nocturnal hypoxemia 12/30/2012  . COPD GOLD III 02/06/2011  . Smoker 02/06/2011  . Hypertension 02/06/2011  . Diabetes mellitus type 2, controlled, without complications ([place]) 41/93/7902     Palliative Care Assessment & Plan    1.Code Status:  DNR  2. Goals of Care/Additional Recommendations:  Focus on comfort at end of life. She also does not want to feel like she is giving up and wants to continue meds as able.   Desire for further Chaplaincy support:no  Psycho-social Needs: Caregiving  Support/Resources and Education on Hospice  3. Symptom Management:  Continue Xanax 0.25 mg TID prn. May need increased dose at night as this does not help her sleep long enough.   SOB/dyspnea: She says that she has not been tolerating roxanol 10 mg - makes nauseated and poor appetite. Will decrease roxanol to 5 mg TID along with scheduled ondansetron to prevent  nausea - may increase roxanol if nausea controlled with this regimen. Continue morphine prn as well.   4. Palliative Prophylaxis:   Bowel Regimen and Delirium Protocol  5. Prognosis: I do feel she has made no progress with interventions and every breathe is labored for her. Likely to be weeks.   6. Discharge Planning:  Hopeful for [facility].   Discussed with [facility] liaison Lattie Haw, CMRN, Dr. Candiss Norse.   Thank you for allowing the Palliative Medicine Team to assist in the care of this patient.   Time In: 1300 Time Out: 1400 Total Time 86mn Prolonged Time Billed  no         APershing Proud NP  140/97/3532 2:01 PM  Please contact Palliative Medicine Team phone at [phone] questions and concerns.

## 2015-02-04 NOTE — Progress Notes (Signed)
Patient Demographics:    Judith Boyer, is a 69 y.o. female, DOB - 16-Apr-1945, [id number]  Admit date - 01/30/2015   Admitting Physician Eugenie Filler, MD  Outpatient Primary MD for the patient is DAUB, [name], MD  LOS - 5   Chief Complaint  Patient presents with  . Shortness of Breath        Subjective:    Judith Boyer today has, No headache, No chest pain, No abdominal pain - No Nausea, No new weakness tingling or numbness, continues to have cough with shortness of breath overall mildly improved.   Assessment  & Plan :     1. Acute on chronic hypoxic and hypercapnic respiratory failure due to COPD exacerbation in the setting of HCAP. She has advanced COPD, currently on empiric IV antibiotics with HCAP along with IV steroids, on nebulizer treatments, needed BiPAP initially. Overall mildly improved but appears to have extremely advanced underlying COPD.   Discussed with the patient she does not want to get intubated and I told her that her prognosis appears poor. Pulmonary has been consulted, I have also consulted Pall Care to discuss goals of care as her long-term prognosis appears poor. She will likely go to SNF with comfort care versus residential hospice based on the decision made, she is requesting one more day to decide.   2. Severe protein calorie malnutrition. On nutritional supplements.   3. Dyslipidemia. On statin.   4. GERD. PPI continued.   5. Essential hypertension. On ARB continue for now.   6. History of CAD. On statin, as pain-free, EKG with nonspecific changes. Supportive care.   7. DM type II. Continue sliding scale for now, add low-dose Lantus for better control while she is on steroids  Lab Results  Component Value Date   HGBA1C 7.2* 01/30/2015     CBG (last 3)   Recent Labs  02/03/15 2235 02/03/15 2311 02/04/15 0822  GLUCAP 78 115* 117*      Code Status : No intubation prognosis is guarded  Family Communication  : None present  Disposition Plan  : SNF with palliative care versus residential hospice on 02/05/2015  Consults  :  PCCM, palliative care  Procedures  :    DVT Prophylaxis  :  Lovenox   Lab Results  Component Value Date   PLT 325 02/02/2015    Inpatient Medications  Scheduled Meds: . antiseptic oral rinse  7 mL Mouth Rinse BID  . arformoterol  15 mcg Nebulization BID  . atorvastatin  10 mg Oral q1800  . budesonide (PULMICORT) nebulizer solution  0.5 mg Nebulization BID  . dextromethorphan-guaiFENesin  2 tablet Oral BID  . diltiazem  60 mg Oral 3 times per day  . enoxaparin (LOVENOX) injection  40 mg Subcutaneous Q24H  . feeding supplement (GLUCERNA SHAKE)  237 mL Oral TID BM  . ferrous sulfate  325 mg Oral BID WC  . furosemide  20 mg Oral Daily  . insulin aspart  0-20 Units Subcutaneous TID WC  . insulin glargine  20 Units Subcutaneous Daily  . ipratropium  0.5 mg Nebulization Q6H  . levalbuterol  0.63 mg Nebulization Q6H  . levofloxacin  750 mg Oral Daily  . losartan  25 mg Oral  Daily  . montelukast  10 mg Oral QHS  . morphine CONCENTRATE  10 mg Oral TID  . pantoprazole  40 mg Oral BID  . predniSONE  50 mg Oral Q breakfast  . senna-docusate  2 tablet Oral QHS  . sodium chloride  3 mL Intravenous Q12H   Continuous Infusions:   PRN Meds:.acetaminophen **OR** [DISCONTINUED] acetaminophen, ALPRAZolam, alum & mag hydroxide-simeth, bisacodyl, guaiFENesin-codeine, ipratropium, levalbuterol, morphine injection, nitroGLYCERIN, [DISCONTINUED] ondansetron **OR** ondansetron (ZOFRAN) IV, senna-docusate, sodium chloride, sodium phosphate, sorbitol  Antibiotics  :     Anti-infectives    Start     Dose/Rate Route Frequency Ordered Stop   02/03/15 1000  levofloxacin (LEVAQUIN) tablet 750 mg      750 mg Oral Daily 02/03/15 0942     01/30/15 1430  oseltamivir (TAMIFLU) capsule 30 mg  Status:  Discontinued     30 mg Oral 2 times daily 01/30/15 1326 01/31/15 1013   01/30/15 1400  ceFEPIme (MAXIPIME) 1 g in dextrose 5 % 50 mL IVPB  Status:  Discontinued     1 g 100 mL/hr over 30 Minutes Intravenous 3 times per day 01/30/15 1211 01/30/15 1211   01/30/15 1330  oseltamivir (TAMIFLU) capsule 75 mg  Status:  Discontinued     75 mg Oral 2 times daily 01/30/15 1321 01/30/15 1325   01/30/15 1200  piperacillin-tazobactam (ZOSYN) IVPB 3.375 g  Status:  Discontinued     3.375 g 12.5 mL/hr over 240 Minutes Intravenous Every 8 hours 01/30/15 1054 02/03/15 0942   01/30/15 1200  vancomycin (VANCOCIN) IVPB 1000 mg/200 mL premix  Status:  Discontinued     1,000 mg 200 mL/hr over 60 Minutes Intravenous Every 24 hours 01/30/15 1054 02/03/15 0942        Objective:   Filed Vitals:   02/04/15 0158 02/04/15 0230 02/04/15 0437 02/04/15 0559  BP:  142/60  158/61  Pulse:  87  89  Temp:  98.1 F (36.7 C)  98.8 F (37.1 C)  TempSrc:  Oral  Oral  Resp:  16  20  Height:      Weight:   50.259 kg (110 lb 12.8 oz)   SpO2: 98% 99%  100%    Wt Readings from Last 3 Encounters:  02/04/15 50.259 kg (110 lb 12.8 oz)  01/24/15 48.988 kg (108 lb)  12/21/14 44.7 kg (98 lb 8.7 oz)     Intake/Output Summary (Last 24 hours) at 02/04/15 1039 Last data filed at 02/04/15 1020  Gross per 24 hour  Intake    597 ml  Output   2310 ml  Net  -1713 ml     Physical Exam  Awake Alert, Oriented X 3, No new F.N deficits, Normal affect [place].AT,PERRAL Supple Neck,No JVD, No cervical lymphadenopathy appriciated.  Symmetrical Chest wall movement, minimal air movement bilaterally, exp wheezes, coarse B sounds RRR,No Gallops,Rubs or new Murmurs, No Parasternal Heave +ve B.Sounds, Abd Soft, No tenderness, No organomegaly appriciated, No rebound - guarding or rigidity. No Cyanosis, Clubbing or edema, No new Rash or bruise        Data Review:   Micro Results Recent Results (from the past 240 hour(s))  Culture, Urine     Status: None   Collection Time: 01/30/15  3:47 PM  Result Value Ref Range Status   Specimen Description URINE, RANDOM  Final   Special Requests ADDED 2223  Final   Culture NO GROWTH 1 DAY  Final   Report Status 01/31/2015 FINAL  Final  Culture,  blood (routine x 2) Call MD if unable to obtain prior to antibiotics being given     Status: None (Preliminary result)   Collection Time: 01/30/15  4:00 PM  Result Value Ref Range Status   Specimen Description BLOOD LEFT ANTECUBITAL  Final   Special Requests IN PEDIATRIC BOTTLE 1CC  Final   Culture NO GROWTH 4 DAYS  Final   Report Status PENDING  Incomplete  Culture, blood (routine x 2) Call MD if unable to obtain prior to antibiotics being given     Status: None (Preliminary result)   Collection Time: 01/30/15  4:08 PM  Result Value Ref Range Status   Specimen Description BLOOD RIGHT HAND  Final   Special Requests IN PEDIATRIC BOTTLE [place]  Final   Culture NO GROWTH 4 DAYS  Final   Report Status PENDING  Incomplete  MRSA PCR Screening     Status: None   Collection Time: 01/30/15  5:55 PM  Result Value Ref Range Status   MRSA by PCR NEGATIVE NEGATIVE Final    Comment:        The GeneXpert MRSA Assay (FDA approved for NASAL specimens only), is one component of a comprehensive MRSA colonization surveillance program. It is not intended to diagnose MRSA infection nor to guide or monitor treatment for MRSA infections.   Culture, sputum-assessment     Status: None   Collection Time: 01/31/15  7:33 AM  Result Value Ref Range Status   Specimen Description SPUTUM  Final   Special Requests NONE  Final   Sputum evaluation   Final    MICROSCOPIC FINDINGS SUGGEST THAT THIS SPECIMEN IS NOT REPRESENTATIVE OF LOWER RESPIRATORY SECRETIONS. PLEASE RECOLLECT. Gram Stain Report Called to,Read Back By and Verified With: Tonita Cong AT [id number] 01/31/15 BY  L BENFIELD    Report Status 01/31/2015 FINAL  Final    Radiology Reports Dg Chest 2 View  02/01/2015  CLINICAL DATA:  69 year old female with shortness of breath, cough and congestion. EXAM: CHEST  2 VIEW COMPARISON:  02/01/2015 and prior exams FINDINGS: Cardiomegaly identified. Left basilar opacity is unchanged. There is no evidence of pneumothorax, pulmonary edema or pulmonary mass. No acute bony abnormalities are identified. IMPRESSION: Unchanged appearance of the chest with continued left basilar opacity which may represent atelectasis or airspace disease/ pneumonia. Electronically Signed   By: Margarette Canada M.D.   On: 02/01/2015 11:11   Dg Chest Port 1 View  02/01/2015  CLINICAL DATA:  Shortness of breath. EXAM: PORTABLE CHEST 1 VIEW COMPARISON:  01/30/2015. FINDINGS: Mediastinum hilar structures normal. Cardiomegaly with pulmonary vascular congestion and prominent interstitial changes consistent with congestive heart failure. Bibasilar atelectasis and/or pneumonia noted. Small left pleural effusion cannot be excluded. No pneumothorax. IMPRESSION: 1. Cardiomegaly with mild pulmonary venous congestion interstitial prominence suggesting congestive heart failure. Small left pleural effusion cannot be excluded . 2. Bibasilar atelectasis and/or infiltrates again noted. Electronically Signed   By: Marcello Moores  Register   On: 02/01/2015 07:16   Dg Chest Portable 1 View  01/30/2015  CLINICAL DATA:  Worsening shortness of Breath for couple of days, history of COPD EXAM: PORTABLE CHEST 1 VIEW COMPARISON:  12/25/2014 FINDINGS: Cardiomediastinal silhouette is stable. Mild hyperinflation. There is patchy left basilar atelectasis or early infiltrate. No pulmonary edema. Atherosclerotic calcifications of thoracic aorta again noted. IMPRESSION: Mild hyperinflation again noted. Patchy left basilar atelectasis or early infiltrate. No pulmonary edema. Electronically Signed   By: Judith Boyer M.D.   On: 01/30/2015 10:14  CBC  Recent Labs Lab 01/30/15 0918 01/31/15 0245 02/01/15 0320 02/02/15 0324  WBC 27.7* 22.5* 25.6* 22.0*  HGB 10.8* 8.7* 9.1* 9.7*  HCT 36.0 28.3* 31.1* 33.7*  PLT 349 253 293 325  MCV 97.0 96.6 97.8 99.1  MCH 29.1 29.7 28.6 28.5  MCHC 30.0 30.7 29.3* 28.8*  RDW 15.2 15.0 15.0 15.0  LYMPHSABS 0.8  --   --   --   MONOABS 2.2*  --   --   --   EOSABS 0.0  --   --   --   BASOSABS 0.0  --   --   --     Chemistries   Recent Labs Lab 01/30/15 0918 01/30/15 1600 01/31/15 0245 02/01/15 0320 02/01/15 1615 02/02/15 0324  NA 136  --  139 138  --  141  K 4.1  --  4.3 5.4* 4.4 4.3  CL 96*  --  104 101  --  96*  CO2 30  --  27 31  --  36*  GLUCOSE 298*  --  250* 260*  --  137*  BUN 9  --  18 25*  --  24*  CREATININE 0.37*  --  0.52 0.55  --  0.51  CALCIUM 8.6*  --  8.2* 8.6*  --  8.7*  MG  --  1.9  --  2.1  --   --   AST  --   --  19  --   --   --   ALT  --   --  21  --   --   --   ALKPHOS  --   --  57  --   --   --   BILITOT  --   --  0.6  --   --   --    ------------------------------------------------------------------------------------------------------------------ estimated creatinine clearance is 49.4 mL/min (by C-G formula based on Cr of 0.51). ------------------------------------------------------------------------------------------------------------------ No results for input(s): HGBA1C in the last 72 hours. ------------------------------------------------------------------------------------------------------------------ No results for input(s): CHOL, HDL, LDLCALC, TRIG, CHOLHDL, LDLDIRECT in the last 72 hours. ------------------------------------------------------------------------------------------------------------------ No results for input(s): TSH, T4TOTAL, T3FREE, THYROIDAB in the last 72 hours.  Invalid input(s): FREET3 ------------------------------------------------------------------------------------------------------------------ No results for  input(s): VITAMINB12, FOLATE, FERRITIN, TIBC, IRON, RETICCTPCT in the last 72 hours.  Coagulation profile No results for input(s): INR, PROTIME in the last 168 hours.  No results for input(s): DDIMER in the last 72 hours.  Cardiac Enzymes No results for input(s): CKMB, TROPONINI, MYOGLOBIN in the last 168 hours.  Invalid input(s): CK ------------------------------------------------------------------------------------------------------------------ Invalid input(s): POCBNP   Time Spent in minutes   35   Jamilette Suchocki K M.D on 02/04/2015 at 10:39 AM  Between 7am to 7pm - Pager - [phone]  After 7pm go to [email or web address] - password [facility]  [facility] -  Office  [phone]

## 2015-02-04 NOTE — Progress Notes (Signed)
OT Cancellation Note  Patient Details Name: Judith Boyer MRN: [id number] DOB: [birth year]   Cancelled Treatment:    Reason Eval/Treat Not Completed: Medical issues which prohibited therapy -- Patient reports nausea and unable to participate in OT session today.  Krystin Keeven A 02/04/2015, 2:21 PM

## 2015-02-04 NOTE — Telephone Encounter (Signed)
Pt is currently at [facility]. She is being discharged tomorrow and going to hospice. Jocelyn Lamer from Hospice wants to know if Dr. Everlene Farrier will be attending physician. Their doctor glad to assist with medicine management. Dr. Everlene Farrier said he is happy to be her primary physician but wants the physician there to manage her medicines.   Pt will be discharged and sent home. Her Ins will not cover for her to go to a facility. [phone]

## 2015-02-04 NOTE — Consult Note (Signed)
   [facility] CM Inpatient Consult   02/04/2015  Judith Boyer 12/17/1945 [id number] Patient was screened and following  For possible restart of services.  Chart review reveals patient is likely to discharge to residential hospice.  [facility] will sign off at this point.  Spoke with inpatient RNCM. For questions, please contact: Natividad Brood, RN BSN [facility] Liaison  [phone] business mobile phone

## 2015-02-04 NOTE — Progress Notes (Signed)
Patient is resting comfortably. No other needs expressed at the moment.

## 2015-02-04 NOTE — Progress Notes (Addendum)
Patient's respiratory status has been reevaluated. Patient's life expectancy is now less than previously assessed. Hospice will assess patient for appropriateness. Please refer to Palliative note.  Percell Locus Quinnlan Abruzzo LCSWA [phone]

## 2015-02-04 NOTE — Progress Notes (Signed)
RN was called to patient's room. Patient stated "I cannot breath." RN encouraged patient deep breathing & cough. VS was stable, O2 sat 99% on 4L nasal cannula. Patient denies chest pain, still feeling short of breath, stated "I'm just anxious." RN is to give patient PRN Xanax. Will continue to monitor.

## 2015-02-04 NOTE — Care Management Note (Addendum)
Case Management Note  Patient Details  Name: Judith Boyer MRN: [id number] Date of Birth: 08-18-1945  Subjective/Objective:                  Date-02-04-15 Initial Assessment Patient transferred from [facility] within last 24 hours. Plan for DC tomorrow.  Spoke with patient at the bedside along with friend.  Introduced self as Tourist information centre manager and explained role in discharge planning and how to be reached.  Verified patient lives in [place] alone. Patient states that she has two sons who both work One lives in [place], and one lives next door.  Verified patient anticipates to go home with hospice at this time. Patient states she will be alone most of the day and has concerns about how she will get up and go to the bathroom and make food for herself. Patient does not have any SNF days at this time, she also does not qualify for residential hospice. Patient states that she cannot afford copay to go to a SNF. CM verified with CSW that patient is unable to receive to LOG, as she has insurance. Patient will have limited supervision by family at this time to best of their knowledge.  Patient has DME cane walker oxygen through [place]. Expressed potential need for no other DME.  Patient confirmed  needing help with their medication.  Patient is driven by son to MD appointments.  Verified patient has PCP name. Patient states they currently receive [place] services through no one.  Patient was provided choice and selected [facility] needs, referral made.    Plan: CM will continue to follow for discharge planning and [facility] resources.   Carles Collet RN BSN CM [phone]   Action/Plan:  Referral made to [facility]. Spoke with [employer], they will assess her today. Patient will also be provided with private duty list.  Addendum: Patient reevaluated through palliative care. Prognosis worsened. Anticipate DC over the weekend to residential  hospice. Dent updated.   Expected Discharge Date:  02/01/15               Expected Discharge Plan:  Home w Hospice Care  In-House Referral:  Clinical Social Work  Discharge planning Services  CM Consult  Post Acute Care Choice:  Hospice Choice offered to:  Patient  DME Arranged:    DME Agency:     HH Arranged:    [place] Agency:  [facility]  Status of Service:  In process, will continue to follow  Medicare Important Message Given:  Yes Date Medicare IM Given:    Medicare IM give by:    Date Additional Medicare IM Given:    Additional Medicare Important Message give by:     If discussed at [place] of Stay Meetings, dates discussed:    Additional Comments:  Carles Collet, RN 02/04/2015, 12:24 PM

## 2015-02-05 LAB — GLUCOSE, CAPILLARY: Glucose-Capillary: 96 mg/dL (ref 65–99)

## 2015-02-05 MED ORDER — LORAZEPAM 2 MG/ML PO CONC: 1.0000 mg | Freq: Four times a day (QID) | ORAL | Status: DC | PRN

## 2015-02-05 MED ORDER — INSULIN ASPART 100 UNIT/ML ~~LOC~~ SOLN: SUBCUTANEOUS | Status: DC

## 2015-02-05 MED ORDER — MORPHINE SULFATE (CONCENTRATE) 10 MG/0.5ML PO SOLN: 10.0000 mg | ORAL | Status: DC | PRN

## 2015-02-05 MED ORDER — PREDNISONE 5 MG PO TABS: ORAL_TABLET | ORAL | Status: DC

## 2015-02-05 MED ORDER — INSULIN GLARGINE 100 UNIT/ML ~~LOC~~ SOLN: 25.0000 [IU] | Freq: Every day | SUBCUTANEOUS | Status: DC

## 2015-02-05 NOTE — Clinical Social Work Note (Signed)
Per MD patient ready for DC to [facility]. RN, patient, patient's family [name]), and facility notified of DC. RN given number for report. DC packet on chart. Ambulance transport requested for patient. CSW signing off.    Liz [place] MSW, [place], [place], [id number]

## 2015-02-05 NOTE — Discharge Instructions (Signed)
Follow with Primary MD DAUB, STEVE A, MD as desired.  Activity: As tolerated with Full fall precautions use walker/cane & assistance as needed  Disposition Residential hospice  Diet:   Regular as tolerated with feeding assistance and aspiration precautions.

## 2015-02-05 NOTE — Consult Note (Signed)
[facility] Liaison: [place] room available for patient today. Met with patient, g-dtr and d-i-l yesterday evening to complete paper work for transfer today. Dr. Orpah Melter to assume care per patient/family request.   Please fax discharge summary to [phone].  RN please call report to [phone].  Thank you. Erling Conte, [place]

## 2015-02-05 NOTE — Discharge Summary (Signed)
Judith Boyer, is a 69 y.o. female  DOB 10/05/1945  MRN [id number].  Admission date:  01/30/2015  Admitting Physician  Eugenie Filler, MD  Discharge Date:  02/05/2015   Primary MD  Jenny Reichmann, MD  Recommendations for primary care physician for things to follow:   Goal of care is full comfort being discharged to residential hospice   Admission Diagnosis  COPD exacerbation ([place]) [J44.1] HCAP (healthcare-associated pneumonia) [J18.9]   Discharge Diagnosis  COPD exacerbation ([place]) [J44.1] HCAP (healthcare-associated pneumonia) [J18.9]    Principal Problem:   Acute on chronic respiratory failure with hypoxia and hypercapnia (HCC) Active Problems:   Hypertension   Diabetes mellitus type 2, controlled, without complications (HCC)   COPD exacerbation (HCC)   Vitamin B 12 deficiency   Protein-calorie malnutrition, severe ([place])   B12 deficiency   Coronary artery disease involving native coronary artery of native heart without angina pectoris   Anxiety   Tachycardia   Chronic respiratory failure with hypercapnia (HCC)   Acute exacerbation of chronic obstructive pulmonary disease (COPD) ([place])   Diabetes mellitus with complication (HCC)   Tobacco abuse   HCAP (healthcare-associated pneumonia)   SOB (shortness of breath)      Past Medical History  Diagnosis Date  . COPD (chronic obstructive pulmonary disease) ([place])   . Hypertension   . Coronary artery disease   . Shortness of breath   . High cholesterol   . Myocardial infarction ([place]) 02/2011  . On home oxygen therapy     "2L; 24h/day" (12/20/2014)  . Pneumonia     "couple times" (02/08/2014)  . Type II diabetes mellitus ([place])     type 2  . Anemia   . History of blood transfusion 2014    "extremely anemic"    Past Surgical History    Procedure Laterality Date  . Shoulder arthroscopy w/ rotator cuff repair Left   . Left heart catheterization with coronary angiogram N/A 02/09/2011    Procedure: LEFT HEART CATHETERIZATION WITH CORONARY ANGIOGRAM;  Surgeon: Birdie Riddle, MD;  Location: [facility]ATH [facility];  Service: Cardiovascular;  Laterality: N/A;  . Tonsillectomy    . Abdominal hysterectomy  1970's  . Coronary angioplasty with stent placement  12/2007    "2"  . Cardiac catheterization  02/2011       HPI  from the history and physical done on the day of admission:   Judith Boyer is a 69 y.o. female With history of COPD on home O2 3 L nasal cannula, coronary artery disease, hypertension, type 2 diabetes, anemia who presents to the ED with a three-day history of worsening shortness of breath which over the past night and on the morning of admission became very terrible per patient. Patient stated that she tried albuterol and Combivent inhalers/nebulizers with no significant improvement and his son and subsequently called EMS and she was brought to the ED. Patient denies any fevers, no chills, no chest pain, no abdominal pain, no diarrhea, no melena, no hematemesis, no hematochezia.  Patient does endorse a productive cough of thick yellowish sputum, wheezing, shortness of breath. Patient also endorses some swelling in her right foot. Patient endorses some rattling in her chest. When EMS arrived pick her up she was given some Solu-Medrol and Combivent with no significant improvement. In the ED patient was subsequently placed on the BiPAP. On admission patient was noted to have a heart rate as high as 141 with a respiratory rate as high as 35 and a WBC of 27.7. [facility] hospital[facility] were called to admit the patient for further evaluation and management.      Hospital Course:     1. Acute on chronic hypoxic and hypercapnic respiratory failure due to COPD exacerbation in the setting of HCAP. She has advanced COPD, currently on empiric  antibiotics with HCAP along with IV steroids, on nebulizer treatments, needed BiPAP initially. Overall mildly improved but appears to have extremely advanced underlying COPD. now has finished antibiotic treatment no evidence of ongoing infection. She has very advanced COPD at baseline with poor long-term prognosis.  Discussed with the patient she does not want to get intubated and I told her that her prognosis appears poor. Pulmonary has been consulted, I have also consulted Pall Care to discuss goals of care as her long-term prognosis appears poor.   After detailed discussions with pulmonary and palliative care patient has decided to go to residential hospice which will be arranged. Goal of care will be full comfort.  Note she is chronically on 20 mg of prednisone daily, she will be placed on a prednisone taper but should be continued on 20 mg daily after the taper reaches to 20 mg level.    2. Severe protein calorie malnutrition. Protein supplements as tolerated.   3. Dyslipidemia. Stop statin as she is hospice.   4. GERD. PPI continued.   5. Essential hypertension. Supportive care only stable.   6. History of CAD. Now full comfort care, provide supportive care as needed.   7. DM type II. As she is on steroid she has been placed on Lantus along with sliding scale, monitor CBGs every before meals and at nighttime and adjust as needed.       Discharge Condition: Guarded  Follow UP  Follow-up Information    Schedule an appointment as soon as possible for a visit with DAUB, [name], MD.   Specialty:  Family Medicine   Why:  As needed   Contact information:   [place] [id number] [phone]        Consults obtained - pulmonary, palliative care  Diet and Activity recommendation: See Discharge Instructions below  Discharge Instructions       Discharge Instructions    Diet - low sodium heart healthy    Complete by:  As directed      Discharge  instructions    Complete by:  As directed   Follow with Primary MD DAUB, STEVE A, MD as desired.  Activity: As tolerated with Full fall precautions use walker/cane & assistance as needed  Disposition Residential hospice  Diet:   Regular as tolerated with feeding assistance and aspiration precautions.     Increase activity slowly    Complete by:  As directed              Discharge Medications       Medication List    STOP taking these medications        ALPRAZolam 0.25 MG tablet  Commonly known as:  Duanne Moron  ALPRAZolam 0.5 MG tablet  Commonly known as:  XANAX     atorvastatin 10 MG tablet  Commonly known as:  LIPITOR     ferrous sulfate 325 (65 FE) MG tablet     insulin NPH Human 100 UNIT/ML injection  Commonly known as:  HUMULIN N,NOVOLIN N     insulin regular 100 units/mL injection  Commonly known as:  NOVOLIN R,HUMULIN R     losartan 25 MG tablet  Commonly known as:  COZAAR     metFORMIN 500 MG tablet  Commonly known as:  GLUCOPHAGE     potassium chloride 10 MEQ tablet  Commonly known as:  K-DUR,KLOR-CON     predniSONE 10 MG tablet  Commonly known as:  DELTASONE  Replaced by:  predniSONE 5 MG tablet     predniSONE 20 MG tablet  Commonly known as:  DELTASONE      TAKE these medications        acetaminophen 325 MG tablet  Commonly known as:  TYLENOL  Take 2 tablets (650 mg total) by mouth every 6 (six) hours as needed for mild pain (or Fever >/= 101).     PROAIR HFA 108 (90 Base) MCG/ACT inhaler  Generic drug:  albuterol  Inhale 2 puffs into the lungs every 4 (four) hours as needed for wheezing or shortness of breath.     albuterol (2.5 MG/3ML) 0.083% nebulizer solution  Commonly known as:  PROVENTIL  Take 2.5 mg by nebulization every 4 (four) hours as needed for wheezing or shortness of breath.     arformoterol 15 MCG/2ML Nebu  Commonly known as:  BROVANA  Take 2 mLs (15 mcg total) by nebulization 2 (two) times daily.     bisacodyl 10 MG  suppository  Commonly known as:  DULCOLAX  Place 1 suppository (10 mg total) rectally daily as needed for moderate constipation.     budesonide 0.5 MG/2ML nebulizer solution  Commonly known as:  PULMICORT  Take 0.5 mg by nebulization every 12 (twelve) hours.     diltiazem 90 MG tablet  Commonly known as:  CARDIZEM  Take 1 tablet (90 mg total) by mouth every 8 (eight) hours.     feeding supplement (GLUCERNA SHAKE) Liqd  Take 237 mLs by mouth 3 (three) times daily between meals.     furosemide 20 MG tablet  Commonly known as:  LASIX  Take 20 mg by mouth daily.     insulin aspart 100 UNIT/ML injection  Commonly known as:  NOVOLOG  Before each meal 3 times a day, 140-199 - 2 units, 200-250 - 4 units, 251-299 - 6 units,  300-349 - 8 units,  350 or above 10 units. Dispense syringes and needles as needed, Ok to switch to PEN if approved. Substitute to any brand approved. DX DM2, Code E11.65     insulin glargine 100 UNIT/ML injection  Commonly known as:  LANTUS  Inject 0.25 mLs (25 Units total) into the skin daily. Dispense insulin pen if approved, if not dispense as needed syringes and needles for 1 month supply. Can switch to Levemir. Diagnosis E 11.65.     ipratropium-albuterol 0.5-2.5 (3) MG/3ML Soln  Commonly known as:  DUONEB  Take 3 mLs by nebulization 4 (four) times daily - after meals and at bedtime.     LORazepam 2 MG/ML concentrated solution  Commonly known as:  ATIVAN  Take 0.5 mLs (1 mg total) by mouth every 6 (six) hours as needed for anxiety.  Melatonin 3 MG Tabs  Take 3 mg by mouth at bedtime.     montelukast 10 MG tablet  Commonly known as:  SINGULAIR  take 1 tablet by mouth once daily     morphine CONCENTRATE 10 MG/0.5ML Soln concentrated solution  Take 0.5 mLs (10 mg total) by mouth every 3 (three) hours as needed for moderate pain or severe pain.     MUCINEX MAXIMUM STRENGTH 1200 MG Tb12  Generic drug:  Guaifenesin  Take 1,200 mg by mouth 2 (two) times  daily.     nitroGLYCERIN 0.4 MG SL tablet  Commonly known as:  NITROSTAT  Place 1 tablet (0.4 mg total) under the tongue every 5 (five) minutes x 3 doses as needed for chest pain.     OXYGEN  Inhale 3 L into the lungs continuous.     pantoprazole 40 MG tablet  Commonly known as:  PROTONIX  Take 40 mg by mouth 2 (two) times daily.     polyethylene glycol packet  Commonly known as:  MIRALAX / GLYCOLAX  Take 17 g by mouth daily as needed (constipation).     predniSONE 5 MG tablet  Commonly known as:  DELTASONE  Label  & dispense according to the schedule below. 10 Pills PO for 3 days then, 8 Pills PO for 3 days, 6 Pills PO for 3 days, then take 4 pills daily as before this is your home dose of 20 mg of prednisone daily.     senna-docusate 8.6-50 MG tablet  Commonly known as:  Senokot-S  Take 2 tablets by mouth at bedtime.     sodium chloride 0.65 % Soln nasal spray  Commonly known as:  OCEAN  Place 1 spray into both nostrils as needed for congestion.        Major procedures and Radiology Reports - PLEASE review detailed and final reports for all details, in brief -       Dg Chest 2 View  02/01/2015  CLINICAL DATA:  69 year old female with shortness of breath, cough and congestion. EXAM: CHEST  2 VIEW COMPARISON:  02/01/2015 and prior exams FINDINGS: Cardiomegaly identified. Left basilar opacity is unchanged. There is no evidence of pneumothorax, pulmonary edema or pulmonary mass. No acute bony abnormalities are identified. IMPRESSION: Unchanged appearance of the chest with continued left basilar opacity which may represent atelectasis or airspace disease/ pneumonia. Electronically Signed   By: Margarette Canada M.D.   On: 02/01/2015 11:11   Dg Chest Port 1 View  02/01/2015  CLINICAL DATA:  Shortness of breath. EXAM: PORTABLE CHEST 1 VIEW COMPARISON:  01/30/2015. FINDINGS: Mediastinum hilar structures normal. Cardiomegaly with pulmonary vascular congestion and prominent interstitial  changes consistent with congestive heart failure. Bibasilar atelectasis and/or pneumonia noted. Small left pleural effusion cannot be excluded. No pneumothorax. IMPRESSION: 1. Cardiomegaly with mild pulmonary venous congestion interstitial prominence suggesting congestive heart failure. Small left pleural effusion cannot be excluded . 2. Bibasilar atelectasis and/or infiltrates again noted. Electronically Signed   By: Marcello Moores  Register   On: 02/01/2015 07:16   Dg Chest Portable 1 View  01/30/2015  CLINICAL DATA:  Worsening shortness of Breath for couple of days, history of COPD EXAM: PORTABLE CHEST 1 VIEW COMPARISON:  12/25/2014 FINDINGS: Cardiomediastinal silhouette is stable. Mild hyperinflation. There is patchy left basilar atelectasis or early infiltrate. No pulmonary edema. Atherosclerotic calcifications of thoracic aorta again noted. IMPRESSION: Mild hyperinflation again noted. Patchy left basilar atelectasis or early infiltrate. No pulmonary edema. Electronically Signed   By: Julien Girt  Pop M.D.   On: 01/30/2015 10:14    Micro Results      Recent Results (from the past 240 hour(s))  Culture, Urine     Status: None   Collection Time: 01/30/15  3:47 PM  Result Value Ref Range Status   Specimen Description URINE, RANDOM  Final   Special Requests ADDED 2223  Final   Culture NO GROWTH 1 DAY  Final   Report Status 01/31/2015 FINAL  Final  Culture, blood (routine x 2) Call MD if unable to obtain prior to antibiotics being given     Status: None   Collection Time: 01/30/15  4:00 PM  Result Value Ref Range Status   Specimen Description BLOOD LEFT ANTECUBITAL  Final   Special Requests IN PEDIATRIC BOTTLE [place]  Final   Culture NO GROWTH 5 DAYS  Final   Report Status 02/04/2015 FINAL  Final  Culture, blood (routine x 2) Call MD if unable to obtain prior to antibiotics being given     Status: None   Collection Time: 01/30/15  4:08 PM  Result Value Ref Range Status   Specimen Description BLOOD RIGHT  HAND  Final   Special Requests IN PEDIATRIC BOTTLE [place]  Final   Culture NO GROWTH 5 DAYS  Final   Report Status 02/04/2015 FINAL  Final  MRSA PCR Screening     Status: None   Collection Time: 01/30/15  5:55 PM  Result Value Ref Range Status   MRSA by PCR NEGATIVE NEGATIVE Final    Comment:        The GeneXpert MRSA Assay (FDA approved for NASAL specimens only), is one component of a comprehensive MRSA colonization surveillance program. It is not intended to diagnose MRSA infection nor to guide or monitor treatment for MRSA infections.   Culture, sputum-assessment     Status: None   Collection Time: 01/31/15  7:33 AM  Result Value Ref Range Status   Specimen Description SPUTUM  Final   Special Requests NONE  Final   Sputum evaluation   Final    MICROSCOPIC FINDINGS SUGGEST THAT THIS SPECIMEN IS NOT REPRESENTATIVE OF LOWER RESPIRATORY SECRETIONS. PLEASE RECOLLECT. Gram Stain Report Called to,Read Back By and Verified With: Tonita Cong AT [id number] 01/31/15 BY L BENFIELD    Report Status 01/31/2015 FINAL  Final       Today   Subjective    [name] today has no headache,no chest abdominal pain,no new weakness tingling or numbness, feels little better agreeable to going to residential hospice.    Objective   Blood pressure 142/64, pulse 91, temperature 98.3 F (36.8 C), temperature source Oral, resp. rate 18, height 5\' 1"  (1.549 m), weight 47.492 kg (104 lb 11.2 oz), SpO2 95 %.   Intake/Output Summary (Last 24 hours) at 02/05/15 1052 Last data filed at 02/05/15 0959  Gross per 24 hour  Intake    842 ml  Output   2420 ml  Net  -1578 ml    Exam Awake Alert, Oriented x 3, No new F.N deficits, Normal affect [place].AT,PERRAL Supple Neck,No JVD, No cervical lymphadenopathy appriciated.  Symmetrical Chest wall movement, minimal air movement bilaterally, bilateral coarse breath sounds with wheezes RRR,No Gallops,Rubs or new Murmurs, No Parasternal Heave +ve  B.Sounds, Abd Soft, Non tender, No organomegaly appriciated, No rebound -guarding or rigidity. No Cyanosis, Clubbing or edema, No new Rash or bruise   Data Review   CBC w Diff: Lab Results  Component Value Date   WBC 22.0* 02/02/2015  WBC 10.0 01/19/2015   WBC 10.4* 05/01/2014   HGB 9.7* 02/02/2015   HGB 12.7 05/01/2014   HCT 33.7* 02/02/2015   HCT 41.6 05/01/2014   PLT 325 02/02/2015   LYMPHOPCT 3 01/30/2015   MONOPCT 8 01/30/2015   EOSPCT 0 01/30/2015   BASOPCT 0 01/30/2015    CMP: Lab Results  Component Value Date   NA 141 02/02/2015   NA 144 01/10/2015   K 4.3 02/02/2015   CL 96* 02/02/2015   CO2 36* 02/02/2015   BUN 24* 02/02/2015   BUN 15 01/10/2015   CREATININE 0.51 02/02/2015   CREATININE 0.3* 01/10/2015   CREATININE 0.46* 11/18/2014   GLU 174 01/10/2015   PROT 5.0* 01/31/2015   ALBUMIN 2.7* 01/31/2015   BILITOT 0.6 01/31/2015   ALKPHOS 57 01/31/2015   AST 19 01/31/2015   ALT 21 01/31/2015  .   Total Time in preparing paper work, data evaluation and todays exam - 35 minutes  Thurnell Lose M.D on 02/05/2015 at 10:52 AM  [facility]   Office  [phone]

## 2015-02-05 NOTE — Progress Notes (Signed)
02/05/15 Patient going to [facility] today, IV site removed and discharge instructions called to facility.

## 2015-02-05 NOTE — Telephone Encounter (Signed)
t is still unclear where patient is going to be transferred. She may go home. Decision to be made this weekend at  the hospital.

## 2015-02-13 DIAGNOSIS — J449 Chronic obstructive pulmonary disease, unspecified: Secondary | ICD-10-CM | POA: Diagnosis not present

## 2015-02-16 DIAGNOSIS — J449 Chronic obstructive pulmonary disease, unspecified: Secondary | ICD-10-CM | POA: Diagnosis not present

## 2015-02-17 ENCOUNTER — Other Ambulatory Visit: Payer: Self-pay

## 2015-03-03 ENCOUNTER — Telehealth: Payer: Self-pay

## 2015-03-03 NOTE — Telephone Encounter (Signed)
I am happy to take care of Judith Boyer. I do think hospice should continue to be involved in her care. Please call [phone] and find out why they are not going to see her anymore.

## 2015-03-03 NOTE — Telephone Encounter (Signed)
Social worker with hospice called stating that Judith Boyer is leaving hospice Monday and they are wanting to know if dr daub can still handle care   [phone] just ask for social worker

## 2015-03-04 NOTE — Telephone Encounter (Signed)
great

## 2015-03-04 NOTE — Telephone Encounter (Signed)
She remains under Hospice care, it's just going to be in her home for now on.

## 2015-03-07 NOTE — Telephone Encounter (Signed)
[place] MSW the authorization to list Dr Everlene Farrier as attending MD for Hospice, and asked the Hospice MDs to manage [facility] management care.

## 2015-03-10 ENCOUNTER — Telehealth: Payer: Self-pay

## 2015-03-10 NOTE — Telephone Encounter (Signed)
Lizina from [facility] called regarding care of the pt. They are looking for permission from Dr. Everlene Farrier for one of their physicians to "co-manage" this pt. Once this is approved, she will fax a form for him to sign. Please call [phone]

## 2015-03-12 NOTE — Telephone Encounter (Signed)
That would be great.

## 2015-03-14 NOTE — Telephone Encounter (Signed)
Spoke to [place].  She will fax form for Dr. Perfecto Kingdom signature.

## 2015-03-15 ENCOUNTER — Telehealth: Payer: Self-pay | Admitting: *Deleted

## 2015-03-15 NOTE — Telephone Encounter (Signed)
I would recommend bringing pt in to be seen.... We need to make sure there is not a reason that her BP and pulse are elevated before just prescribing medicine.

## 2015-03-15 NOTE — Telephone Encounter (Signed)
Nurse Windy Canny called and stated that pts blood pressure has been elevated and today it was 166/88.  For 2 weeks pt has had a rapid pulse 111.  She would like to know if we could call in some blood pressure medication for pt.    [facility] can be reached at [phone]  Would like a call back today.

## 2015-03-15 NOTE — Telephone Encounter (Signed)
She always has a heart rate between 110 and 130. This is related to her pulmonary disease. She does have a cardiologist and I would defer to them regarding what medications she should be on for her blood pressure.

## 2015-03-15 NOTE — Telephone Encounter (Signed)
Cyndi Bender to call pt cardiologist.  She will do so.

## 2015-03-16 ENCOUNTER — Other Ambulatory Visit: Payer: Self-pay | Admitting: *Deleted

## 2015-03-16 DIAGNOSIS — J449 Chronic obstructive pulmonary disease, unspecified: Secondary | ICD-10-CM | POA: Diagnosis not present

## 2015-03-16 MED ORDER — DILTIAZEM HCL ER COATED BEADS 180 MG PO CP24: 180.0000 mg | ORAL_CAPSULE | Freq: Every day | ORAL | Status: AC

## 2015-03-19 ENCOUNTER — Telehealth: Payer: Self-pay

## 2015-03-19 DIAGNOSIS — J449 Chronic obstructive pulmonary disease, unspecified: Secondary | ICD-10-CM | POA: Diagnosis not present

## 2015-03-19 NOTE — Telephone Encounter (Signed)
Dr Everlene Farrier, pt's ins does not cover Novolog. Called pharm to see if they can use current script from hospitalist. Pharm advised billed hospice and it paid. Nothing further needed.

## 2015-03-20 ENCOUNTER — Observation Stay (HOSPITAL_COMMUNITY)
Admission: EM | Admit: 2015-03-20 | Discharge: 2015-03-21 | Disposition: A | Discharge status: Home / Self Care | Attending: Family Medicine | Admitting: Family Medicine

## 2015-03-20 ENCOUNTER — Encounter (HOSPITAL_COMMUNITY): Payer: Self-pay | Admitting: Family Medicine

## 2015-03-20 ENCOUNTER — Emergency Department (HOSPITAL_COMMUNITY)

## 2015-03-20 DIAGNOSIS — Z9981 Dependence on supplemental oxygen: Secondary | ICD-10-CM | POA: Diagnosis not present

## 2015-03-20 DIAGNOSIS — I2510 Atherosclerotic heart disease of native coronary artery without angina pectoris: Secondary | ICD-10-CM | POA: Diagnosis not present

## 2015-03-20 DIAGNOSIS — R0602 Shortness of breath: Secondary | ICD-10-CM | POA: Diagnosis not present

## 2015-03-20 DIAGNOSIS — D649 Anemia, unspecified: Secondary | ICD-10-CM | POA: Diagnosis not present

## 2015-03-20 DIAGNOSIS — Z794 Long term (current) use of insulin: Secondary | ICD-10-CM | POA: Diagnosis not present

## 2015-03-20 DIAGNOSIS — R0600 Dyspnea, unspecified: Secondary | ICD-10-CM | POA: Diagnosis present

## 2015-03-20 DIAGNOSIS — E119 Type 2 diabetes mellitus without complications: Secondary | ICD-10-CM | POA: Diagnosis not present

## 2015-03-20 DIAGNOSIS — E162 Hypoglycemia, unspecified: Secondary | ICD-10-CM | POA: Insufficient documentation

## 2015-03-20 DIAGNOSIS — I252 Old myocardial infarction: Secondary | ICD-10-CM | POA: Diagnosis not present

## 2015-03-20 DIAGNOSIS — I10 Essential (primary) hypertension: Secondary | ICD-10-CM | POA: Diagnosis not present

## 2015-03-20 DIAGNOSIS — Z9889 Other specified postprocedural states: Secondary | ICD-10-CM | POA: Insufficient documentation

## 2015-03-20 DIAGNOSIS — Z91040 Latex allergy status: Secondary | ICD-10-CM | POA: Diagnosis not present

## 2015-03-20 DIAGNOSIS — Z87891 Personal history of nicotine dependence: Secondary | ICD-10-CM | POA: Insufficient documentation

## 2015-03-20 DIAGNOSIS — J449 Chronic obstructive pulmonary disease, unspecified: Secondary | ICD-10-CM | POA: Insufficient documentation

## 2015-03-20 DIAGNOSIS — Z515 Encounter for palliative care: Secondary | ICD-10-CM | POA: Insufficient documentation

## 2015-03-20 DIAGNOSIS — Z8701 Personal history of pneumonia (recurrent): Secondary | ICD-10-CM | POA: Diagnosis not present

## 2015-03-20 DIAGNOSIS — E7800 Pure hypercholesterolemia, unspecified: Secondary | ICD-10-CM | POA: Insufficient documentation

## 2015-03-20 DIAGNOSIS — E118 Type 2 diabetes mellitus with unspecified complications: Secondary | ICD-10-CM | POA: Insufficient documentation

## 2015-03-20 DIAGNOSIS — J441 Chronic obstructive pulmonary disease with (acute) exacerbation: Secondary | ICD-10-CM | POA: Diagnosis not present

## 2015-03-20 DIAGNOSIS — Z9861 Coronary angioplasty status: Secondary | ICD-10-CM | POA: Diagnosis not present

## 2015-03-20 DIAGNOSIS — E876 Hypokalemia: Secondary | ICD-10-CM | POA: Insufficient documentation

## 2015-03-20 LAB — CBC WITH DIFFERENTIAL/PLATELET
BASOS ABS: 0 10*3/uL (ref 0.0–0.1)
BASOS PCT: 0 %
Eosinophils Absolute: 0 10*3/uL (ref 0.0–0.7)
Eosinophils Relative: 0 %
HEMATOCRIT: 34.5 % — AB (ref 36.0–46.0)
Hemoglobin: 10.5 g/dL — ABNORMAL LOW (ref 12.0–15.0)
LYMPHS ABS: 0.8 10*3/uL (ref 0.7–4.0)
LYMPHS PCT: 6 %
MCH: 27.2 pg (ref 26.0–34.0)
MCHC: 30.4 g/dL (ref 30.0–36.0)
MCV: 89.4 fL (ref 78.0–100.0)
Monocytes Absolute: 0.3 10*3/uL (ref 0.1–1.0)
Monocytes Relative: 2 %
NEUTROS PCT: 92 %
Neutro Abs: 11.4 10*3/uL — ABNORMAL HIGH (ref 1.7–7.7)
Platelets: 312 10*3/uL (ref 150–400)
RBC: 3.86 MIL/uL — AB (ref 3.87–5.11)
RDW: 13.5 % (ref 11.5–15.5)
WBC: 12.5 10*3/uL — ABNORMAL HIGH (ref 4.0–10.5)

## 2015-03-20 LAB — BASIC METABOLIC PANEL
Anion gap: 12 (ref 5–15)
BUN: 7 mg/dL (ref 6–20)
CHLORIDE: 99 mmol/L — AB (ref 101–111)
CO2: 31 mmol/L (ref 22–32)
CREATININE: 0.4 mg/dL — AB (ref 0.44–1.00)
Calcium: 8.9 mg/dL (ref 8.9–10.3)
GFR calc non Af Amer: >60 mL/min (ref >60)
Glucose, Bld: 82 mg/dL (ref 65–99)
POTASSIUM: 3.2 mmol/L — AB (ref 3.5–5.1)
SODIUM: 142 mmol/L (ref 135–145)

## 2015-03-20 LAB — CBG MONITORING, ED: GLUCOSE-CAPILLARY: 62 mg/dL — AB (ref 65–99)

## 2015-03-20 LAB — I-STAT VENOUS BLOOD GAS, ED
Acid-Base Excess: 9 mmol/L — ABNORMAL HIGH (ref 0.0–2.0)
Bicarbonate: 35.9 mEq/L — ABNORMAL HIGH (ref 20.0–24.0)
O2 SAT: 84 %
PCO2 VEN: 60.4 mmHg — AB (ref 45.0–50.0)
PO2 VEN: 52 mmHg — AB (ref 30.0–45.0)
TCO2: 38 mmol/L (ref 0–100)
pH, Ven: 7.382 — ABNORMAL HIGH (ref 7.250–7.300)

## 2015-03-20 LAB — I-STAT TROPONIN, ED: TROPONIN I, POC: 0.01 ng/mL (ref 0.00–0.08)

## 2015-03-20 LAB — GLUCOSE, CAPILLARY
GLUCOSE-CAPILLARY: 241 mg/dL — AB (ref 65–99)
Glucose-Capillary: 248 mg/dL — ABNORMAL HIGH (ref 65–99)

## 2015-03-20 MED ORDER — LORAZEPAM 0.5 MG PO TABS
0.5000 mg | ORAL_TABLET | Freq: Three times a day (TID) | ORAL | Status: DC | PRN
  Administered 2015-03-21: 1 mg via ORAL
  Administered 2015-03-21: 0.5 mg via ORAL
  Filled 2015-03-20: qty 2
  Filled 2015-03-20 (×2): qty 1

## 2015-03-20 MED ORDER — SENNOSIDES-DOCUSATE SODIUM 8.6-50 MG PO TABS
1.0000 | ORAL_TABLET | Freq: Two times a day (BID) | ORAL | Status: DC
  Administered 2015-03-20 – 2015-03-21 (×2): 1 via ORAL
  Filled 2015-03-20 (×2): qty 1

## 2015-03-20 MED ORDER — INSULIN GLARGINE 100 UNIT/ML ~~LOC~~ SOLN
10.0000 [IU] | Freq: Every day | SUBCUTANEOUS | Status: DC
  Administered 2015-03-21: 10 [IU] via SUBCUTANEOUS
  Filled 2015-03-20: qty 0.1

## 2015-03-20 MED ORDER — METHYLPREDNISOLONE SODIUM SUCC 125 MG IJ SOLR
125.0000 mg | Freq: Once | INTRAMUSCULAR | Status: AC
  Administered 2015-03-20: 125 mg via INTRAVENOUS
  Filled 2015-03-20: qty 2

## 2015-03-20 MED ORDER — PANTOPRAZOLE SODIUM 40 MG PO TBEC
40.0000 mg | DELAYED_RELEASE_TABLET | Freq: Every day | ORAL | Status: DC
  Administered 2015-03-21: 40 mg via ORAL
  Filled 2015-03-20: qty 1

## 2015-03-20 MED ORDER — GLUCERNA SHAKE PO LIQD
237.0000 mL | Freq: Three times a day (TID) | ORAL | Status: DC
  Administered 2015-03-20: 237 mL via ORAL

## 2015-03-20 MED ORDER — SODIUM CHLORIDE 0.9% FLUSH
3.0000 mL | Freq: Two times a day (BID) | INTRAVENOUS | Status: DC
  Administered 2015-03-20 – 2015-03-21 (×2): 3 mL via INTRAVENOUS

## 2015-03-20 MED ORDER — ALBUTEROL SULFATE (2.5 MG/3ML) 0.083% IN NEBU
2.5000 mg | INHALATION_SOLUTION | Freq: Once | RESPIRATORY_TRACT | Status: AC
Start: 2015-03-20 — End: 2015-03-20
  Administered 2015-03-20: 2.5 mg via RESPIRATORY_TRACT
  Filled 2015-03-20: qty 3

## 2015-03-20 MED ORDER — MELATONIN 3 MG PO TABS
3.0000 mg | ORAL_TABLET | Freq: Every day | ORAL | Status: DC
  Administered 2015-03-20: 3 mg via ORAL
  Filled 2015-03-20 (×2): qty 1

## 2015-03-20 MED ORDER — LORAZEPAM 1 MG PO TABS
0.5000 mg | ORAL_TABLET | Freq: Once | ORAL | Status: AC
  Administered 2015-03-20: 0.5 mg via ORAL
  Filled 2015-03-20: qty 1

## 2015-03-20 MED ORDER — SODIUM CHLORIDE 0.9% FLUSH: 3.0000 mL | Freq: Two times a day (BID) | INTRAVENOUS | Status: DC

## 2015-03-20 MED ORDER — AZITHROMYCIN 500 MG PO TABS
250.0000 mg | ORAL_TABLET | Freq: Every day | ORAL | Status: DC
  Administered 2015-03-21: 250 mg via ORAL
  Filled 2015-03-20: qty 1

## 2015-03-20 MED ORDER — AZITHROMYCIN 500 MG PO TABS
500.0000 mg | ORAL_TABLET | Freq: Every day | ORAL | Status: AC
  Administered 2015-03-20: 500 mg via ORAL
  Filled 2015-03-20: qty 1

## 2015-03-20 MED ORDER — INSULIN ASPART 100 UNIT/ML ~~LOC~~ SOLN
0.0000 [IU] | Freq: Three times a day (TID) | SUBCUTANEOUS | Status: DC
  Administered 2015-03-21: 5 [IU] via SUBCUTANEOUS
  Administered 2015-03-21: 7 [IU] via SUBCUTANEOUS

## 2015-03-20 MED ORDER — ACETAMINOPHEN 650 MG RE SUPP: 650.0000 mg | Freq: Four times a day (QID) | RECTAL | Status: DC | PRN

## 2015-03-20 MED ORDER — GUAIFENESIN ER 600 MG PO TB12
1200.0000 mg | ORAL_TABLET | Freq: Two times a day (BID) | ORAL | Status: DC
  Administered 2015-03-20 – 2015-03-21 (×2): 1200 mg via ORAL
  Filled 2015-03-20 (×2): qty 2

## 2015-03-20 MED ORDER — MONTELUKAST SODIUM 10 MG PO TABS
10.0000 mg | ORAL_TABLET | Freq: Every day | ORAL | Status: DC
  Administered 2015-03-20 – 2015-03-21 (×2): 10 mg via ORAL
  Filled 2015-03-20 (×2): qty 1

## 2015-03-20 MED ORDER — SODIUM CHLORIDE 0.9 % IV BOLUS (SEPSIS)
250.0000 mL | Freq: Once | INTRAVENOUS | Status: AC
  Administered 2015-03-20: 250 mL via INTRAVENOUS

## 2015-03-20 MED ORDER — PREDNISONE 20 MG PO TABS
40.0000 mg | ORAL_TABLET | ORAL | Status: DC
  Administered 2015-03-21: 40 mg via ORAL
  Filled 2015-03-20: qty 2

## 2015-03-20 MED ORDER — DILTIAZEM HCL 60 MG PO TABS
60.0000 mg | ORAL_TABLET | Freq: Three times a day (TID) | ORAL | Status: DC
  Administered 2015-03-20 – 2015-03-21 (×2): 60 mg via ORAL
  Filled 2015-03-20 (×3): qty 1

## 2015-03-20 MED ORDER — SODIUM CHLORIDE 0.9% FLUSH
3.0000 mL | INTRAVENOUS | Status: DC | PRN
  Administered 2015-03-21: 3 mL via INTRAVENOUS
  Filled 2015-03-20: qty 3

## 2015-03-20 MED ORDER — ALBUTEROL SULFATE (2.5 MG/3ML) 0.083% IN NEBU: 2.5000 mg | INHALATION_SOLUTION | RESPIRATORY_TRACT | Status: DC | PRN

## 2015-03-20 MED ORDER — ARFORMOTEROL TARTRATE 15 MCG/2ML IN NEBU
15.0000 ug | INHALATION_SOLUTION | Freq: Two times a day (BID) | RESPIRATORY_TRACT | Status: DC
  Administered 2015-03-20 – 2015-03-21 (×2): 15 ug via RESPIRATORY_TRACT
  Filled 2015-03-20 (×2): qty 2

## 2015-03-20 MED ORDER — ACETAMINOPHEN 325 MG PO TABS
650.0000 mg | ORAL_TABLET | Freq: Four times a day (QID) | ORAL | Status: DC | PRN
Start: 2015-03-20 — End: 2015-03-21

## 2015-03-20 MED ORDER — POLYETHYLENE GLYCOL 3350 17 G PO PACK
17.0000 g | PACK | Freq: Every day | ORAL | Status: DC | PRN
Start: 2015-03-20 — End: 2015-03-21

## 2015-03-20 MED ORDER — SODIUM CHLORIDE 0.9 % IV SOLN: 250.0000 mL | INTRAVENOUS | Status: DC | PRN

## 2015-03-20 MED ORDER — HALOPERIDOL 5 MG PO TABS: 5.0000 mg | ORAL_TABLET | ORAL | Status: DC | PRN

## 2015-03-20 MED ORDER — IPRATROPIUM-ALBUTEROL 0.5-2.5 (3) MG/3ML IN SOLN
3.0000 mL | Freq: Once | RESPIRATORY_TRACT | Status: AC
  Administered 2015-03-20: 3 mL via RESPIRATORY_TRACT
  Filled 2015-03-20: qty 3

## 2015-03-20 MED ORDER — POTASSIUM CHLORIDE CRYS ER 20 MEQ PO TBCR
40.0000 meq | EXTENDED_RELEASE_TABLET | Freq: Once | ORAL | Status: AC
  Administered 2015-03-20: 40 meq via ORAL
  Filled 2015-03-20: qty 2

## 2015-03-20 MED ORDER — ENOXAPARIN SODIUM 30 MG/0.3ML ~~LOC~~ SOLN: 30.0000 mg | SUBCUTANEOUS | Status: DC

## 2015-03-20 MED ORDER — IPRATROPIUM-ALBUTEROL 0.5-2.5 (3) MG/3ML IN SOLN
3.0000 mL | Freq: Four times a day (QID) | RESPIRATORY_TRACT | Status: DC
  Administered 2015-03-20 – 2015-03-21 (×4): 3 mL via RESPIRATORY_TRACT
  Filled 2015-03-20 (×4): qty 3

## 2015-03-20 MED ORDER — OXYCODONE HCL 5 MG PO TABS: 5.0000 mg | ORAL_TABLET | ORAL | Status: DC | PRN

## 2015-03-20 NOTE — Progress Notes (Signed)
Family Medicine Teaching Service Daily Progress Note Intern Pager: [phone]  Patient name: GEETA SARTORI Medical record number: [id number] Date of birth: 01/05/1946 Age: 70 y.o. Gender: female  Primary Care Provider: Jenny Reichmann, MD Consultants: None Code Status: DNR  Pt Overview and Major Events to Date:  2/12: Admit to inpatient FPTS  Assessment and Plan: BIRDINE HANDLEY is a 70 y.o. female presenting with dyspnea . PMH is significant for COPD, Cath s/p stent placement, DM2  Dyspnea: Improved. On home 3 L O2 [place] - prednisone 40mg  daily for 5 day course - Azithromycin Day 2. Will do 5 day course. QTc slightly prolonged; will need to watch - duoneb q6hrs - albuterol neb q4hrs prn - continue home O2 at 3L; keep O2 greater than 88%  Episodes of shaking: unlikely seizure since patient is alert during episodes. Possible related to hypoglycemia as glucose today is 62 and patient takes Lantus 25u daily with sliding scale. - watch overnight - Resume lantus, start at 10 units. Will likely DC on 15 or 20 units of Lantus  COPD: severe, on hospice. Previously inpatient hospice and now transitioned to home hospice. On daily prednisone 20mg  - management as above - continue Brovana neb BID - oxycodone 5mg  q4 hrs prn agitation/dyspnea - haldol 5mg  q6hrs prn for agitation  Diabetes mellitus: last A1C of 7.2 in 01/2015. Blood sugar of 62 initially in the ED - hold lantus - SSI - Will need to reevaluate outpatient management on discharge. Would likely benefit more from an oral agent  Hypokalemia: Resolved - Daily BMP  Palpitations: unsure if history of atrial fibrillation. Could not find anything on chart review. Currently on diltiazem 180mg  24hr daily. Still having palpitations - switch to diltiazem 60mg  TID  FEN/GI: Heart healthy/carb modified Prophylaxis: Lovenox, protonix  Disposition: Discharge today likely  Subjective:  - No acute events overnight - Pt stable on home  3 L O2 - Still complaining of palpitations - Denies shortness of breath, chest pain, or palpitations  Objective: Temp:  [98.2 F (36.8 C)-99.4 F (37.4 C)] 99.4 F (37.4 C) (02/13 0532) Pulse Rate:  [84-105] 103 (02/13 0532) Resp:  [14-23] 23 (02/13 0532) BP: (117-172)/(46-81) 117/46 mmHg (02/13 0532) SpO2:  [98 %-100 %] 99 % (02/13 0532) Weight:  [96 lb (43.545 kg)-105 lb (47.628 kg)] 105 lb (47.628 kg) (02/12 2009) Physical Exam: General: In NAD, sitting up on the side of the bed eating breakfast, frail appearing with decreased muscle mass Cardiovascular: RRR, normal s1 and s2. Systolic murmur present Respiratory: normal work of breathing, clear to auscultation bilaterally  Abdomen: soft, non distended, non tender Extremities: no edema  Laboratory:  Recent Labs Lab 03/20/15 1353  WBC 12.5*  HGB 10.5*  HCT 34.5*  PLT 312    Recent Labs Lab 03/20/15 1353 03/21/15 0540  NA 142 138  K 3.2* 4.2  CL 99* 100*  CO2 31 28  BUN 7 11  CREATININE 0.40* 0.71  CALCIUM 8.9 9.4  GLUCOSE 82 398*     Imaging/Diagnostic Tests: Dg Chest 2 View  03/20/2015  CLINICAL DATA:  Acute shortness of breath. EXAM: CHEST  2 VIEW COMPARISON:  02/01/2015 and prior exams FINDINGS: Cardiomegaly and COPD/ emphysema again identified. There is no evidence of focal airspace disease, pulmonary edema, suspicious pulmonary nodule/mass, pleural effusion, or pneumothorax. No acute bony abnormalities are identified. IMPRESSION: No evidence of acute cardiopulmonary disease. Cardiomegaly and COPD/emphysema. Electronically Signed   By: Margarette Canada M.D.   On: 03/20/2015 12:52  Carlyle Dolly, MD 03/20/2015, 10:43 PM PGY-1, [place] Intern pager: [phone], text pages welcome

## 2015-03-20 NOTE — H&P (Signed)
[facility] Admission History and Physical Service Pager: [phone]  Patient name: Judith Boyer Medical record number: [id number] Date of birth: 02/29/1945 Age: 70 y.o. Gender: female  Primary Care Provider: Jenny Reichmann, MD Consultants: None Code Status: DNR  Chief Complaint: Dyspnea  Assessment and Plan: Judith Boyer is a 70 y.o. female presenting with dyspnea . PMH is significant for COPD, Cath s/p stent placement, DM2  Dyspnea: Likely secondary to COPD. Murmur noticed on exam. Last echo in 02/2013 significant for an EF of 55-60%. Overall does not appear fluid overloaded. Does not fully meet classification for COPD exacerbation, however, since she is failing nebulizer management, will treat as such. S/p duoneb x1 in the ED and solu-medrol x1. VBG significant for slight alkalosis with hypercapnia and compensated metabolic alkalosis.  - Observation, attending Dr. Gwendlyn Deutscher, telemetry for palpitations - prednisone 40mg  daily - Azithromycin 500mg  today, then 250mg  daily for 4 days starting tomorrow. QTc slightly prolonged; will need to watch - duoneb q6hrs - albuterol neb q4hrs prn - continue home O2 at 3L; keep O2 greater than 88%  Episodes of shaking: unlikely seizure since patient is alert during episodes. Possible related to hypoglycemia as glucose today is 62 and patient takes Lantus 25u daily with sliding scale. - watch overnight - hold lantus  COPD: severe, on hospice. Previously inpatient hospice and now transitioned to home hospice. On daily prednisone 20mg  - management as above - continue Brovana neb BID - oxycodone 5mg  q4 hrs prn agitation/dyspnea - haldol 5mg  q6hrs prn for agitation  Diabetes mellitus: last A1C of 7.2 in 01/2015. Blood sugar of 62 initially in the ED - hold lantus - SSI - Will need to reevaluate outpatient management on discharge. Would likely benefit more from an oral agent  Hypokalemia: 3.2 on admission -  potassium 63meq x1  Palpitations: unsure if history of atrial fibrillation. Could not find anything on chart review. Currently on diltiazem 180mg  24hr daily - switch to diltiazem 60mg  TID  FEN/GI: Heart healthy/carb modified Prophylaxis: Lovenox, protonix  Disposition: observation, likely discharge in AM  History of Present Illness:  Judith Boyer is a 70 y.o. female presenting with dyspnea. She has dyspnea at baseline which has worsened in the past three days. No increased coughing or sputum production. She has been using her albuterol nebulizer more than usual which has not been helping much lately. She reports no associated headaches or chest pain but does have palpitations intermittently at night. No fevers, rhinorrhea, sneezing, nausea, vomiting, or abdominal pain. She uses 3L of O2 at home which has remained at baseline. Her son brought her to the emergency in part for the dyspnea and also because he reports episodes where his mother has shaky movements at night for which she is conscious.   Review Of Systems: Per HPI with the following additions:  Otherwise the remainder of the systems were negative.  Patient Active Problem List   Diagnosis Date Noted  . SOB (shortness of breath)   . Acute respiratory failure ([place]) 01/30/2015  . HCAP (healthcare-associated pneumonia) 01/30/2015  . DNR (do not resuscitate) discussion 12/29/2014  . Acute on chronic respiratory failure with hypoxia and hypercapnia (HCC)   . Hyperkalemia 12/25/2014  . Tobacco abuse   . Noncompliance   . Acute on chronic respiratory failure with hypoxia ([employer])   . Acute exacerbation of chronic obstructive pulmonary disease (COPD) ([place])   . Diabetes mellitus with complication ([place])   . Chronic respiratory failure with hypercapnia (  [place]) 09/24/2014  . Palliative care by specialist   . Dysphagia   . Dyspnea 08/13/2014  . Tachycardia   . Respiratory acidosis   . Coronary artery disease involving native coronary  artery of native heart without angina pectoris   . Anxiety   . B12 deficiency 02/10/2014  . Protein-calorie malnutrition, severe ([place]) 02/09/2014  . Acute respiratory distress (HCC) 02/08/2014  . Vitamin B 12 deficiency 06/28/2013  . Palliative care encounter 02/25/2013  . Weakness generalized 02/25/2013  . COPD exacerbation ([place]) 02/20/2013  . Anemia 02/20/2013  . Nocturnal hypoxemia 12/30/2012  . COPD GOLD III 02/06/2011  . Smoker 02/06/2011  . Hypertension 02/06/2011  . Diabetes mellitus type 2, controlled, without complications ([place]) [id number]    Past Medical History: Past Medical History  Diagnosis Date  . COPD (chronic obstructive pulmonary disease) ([place])   . Hypertension   . Coronary artery disease   . Shortness of breath   . High cholesterol   . Myocardial infarction ([place]) 02/2011  . On home oxygen therapy     "2L; 24h/day" (12/20/2014)  . Pneumonia     "couple times" (02/08/2014)  . Type II diabetes mellitus ([place])     type 2  . Anemia   . History of blood transfusion 2014    "extremely anemic"    Past Surgical History: Past Surgical History  Procedure Laterality Date  . Shoulder arthroscopy w/ rotator cuff repair Left   . Left heart catheterization with coronary angiogram N/A 02/09/2011    Procedure: LEFT HEART CATHETERIZATION WITH CORONARY ANGIOGRAM;  Surgeon: Birdie Riddle, MD;  Location: [facility] CATH LAB;  Service: Cardiovascular;  Laterality: N/A;  . Tonsillectomy    . Abdominal hysterectomy  1970's  . Coronary angioplasty with stent placement  12/2007    "2"  . Cardiac catheterization  02/2011    Social History: Social History  Substance Use Topics  . Smoking status: Former Smoker -- 1.00 packs/day for 50 years    Types: Cigarettes    Quit date: 01/10/2013  . Smokeless tobacco: Never Used  . Alcohol Use: No   Additional social history:   Please also refer to relevant sections of EMR.  Family History: Family History  Problem Relation Age of Onset   . Heart disease Maternal Grandmother   . Brain cancer Brother   . Lung cancer Mother     smoked  . Diabetes Mother   . Diabetes Brother    Allergies and Medications: Allergies  Allergen Reactions  . Doxycycline Anaphylaxis  . Codeine Other (See Comments)    hyperactivity  . Dulera [Mometasone Furo-Formoterol Fum] Hives and Rash  . Oxycodone Other (See Comments)    Zonked out on 10 mg,  Pt can tolerate 5 mg  . Adhesive [Tape] Rash  . Latex Rash  . Lisinopril Cough   Current Facility-Administered Medications on File Prior to Encounter  Medication Dose Route Frequency Provider Last Rate Last Dose  . cyanocobalamin ((VITAMIN B-12)) injection 1,000 mcg  1,000 mcg Intramuscular Q30 days Dorian Heckle English, PA   1,000 mcg at 02/22/14 1029   Current Outpatient Prescriptions on File Prior to Encounter  Medication Sig Dispense Refill  . acetaminophen (TYLENOL) 325 MG tablet Take 2 tablets (650 mg total) by mouth every 6 (six) hours as needed for mild pain (or Fever >/= 101).    Marland Kitchen albuterol (PROAIR HFA) 108 (90 BASE) MCG/ACT inhaler Inhale 2 puffs into the lungs every 4 (four) hours as needed for wheezing or shortness  of breath.    Marland Kitchen arformoterol (BROVANA) 15 MCG/2ML NEBU Take 2 mLs (15 mcg total) by nebulization 2 (two) times daily. 120 mL   . diltiazem (CARDIZEM CD) 180 MG 24 hr capsule Take 1 capsule (180 mg total) by mouth daily. 30 capsule 5  . feeding supplement, GLUCERNA SHAKE, (GLUCERNA SHAKE) LIQD Take 237 mLs by mouth 3 (three) times daily between meals. (Patient taking differently: Take 237 mLs by mouth daily. )  0  . Guaifenesin (MUCINEX MAXIMUM STRENGTH) 1200 MG TB12 Take 1,200 mg by mouth 2 (two) times daily.    . insulin aspart (NOVOLOG) 100 UNIT/ML injection Before each meal 3 times a day, 140-199 - 2 units, 200-250 - 4 units, 251-299 - 6 units,  300-349 - 8 units,  350 or above 10 units. Dispense syringes and needles as needed, Ok to switch to PEN if approved. Substitute to  any brand approved. DX DM2, Code E11.65 (Patient taking differently: Inject into the skin 3 (three) times daily with meals. Before each meal 3 times a day, 140-199 - 2 units, 200-250 - 4 units, 251-299 - 6 units,  300-349 - 8 units,  350 or above 10 units. Dispense syringes and needles as needed, Ok to switch to PEN if approved. Substitute to any brand approved. DX DM2, Code E11.65) 1 vial 12  . insulin glargine (LANTUS) 100 UNIT/ML injection Inject 0.25 mLs (25 Units total) into the skin daily. Dispense insulin pen if approved, if not dispense as needed syringes and needles for 1 month supply. Can switch to Levemir. Diagnosis E 11.65. 10 mL 0  . ipratropium-albuterol (DUONEB) 0.5-2.5 (3) MG/3ML SOLN Take 3 mLs by nebulization 4 (four) times daily - after meals and at bedtime. 360 mL   . Melatonin 3 MG TABS Take 3 mg by mouth at bedtime.    . montelukast (SINGULAIR) 10 MG tablet take 1 tablet by mouth once daily (Patient taking differently: take 10 mg by mouth daily with supper) 30 tablet 5  . nitroGLYCERIN (NITROSTAT) 0.4 MG SL tablet Place 1 tablet (0.4 mg total) under the tongue every 5 (five) minutes x 3 doses as needed for chest pain. 25 tablet 1  . OXYGEN Inhale 3 L into the lungs continuous.     . pantoprazole (PROTONIX) 40 MG tablet Take 40 mg by mouth daily.     . polyethylene glycol (MIRALAX / GLYCOLAX) packet Take 17 g by mouth daily as needed (constipation).    Marland Kitchen senna-docusate (SENOKOT-S) 8.6-50 MG tablet Take 2 tablets by mouth at bedtime. (Patient taking differently: Take 1 tablet by mouth 2 (two) times daily. )    . bisacodyl (DULCOLAX) 10 MG suppository Place 1 suppository (10 mg total) rectally daily as needed for moderate constipation. (Patient not taking: Reported on 01/31/2015) 12 suppository 0  . LORazepam (ATIVAN) 2 MG/ML concentrated solution Take 0.5 mLs (1 mg total) by mouth every 6 (six) hours as needed for anxiety. (Patient not taking: Reported on 03/20/2015) 30 mL 0  .  Morphine Sulfate (MORPHINE CONCENTRATE) 10 MG/0.5ML SOLN concentrated solution Take 0.5 mLs (10 mg total) by mouth every 3 (three) hours as needed for moderate pain or severe pain. (Patient not taking: Reported on 03/20/2015) 30 mL 0  . predniSONE (DELTASONE) 5 MG tablet Label  & dispense according to the schedule below. 10 Pills PO for 3 days then, 8 Pills PO for 3 days, 6 Pills PO for 3 days, then take 4 pills daily as before this is your  home dose of 20 mg of prednisone daily. (Patient not taking: Reported on 03/20/2015) 95 tablet 0  . sodium chloride (OCEAN) 0.65 % SOLN nasal spray Place 1 spray into both nostrils as needed for congestion. (Patient not taking: Reported on 03/20/2015)  0    Objective: BP 152/60 mmHg  Pulse 103  Temp(Src) 98.5 F (36.9 C)  Resp 23  Ht 5\' 1"  (1.549 m)  Wt 96 lb (43.545 kg)  BMI 18.15 kg/m2  SpO2 99%  Exam: General: Fair appearing, no distress, [place] in place, easily falls asleep Eyes: EOMI, PERRL ENTM: Oropharynx clear and moist Neck: Supply Cardiovascular: Regular rate and rhythm. Normal S1 and S2. Systolic heart murmur present and heard best at apex. No extra heart sounds Respiratory: Clear to auscultation bilaterally with diminished breath sounds throughtout. Unlabored work of breathing. No wheezing or rales. Abdomen: Soft, non-tender, non-distended, no guarding, no rebound, no masses felt MSK: No calf tenderness or swelling Skin: No obvious rashes Neuro: Alert, oriented x3, very slight resting tremor noticed in left hand Psych: Slightly flat affect  Labs and Imaging: CBC BMET   Recent Labs Lab 03/20/15 1353  WBC 12.5*  HGB 10.5*  HCT 34.5*  PLT 312    Recent Labs Lab 03/20/15 1353  NA 142  K 3.2*  CL 99*  CO2 31  BUN 7  CREATININE 0.40*  GLUCOSE 82  CALCIUM 8.9     Dg Chest 2 View  03/20/2015  CLINICAL DATA:  Acute shortness of breath. EXAM: CHEST  2 VIEW COMPARISON:  02/01/2015 and prior exams FINDINGS: Cardiomegaly and COPD/  emphysema again identified. There is no evidence of focal airspace disease, pulmonary edema, suspicious pulmonary nodule/mass, pleural effusion, or pneumothorax. No acute bony abnormalities are identified. IMPRESSION: No evidence of acute cardiopulmonary disease. Cardiomegaly and COPD/emphysema. Electronically Signed   By: Margarette Canada M.D.   On: 03/20/2015 12:52    Mariel Aloe, MD 03/20/2015, 4:18 PM PGY-3, [place] Intern pager: [phone], text pages welcome

## 2015-03-20 NOTE — ED Notes (Signed)
Per EMS-  Pt began feeling short of breathe one hour ago. PT used her at home breathing treatment with no relief but reports it did make her heart feel jittery. PT is on hospice, lives at home with son. Pt wears 3L O2 at home continuously.

## 2015-03-20 NOTE — Care Management Note (Addendum)
Case Management Note  Patient Details  Name: Judith Boyer MRN: [id number] Date of Birth: [birth year]  Subjective/Objective:    CM/CSW consulted by EDP for assist with this 70 y.o. F seen in the ED for COPD exacerbation. [name], adult son, brought her to the ED to have her evaluated as she was discharged from [facility] 2 weeks ago and has been home with Hospice care since that time. Reports she has episodes of "shaking" and anxiety at night that cause  him great concern. He "just wants a medical person to assess these episodes" as he has a difficult time communicating his concern to the Hospice RN assigned to his case, as she is foreign born.              Action/Plan: Senaida Lange that hospitalist will admit if needed but that pt will most likely need to be discharged home with a resumption of Hospice in home. [name] is on board with this and agreeable with this plan. Will defer to Inpatient CM. No further needs at this time. [name], MD aware of above   Expected Discharge Date:                  Expected Discharge Plan:  Home w Hospice Care  In-House Referral:  Clinical Social Work  Discharge planning Services  CM Consult  Post Acute Care Choice:    Choice offered to:  Adult Children  DME Arranged:    DME Agency:     HH Arranged:    HH Agency:     Status of Service:  In process, will continue to follow  Medicare Important Message Given:    Date Medicare IM Given:    Medicare IM give by:    Date Additional Medicare IM Given:    Additional Medicare Important Message give by:     If discussed at [place] of Stay Meetings, dates discussed:    Additional Comments:  Delrae Sawyers, RN 03/20/2015, 4:25 PM

## 2015-03-20 NOTE — ED Provider Notes (Signed)
CSN: [id number]     Arrival date & time 03/20/15  1154 History   First MD Initiated Contact with Patient 03/20/15 1234     Chief Complaint  Patient presents with  . Shortness of Breath     Patient is a 70 y.o. female presenting with shortness of breath. The history is provided by the patient and a relative. No language interpreter was used.  Shortness of Breath  Judith Boyer is a 70 y.o. female who presents to the Emergency Department complaining of SOB.  She has a history of COPD on 3 L home oxygen here with increased shortness of breath and chest discomfort for the last 3-4 days. She reports the discomfort and racing feeling in her chest for several days. She has increased shortness of breath with cough. No fevers, lower extremity edema. She is taking her medications at home as prescribed with no improvement in her symptoms. She is on hospice for her COPD. She did not contact them today for the symptoms. She reports feeling very sleepy for the last couple of days. Son reports that she also is very jittery and anxious at home and tremors significantly at night.  Past Medical History  Diagnosis Date  . COPD (chronic obstructive pulmonary disease) ([place])   . Hypertension   . Coronary artery disease   . Shortness of breath   . High cholesterol   . Myocardial infarction ([place]) 02/2011  . On home oxygen therapy     "2L; 24h/day" (12/20/2014)  . Pneumonia     "couple times" (02/08/2014)  . Type II diabetes mellitus ([place])     type 2  . Anemia   . History of blood transfusion 2014    "extremely anemic"   Past Surgical History  Procedure Laterality Date  . Shoulder arthroscopy w/ rotator cuff repair Left   . Left heart catheterization with coronary angiogram N/A 02/09/2011    Procedure: LEFT HEART CATHETERIZATION WITH CORONARY ANGIOGRAM;  Surgeon: Birdie Riddle, MD;  Location: [facility];  Service: Cardiovascular;  Laterality: N/A;  . Tonsillectomy    . Abdominal hysterectomy  1970's   . Coronary angioplasty with stent placement  12/2007    "2"  . Cardiac catheterization  02/2011   Family History  Problem Relation Age of Onset  . Heart disease Maternal Grandmother   . Brain cancer Brother   . Lung cancer Mother     smoked  . Diabetes Mother   . Diabetes Brother    Social History  Substance Use Topics  . Smoking status: Former Smoker -- 1.00 packs/day for 50 years    Types: Cigarettes    Quit date: 01/10/2013  . Smokeless tobacco: Never Used  . Alcohol Use: No   OB History    No data available     Review of Systems  Respiratory: Positive for shortness of breath.   All other systems reviewed and are negative.     Allergies  Doxycycline; Codeine; Dulera; Oxycodone; Adhesive; Latex; and Lisinopril  Home Medications   Prior to Admission medications   Medication Sig Start Date End Date Taking? Authorizing Provider  acetaminophen (TYLENOL) 325 MG tablet Take 2 tablets (650 mg total) by mouth every 6 (six) hours as needed for mild pain (or Fever >/= 101). 12/31/14  Yes Albertine Patricia, MD  albuterol (PROAIR HFA) 108 (90 BASE) MCG/ACT inhaler Inhale 2 puffs into the lungs every 4 (four) hours as needed for wheezing or shortness of breath.   Yes  Historical Provider, MD  arformoterol (BROVANA) 15 MCG/2ML NEBU Take 2 mLs (15 mcg total) by nebulization 2 (two) times daily. 12/31/14  Yes Albertine Patricia, MD  atorvastatin (LIPITOR) 10 MG tablet Take 10 mg by mouth daily. 01/26/15  Yes Historical Provider, MD  diltiazem (CARDIZEM CD) 180 MG 24 hr capsule Take 1 capsule (180 mg total) by mouth daily. 03/16/15  Yes [name], MD  feeding supplement, GLUCERNA SHAKE, (GLUCERNA SHAKE) LIQD Take 237 mLs by mouth 3 (three) times daily between meals. Patient taking differently: Take 237 mLs by mouth daily.  12/31/14  Yes Silver Huguenin Elgergawy, MD  Guaifenesin (MUCINEX MAXIMUM STRENGTH) 1200 MG TB12 Take 1,200 mg by mouth 2 (two) times daily.   Yes Historical Provider,  MD  haloperidol (HALDOL) 5 MG tablet Take 5 mg by mouth every 4 (four) hours as needed for agitation (for anxiety or nausea).  03/07/15  Yes Historical Provider, MD  insulin aspart (NOVOLOG) 100 UNIT/ML injection Before each meal 3 times a day, 140-199 - 2 units, 200-250 - 4 units, 251-299 - 6 units,  300-349 - 8 units,  350 or above 10 units. Dispense syringes and needles as needed, Ok to switch to PEN if approved. Substitute to any brand approved. DX DM2, Code E11.65 Patient taking differently: Inject into the skin 3 (three) times daily with meals. Before each meal 3 times a day, 140-199 - 2 units, 200-250 - 4 units, 251-299 - 6 units,  300-349 - 8 units,  350 or above 10 units. Dispense syringes and needles as needed, Ok to switch to PEN if approved. Substitute to any brand approved. DX DM2, Code E11.65 02/05/15  Yes Thurnell Lose, MD  insulin glargine (LANTUS) 100 UNIT/ML injection Inject 0.25 mLs (25 Units total) into the skin daily. Dispense insulin pen if approved, if not dispense as needed syringes and needles for 1 month supply. Can switch to Levemir. Diagnosis E 11.65. 02/05/15  Yes Thurnell Lose, MD  ipratropium-albuterol (DUONEB) 0.5-2.5 (3) MG/3ML SOLN Take 3 mLs by nebulization 4 (four) times daily - after meals and at bedtime. 12/31/14  Yes Silver Huguenin Elgergawy, MD  LORazepam (ATIVAN) 0.5 MG tablet Take 0.5-1 mg by mouth every 8 (eight) hours as needed for anxiety.  03/07/15  Yes Historical Provider, MD  Melatonin 3 MG TABS Take 3 mg by mouth at bedtime.   Yes Historical Provider, MD  montelukast (SINGULAIR) 10 MG tablet take 1 tablet by mouth once daily Patient taking differently: take 10 mg by mouth daily with supper 07/09/14  Yes [name], MD  nitroGLYCERIN (NITROSTAT) 0.4 MG SL tablet Place 1 tablet (0.4 mg total) under the tongue every 5 (five) minutes x 3 doses as needed for chest pain. 02/07/12  Yes Dixie Dials, MD  Oxycodone HCl 10 MG TABS Take 10 mg by mouth every 4 (four)  hours as needed (for pain or shortness of breath).  03/07/15  Yes Historical Provider, MD  OXYGEN Inhale 3 L into the lungs continuous.    Yes Historical Provider, MD  pantoprazole (PROTONIX) 40 MG tablet Take 40 mg by mouth daily.    Yes Historical Provider, MD  polyethylene glycol (MIRALAX / GLYCOLAX) packet Take 17 g by mouth daily as needed (constipation).   Yes Historical Provider, MD  predniSONE (DELTASONE) 10 MG tablet Take 20 mg by mouth every morning. 12/19/14  Yes Historical Provider, MD  QUEtiapine (SEROQUEL) 25 MG tablet Take 25 mg by mouth at bedtime.  03/07/15  Yes Historical Provider, MD  senna-docusate (SENOKOT-S) 8.6-50 MG tablet Take 2 tablets by mouth at bedtime. Patient taking differently: Take 1 tablet by mouth 2 (two) times daily.  12/31/14  Yes Albertine Patricia, MD  bisacodyl (DULCOLAX) 10 MG suppository Place 1 suppository (10 mg total) rectally daily as needed for moderate constipation. Patient not taking: Reported on 01/31/2015 12/31/14   Silver Huguenin Elgergawy, MD  LORazepam (ATIVAN) 2 MG/ML concentrated solution Take 0.5 mLs (1 mg total) by mouth every 6 (six) hours as needed for anxiety. Patient not taking: Reported on 03/20/2015 02/05/15   Thurnell Lose, MD  Morphine Sulfate (MORPHINE CONCENTRATE) 10 MG/0.5ML SOLN concentrated solution Take 0.5 mLs (10 mg total) by mouth every 3 (three) hours as needed for moderate pain or severe pain. Patient not taking: Reported on 03/20/2015 02/05/15   Thurnell Lose, MD  predniSONE (DELTASONE) 5 MG tablet Label  & dispense according to the schedule below. 10 Pills PO for 3 days then, 8 Pills PO for 3 days, 6 Pills PO for 3 days, then take 4 pills daily as before this is your home dose of 20 mg of prednisone daily. Patient not taking: Reported on 03/20/2015 02/05/15   Thurnell Lose, MD  sodium chloride (OCEAN) 0.65 % SOLN nasal spray Place 1 spray into both nostrils as needed for congestion. Patient not taking: Reported on 03/20/2015  12/31/14   Silver Huguenin Elgergawy, MD   BP 137/67 mmHg  Pulse 92  Temp(Src) 98.5 F (36.9 C)  Resp 14  Ht 5\' 1"  (1.549 m)  Wt 96 lb (43.545 kg)  BMI 18.15 kg/m2  SpO2 100% Physical Exam  Constitutional: She is oriented to person, place, and time. She appears well-developed.  Frail  HENT:  Head: Normocephalic and atraumatic.  Cardiovascular: Regular rhythm.   Tachycardic with systolic ejection murmur  Pulmonary/Chest: Effort normal. No respiratory distress.  Decreased air movement bilaterally  Abdominal: Soft. There is no tenderness. There is no rebound and no guarding.  Musculoskeletal: She exhibits no edema or tenderness.  Neurological: She is alert and oriented to person, place, and time.  Skin: Skin is warm and dry.  Psychiatric: She has a normal mood and affect. Her behavior is normal.  Nursing note and vitals reviewed.   ED Course  Procedures (including critical care time) Labs Review Labs Reviewed  BASIC METABOLIC PANEL - Abnormal; Notable for the following:    Potassium 3.2 (*)    Chloride 99 (*)    Creatinine, Ser 0.40 (*)    All other components within normal limits  CBC WITH DIFFERENTIAL/PLATELET - Abnormal; Notable for the following:    WBC 12.5 (*)    RBC 3.86 (*)    Hemoglobin 10.5 (*)    HCT 34.5 (*)    Neutro Abs 11.4 (*)    All other components within normal limits  I-STAT VENOUS BLOOD GAS, ED - Abnormal; Notable for the following:    pH, Ven 7.382 (*)    pCO2, Ven 60.4 (*)    pO2, Ven 52.0 (*)    Bicarbonate 35.9 (*)    Acid-Base Excess 9.0 (*)    All other components within normal limits  CBG MONITORING, ED - Abnormal; Notable for the following:    Glucose-Capillary 62 (*)    All other components within normal limits  I-STAT TROPOININ, ED    Imaging Review Dg Chest 2 View  03/20/2015  CLINICAL DATA:  Acute shortness of breath. EXAM: CHEST  2 VIEW COMPARISON:  02/01/2015 and prior exams FINDINGS: Cardiomegaly and COPD/ emphysema again  identified. There is no evidence of focal airspace disease, pulmonary edema, suspicious pulmonary nodule/mass, pleural effusion, or pneumothorax. No acute bony abnormalities are identified. IMPRESSION: No evidence of acute cardiopulmonary disease. Cardiomegaly and COPD/emphysema. Electronically Signed   By: Margarette Canada M.D.   On: 03/20/2015 12:52   I have personally reviewed and evaluated these images and lab results as part of my medical decision-making.   EKG Interpretation   Date/Time:  Sunday March 20 2015 12:36:02 EST Ventricular Rate:  102 PR Interval:  115 QRS Duration: 76 QT Interval:  356 QTC Calculation: 464 R Axis:   69 Text Interpretation:  Sinus or ectopic atrial tachycardia Minimal ST  elevation, inferior leads Confirmed by Hazle Coca [phone]) on 03/20/2015  2:37:51 PM      MDM   Final diagnoses:  None   Patient with history of COPD here with increased dyspnea, tremulousness, anxiety. In terms of her COPD she has no significant change in her symptoms after albuterol treatments, she is currently on her home oxygen settings. There is no evidence of acute infection at this time. She does seem to be very anxious and nervous, provided with Ativan for anxiety. Son is uncomfortable bringing the patient home given her significant issues at night with trembling and jitteriness. Plan to admit for observation for symptom control of her dyspnea and anxiety.     Quintella Reichert, MD 03/20/15 1806

## 2015-03-20 NOTE — ED Notes (Signed)
Attempted report 

## 2015-03-21 DIAGNOSIS — J449 Chronic obstructive pulmonary disease, unspecified: Secondary | ICD-10-CM

## 2015-03-21 DIAGNOSIS — J441 Chronic obstructive pulmonary disease with (acute) exacerbation: Secondary | ICD-10-CM

## 2015-03-21 DIAGNOSIS — E118 Type 2 diabetes mellitus with unspecified complications: Secondary | ICD-10-CM | POA: Diagnosis not present

## 2015-03-21 DIAGNOSIS — Z515 Encounter for palliative care: Secondary | ICD-10-CM | POA: Insufficient documentation

## 2015-03-21 DIAGNOSIS — Z794 Long term (current) use of insulin: Secondary | ICD-10-CM

## 2015-03-21 LAB — BASIC METABOLIC PANEL
Anion gap: 10 (ref 5–15)
BUN: 11 mg/dL (ref 6–20)
CALCIUM: 9.4 mg/dL (ref 8.9–10.3)
CO2: 28 mmol/L (ref 22–32)
CREATININE: 0.71 mg/dL (ref 0.44–1.00)
Chloride: 100 mmol/L — ABNORMAL LOW (ref 101–111)
Glucose, Bld: 398 mg/dL — ABNORMAL HIGH (ref 65–99)
Potassium: 4.2 mmol/L (ref 3.5–5.1)
SODIUM: 138 mmol/L (ref 135–145)

## 2015-03-21 LAB — GLUCOSE, CAPILLARY
GLUCOSE-CAPILLARY: 299 mg/dL — AB (ref 65–99)
Glucose-Capillary: 300 mg/dL — ABNORMAL HIGH (ref 65–99)
Glucose-Capillary: 306 mg/dL — ABNORMAL HIGH (ref 65–99)

## 2015-03-21 MED ORDER — POLYETHYLENE GLYCOL 3350 17 G PO PACK
17.0000 g | PACK | Freq: Every day | ORAL | Status: DC
  Filled 2015-03-21: qty 1

## 2015-03-21 MED ORDER — AZITHROMYCIN 250 MG PO TABS: 250.0000 mg | ORAL_TABLET | Freq: Every day | ORAL | Status: AC

## 2015-03-21 MED ORDER — INSULIN ASPART 100 UNIT/ML ~~LOC~~ SOLN
4.0000 [IU] | Freq: Once | SUBCUTANEOUS | Status: AC
  Administered 2015-03-21: 4 [IU] via SUBCUTANEOUS

## 2015-03-21 MED ORDER — INSULIN GLARGINE 100 UNIT/ML ~~LOC~~ SOLN: 15.0000 [IU] | Freq: Every day | SUBCUTANEOUS | Status: DC

## 2015-03-21 MED ORDER — PREDNISONE 10 MG PO TABS: 20.0000 mg | ORAL_TABLET | ORAL | Status: DC

## 2015-03-21 NOTE — Progress Notes (Signed)
Inpatient Diabetes Program Recommendations  AACE/ADA: New Consensus Statement on Inpatient Glycemic Control (2015)  Target Ranges:  Prepandial:   less than 140 mg/dL      Peak postprandial:   less than 180 mg/dL (1-2 hours)      Critically ill patients:  140 - 180 mg/dL   Results for Judith Boyer, Judith Boyer (MRN [id number]) as of 03/21/2015 08:50  Ref. Range 03/20/2015 13:42 03/20/2015 19:02 03/20/2015 20:07 03/21/2015 01:51 03/21/2015 07:33  Glucose-Capillary Latest Ref Range: 65-99 mg/dL 62 (L) 248 (H) 241 (H) 300 (H) 306 (H)   Review of Glycemic Control  Diabetes history: DM 2 Outpatient Diabetes medications: Lantus 25 units, Novolog 2-10 units TID Current orders for Inpatient glycemic control: Lantus 10, Novolog Sensitive   Inpatient Diabetes Program Recommendations: Insulin - Basal: Patient takes Lantus 25 units at home. Patient only received 10 units here and is 300's this am. Please consider increasing basal insulin to Lantus 16-18 units Q 24hrs.  Note patient hypoglycemia on admission in the 60's. 125 mg IV solumedrol given at that time.  Thanks,  Tama Headings RN, MSN, [facility] Inpatient Diabetes Coordinator Team Pager [phone] (8a-5p)

## 2015-03-21 NOTE — Discharge Summary (Signed)
[facility] Discharge Summary  Patient name: Judith Boyer Medical record number: [id number] Date of birth: 07-29-1945 Age: 70 y.o. Gender: female Date of Admission: 03/20/2015  Date of Discharge: 03/21/15 Admitting Physician: Kinnie Feil, MD  Primary Care Provider: Jenny Reichmann, MD Consultants: none  Indication for Hospitalization: dyspnea  Discharge Diagnoses/Problem List:  Patient Active Problem List   Diagnosis Date Noted  . 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 Hospice care patient   . Type 2 diabetes mellitus with complication, with long-term current use of insulin ([place])   . Hypoglycemia   . Hypokalemia   . SOB (shortness of breath)   . Acute respiratory failure ([place]) 01/30/2015  . HCAP (healthcare-associated pneumonia) 01/30/2015  . DNR (do not resuscitate) discussion 12/29/2014  . Acute on chronic respiratory failure with hypoxia and hypercapnia (HCC)   . Hyperkalemia 12/25/2014  . Tobacco abuse   . Noncompliance   . Acute on chronic respiratory failure with hypoxia ([place])   . Acute exacerbation of chronic obstructive pulmonary disease (COPD) ([place])   . Diabetes mellitus with complication ([place])   . Chronic respiratory failure with hypercapnia ([place]) 09/24/2014  . Palliative care by specialist   . Dysphagia   . Dyspnea 08/13/2014  . Tachycardia   . Respiratory acidosis   . Coronary artery disease involving native coronary artery of native heart without angina pectoris   . Anxiety   . B12 deficiency 02/10/2014  . Protein-calorie malnutrition, severe ([place]) 02/09/2014  . Acute respiratory distress (HCC) 02/08/2014  . Vitamin B 12 deficiency 06/28/2013  . Palliative care encounter 02/25/2013  . Weakness generalized 02/25/2013  . COPD exacerbation ([place]) 02/20/2013  . Anemia 02/20/2013  . Nocturnal hypoxemia 12/30/2012  . COPD GOLD III 02/06/2011  . Smoker 02/06/2011  . Hypertension 02/06/2011  . Diabetes mellitus type 2, controlled, without  complications ([place]) [id number]    Disposition: Home  Discharge Condition: stable  Discharge Exam: see previous progress note  Brief Hospital Course:  Judith Boyer is a 70 y.o. female who presented with dyspnea . PMH is significant for COPD, Cath s/p stent placement, DM2  Mild COPD exacerbation:  CXR unremarkable. [place] continued. Continued on home 3 L O2 [place]. Home prednisone was stopped and patient given Prednisone 40 mg for 5 day course and Azithromycin was given for 5 day course. Patient given duoneb q6hrs and albuterol neb q4hrs prn. Respiratory status improved to baseline upon discharge.   Episodes of shaking:  Unlikely seizure since patient is alert during episodes. Possible related to hypoglycemia as glucose did go as low as 62 on admission. Lantus was decreased to 10 units.   Diabetes mellitus:  Blood sugar of 62 initially, home Lantus was decreased to 10 units and glucose stablized.  Hypokalemia:  K of 3.2 on admission. Resolved with [facility].   Palpitations:  Home dose of Diltiazem was switched to diltiazem 60mg  TID during hospitalization due to continued palpitations.  All other chronic medical conditions stable throughout admission and managed with home regimens.  Issues for Follow Up:  1. Diabetes: Lantus was decreased to 15 units daily on discharge for previous hypoglycemia. Would likely benefit more from an oral agent 2. Palpitations: May need cardiology follow up. Home Diltiazem regimen was resumed on discharge 3. COPD: ensure patient completed 5 day course of 40 mg Prednisone and Azithromycin. Will need to resume home dose of 20 mg Prednisone after 5 days of 40 mg Prednisone is complete.  Significant Procedures: none  Significant Labs and Imaging:   Recent Labs Lab 03/20/15 1353  WBC 12.5*  HGB 10.5*  HCT 34.5*  PLT 312    Recent Labs Lab 03/20/15 1353 03/21/15 0540  NA 142 138  K 3.2* 4.2  CL 99* 100*  CO2 31 28  GLUCOSE 82 398*  BUN 7  11  CREATININE 0.40* 0.71  CALCIUM 8.9 9.4    No results found.   Results/Tests Pending at Time of Discharge: 03/21/15  Discharge Medications:    Medication List    STOP taking these medications        morphine CONCENTRATE 10 MG/0.5ML Soln concentrated solution      TAKE these medications        acetaminophen 325 MG tablet  Commonly known as:  TYLENOL  Take 2 tablets (650 mg total) by mouth every 6 (six) hours as needed for mild pain (or Fever >/= 101).     arformoterol 15 MCG/2ML Nebu  Commonly known as:  BROVANA  Take 2 mLs (15 mcg total) by nebulization 2 (two) times daily.     atorvastatin 10 MG tablet  Commonly known as:  LIPITOR  Take 10 mg by mouth daily.     bisacodyl 10 MG suppository  Commonly known as:  DULCOLAX  Place 1 suppository (10 mg total) rectally daily as needed for moderate constipation.     diltiazem 180 MG 24 hr capsule  Commonly known as:  CARDIZEM CD  Take 1 capsule (180 mg total) by mouth daily.     feeding supplement (GLUCERNA SHAKE) Liqd  Take 237 mLs by mouth 3 (three) times daily between meals.     haloperidol 5 MG tablet  Commonly known as:  HALDOL  Take 5 mg by mouth every 4 (four) hours as needed for agitation (for anxiety or nausea).     insulin aspart 100 UNIT/ML injection  Commonly known as:  NOVOLOG  Before each meal 3 times a day, 140-199 - 2 units, 200-250 - 4 units, 251-299 - 6 units,  300-349 - 8 units,  350 or above 10 units. Dispense syringes and needles as needed, Ok to switch to PEN if approved. Substitute to any brand approved. DX DM2, Code E11.65     insulin glargine 100 UNIT/ML injection  Commonly known as:  LANTUS  Inject 0.15 mLs (15 Units total) into the skin daily. Dispense insulin pen if approved, if not dispense as needed syringes and needles for 1 month supply. Can switch to Levemir. Diagnosis E 11.65.     ipratropium-albuterol 0.5-2.5 (3) MG/3ML Soln  Commonly known as:  DUONEB  Take 3 mLs by nebulization  4 (four) times daily - after meals and at bedtime.     LORazepam 0.5 MG tablet  Commonly known as:  ATIVAN  Take 0.5-1 mg by mouth every 8 (eight) hours as needed for anxiety.     Melatonin 3 MG Tabs  Take 3 mg by mouth at bedtime.     montelukast 10 MG tablet  Commonly known as:  SINGULAIR  take 1 tablet by mouth once daily     MUCINEX MAXIMUM STRENGTH 1200 MG Tb12  Generic drug:  Guaifenesin  Take 1,200 mg by mouth 2 (two) times daily.     nitroGLYCERIN 0.4 MG SL tablet  Commonly known as:  NITROSTAT  Place 1 tablet (0.4 mg total) under the tongue every 5 (five) minutes x 3 doses as needed for chest pain.     Oxycodone HCl 10 MG Tabs  Take 10 mg by mouth every 4 (four) hours as needed (for pain or shortness of breath).     OXYGEN  Inhale 3 L into the lungs continuous.     pantoprazole 40 MG tablet  Commonly known as:  PROTONIX  Take 40 mg by mouth daily.     polyethylene glycol packet  Commonly known as:  MIRALAX / GLYCOLAX  Take 17 g by mouth daily as needed (constipation).     predniSONE 10 MG tablet  Commonly known as:  DELTASONE  Take 2 tablets (20 mg total) by mouth every morning.     PROAIR HFA 108 (90 Base) MCG/ACT inhaler  Generic drug:  albuterol  Inhale 2 puffs into the lungs every 4 (four) hours as needed for wheezing or shortness of breath.     QUEtiapine 25 MG tablet  Commonly known as:  SEROQUEL  Take 25 mg by mouth at bedtime.     senna-docusate 8.6-50 MG tablet  Commonly known as:  Senokot-S  Take 2 tablets by mouth at bedtime.     sodium chloride 0.65 % Soln nasal spray  Commonly known as:  OCEAN  Place 1 spray into both nostrils as needed for congestion.        Discharge Instructions: Please refer to Patient Instructions section of EMR for full details.  Patient was counseled important signs and symptoms that should prompt return to medical care, changes in medications, dietary instructions, activity restrictions, and follow up  appointments.   Follow-Up Appointments:     Follow-up Information    Schedule an appointment as soon as possible for a visit with DAUB, [name], MD.   Specialty:  Family Medicine   Why:  hospital follow up   Contact information:   [place] [id number]       Carlyle Dolly, MD 03/21/2015, 3:40 PM PGY-1, [place]

## 2015-03-21 NOTE — Care Management Obs Status (Signed)
[place] NOTIFICATION   Patient Details  Name: Judith Boyer MRN: [id number] Date of Birth: [birth year]   Medicare Observation Status Notification Given:  Yes    Maria Coin, Rory Percy, RN 03/21/2015, 1:53 PM

## 2015-03-21 NOTE — Discharge Instructions (Signed)
You were admitted to the hospital for COPD exacerbation.  Please continue taking Azithromycin for 3 more days and Prednisone 40 mg for 3 more days. After that, you can resume home dose of Prednisone of 20 mg daily. Additionally, you should only take 15 units of Lantus daily so your sugars don't drop too low.  If you develop worsening shortness of breath or trouble breathing, please seek medical attention.  It was a pleasure caring for you, take care!   Chronic Obstructive Pulmonary Disease Chronic obstructive pulmonary disease (COPD) is a common lung problem. In COPD, the flow of air from the lungs is limited. The way your lungs work will probably never return to normal, but there are things you can do to improve your lungs and make yourself feel better. Your doctor may treat your condition with:  Medicines.  Oxygen.  Lung surgery.  Changes to your diet.  Rehabilitation. This may involve a team of specialists. HOME CARE  Take all medicines as told by your doctor.  Avoid medicines or cough syrups that dry up your airway (such as antihistamines) and do not allow you to get rid of thick spit. You do not need to avoid them if told differently by your doctor.  If you smoke, stop. Smoking makes the problem worse.  Avoid being around things that make your breathing worse (like smoke, chemicals, and fumes).  Use oxygen therapy and therapy to help improve your lungs (pulmonary rehabilitation) if told by your doctor. If you need home oxygen therapy, ask your doctor if you should buy a tool to measure your oxygen level (oximeter).  Avoid people who have a sickness you can catch (contagious).  Avoid going outside when it is very hot, cold, or humid.  Eat healthy foods. Eat smaller meals more often. Rest before meals.  Stay active, but remember to also rest.  Make sure to get all the shots (vaccines) your doctor recommends. Ask your doctor if you need a pneumonia shot.  Learn and use tips  on how to relax.  Learn and use tips on how to control your breathing as told by your doctor. Try:  Breathing in (inhaling) through your nose for 1 second. Then, pucker your lips and breath out (exhale) through your lips for 2 seconds.  Putting one hand on your belly (abdomen). Breathe in slowly through your nose for 1 second. Your hand on your belly should move out. Pucker your lips and breathe out slowly through your lips. Your hand on your belly should move in as you breathe out.  Learn and use controlled coughing to clear thick spit from your lungs. The steps are: 1. Lean your head a little forward. 2. Breathe in deeply. 3. Try to hold your breath for 3 seconds. 4. Keep your mouth slightly open while coughing 2 times. 5. Spit any thick spit out into a tissue. 6. Rest and do the steps again 1 or 2 times as needed. GET HELP IF:  You cough up more thick spit than usual.  There is a change in the color or thickness of the spit.  It is harder to breathe than usual.  Your breathing is faster than usual. GET HELP RIGHT AWAY IF:  You have shortness of breath while resting.  You have shortness of breath that stops you from:  Being able to talk.  Doing normal activities.  You chest hurts for longer than 5 minutes.  Your skin color is more blue than usual.  Your pulse oximeter  shows that you have low oxygen for longer than 5 minutes. MAKE SURE YOU:  Understand these instructions.  Will watch your condition.  Will get help right away if you are not doing well or get worse.   This information is not intended to replace advice given to you by your health care provider. Make sure you discuss any questions you have with your health care provider.   Document Released: 07/11/2007 Document Revised: 02/12/2014 Document Reviewed: 09/18/2012 Elsevier Interactive Patient Education [employer].

## 2015-03-21 NOTE — Progress Notes (Signed)
Discharge instructions given. Pt verbalized understanding and all questions were answered.  

## 2015-03-21 NOTE — Progress Notes (Signed)
[facility]E-09-[facility] RN Visit  This is a related admission to [facility] diagnosis of COPD. [facility] was not notified of transfer to the ED.  Patient seen in room, sitting up on side of bed. She stated she will possibly be discharging back home with hospice today.  She denied any additonal DME needs. Patient currently on 3L O2 with O2 sat and 97% and respirations at 21. She is receiving nebulizer treatments BID.  She received 2 doses of Ativan 0.5 and 1mg  overnight. She denies any pain.  She ate 100% of her breakfast tray.  [facility] will continue to follow and anticipate any discharge needs.  Updated medication list and transfer summary placed on chart.  Please call with any questions.  Thank you, Freddi Starr RN, [facility] Liaison [phone]

## 2015-03-21 NOTE — Care Management Note (Signed)
Case Management Note  Patient Details  Name: FELICITAS SINE MRN: [id number] Date of Birth: [birth year]  Subjective/Objective:        CM following for progression and d/c planning.            Action/Plan: 03/21/2015 Met with pt who is active with [facility]), services will be continued, hospice rep, Olivia Mackie met with pt and son. No DME needs.   Expected Discharge Date:    03/21/15              Expected Discharge Plan:  Home w Hospice Care  In-House Referral:  Clinical Social Work  Discharge planning Services  CM Consult  Post Acute Care Choice:    Choice offered to:  Adult Children  DME Arranged:    DME Agency:     HH Arranged:  RN, Nurse's Aide, Social Work [employer] Agency:  [facility] [place] [facility] [employer]  Status of Service:  Completed, signed off  Medicare Important Message Given:    Date Medicare IM Given:    Medicare IM give by:    Date Additional Medicare IM Given:    Additional Medicare Important Message give by:     If discussed at [place] of Stay Meetings, dates discussed:    Additional Comments:  Adron Bene, RN 03/21/2015, 4:19 PM

## 2015-03-23 ENCOUNTER — Other Ambulatory Visit: Payer: Self-pay | Admitting: Emergency Medicine

## 2015-03-25 NOTE — Telephone Encounter (Signed)
Dr Everlene Farrier, I don't see that we have Rxd the anti-nausea med for pt before. It appears she was given it in hosp. Is this something that hospice would get for her for Sx management?

## 2015-03-27 NOTE — Telephone Encounter (Signed)
Dr. Everlene Farrier does not prescribe lorazepam or phenergan for patient. Please have patient come in for hospital follow up and medication review with Dr. Everlene Farrier as soon as possible.

## 2015-03-31 ENCOUNTER — Telehealth: Payer: Self-pay | Admitting: *Deleted

## 2015-03-31 ENCOUNTER — Inpatient Hospital Stay: Payer: Medicare Other | Admitting: Emergency Medicine

## 2015-03-31 NOTE — Telephone Encounter (Signed)
Faxed signed orders to [facility], per Dr Everlene Farrier. Confirmation page received at 10:57 am.

## 2015-04-01 ENCOUNTER — Other Ambulatory Visit: Payer: Self-pay | Admitting: Emergency Medicine

## 2015-04-01 ENCOUNTER — Telehealth: Payer: Self-pay

## 2015-04-01 MED ORDER — MORPHINE SULFATE 20 MG/5ML PO SOLN: ORAL | Status: DC

## 2015-04-01 NOTE — Telephone Encounter (Signed)
Attention Dr. Everlene FarrierBurnis Boyer  would like you to know pt is having difficulty breathing. She currently takes Oxycodone HCl 10 MG TABS [id number] every 4 hours to help with her breathing. Judith Boyer thinks she should be put on Morphine? Please advise at [phone]

## 2015-04-01 NOTE — Telephone Encounter (Signed)
Spoke with son and hospice nurse.  Will change to morphine solution 20mg /5cc to take 0.25cc to 0.5cc every 4-6 hours.  Spoke with son and he will pickup rx.

## 2015-04-03 ENCOUNTER — Ambulatory Visit (INDEPENDENT_AMBULATORY_CARE_PROVIDER_SITE_OTHER): Payer: Medicare Other | Admitting: Emergency Medicine

## 2015-04-03 ENCOUNTER — Encounter: Payer: Self-pay | Admitting: Emergency Medicine

## 2015-04-03 VITALS — BP 188/80 | HR 110 | Temp 98.0°F | Resp 24 | Ht 59.5 in | Wt 99.4 lb

## 2015-04-03 DIAGNOSIS — J441 Chronic obstructive pulmonary disease with (acute) exacerbation: Secondary | ICD-10-CM | POA: Diagnosis not present

## 2015-04-03 DIAGNOSIS — E139 Other specified diabetes mellitus without complications: Secondary | ICD-10-CM | POA: Diagnosis not present

## 2015-04-03 LAB — GLUCOSE, POCT (MANUAL RESULT ENTRY): POC Glucose: 143 mg/dl — AB (ref 70–99)

## 2015-04-03 MED ORDER — OXYCODONE HCL 5 MG PO TABS: ORAL_TABLET | ORAL | Status: DC

## 2015-04-03 NOTE — Progress Notes (Signed)
Patient ID: Judith Boyer, female   DOB: [birth year], 70 y.o.   MRN: [id number]    This chart was scribed for Nena Jordan, MD by [facility], medical scribe at [facility].The patient was seen in exam room 09 and the patient's care was started at 8:28 AM.  Chief Complaint:  Chief Complaint  Patient presents with  . Shortness of Breath   HPI: Judith Boyer is a 70 y.o. female who [facility] to [facility] today complaining of SOB. Recently changed to morphine at hospice care, and this scares her because it makes her unbalanced. She would like her to switch back to oxycodone. Able to eat, blood sugar is running in the 200's. Home care nurse come twice a week. At home alone throughout the day, she rests most of the time with occasional visits from family members. She does not use her walker.  Past Medical History  Diagnosis Date  . COPD (chronic obstructive pulmonary disease) ([place])   . Hypertension   . Coronary artery disease   . Shortness of breath   . High cholesterol   . Myocardial infarction ([place]) 02/2011  . On home oxygen therapy     "2L; 24h/day" (12/20/2014)  . Pneumonia     "couple times" (02/08/2014)  . Type II diabetes mellitus ([place])     type 2  . Anemia   . History of blood transfusion 2014    "extremely anemic"   Past Surgical History  Procedure Laterality Date  . Shoulder arthroscopy w/ rotator cuff repair Left   . Left heart catheterization with coronary angiogram N/A 02/09/2011    Procedure: LEFT HEART CATHETERIZATION WITH CORONARY ANGIOGRAM;  Surgeon: Birdie Riddle, MD;  Location: [facility];  Service: Cardiovascular;  Laterality: N/A;  . Tonsillectomy    . Abdominal hysterectomy  1970's  . Coronary angioplasty with stent placement  12/2007    "2"  . Cardiac catheterization  02/2011   Social History   Social History  . Marital Status: Widowed    Spouse Name: N/A  . Number of Children: N/A  . Years of Education: N/A   Social History Main  Topics  . Smoking status: Former Smoker -- 1.00 packs/day for 50 years    Types: Cigarettes    Quit date: 01/10/2013  . Smokeless tobacco: Never Used  . Alcohol Use: No  . Drug Use: No  . Sexual Activity: No   Other Topics Concern  . None   Social History Narrative   Family History  Problem Relation Age of Onset  . Heart disease Maternal Grandmother   . Brain cancer Brother   . Lung cancer Mother     smoked  . Diabetes Mother   . Diabetes Brother    Allergies  Allergen Reactions  . Doxycycline Anaphylaxis  . Codeine Other (See Comments)    hyperactivity  . Dulera [Mometasone Furo-Formoterol Fum] Hives and Rash  . Oxycodone Other (See Comments)    Zonked out on 10 mg,  Pt can tolerate 5 mg  . Adhesive [Tape] Rash  . Latex Rash  . Lisinopril Cough   Prior to Admission medications   Medication Sig Start Date End Date Taking? Authorizing Provider  acetaminophen (TYLENOL) 325 MG tablet Take 2 tablets (650 mg total) by mouth every 6 (six) hours as needed for mild pain (or Fever >/= 101). 12/31/14   Silver Huguenin Elgergawy, MD  albuterol (PROAIR HFA) 108 (90 BASE) MCG/ACT inhaler Inhale 2 puffs into  the lungs every 4 (four) hours as needed for wheezing or shortness of breath.    Historical Provider, MD  arformoterol (BROVANA) 15 MCG/2ML NEBU Take 2 mLs (15 mcg total) by nebulization 2 (two) times daily. 12/31/14   Silver Huguenin Elgergawy, MD  atorvastatin (LIPITOR) 10 MG tablet Take 10 mg by mouth daily. 01/26/15   Historical Provider, MD  bisacodyl (DULCOLAX) 10 MG suppository Place 1 suppository (10 mg total) rectally daily as needed for moderate constipation. Patient not taking: Reported on 01/31/2015 12/31/14   Silver Huguenin Elgergawy, MD  diltiazem (CARDIZEM CD) 180 MG 24 hr capsule Take 1 capsule (180 mg total) by mouth daily. 03/16/15   [name], MD  feeding supplement, GLUCERNA SHAKE, (GLUCERNA SHAKE) LIQD Take 237 mLs by mouth 3 (three) times daily between meals. Patient taking  differently: Take 237 mLs by mouth daily.  12/31/14   Silver Huguenin Elgergawy, MD  Guaifenesin (MUCINEX MAXIMUM STRENGTH) 1200 MG TB12 Take 1,200 mg by mouth 2 (two) times daily.    Historical Provider, MD  haloperidol (HALDOL) 5 MG tablet Take 5 mg by mouth every 4 (four) hours as needed for agitation (for anxiety or nausea).  03/07/15   Historical Provider, MD  insulin aspart (NOVOLOG) 100 UNIT/ML injection Before each meal 3 times a day, 140-199 - 2 units, 200-250 - 4 units, 251-299 - 6 units,  300-349 - 8 units,  350 or above 10 units. Dispense syringes and needles as needed, Ok to switch to PEN if approved. Substitute to any brand approved. DX DM2, Code E11.65 Patient taking differently: Inject into the skin 3 (three) times daily with meals. Before each meal 3 times a day, 140-199 - 2 units, 200-250 - 4 units, 251-299 - 6 units,  300-349 - 8 units,  350 or above 10 units. Dispense syringes and needles as needed, Ok to switch to PEN if approved. Substitute to any brand approved. DX DM2, Code E11.65 02/05/15   Thurnell Lose, MD  insulin glargine (LANTUS) 100 UNIT/ML injection Inject 0.15 mLs (15 Units total) into the skin daily. Dispense insulin pen if approved, if not dispense as needed syringes and needles for 1 month supply. Can switch to Levemir. Diagnosis E 11.65. 03/21/15   Carlyle Dolly, MD  ipratropium-albuterol (DUONEB) 0.5-2.5 (3) MG/3ML SOLN Take 3 mLs by nebulization 4 (four) times daily - after meals and at bedtime. 12/31/14   Silver Huguenin Elgergawy, MD  LORazepam (ATIVAN) 0.5 MG tablet Take 0.5-1 mg by mouth every 8 (eight) hours as needed for anxiety.  03/07/15   Historical Provider, MD  Melatonin 3 MG TABS Take 3 mg by mouth at bedtime.    Historical Provider, MD  montelukast (SINGULAIR) 10 MG tablet take 1 tablet by mouth once daily Patient taking differently: take 10 mg by mouth daily with supper 07/09/14   [name], MD  morphine 20 MG/5ML solution Patient can have 0.25  To .5 mL  every 4-6 hours as needed 04/01/15   [name], MD  nitroGLYCERIN (NITROSTAT) 0.4 MG SL tablet Place 1 tablet (0.4 mg total) under the tongue every 5 (five) minutes x 3 doses as needed for chest pain. 02/07/12   Dixie Dials, MD  Oxycodone HCl 10 MG TABS Take 10 mg by mouth every 4 (four) hours as needed (for pain or shortness of breath).  03/07/15   Historical Provider, MD  OXYGEN Inhale 3 L into the lungs continuous.     Historical Provider, MD  pantoprazole (PROTONIX) 40 MG tablet Take 40 mg by mouth daily.     Historical Provider, MD  polyethylene glycol (MIRALAX / GLYCOLAX) packet Take 17 g by mouth daily as needed (constipation).    Historical Provider, MD  predniSONE (DELTASONE) 10 MG tablet Take 2 tablets (20 mg total) by mouth every morning. 03/21/15   Carlyle Dolly, MD  QUEtiapine (SEROQUEL) 25 MG tablet Take 25 mg by mouth at bedtime.  03/07/15   Historical Provider, MD  senna-docusate (SENOKOT-S) 8.6-50 MG tablet Take 2 tablets by mouth at bedtime. Patient taking differently: Take 1 tablet by mouth 2 (two) times daily.  12/31/14   Silver Huguenin Elgergawy, MD  sodium chloride (OCEAN) 0.65 % SOLN nasal spray Place 1 spray into both nostrils as needed for congestion. Patient not taking: Reported on 03/20/2015 12/31/14   Silver Huguenin Elgergawy, MD   ROS: The patient denies fevers, chills, night sweats, unintentional weight loss, chest pain, palpitations, wheezing, dyspnea on exertion, nausea, vomiting, abdominal pain, dysuria, hematuria, melena, numbness, weakness, or tingling.  All other systems have been reviewed and were otherwise negative with the exception of those mentioned in the HPI and as above.    PHYSICAL EXAM: Filed Vitals:   04/03/15 0822  BP: 188/80  Pulse: 110  Temp: 98 F (36.7 C)  Resp: 24   Body mass index is 19.75 kg/(m^2). Results for orders placed or performed in visit on 04/03/15  POCT glucose (manual entry)  Result Value Ref Range   POC Glucose 143 (A) 70 - 99  mg/dl   General: Alert, no acute distress. Some what somnolent  but answers questions approprietly.  HEENT:  Normocephalic, atraumatic, oropharynx patent. Eye: [name] [facility] Cardiovascular:  Regular rate and rhythm, no rubs murmurs or gallops.  No Carotid bruits, radial pulse intact. No pedal edema.  Respiratory: Clear to auscultation bilaterally.  No wheezes, rales, or rhonchi.  No cyanosis, no use of accessory. Minimal air exchange no wheezes. musculature. Abdominal: No organomegaly, abdomen is soft and non-tender, positive bowel sounds.  No masses. Musculoskeletal: Gait intact. No edema, tenderness Skin: No rashes. Neurologic: Facial musculature symmetric. Psychiatric: Patient acts appropriately throughout our interaction. Lymphatic: No cervical or submandibular lymphadenopathy Genitourinary/Anorectal: No acute findings  LABS:  ASSESSMENT/PLAN: We discussed end-of-life issues. I changed her to oxycodone 5 mg to take 1 every 2-4 hours as needed. She was uncomfortable using nitroglycerin. She will continue on sliding scale insulin. We will continue to work with the hospice nurse regarding her care. Gross sideeffects, risk and benefits, and alternatives of medications d/w patient. Patient is aware that all medications have potential sideeffects and we are unable to predict every sideeffect or drug-drug interaction that may occur.  By signing my name below, I, Nadim Abuhashem, attest that this documentation has been prepared under the direction and in the presence of Nena Jordan, MD.  Electronically Signed: Lora Havens, medical scribe. 04/03/2015, 8:38 AM.  [name] MD 04/03/2015 8:28 AM

## 2015-04-03 NOTE — Patient Instructions (Signed)
Please try to oxycodone instead of morphine for your respiratory problems. No change in your breathing medications. Continue your sliding scale insulin.

## 2015-04-04 ENCOUNTER — Telehealth: Payer: Self-pay | Admitting: Family Medicine

## 2015-04-04 NOTE — Telephone Encounter (Signed)
Windy Canny, hospice nurse, called to let Dr. Everlene Farrier know BP has still been running high. Today BP 166/80, she is asymptomatic. She was recently started on Cardizem 180mg  24 hr capsule 1 poqd. She wanted to know if you wanted to change dose, add medication, etc. Please advise. Windy Canny can be reached at [phone]

## 2015-04-05 MED ORDER — DILTIAZEM HCL ER 240 MG PO CP24: 240.0000 mg | ORAL_CAPSULE | Freq: Every day | ORAL | Status: DC

## 2015-04-05 NOTE — Telephone Encounter (Signed)
Please increase her Cardizem to the 240 mg dose. She can have #30 with refills for one year. Please advise her hospice nurse  of the change. Be sure she does not take both meds.

## 2015-04-05 NOTE — Telephone Encounter (Signed)
Livina advised. Rx sent to pharmacy.

## 2015-04-10 ENCOUNTER — Other Ambulatory Visit: Payer: Self-pay | Admitting: Family Medicine

## 2015-04-10 DIAGNOSIS — J441 Chronic obstructive pulmonary disease with (acute) exacerbation: Secondary | ICD-10-CM

## 2015-04-10 MED ORDER — OXYCODONE HCL 5 MG PO TABS: ORAL_TABLET | ORAL | Status: DC

## 2015-04-10 NOTE — Telephone Encounter (Signed)
Sonia Baller, Hospice nurse, called to request refill on Oxycodone. She has been taking 1 tab q2 hrs. She has 2 tabs left. Since Dr. Everlene Farrier will not be back in office until Tuesday could another provider refill? Patients son can p/u today. I spoke with Dr. Tamala Julian and she will write rx so he can p/u today. Sonia Baller notified and voiced understanding.

## 2015-04-10 NOTE — Telephone Encounter (Signed)
Discussed with Dr. Tamala Julian. Refilled today for her son to pick up.

## 2015-04-13 DIAGNOSIS — J449 Chronic obstructive pulmonary disease, unspecified: Secondary | ICD-10-CM | POA: Diagnosis not present

## 2015-04-14 ENCOUNTER — Other Ambulatory Visit: Payer: Self-pay | Admitting: Emergency Medicine

## 2015-04-16 DIAGNOSIS — J449 Chronic obstructive pulmonary disease, unspecified: Secondary | ICD-10-CM | POA: Diagnosis not present

## 2015-04-18 ENCOUNTER — Other Ambulatory Visit: Payer: Self-pay

## 2015-04-18 DIAGNOSIS — J441 Chronic obstructive pulmonary disease with (acute) exacerbation: Secondary | ICD-10-CM

## 2015-04-18 NOTE — Telephone Encounter (Signed)
kisha from hospice is calling stating that patient is needing a refill on oxycodone and lorazepam   Please call [phone] to let her know to pick it up  [facility] [phone]

## 2015-04-19 MED ORDER — LORAZEPAM 0.5 MG PO TABS: 0.5000 mg | ORAL_TABLET | Freq: Three times a day (TID) | ORAL | Status: DC | PRN

## 2015-04-19 MED ORDER — OXYCODONE HCL 5 MG PO TABS: ORAL_TABLET | ORAL | Status: DC

## 2015-04-19 NOTE — Telephone Encounter (Signed)
Dr Daub? 

## 2015-04-20 NOTE — Telephone Encounter (Signed)
Son advised ready to pick up.

## 2015-05-03 ENCOUNTER — Telehealth: Payer: Self-pay

## 2015-05-03 ENCOUNTER — Other Ambulatory Visit: Payer: Self-pay | Admitting: Emergency Medicine

## 2015-05-03 DIAGNOSIS — J441 Chronic obstructive pulmonary disease with (acute) exacerbation: Secondary | ICD-10-CM

## 2015-05-03 MED ORDER — OXYCODONE HCL 5 MG PO TABS: ORAL_TABLET | ORAL | Status: DC

## 2015-05-03 NOTE — Telephone Encounter (Signed)
Hospice called stating that patient needs her oxycodone called into [facility]

## 2015-05-04 NOTE — Telephone Encounter (Signed)
LM at [facility] # that Rx is ready for p/up.

## 2015-05-13 ENCOUNTER — Other Ambulatory Visit: Payer: Self-pay | Admitting: Emergency Medicine

## 2015-05-14 DIAGNOSIS — J449 Chronic obstructive pulmonary disease, unspecified: Secondary | ICD-10-CM | POA: Diagnosis not present

## 2015-05-17 ENCOUNTER — Telehealth: Payer: Self-pay

## 2015-05-17 ENCOUNTER — Other Ambulatory Visit: Payer: Self-pay | Admitting: Emergency Medicine

## 2015-05-17 DIAGNOSIS — J449 Chronic obstructive pulmonary disease, unspecified: Secondary | ICD-10-CM | POA: Diagnosis not present

## 2015-05-17 DIAGNOSIS — J441 Chronic obstructive pulmonary disease with (acute) exacerbation: Secondary | ICD-10-CM

## 2015-05-17 MED ORDER — OXYCODONE HCL 5 MG PO TABS: ORAL_TABLET | ORAL | Status: DC

## 2015-05-17 NOTE — Telephone Encounter (Signed)
Pt in need of her OXYCODONE 5 MG. Please call [phone]

## 2015-05-18 NOTE — Telephone Encounter (Signed)
Dr Everlene Farrier, you haven't Rxd this for pt before, looks like started in hosp. OK to give RF?

## 2015-05-31 ENCOUNTER — Other Ambulatory Visit: Payer: Self-pay

## 2015-05-31 DIAGNOSIS — J441 Chronic obstructive pulmonary disease with (acute) exacerbation: Secondary | ICD-10-CM

## 2015-05-31 NOTE — Telephone Encounter (Addendum)
Pt is needing a refill on oxycodone   Hospice nurse [phone]  If any questions   Please call [phone] when ready also hospice nurse wants to know if possible to fax to pharmacy

## 2015-06-01 ENCOUNTER — Other Ambulatory Visit: Payer: Self-pay | Admitting: Emergency Medicine

## 2015-06-01 DIAGNOSIS — J441 Chronic obstructive pulmonary disease with (acute) exacerbation: Secondary | ICD-10-CM

## 2015-06-01 MED ORDER — OXYCODONE HCL 5 MG PO TABS: ORAL_TABLET | ORAL | Status: DC

## 2015-06-01 NOTE — Telephone Encounter (Signed)
Dr Everlene Farrier, please review. Pended.

## 2015-06-01 NOTE — Telephone Encounter (Signed)
Dr Everlene Farrier wrote Rx and I spoke to [facility] nurse who stated that pharm will accept narcotic meds for Hospice pts by fax. Faxed Rx and called pt to let her know this was done. Also asked Hospice nurse if she can have Hospice MDs handle Sx management, and Dr Everlene Farrier will be back up since he is not always in the office. She agreed to check on this and bring it up at meeting. I checked w/pharm to make sure they got the Rx and there is no problem filling it, and let pt know it should be ready in about an hour, per pharm.

## 2015-06-01 NOTE — Telephone Encounter (Signed)
Pt needs this refill urgently, it helps her with her breathing. She is completely out of this med. CB # [id number]

## 2015-06-08 ENCOUNTER — Telehealth: Payer: Self-pay

## 2015-06-08 NOTE — Telephone Encounter (Signed)
Pt's hospice nurse called to tell Dr. Everlene Farrier that pt's blood pressure is still high. It ran around 162/70 yesterday. She wanted to know if you wanted to add something to her med list for this.   Windy Canny [id number]

## 2015-06-08 NOTE — Telephone Encounter (Signed)
I would just monitor her pressure for now. We will increase her blood pressure medications if it stays high. Please check her blood pressure every visit and record over the next 2 weeks.

## 2015-06-09 ENCOUNTER — Telehealth: Payer: Self-pay | Admitting: Emergency Medicine

## 2015-06-09 ENCOUNTER — Telehealth: Payer: Self-pay

## 2015-06-09 NOTE — Telephone Encounter (Signed)
I called and spoke with the hospice nurse. We will see how she does with  her blood pressure in  response to the Lasix and treatment for her ongoing respiratory infection. If she continues to have elevated pressure we can consider a low-dose of a beta blocker but if we can hold off that would be better because of her ongoing lung disease.

## 2015-06-09 NOTE — Telephone Encounter (Signed)
Hospice nurse called to check on the request of bp meds from yesterday due to patient having elevated bp yesterday,and today pt is having swelling in legs and sob-the hospice nurse called the provider on call and they called in a prednizone tapper and lasix and z-pack  But nurse still would like a call back from dr Oletha Blend number [phone]

## 2015-06-09 NOTE — Telephone Encounter (Signed)
Dr Everlene Farrier, forwarding this FYI about edema and treatment plan by Hospice MD. I will call nurse back with your message on yesterday's message about BP and will forward that message on to you if she still wants to talk with you.

## 2015-06-09 NOTE — Telephone Encounter (Signed)
Called Livina back and gave her these instr's from Dr Everlene Farrier. She agreed to monitor over next two weeks and let us know if it remains elevated.

## 2015-06-10 ENCOUNTER — Emergency Department (HOSPITAL_COMMUNITY)

## 2015-06-10 ENCOUNTER — Inpatient Hospital Stay (HOSPITAL_COMMUNITY)
Admission: EM | Admit: 2015-06-10 | Discharge: 2015-06-13 | DRG: 190 | Attending: Internal Medicine | Admitting: Internal Medicine

## 2015-06-10 ENCOUNTER — Encounter (HOSPITAL_COMMUNITY): Payer: Self-pay | Admitting: Adult Health

## 2015-06-10 DIAGNOSIS — R0603 Acute respiratory distress: Secondary | ICD-10-CM | POA: Diagnosis present

## 2015-06-10 DIAGNOSIS — Z794 Long term (current) use of insulin: Secondary | ICD-10-CM

## 2015-06-10 DIAGNOSIS — J9811 Atelectasis: Secondary | ICD-10-CM | POA: Diagnosis present

## 2015-06-10 DIAGNOSIS — Z87891 Personal history of nicotine dependence: Secondary | ICD-10-CM | POA: Diagnosis not present

## 2015-06-10 DIAGNOSIS — Z9981 Dependence on supplemental oxygen: Secondary | ICD-10-CM

## 2015-06-10 DIAGNOSIS — T380X5A Adverse effect of glucocorticoids and synthetic analogues, initial encounter: Secondary | ICD-10-CM | POA: Diagnosis not present

## 2015-06-10 DIAGNOSIS — Z808 Family history of malignant neoplasm of other organs or systems: Secondary | ICD-10-CM | POA: Diagnosis not present

## 2015-06-10 DIAGNOSIS — Z885 Allergy status to narcotic agent status: Secondary | ICD-10-CM | POA: Diagnosis not present

## 2015-06-10 DIAGNOSIS — E1169 Type 2 diabetes mellitus with other specified complication: Secondary | ICD-10-CM | POA: Diagnosis present

## 2015-06-10 DIAGNOSIS — I252 Old myocardial infarction: Secondary | ICD-10-CM | POA: Diagnosis not present

## 2015-06-10 DIAGNOSIS — J441 Chronic obstructive pulmonary disease with (acute) exacerbation: Secondary | ICD-10-CM | POA: Diagnosis not present

## 2015-06-10 DIAGNOSIS — E785 Hyperlipidemia, unspecified: Secondary | ICD-10-CM | POA: Diagnosis present

## 2015-06-10 DIAGNOSIS — J9600 Acute respiratory failure, unspecified whether with hypoxia or hypercapnia: Secondary | ICD-10-CM | POA: Diagnosis present

## 2015-06-10 DIAGNOSIS — Z515 Encounter for palliative care: Secondary | ICD-10-CM | POA: Diagnosis not present

## 2015-06-10 DIAGNOSIS — Z801 Family history of malignant neoplasm of trachea, bronchus and lung: Secondary | ICD-10-CM | POA: Diagnosis not present

## 2015-06-10 DIAGNOSIS — R0600 Dyspnea, unspecified: Secondary | ICD-10-CM | POA: Diagnosis not present

## 2015-06-10 DIAGNOSIS — J440 Chronic obstructive pulmonary disease with acute lower respiratory infection: Secondary | ICD-10-CM | POA: Diagnosis not present

## 2015-06-10 DIAGNOSIS — J189 Pneumonia, unspecified organism: Secondary | ICD-10-CM | POA: Diagnosis not present

## 2015-06-10 DIAGNOSIS — Z7189 Other specified counseling: Secondary | ICD-10-CM | POA: Diagnosis not present

## 2015-06-10 DIAGNOSIS — I10 Essential (primary) hypertension: Secondary | ICD-10-CM | POA: Diagnosis present

## 2015-06-10 DIAGNOSIS — F419 Anxiety disorder, unspecified: Secondary | ICD-10-CM | POA: Diagnosis present

## 2015-06-10 DIAGNOSIS — D72829 Elevated white blood cell count, unspecified: Secondary | ICD-10-CM | POA: Diagnosis present

## 2015-06-10 DIAGNOSIS — E119 Type 2 diabetes mellitus without complications: Secondary | ICD-10-CM | POA: Diagnosis not present

## 2015-06-10 DIAGNOSIS — E873 Alkalosis: Secondary | ICD-10-CM | POA: Diagnosis present

## 2015-06-10 DIAGNOSIS — Z91040 Latex allergy status: Secondary | ICD-10-CM

## 2015-06-10 DIAGNOSIS — K5909 Other constipation: Secondary | ICD-10-CM

## 2015-06-10 DIAGNOSIS — Z66 Do not resuscitate: Secondary | ICD-10-CM | POA: Diagnosis present

## 2015-06-10 DIAGNOSIS — J9601 Acute respiratory failure with hypoxia: Secondary | ICD-10-CM | POA: Diagnosis not present

## 2015-06-10 DIAGNOSIS — Z91048 Other nonmedicinal substance allergy status: Secondary | ICD-10-CM | POA: Diagnosis not present

## 2015-06-10 DIAGNOSIS — Z833 Family history of diabetes mellitus: Secondary | ICD-10-CM | POA: Diagnosis not present

## 2015-06-10 DIAGNOSIS — Z7951 Long term (current) use of inhaled steroids: Secondary | ICD-10-CM | POA: Diagnosis not present

## 2015-06-10 DIAGNOSIS — Z881 Allergy status to other antibiotic agents status: Secondary | ICD-10-CM

## 2015-06-10 DIAGNOSIS — Z888 Allergy status to other drugs, medicaments and biological substances status: Secondary | ICD-10-CM

## 2015-06-10 DIAGNOSIS — I2510 Atherosclerotic heart disease of native coronary artery without angina pectoris: Secondary | ICD-10-CM | POA: Diagnosis not present

## 2015-06-10 DIAGNOSIS — Z79899 Other long term (current) drug therapy: Secondary | ICD-10-CM

## 2015-06-10 DIAGNOSIS — J449 Chronic obstructive pulmonary disease, unspecified: Secondary | ICD-10-CM | POA: Diagnosis not present

## 2015-06-10 DIAGNOSIS — Z8249 Family history of ischemic heart disease and other diseases of the circulatory system: Secondary | ICD-10-CM | POA: Diagnosis not present

## 2015-06-10 DIAGNOSIS — R069 Unspecified abnormalities of breathing: Secondary | ICD-10-CM | POA: Diagnosis not present

## 2015-06-10 LAB — CBC WITH DIFFERENTIAL/PLATELET
BASOS PCT: 0 %
Basophils Absolute: 0 10*3/uL (ref 0.0–0.1)
Eosinophils Absolute: 0.1 10*3/uL (ref 0.0–0.7)
Eosinophils Relative: 0 %
HEMATOCRIT: 36.3 % (ref 36.0–46.0)
HEMOGLOBIN: 11.2 g/dL — AB (ref 12.0–15.0)
LYMPHS ABS: 2.3 10*3/uL (ref 0.7–4.0)
LYMPHS PCT: 15 %
MCH: 27.9 pg (ref 26.0–34.0)
MCHC: 30.9 g/dL (ref 30.0–36.0)
MCV: 90.3 fL (ref 78.0–100.0)
Monocytes Absolute: 1.2 10*3/uL — ABNORMAL HIGH (ref 0.1–1.0)
Monocytes Relative: 8 %
NEUTROS ABS: 12.2 10*3/uL — AB (ref 1.7–7.7)
NEUTROS PCT: 77 %
Platelets: 368 10*3/uL (ref 150–400)
RBC: 4.02 MIL/uL (ref 3.87–5.11)
RDW: 13.9 % (ref 11.5–15.5)
WBC: 15.8 10*3/uL — ABNORMAL HIGH (ref 4.0–10.5)

## 2015-06-10 LAB — BASIC METABOLIC PANEL
ANION GAP: 11 (ref 5–15)
BUN: 19 mg/dL (ref 6–20)
CALCIUM: 9.3 mg/dL (ref 8.9–10.3)
CHLORIDE: 97 mmol/L — AB (ref 101–111)
CO2: 32 mmol/L (ref 22–32)
CREATININE: 0.54 mg/dL (ref 0.44–1.00)
GFR calc non Af Amer: >60 mL/min (ref >60)
Glucose, Bld: 123 mg/dL — ABNORMAL HIGH (ref 65–99)
Potassium: 3.8 mmol/L (ref 3.5–5.1)
SODIUM: 140 mmol/L (ref 135–145)

## 2015-06-10 LAB — I-STAT ARTERIAL BLOOD GAS, ED
ACID-BASE EXCESS: 7 mmol/L — AB (ref 0.0–2.0)
BICARBONATE: 33.8 meq/L — AB (ref 20.0–24.0)
O2 SAT: 99 %
PH ART: 7.404 (ref 7.350–7.450)
TCO2: 35 mmol/L (ref 0–100)
pCO2 arterial: 54 mmHg — ABNORMAL HIGH (ref 35.0–45.0)
pO2, Arterial: 161 mmHg — ABNORMAL HIGH (ref 80.0–100.0)

## 2015-06-10 LAB — GLUCOSE, CAPILLARY
Glucose-Capillary: 123 mg/dL — ABNORMAL HIGH (ref 65–99)
Glucose-Capillary: 227 mg/dL — ABNORMAL HIGH (ref 65–99)
Glucose-Capillary: 361 mg/dL — ABNORMAL HIGH (ref 65–99)
Glucose-Capillary: 320 mg/dL — ABNORMAL HIGH (ref 65–99)

## 2015-06-10 LAB — CBG MONITORING, ED
GLUCOSE-CAPILLARY: 449 mg/dL — AB (ref 65–99)
Glucose-Capillary: 460 mg/dL — ABNORMAL HIGH (ref 65–99)
Glucose-Capillary: 334 mg/dL — ABNORMAL HIGH (ref 65–99)
Glucose-Capillary: 235 mg/dL — ABNORMAL HIGH (ref 65–99)

## 2015-06-10 LAB — COMPREHENSIVE METABOLIC PANEL
ALK PHOS: 74 U/L (ref 38–126)
ALT: 22 U/L (ref 14–54)
ANION GAP: 14 (ref 5–15)
AST: 26 U/L (ref 15–41)
Albumin: 3.6 g/dL (ref 3.5–5.0)
BILIRUBIN TOTAL: 0.4 mg/dL (ref 0.3–1.2)
BUN: 11 mg/dL (ref 6–20)
CALCIUM: 9.5 mg/dL (ref 8.9–10.3)
CO2: 30 mmol/L (ref 22–32)
Chloride: 95 mmol/L — ABNORMAL LOW (ref 101–111)
Creatinine, Ser: 0.59 mg/dL (ref 0.44–1.00)
GFR calc non Af Amer: >60 mL/min (ref >60)
GLUCOSE: 180 mg/dL — AB (ref 65–99)
Potassium: 3.9 mmol/L (ref 3.5–5.1)
SODIUM: 139 mmol/L (ref 135–145)
Total Protein: 6.3 g/dL — ABNORMAL LOW (ref 6.5–8.1)

## 2015-06-10 LAB — BRAIN NATRIURETIC PEPTIDE: B Natriuretic Peptide: 29.3 pg/mL (ref 0.0–100.0)

## 2015-06-10 LAB — TROPONIN I: Troponin I: <0.03 ng/mL (ref <0.031)

## 2015-06-10 MED ORDER — CETYLPYRIDINIUM CHLORIDE 0.05 % MT LIQD
7.0000 mL | Freq: Two times a day (BID) | OROMUCOSAL | Status: DC
  Administered 2015-06-10 – 2015-06-13 (×6): 7 mL via OROMUCOSAL

## 2015-06-10 MED ORDER — DEXTROSE 5 % IV SOLN: 500.0000 mg | INTRAVENOUS | Status: DC

## 2015-06-10 MED ORDER — DEXTROSE 5 % IV SOLN
500.0000 mg | INTRAVENOUS | Status: DC
  Administered 2015-06-10 – 2015-06-12 (×3): 500 mg via INTRAVENOUS
  Filled 2015-06-10 (×3): qty 500

## 2015-06-10 MED ORDER — INSULIN ASPART 100 UNIT/ML ~~LOC~~ SOLN
0.0000 [IU] | Freq: Three times a day (TID) | SUBCUTANEOUS | Status: DC
  Administered 2015-06-11: 9 [IU] via SUBCUTANEOUS
  Administered 2015-06-11: 2 [IU] via SUBCUTANEOUS
  Administered 2015-06-11: 3 [IU] via SUBCUTANEOUS
  Administered 2015-06-12: 5 [IU] via SUBCUTANEOUS

## 2015-06-10 MED ORDER — MORPHINE SULFATE (CONCENTRATE) 10 MG/0.5ML PO SOLN: 5.0000 mg | ORAL | Status: DC | PRN

## 2015-06-10 MED ORDER — INSULIN REGULAR BOLUS VIA INFUSION
0.0000 [IU] | Freq: Three times a day (TID) | INTRAVENOUS | Status: DC
  Filled 2015-06-10: qty 10

## 2015-06-10 MED ORDER — DEXTROSE 50 % IV SOLN: 25.0000 mL | INTRAVENOUS | Status: DC | PRN

## 2015-06-10 MED ORDER — DILTIAZEM HCL ER 240 MG PO CP24
240.0000 mg | ORAL_CAPSULE | Freq: Every day | ORAL | Status: DC
  Administered 2015-06-10 – 2015-06-11 (×2): 240 mg via ORAL
  Filled 2015-06-10 (×6): qty 1

## 2015-06-10 MED ORDER — INSULIN ASPART 100 UNIT/ML ~~LOC~~ SOLN
0.0000 [IU] | Freq: Three times a day (TID) | SUBCUTANEOUS | Status: DC
  Administered 2015-06-10: 15 [IU] via SUBCUTANEOUS
  Filled 2015-06-10: qty 1

## 2015-06-10 MED ORDER — DEXTROSE 5 % IV SOLN
1.0000 g | INTRAVENOUS | Status: DC
  Administered 2015-06-10 – 2015-06-12 (×3): 1 g via INTRAVENOUS
  Filled 2015-06-10 (×3): qty 10

## 2015-06-10 MED ORDER — IPRATROPIUM-ALBUTEROL 0.5-2.5 (3) MG/3ML IN SOLN
3.0000 mL | RESPIRATORY_TRACT | Status: DC
  Administered 2015-06-10 – 2015-06-13 (×20): 3 mL via RESPIRATORY_TRACT
  Filled 2015-06-10 (×19): qty 3

## 2015-06-10 MED ORDER — LORAZEPAM 0.5 MG PO TABS
0.5000 mg | ORAL_TABLET | Freq: Four times a day (QID) | ORAL | Status: DC | PRN
  Administered 2015-06-11 – 2015-06-13 (×4): 1 mg via ORAL
  Filled 2015-06-10 (×4): qty 2

## 2015-06-10 MED ORDER — ENOXAPARIN SODIUM 40 MG/0.4ML ~~LOC~~ SOLN
40.0000 mg | SUBCUTANEOUS | Status: DC
  Administered 2015-06-11: 40 mg via SUBCUTANEOUS
  Filled 2015-06-10 (×2): qty 0.4

## 2015-06-10 MED ORDER — INSULIN GLARGINE 100 UNIT/ML ~~LOC~~ SOLN
10.0000 [IU] | Freq: Every day | SUBCUTANEOUS | Status: DC
  Administered 2015-06-10: 10 [IU] via SUBCUTANEOUS
  Filled 2015-06-10 (×2): qty 0.1

## 2015-06-10 MED ORDER — LORAZEPAM 2 MG/ML IJ SOLN
INTRAMUSCULAR | Status: AC
  Administered 2015-06-10: 1 mg via INTRAVENOUS
  Filled 2015-06-10: qty 1

## 2015-06-10 MED ORDER — SODIUM CHLORIDE 0.9 % IV SOLN
INTRAVENOUS | Status: DC
Start: 2015-06-10 — End: 2015-06-12
  Administered 2015-06-10 – 2015-06-11 (×2): via INTRAVENOUS

## 2015-06-10 MED ORDER — SODIUM CHLORIDE 0.9 % IV SOLN
INTRAVENOUS | Status: DC
  Administered 2015-06-10: 2.7 [IU]/h via INTRAVENOUS
  Filled 2015-06-10: qty 2.5

## 2015-06-10 MED ORDER — PANTOPRAZOLE SODIUM 40 MG PO TBEC
40.0000 mg | DELAYED_RELEASE_TABLET | Freq: Every day | ORAL | Status: DC
  Administered 2015-06-11 – 2015-06-13 (×3): 40 mg via ORAL
  Filled 2015-06-10 (×4): qty 1

## 2015-06-10 MED ORDER — DOCUSATE SODIUM 100 MG PO CAPS
200.0000 mg | ORAL_CAPSULE | Freq: Every day | ORAL | Status: DC
  Administered 2015-06-10: 200 mg via ORAL
  Filled 2015-06-10: qty 2

## 2015-06-10 MED ORDER — GUAIFENESIN ER 600 MG PO TB12
1200.0000 mg | ORAL_TABLET | Freq: Two times a day (BID) | ORAL | Status: DC | PRN
Start: 2015-06-10 — End: 2015-06-13

## 2015-06-10 MED ORDER — ATORVASTATIN CALCIUM 10 MG PO TABS
10.0000 mg | ORAL_TABLET | Freq: Every day | ORAL | Status: DC
  Administered 2015-06-11: 10 mg via ORAL
  Filled 2015-06-10: qty 1

## 2015-06-10 MED ORDER — METHYLPREDNISOLONE SODIUM SUCC 125 MG IJ SOLR
125.0000 mg | Freq: Once | INTRAMUSCULAR | Status: AC
  Administered 2015-06-10: 125 mg via INTRAVENOUS
  Filled 2015-06-10: qty 2

## 2015-06-10 MED ORDER — MORPHINE SULFATE (PF) 2 MG/ML IV SOLN
1.0000 mg | INTRAVENOUS | Status: DC | PRN
  Administered 2015-06-10 (×2): 1 mg via INTRAVENOUS
  Filled 2015-06-10 (×2): qty 1

## 2015-06-10 MED ORDER — QUETIAPINE FUMARATE 25 MG PO TABS
25.0000 mg | ORAL_TABLET | Freq: Two times a day (BID) | ORAL | Status: DC
  Administered 2015-06-10: 25 mg via ORAL
  Filled 2015-06-10: qty 1

## 2015-06-10 MED ORDER — METHYLPREDNISOLONE SODIUM SUCC 125 MG IJ SOLR
80.0000 mg | Freq: Three times a day (TID) | INTRAMUSCULAR | Status: DC
  Administered 2015-06-11: 80 mg via INTRAVENOUS
  Filled 2015-06-10: qty 2

## 2015-06-10 MED ORDER — POTASSIUM CHLORIDE CRYS ER 20 MEQ PO TBCR: 40.0000 meq | EXTENDED_RELEASE_TABLET | ORAL | Status: DC

## 2015-06-10 MED ORDER — ALBUTEROL SULFATE (2.5 MG/3ML) 0.083% IN NEBU
2.5000 mg | INHALATION_SOLUTION | RESPIRATORY_TRACT | Status: DC | PRN
  Administered 2015-06-12: 2.5 mg via RESPIRATORY_TRACT
  Filled 2015-06-10: qty 3

## 2015-06-10 MED ORDER — SENNOSIDES-DOCUSATE SODIUM 8.6-50 MG PO TABS
2.0000 | ORAL_TABLET | Freq: Every day | ORAL | Status: DC
  Administered 2015-06-10 – 2015-06-12 (×3): 2 via ORAL
  Filled 2015-06-10 (×3): qty 2

## 2015-06-10 MED ORDER — LORAZEPAM 2 MG/ML IJ SOLN
1.0000 mg | Freq: Once | INTRAMUSCULAR | Status: AC
  Administered 2015-06-10: 1 mg via INTRAVENOUS

## 2015-06-10 MED ORDER — POLYETHYLENE GLYCOL 3350 17 G PO PACK: 17.0000 g | PACK | Freq: Every day | ORAL | Status: DC | PRN

## 2015-06-10 MED ORDER — DEXTROSE-NACL 5-0.45 % IV SOLN
INTRAVENOUS | Status: DC
  Administered 2015-06-10: 50 mL/h via INTRAVENOUS

## 2015-06-10 MED ORDER — HALOPERIDOL 5 MG PO TABS
5.0000 mg | ORAL_TABLET | ORAL | Status: DC | PRN
  Administered 2015-06-11: 5 mg via ORAL
  Filled 2015-06-10 (×2): qty 1

## 2015-06-10 NOTE — ED Notes (Signed)
Heart healthy/carb modified lunch tray ordered 

## 2015-06-10 NOTE — Progress Notes (Signed)
Full consult note to follow.  Patient seen and examined Chart reviewed Life limiting illness AECOPD.  Briefly discussed with patient regarding symptom management Recommendations made for prn po roxanol and also iv prn morphine.  She is still DNI but not DNR.  Unfortunately, sons had left by the time I got to the ED this afternoon.  Will need to schedule a family meeting with all concerned parties regarding additional goals of care discussions.  [facility] patient, appreciate follow up here.  Will continue to follow  Loistine Chance MD [phone] [place] palliative medicine team

## 2015-06-10 NOTE — ED Notes (Signed)
Presents with respiratory distress, began this evening. Took one albuterol treatment at home with no relief, EMS administered 5 and 0.5 of a duoneb, 125 solumedrol. Pt is tachypneic at 46 times a minute and using accessory muscles. Diminished in all field.

## 2015-06-10 NOTE — ED Notes (Signed)
CBG [phone] 

## 2015-06-10 NOTE — Progress Notes (Signed)
[place] [facility]-Tra[facility]-ED RN Visit  Patient seen in ED, laying on stretcher. She is bent over and breathing is labored.  Patient is alert and oriented, but having difficulty speaking due to her shortness of breath.  She is currently on 2L [place], but stated respiratory has been paged to but BiPap back on for relief of her symptoms.  Her respirations are shallow and in the 30's at this time.  [facility] was not notified prior to EMS being activated for symptoms of respiratory distress.  Patient is set to admit to SDU for COPD exacerbation, however no bed is available at this time.  Chest X-Ray showed infiltration or atelectasis in the right middle lung. IV Rocephin and Zithromax ordered. This will be a related, covered admission to her [facility] diagnosis of COPD.  No family present at time of visit.  [facility] will continue to follow.  Updated medication list and transfer summary with patient at bedside.  Please call with any hospice-related questions or concerns.  Thank You, Freddi Starr RN, [facility] Liaison [phone]

## 2015-06-10 NOTE — H&P (Signed)
History and Physical    Judith Boyer [id number] DOB: [birth year] DOA: 06/10/2015  Referring MD/NP/PA: EDP - Veryl Speak PCP: Jenny Reichmann, MD  Outpatient Specialists: Pulmonary - Christinia Gully, MD Patient coming from: [place]  Chief Complaint: cough, shortness of breath.  HPI: Judith Boyer is a 70 y.o. female with multiple medical problems not limited to HT, DM, CAD / MI and COPD on 3L at home. She is under the care of Hospice. Recently started on Cardizem. Does increased late Feb. Hospice nurse been in contact with PCP about ongoing SOB. Frequency of inhalers increased. Patient started on zpak, prednisone, and lasix yesterday. After one dose of each med patient had no improvement in breathing. She  and "was scared I wouldn't make it through the night". Patient denies cough, fever. She reports decreased urinary output. EMS found her tachypneic using accessory muscles. Placed on BiPAP.  ED Course:  Patient and this actually hypertensive at 153 over 127(spurious result?). BP now normal at 129 over 71. Heart rate in upper nineties to 104. Patient is afebrile.  Trop negative. BNP 29 WBC 15.8. Hemoglobin 11. 2 Right middle lobe with infiltrate versus atelectasis EKG reveals sinus tachycardia with artifact in several leads Started on BiPAP.  Blood gases: PH 7.4, PCO2 54, PO2 161, bicarb 33.8 1 mg of IV Ativan given times 1  Review of Systems:  Positive for chronic constipation. All others negative  Past Medical History  Diagnosis Date  . COPD (chronic obstructive pulmonary disease) ([place])   . Hypertension   . Coronary artery disease   . High cholesterol   . Myocardial infarction ([place]) 02/2011  . On home oxygen therapy     "2L; 24h/day" (12/20/2014)  . Pneumonia     "couple times" (02/08/2014)  . Type II diabetes mellitus ([place])     type 2  . Anemia   . History of blood transfusion 2014    "extremely anemic"    Past Surgical History  Procedure Laterality  Date  . Shoulder arthroscopy w/ rotator cuff repair Left   . Left heart catheterization with coronary angiogram N/A 02/09/2011    Procedure: LEFT HEART CATHETERIZATION WITH CORONARY ANGIOGRAM;  Surgeon: Birdie Riddle, MD;  Location: [facility] CATH LAB;  Service: Cardiovascular;  Laterality: N/A;  . Tonsillectomy    . Abdominal hysterectomy  1970's  . Coronary angioplasty with stent placement  12/2007    "2"  . Cardiac catheterization  02/2011     reports that she quit smoking about 2 years ago. Her smoking use included Cigarettes. She has a 50 pack-year smoking history. She has never used smokeless tobacco. She reports that she does not drink alcohol or use illicit drugs.  Allergies  Allergen Reactions  . Doxycycline Anaphylaxis  . Codeine Other (See Comments)    hyperactivity  . Dulera [Mometasone Furo-Formoterol Fum] Hives and Rash  . Oxycodone Other (See Comments)    Zonked out on 10 mg,  Pt can tolerate 5 mg  . Adhesive [Tape] Rash  . Latex Rash  . Lisinopril Cough    Family History  Problem Relation Age of Onset  . Heart disease Maternal Grandmother   . Brain cancer Brother   . Lung cancer Mother     smoked  . Diabetes Mother   . Diabetes Brother     Prior to Admission medications   Medication Sig Start Date End Date Taking? Authorizing Provider  acetaminophen (TYLENOL) 325 MG tablet Take 2 tablets (650  mg total) by mouth every 6 (six) hours as needed for mild pain (or Fever >/= 101). 12/31/14   Silver Huguenin Elgergawy, MD  albuterol (PROAIR HFA) 108 (90 BASE) MCG/ACT inhaler Inhale 2 puffs into the lungs every 4 (four) hours as needed for wheezing or shortness of breath.    Historical Provider, MD  arformoterol (BROVANA) 15 MCG/2ML NEBU Take 2 mLs (15 mcg total) by nebulization 2 (two) times daily. 12/31/14   Silver Huguenin Elgergawy, MD  atorvastatin (LIPITOR) 10 MG tablet Take 10 mg by mouth daily. 01/26/15   Historical Provider, MD  bisacodyl (DULCOLAX) 10 MG suppository Place 1  suppository (10 mg total) rectally daily as needed for moderate constipation. Patient not taking: Reported on 01/31/2015 12/31/14   Silver Huguenin Elgergawy, MD  diltiazem (CARDIZEM CD) 180 MG 24 hr capsule Take 1 capsule (180 mg total) by mouth daily. 03/16/15   [name], MD  diltiazem (DILACOR XR) 240 MG 24 hr capsule Take 1 capsule (240 mg total) by mouth daily. 04/05/15   [name], MD  feeding supplement, GLUCERNA SHAKE, (GLUCERNA SHAKE) LIQD Take 237 mLs by mouth 3 (three) times daily between meals. Patient not taking: Reported on 04/03/2015 12/31/14   Silver Huguenin Elgergawy, MD  Guaifenesin [facility] MAXIMUM STRENGTH) 1200 MG TB12 Take 1,200 mg by mouth 2 (two) times daily.    Historical Provider, MD  haloperidol (HALDOL) 5 MG tablet Take 5 mg by mouth every 4 (four) hours as needed for agitation (for anxiety or nausea). Reported on 04/03/2015 03/07/15   Historical Provider, MD  insulin aspart (NOVOLOG) 100 UNIT/ML injection Before each meal 3 times a day, 140-199 - 2 units, 200-250 - 4 units, 251-299 - 6 units,  300-349 - 8 units,  350 or above 10 units. Dispense syringes and needles as needed, Ok to switch to PEN if approved. Substitute to any brand approved. DX DM2, Code E11.65 Patient taking differently: Inject into the skin 3 (three) times daily with meals. Before each meal 3 times a day, 140-199 - 2 units, 200-250 - 4 units, 251-299 - 6 units,  300-349 - 8 units,  350 or above 10 units. Dispense syringes and needles as needed, Ok to switch to PEN if approved. Substitute to any brand approved. DX DM2, Code E11.65 02/05/15   Thurnell Lose, MD  ipratropium-albuterol (DUONEB) 0.5-2.5 (3) MG/3ML SOLN Take 3 mLs by nebulization 4 (four) times daily - after meals and at bedtime. 12/31/14   Silver Huguenin Elgergawy, MD  LANTUS 100 UNIT/ML injection INJECT 25 UNITS INTO SKIN EVERY MORNING 05/19/15   [name], MD  LORazepam (ATIVAN) 0.5 MG tablet Take 1-2 tablets (0.5-1 mg total) by mouth every 8 (eight)  hours as needed for anxiety. 04/19/15   [name], MD  Melatonin 3 MG TABS Take 3 mg by mouth at bedtime. Reported on 04/03/2015    Historical Provider, MD  montelukast (SINGULAIR) 10 MG tablet take 1 tablet by mouth once daily Patient taking differently: take 10 mg by mouth daily with supper 07/09/14   [name], MD  nitroGLYCERIN (NITROSTAT) 0.4 MG SL tablet Place 1 tablet (0.4 mg total) under the tongue every 5 (five) minutes x 3 doses as needed for chest pain. Patient not taking: Reported on 04/03/2015 02/07/12   Dixie Dials, MD  oxyCODONE (ROXICODONE) 5 MG immediate release tablet Take 1 tablet every 2-4 hours as needed for respiratory distress. 06/01/15   [name], MD  OXYGEN Inhale  3 L into the lungs continuous.     Historical Provider, MD  pantoprazole (PROTONIX) 40 MG tablet Take 40 mg by mouth daily. Reported on 04/03/2015    Historical Provider, MD  polyethylene glycol (MIRALAX / GLYCOLAX) packet Take 17 g by mouth daily as needed (constipation). Reported on 04/03/2015    Historical Provider, MD  predniSONE (DELTASONE) 10 MG tablet Take 2 tablets (20 mg total) by mouth every morning. 03/21/15   Carlyle Dolly, MD  QUEtiapine (SEROQUEL) 25 MG tablet Take 25 mg by mouth at bedtime. Reported on 04/03/2015 03/07/15   Historical Provider, MD  senna-docusate (SENOKOT-S) 8.6-50 MG tablet Take 2 tablets by mouth at bedtime. Patient taking differently: Take 1 tablet by mouth 2 (two) times daily.  12/31/14   Silver Huguenin Elgergawy, MD  sodium chloride (OCEAN) 0.65 % SOLN nasal spray Place 1 spray into both nostrils as needed for congestion. 12/31/14   Albertine Patricia, MD    Physical Exam: Filed Vitals:   06/10/15 0500 06/10/15 0515 06/10/15 0523 06/10/15 0530  BP: 153/127 153/76 153/76 129/71  Pulse: 104 98 99 98  Temp: 97.2 F (36.2 C)     TempSrc: Axillary     Resp: 24 25 29 19   SpO2: 100% 100% 100% 100%    Constitutional: white female in NAD, calm, comfortable Filed Vitals:    06/10/15 0500 06/10/15 0515 06/10/15 0523 06/10/15 0530  BP: 153/127 153/76 153/76 129/71  Pulse: 104 98 99 98  Temp: 97.2 F (36.2 C)     TempSrc: Axillary     Resp: 24 25 29 19   SpO2: 100% 100% 100% 100%   Eyes: PER, lids and conjunctivae normal ENMT: Mucous membranes are moist. Posterior pharynx clear of any exudate or lesions.Normal dentition.  Neck: normal, supple, no masses Respiratory: Diminished breath sounds in both lungs. No wheezing hear.Slightly labored breathing on 3L.. No accessory muscle use.  Cardiovascular: Sinus tachycardia, + murmur . Trace BLE edema.   Abdomen: no tenderness, no masses palpated. No hepatosplenomegaly. Bowel sounds positive.  Musculoskeletal: no clubbing / cyanosis. No joint deformity upper and lower extremities. Good ROM, no contractures. Normal muscle tone.  Skin: no rashes, lesions, ulcers. No induration Neurologic: CN 2-12 grossly intact. Sensation intact, DTR normal. Strength 5/5 in all 4.  Psychiatric: Pleasant. Normal judgment and insight. Alert and oriented x 3. Normal mood.   Labs on Admission: I have personally reviewed following labs and imaging studies  CBC:  Recent Labs Lab 06/10/15 0504  WBC 15.8*  NEUTROABS 12.2*  HGB 11.2*  HCT 36.3  MCV 90.3  PLT 123XX123   Basic Metabolic Panel:  Recent Labs Lab 06/10/15 0504  NA 139  K 3.9  CL 95*  CO2 30  GLUCOSE 180*  BUN 11  CREATININE 0.59  CALCIUM 9.5    Liver Function Tests:  Recent Labs Lab 06/10/15 0504  AST 26  ALT 22  ALKPHOS 74  BILITOT 0.4  PROT 6.3*  ALBUMIN 3.6    Cardiac Enzymes:  Recent Labs Lab 06/10/15 0504  TROPONINI <0.03  adiological Exams on Admission: Dg Chest Port 1 View  06/10/2015  CLINICAL DATA:  Shortness of breath. EXAM: PORTABLE CHEST 1 VIEW COMPARISON:  03/20/2015 FINDINGS: Cardiac enlargement without vascular congestion. Emphysematous changes in the lungs. Suggestion of mild infiltration in the right middle lung possibly indicating  pneumonia or atelectasis. Prominent cardiac fat pad at the left apex. Calcification of the aorta. No pneumothorax. No blunting of costophrenic angles. IMPRESSION: Infiltration or  atelectasis in the right middle lung. Emphysematous changes in the lungs. Electronically Signed   By: Lucienne Capers M.D.   On: 06/10/2015 05:17    EKG: Independently reviewed.   EKG Interpretation  Date/Time:  Friday Jun 10 2015 04:56:45 EDT Ventricular Rate:  105 PR Interval:  139 QRS Duration: 102 QT Interval:  351 QTC Calculation: 464 R Axis:   80 Text Interpretation:  Sinus tachycardia Artifact in lead(s) I II III aVR aVL aVF V1 V4 V5 and baseline wander in lead(s) II III aVF V6 Confirmed by DELO  MD, DOUGLAS ([id number]) on 06/10/2015 5:01:51 AM     Assessment/Plan  Acute respiratory distress secondary to CAP and possible COPD exacerbation.Compensated respiratory alkalosis on ABGs -place in OBS - Stepdown in case she requiries BiPAP again.  CAP and COPD exacerbation orders utilized -Solumedrol 125mg  IV now then 80mg  Q8 hours -Zithromax and Rocephin -continue home Singular -Scheduled and prn nebulizers -Continue 02, resume home 3L as tolerated, prn BiPAP -follow up on sputum culture, strep pneumo urinary ag -repeat CXR in am to further evaluate right infiltrate vrs atelectasis -Palliative Medicine consult. Patient was previously DNR, now just wants to be DNI. Please redefine goals of treatment. Also, takes Hydrocodone at home for dyspnea, would Morphine be better.   Diabetes mellitus type 2, controlled, without complications  -hold basal insulin -CBG, SSI (moderate scale for high dose steroids)  Hypertension.  Conrolled in ED -continue home Cardizem  Chronic constipation -continue home stool softener -prn Miralax  Hyperlipidemia.  -continue home statin  CAD / MI / stents  DVT prophylaxis: Lovenox  Code Status:   CPR but no DNI Family Communication: None Disposition Plan:  Discharge Home   24-48 hours  Consults called: None Admission status: Observation  -  Stepdown.   Tye Savoy NP [facility] Pager [phone]  If 7PM-7AM, please contact night-coverage [email or web address] Password [email or web address]  06/10/2015, 7:16 AM     D

## 2015-06-10 NOTE — ED Notes (Signed)
Heart  Healthy, carb modified diet ordered

## 2015-06-10 NOTE — Progress Notes (Signed)
This is a no charge note   Pending admission per Dr. Stark Jock  70 year old lady with advanced COPD on 2 L oxygen at home, hypertension, hyperlipidemia, diabetes mellitus, CAD, myocardial infarction, who presents with cough and shortness of breath. Patient has decreased air movement and wheezing bilaterally per EDP. Chest x-ray showed RML infiltration versus atelectasis. WBC 15.8, temperature normal, stress and tachycardia, and tachypnea. Troponin negative. BiPAP is started in ED. Accepted to SDU for obs.  Ivor Costa, MD  [facility] Pager [phone]  If 7PM-7AM, please contact night-coverage [email or web address] Password [facility] 06/10/2015, 6:35 AM

## 2015-06-10 NOTE — ED Notes (Signed)
Pt cbg 235 at this time.

## 2015-06-10 NOTE — ED Provider Notes (Addendum)
CSN: [id number]     Arrival date & time 06/10/15  0455 History   First MD Initiated Contact with Patient 06/10/15 0454     No chief complaint on file.    (Consider location/radiation/quality/duration/timing/severity/associated sxs/prior Treatment) HPI Comments: Patient is a 70-year-old female with history of advanced COPD. She presents for evaluation of difficulty breathing. This came on abruptly approximately 2 hours prior to arrival. She was brought by EMS in respiratory distress. She has received Solu-Medrol and a breathing treatment. She denies any fevers, chills, or productive cough.  Patient is a 70 y.o. female presenting with shortness of breath. The history is provided by the patient.  Shortness of Breath Severity:  Severe Onset quality:  Sudden Duration:  2 hours Timing:  Constant Progression:  Worsening Chronicity:  Recurrent Relieved by:  Nothing Worsened by:  Nothing tried Ineffective treatments:  None tried Associated symptoms: no chest pain and no fever     Past Medical History  Diagnosis Date  . COPD (chronic obstructive pulmonary disease) ([place])   . Hypertension   . Coronary artery disease   . Shortness of breath   . High cholesterol   . Myocardial infarction ([place]) 02/2011  . On home oxygen therapy     "2L; 24h/day" (12/20/2014)  . Pneumonia     "couple times" (02/08/2014)  . Type II diabetes mellitus ([place])     type 2  . Anemia   . History of blood transfusion 2014    "extremely anemic"   Past Surgical History  Procedure Laterality Date  . Shoulder arthroscopy w/ rotator cuff repair Left   . Left heart catheterization with coronary angiogram N/A 02/09/2011    Procedure: LEFT HEART CATHETERIZATION WITH CORONARY ANGIOGRAM;  Surgeon: Birdie Riddle, MD;  Location:  CATH LAB;  Service: Cardiovascular;  Laterality: N/A;  . Tonsillectomy    . Abdominal hysterectomy  1970's  . Coronary angioplasty with stent placement  12/2007    "2"  . Cardiac catheterization   02/2011   Family History  Problem Relation Age of Onset  . Heart disease Maternal Grandmother   . Brain cancer Brother   . Lung cancer Mother     smoked  . Diabetes Mother   . Diabetes Brother    Social History  Substance Use Topics  . Smoking status: Former Smoker -- 1.00 packs/day for 50 years    Types: Cigarettes    Quit date: 01/10/2013  . Smokeless tobacco: Never Used  . Alcohol Use: No   OB History    No data available     Review of Systems  Constitutional: Negative for fever.  Respiratory: Positive for shortness of breath.   Cardiovascular: Negative for chest pain.  All other systems reviewed and are negative.     Allergies  Doxycycline; Codeine; Dulera; Oxycodone; Adhesive; Latex; and Lisinopril  Home Medications   Prior to Admission medications   Medication Sig Start Date End Date Taking? Authorizing Provider  acetaminophen (TYLENOL) 325 MG tablet Take 2 tablets (650 mg total) by mouth every 6 (six) hours as needed for mild pain (or Fever >/= 101). 12/31/14   Silver Huguenin Elgergawy, MD  albuterol (PROAIR HFA) 108 (90 BASE) MCG/ACT inhaler Inhale 2 puffs into the lungs every 4 (four) hours as needed for wheezing or shortness of breath.    Historical Provider, MD  arformoterol (BROVANA) 15 MCG/2ML NEBU Take 2 mLs (15 mcg total) by nebulization 2 (two) times daily. 12/31/14   Albertine Patricia, MD  atorvastatin (  LIPITOR) 10 MG tablet Take 10 mg by mouth daily. 01/26/15   Historical Provider, MD  bisacodyl (DULCOLAX) 10 MG suppository Place 1 suppository (10 mg total) rectally daily as needed for moderate constipation. Patient not taking: Reported on 01/31/2015 12/31/14   Silver Huguenin Elgergawy, MD  diltiazem (CARDIZEM CD) 180 MG 24 hr capsule Take 1 capsule (180 mg total) by mouth daily. 03/16/15   [name], MD  diltiazem (DILACOR XR) 240 MG 24 hr capsule Take 1 capsule (240 mg total) by mouth daily. 04/05/15   [name], MD  feeding supplement, GLUCERNA SHAKE,  (GLUCERNA SHAKE) LIQD Take 237 mLs by mouth 3 (three) times daily between meals. Patient not taking: Reported on 04/03/2015 12/31/14   Silver Huguenin Elgergawy, MD  Guaifenesin [facility] MAXIMUM STRENGTH) 1200 MG TB12 Take 1,200 mg by mouth 2 (two) times daily.    Historical Provider, MD  haloperidol (HALDOL) 5 MG tablet Take 5 mg by mouth every 4 (four) hours as needed for agitation (for anxiety or nausea). Reported on 04/03/2015 03/07/15   Historical Provider, MD  insulin aspart (NOVOLOG) 100 UNIT/ML injection Before each meal 3 times a day, 140-199 - 2 units, 200-250 - 4 units, 251-299 - 6 units,  300-349 - 8 units,  350 or above 10 units. Dispense syringes and needles as needed, Ok to switch to PEN if approved. Substitute to any brand approved. DX DM2, Code E11.65 Patient taking differently: Inject into the skin 3 (three) times daily with meals. Before each meal 3 times a day, 140-199 - 2 units, 200-250 - 4 units, 251-299 - 6 units,  300-349 - 8 units,  350 or above 10 units. Dispense syringes and needles as needed, Ok to switch to PEN if approved. Substitute to any brand approved. DX DM2, Code E11.65 02/05/15   Thurnell Lose, MD  ipratropium-albuterol (DUONEB) 0.5-2.5 (3) MG/3ML SOLN Take 3 mLs by nebulization 4 (four) times daily - after meals and at bedtime. 12/31/14   Silver Huguenin Elgergawy, MD  LANTUS 100 UNIT/ML injection INJECT 25 UNITS INTO SKIN EVERY MORNING 05/19/15   [name], MD  LORazepam (ATIVAN) 0.5 MG tablet Take 1-2 tablets (0.5-1 mg total) by mouth every 8 (eight) hours as needed for anxiety. 04/19/15   [name], MD  Melatonin 3 MG TABS Take 3 mg by mouth at bedtime. Reported on 04/03/2015    Historical Provider, MD  montelukast (SINGULAIR) 10 MG tablet take 1 tablet by mouth once daily Patient taking differently: take 10 mg by mouth daily with supper 07/09/14   [name], MD  nitroGLYCERIN (NITROSTAT) 0.4 MG SL tablet Place 1 tablet (0.4 mg total) under the tongue every 5 (five)  minutes x 3 doses as needed for chest pain. Patient not taking: Reported on 04/03/2015 02/07/12   Dixie Dials, MD  oxyCODONE (ROXICODONE) 5 MG immediate release tablet Take 1 tablet every 2-4 hours as needed for respiratory distress. 06/01/15   [name], MD  OXYGEN Inhale 3 L into the lungs continuous.     Historical Provider, MD  pantoprazole (PROTONIX) 40 MG tablet Take 40 mg by mouth daily. Reported on 04/03/2015    Historical Provider, MD  polyethylene glycol (MIRALAX / GLYCOLAX) packet Take 17 g by mouth daily as needed (constipation). Reported on 04/03/2015    Historical Provider, MD  predniSONE (DELTASONE) 10 MG tablet Take 2 tablets (20 mg total) by mouth every morning. 03/21/15   Carlyle Dolly, MD  QUEtiapine (  SEROQUEL) 25 MG tablet Take 25 mg by mouth at bedtime. Reported on 04/03/2015 03/07/15   Historical Provider, MD  senna-docusate (SENOKOT-S) 8.6-50 MG tablet Take 2 tablets by mouth at bedtime. Patient taking differently: Take 1 tablet by mouth 2 (two) times daily.  12/31/14   Silver Huguenin Elgergawy, MD  sodium chloride (OCEAN) 0.65 % SOLN nasal spray Place 1 spray into both nostrils as needed for congestion. 12/31/14   Albertine Patricia, MD   There were no vitals taken for this visit. Physical Exam  Constitutional: She is oriented to person, place, and time. No distress.  Patient is thin and somewhat cachectic.  HENT:  Head: Normocephalic and atraumatic.  Neck: Normal range of motion. Neck supple.  Cardiovascular: Normal rate and regular rhythm.  Exam reveals no gallop and no friction rub.   No murmur heard. Pulmonary/Chest: She is in respiratory distress. She has no wheezes.  She has been moderate to severe respiratory distress. She appears anxious. Air movement is poor.  Abdominal: Soft. Bowel sounds are normal. She exhibits no distension. There is no tenderness.  Musculoskeletal: Normal range of motion. She exhibits no edema.  Neurological: She is alert and oriented to  person, place, and time.  Skin: Skin is warm and dry. She is not diaphoretic.  Nursing note and vitals reviewed.   ED Course  Procedures (including critical care time) Labs Review Labs Reviewed  COMPREHENSIVE METABOLIC PANEL  CBC WITH DIFFERENTIAL/PLATELET  BRAIN NATRIURETIC PEPTIDE  BLOOD GAS, ARTERIAL  I-STAT TROPOININ, ED    Imaging Review No results found. I have personally reviewed and evaluated these images and lab results as part of my medical decision-making.   EKG Interpretation   Date/Time:  Friday Jun 10 2015 04:56:45 EDT Ventricular Rate:  105 PR Interval:  139 QRS Duration: 102 QT Interval:  351 QTC Calculation: 464 R Axis:   80 Text Interpretation:  Sinus tachycardia Artifact in lead(s) I II III aVR  aVL aVF V1 V4 V5 and baseline wander in lead(s) II III aVF V6 Confirmed by  Lissandro Dilorenzo  MD, Arianni Gallego ([id number]) on 06/10/2015 5:01:51 AM      MDM   Final diagnoses:  None    Patient with history of COPD presents with difficulty breathing/respiratory distress. She was given Solu-Medrol and albuterol by EMS, but arrived dyspneic and anxious. Workup was initiated and her breathing was treated with additional nebulizer treatments and BiPAP. She was also given Ativan for her anxiety. An ABG was obtained which revealed a compensated respiratory acidosis. Her PCO2 was 54.  Her status eventually began to improve and she was able to be weaned off of BiPAP. She is now on oxygen by nasal cannula and appears to be tolerating this well. I've spoken with Dr. Blaine Hamper from the hospitalist service who agrees to admit the patient.  CRITICAL CARE Performed by: Veryl Speak Total critical care time: 30 minutes Critical care time was exclusive of separately billable procedures and treating other patients. Critical care was necessary to treat or prevent imminent or life-threatening deterioration. Critical care was time spent personally by me on the following activities: development of  treatment plan with patient and/or surrogate as well as nursing, discussions with consultants, evaluation of patient's response to treatment, examination of patient, obtaining history from patient or surrogate, ordering and performing treatments and interventions, ordering and review of laboratory studies, ordering and review of radiographic studies, pulse oximetry and re-evaluation of patient's condition.   Veryl Speak, MD 06/10/15 [id number]  Veryl Speak, MD  06/10/15 0649 

## 2015-06-10 NOTE — ED Notes (Signed)
Admitting MD returned call, pt to go on glucostabilizer

## 2015-06-11 ENCOUNTER — Encounter (HOSPITAL_COMMUNITY): Payer: Self-pay | Admitting: Surgery

## 2015-06-11 ENCOUNTER — Inpatient Hospital Stay (HOSPITAL_COMMUNITY)

## 2015-06-11 DIAGNOSIS — I2510 Atherosclerotic heart disease of native coronary artery without angina pectoris: Secondary | ICD-10-CM

## 2015-06-11 DIAGNOSIS — J9601 Acute respiratory failure with hypoxia: Secondary | ICD-10-CM

## 2015-06-11 DIAGNOSIS — Z7189 Other specified counseling: Secondary | ICD-10-CM | POA: Insufficient documentation

## 2015-06-11 LAB — BASIC METABOLIC PANEL
Anion gap: 12 (ref 5–15)
Anion gap: 8 (ref 5–15)
BUN: 22 mg/dL — AB (ref 6–20)
BUN: 23 mg/dL — AB (ref 6–20)
CALCIUM: 8.6 mg/dL — AB (ref 8.9–10.3)
CALCIUM: 9.2 mg/dL (ref 8.9–10.3)
CO2: 31 mmol/L (ref 22–32)
CO2: 31 mmol/L (ref 22–32)
CREATININE: 0.52 mg/dL (ref 0.44–1.00)
CREATININE: 0.59 mg/dL (ref 0.44–1.00)
Chloride: 99 mmol/L — ABNORMAL LOW (ref 101–111)
Chloride: 101 mmol/L (ref 101–111)
Glucose, Bld: 184 mg/dL — ABNORMAL HIGH (ref 65–99)
Glucose, Bld: 272 mg/dL — ABNORMAL HIGH (ref 65–99)
Potassium: 4.7 mmol/L (ref 3.5–5.1)
Potassium: 4.3 mmol/L (ref 3.5–5.1)
SODIUM: 140 mmol/L (ref 135–145)
SODIUM: 142 mmol/L (ref 135–145)

## 2015-06-11 LAB — GLUCOSE, CAPILLARY
GLUCOSE-CAPILLARY: 406 mg/dL — AB (ref 65–99)
Glucose-Capillary: 176 mg/dL — ABNORMAL HIGH (ref 65–99)
Glucose-Capillary: 176 mg/dL — ABNORMAL HIGH (ref 65–99)
Glucose-Capillary: 206 mg/dL — ABNORMAL HIGH (ref 65–99)
Glucose-Capillary: 238 mg/dL — ABNORMAL HIGH (ref 65–99)
Glucose-Capillary: 323 mg/dL — ABNORMAL HIGH (ref 65–99)

## 2015-06-11 LAB — CBC
HCT: 32.5 % — ABNORMAL LOW (ref 36.0–46.0)
Hemoglobin: 9.9 g/dL — ABNORMAL LOW (ref 12.0–15.0)
MCH: 27.4 pg (ref 26.0–34.0)
MCHC: 30.5 g/dL (ref 30.0–36.0)
MCV: 90 fL (ref 78.0–100.0)
PLATELETS: 311 10*3/uL (ref 150–400)
RBC: 3.61 MIL/uL — ABNORMAL LOW (ref 3.87–5.11)
RDW: 14.1 % (ref 11.5–15.5)
WBC: 17.9 10*3/uL — AB (ref 4.0–10.5)

## 2015-06-11 LAB — MRSA PCR SCREENING: MRSA by PCR: NEGATIVE

## 2015-06-11 LAB — HIV ANTIBODY (ROUTINE TESTING W REFLEX): HIV SCREEN 4TH GENERATION: NONREACTIVE

## 2015-06-11 LAB — STREP PNEUMONIAE URINARY ANTIGEN: Strep Pneumo Urinary Antigen: NEGATIVE

## 2015-06-11 MED ORDER — GLUCERNA SHAKE PO LIQD
237.0000 mL | Freq: Three times a day (TID) | ORAL | Status: DC
  Administered 2015-06-11 – 2015-06-13 (×6): 237 mL via ORAL

## 2015-06-11 MED ORDER — INSULIN GLARGINE 100 UNIT/ML ~~LOC~~ SOLN
25.0000 [IU] | Freq: Every day | SUBCUTANEOUS | Status: DC
  Administered 2015-06-11 – 2015-06-12 (×2): 25 [IU] via SUBCUTANEOUS
  Filled 2015-06-11 (×3): qty 0.25

## 2015-06-11 MED ORDER — METHYLPREDNISOLONE SODIUM SUCC 40 MG IJ SOLR
40.0000 mg | Freq: Three times a day (TID) | INTRAMUSCULAR | Status: DC
  Administered 2015-06-11 – 2015-06-12 (×2): 40 mg via INTRAVENOUS
  Filled 2015-06-11 (×2): qty 1

## 2015-06-11 MED ORDER — METHYLPREDNISOLONE SODIUM SUCC 125 MG IJ SOLR
60.0000 mg | Freq: Three times a day (TID) | INTRAMUSCULAR | Status: DC
  Filled 2015-06-11: qty 2

## 2015-06-11 MED ORDER — OXYCODONE HCL 5 MG PO TABS
5.0000 mg | ORAL_TABLET | ORAL | Status: DC | PRN
  Administered 2015-06-11 – 2015-06-12 (×2): 5 mg via ORAL
  Filled 2015-06-11 (×2): qty 1

## 2015-06-11 NOTE — Progress Notes (Addendum)
GIP RN visit: [facility] r  Related admission to hospice diagnosis COPD. Patient is a DNR, elected today with Dr. Rowe Pavy. Patient sitting up in the bed with oxygen in place states that she did not have a good night that they gave her morphine and seroquel and she did not sleep. Patient states that they changed her medications back to oxycodone and ativan and she is hoping to get some rest. Nurse and patient state that patient will be moved to another room today and hope to go home by Monday. Patient states that she lives at home alone and that if she can not take care of herself then she will need to go to [facility]. Patient has received one dose of PO oxycodone and one dose of PO ativan. Patient states she cant talk anymore, encouraged patient to let us know if there is anything she needs.   Please call with any hospice needs [phone] Thank you Ardath Sax RN BSN [facility]

## 2015-06-11 NOTE — Progress Notes (Signed)
Initial Nutrition Assessment  DOCUMENTATION CODES:   Not applicable  INTERVENTION:    Glucerna Shake po TID, each supplement provides 220 kcal and 10 grams of protein  NUTRITION DIAGNOSIS:   Increased nutrient needs related to chronic illness as evidenced by estimated needs.  GOAL:   Patient will meet greater than or equal to 90% of their needs  MONITOR:   PO intake, Supplement acceptance, I & O's  REASON FOR ASSESSMENT:   Consult COPD Protocol  ASSESSMENT:   70 y.o. female with history of HTN, DM, CAD / MI and COPD on 3L at home, followed by hospice at home, she presents with worsening dyspnea, most likely related to COPD exacerbation, early CAP.  Patient reports good PO intake. She has PO supplements at home, but does not drink it a lot because of her blood sugar. She does not usually drink Glucerna because it is too expensive. She agreed to receive Glucerna Shakes while hospitalized. Patient was in a lot of pain (arm IV site; IV removed) during RD visit; RN aware. Unable to complete Nutrition-Focused physical exam at this time due to patient's pain.   Diet Order:  Diet Heart Room service appropriate?: Yes; Fluid consistency:: Thin  Skin:  Reviewed, no issues  Last BM:  5/5  Height:   Ht Readings from Last 1 Encounters:  06/10/15 5' 1.5" (1.562 m)    Weight:   Wt Readings from Last 1 Encounters:  06/10/15 105 lb 11.2 oz (47.945 kg)    Ideal Body Weight:  48.9 kg  BMI:  Body mass index is 19.65 kg/(m^2).  Estimated Nutritional Needs:   Kcal:  1400-1600  Protein:  70-80 gm  Fluid:  1.5 L  EDUCATION NEEDS:   No education needs identified at this time  Molli Barrows, [place], [place], [place] Pager [phone] After Hours Pager [phone]

## 2015-06-11 NOTE — Progress Notes (Signed)
Patient transferring to 5W. Report called to receiving nurse. All questions answered. Patient notified family of transfer.

## 2015-06-11 NOTE — Progress Notes (Signed)
PT Cancellation Note  Patient Details Name: TYNISHIA AVARA MRN: [id number] DOB: Oct 31, 1945   Cancelled Treatment:    Reason Eval/Treat Not Completed: Medical issues which prohibited therapy (Nursing gave pt Ativan.  Asked PT to check back in pm.)Thanks.   Irwin Brakeman F 06/11/2015, 11:48 AM  [facility] [phone] (pager)

## 2015-06-11 NOTE — Evaluation (Signed)
Physical Therapy Evaluation Patient Details Name: Judith Boyer MRN: [id number] DOB: [birth year] Today's Date: 06/11/2015   History of Present Illness  70 y.o. female with past medical history of Diabetes, hypertension, chronic obstructive pulmonary disease-end-stage, patient enrolled in hospice since January 2017 admitted on 06/10/2015 with acute COPD exacerbation, possible pneumonia .   Clinical Impression  Pt admitted with above diagnosis. Pt currently with functional limitations due to the deficits listed below (see PT Problem List). Pt was able to ambulate with RW short distance. Pt is most likely close to her baseline but need to ensure that pt maintains her mobility while in hospital.  Will continue to follow.  Pt will benefit from skilled PT to increase their independence and safety with mobility to allow discharge to the venue listed below.      Follow Up Recommendations Home health PT;Supervision - Intermittent    Equipment Recommendations  None recommended by PT    Recommendations for Other Services       Precautions / Restrictions Precautions Precautions: Fall Restrictions Weight Bearing Restrictions: No      Mobility  Bed Mobility Overal bed mobility: Independent                Transfers Overall transfer level: Needs assistance Equipment used: Rolling walker (2 wheeled) Transfers: Sit to/from Stand Sit to Stand: Min guard         General transfer comment: Pt needed cues for hand placement. Able to power up without assist.   Ambulation/Gait Ambulation/Gait assistance: Min assist;+2 safety/equipment Ambulation Distance (Feet): 50 Feet Assistive device: Rolling walker (2 wheeled) Gait Pattern/deviations: Step-through pattern;Decreased stride length;Drifts right/left;Trunk flexed   Gait velocity interpretation: Below normal speed for age/gender General Gait Details: Pt was able to ambulate with RW without assist but jsut needed guard assist.   Overall good steady gait.  Gets anxious toward end of walk due to DOE 3/4.  Pt with poor endurance and decr safety awareness when she gets fatigued.   Stairs            Wheelchair Mobility    Modified Rankin (Stroke Patients Only)       Balance Overall balance assessment: Needs assistance;History of Falls Sitting-balance support: No upper extremity supported;Feet supported Sitting balance-Leahy Scale: Fair     Standing balance support: Bilateral upper extremity supported;During functional activity Standing balance-Leahy Scale: Poor Standing balance comment: reliant on RW for balance              High level balance activites: Sudden stops;Turns;Direction changes High Level Balance Comments: min guard assist with above             Pertinent Vitals/Pain Pain Assessment: No/denies pain  VSS on 3LO2.      Home Living Family/patient expects to be discharged to:: Private residence Living Arrangements: Children Available Help at Discharge: Family;Available PRN/intermittently Type of Home: House Home Access: Stairs to enter Entrance Stairs-Rails: Right Entrance Stairs-Number of Steps: 2 Home Layout: One level Home Equipment: Walker - 2 wheels;Shower seat Additional Comments: son works and lives next door    Prior Function Level of Independence: Independent         Comments: Pt was at rehab and then to [name] x 1 month.  Pt was able to go home with Hospice care for a few weeks.      Hand Dominance   Dominant Hand: Right    Extremity/Trunk Assessment   Upper Extremity Assessment: Defer to OT evaluation  Lower Extremity Assessment: Generalized weakness      Cervical / Trunk Assessment: Kyphotic  Communication   Communication: No difficulties;Other (comment) (pauses due to breathing)  Cognition Arousal/Alertness: Awake/alert Behavior During Therapy: Anxious Overall Cognitive Status: Within Functional Limits for tasks assessed                       General Comments      Exercises        Assessment/Plan    PT Assessment Patient needs continued PT services  PT Diagnosis Generalized weakness   PT Problem List Decreased activity tolerance;Decreased balance;Decreased mobility;Decreased knowledge of use of DME;Decreased safety awareness;Decreased knowledge of precautions;Cardiopulmonary status limiting activity;Decreased strength  PT Treatment Interventions DME instruction;Gait training;Stair training;Functional mobility training;Therapeutic activities;Therapeutic exercise;Balance training;Patient/family education   PT Goals (Current goals can be found in the Care Plan section) Acute Rehab PT Goals Patient Stated Goal: to get better PT Goal Formulation: With patient Time For Goal Achievement: 06/25/15 Potential to Achieve Goals: Good    Frequency Min 3X/week   Barriers to discharge Decreased caregiver support      Co-evaluation               End of Session Equipment Utilized During Treatment: Gait belt;Oxygen Activity Tolerance: Patient limited by fatigue Patient left: in chair;with call bell/phone within reach;with chair alarm set Nurse Communication: Mobility status         Time: 1344-1400 PT Time Calculation (min) (ACUTE ONLY): 16 min   Charges:   PT Evaluation $PT Eval Moderate Complexity: 1 Procedure     PT G CodesDenice Paradise Jun 30, 2015, 3:12 PM [facility] [phone] (pager)

## 2015-06-11 NOTE — Progress Notes (Addendum)
[facility] PROGRESS NOTE  ROBBY JIWANI [id number] DOB: 03/14/1945 DOA: 06/10/2015   PCP: Jenny Reichmann, MD     Assessment/Plan: Active Problems:   Hypertension   Diabetes mellitus type 2, controlled, without complications ([place])   COPD exacerbation ([place])   Acute respiratory distress ([place])   Acute respiratory failure ([place])   CAP (community acquired pneumonia)   CAD (coronary artery disease)   Chronic constipation   Brief summary 70 y.o. female with history of HTN, DM, CAD / MI and COPD on 3L at home, followed by hospice at home, she presents with worsening dyspnea, most likely related to COPD exacerbation, early CAP, requiring Bipap initially, started on IV steroids, IV Rocephin and azithromycin, palliative medicine consulted for goals of care and CODE STATUS(reports she is DO NOT INTUBATE only), as well for dyspnea symptoms management.  Assessment and plan Acute respiratory distress secondary to CAP and possible COPD exacerbation.Compensated respiratory alkalosis on ABGs Did not require BiPAP, transfer to telemetry CAP and COPD exacerbation orders utilized -Solumedrol 125mg  IV now then 80mg  Q8 hours, taper as tolerated -Zithromax and Rocephin -continue home Singular -Scheduled and prn nebulizers Uses 3 L of oxygen at home, prn BiPAP Negative strep pneumonia antigen, HIV negative, -repeat CXR in am to further evaluate right infiltrate vrs atelectasis -Palliative Medicine consult. Patient was previously DNR, now just wants to be DNI. Palliative care consulted to redefine goals of treatment.  Has been on hospice since January   Diabetes mellitus type 2, controlled, without complications  Now uncontrolled due to steroids , started lantus   -CBG, SSI (moderate scale for high dose steroids)  Hypertension. Conrolled in ED -continue home Cardizem  Chronic constipation -continue home stool softener -prn Miralax  Hyperlipidemia.  -continue home  statin  CAD / MI / stents   DVT prophylaxsis Lovenox  Code Status:  Partial code     Family Communication: Discussed in detail with the patient, all imaging results, lab results explained to the patient   Disposition Plan:  Transfer to telemetry      Consultants:  None  Procedures:  None  Antibiotics: Anti-infectives    Start     Dose/Rate Route Frequency Ordered Stop   06/10/15 0945  cefTRIAXone (ROCEPHIN) 1 g in dextrose 5 % 50 mL IVPB     1 g 100 mL/hr over 30 Minutes Intravenous Every 24 hours 06/10/15 0931     06/10/15 0945  azithromycin (ZITHROMAX) 500 mg in dextrose 5 % 250 mL IVPB     500 mg 250 mL/hr over 60 Minutes Intravenous Every 24 hours 06/10/15 0931     06/10/15 0945  azithromycin (ZITHROMAX) 500 mg in dextrose 5 % 250 mL IVPB  Status:  Discontinued     500 mg 250 mL/hr over 60 Minutes Intravenous Every 24 hours 06/10/15 0930 06/10/15 0932         HPI/Subjective: Patient states that she feels better than yesterday, less wheezing, shortness of breath  Objective: Filed Vitals:   06/11/15 0400 06/11/15 0600 06/11/15 0839 06/11/15 0848  BP: 131/45 123/58    Pulse:      Temp: 98.6 F (37 C)   97.6 F (36.4 C)  TempSrc: Oral   Oral  Resp: 20 17    Height:      Weight:      SpO2: 100% 100% 100%     Intake/Output Summary (Last 24 hours) at 06/11/15 0901 Last data filed at 06/11/15 0700  Gross  per 24 hour  Intake 896.06 ml  Output    250 ml  Net 646.06 ml    Exam:  Examination:  General exam: Appears calm and comfortable  Respiratory system: Clear to auscultation. Respiratory effort normal. Cardiovascular system: S1 & S2 heard, RRR. No JVD, murmurs, rubs, gallops or clicks. No pedal edema. Gastrointestinal system: Abdomen is nondistended, soft and nontender. No organomegaly or masses felt. Normal bowel sounds heard. Central nervous system: Alert and oriented. No focal neurological deficits. Extremities: Symmetric 5 x 5 power. Skin:  No rashes, lesions or ulcers Psychiatry: Judgement and insight appear normal. Mood & affect appropriate.     Data Reviewed: I have personally reviewed following labs and imaging studies  Micro Results Recent Results (from the past 240 hour(s))  MRSA PCR Screening     Status: None   Collection Time: 06/10/15  7:31 PM  Result Value Ref Range Status   MRSA by PCR NEGATIVE NEGATIVE Final    Comment:        The GeneXpert MRSA Assay (FDA approved for NASAL specimens only), is one component of a comprehensive MRSA colonization surveillance program. It is not intended to diagnose MRSA infection nor to guide or monitor treatment for MRSA infections.     Radiology Reports Dg Chest Port 1 View  06/10/2015  CLINICAL DATA:  Shortness of breath. EXAM: PORTABLE CHEST 1 VIEW COMPARISON:  03/20/2015 FINDINGS: Cardiac enlargement without vascular congestion. Emphysematous changes in the lungs. Suggestion of mild infiltration in the right middle lung possibly indicating pneumonia or atelectasis. Prominent cardiac fat pad at the left apex. Calcification of the aorta. No pneumothorax. No blunting of costophrenic angles. IMPRESSION: Infiltration or atelectasis in the right middle lung. Emphysematous changes in the lungs. Electronically Signed   By: Lucienne Capers M.D.   On: 06/10/2015 05:17     CBC  Recent Labs Lab 06/10/15 0504 06/11/15 0543  WBC 15.8* 17.9*  HGB 11.2* 9.9*  HCT 36.3 32.5*  PLT 368 311  MCV 90.3 90.0  MCH 27.9 27.4  MCHC 30.9 30.5  RDW 13.9 14.1  LYMPHSABS 2.3  --   MONOABS 1.2*  --   EOSABS 0.1  --   BASOSABS 0.0  --     Chemistries   Recent Labs Lab 06/10/15 0504 06/10/15 2010 06/11/15 0054 06/11/15 0543  NA 139 140 142 140  K 3.9 3.8 4.7 4.3  CL 95* 97* 99* 101  CO2 30 32 31 31  GLUCOSE 180* 123* 184* 272*  BUN 11 19 23* 22*  CREATININE 0.59 0.54 0.59 0.52  CALCIUM 9.5 9.3 9.2 8.6*  AST 26  --   --   --   ALT 22  --   --   --   ALKPHOS 74  --   --    --   BILITOT 0.4  --   --   --    ------------------------------------------------------------------------------------------------------------------ estimated creatinine clearance is 49.5 mL/min (by C-G formula based on Cr of 0.52). ------------------------------------------------------------------------------------------------------------------ No results for input(s): HGBA1C in the last 72 hours. ------------------------------------------------------------------------------------------------------------------ No results for input(s): CHOL, HDL, LDLCALC, TRIG, CHOLHDL, LDLDIRECT in the last 72 hours. ------------------------------------------------------------------------------------------------------------------ No results for input(s): TSH, T4TOTAL, T3FREE, THYROIDAB in the last 72 hours.  Invalid input(s): FREET3 ------------------------------------------------------------------------------------------------------------------ No results for input(s): VITAMINB12, FOLATE, FERRITIN, TIBC, IRON, RETICCTPCT in the last 72 hours.  Coagulation profile No results for input(s): INR, PROTIME in the last 168 hours.  No results for input(s): DDIMER in the last 72 hours.  Cardiac  Enzymes  Recent Labs Lab 06/10/15 0504  TROPONINI <0.03   ------------------------------------------------------------------------------------------------------------------ Invalid input(s): POCBNP   CBG:  Recent Labs Lab 06/10/15 2154 06/10/15 2259 06/11/15 0002 06/11/15 0156 06/11/15 0825  GLUCAP 361* 320* 206* 176* 176*       Studies: Dg Chest Port 1 View  06/10/2015  CLINICAL DATA:  Shortness of breath. EXAM: PORTABLE CHEST 1 VIEW COMPARISON:  03/20/2015 FINDINGS: Cardiac enlargement without vascular congestion. Emphysematous changes in the lungs. Suggestion of mild infiltration in the right middle lung possibly indicating pneumonia or atelectasis. Prominent cardiac fat pad at the left apex.  Calcification of the aorta. No pneumothorax. No blunting of costophrenic angles. IMPRESSION: Infiltration or atelectasis in the right middle lung. Emphysematous changes in the lungs. Electronically Signed   By: Lucienne Capers M.D.   On: 06/10/2015 05:17      Lab Results  Component Value Date   HGBA1C 7.2* 01/30/2015   HGBA1C 8.3 11/18/2014   HGBA1C 7.4* 06/18/2014   Lab Results  Component Value Date   LDLCALC 89 08/08/2012   CREATININE 0.52 06/11/2015       Scheduled Meds: . antiseptic oral rinse  7 mL Mouth Rinse BID  . atorvastatin  10 mg Oral q1800  . azithromycin  500 mg Intravenous Q24H  . cefTRIAXone (ROCEPHIN)  IV  1 g Intravenous Q24H  . diltiazem  240 mg Oral Daily  . enoxaparin (LOVENOX) injection  40 mg Subcutaneous Q24H  . insulin aspart  0-9 Units Subcutaneous TID WC  . insulin glargine  10 Units Subcutaneous QHS  . insulin regular  0-10 Units Intravenous TID WC  . ipratropium-albuterol  3 mL Nebulization Q4H  . methylPREDNISolone (SOLU-MEDROL) injection  80 mg Intravenous TID  . pantoprazole  40 mg Oral QAC breakfast  . potassium chloride  40 mEq Oral UD  . senna-docusate  2 tablet Oral QHS   Continuous Infusions: . sodium chloride 50 mL/hr at 06/10/15 2300     LOS: 1 day    Time spent: >30 MINS    [facility]  [facility] Pager [phone]. If 7PM-7AM, please contact night-coverage at [email or web address], password [email or web address] 06/11/2015, 9:01 AM  LOS: 1 day

## 2015-06-11 NOTE — Consult Note (Signed)
Consultation Note Date: 06/11/2015   Patient Name: Judith Boyer  DOB: Jan 07, 1946  MRN: [id number]  Age / Sex: 70 y.o., female  PCP: Judith Russian, MD Referring Physician: Reyne Dumas, MD  Reason for Consultation: Non pain symptom management and follow up of Hospice patient admitted to hospital  HPI/Patient Profile: 70 y.o. female  with past medical history of  Diabetes, hypertension, chronic obstructive pulmonary disease-end-stage, patient enrolled in hospice since January 2017 admitted on 06/10/2015 with acute COPD exacerbation, possible pneumonia .   Clinical Assessment and Goals of Care:  Judith Boyer is a 70 year old lady with advanced chronic obstructive pulmonary disease. She has underlying hypertension diabetes dyslipidemia coronary artery disease. She was seen by palliative services throughout the course of her multiple hospitalizations in August, November, December 2016. In January 2017 she was at [facility] place for several weeks. Subsequently, she has been followed through hospice of [place] at home.  Patient arrived in the emergency department in a.m. on 06-10-15 at [facility] emergency department with acute shortness of breath. She was admitted for COPD exacerbation and early community-acquired pneumonia. Initially she did require BiPAP. In light of the patient being involved activity in hospice services as well as having been seen by palliative services in the past, palliative consult requested for discussions, symptom management, goals of care clarification's.  Patient is currently admitted to intermediate care unit. She is awake and alert. She does not have as significant pursed lip breathing as was evidenced in initial consultation on 06-10-15. She is able to verbalize and speak in full sentences without pausing to catch her breath. She states that the work of her breathing has improved some. However  she still remains anxious and weak and complains of not having rested well overnight. Her oxygen requirements have improved. She was able to eat some this morning. Currently there is no family present at the bedside.  Goals of care discussions undertaken with the patient this morning. She elects to continue hospice services. She is happy with the services that are being provided. Additionally, she states that she does not know where the confusion came from but she has full understanding of DO NOT RESUSCITATE/DO NOT INTUBATE. She states very clearly that she has been told multiple times by various providers that if she ever got on a ventilator that she would not come off it. Additionally, she states that she has been told that the heart and lungs essentially function in conjunction with each other and she does not want compressions or any other mechanical aggressive means at end-of-life. Hence, will establish full DO NOT RESUSCITATE/DO NOT INTUBATE.  He is happy with this symptom management medications of oxycodone immediate release and Ativan. This is what she is used to getting to hospice at home. She is upset currently because her peripheral IV came out. She has bruises on her antecubital fossa on the right upper extremity from her recent peripheral IV that was placed and route. Have recommended cool compresses and elevation.  HCPOA  has 2 sons  SUMMARY OF RECOMMENDATIONS    Patient elects full DNR DNI Continue current mode of care Appropriate symptom management of dyspnea and anxiety with PO Oxy IR and Ativan Home with continuation of [facility] services on D/C  Code Status/Advance Care Planning:  DNR    Symptom Management:    oxy IR, Ativan PO  Palliative Prophylaxis:   Bowel Regimen     Psycho-social/Spiritual:   Desire for further Chaplaincy support:no  Additional Recommendations: Education on Hospice  Prognosis:   < 6 months  Discharge Planning: Home with Hospice       Primary Diagnoses: Present on Admission:  . COPD exacerbation ([place]) . Acute respiratory distress ([facility]) . Hypertension . Acute respiratory failure ([place])  I have reviewed the medical record, interviewed the patient and family, and examined the patient. The following aspects are pertinent.  Past Medical History  Diagnosis Date  . COPD (chronic obstructive pulmonary disease) ([place])   . Hypertension   . Coronary artery disease   . Shortness of breath   . High cholesterol   . Myocardial infarction ([place]) 02/2011  . On home oxygen therapy     "2L; 24h/day" (12/20/2014)  . Pneumonia     "couple times" (02/08/2014)  . Type II diabetes mellitus ([place])     type 2  . Anemia   . History of blood transfusion 2014    "extremely anemic"   Social History   Social History  . Marital Status: Widowed    Spouse Name: N/A  . Number of Children: N/A  . Years of Education: N/A   Social History Main Topics  . Smoking status: Former Smoker -- 1.00 packs/day for 50 years    Types: Cigarettes    Quit date: 01/10/2013  . Smokeless tobacco: Never Used  . Alcohol Use: No  . Drug Use: No  . Sexual Activity: No   Other Topics Concern  . None   Social History Narrative   Family History  Problem Relation Age of Onset  . Heart disease Maternal Grandmother   . Brain cancer Brother   . Lung cancer Mother     smoked  . Diabetes Mother   . Diabetes Brother    Scheduled Meds: . antiseptic oral rinse  7 mL Mouth Rinse BID  . atorvastatin  10 mg Oral q1800  . azithromycin  500 mg Intravenous Q24H  . cefTRIAXone (ROCEPHIN)  IV  1 g Intravenous Q24H  . diltiazem  240 mg Oral Daily  . enoxaparin (LOVENOX) injection  40 mg Subcutaneous Q24H  . feeding supplement (GLUCERNA SHAKE)  237 mL Oral TID BM  . insulin aspart  0-9 Units Subcutaneous TID WC  . insulin glargine  10 Units Subcutaneous QHS  . insulin regular  0-10 Units Intravenous TID WC  . ipratropium-albuterol  3 mL Nebulization Q4H  .  methylPREDNISolone (SOLU-MEDROL) injection  60 mg Intravenous TID  . pantoprazole  40 mg Oral QAC breakfast  . potassium chloride  40 mEq Oral UD  . senna-docusate  2 tablet Oral QHS   Continuous Infusions: . sodium chloride 50 mL/hr at 06/10/15 2300   PRN Meds:.albuterol, dextrose, guaiFENesin, haloperidol, LORazepam, oxyCODONE, polyethylene glycol Medications Prior to Admission:  Prior to Admission medications   Medication Sig Start Date End Date Taking? Authorizing Provider  azithromycin (ZITHROMAX) 500 MG tablet Take 500 mg by mouth daily. Started on 06-09-15 for 5 days   Yes Historical Provider, MD  budesonide (PULMICORT) 0.5 MG/2ML nebulizer solution Take 0.5 mg by nebulization 2 (  two) times daily.   Yes Historical Provider, MD  furosemide (LASIX) 20 MG tablet Take 20 mg by mouth daily. 06-09-15-06-14-15   Yes Historical Provider, MD  ipratropium-albuterol (DUONEB) 0.5-2.5 (3) MG/3ML SOLN Take 3 mLs by nebulization 4 (four) times daily - after meals and at bedtime. 12/31/14  Yes Albertine Patricia, MD  LORazepam (ATIVAN) 0.5 MG tablet Take 1-2 tablets (0.5-1 mg total) by mouth every 8 (eight) hours as needed for anxiety. Patient taking differently: Take 0.5-1 mg by mouth every 6 (six) hours as needed for anxiety.  04/19/15  Yes Judith Russian, MD  morphine (ROXANOL) 20 MG/ML concentrated solution Take 0.25-0.5 mg by mouth every 4 (four) hours as needed for severe pain (pain or shortness of breath).   Yes Historical Provider, MD  oxyCODONE (ROXICODONE) 5 MG immediate release tablet Take 1 tablet every 2-4 hours as needed for respiratory distress. Patient taking differently: Take 5 mg by mouth See admin instructions. Take 1 tablet every 2-4 hours as needed for respiratory distress. 06/01/15  Yes Judith Russian, MD  predniSONE (DELTASONE) 20 MG tablet Take 20-60 mg by mouth daily with breakfast. 60 mg for 2 days, 40 mg for 2 days, 20 mg for 1 day, back to regular doses. ( patient started taper on  06-09-15)   Yes Historical Provider, MD  prochlorperazine (COMPAZINE) 10 MG tablet Take 10 mg by mouth every 6 (six) hours as needed for nausea or vomiting.   Yes Historical Provider, MD  QUEtiapine (SEROQUEL) 25 MG tablet Take 25 mg by mouth 2 (two) times daily. Reported on 04/03/2015 03/07/15  Yes Historical Provider, MD  acetaminophen (TYLENOL) 325 MG tablet Take 2 tablets (650 mg total) by mouth every 6 (six) hours as needed for mild pain (or Fever >/= 101). Patient taking differently: Take 650 mg by mouth every 4 (four) hours as needed for mild pain (or Fever >/= 101).  12/31/14   Silver Huguenin Elgergawy, MD  albuterol (PROAIR HFA) 108 (90 BASE) MCG/ACT inhaler Inhale 2 puffs into the lungs every 4 (four) hours as needed for wheezing or shortness of breath.    Historical Provider, MD  arformoterol (BROVANA) 15 MCG/2ML NEBU Take 2 mLs (15 mcg total) by nebulization 2 (two) times daily. 12/31/14   Silver Huguenin Elgergawy, MD  atorvastatin (LIPITOR) 10 MG tablet Take 10 mg by mouth daily. 01/26/15   Historical Provider, MD  bisacodyl (DULCOLAX) 10 MG suppository Place 1 suppository (10 mg total) rectally daily as needed for moderate constipation. Patient not taking: Reported on 01/31/2015 12/31/14   Silver Huguenin Elgergawy, MD  diltiazem (CARDIZEM CD) 180 MG 24 hr capsule Take 1 capsule (180 mg total) by mouth daily. 03/16/15   Judith Russian, MD  diltiazem (DILACOR XR) 240 MG 24 hr capsule Take 1 capsule (240 mg total) by mouth daily. 04/05/15   Judith Russian, MD  feeding supplement, GLUCERNA SHAKE, (GLUCERNA SHAKE) LIQD Take 237 mLs by mouth 3 (three) times daily between meals. Patient not taking: Reported on 04/03/2015 12/31/14   Silver Huguenin Elgergawy, MD  Guaifenesin [facility] MAXIMUM STRENGTH) 1200 MG TB12 Take 1,200 mg by mouth 2 (two) times daily.    Historical Provider, MD  haloperidol (HALDOL) 5 MG tablet Take 5 mg by mouth every 4 (four) hours as needed for agitation (for anxiety or nausea). Reported on 04/03/2015  03/07/15   Historical Provider, MD  insulin aspart (NOVOLOG) 100 UNIT/ML injection Before each meal 3 times a day, 140-199 - 2 units,  200-250 - 4 units, 251-299 - 6 units,  300-349 - 8 units,  350 or above 10 units. Dispense syringes and needles as needed, Ok to switch to PEN if approved. Substitute to any brand approved. DX DM2, Code E11.65 Patient taking differently: Inject into the skin 3 (three) times daily with meals. 201-250 -2 units, 251-300 -  4 units,  301-350 - 6 units,  351-400 or above 8 units. Greater than 400 15 units. Check CBG before eating. 02/05/15   Thurnell Lose, MD  LANTUS 100 UNIT/ML injection INJECT 25 UNITS INTO SKIN EVERY MORNING 05/19/15   Judith Russian, MD  Melatonin 3 MG TABS Take 3 mg by mouth at bedtime. Reported on 04/03/2015    Historical Provider, MD  montelukast (SINGULAIR) 10 MG tablet take 1 tablet by mouth once daily Patient taking differently: take 10 mg by mouth daily with supper 07/09/14   Judith Russian, MD  nitroGLYCERIN (NITROSTAT) 0.4 MG SL tablet Place 1 tablet (0.4 mg total) under the tongue every 5 (five) minutes x 3 doses as needed for chest pain. Patient not taking: Reported on 04/03/2015 02/07/12   Dixie Dials, MD  OXYGEN Inhale 3 L into the lungs continuous.     Historical Provider, MD  pantoprazole (PROTONIX) 40 MG tablet Take 40 mg by mouth daily. Reported on 04/03/2015    Historical Provider, MD  polyethylene glycol (MIRALAX / GLYCOLAX) packet Take 17 g by mouth daily as needed (constipation). Reported on 04/03/2015    Historical Provider, MD  predniSONE (DELTASONE) 10 MG tablet Take 2 tablets (20 mg total) by mouth every morning. 03/21/15   Carlyle Dolly, MD  senna-docusate (SENOKOT-S) 8.6-50 MG tablet Take 2 tablets by mouth at bedtime. Patient taking differently: Take 2-4 tablets by mouth 2 (two) times daily.  12/31/14   Silver Huguenin Elgergawy, MD  sodium chloride (OCEAN) 0.65 % SOLN nasal spray Place 1 spray into both nostrils as needed for  congestion. 12/31/14   Albertine Patricia, MD   Allergies  Allergen Reactions  . Doxycycline Anaphylaxis  . Codeine Other (See Comments)    hyperactivity  . Dulera [Mometasone Furo-Formoterol Fum] Hives and Rash  . Oxycodone Other (See Comments)    Zonked out on 10 mg,  Pt can tolerate 5 mg  . Adhesive [Tape] Rash  . Latex Rash  . Lisinopril Cough   Review of Systems + for dyspnea, + for anxiety Physical Exam Weak frail elderly appearing lady Pursed lip breathing, better than when seen in ED initially on 06-10-15 Diminished breath sounds throughout, few faint scattered wheezes heard posterior bases S1 S2 Abdomen soft Trace edema Bruises UE, patient states is from peripheral IV Awake alert oriented.   Vital Signs: BP 144/75 mmHg  Pulse 100  Temp(Src) 97.8 F (36.6 C) (Oral)  Resp 17  Ht 5' 1.5" (1.562 m)  Wt 47.945 kg (105 lb 11.2 oz)  BMI 19.65 kg/m2  SpO2 100% Pain Assessment: No/denies pain   Pain Score: 0-No pain   SpO2: SpO2: 100 % O2 Device:SpO2: 100 % O2 Flow Rate: .O2 Flow Rate (L/min): 3 L/min  IO: Intake/output summary:  Intake/Output Summary (Last 24 hours) at 06/11/15 1230 Last data filed at 06/11/15 1100  Gross per 24 hour  Intake 696.06 ml  Output    550 ml  Net 146.06 ml    LBM: Last BM Date: 06/10/15 Baseline Weight: Weight: 47.945 kg (105 lb 11.2 oz) Most recent weight: Weight: 47.945 kg (105 lb 11.2  oz)     Palliative Assessment/Data:   Flowsheet Rows        Most Recent Value   Intake Tab    Referral Department  Hospitalist   Unit at Time of Referral  Intermediate Care Unit   Palliative Care Primary Diagnosis  Pulmonary   Date Notified  06/10/15   Palliative Care Type  Return patient Palliative Care   Reason for referral  Non-pain Symptom, Clarify Goals of Care   Date of Admission  06/10/15   Date first seen by Palliative Care  06/11/15   # of days IP prior to Palliative referral  0   Clinical Assessment    Palliative Performance  Scale Score  40%   Pain Max last 24 hours  4   Pain Min Last 24 hours  3   Dyspnea Max Last 24 Hours  8   Dyspnea Min Last 24 hours  7   Psychosocial & Spiritual Assessment    Palliative Care Outcomes    Patient/Family meeting held?  Yes   Who was at the meeting?  patient who is decisional.    Palliative Care follow-up planned  Yes, Home      Time In:  11 Time Out:  12 Time Total:  60 min  Greater than 50%  of this time was spent counseling and coordinating care related to the above assessment and plan.  Signed by: Loistine Chance, MD  [phone]  Please contact Palliative Medicine Team phone at [phone] for questions and concerns.  For individual provider: See Shea Evans

## 2015-06-12 LAB — CBC
HEMATOCRIT: 32.4 % — AB (ref 36.0–46.0)
HEMOGLOBIN: 9.9 g/dL — AB (ref 12.0–15.0)
MCH: 27.6 pg (ref 26.0–34.0)
MCHC: 30.6 g/dL (ref 30.0–36.0)
MCV: 90.3 fL (ref 78.0–100.0)
Platelets: 291 10*3/uL (ref 150–400)
RBC: 3.59 MIL/uL — AB (ref 3.87–5.11)
RDW: 13.9 % (ref 11.5–15.5)
WBC: 14.3 10*3/uL — ABNORMAL HIGH (ref 4.0–10.5)

## 2015-06-12 LAB — GLUCOSE, CAPILLARY
GLUCOSE-CAPILLARY: 271 mg/dL — AB (ref 65–99)
Glucose-Capillary: 283 mg/dL — ABNORMAL HIGH (ref 65–99)
Glucose-Capillary: 332 mg/dL — ABNORMAL HIGH (ref 65–99)
Glucose-Capillary: 261 mg/dL — ABNORMAL HIGH (ref 65–99)

## 2015-06-12 LAB — COMPREHENSIVE METABOLIC PANEL
ALK PHOS: 64 U/L (ref 38–126)
ALT: 17 U/L (ref 14–54)
AST: 16 U/L (ref 15–41)
Albumin: 2.9 g/dL — ABNORMAL LOW (ref 3.5–5.0)
Anion gap: 11 (ref 5–15)
BILIRUBIN TOTAL: 0.4 mg/dL (ref 0.3–1.2)
BUN: 15 mg/dL (ref 6–20)
CO2: 27 mmol/L (ref 22–32)
CREATININE: 0.51 mg/dL (ref 0.44–1.00)
Calcium: 8.8 mg/dL — ABNORMAL LOW (ref 8.9–10.3)
Chloride: 102 mmol/L (ref 101–111)
GFR calc Af Amer: >60 mL/min (ref >60)
Glucose, Bld: 304 mg/dL — ABNORMAL HIGH (ref 65–99)
Potassium: 4.3 mmol/L (ref 3.5–5.1)
Sodium: 140 mmol/L (ref 135–145)
TOTAL PROTEIN: 5.2 g/dL — AB (ref 6.5–8.1)

## 2015-06-12 MED ORDER — MORPHINE SULFATE (CONCENTRATE) 10 MG/0.5ML PO SOLN
1.0000 mg | ORAL | Status: DC | PRN
  Administered 2015-06-12 – 2015-06-13 (×9): 2 mg via ORAL
  Filled 2015-06-12 (×9): qty 0.5

## 2015-06-12 MED ORDER — LORAZEPAM 2 MG/ML PO CONC: 1.0000 mg | Freq: Four times a day (QID) | ORAL | Status: DC | PRN

## 2015-06-12 MED ORDER — MORPHINE SULFATE (CONCENTRATE) 10 MG/0.5ML PO SOLN
1.0000 mg | ORAL | Status: DC | PRN
  Administered 2015-06-12: 1 mg via ORAL
  Filled 2015-06-12: qty 0.5

## 2015-06-12 MED ORDER — PREDNISONE 20 MG PO TABS
40.0000 mg | ORAL_TABLET | Freq: Every day | ORAL | Status: DC
  Administered 2015-06-13: 40 mg via ORAL
  Filled 2015-06-12: qty 2

## 2015-06-12 MED ORDER — DILTIAZEM HCL ER COATED BEADS 240 MG PO CP24
240.0000 mg | ORAL_CAPSULE | Freq: Every day | ORAL | Status: DC
  Administered 2015-06-12 – 2015-06-13 (×2): 240 mg via ORAL
  Filled 2015-06-12 (×2): qty 1

## 2015-06-12 MED ORDER — METHYLPREDNISOLONE SODIUM SUCC 40 MG IJ SOLR
40.0000 mg | Freq: Three times a day (TID) | INTRAMUSCULAR | Status: AC
  Filled 2015-06-12: qty 1

## 2015-06-12 MED ORDER — PREDNISONE 20 MG PO TABS
40.0000 mg | ORAL_TABLET | Freq: Once | ORAL | Status: AC
  Administered 2015-06-12: 40 mg via ORAL
  Filled 2015-06-12: qty 2

## 2015-06-12 MED ORDER — AZITHROMYCIN 500 MG PO TABS
500.0000 mg | ORAL_TABLET | Freq: Every day | ORAL | Status: DC
  Administered 2015-06-12: 500 mg via ORAL
  Filled 2015-06-12: qty 1

## 2015-06-12 MED ORDER — INSULIN ASPART 100 UNIT/ML ~~LOC~~ SOLN
6.0000 [IU] | Freq: Three times a day (TID) | SUBCUTANEOUS | Status: DC
  Administered 2015-06-13: 6 [IU] via SUBCUTANEOUS

## 2015-06-12 MED ORDER — INSULIN ASPART 100 UNIT/ML ~~LOC~~ SOLN
0.0000 [IU] | Freq: Three times a day (TID) | SUBCUTANEOUS | Status: DC
  Administered 2015-06-12: 15 [IU] via SUBCUTANEOUS
  Administered 2015-06-12 – 2015-06-13 (×2): 11 [IU] via SUBCUTANEOUS

## 2015-06-12 MED ORDER — AZITHROMYCIN 500 MG PO TABS: 500.0000 mg | ORAL_TABLET | Freq: Every day | ORAL | Status: DC

## 2015-06-12 NOTE — Progress Notes (Addendum)
[facility] PROGRESS NOTE  Judith Boyer [id number] DOB: Nov 10, 1945 DOA: 06/10/2015   PCP: Jenny Reichmann, MD  Assessment/Plan: Active Problems:   Hypertension   Diabetes mellitus type 2, controlled, without complications (HCC)   COPD exacerbation ([place])   Acute respiratory distress ([place])   Acute respiratory failure ([place])   CAP (community acquired pneumonia)   CAD (coronary artery disease)   Chronic constipation   Goals of care, counseling/discussion   Brief summary 70 y.o. female with history of HTN, DM, CAD / MI and COPD on 3L at home, followed by hospice at home, she presents with worsening dyspnea, most likely related to COPD exacerbation, early CAP, requiring Bipap initially, started on IV steroids, IV Rocephin and azithromycin, palliative medicine consulted for goals of care and CODE STATUS(reports she is DO NOT INTUBATE only), as well for dyspnea symptoms management.  Assessment /plan Acute respiratory distress secondary to CAP and possible COPD exacerbation. Compensated respiratory alkalosis on ABGs Did not require BiPAP, transfer to telemetry CAP and COPD exacerbation orders utilized -Solumedrol 125mg  IV now then 80mg  Q8 hours--40 mg every tid--> expect transition to by mouth prednisone 06/13/2015 a.m. if symptomatically better -Zithromax and Rocephin -continue home Singular -Scheduled and prn nebulizers Uses 3 L of oxygen at home, prn BiPAP Negative strep pneumonia antigen, HIV negative, -repeat CXR in am to further evaluate right infiltrate vrs atelectasis -Palliative Medicine consulted as well as hospice -Patient tells me that she lives alone at home and that she realizes that her lung disease is not treatable by modern medical methods -She expresses fear of being short of breath and is anxious when I saw her in the room 06/12/2015 -She thinks that she needs to use a BiPAP machine tonight to get some rest -I had a long discussion with the patient  regarding use of BiPAP and the goals of BiPAP therapy to bridge from a decompensated 2 are well and compensated state in the short-term -I explained to her that the philosophy of hospice probably would not support ongoing BiPAP for acute on chronic respiratory failure with no hard endpoint being improvement -I expressed to the patient that although BiPAP is reasonable for shortness of breath, further modalities such as Roxanol and Ativan would also take weight work of breathing and I did mention that these medications take with the discomfort of breathing at the expense of building up CO2 narcosis and eventual demise -Patient ultimately understood my conversation with her. -I have asked hospice RN to come out and treat symptomatically as well as look into disposition issues with the patient as she is fearful, lives alone at home and is not able to put on BiPAP by herself even if she was able to get a BiPAP machine which I do not think is indicated -My belief is that the patient would be best served at a freestanding hospice facility for end-of-life care -I attempted to call her son Mazikeen Coody to update him as to patient's condition however did not receive a response. I am available to talk with him if needed later on today. -As per ISDA guidelines him a without any overt findings of pneumonia and chest x-ray, duration of treatment for COPD exacerbation is 3-5 days with a quinolone or azithromycin and we will transition IV ceftriaxone as well as azithromycin to by mouth azithromycin 06/12/2015  Diabetes mellitus type 2, controlled, without complications  Now uncontrolled due to steroids , started lantus   -CBG, SSI (moderate scale  for high dose steroids) -Sugars ranging from 280s to 300 range because of steroids -Patient eating very well. Adjust insulin to mealtime 8 units with meals in addition to sliding scale coverage and 25 of Lantus -Expect that Solu-Medrol decreasing doses and transition to  prednisone will help bring sugar down.  Leukocytosis -Secondary to IV steroids -No fever no chills and no documented x-ray evidence of pneumonia -See above discussion.  Hypertension. Conrolled in ED -continue home Cardizem 240 daily  Chronic constipation -continue home stool softener -prn Miralax -Senokot S if no stool  Hyperlipidemia.  -continue home statin  CAD / MI / stents   DVT prophylaxsis Lovenox Code Status:  DO NOT INTUBATE status-patient thinks that she would still want to use BiPAP See detailed discussion above Family Communication: Discussed in detail with the patient, I attempted to call the son as above with no response Disposition Plan:  Transfer to telemetry -->? [facility]/freestanding hospice if cannot have enough social support at home in the next 24 hours   Consultants: See notes Procedures:  None  Antibiotics: Anti-infectives    Start     Dose/Rate Route Frequency Ordered Stop   06/10/15 0945  cefTRIAXone (ROCEPHIN) 1 g in dextrose 5 % 50 mL IVPB     1 g 100 mL/hr over 30 Minutes Intravenous Every 24 hours 06/10/15 0931     06/10/15 0945  azithromycin (ZITHROMAX) 500 mg in dextrose 5 % 250 mL IVPB     500 mg 250 mL/hr over 60 Minutes Intravenous Every 24 hours 06/10/15 0931     06/10/15 0945  azithromycin (ZITHROMAX) 500 mg in dextrose 5 % 250 mL IVPB  Status:  Discontinued     500 mg 250 mL/hr over 60 Minutes Intravenous Every 24 hours 06/10/15 0930 06/10/15 0932         HPI/Subjective:  Alert oriented Anxious She is however getting an nab and seems anxious and scared I do not appreciate any overt wheezes and patient is able to speak in full sentences  Objective: Filed Vitals:   06/11/15 2310 06/12/15 0612 06/12/15 0851 06/12/15 1159  BP: 131/49 148/56    Pulse: 81 98    Temp: 97.9 F (36.6 C) 98.2 F (36.8 C)    TempSrc: Oral Oral    Resp: 18 18    Height:      Weight:      SpO2: 98% 98% 96% 99%    Intake/Output  Summary (Last 24 hours) at 06/12/15 1242 Last data filed at 06/12/15 0943  Gross per 24 hour  Intake 1357.5 ml  Output      0 ml  Net 1357.5 ml    Exam:  Examination:  General exam: Appears calm and comfortable  Respiratory system: Clear to auscultation. Decreased air entry bilaterally posteriorly, no overt wheeze Cardiovascular system: S1 & S2 heard, RRR. No JVD, murmurs, rubs, gallops or clicks. Gastrointestinal system: Abdomen is nondistended, soft and nontender. No organomegaly or masses felt. Central nervous system: Alert and oriented. No focal neurological deficits. Extremities: Symmetric 5 x 5 power. Skin: No lower extremity edema  Data Reviewed: I have personally reviewed following labs and imaging studies  Micro Results Recent Results (from the past 240 hour(s))  Culture, blood (routine x 2) Call MD if unable to obtain prior to antibiotics being given     Status: None (Preliminary result)   Collection Time: 06/10/15 10:15 AM  Result Value Ref Range Status   Specimen Description BLOOD LEFT HAND  Final  Special Requests BOTTLES DRAWN AEROBIC AND ANAEROBIC [place]  Final   Culture NO GROWTH 1 DAY  Final   Report Status PENDING  Incomplete  Culture, blood (routine x 2) Call MD if unable to obtain prior to antibiotics being given     Status: None (Preliminary result)   Collection Time: 06/10/15 10:20 AM  Result Value Ref Range Status   Specimen Description BLOOD RIGHT ANTECUBITAL  Final   Special Requests BOTTLES DRAWN AEROBIC AND ANAEROBIC [place]  Final   Culture NO GROWTH 1 DAY  Final   Report Status PENDING  Incomplete  MRSA PCR Screening     Status: None   Collection Time: 06/10/15  7:31 PM  Result Value Ref Range Status   MRSA by PCR NEGATIVE NEGATIVE Final    Comment:        The GeneXpert MRSA Assay (FDA approved for NASAL specimens only), is one component of a comprehensive MRSA colonization surveillance program. It is not intended to diagnose MRSA infection nor  to guide or monitor treatment for MRSA infections.     Radiology Reports Dg Chest 2 View  06/11/2015  CLINICAL DATA:  Progressive shortness of breath EXAM: CHEST  2 VIEW COMPARISON:  Jun 10, 2015 FINDINGS: Lungs are somewhat hyperexpanded. There is no demonstrable edema or consolidation. Heart is upper normal in size with pulmonary vascularity within normal limits. There is a stent in the left anterior descending coronary artery region. No adenopathy. IMPRESSION: No edema or consolidation.  Stable cardiac silhouette. Electronically Signed   By: Lowella Grip III M.D.   On: 06/11/2015 09:10   Dg Chest Port 1 View  06/10/2015  CLINICAL DATA:  Shortness of breath. EXAM: PORTABLE CHEST 1 VIEW COMPARISON:  03/20/2015 FINDINGS: Cardiac enlargement without vascular congestion. Emphysematous changes in the lungs. Suggestion of mild infiltration in the right middle lung possibly indicating pneumonia or atelectasis. Prominent cardiac fat pad at the left apex. Calcification of the aorta. No pneumothorax. No blunting of costophrenic angles. IMPRESSION: Infiltration or atelectasis in the right middle lung. Emphysematous changes in the lungs. Electronically Signed   By: Lucienne Capers M.D.   On: 06/10/2015 05:17     CBC  Recent Labs Lab 06/10/15 0504 06/11/15 0543 06/12/15 0608  WBC 15.8* 17.9* 14.3*  HGB 11.2* 9.9* 9.9*  HCT 36.3 32.5* 32.4*  PLT 368 311 291  MCV 90.3 90.0 90.3  MCH 27.9 27.4 27.6  MCHC 30.9 30.5 30.6  RDW 13.9 14.1 13.9  LYMPHSABS 2.3  --   --   MONOABS 1.2*  --   --   EOSABS 0.1  --   --   BASOSABS 0.0  --   --     Chemistries   Recent Labs Lab 06/10/15 0504 06/10/15 2010 06/11/15 0054 06/11/15 0543 06/12/15 0608  NA 139 140 142 140 140  K 3.9 3.8 4.7 4.3 4.3  CL 95* 97* 99* 101 102  CO2 30 32 31 31 27   GLUCOSE 180* 123* 184* 272* 304*  BUN 11 19 23* 22* 15  CREATININE 0.59 0.54 0.59 0.52 0.51  CALCIUM 9.5 9.3 9.2 8.6* 8.8*  AST 26  --   --   --  16  ALT  22  --   --   --  17  ALKPHOS 74  --   --   --  64  BILITOT 0.4  --   --   --  0.4   ------------------------------------------------------------------------------------------------------------------ estimated creatinine clearance is 50.6 mL/min (by C-G  formula based on Cr of 0.51). ------------------------------------------------------------------------------------------------------------------ No results for input(s): HGBA1C in the last 72 hours. ------------------------------------------------------------------------------------------------------------------ No results for input(s): CHOL, HDL, LDLCALC, TRIG, CHOLHDL, LDLDIRECT in the last 72 hours. ------------------------------------------------------------------------------------------------------------------ No results for input(s): TSH, T4TOTAL, T3FREE, THYROIDAB in the last 72 hours.  Invalid input(s): FREET3 ------------------------------------------------------------------------------------------------------------------ No results for input(s): VITAMINB12, FOLATE, FERRITIN, TIBC, IRON, RETICCTPCT in the last 72 hours.  Coagulation profile No results for input(s): INR, PROTIME in the last 168 hours.  No results for input(s): DDIMER in the last 72 hours.  Cardiac Enzymes  Recent Labs Lab 06/10/15 0504  TROPONINI <0.03   ------------------------------------------------------------------------------------------------------------------ Invalid input(s): POCBNP   CBG:  Recent Labs Lab 06/11/15 1152 06/11/15 1726 06/11/15 2308 06/12/15 0811 06/12/15 1240  GLUCAP 238* 406* 323* 283* 332*       Studies: Dg Chest 2 View  06/11/2015  CLINICAL DATA:  Progressive shortness of breath EXAM: CHEST  2 VIEW COMPARISON:  Jun 10, 2015 FINDINGS: Lungs are somewhat hyperexpanded. There is no demonstrable edema or consolidation. Heart is upper normal in size with pulmonary vascularity within normal limits. There is a stent in the  left anterior descending coronary artery region. No adenopathy. IMPRESSION: No edema or consolidation.  Stable cardiac silhouette. Electronically Signed   By: Lowella Grip III M.D.   On: 06/11/2015 09:10      Lab Results  Component Value Date   HGBA1C 7.2* 01/30/2015   HGBA1C 8.3 11/18/2014   HGBA1C 7.4* 06/18/2014   Lab Results  Component Value Date   LDLCALC 89 08/08/2012   CREATININE 0.51 06/12/2015       Scheduled Meds: . antiseptic oral rinse  7 mL Mouth Rinse BID  . azithromycin  500 mg Oral Daily  . diltiazem  240 mg Oral Daily  . enoxaparin (LOVENOX) injection  40 mg Subcutaneous Q24H  . feeding supplement (GLUCERNA SHAKE)  237 mL Oral TID BM  . insulin aspart  0-9 Units Subcutaneous TID WC  . insulin glargine  25 Units Subcutaneous QHS  . ipratropium-albuterol  3 mL Nebulization Q4H  . methylPREDNISolone (SOLU-MEDROL) injection  40 mg Intravenous TID  . pantoprazole  40 mg Oral QAC breakfast  . potassium chloride  40 mEq Oral UD  . [START ON 06/13/2015] predniSONE  40 mg Oral QAC breakfast  . senna-docusate  2 tablet Oral QHS   Continuous Infusions: . sodium chloride 50 mL/hr at 06/11/15 2012     LOS: 2 days    Time spent: >30 MINS    Verneita Griffes, MD [facility] (P) [phone]

## 2015-06-12 NOTE — Progress Notes (Signed)
[facility] [place] [facility] w/e SW visit:   Related admission to hospice diagnosis COPD. Code status is DNR. Met with patient and son at bedside to process information shared and recommendations made by Dr. Verlon Au this morning. Patient is aware her medications have been adjusted and is pleased with changes. Spoke with RN Lonnie to confirm changes. Patient expressed desire to transfer to [facility] for end of life care if she is eligible and if room is available. [place] is aware of request and a hospital liaison will follow up with patient and family tomorrow. Spoke with Dr. Verlon Au and CSW Denyse Amass regarding this plan and plan for follow up tomorrow.   Please contact [facility] with hospice needs at [phone].  Thank you. Erling Conte, [place]

## 2015-06-13 LAB — COMPREHENSIVE METABOLIC PANEL
ALK PHOS: 76 U/L (ref 38–126)
ALT: 22 U/L (ref 14–54)
AST: 19 U/L (ref 15–41)
Albumin: 3.5 g/dL (ref 3.5–5.0)
Anion gap: 11 (ref 5–15)
BUN: 20 mg/dL (ref 6–20)
CALCIUM: 9 mg/dL (ref 8.9–10.3)
CO2: 28 mmol/L (ref 22–32)
CREATININE: 0.53 mg/dL (ref 0.44–1.00)
Chloride: 98 mmol/L — ABNORMAL LOW (ref 101–111)
GFR calc non Af Amer: >60 mL/min (ref >60)
Glucose, Bld: 283 mg/dL — ABNORMAL HIGH (ref 65–99)
Potassium: 4.6 mmol/L (ref 3.5–5.1)
SODIUM: 137 mmol/L (ref 135–145)
Total Bilirubin: 0.4 mg/dL (ref 0.3–1.2)
Total Protein: 5.9 g/dL — ABNORMAL LOW (ref 6.5–8.1)

## 2015-06-13 LAB — CBC WITH DIFFERENTIAL/PLATELET
Basophils Absolute: 0 10*3/uL (ref 0.0–0.1)
Basophils Relative: 0 %
EOS ABS: 0 10*3/uL (ref 0.0–0.7)
Eosinophils Relative: 0 %
HCT: 35.8 % — ABNORMAL LOW (ref 36.0–46.0)
HEMOGLOBIN: 10.9 g/dL — AB (ref 12.0–15.0)
LYMPHS ABS: 0.4 10*3/uL — AB (ref 0.7–4.0)
LYMPHS PCT: 3 %
MCH: 27.7 pg (ref 26.0–34.0)
MCHC: 30.4 g/dL (ref 30.0–36.0)
MCV: 90.9 fL (ref 78.0–100.0)
Monocytes Absolute: 0.6 10*3/uL (ref 0.1–1.0)
Monocytes Relative: 4 %
NEUTROS PCT: 93 %
Neutro Abs: 15.9 10*3/uL — ABNORMAL HIGH (ref 1.7–7.7)
Platelets: 340 10*3/uL (ref 150–400)
RBC: 3.94 MIL/uL (ref 3.87–5.11)
RDW: 13.9 % (ref 11.5–15.5)
WBC: 16.9 10*3/uL — AB (ref 4.0–10.5)

## 2015-06-13 LAB — GLUCOSE, CAPILLARY
GLUCOSE-CAPILLARY: 169 mg/dL — AB (ref 65–99)
Glucose-Capillary: 133 mg/dL — ABNORMAL HIGH (ref 65–99)
Glucose-Capillary: 273 mg/dL — ABNORMAL HIGH (ref 65–99)

## 2015-06-13 NOTE — Progress Notes (Signed)
PT Cancellation Note  Patient Details Name: Judith Boyer MRN: [id number] DOB: 02-Feb-1946   Cancelled Treatment:    Reason Eval/Treat Not Completed: Patient declined, no reason specified Pt declined due to nausea and feeling "very sick". PT will check on pt later as time allows.    [place] April, PTA Pager: [phone]   06/13/2015, 10:27 AM

## 2015-06-13 NOTE — Progress Notes (Addendum)
Patient being discharged to [facility] under Hospice care report called to [place] awaiting transportation. Family is aware of plan ,patient has no c/o pain at this time. Discharge instructions faxed to [facility] by MSW.  Leanette Eutsler, Tivis Ringer, RN

## 2015-06-13 NOTE — Progress Notes (Signed)
Patient transported to [facility] by [facility], left floor via stretcher report given to [facility] staff before transport.  Rudolfo Brandow [employer]

## 2015-06-13 NOTE — Progress Notes (Signed)
Patient will discharge to [facility] Anticipated discharge date: 5/8 Family notified: hospice CSW to inform pt son Transportation by [facility]- scheduled for 11:30am Report #: [phone]  CSW signing off.  Domenica Reamer, [place] Social Worker [phone]

## 2015-06-13 NOTE — Progress Notes (Signed)
[facility] [place] Work note Patient has a bed at [name] for today and agrees to go. She was lying in bed, reporting shortness of breath and not feeling well. Patient was anxious about transfer and stated worry that she needed help in packing her belongings to go. LCSW offered reassurance and encouraged breathing exercises. LCSW informed patient of plan for today and encouraged no worry about process. Staff are aware and will help patient with transfer as family are not in room at present. Judith Boyer, [place]

## 2015-06-13 NOTE — Progress Notes (Signed)
5W 14-[facility] [place] RN Visit  This is a related, covered GIP admission from 06/10/15 to [facility] diagnosis of COPD.  Patient is a DNR.  Patient was admitted with COPD exacerbation.  Patient seen in room resting.  No family present at time of visit.  Patient lethargic, but easily arouseable.  She confirmed she does want [facility] for EOL care and symptom management.  She is very short of breath in conversation and does not want to speak anymore at this time.  She is on 4L [place] with O2 sats at 98% and respirations at 22. She has received 2 doses of 1 mg Ativan for anxiety and 8 doses of 2 mg Roxanol po for shortness of breath over the past 24 hours.  She is also receiving Duoneb nebulizer treatments Q 4 hours.  Spoke to [employer], [employer] Education officer, museum, who is coming to visit patient this morning and further speak about arrangements to [name].  RN please call report to [facility] at [phone].  Please call with any hospice-related questions or concerns.  Thank You, Freddi Starr RN, [facility] Liaison [phone]

## 2015-06-13 NOTE — Discharge Summary (Signed)
Physician Discharge Summary  Judith Boyer [id number] DOB: [birth year] DOA: 06/10/2015  PCP: Jenny Reichmann, MD  Admit date: 06/10/2015 Discharge date: 06/13/2015  Time spent: < 30 minutes  Recommendations for Outpatient Follow-up:  1. Discharge to [facility]   Discharge Diagnoses:  Active Problems:   Hypertension   Diabetes mellitus type 2, controlled, without complications (HCC)   COPD exacerbation ([place])   Acute respiratory distress (HCC)   Acute respiratory failure (HCC)   CAP (community acquired pneumonia)   CAD (coronary artery disease)   Chronic constipation   Goals of care, counseling/discussion   Discharge Condition: critical  Diet recommendation: as tolerated  Filed Weights   06/10/15 1830 06/11/15 1625  Weight: 47.945 kg (105 lb 11.2 oz) 51.937 kg (114 lb 8 oz)    History of present illness:  See H&P, Labs, Consult and Test reports for all details in brief, patient is a 70 y.o. female with history of HTN, DM, CAD / MI and COPD on 3L at home, followed by hospice at home, she presents with worsening dyspnea, most likely related to COPD exacerbation  Hospital Course Acute respiratory distress secondary to CAP and COPD exacerbation - Compensated respiratory alkalosis on ABGs, Did not require BiPAP, transfer to telemetry. Given end stage COPD and home hospice, palliative was consulted, and after discussions with patient, elected to be discharged to residential hospice, [facility] place. Continue supportive management and symptom control Diabetes mellitus type 2, controlled, without complications -CBG, SSI (moderate scale for high dose steroids) Hypertension - continue home Cardizem 240 daily Chronic constipation - continue home stool softener Hyperlipidemia - continue home statin CAD / MI / stents - no chest pain  Procedures:  None    Consultations:  Palliative care  Discharge Exam: Filed Vitals:   06/12/15 1619 06/12/15 2020 06/12/15 2136 06/13/15  0759  BP:   141/63 149/65  Pulse:  90 92 97  Temp:   99 F (37.2 C) 98.4 F (36.9 C)  TempSrc:   Oral Oral  Resp:  22 18 18   Height:      Weight:      SpO2: 99% 98% 98% 100%    General: tachypneic Cardiovascular: RRR Respiratory: decreased breath sounds overall, no wheezing  Discharge Instructions Activity:  As tolerated   Get Medicines reviewed and adjusted: Please take all your medications with you for your next visit with your Primary MD  Please request your Primary MD to go over all hospital tests and procedure/radiological results at the follow up, please ask your Primary MD to get all Hospital records sent to his/her office.  If you experience worsening of your admission symptoms, develop shortness of breath, life threatening emergency, suicidal or homicidal thoughts you must seek medical attention immediately by calling 911 or calling your MD immediately if symptoms less severe.  You must read complete instructions/literature along with all the possible adverse reactions/side effects for all the Medicines you take and that have been prescribed to you. Take any new Medicines after you have completely understood and accpet all the possible adverse reactions/side effects.   Do not drive when taking Pain medications.   Do not take more than prescribed Pain, Sleep and Anxiety Medications  Special Instructions: If you have smoked or chewed Tobacco in the last 2 yrs please stop smoking, stop any regular Alcohol and or any Recreational drug use.  Wear Seat belts while driving.  Please note  You were cared for by a hospitalist during your hospital stay. Once  you are discharged, your primary care physician will handle any further medical issues. Please note that NO REFILLS for any discharge medications will be authorized once you are discharged, as it is imperative that you return to your primary care physician (or establish a relationship with a primary care physician if you  do not have one) for your aftercare needs so that they can reassess your need for medications and monitor your lab values.    Medication List    STOP taking these medications        azithromycin 500 MG tablet  Commonly known as:  ZITHROMAX      TAKE these medications        acetaminophen 325 MG tablet  Commonly known as:  TYLENOL  Take 2 tablets (650 mg total) by mouth every 6 (six) hours as needed for mild pain (or Fever >/= 101).     arformoterol 15 MCG/2ML Nebu  Commonly known as:  BROVANA  Take 2 mLs (15 mcg total) by nebulization 2 (two) times daily.     atorvastatin 10 MG tablet  Commonly known as:  LIPITOR  Take 10 mg by mouth daily.     bisacodyl 10 MG suppository  Commonly known as:  DULCOLAX  Place 1 suppository (10 mg total) rectally daily as needed for moderate constipation.     budesonide 0.5 MG/2ML nebulizer solution  Commonly known as:  PULMICORT  Take 0.5 mg by nebulization 2 (two) times daily.     diltiazem 180 MG 24 hr capsule  Commonly known as:  CARDIZEM CD  Take 1 capsule (180 mg total) by mouth daily.     diltiazem 240 MG 24 hr capsule  Commonly known as:  DILACOR XR  Take 1 capsule (240 mg total) by mouth daily.     feeding supplement (GLUCERNA SHAKE) Liqd  Take 237 mLs by mouth 3 (three) times daily between meals.     furosemide 20 MG tablet  Commonly known as:  LASIX  Take 20 mg by mouth daily. 06-09-15-06-14-15     haloperidol 5 MG tablet  Commonly known as:  HALDOL  Take 5 mg by mouth every 4 (four) hours as needed for agitation (for anxiety or nausea). Reported on 04/03/2015     insulin aspart 100 UNIT/ML injection  Commonly known as:  NOVOLOG  Before each meal 3 times a day, 140-199 - 2 units, 200-250 - 4 units, 251-299 - 6 units,  300-349 - 8 units,  350 or above 10 units. Dispense syringes and needles as needed, Ok to switch to PEN if approved. Substitute to any brand approved. DX DM2, Code E11.65     ipratropium-albuterol 0.5-2.5 (3)  MG/3ML Soln  Commonly known as:  DUONEB  Take 3 mLs by nebulization 4 (four) times daily - after meals and at bedtime.     LANTUS 100 UNIT/ML injection  Generic drug:  insulin glargine  INJECT 25 UNITS INTO SKIN EVERY MORNING     LORazepam 0.5 MG tablet  Commonly known as:  ATIVAN  Take 1-2 tablets (0.5-1 mg total) by mouth every 8 (eight) hours as needed for anxiety.     Melatonin 3 MG Tabs  Take 3 mg by mouth at bedtime. Reported on 04/03/2015     montelukast 10 MG tablet  Commonly known as:  SINGULAIR  take 1 tablet by mouth once daily     morphine 20 MG/ML concentrated solution  Commonly known as:  ROXANOL  Take 0.25-0.5 mg by mouth every  4 (four) hours as needed for severe pain (pain or shortness of breath).     MUCINEX MAXIMUM STRENGTH 1200 MG Tb12  Generic drug:  Guaifenesin  Take 1,200 mg by mouth 2 (two) times daily.     nitroGLYCERIN 0.4 MG SL tablet  Commonly known as:  NITROSTAT  Place 1 tablet (0.4 mg total) under the tongue every 5 (five) minutes x 3 doses as needed for chest pain.     oxyCODONE 5 MG immediate release tablet  Commonly known as:  ROXICODONE  Take 1 tablet every 2-4 hours as needed for respiratory distress.     OXYGEN  Inhale 3 L into the lungs continuous.     pantoprazole 40 MG tablet  Commonly known as:  PROTONIX  Take 40 mg by mouth daily. Reported on 04/03/2015     polyethylene glycol packet  Commonly known as:  MIRALAX / GLYCOLAX  Take 17 g by mouth daily as needed (constipation). Reported on 04/03/2015     predniSONE 10 MG tablet  Commonly known as:  DELTASONE  Take 2 tablets (20 mg total) by mouth every morning.     PROAIR HFA 108 (90 Base) MCG/ACT inhaler  Generic drug:  albuterol  Inhale 2 puffs into the lungs every 4 (four) hours as needed for wheezing or shortness of breath.     prochlorperazine 10 MG tablet  Commonly known as:  COMPAZINE  Take 10 mg by mouth every 6 (six) hours as needed for nausea or vomiting.      QUEtiapine 25 MG tablet  Commonly known as:  SEROQUEL  Take 25 mg by mouth 2 (two) times daily. Reported on 04/03/2015     senna-docusate 8.6-50 MG tablet  Commonly known as:  Senokot-S  Take 2 tablets by mouth at bedtime.     sodium chloride 0.65 % Soln nasal spray  Commonly known as:  OCEAN  Place 1 spray into both nostrils as needed for congestion.         The results of significant diagnostics from this hospitalization (including imaging, microbiology, ancillary and laboratory) are listed below for reference.    Significant Diagnostic Studies: Dg Chest 2 View  06/11/2015  CLINICAL DATA:  Progressive shortness of breath EXAM: CHEST  2 VIEW COMPARISON:  Jun 10, 2015 FINDINGS: Lungs are somewhat hyperexpanded. There is no demonstrable edema or consolidation. Heart is upper normal in size with pulmonary vascularity within normal limits. There is a stent in the left anterior descending coronary artery region. No adenopathy. IMPRESSION: No edema or consolidation.  Stable cardiac silhouette. Electronically Signed   By: Lowella Grip III M.D.   On: 06/11/2015 09:10   Dg Chest Port 1 View  06/10/2015  CLINICAL DATA:  Shortness of breath. EXAM: PORTABLE CHEST 1 VIEW COMPARISON:  03/20/2015 FINDINGS: Cardiac enlargement without vascular congestion. Emphysematous changes in the lungs. Suggestion of mild infiltration in the right middle lung possibly indicating pneumonia or atelectasis. Prominent cardiac fat pad at the left apex. Calcification of the aorta. No pneumothorax. No blunting of costophrenic angles. IMPRESSION: Infiltration or atelectasis in the right middle lung. Emphysematous changes in the lungs. Electronically Signed   By: Lucienne Capers M.D.   On: 06/10/2015 05:17    Microbiology: Recent Results (from the past 240 hour(s))  Culture, blood (routine x 2) Call MD if unable to obtain prior to antibiotics being given     Status: None (Preliminary result)   Collection Time: 06/10/15  10:15 AM  Result Value Ref Range  Status   Specimen Description BLOOD LEFT HAND  Final   Special Requests BOTTLES DRAWN AEROBIC AND ANAEROBIC [place]  Final   Culture NO GROWTH 2 DAYS  Final   Report Status PENDING  Incomplete  Culture, blood (routine x 2) Call MD if unable to obtain prior to antibiotics being given     Status: None (Preliminary result)   Collection Time: 06/10/15 10:20 AM  Result Value Ref Range Status   Specimen Description BLOOD RIGHT ANTECUBITAL  Final   Special Requests BOTTLES DRAWN AEROBIC AND ANAEROBIC [place]  Final   Culture NO GROWTH 2 DAYS  Final   Report Status PENDING  Incomplete  MRSA PCR Screening     Status: None   Collection Time: 06/10/15  7:31 PM  Result Value Ref Range Status   MRSA by PCR NEGATIVE NEGATIVE Final    Comment:        The GeneXpert MRSA Assay (FDA approved for NASAL specimens only), is one component of a comprehensive MRSA colonization surveillance program. It is not intended to diagnose MRSA infection nor to guide or monitor treatment for MRSA infections.      Labs: Basic Metabolic Panel:  Recent Labs Lab 06/10/15 0504 06/10/15 2010 06/11/15 0054 06/11/15 0543 06/12/15 0608 06/13/15 0618  NA 139 140 142 140 140 137  K 3.9 3.8 4.7 4.3 4.3 4.6  CL 95* 97* 99* 101 102 98*  CO2 30 32 31 31 27 28   GLUCOSE 180* 123* 184* 272* 304* 283*  BUN 11 19 23* 22* 15 20  CREATININE 0.59 0.54 0.59 0.52 0.51 0.53  CALCIUM 9.5 9.3 9.2 8.6* 8.8* 9.0   Liver Function Tests:  Recent Labs Lab 06/10/15 0504 06/12/15 0608 06/13/15 0618  AST 26 16 19   ALT 22 17 22   ALKPHOS 74 64 76  BILITOT 0.4 0.4 0.4  PROT 6.3* 5.2* 5.9*  ALBUMIN 3.6 2.9* 3.5   CBC:  Recent Labs Lab 06/10/15 0504 06/11/15 0543 06/12/15 0608 06/13/15 0618  WBC 15.8* 17.9* 14.3* 16.9*  NEUTROABS 12.2*  --   --  15.9*  HGB 11.2* 9.9* 9.9* 10.9*  HCT 36.3 32.5* 32.4* 35.8*  MCV 90.3 90.0 90.3 90.9  PLT 368 311 291 340   Cardiac Enzymes:  Recent Labs Lab  06/10/15 0504  TROPONINI <0.03   BNP: BNP (last 3 results)  Recent Labs  08/13/14 1320 12/25/14 1210 06/10/15 0504  BNP 12.0 45.6 29.3   CBG:  Recent Labs Lab 06/12/15 0811 06/12/15 1240 06/12/15 1720 06/12/15 2022 06/13/15 0736  GLUCAP 283* 332* 271* 261* 273*     Signed:  Marzetta Board  [facility] 06/13/2015, 10:16 AM

## 2015-06-15 LAB — CULTURE, BLOOD (ROUTINE X 2)
CULTURE: NO GROWTH
CULTURE: NO GROWTH

## 2015-06-16 DIAGNOSIS — J449 Chronic obstructive pulmonary disease, unspecified: Secondary | ICD-10-CM | POA: Diagnosis not present

## 2015-07-05 ENCOUNTER — Telehealth: Payer: Self-pay

## 2015-07-05 NOTE — Telephone Encounter (Signed)
Pt's hospice nurse called again.  Pt is completely out of novalog and her sugar is running in the 300s.  Please call in asap.  Wants to use the [facility] on [address].  Nurse # [phone]

## 2015-07-05 NOTE — Telephone Encounter (Signed)
Pt is needing her insulin called in she is completely out

## 2015-07-06 ENCOUNTER — Other Ambulatory Visit: Payer: Self-pay | Admitting: Emergency Medicine

## 2015-07-06 MED ORDER — INSULIN ASPART 100 UNIT/ML ~~LOC~~ SOLN: SUBCUTANEOUS | Status: AC

## 2015-07-06 MED ORDER — INSULIN GLARGINE 100 UNIT/ML ~~LOC~~ SOLN: SUBCUTANEOUS | Status: AC

## 2015-07-06 NOTE — Telephone Encounter (Signed)
Telephone call to patient's nurse.  LMVM advising her that patient's insulin has been called into pharmacy as requested.

## 2015-07-06 NOTE — Telephone Encounter (Signed)
I have refilled both insulins. Please contact the nurse.

## 2015-07-06 NOTE — Telephone Encounter (Signed)
Dr Everlene Farrier...hospice nurse has called stating that Judith Boyer's sugar is running in the 300+ range.  She is out of insulin.  Uses [facility].  Nurses' phone number is [phone].  Thank you.

## 2015-07-07 ENCOUNTER — Telehealth: Payer: Self-pay

## 2015-07-07 NOTE — Telephone Encounter (Signed)
Renee from Hospice is calling because the patient was discharged today and was prescribed Lorazepam. Joseph Art states the patient will be out by the weekend. Patient will need a refill. She takes 6 tablets a day and will be running out soon. [facility] [address]

## 2015-07-08 ENCOUNTER — Other Ambulatory Visit: Payer: Self-pay

## 2015-07-08 MED ORDER — LORAZEPAM 0.5 MG PO TABS: ORAL_TABLET | ORAL | Status: DC

## 2015-07-08 NOTE — Telephone Encounter (Signed)
Rickey Barbara hospice nurse called to get patient lorazapam refilled-she states patient is very anxious that she will run out this weekend, and the nurse would like a clarification on the medication because it states patient can take up to 6 a day and the nurse feels she needs them but she only gets a week supply and she doesn't take them like she should so she doesn't run out   Best number to call renee is [phone]

## 2015-07-08 NOTE — Telephone Encounter (Signed)
See duplicate message from 6/2 for notes.

## 2015-07-08 NOTE — Telephone Encounter (Signed)
Pended as orig Rxd, but Dr Everlene Farrier, please review note below and see if sig and quantity need to change. Thank you!

## 2015-07-09 ENCOUNTER — Telehealth: Payer: Self-pay | Admitting: *Deleted

## 2015-07-09 ENCOUNTER — Telehealth: Payer: Self-pay

## 2015-07-09 DIAGNOSIS — J441 Chronic obstructive pulmonary disease with (acute) exacerbation: Secondary | ICD-10-CM

## 2015-07-09 MED ORDER — OXYCODONE HCL 5 MG PO TABS: ORAL_TABLET | ORAL | Status: DC

## 2015-07-09 NOTE — Telephone Encounter (Signed)
Carlos from [facility] is calling to speak with Dr. Everlene Farrier about patient's COPD and to request a refill for oxycodone. Clifton James states she really needs to speak to Dr. Everlene Farrier. Please call! [phone]

## 2015-07-09 NOTE — Telephone Encounter (Signed)
Hospice nurse Clifton James called and stated that the Hospice Dr called in oxycodone 5mg  1 tab q2-3hrs prn  #100 on 07/05/15 and that she has taken all but 7 tabs.  She would like to talk to you about what to do with pt.  [phone]

## 2015-07-09 NOTE — Telephone Encounter (Signed)
I called and spoke with the hospice nurse. The hospice nurse will start checking oxycodone supplies every 48 hours. She was given enough medication for 4 days today. We will need to keep close watch on her oxycodone prescriptions.

## 2015-07-11 ENCOUNTER — Telehealth: Payer: Self-pay

## 2015-07-11 NOTE — Telephone Encounter (Signed)
Pt is needing to talk with dr Everlene Farrier only about the date on her medications  Best number [phone]

## 2015-07-11 NOTE — Telephone Encounter (Signed)
I have already spoken with Judith Boyer and reviewed the situation with her.

## 2015-07-13 ENCOUNTER — Telehealth: Payer: Self-pay | Admitting: Emergency Medicine

## 2015-07-13 ENCOUNTER — Other Ambulatory Visit: Payer: Self-pay | Admitting: Emergency Medicine

## 2015-07-13 DIAGNOSIS — J441 Chronic obstructive pulmonary disease with (acute) exacerbation: Secondary | ICD-10-CM

## 2015-07-13 DIAGNOSIS — F411 Generalized anxiety disorder: Secondary | ICD-10-CM

## 2015-07-13 MED ORDER — LORAZEPAM 0.5 MG PO TABS: ORAL_TABLET | ORAL | Status: DC

## 2015-07-13 MED ORDER — OXYCODONE HCL 5 MG PO TABS: ORAL_TABLET | ORAL | Status: DC

## 2015-07-13 NOTE — Telephone Encounter (Signed)
Hospice nurse Renay called in requesting refills on Oxycodone and Lorazepam  States, pt is taking 12 tablets of Oxycodone and 6 tabs Lorazepam per day and will run out Friday. Pt is to follow up with Dr. Mitzi Davenport on Friday for re-certification and future plan of care and treatment Will need refill until seen by doctor Message routed to Dr. Everlene Farrier as urgent

## 2015-07-18 ENCOUNTER — Other Ambulatory Visit: Payer: Self-pay | Admitting: Emergency Medicine

## 2015-07-18 ENCOUNTER — Telehealth: Payer: Self-pay

## 2015-07-18 DIAGNOSIS — J441 Chronic obstructive pulmonary disease with (acute) exacerbation: Secondary | ICD-10-CM

## 2015-07-18 MED ORDER — OXYCODONE HCL 5 MG PO TABS: ORAL_TABLET | ORAL | Status: DC

## 2015-07-18 NOTE — Telephone Encounter (Signed)
Son is calling for a refill on her lorazapam and oxycodone and would like for [place] to fax this in to [facility] [address] before 3:00 this is the only time he has a ride   Best number [phone]

## 2015-07-18 NOTE — Telephone Encounter (Signed)
I called down to the TL desk to see if this had been faxed in (can be faxed since Hospice pt). Jocelyn checked and reported that the TL had told the son that she would fax it in.

## 2015-07-21 ENCOUNTER — Telehealth: Payer: Self-pay

## 2015-07-21 NOTE — Telephone Encounter (Signed)
Patient's son is calling to see if the prescription is ready. He states we prescribe 4 days at a time and he has to have the medication at 3.

## 2015-07-21 NOTE — Telephone Encounter (Signed)
Hospice is calling to request a refill on pt oxycodone and states they were supposed to meet with hospice dr today to discuss taking over care of this but he is unavailable to next week and the son only has a ride [facility] [address] [facility] [address] and would like it called in as soon as possible

## 2015-07-22 NOTE — Telephone Encounter (Signed)
Spoke with pt's nurse this morning and she informed me that she called her hospice doctor to write the medication for the pt. I spoke with Dr. Everlene Farrier and she will not need a prescription until Monday morning. I advised her nurse to call me when she wants to pick it up.

## 2015-08-01 ENCOUNTER — Telehealth: Payer: Self-pay

## 2015-08-01 ENCOUNTER — Other Ambulatory Visit: Payer: Self-pay

## 2015-08-01 ENCOUNTER — Other Ambulatory Visit: Payer: Self-pay | Admitting: Emergency Medicine

## 2015-08-01 DIAGNOSIS — J441 Chronic obstructive pulmonary disease with (acute) exacerbation: Secondary | ICD-10-CM

## 2015-08-01 DIAGNOSIS — F411 Generalized anxiety disorder: Secondary | ICD-10-CM

## 2015-08-01 MED ORDER — OXYCODONE HCL 5 MG PO TABS: ORAL_TABLET | ORAL | Status: DC

## 2015-08-01 NOTE — Telephone Encounter (Signed)
Spoke to [employer] son.  He states that the hospice doctor did write the RX for oxycodone two weeks ago for 100 pills, but he wants Dr. Everlene Farrier to continue writing them normally.  The hospice doctor stated that he will write them when Dr. Everlene Farrier is not in the office.  I asked per Dr. Everlene Farrier, when would be a good time for Dr. Everlene Farrier to come visit Judith Boyer.  Son states today would be the best day this week.  I told him if Dr. Everlene Farrier can't come, I will call them back.

## 2015-08-01 NOTE — Telephone Encounter (Signed)
Call from [facility]' hospice nurse.  She states that hospice doctor increase patient's dosage on lorezpam to 1 mg, 1 tablet every 4 hrs.  Patient needs refill now.

## 2015-08-01 NOTE — Telephone Encounter (Signed)
Spoke with Dr.Daub regarding the call from hospice nurse.  He said to advise them to give patient two 0.5mg  tabs every four hours until refills are depleted and then he will refill medication.  Spoke to patient's son and advised her of Dr. Perfecto Kingdom instructions.  He verbalized understanding.

## 2015-08-02 ENCOUNTER — Other Ambulatory Visit: Payer: Self-pay | Admitting: Emergency Medicine

## 2015-08-02 MED ORDER — BUDESONIDE 0.5 MG/2ML IN SUSP: 0.5000 mg | Freq: Two times a day (BID) | RESPIRATORY_TRACT | Status: AC

## 2015-08-02 MED ORDER — IPRATROPIUM-ALBUTEROL 0.5-2.5 (3) MG/3ML IN SOLN: 3.0000 mL | Freq: Three times a day (TID) | RESPIRATORY_TRACT | Status: DC

## 2015-08-02 NOTE — Telephone Encounter (Signed)
Pt called req. Refill  ipratropium-albuterol (DUONEB) 0.5-2.5 (3) MG/3ML SOLN [id number] and budesonide (PULMICORT) 0.5 MG/2ML nebulizer solution [id number]   Pharmacy:  [facility], [place]

## 2015-08-04 MED ORDER — LORAZEPAM 1 MG PO TABS: ORAL_TABLET | ORAL | Status: AC

## 2015-08-04 NOTE — Telephone Encounter (Signed)
Pended as req'd by hospice nurse.

## 2015-08-04 NOTE — Telephone Encounter (Signed)
Dr Everlene Farrier sent in these RFs. Please advise son.

## 2015-08-05 ENCOUNTER — Other Ambulatory Visit: Payer: Self-pay | Admitting: Emergency Medicine

## 2015-08-05 ENCOUNTER — Telehealth: Payer: Self-pay

## 2015-08-05 ENCOUNTER — Other Ambulatory Visit: Payer: Self-pay

## 2015-08-05 DIAGNOSIS — J441 Chronic obstructive pulmonary disease with (acute) exacerbation: Secondary | ICD-10-CM

## 2015-08-05 MED ORDER — OXYCODONE HCL 5 MG PO TABS: ORAL_TABLET | ORAL | Status: DC

## 2015-08-05 MED ORDER — ALBUTEROL SULFATE HFA 108 (90 BASE) MCG/ACT IN AERS: 2.0000 | INHALATION_SPRAY | RESPIRATORY_TRACT | Status: DC | PRN

## 2015-08-05 MED ORDER — ALBUTEROL SULFATE (2.5 MG/3ML) 0.083% IN NEBU: 2.5000 mg | INHALATION_SOLUTION | RESPIRATORY_TRACT | Status: DC | PRN

## 2015-08-05 NOTE — Telephone Encounter (Signed)
Hospice is calling because patient needs a refill for oxycodone.  Patient phone: [phone]

## 2015-08-05 NOTE — Telephone Encounter (Signed)
Hospice nurse, Lucita Ferrara, [facility] also asking for RFs of albuterol inhaler and plain albuterol neb solution (not Duoneb). I will pend all for review. I pended albuterol sol as in EPIC w/quantity requested by nurse. We will need to fax the Oxycodone Rx to pharm w/note that she is a hospice pt.

## 2015-08-05 NOTE — Telephone Encounter (Signed)
Pt's son called concerning mom's medication oxyCODONE (ROXICODONE) 5 MG immediate release tablet [id number] the pharmacy will not refill the medication because it's too early. The pharmacist states that if Dr.Daub called and gave the verbal ok, he would override it and fill the medication.

## 2015-08-05 NOTE — Telephone Encounter (Signed)
Called pharm and they did receive the Rx, but pt has not p/up yet. There is a request for other meds today, and I will advise pt of this RF when the others have been sent so that they can p/up together.

## 2015-08-05 NOTE — Telephone Encounter (Signed)
Patient states that she's been waiting on her medication since Monday and states that this is ridiculous. Patient requested for Dr. Everlene Farrier to call her.

## 2015-08-07 NOTE — Telephone Encounter (Signed)
This is duplicate. All of pt's meds were sent and Dr Everlene Farrier called son to notify.

## 2015-08-08 NOTE — Telephone Encounter (Signed)
Pt's son was notified by Dr Everlene Farrier Friday.

## 2015-08-08 NOTE — Telephone Encounter (Signed)
Son was notified by Dr Everlene Farrier on Fri.

## 2015-08-09 NOTE — Telephone Encounter (Signed)
I called and left a message on the answering machine. At the pharmacy. I did give the okay to refill the prescription early.

## 2015-08-15 ENCOUNTER — Other Ambulatory Visit: Payer: Self-pay | Admitting: Emergency Medicine

## 2015-08-15 ENCOUNTER — Telehealth: Payer: Self-pay

## 2015-08-15 DIAGNOSIS — J441 Chronic obstructive pulmonary disease with (acute) exacerbation: Secondary | ICD-10-CM

## 2015-08-15 MED ORDER — OXYCODONE HCL 5 MG PO TABS: ORAL_TABLET | ORAL | Status: DC

## 2015-08-15 NOTE — Telephone Encounter (Signed)
Hospice nurse -renee Tamala Julian [phone] pt needs novalog and oxycodone   [facility] [address]

## 2015-08-16 NOTE — Telephone Encounter (Signed)
I checked with [name] and she stated that she faxed this to pharm yesterday and they are aware. Pt should have many RFs of novolog at pharm.

## 2015-08-29 ENCOUNTER — Telehealth: Payer: Self-pay

## 2015-08-29 ENCOUNTER — Other Ambulatory Visit: Payer: Self-pay | Admitting: Emergency Medicine

## 2015-08-29 DIAGNOSIS — J441 Chronic obstructive pulmonary disease with (acute) exacerbation: Secondary | ICD-10-CM

## 2015-08-29 MED ORDER — OXYCODONE HCL 5 MG PO TABS: ORAL_TABLET | ORAL | 0 refills | Status: DC

## 2015-08-29 NOTE — Telephone Encounter (Signed)
Caregiver called for 8 oxyCODONE (ROXICODONE) 5 MG immediate release table  [id number]

## 2015-09-05 ENCOUNTER — Telehealth: Payer: Self-pay | Admitting: Emergency Medicine

## 2015-09-05 ENCOUNTER — Other Ambulatory Visit: Payer: Self-pay | Admitting: Emergency Medicine

## 2015-09-05 DIAGNOSIS — J441 Chronic obstructive pulmonary disease with (acute) exacerbation: Secondary | ICD-10-CM

## 2015-09-05 MED ORDER — OXYCODONE HCL 5 MG PO TABS: ORAL_TABLET | ORAL | 0 refills | Status: DC

## 2015-09-05 NOTE — Telephone Encounter (Signed)
Pharm called and reported they only have #59 tablets and it would be best to send it to a different location. Spoke to pt who asked me to send it to the [facility]. Re-faxed Rx to this pharm w/ confirmation of receipt. Notified pt done.

## 2015-09-05 NOTE — Telephone Encounter (Signed)
Rickey Barbara from Hospice called on behalf of [place]. Renee stated patient need a refill of Oxycodone 5 MG. Please fax prescription to [facility] on [address]. Please call Rickey Barbara if you have any questions on concerns.  [phone]

## 2015-09-12 ENCOUNTER — Other Ambulatory Visit: Payer: Self-pay | Admitting: Emergency Medicine

## 2015-09-12 ENCOUNTER — Telehealth: Payer: Self-pay | Admitting: *Deleted

## 2015-09-12 DIAGNOSIS — J441 Chronic obstructive pulmonary disease with (acute) exacerbation: Secondary | ICD-10-CM

## 2015-09-12 MED ORDER — INSULIN SYRINGE-NEEDLE U-100 30G 0.5 ML MISC: 11 refills | Status: AC

## 2015-09-12 MED ORDER — OXYCODONE HCL 5 MG PO TABS: ORAL_TABLET | ORAL | 0 refills | Status: DC

## 2015-09-12 NOTE — Telephone Encounter (Signed)
Prescriptions written. Continue Cardizem

## 2015-09-12 NOTE — Telephone Encounter (Signed)
Faxed over Rx's to pharmacy. Copies in the pending box at my desk if needed.

## 2015-09-12 NOTE — Telephone Encounter (Signed)
Renay called from Hospice.  Pt needs refill on oxycodone and syringes.   She also wanted to know if patient was suppose to be on cardizem xt 240mg .    Rx for syringes sent in for you already.  Just need you to do the 2 rxs for oxycodone.

## 2015-09-22 ENCOUNTER — Telehealth: Payer: Self-pay

## 2015-09-22 NOTE — Telephone Encounter (Signed)
THIS MESSAGE IS FROM RENEE SMITH (PRIMARY HOSPICE NURSE FOR THIS PATIENT) DR. DAUB - [place] YOU TO CALL HER SO YOU BOTH CAN DISCUSS THIS PATIENT'S CARE WHEN YOU RETIRE. SHE WOULD LIKE TO GO OVER SOME THINGS WITH YOU AND GET YOUR RECOMMENDATIONS ON WHO YOU THINK WOULD BE THE BEST DOCTOR FOR HER WHEN YOU LEAVE. SHE WOULD LIKE TO MAKE THE TRANSITION AS SMOOTH AS POSSIBLE FOR HER. BEST PHONE [phone] IS RENEE SMITH'S CELL OR YOU CAN CALL HOSPICE AT [phone] AND THEY WILL TEXT HER TO CALL YOU BACK.  [place]

## 2015-09-23 NOTE — Telephone Encounter (Signed)
Advised Judith Boyer on her voicemail.

## 2015-09-23 NOTE — Telephone Encounter (Signed)
Call Debroah Baller back and tell her to call me on my cell phone after 4:00 one day next week and we can discuss [place] case. Give her my number [phone] thank you

## 2015-10-03 ENCOUNTER — Telehealth: Payer: Self-pay

## 2015-10-03 ENCOUNTER — Other Ambulatory Visit: Payer: Self-pay | Admitting: Emergency Medicine

## 2015-10-03 DIAGNOSIS — J441 Chronic obstructive pulmonary disease with (acute) exacerbation: Secondary | ICD-10-CM

## 2015-10-03 MED ORDER — OXYCODONE HCL 5 MG PO TABS: ORAL_TABLET | ORAL | 0 refills | Status: DC

## 2015-10-03 NOTE — Telephone Encounter (Signed)
Received a phone call from patient's Hospice nurse, Renee'.  She requested that Dr. Everlene Farrier write Judith Boyer an RX for oxycodone for 2 weeks because of the Labor day holiday.  Spoke with Dr. Everlene Farrier and he said he would write RX for 200 tabs.  I reported the RX is ready and being faxed to the nurse.  She was very Patent attorney.  I further informed her that Dr. Everlene Farrier stated that Windell Hummingbird PA-C would be in charge of writing patient's RX after he retires in 4 weeks and Reginia Forts, MD would be the supervising doctor.  Joseph Art' states that hospice will not accept RX's from PA's spoke to Dr. Everlene Farrier and he stated that Dr. Tamala Julian will write the RX's.

## 2015-10-05 NOTE — Telephone Encounter (Signed)
Judith Boyer is returning a missed phone call. Please call back!  [phone]

## 2015-10-13 ENCOUNTER — Telehealth: Payer: Self-pay

## 2015-10-13 NOTE — Telephone Encounter (Signed)
THIS CALL IS FROM THE HOSPICE NURSE (RENEE SMITH) [place] DR. DAUB: SHE WOULD LIKE DR. DAUB TO CALL HER BACK REGARDING QUESTIONS THE PATIENT HAS REGARDING DR. Reginia Forts BEING HER NEW PCP. SHE WOULD LIKE TO GET DR. DAUB'S OPINION ON SOME THINGS. BEST PHONE [phone] ([place])  [place]

## 2015-10-15 ENCOUNTER — Other Ambulatory Visit: Payer: Self-pay | Admitting: Emergency Medicine

## 2015-10-15 DIAGNOSIS — J441 Chronic obstructive pulmonary disease with (acute) exacerbation: Secondary | ICD-10-CM

## 2015-10-15 MED ORDER — OXYCODONE HCL 5 MG PO TABS: ORAL_TABLET | ORAL | 0 refills | Status: DC

## 2015-10-15 NOTE — Telephone Encounter (Signed)
Msg left 

## 2015-10-17 ENCOUNTER — Other Ambulatory Visit: Payer: Self-pay

## 2015-10-17 ENCOUNTER — Telehealth: Payer: Self-pay

## 2015-10-17 DIAGNOSIS — J441 Chronic obstructive pulmonary disease with (acute) exacerbation: Secondary | ICD-10-CM

## 2015-10-17 MED ORDER — OXYCODONE HCL 5 MG PO TABS: ORAL_TABLET | ORAL | 0 refills | Status: DC

## 2015-10-17 NOTE — Telephone Encounter (Signed)
Spoke with pt son and she did get medication.

## 2015-10-17 NOTE — Telephone Encounter (Signed)
Patient needs a refill for oxycodone. Patient states that she cannot breathe without the medication because she's dying. Please advise!

## 2015-10-17 NOTE — Telephone Encounter (Signed)
THIS MESSAGE IS FROM JAN PARKER [facility]). SHE WOULD LIKE DR. DAUB TO KNOW THAT IT IS TIME FOR THE PATIENT'S OXYCODONE 5 MG. SHE GETS #200 AND TAKES 1 TABLET EVERY 2 TO 4 HOURS AS NEEDED FOR SHORTNESS OF BREATH. SHE SAID THE PATIENT NEEDS IT TODAY. THE NURSE ASKED DR. DAUB TO WRITE ON THE TOP OF THE PRESCRIPTION "HOSPICE PATIENT." Judith Boyer IS [name] [name]. IF QUESTIONS PLEASE CALL JAN PARKER R.N. AT [phone]. [name]

## 2015-10-17 NOTE — Telephone Encounter (Signed)
This was faxed in earlier by [facility].

## 2015-10-24 ENCOUNTER — Telehealth: Payer: Self-pay

## 2015-10-24 NOTE — Telephone Encounter (Signed)
Renee from Hospice called behalf of [place]. Please contact Renee at  [phone]. Renee works until 5 but you can call her after 5.  Patient is crying everyday for the last two weeks. Patient is depressed. Renee request for Dr. Everlene Farrier to give her a call as soon as possible.

## 2015-10-27 ENCOUNTER — Other Ambulatory Visit: Payer: Self-pay | Admitting: Emergency Medicine

## 2015-10-27 ENCOUNTER — Telehealth: Payer: Self-pay

## 2015-10-27 MED ORDER — ALBUTEROL SULFATE (2.5 MG/3ML) 0.083% IN NEBU: 2.5000 mg | INHALATION_SOLUTION | RESPIRATORY_TRACT | 11 refills | Status: AC | PRN

## 2015-10-27 MED ORDER — IPRATROPIUM-ALBUTEROL 0.5-2.5 (3) MG/3ML IN SOLN: 3.0000 mL | Freq: Three times a day (TID) | RESPIRATORY_TRACT | 11 refills | Status: AC

## 2015-10-27 NOTE — Telephone Encounter (Signed)
Renee notified that rxs been sent in.

## 2015-10-27 NOTE — Telephone Encounter (Signed)
Please send in the test strips. I did go ahead and refill her nebulizer treatments.

## 2015-10-27 NOTE — Telephone Encounter (Signed)
We'll go ahead and make a home visit and see if we can help with her situation.

## 2015-10-27 NOTE — Telephone Encounter (Signed)
Renee notified

## 2015-10-27 NOTE — Telephone Encounter (Signed)
THIS MESSAGE IS TO DR. DAUB FROM RENEE SMITH [facility] NURSE) PATIENT NEEDS A REFILL ON ALBUTEROL SULFATE 3 MG AND IPRATROPIUM BROMIDE 0.5 MG. SHE ALSO NEEDS ACCU CHECK AVIVA PLUS TEST STRIPS. SHE WOULD LIKE HER TO HAVE SEVERAL REFILLS. [place] DR. DAUB TO TELL HER IF SHE SHOULD MAKE AN APPOINTMENT TO COME IN TO MEET DR. Reginia Forts? BEST PHONE [phone] ([place]) [place]

## 2015-10-28 ENCOUNTER — Telehealth: Payer: Self-pay | Admitting: Emergency Medicine

## 2015-10-28 ENCOUNTER — Other Ambulatory Visit: Payer: Self-pay | Admitting: Emergency Medicine

## 2015-10-28 DIAGNOSIS — J441 Chronic obstructive pulmonary disease with (acute) exacerbation: Secondary | ICD-10-CM

## 2015-10-28 MED ORDER — GABAPENTIN 100 MG PO CAPS: ORAL_CAPSULE | ORAL | 3 refills | Status: AC

## 2015-10-28 MED ORDER — OXYCODONE HCL 5 MG PO TABS: ORAL_TABLET | ORAL | 0 refills | Status: AC

## 2015-10-28 MED ORDER — OXYCODONE HCL 5 MG PO TABS: ORAL_TABLET | ORAL | 0 refills | Status: DC

## 2015-10-28 NOTE — Telephone Encounter (Signed)
Patient having a lot of anxiety issues. She also suffers from restless leg. Add Neurontin 100 mg  One to two at night as tolerated. Prescription has been sent

## 2015-10-28 NOTE — Telephone Encounter (Signed)
Faxed oxycodone Rx, notified pt's son about this and neurontin.

## 2015-11-03 ENCOUNTER — Telehealth: Payer: Self-pay

## 2015-11-03 NOTE — Telephone Encounter (Signed)
THIS MESSAGE IS FROM [place].  SHE WANTED ME TO DOCUMENT THAT DR. DAUB O.Ked ORDERS FOR PATIENT TO HAVE ZOFRAN 4 MG TO TAKE FOR NAUSEA AND ALSO PROZAC 10 MG FOR HER DEPRESSION. BEST PHONE IF QUESTIONS: [phone] (RENEE SMITH R.N.) [place]

## 2015-11-04 NOTE — Telephone Encounter (Signed)
Renee, RN spoke with Dr. Everlene Farrier personally  Orders clarified with documentation

## 2015-11-13 ENCOUNTER — Emergency Department (HOSPITAL_COMMUNITY)

## 2015-11-13 ENCOUNTER — Encounter (HOSPITAL_COMMUNITY): Payer: Self-pay | Admitting: Emergency Medicine

## 2015-11-13 ENCOUNTER — Inpatient Hospital Stay (HOSPITAL_COMMUNITY)
Admission: EM | Admit: 2015-11-13 | Discharge: 2015-11-17 | DRG: 871 | Disposition: A | Discharge status: Hospice | Attending: Family Medicine | Admitting: Family Medicine

## 2015-11-13 DIAGNOSIS — Z7951 Long term (current) use of inhaled steroids: Secondary | ICD-10-CM

## 2015-11-13 DIAGNOSIS — Z515 Encounter for palliative care: Secondary | ICD-10-CM | POA: Diagnosis present

## 2015-11-13 DIAGNOSIS — D649 Anemia, unspecified: Secondary | ICD-10-CM

## 2015-11-13 DIAGNOSIS — J449 Chronic obstructive pulmonary disease, unspecified: Secondary | ICD-10-CM | POA: Diagnosis not present

## 2015-11-13 DIAGNOSIS — Z9981 Dependence on supplemental oxygen: Secondary | ICD-10-CM | POA: Diagnosis not present

## 2015-11-13 DIAGNOSIS — Z825 Family history of asthma and other chronic lower respiratory diseases: Secondary | ICD-10-CM

## 2015-11-13 DIAGNOSIS — I252 Old myocardial infarction: Secondary | ICD-10-CM

## 2015-11-13 DIAGNOSIS — Z7189 Other specified counseling: Secondary | ICD-10-CM

## 2015-11-13 DIAGNOSIS — E7800 Pure hypercholesterolemia, unspecified: Secondary | ICD-10-CM | POA: Diagnosis present

## 2015-11-13 DIAGNOSIS — R0600 Dyspnea, unspecified: Secondary | ICD-10-CM | POA: Diagnosis present

## 2015-11-13 DIAGNOSIS — Z79899 Other long term (current) drug therapy: Secondary | ICD-10-CM

## 2015-11-13 DIAGNOSIS — Z833 Family history of diabetes mellitus: Secondary | ICD-10-CM | POA: Diagnosis not present

## 2015-11-13 DIAGNOSIS — E872 Acidosis: Secondary | ICD-10-CM | POA: Diagnosis not present

## 2015-11-13 DIAGNOSIS — Z8249 Family history of ischemic heart disease and other diseases of the circulatory system: Secondary | ICD-10-CM

## 2015-11-13 DIAGNOSIS — A419 Sepsis, unspecified organism: Secondary | ICD-10-CM | POA: Diagnosis not present

## 2015-11-13 DIAGNOSIS — I2510 Atherosclerotic heart disease of native coronary artery without angina pectoris: Secondary | ICD-10-CM | POA: Diagnosis not present

## 2015-11-13 DIAGNOSIS — R748 Abnormal levels of other serum enzymes: Secondary | ICD-10-CM | POA: Diagnosis not present

## 2015-11-13 DIAGNOSIS — J440 Chronic obstructive pulmonary disease with acute lower respiratory infection: Secondary | ICD-10-CM | POA: Diagnosis not present

## 2015-11-13 DIAGNOSIS — Z955 Presence of coronary angioplasty implant and graft: Secondary | ICD-10-CM

## 2015-11-13 DIAGNOSIS — Z794 Long term (current) use of insulin: Secondary | ICD-10-CM

## 2015-11-13 DIAGNOSIS — Y95 Nosocomial condition: Secondary | ICD-10-CM | POA: Diagnosis present

## 2015-11-13 DIAGNOSIS — J9621 Acute and chronic respiratory failure with hypoxia: Secondary | ICD-10-CM | POA: Diagnosis present

## 2015-11-13 DIAGNOSIS — Z808 Family history of malignant neoplasm of other organs or systems: Secondary | ICD-10-CM | POA: Diagnosis not present

## 2015-11-13 DIAGNOSIS — Z801 Family history of malignant neoplasm of trachea, bronchus and lung: Secondary | ICD-10-CM | POA: Diagnosis not present

## 2015-11-13 DIAGNOSIS — J441 Chronic obstructive pulmonary disease with (acute) exacerbation: Secondary | ICD-10-CM | POA: Diagnosis not present

## 2015-11-13 DIAGNOSIS — J189 Pneumonia, unspecified organism: Secondary | ICD-10-CM | POA: Diagnosis present

## 2015-11-13 DIAGNOSIS — R0902 Hypoxemia: Secondary | ICD-10-CM | POA: Diagnosis not present

## 2015-11-13 DIAGNOSIS — E119 Type 2 diabetes mellitus without complications: Secondary | ICD-10-CM | POA: Diagnosis not present

## 2015-11-13 DIAGNOSIS — Z90710 Acquired absence of both cervix and uterus: Secondary | ICD-10-CM

## 2015-11-13 DIAGNOSIS — Z6821 Body mass index (BMI) 21.0-21.9, adult: Secondary | ICD-10-CM | POA: Diagnosis not present

## 2015-11-13 DIAGNOSIS — I10 Essential (primary) hypertension: Secondary | ICD-10-CM | POA: Diagnosis present

## 2015-11-13 DIAGNOSIS — J9622 Acute and chronic respiratory failure with hypercapnia: Secondary | ICD-10-CM | POA: Diagnosis present

## 2015-11-13 DIAGNOSIS — E43 Unspecified severe protein-calorie malnutrition: Secondary | ICD-10-CM | POA: Diagnosis not present

## 2015-11-13 DIAGNOSIS — F419 Anxiety disorder, unspecified: Secondary | ICD-10-CM | POA: Diagnosis present

## 2015-11-13 DIAGNOSIS — R7989 Other specified abnormal findings of blood chemistry: Secondary | ICD-10-CM

## 2015-11-13 DIAGNOSIS — Z66 Do not resuscitate: Secondary | ICD-10-CM | POA: Diagnosis present

## 2015-11-13 DIAGNOSIS — Z87891 Personal history of nicotine dependence: Secondary | ICD-10-CM

## 2015-11-13 DIAGNOSIS — E538 Deficiency of other specified B group vitamins: Secondary | ICD-10-CM | POA: Diagnosis present

## 2015-11-13 DIAGNOSIS — R778 Other specified abnormalities of plasma proteins: Secondary | ICD-10-CM

## 2015-11-13 DIAGNOSIS — R402431 Glasgow coma scale score 3-8, in the field [EMT or ambulance]: Secondary | ICD-10-CM | POA: Diagnosis not present

## 2015-11-13 DIAGNOSIS — J9620 Acute and chronic respiratory failure, unspecified whether with hypoxia or hypercapnia: Secondary | ICD-10-CM | POA: Diagnosis present

## 2015-11-13 DIAGNOSIS — Z7952 Long term (current) use of systemic steroids: Secondary | ICD-10-CM

## 2015-11-13 DIAGNOSIS — E8729 Other acidosis: Secondary | ICD-10-CM | POA: Diagnosis present

## 2015-11-13 LAB — I-STAT ARTERIAL BLOOD GAS, ED
ACID-BASE EXCESS: 6 mmol/L — AB (ref 0.0–2.0)
Acid-Base Excess: 5 mmol/L — ABNORMAL HIGH (ref 0.0–2.0)
Acid-Base Excess: 4 mmol/L — ABNORMAL HIGH (ref 0.0–2.0)
BICARBONATE: 36.6 mmol/L — AB (ref 20.0–28.0)
BICARBONATE: 35.4 mmol/L — AB (ref 20.0–28.0)
BICARBONATE: 32.1 mmol/L — AB (ref 20.0–28.0)
O2 SAT: 100 %
O2 SAT: 96 %
O2 Saturation: 62 %
PCO2 ART: 99.6 mmHg — AB (ref 32.0–48.0)
PH ART: 7.173 — AB (ref 7.350–7.450)
PO2 ART: 44 mmHg — AB (ref 83.0–108.0)
TCO2: 40 mmol/L (ref 0–100)
TCO2: 38 mmol/L (ref 0–100)
TCO2: 34 mmol/L (ref 0–100)
pCO2 arterial: 99.4 mmHg (ref 32.0–48.0)
pCO2 arterial: 55.1 mmHg — ABNORMAL HIGH (ref 32.0–48.0)
pH, Arterial: 7.16 — CL (ref 7.350–7.450)
pH, Arterial: 7.374 (ref 7.350–7.450)
pO2, Arterial: 253 mmHg — ABNORMAL HIGH (ref 83.0–108.0)
pO2, Arterial: 84 mmHg (ref 83.0–108.0)

## 2015-11-13 LAB — CBC WITH DIFFERENTIAL/PLATELET
BASOS PCT: 0 %
Basophils Absolute: 0.1 10*3/uL (ref 0.0–0.1)
EOS ABS: 0.5 10*3/uL (ref 0.0–0.7)
EOS PCT: 2 %
HCT: 39 % (ref 36.0–46.0)
Hemoglobin: 11.5 g/dL — ABNORMAL LOW (ref 12.0–15.0)
Lymphocytes Relative: 16 %
Lymphs Abs: 3.6 10*3/uL (ref 0.7–4.0)
MCH: 28 pg (ref 26.0–34.0)
MCHC: 29.5 g/dL — AB (ref 30.0–36.0)
MCV: 95.1 fL (ref 78.0–100.0)
MONO ABS: 1.8 10*3/uL — AB (ref 0.1–1.0)
MONOS PCT: 8 %
Neutro Abs: 17 10*3/uL — ABNORMAL HIGH (ref 1.7–7.7)
Neutrophils Relative %: 74 %
Platelets: 357 10*3/uL (ref 150–400)
RBC: 4.1 MIL/uL (ref 3.87–5.11)
RDW: 12.8 % (ref 11.5–15.5)
WBC: 23.1 10*3/uL — ABNORMAL HIGH (ref 4.0–10.5)

## 2015-11-13 LAB — TROPONIN I
TROPONIN I: 0.18 ng/mL — AB (ref <0.03)
Troponin I: 0.46 ng/mL (ref <0.03)
Troponin I: 0.42 ng/mL (ref <0.03)

## 2015-11-13 LAB — URINALYSIS, ROUTINE W REFLEX MICROSCOPIC
Bilirubin Urine: NEGATIVE
Ketones, ur: 40 mg/dL — AB
Leukocytes, UA: NEGATIVE
Nitrite: NEGATIVE
PH: 5.5 (ref 5.0–8.0)
Protein, ur: NEGATIVE mg/dL

## 2015-11-13 LAB — URINE MICROSCOPIC-ADD ON

## 2015-11-13 LAB — BASIC METABOLIC PANEL
Anion gap: 11 (ref 5–15)
BUN: 14 mg/dL (ref 6–20)
CALCIUM: 9 mg/dL (ref 8.9–10.3)
CO2: 31 mmol/L (ref 22–32)
CREATININE: 0.74 mg/dL (ref 0.44–1.00)
Chloride: 95 mmol/L — ABNORMAL LOW (ref 101–111)
GFR calc non Af Amer: >60 mL/min (ref >60)
GLUCOSE: 370 mg/dL — AB (ref 65–99)
Potassium: 4.5 mmol/L (ref 3.5–5.1)
Sodium: 137 mmol/L (ref 135–145)

## 2015-11-13 LAB — INFLUENZA PANEL BY PCR (TYPE A & B)
H1N1 flu by pcr: NOT DETECTED
INFLAPCR: NEGATIVE
Influenza B By PCR: NEGATIVE

## 2015-11-13 LAB — GLUCOSE, CAPILLARY
Glucose-Capillary: 239 mg/dL — ABNORMAL HIGH (ref 65–99)
Glucose-Capillary: 324 mg/dL — ABNORMAL HIGH (ref 65–99)

## 2015-11-13 LAB — STREP PNEUMONIAE URINARY ANTIGEN: STREP PNEUMO URINARY ANTIGEN: NEGATIVE

## 2015-11-13 LAB — MRSA PCR SCREENING: MRSA BY PCR: NEGATIVE

## 2015-11-13 MED ORDER — METHYLPREDNISOLONE SODIUM SUCC 125 MG IJ SOLR
60.0000 mg | Freq: Four times a day (QID) | INTRAMUSCULAR | Status: DC
  Administered 2015-11-13 – 2015-11-15 (×8): 60 mg via INTRAVENOUS
  Filled 2015-11-13 (×8): qty 2

## 2015-11-13 MED ORDER — SODIUM CHLORIDE 0.9 % IV SOLN
INTRAVENOUS | Status: AC
  Administered 2015-11-13: 17:00:00 via INTRAVENOUS

## 2015-11-13 MED ORDER — IPRATROPIUM BROMIDE 0.02 % IN SOLN
0.5000 mg | RESPIRATORY_TRACT | Status: DC
  Administered 2015-11-13: 0.5 mg via RESPIRATORY_TRACT
  Filled 2015-11-13: qty 2.5

## 2015-11-13 MED ORDER — ONDANSETRON HCL 4 MG/2ML IJ SOLN
INTRAMUSCULAR | Status: AC
Start: 2015-11-13 — End: 2015-11-13
  Filled 2015-11-13: qty 2

## 2015-11-13 MED ORDER — MAGNESIUM SULFATE 2 GM/50ML IV SOLN: 2.0000 g | Freq: Once | INTRAVENOUS | Status: DC

## 2015-11-13 MED ORDER — ALBUTEROL SULFATE (2.5 MG/3ML) 0.083% IN NEBU
2.5000 mg | INHALATION_SOLUTION | RESPIRATORY_TRACT | Status: DC
  Administered 2015-11-13: 2.5 mg via RESPIRATORY_TRACT
  Filled 2015-11-13: qty 3

## 2015-11-13 MED ORDER — SODIUM CHLORIDE 0.9 % IV SOLN: 250.0000 mL | INTRAVENOUS | Status: DC | PRN

## 2015-11-13 MED ORDER — LEVOFLOXACIN IN D5W 500 MG/100ML IV SOLN: 500.0000 mg | INTRAVENOUS | Status: DC

## 2015-11-13 MED ORDER — SODIUM CHLORIDE 0.9% FLUSH
3.0000 mL | Freq: Two times a day (BID) | INTRAVENOUS | Status: DC
  Administered 2015-11-13 – 2015-11-15 (×3): 3 mL via INTRAVENOUS

## 2015-11-13 MED ORDER — ONDANSETRON HCL 4 MG/2ML IJ SOLN
4.0000 mg | Freq: Four times a day (QID) | INTRAMUSCULAR | Status: DC | PRN
  Administered 2015-11-13: 4 mg via INTRAVENOUS

## 2015-11-13 MED ORDER — VANCOMYCIN HCL IN DEXTROSE 1-5 GM/200ML-% IV SOLN
1000.0000 mg | Freq: Once | INTRAVENOUS | Status: AC
  Administered 2015-11-13: 1000 mg via INTRAVENOUS
  Filled 2015-11-13: qty 200

## 2015-11-13 MED ORDER — SODIUM CHLORIDE 0.9 % IV BOLUS (SEPSIS)
1000.0000 mL | Freq: Once | INTRAVENOUS | Status: AC
  Administered 2015-11-13: 1000 mL via INTRAVENOUS

## 2015-11-13 MED ORDER — SODIUM CHLORIDE 0.9 % IV SOLN
INTRAVENOUS | Status: DC
  Administered 2015-11-13: 12:00:00 via INTRAVENOUS
  Filled 2015-11-13 (×2): qty 1000

## 2015-11-13 MED ORDER — ALBUTEROL (5 MG/ML) CONTINUOUS INHALATION SOLN
10.0000 mg/h | INHALATION_SOLUTION | Freq: Once | RESPIRATORY_TRACT | Status: AC
  Administered 2015-11-13: 10 mg/h via RESPIRATORY_TRACT
  Filled 2015-11-13: qty 20

## 2015-11-13 MED ORDER — LEVOFLOXACIN IN D5W 750 MG/150ML IV SOLN: 750.0000 mg | INTRAVENOUS | Status: DC

## 2015-11-13 MED ORDER — MORPHINE SULFATE (PF) 4 MG/ML IV SOLN
4.0000 mg | Freq: Once | INTRAVENOUS | Status: AC
  Administered 2015-11-13: 4 mg via INTRAVENOUS
  Filled 2015-11-13: qty 1

## 2015-11-13 MED ORDER — MORPHINE SULFATE (PF) 2 MG/ML IV SOLN: 2.0000 mg | INTRAVENOUS | Status: DC | PRN

## 2015-11-13 MED ORDER — MAGNESIUM SULFATE 2 GM/50ML IV SOLN
2.0000 g | Freq: Once | INTRAVENOUS | Status: AC
  Administered 2015-11-13: 2 g via INTRAVENOUS
  Filled 2015-11-13: qty 50

## 2015-11-13 MED ORDER — INSULIN ASPART 100 UNIT/ML ~~LOC~~ SOLN
0.0000 [IU] | Freq: Every day | SUBCUTANEOUS | Status: DC
  Administered 2015-11-13: 4 [IU] via SUBCUTANEOUS
  Administered 2015-11-14: 2 [IU] via SUBCUTANEOUS
  Administered 2015-11-15: 3 [IU] via SUBCUTANEOUS
  Administered 2015-11-16: 4 [IU] via SUBCUTANEOUS

## 2015-11-13 MED ORDER — DEXTROSE 5 % IV SOLN
1.0000 g | Freq: Three times a day (TID) | INTRAVENOUS | Status: DC
  Administered 2015-11-13 – 2015-11-15 (×6): 1 g via INTRAVENOUS
  Filled 2015-11-13 (×8): qty 1

## 2015-11-13 MED ORDER — OSELTAMIVIR PHOSPHATE 75 MG PO CAPS
75.0000 mg | ORAL_CAPSULE | Freq: Two times a day (BID) | ORAL | Status: DC
  Administered 2015-11-13 – 2015-11-14 (×2): 75 mg via ORAL
  Filled 2015-11-13 (×2): qty 1

## 2015-11-13 MED ORDER — MORPHINE SULFATE (PF) 2 MG/ML IV SOLN
1.0000 mg | INTRAVENOUS | Status: DC | PRN
  Administered 2015-11-13 – 2015-11-14 (×3): 1 mg via INTRAVENOUS
  Filled 2015-11-13 (×3): qty 1

## 2015-11-13 MED ORDER — VANCOMYCIN HCL 500 MG IV SOLR
500.0000 mg | Freq: Two times a day (BID) | INTRAVENOUS | Status: DC
  Administered 2015-11-14 – 2015-11-15 (×3): 500 mg via INTRAVENOUS
  Filled 2015-11-13 (×4): qty 500

## 2015-11-13 MED ORDER — ACETAMINOPHEN 325 MG RE SUPP: 325.0000 mg | Freq: Four times a day (QID) | RECTAL | Status: DC | PRN

## 2015-11-13 MED ORDER — SODIUM CHLORIDE 0.9% FLUSH
3.0000 mL | INTRAVENOUS | Status: DC | PRN
  Administered 2015-11-17: 3 mL via INTRAVENOUS
  Filled 2015-11-13: qty 3

## 2015-11-13 MED ORDER — LORAZEPAM 2 MG/ML IJ SOLN
0.5000 mg | Freq: Four times a day (QID) | INTRAMUSCULAR | Status: DC | PRN
  Administered 2015-11-14 (×3): 0.5 mg via INTRAVENOUS
  Filled 2015-11-13 (×3): qty 1

## 2015-11-13 MED ORDER — IPRATROPIUM-ALBUTEROL 0.5-2.5 (3) MG/3ML IN SOLN
3.0000 mL | RESPIRATORY_TRACT | Status: DC
  Administered 2015-11-13 – 2015-11-17 (×24): 3 mL via RESPIRATORY_TRACT
  Filled 2015-11-13 (×25): qty 3

## 2015-11-13 MED ORDER — INSULIN ASPART 100 UNIT/ML ~~LOC~~ SOLN
0.0000 [IU] | Freq: Three times a day (TID) | SUBCUTANEOUS | Status: DC
  Administered 2015-11-13 – 2015-11-14 (×2): 3 [IU] via SUBCUTANEOUS
  Administered 2015-11-14: 7 [IU] via SUBCUTANEOUS
  Administered 2015-11-14 – 2015-11-15 (×4): 5 [IU] via SUBCUTANEOUS
  Administered 2015-11-16: 1 [IU] via SUBCUTANEOUS
  Administered 2015-11-16: 7 [IU] via SUBCUTANEOUS
  Administered 2015-11-16: 3 [IU] via SUBCUTANEOUS
  Administered 2015-11-17: 2 [IU] via SUBCUTANEOUS
  Administered 2015-11-17: 1 [IU] via SUBCUTANEOUS

## 2015-11-13 MED ORDER — LEVOFLOXACIN IN D5W 500 MG/100ML IV SOLN
500.0000 mg | Freq: Once | INTRAVENOUS | Status: AC
  Administered 2015-11-13: 500 mg via INTRAVENOUS
  Filled 2015-11-13: qty 100

## 2015-11-13 MED ORDER — SODIUM CHLORIDE 0.9% FLUSH
3.0000 mL | Freq: Two times a day (BID) | INTRAVENOUS | Status: DC
Start: 2015-11-13 — End: 2015-11-17
  Administered 2015-11-14 – 2015-11-16 (×4): 3 mL via INTRAVENOUS

## 2015-11-13 MED ORDER — IPRATROPIUM-ALBUTEROL 0.5-2.5 (3) MG/3ML IN SOLN: 3.0000 mL | Freq: Four times a day (QID) | RESPIRATORY_TRACT | Status: DC

## 2015-11-13 MED ORDER — ENOXAPARIN SODIUM 40 MG/0.4ML ~~LOC~~ SOLN
40.0000 mg | SUBCUTANEOUS | Status: DC
  Filled 2015-11-13 (×4): qty 0.4

## 2015-11-13 MED ORDER — LORAZEPAM 2 MG/ML IJ SOLN: 0.5000 mg | Freq: Three times a day (TID) | INTRAMUSCULAR | Status: DC | PRN

## 2015-11-13 NOTE — ED Provider Notes (Signed)
[facility] DEPT Provider Note   CSN: [id number] Arrival date & time: 11/13/15  [id number]     History   Chief Complaint No chief complaint on file.   HPI Judith Boyer is a 70 y.o. female.  She was brought in by ambulance after getting a call from home for respiratory distress. EMS reports virtually no breath sounds and started nebulizer treatments and gave her magnesium and methylprednisolone. While in route, she became unresponsive and they had to assist respirations with bag-valve-mask. She could not cooperate for CPAP. He reported initial oxygen saturation of 74% which came up to 88 percent while getting nebulizer treatment. Once bag-valve-mask respirations were initiated, and oxygen saturation came up to 100%.   The history is provided by the EMS personnel and a relative. The history is limited by the condition of the patient (Severe respiratory distress and unresponsive).    Past Medical History:  Diagnosis Date  . Anemia   . COPD (chronic obstructive pulmonary disease) ([place])   . Coronary artery disease   . High cholesterol   . History of blood transfusion 2014   "extremely anemic"  . Hypertension   . Myocardial infarction 02/2011  . On home oxygen therapy    "2L; 24h/day" (12/20/2014)  . Pneumonia    "couple times" (02/08/2014)  . Shortness of breath   . Type II diabetes mellitus ([place])    type 2    Patient Active Problem List   Diagnosis Date Noted  . Goals of care, counseling/discussion   . CAP (community acquired pneumonia) 06/10/2015  . CAD (coronary artery disease) 06/10/2015  . Chronic constipation 06/10/2015  . Encounter for palliative care   . 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 Hospice care patient   . Type 2 diabetes mellitus with complication, with long-term current use of insulin ([place])   . Hypoglycemia   . Hypokalemia   . SOB (shortness of breath)   . Acute respiratory failure ([place]) 01/30/2015  . HCAP (healthcare-associated pneumonia) 01/30/2015  . DNR  (do not resuscitate) discussion 12/29/2014  . Acute on chronic respiratory failure with hypoxia and hypercapnia (HCC)   . Hyperkalemia 12/25/2014  . Tobacco abuse   . Noncompliance   . Acute on chronic respiratory failure with hypoxia ([place])   . Acute exacerbation of chronic obstructive pulmonary disease (COPD) ([place])   . Diabetes mellitus with complication ([place])   . Chronic respiratory failure with hypercapnia ([place]) 09/24/2014  . Palliative care by specialist   . Dysphagia   . Dyspnea 08/13/2014  . Tachycardia   . Respiratory acidosis   . Coronary artery disease involving native coronary artery of native heart without angina pectoris   . Anxiety   . B12 deficiency 02/10/2014  . Protein-calorie malnutrition, severe ([place]) 02/09/2014  . Acute respiratory distress (HCC) 02/08/2014  . Vitamin B 12 deficiency 06/28/2013  . Palliative care encounter 02/25/2013  . Weakness generalized 02/25/2013  . COPD exacerbation ([place]) 02/20/2013  . Anemia 02/20/2013  . Nocturnal hypoxemia 12/30/2012  . COPD GOLD III 02/06/2011  . Smoker 02/06/2011  . Hypertension 02/06/2011  . Diabetes mellitus type 2, controlled, without complications ([place]) [id number]    Past Surgical History:  Procedure Laterality Date  . ABDOMINAL HYSTERECTOMY  1970's  . CARDIAC CATHETERIZATION  02/2011  . CORONARY ANGIOPLASTY WITH STENT PLACEMENT  12/2007   "2"  . LEFT HEART CATHETERIZATION WITH CORONARY ANGIOGRAM N/A 02/09/2011   Procedure: LEFT HEART CATHETERIZATION WITH CORONARY ANGIOGRAM;  Surgeon: Birdie Riddle, MD;  Location:  [facility] CATH LAB;  Service: Cardiovascular;  Laterality: N/A;  . SHOULDER ARTHROSCOPY W/ ROTATOR CUFF REPAIR Left   . TONSILLECTOMY      OB History    No data available       Home Medications    Prior to Admission medications   Medication Sig Start Date End Date Taking? Authorizing Provider  ACCU-CHEK AVIVA PLUS test strip TEST twice a day 10/27/15   [name], MD  acetaminophen (TYLENOL)  325 MG tablet Take 2 tablets (650 mg total) by mouth every 6 (six) hours as needed for mild pain (or Fever >/= 101). Patient taking differently: Take 650 mg by mouth every 4 (four) hours as needed for mild pain (or Fever >/= 101).  12/31/14   Albertine Patricia, MD  albuterol (PROAIR HFA) 108 (90 Base) MCG/ACT inhaler Inhale 2 puffs into the lungs every 4 (four) hours as needed for wheezing or shortness of breath. 08/05/15   [name], MD  albuterol (PROVENTIL) (2.5 MG/3ML) 0.083% nebulizer solution Take 3 mLs (2.5 mg total) by nebulization every 4 (four) hours as needed for wheezing or shortness of breath. DX: 491.21 10/27/15   [name], MD  arformoterol (BROVANA) 15 MCG/2ML NEBU Take 2 mLs (15 mcg total) by nebulization 2 (two) times daily. 12/31/14   Silver Huguenin Elgergawy, MD  atorvastatin (LIPITOR) 10 MG tablet Take 10 mg by mouth daily. 01/26/15   Historical Provider, MD  bisacodyl (DULCOLAX) 10 MG suppository Place 1 suppository (10 mg total) rectally daily as needed for moderate constipation. Patient not taking: Reported on 01/31/2015 12/31/14   Silver Huguenin Elgergawy, MD  budesonide (PULMICORT) 0.5 MG/2ML nebulizer solution Take 2 mLs (0.5 mg total) by nebulization 2 (two) times daily. 08/02/15   [name], MD  diltiazem (CARDIZEM CD) 180 MG 24 hr capsule Take 1 capsule (180 mg total) by mouth daily. 03/16/15   [name], MD  diltiazem (DILACOR XR) 240 MG 24 hr capsule Take 1 capsule (240 mg total) by mouth daily. 04/05/15   [name], MD  feeding supplement, GLUCERNA SHAKE, (GLUCERNA SHAKE) LIQD Take 237 mLs by mouth 3 (three) times daily between meals. Patient not taking: Reported on 04/03/2015 12/31/14   Silver Huguenin Elgergawy, MD  furosemide (LASIX) 20 MG tablet Take 20 mg by mouth daily. 06-09-15-06-14-15    Historical Provider, MD  gabapentin (NEURONTIN) 100 MG capsule Take 1 tablet at night  For restless leg syndrome. May increase to 2 tablets at night after 3-4 days if no improvement  10/28/15   [name], MD  Guaifenesin [facility] MAXIMUM STRENGTH) 1200 MG TB12 Take 1,200 mg by mouth 2 (two) times daily.    Historical Provider, MD  haloperidol (HALDOL) 5 MG tablet Take 5 mg by mouth every 4 (four) hours as needed for agitation (for anxiety or nausea). Reported on 04/03/2015 03/07/15   Historical Provider, MD  insulin aspart (NOVOLOG) 100 UNIT/ML injection Before each meal 3 times a day, 140-199 - 2 units, 200-250 - 4 units, 251-299 - 6 units,  300-349 - 8 units,  350 or above 10 units. Dispense syringes and needles as needed, Ok to switch to PEN if approved. Substitute to any brand approved. DX DM2, Code E11.65 07/06/15   [name], MD  insulin glargine (LANTUS) 100 UNIT/ML injection INJECT 25 UNITS INTO SKIN EVERY MORNING 07/06/15   [name], MD  Insulin Syringe-Needle U-100 (RELI-ON INS SYR .5CC/30G) 30G 0.5 ML MISC Use  as directed with insulin up to 3 times a day.  DX:  E11.9.  Please use short needles 09/12/15   [name], MD  ipratropium-albuterol (DUONEB) 0.5-2.5 (3) MG/3ML SOLN Take 3 mLs by nebulization 4 (four) times daily - after meals and at bedtime. 10/27/15   [name], MD  LORazepam (ATIVAN) 1 MG tablet Take 1 tablet every 4 hours as needed 08/04/15   [name], MD  Melatonin 3 MG TABS Take 3 mg by mouth at bedtime. Reported on 04/03/2015    Historical Provider, MD  montelukast (SINGULAIR) 10 MG tablet take 1 tablet by mouth once daily Patient taking differently: take 10 mg by mouth daily with supper 07/09/14   [name], MD  nitroGLYCERIN (NITROSTAT) 0.4 MG SL tablet Place 1 tablet (0.4 mg total) under the tongue every 5 (five) minutes x 3 doses as needed for chest pain. Patient not taking: Reported on 04/03/2015 02/07/12   Dixie Dials, MD  oxyCODONE (ROXICODONE) 5 MG immediate release tablet Take 1 tablet every 2-4 hours as needed for respiratory distress. (no more than 12 tabs in 24 hours) 10/28/15   [name], MD  OXYGEN Inhale 3 L into the lungs  continuous.     Historical Provider, MD  pantoprazole (PROTONIX) 40 MG tablet Take 40 mg by mouth daily. Reported on 04/03/2015    Historical Provider, MD  polyethylene glycol (MIRALAX / GLYCOLAX) packet Take 17 g by mouth daily as needed (constipation). Reported on 04/03/2015    Historical Provider, MD  predniSONE (DELTASONE) 10 MG tablet Take 2 tablets (20 mg total) by mouth every morning. 03/21/15   Carlyle Dolly, MD  prochlorperazine (COMPAZINE) 10 MG tablet Take 10 mg by mouth every 6 (six) hours as needed for nausea or vomiting.    Historical Provider, MD  QUEtiapine (SEROQUEL) 25 MG tablet Take 25 mg by mouth 2 (two) times daily. Reported on 04/03/2015 03/07/15   Historical Provider, MD  senna-docusate (SENOKOT-S) 8.6-50 MG tablet Take 2 tablets by mouth at bedtime. Patient taking differently: Take 2-4 tablets by mouth 2 (two) times daily.  12/31/14   Silver Huguenin Elgergawy, MD  sodium chloride (OCEAN) 0.65 % SOLN nasal spray Place 1 spray into both nostrils as needed for congestion. 12/31/14   Albertine Patricia, MD    Family History Family History  Problem Relation Age of Onset  . Lung cancer Mother     smoked  . Diabetes Mother   . Heart disease Maternal Grandmother   . Brain cancer Brother   . Diabetes Brother     Social History Social History  Substance Use Topics  . Smoking status: Former Smoker    Packs/day: 1.00    Years: 50.00    Types: Cigarettes    Quit date: 01/10/2013  . Smokeless tobacco: Never Used  . Alcohol use No     Allergies   Doxycycline; Codeine; Dulera [mometasone furo-formoterol fum]; Oxycodone; Adhesive [tape]; Latex; and Lisinopril   Review of Systems Review of Systems  Unable to perform ROS: Severe respiratory distress     Physical Exam Updated Vital Signs BP 181/77 (BP Location: Left Arm)   Pulse 118   Temp 97.4 F (36.3 C) (Axillary)   Resp 26   SpO2 100%   Physical Exam  Nursing note and vitals reviewed.  70 year old female,  resting comfortably and in no acute distress. Vital signs are Significant for hypertension and tachycardia and tachypnea. Oxygen saturation is 100%,  which is normal. Head is normocephalic and atraumatic. PERRLA, EOMI. Oropharynx is clear. Neck is nontender and supple without adenopathy or JVD. Back is nontender and there is no CVA tenderness. Lungs have decreased air flow with high-pitched wheezes throughout. Chest is nontender. Heart has regular rate and rhythm without murmur. Abdomen is soft, flat, nontender without masses or hepatosplenomegaly and peristalsis is normoactive. Extremities have no cyanosis or edema, full range of motion is present. Skin is warm and dry without rash. Neurologic: She is minimally responsive to deep painful stimuli but without focal findings.   ED Treatments / Results  Labs (all labs ordered are listed, but only abnormal results are displayed) Labs Reviewed  BASIC METABOLIC PANEL  CBC WITH DIFFERENTIAL/PLATELET  TROPONIN I  I-STAT ARTERIAL BLOOD GAS, ED    EKG  EKG Interpretation  Date/Time:  Sunday November 13 2015 04:32:11 EDT Ventricular Rate:  116 PR Interval:    QRS Duration: 91 QT Interval:  312 QTC Calculation: 434 R Axis:   84 Text Interpretation:  Sinus tachycardia Borderline right axis deviation ST depr, consider ischemia, inferior leads When compared with ECG of 06/10/2015, No significant change was found Confirmed by [facility]  MD, Kameka Whan ([id number]) on 11/13/2015 4:35:59 AM       Radiology Dg Chest Port 1 View  Result Date: 11/13/2015 CLINICAL DATA:  Acute onset of dyspnea.  Initial encounter. EXAM: PORTABLE CHEST 1 VIEW COMPARISON:  Chest radiograph performed 06/11/2015 FINDINGS: The lungs are well-aerated. Mild bibasilar opacities may reflect atelectasis or possibly mild pneumonia. There is no evidence of pleural effusion or pneumothorax. The cardiomediastinal silhouette is borderline enlarged. No acute osseous abnormalities are seen.  IMPRESSION: Mild bibasilar opacities may reflect atelectasis or possibly mild pneumonia. Borderline cardiomegaly. Electronically Signed   By: Garald Balding M.D.   On: 11/13/2015 05:05    Procedures Procedures (including critical care time) CRITICAL CARE Performed by: [id number] Total critical care time: 95 minutes Critical care time was exclusive of separately billable procedures and treating other patients. Critical care was necessary to treat or prevent imminent or life-threatening deterioration. Critical care was time spent personally by me on the following activities: development of treatment plan with patient and/or surrogate as well as nursing, discussions with consultants, evaluation of patient's response to treatment, examination of patient, obtaining history from patient or surrogate, ordering and performing treatments and interventions, ordering and review of laboratory studies, ordering and review of radiographic studies, pulse oximetry and re-evaluation of patient's condition.  Medications Ordered in ED Medications  levofloxacin (LEVAQUIN) IVPB 500 mg (not administered)  albuterol (PROVENTIL,VENTOLIN) solution continuous neb (10 mg/hr Nebulization Given 11/13/15 0440)     Initial Impression / Assessment and Plan / ED Course  I have reviewed the triage vital signs and the nursing notes.  Pertinent labs & imaging results that were available during my care of the patient were reviewed by me and considered in my medical decision making (see chart for details).  Clinical Course   COPD exacerbation with respiratory failure. Patient was taking preaxial respirations and was felt to be a poor candidate for BiPAP. However, it was noted on review of her chart that she had been made DO NOT RESUSCITATE and placed on hospice and palliative care when hospitalized last May. She was placed on BiPAP and has been maintaining her airway and adequate oxygen saturation with this. Son has arrived and  states that he once everything done to save his mother including intubation if necessary.  Chest x-ray showed no definite  pneumonia, but she is started on antibiotics because of COPD exacerbation. Blood gas did show respiratory acidosis. Elevated troponin is felt to be from demand ischemia. Based on the son's statement, I was prepared to intubate the patient. However, and another son is here who states that he has heard the patient stated multiple times that she did not wish to be intubated. At this point, I will follow what appears to be the patient's request as documented in prior consultation with palliative care and expressed to her son although I am asking the come to a agreement on CODE STATUS. Case is discussed with Dr. Lajuana Ripple of family practice service who agrees to admit the patient.  Final Clinical Impressions(s) / ED Diagnoses   Final diagnoses:  COPD exacerbation ([place])  Acute and chronic respiratory failure with hypercapnia (HCC)  Elevated troponin I level  Normocytic anemia    New Prescriptions New Prescriptions   No medications on file     Delora Fuel, MD [id number]

## 2015-11-13 NOTE — ED Notes (Signed)
Son concerned for overdose on oxycodone

## 2015-11-13 NOTE — ED Notes (Signed)
Pt's sats down to 69% on 21% FiO2, April RT notified. Pt in more distress, attempting to tripod position and using accessory muscles. Turned FiO2 up to 30% pt more comfortable.

## 2015-11-13 NOTE — Progress Notes (Signed)
2nd Critical ABG results given to MD.

## 2015-11-13 NOTE — Progress Notes (Signed)
Pt off BIPAP for now.  And on 10L.  Her sats were 100 so I reduced O2 flow to 8L.

## 2015-11-13 NOTE — ED Notes (Signed)
MD at bedside discussing intubation.

## 2015-11-13 NOTE — Progress Notes (Signed)
Pt called out, states she cant breath after getting off bed pan.  Sats 67% on O2 8l/min [place].  Placed on 100% NRB, RT notified to place pt. Back on BiPap.  IMTS notified of pt status.

## 2015-11-13 NOTE — Progress Notes (Signed)
Visit to check on patient in the ED meet with patients grand-daughter and Dr. Chase Caller he discussed with family starting morphine and removing the trumpet from her nose and either starting medications requiring an ICU stay or full comfort measures. Patient currently on Bipap with trumpet in place, patient complaining of a toothache and requesting something to drink. IV antibiotics being given at this time. Patient is alert at this time and asking when Bipap can be taken off.   Please call with any hospice needs [phone] Thank you Ardath Sax RN

## 2015-11-13 NOTE — Progress Notes (Signed)
Pharmacy Antibiotic Note  Judith Boyer is a 70 y.o. female with history of end-stage COPD admitted on 11/13/2015 with SOB.  Pharmacy has been consulted for vancomycin dosing.  Patient is also started on Levaquin and cefepime.  Baseline labs reviewed.   Plan: - Vanc 1gm IV x 1, then 500mg  IV Q12H x8 days total - Change LVQ to 750mg  IV Q24H, start tomorrow - Continue cefepime 1gm IV Q8H per MD - Monitor renal fxn, clinical progress, vanc trough as indicated - F/U CBGs, updated weight, narrow abx     Temp (24hrs), Avg:97.4 F (36.3 C), Min:97.4 F (36.3 C), Max:97.4 F (36.3 C)   Recent Labs Lab 11/13/15 0430  WBC 23.1*  CREATININE 0.74    CrCl cannot be calculated (Unknown ideal weight.).    Allergies  Allergen Reactions  . Doxycycline Anaphylaxis  . Codeine Other (See Comments)    hyperactivity  . Dulera [Mometasone Furo-Formoterol Fum] Hives and Rash  . Adhesive [Tape] Rash  . Latex Rash  . Lisinopril Cough  . Oxycodone Other (See Comments)    Zonked out on 10 mg,  Pt can tolerate 5 mg    Antimicrobials this admission:  Vanc 10/8 >> LVQ 10/8 >>  Dose adjustments this admission:  N/A  Microbiology results:  10/8 BCx - 10/8 UCx -    Tujuana Kilmartin D. Mina [place], PharmD, BCPS Pager:  [phone] 11/13/2015, 11:03 AM

## 2015-11-13 NOTE — Progress Notes (Signed)
Interval note  I had a family meeting with the patient and her family, including her 2 sons, 2 of her granddaughters, and a grandson-in-law. They state she is still part of hospice. She has a hospice nurse that comes out to help her. At baseline she's on 3L O2 24hrs per day. She is able to do all her ADLs and grocery shops.   She notes that she wants the BiPAP off right now. Her mouth is very dry with a nasty phlegm. She wants to eat and drink and then feels like she could go back on the BiPAP. Her family agrees she's at her baseline and able to make decisions. She does not wish to be intubated, she would be okay with antibiotics, fluids, vasoactive drugs, and CPR.    We dicussed the risk of coming off BiPAP (even intermittently) including the possibility of an irreversible decline in respiratory status leading to death. We discussed that if she becomes hypercarbic and altered, we would not be able to restart BiPAP.  Additionally, we discussed the risk of aspiration with eating then restarting BiPAP. Patient and family understand all risks involved.  In summary, the patient will be a DNI, but is amenable to CPR and all other life saving/prolonging interventions.  We will take her off BiPAP for 1-2 hours. She can have ice chips and small amounts of clears. While doing this, she'll be on [place]. She will wait 30 minutes after eating before being placed on BiPAP (at that time, she can be placed on NRB if needed). RN and family present for discussion. All are in agreement with the plan.  Archie Patten, MD [facility] Resident  11/13/2015, 3:17 PM

## 2015-11-13 NOTE — Progress Notes (Signed)
Critical ABG results given to MD. Changed made per MD verbal order.

## 2015-11-13 NOTE — Consult Note (Signed)
PULMONARY / CRITICAL CARE MEDICINE   Name: Judith Boyer MRN: [id number] DOB: [birth year]    ADMISSION DATE:  11/13/2015 CONSULTATION DATE:  11/13/2015   REFERRING MD:  Dr Wyonia Hough of [facility]  CHIEF COMPLAINT:  AECOPD  HISTORY OF PRESENT ILLNESS:   70 year female in stage COPD with chronic hypoxemic respiratory failure. Follows with Dr. Melvyn Novas in the pulmonary office. History is gained from talking to the family practice primary physician, review of the charts and in talking to the patient and her daughter-in-law. According to the daughter-in-law patient has been in and out of hospice many times. At this point in time she currently is fully functional and she is a categorical DO NOT INTUBATE but otherwise full medical care. Daughter-in-law suspects she is not on hospice anymore but according to the hospice nurse she is currently under hospice care. Patient is being admitted by family practice teaching service to stepdown unit but is still in the emergency department on 11/13/2015 for symptoms consistent with COPD exacerbation requiring BiPAP therapy. Pulmonary critical care has been consulted because of excessive class IV dyspnea despite BiPAP therapy and conventional COPD exacerbation treatment. Patient and daughter-in-law affirm DO NOT INTUBATE status. ABG showed severe acute on chronic respiratory acidosis with pH below 7.2. This slowly improved with BiPAP therapy but even then she is disproportionate class IV dyspnea.  PAST MEDICAL HISTORY :  She  has a past medical history of Anemia; COPD (chronic obstructive pulmonary disease) ([place]); Coronary artery disease; High cholesterol; History of blood transfusion (2014); Hypertension; Myocardial infarction (02/2011); On home oxygen therapy; Pneumonia; Shortness of breath; and Type II diabetes mellitus ([place]).  PAST SURGICAL HISTORY: She  has a past surgical history that includes Shoulder arthroscopy w/ rotator cuff repair (Left); left heart  catheterization with coronary angiogram (N/A, 02/09/2011); Tonsillectomy; Abdominal hysterectomy (1970's); Coronary angioplasty with stent (12/2007); and Cardiac catheterization (02/2011).  Allergies  Allergen Reactions  . Doxycycline Anaphylaxis  . Codeine Other (See Comments)    hyperactivity  . Dulera [Mometasone Furo-Formoterol Fum] Hives and Rash  . Adhesive [Tape] Rash  . Latex Rash  . Lisinopril Cough  . Oxycodone Other (See Comments)    Zonked out on 10 mg,  Pt can tolerate 5 mg    Current Facility-Administered Medications on File Prior to Encounter  Medication  . cyanocobalamin ((VITAMIN B-12)) injection 1,000 mcg   Current Outpatient Prescriptions on File Prior to Encounter  Medication Sig  . acetaminophen (TYLENOL) 325 MG tablet Take 2 tablets (650 mg total) by mouth every 6 (six) hours as needed for mild pain (or Fever >/= 101). (Patient taking differently: Take 650 mg by mouth every 4 (four) hours as needed for mild pain (or Fever >/= 101). )  . albuterol (PROAIR HFA) 108 (90 Base) MCG/ACT inhaler Inhale 2 puffs into the lungs every 4 (four) hours as needed for wheezing or shortness of breath.  Marland Kitchen albuterol (PROVENTIL) (2.5 MG/3ML) 0.083% nebulizer solution Take 3 mLs (2.5 mg total) by nebulization every 4 (four) hours as needed for wheezing or shortness of breath. DX: 491.21  . diltiazem (DILACOR XR) 240 MG 24 hr capsule Take 1 capsule (240 mg total) by mouth daily.  Marland Kitchen gabapentin (NEURONTIN) 100 MG capsule Take 1 tablet at night  For restless leg syndrome. May increase to 2 tablets at night after 3-4 days if no improvement (Patient taking differently: Take 100-200 mg by mouth See admin instructions. Take 100 mg by mouth at night for restless leg syndrome. May  increase to 200 mg at night after 3-4 days if no improvement.)  . insulin aspart (NOVOLOG) 100 UNIT/ML injection Before each meal 3 times a day, 140-199 - 2 units, 200-250 - 4 units, 251-299 - 6 units,  300-349 - 8 units,   350 or above 10 units. Dispense syringes and needles as needed, Ok to switch to PEN if approved. Substitute to any brand approved. DX DM2, Code E11.65 (Patient taking differently: Inject 2-16 Units into the skin See admin instructions. Inject 16 units subque 3 times daily with meals or sliding scale , 140-199 - 2 units, 200-250 - 4 units, 251-299 - 6 units,  300-349 - 8 units,  350 or above 10 units. Dispense syringes and needles as needed, Ok to switch to PEN if approved. Substitute to any brand approved. DX DM2, Code E11.65)  . insulin glargine (LANTUS) 100 UNIT/ML injection INJECT 25 UNITS INTO SKIN EVERY MORNING  . ipratropium-albuterol (DUONEB) 0.5-2.5 (3) MG/3ML SOLN Take 3 mLs by nebulization 4 (four) times daily - after meals and at bedtime.  Marland Kitchen LORazepam (ATIVAN) 1 MG tablet Take 1 tablet every 4 hours as needed (Patient taking differently: Take 1 mg by mouth every 4 (four) hours as needed for anxiety. )  . nitroGLYCERIN (NITROSTAT) 0.4 MG SL tablet Place 1 tablet (0.4 mg total) under the tongue every 5 (five) minutes x 3 doses as needed for chest pain.  Marland Kitchen oxyCODONE (ROXICODONE) 5 MG immediate release tablet Take 1 tablet every 2-4 hours as needed for respiratory distress. (no more than 12 tabs in 24 hours)  . ACCU-CHEK AVIVA PLUS test strip TEST twice a day (Patient not taking: Reported on 11/13/2015)  . arformoterol (BROVANA) 15 MCG/2ML NEBU Take 2 mLs (15 mcg total) by nebulization 2 (two) times daily. (Patient not taking: Reported on 11/13/2015)  . bisacodyl (DULCOLAX) 10 MG suppository Place 1 suppository (10 mg total) rectally daily as needed for moderate constipation. (Patient not taking: Reported on 11/13/2015)  . budesonide (PULMICORT) 0.5 MG/2ML nebulizer solution Take 2 mLs (0.5 mg total) by nebulization 2 (two) times daily. (Patient not taking: Reported on 11/13/2015)  . diltiazem (CARDIZEM CD) 180 MG 24 hr capsule Take 1 capsule (180 mg total) by mouth daily. (Patient not taking: Reported  on 11/13/2015)  . feeding supplement, GLUCERNA SHAKE, (GLUCERNA SHAKE) LIQD Take 237 mLs by mouth 3 (three) times daily between meals. (Patient not taking: Reported on 11/13/2015)  . Insulin Syringe-Needle U-100 (RELI-ON INS SYR .5CC/30G) 30G 0.5 ML MISC Use as directed with insulin up to 3 times a day.  DX:  E11.9.  Please use short needles (Patient not taking: Reported on 11/13/2015)  . Melatonin 3 MG TABS Take 3 mg by mouth at bedtime. Reported on 04/03/2015  . montelukast (SINGULAIR) 10 MG tablet take 1 tablet by mouth once daily (Patient not taking: Reported on 11/13/2015)  . OXYGEN Inhale 3 L into the lungs continuous.   . predniSONE (DELTASONE) 10 MG tablet Take 2 tablets (20 mg total) by mouth every morning. (Patient not taking: Reported on 11/13/2015)  . senna-docusate (SENOKOT-S) 8.6-50 MG tablet Take 2 tablets by mouth at bedtime. (Patient not taking: Reported on 11/13/2015)  . sodium chloride (OCEAN) 0.65 % SOLN nasal spray Place 1 spray into both nostrils as needed for congestion. (Patient not taking: Reported on 11/13/2015)    FAMILY HISTORY:  Her indicated that her mother is deceased. She indicated that her father is deceased. She indicated that her sister is deceased. She indicated  that one of her three brothers is deceased. She indicated that her maternal grandmother is deceased. She indicated that her maternal grandfather is deceased. She indicated that her paternal grandmother is deceased. She indicated that her paternal grandfather is deceased. She indicated that her son is alive.    SOCIAL HISTORY: She  reports that she quit smoking about 2 years ago. Her smoking use included Cigarettes. She has a 50.00 pack-year smoking history. She has never used smokeless tobacco. She reports that she does not drink alcohol or use drugs.  REVIEW OF SYSTEMS:   Not elicitable incomplete because of dyspnea  VITAL SIGNS: BP 112/63   Pulse 116   Temp 98.3 F (36.8 C) (Oral)   Resp 19   SpO2 100%    HEMODYNAMICS:    VENTILATOR SETTINGS: Vent Mode: BIPAP FiO2 (%):  [21 %-40 %] 30 % Set Rate:  [16 bmp] 16 bmp PEEP:  [5 cmH20-6 cmH20] 5 cmH20  INTAKE / OUTPUT: No intake/output data recorded.  PHYSICAL EXAMINATION: General:  Frail elderly female lying in the emergency department stretcher and trauma B room Neuro:  Alert and oriented 3. Not fully intelligible because of BiPAP mask HEENT:  BiPAP on. Nasal trumpet present no elevated JVP Cardiovascular:  Sinus tach Lungs:  AE diminished. Mod resp distress with accessory muscle use and difficulty finishing sentences present. No wheeze. Prolonged expiration barrel chest present Abdomen:  Scaphoid. No tenderness Musculoskeletal:  No cyanosis no clubbing no edema Skin:  Dry and flaky  LABS: PULMONARY  Recent Labs Lab 11/13/15 0455 11/13/15 0538 11/13/15 1142  PHART 7.173* 7.160* 7.374  PCO2ART 99.6* 99.4* 55.1*  PO2ART 253.0* 44.0* 84.0  HCO3 36.6* 35.4* 32.1*  TCO2 40 38 34  O2SAT 100.0 62.0 96.0    CBC  Recent Labs Lab 11/13/15 0430  HGB 11.5*  HCT 39.0  WBC 23.1*  PLT 357    COAGULATION No results for input(s): INR in the last 168 hours.  CARDIAC   Recent Labs Lab 11/13/15 0430  TROPONINI 0.18*   No results for input(s): PROBNP in the last 168 hours.   CHEMISTRY  Recent Labs Lab 11/13/15 0430  NA 137  K 4.5  CL 95*  CO2 31  GLUCOSE 370*  BUN 14  CREATININE 0.74  CALCIUM 9.0   CrCl cannot be calculated (Unknown ideal weight.).   LIVER No results for input(s): AST, ALT, ALKPHOS, BILITOT, PROT, ALBUMIN, INR in the last 168 hours.   INFECTIOUS No results for input(s): LATICACIDVEN, PROCALCITON in the last 168 hours.   ENDOCRINE CBG (last 3)  No results for input(s): GLUCAP in the last 72 hours.       IMAGING x48h  - image(s) personally visualized  -   highlighted in bold Dg Chest Port 1 View  Result Date: 11/13/2015 CLINICAL DATA:  Acute onset of dyspnea.  Initial  encounter. EXAM: PORTABLE CHEST 1 VIEW COMPARISON:  Chest radiograph performed 06/11/2015 FINDINGS: The lungs are well-aerated. Mild bibasilar opacities may reflect atelectasis or possibly mild pneumonia. There is no evidence of pleural effusion or pneumothorax. The cardiomediastinal silhouette is borderline enlarged. No acute osseous abnormalities are seen. IMPRESSION: Mild bibasilar opacities may reflect atelectasis or possibly mild pneumonia. Borderline cardiomegaly. Electronically Signed   By: Garald Balding M.D.   On: 11/13/2015 05:05       ASSESSMENT / PLAN:  PULMONARY A: Acute on chronic hypoxemic and hypercapnic respiratory failure with acidosis secondary to COPD exacerbation Possible pneumonia Significant class IV dyspnea DO NOT  INTUBATE status - currently on hospice care but at home functional  P:   Increase nebulizer  frequency from every 6 hours to Combivent every 4 hours Continues BiPAP for dyspnea and respiratory acidosis Agree with antibiotics Respiratory virus panel and flu PCR panel - also Tamiflu because of intention to test and therefore treat (stop if flu PCR is negative] Start Solu-Medrol Give morphine 4 mg IV stat and then between 2 and 4 mg every 12 when necessary for dyspnea relief - registered nurse directed with careful M.D. Observation Okay for ice chips otherwise nothing by mouth except for meds and ice chips Removal nasal trumpet because of discomfort IV magnesium sulfate empiric because of COPD exacerbation DO NOT INTUBATE  Overall care here is full medical Plus current current palliation but transitioned to full palliation with morphine drip if decompensates   She is at high risk of death and decompensation in the next 24 hours   Daughter-in-law Mrs. Mihalovich and patient updated in the emergency room. Pulmonary  care will continue to follow        The patient is critically ill with multiple organ systems failure and requires high complexity  decision making for assessment and support, frequent evaluation and titration of therapies, application of advanced monitoring technologies and extensive interpretation of multiple databases.   Critical Care Time devoted to patient care services described in this note is  30  Minutes. This time reflects time of care of this signee Dr Brand Males. This critical care time does not reflect procedure time, or teaching time or supervisory time of PA/NP/Med student/Med Resident etc but could involve care discussion time    Dr. Brand Males, M.D., [facility].C.P Pulmonary and Critical Care Medicine Staff Physician [place] [facility] Pager: [phone], If no answer or between  15:00h - 7:00h: call [phone]  11/13/2015 12:50 PM

## 2015-11-13 NOTE — ED Triage Notes (Signed)
Pt arrives via EMS from home, found down by family, unknown last known well. Pt unresponsive upon EMS arrival, RR 35-45 HR 144, 74% on RA. Attempted CPAP on scene, SPO2 got up to 83%. Pt moved to truck, at which time, spontaneous respirations declined and patient "turned blue." EMS began bagging with BVM. And applied duoneb, SPo2 up to 99%. CBG in field 384, BP 160/90. 20G IV placed in L hand. 125 MG solumedrol and 2G mag given in field. Pt found to be DNR once admitted to [facility] in ED, placed on bipap. MD at bedside

## 2015-11-13 NOTE — H&P (Signed)
[facility] Admission History and Physical Service Pager: [phone]  Patient name: Judith Boyer Medical record number: [id number] Date of birth: February 22, 1945 Age: 70 y.o. Gender: female  Primary Care Provider: Jenny Reichmann, MD Consultants: CCM, Palliative care Code Status: DNI (discussed on admission)  Chief Complaint: SOB  Assessment and Plan: Judith Boyer is a 70 y.o. female presenting with acute on chronic respiratory failure . PMH is significant for End stage COPD, CAD w/ stent, HTN, DM2, Protein calorie malnutrition  # Acute on chronic respiratory failure:  Secondary to COPD exacerbation vs pneumonia vs ischemia.  Desaturations in the ED down to 69%.  Bipap initiated.  Patient now saturating 97%.  RR 23. BP 108/38. HR 110.  Meeting sepsis criteria.  Afebrile.  ABG w/ pH 7.160, pCO2: 99.4, pO2 44.0. Bicarb 35.4. Trop 0.18. WBC 23.1. S/p Solumedrol 125mg . Levaquin 500mg  IV (10/7>)  CXR with bibasilar opacities concerning for pna vs atelectasis.  Prior notes suggest patient's home O2 requirement is 3L. - Admit to SDU under Dr Ardelia Mems - Telemetry - Continue Bipap - Continue Levaquin, Solumedrol - Consider broadening abx if clinically worsening to cover HCAP - c/s CCM: Albuterol q2, Atrovent q4, s/p Mg 2gm x1. Will see today.  Appreciate recs.   - Bolus 1 L now BP is soft and patient tachycardic. - Consider Morphine IV for air hunger - f/u Blood and urine cultures (drawn 45 minutes after starting abx) - repeat ABG - repeat CXR in am - CBC in am - Trend trops, though I suspect this is demand ischemia  # CAD w/ stent in 2009/ HTN.  BP 108/38.  Echo 2015: EF 0000000, normal systolic fnx.  Unsure of what meds patient is actually taking.  Son is bringing in med list. - Hold any home PO meds for now - No BB listed on previous med list.  Consider adding Metoprolol  # DM2: A1c 7.2 (01/2015).  - sSSI while hospitalized - CBG QAC and HS - A1c with  am labs   # Undetermined GOC: Patient recently discharged from [facility] per son, Judith Boyer.  Upon reviewed chart, it appears patient's PCP was receiving calls from [facility]) in mid September.  Unsure if patient receiving home hospice, though son denies this.  Patient has not POA.  She is a DNI and reiterates this on admission.  There is apparently, another son who wants patient to be full code. - c/s Palliative for GOC/ POA.  Discussed case with palliative RN this am.  She will make patient's case a priority. - DNI placed in chart - Gary's contact [phone]  FEN/GI: NPO, bolus then MIVF x12 hours. Prophylaxis: Lovenox sub-q  Disposition: SDU  History of Present Illness:  Judith Boyer is a 70 y.o. female presenting with acute on chronic respiratory failure  Patient's son, Judith Boyer, provides much of the history.  He notes that his mother has struggled with COPD for some time now.  She had an acute worsening of her breathing today and was found unresponsive upon EMS arrival to her home.  She was only able to maintain about 83% on CPAP, so she was bagged, given duonebs and solumedrol on the scene.  She notes that she had been feeling febrile and tired over the last few days.  She reports sick contacts but is unable to tell me who.  She notes that she does NOT want to be intubated but does wish to have all other measures performed.  She  denies CP, nausea, vomiting, hemoptysis. Endorses weakness, malaise, SOB.  Additionally, son notes that she is completely independent of ADLs.  She is cognitively in tact.  There is no POA.  He reports "she does not want to be intubated because she knows she will likely not come off the ventilator".  He also suspects that she continues to smoke but she denies this.  Review Of Systems: Per HPI with the following additions: none  ROS  Patient Active Problem List   Diagnosis Date Noted  . Acute and chronic respiratory failure with hypercapnia ([place])  11/13/2015  . Goals of care, counseling/discussion   . CAP (community acquired pneumonia) 06/10/2015  . CAD (coronary artery disease) 06/10/2015  . Chronic constipation 06/10/2015  . Encounter for palliative care   . End stage COPD ([place])   . Hospice care patient   . Type 2 diabetes mellitus with complication, with long-term current use of insulin ([place])   . Hypoglycemia   . Hypokalemia   . SOB (shortness of breath)   . Acute respiratory failure ([place]) 01/30/2015  . HCAP (healthcare-associated pneumonia) 01/30/2015  . DNR (do not resuscitate) discussion 12/29/2014  . Acute on chronic respiratory failure with hypoxia and hypercapnia (HCC)   . Hyperkalemia 12/25/2014  . Tobacco abuse   . Noncompliance   . Acute on chronic respiratory failure with hypoxia ([place])   . Acute exacerbation of chronic obstructive pulmonary disease (COPD) ([place])   . Diabetes mellitus with complication ([place])   . Chronic respiratory failure with hypercapnia ([place]) 09/24/2014  . Palliative care by specialist   . Dysphagia   . Dyspnea 08/13/2014  . Tachycardia   . Respiratory acidosis   . Coronary artery disease involving native coronary artery of native heart without angina pectoris   . Anxiety   . B12 deficiency 02/10/2014  . Protein-calorie malnutrition, severe ([place]) 02/09/2014  . Acute respiratory distress (HCC) 02/08/2014  . Vitamin B 12 deficiency 06/28/2013  . Palliative care encounter 02/25/2013  . Weakness generalized 02/25/2013  . COPD exacerbation ([place]) 02/20/2013  . Anemia 02/20/2013  . Nocturnal hypoxemia 12/30/2012  . COPD GOLD III 02/06/2011  . Smoker 02/06/2011  . Hypertension 02/06/2011  . Diabetes mellitus type 2, controlled, without complications ([place]) [id number]    Past Medical History: Past Medical History:  Diagnosis Date  . Anemia   . COPD (chronic obstructive pulmonary disease) ([place])   . Coronary artery disease   . High cholesterol   . History of blood transfusion 2014    "extremely anemic"  . Hypertension   . Myocardial infarction 02/2011  . On home oxygen therapy    "2L; 24h/day" (12/20/2014)  . Pneumonia    "couple times" (02/08/2014)  . Shortness of breath   . Type II diabetes mellitus ([place])    type 2    Past Surgical History: Past Surgical History:  Procedure Laterality Date  . ABDOMINAL HYSTERECTOMY  1970's  . CARDIAC CATHETERIZATION  02/2011  . CORONARY ANGIOPLASTY WITH STENT PLACEMENT  12/2007   "2"  . LEFT HEART CATHETERIZATION WITH CORONARY ANGIOGRAM N/A 02/09/2011   Procedure: LEFT HEART CATHETERIZATION WITH CORONARY ANGIOGRAM;  Surgeon: Birdie Riddle, MD;  Location: [facility]ATH [facility];  Service: Cardiovascular;  Laterality: N/A;  . SHOULDER ARTHROSCOPY W/ ROTATOR CUFF REPAIR Left   . TONSILLECTOMY      Social History: Social History  Substance Use Topics  . Smoking status: Former Smoker    Packs/day: 1.00    Years: 50.00    Types:  Cigarettes    Quit date: 01/10/2013  . Smokeless tobacco: Never Used  . Alcohol use No   Additional social history: ?smoker. No ETOH, no drugs  Please also refer to relevant sections of EMR.  Family History: Family History  Problem Relation Age of Onset  . Lung cancer Mother     smoked  . Diabetes Mother   . Heart disease Maternal Grandmother   . Brain cancer Brother   . Diabetes Brother    Allergies and Medications: Allergies  Allergen Reactions  . Doxycycline Anaphylaxis  . Codeine Other (See Comments)    hyperactivity  . Dulera [Mometasone Furo-Formoterol Fum] Hives and Rash  . Oxycodone Other (See Comments)    Zonked out on 10 mg,  Pt can tolerate 5 mg  . Adhesive [Tape] Rash  . Latex Rash  . Lisinopril Cough   Current Facility-Administered Medications on File Prior to Encounter  Medication Dose Route Frequency Provider Last Rate Last Dose  . cyanocobalamin ((VITAMIN B-12)) injection 1,000 mcg  1,000 mcg Intramuscular Q30 days Dorian Heckle English, PA   1,000 mcg at 02/22/14 1029   Current  Outpatient Prescriptions on File Prior to Encounter  Medication Sig Dispense Refill  . ACCU-CHEK AVIVA PLUS test strip TEST twice a day 1 each 11  . acetaminophen (TYLENOL) 325 MG tablet Take 2 tablets (650 mg total) by mouth every 6 (six) hours as needed for mild pain (or Fever >/= 101). (Patient taking differently: Take 650 mg by mouth every 4 (four) hours as needed for mild pain (or Fever >/= 101). )    . albuterol (PROAIR HFA) 108 (90 Base) MCG/ACT inhaler Inhale 2 puffs into the lungs every 4 (four) hours as needed for wheezing or shortness of breath. 1 Inhaler 11  . albuterol (PROVENTIL) (2.5 MG/3ML) 0.083% nebulizer solution Take 3 mLs (2.5 mg total) by nebulization every 4 (four) hours as needed for wheezing or shortness of breath. DX: 491.21 525 mL 11  . arformoterol (BROVANA) 15 MCG/2ML NEBU Take 2 mLs (15 mcg total) by nebulization 2 (two) times daily. 120 mL   . atorvastatin (LIPITOR) 10 MG tablet Take 10 mg by mouth daily.  0  . bisacodyl (DULCOLAX) 10 MG suppository Place 1 suppository (10 mg total) rectally daily as needed for moderate constipation. (Patient not taking: Reported on 01/31/2015) 12 suppository 0  . budesonide (PULMICORT) 0.5 MG/2ML nebulizer solution Take 2 mLs (0.5 mg total) by nebulization 2 (two) times daily. 120 mL 11  . diltiazem (CARDIZEM CD) 180 MG 24 hr capsule Take 1 capsule (180 mg total) by mouth daily. 30 capsule 5  . diltiazem (DILACOR XR) 240 MG 24 hr capsule Take 1 capsule (240 mg total) by mouth daily. 30 capsule 11  . feeding supplement, GLUCERNA SHAKE, (GLUCERNA SHAKE) LIQD Take 237 mLs by mouth 3 (three) times daily between meals. (Patient not taking: Reported on 04/03/2015)  0  . furosemide (LASIX) 20 MG tablet Take 20 mg by mouth daily. 06-09-15-06-14-15    . gabapentin (NEURONTIN) 100 MG capsule Take 1 tablet at night  For restless leg syndrome. May increase to 2 tablets at night after 3-4 days if no improvement 90 capsule 3  . Guaifenesin (MUCINEX  MAXIMUM STRENGTH) 1200 MG TB12 Take 1,200 mg by mouth 2 (two) times daily.    . haloperidol (HALDOL) 5 MG tablet Take 5 mg by mouth every 4 (four) hours as needed for agitation (for anxiety or nausea). Reported on 04/03/2015  0  .  insulin aspart (NOVOLOG) 100 UNIT/ML injection Before each meal 3 times a day, 140-199 - 2 units, 200-250 - 4 units, 251-299 - 6 units,  300-349 - 8 units,  350 or above 10 units. Dispense syringes and needles as needed, Ok to switch to PEN if approved. Substitute to any brand approved. DX DM2, Code E11.65 1 vial 12  . insulin glargine (LANTUS) 100 UNIT/ML injection INJECT 25 UNITS INTO SKIN EVERY MORNING 10 vial 5  . Insulin Syringe-Needle U-100 (RELI-ON INS SYR .5CC/30G) 30G 0.5 ML MISC Use as directed with insulin up to 3 times a day.  DX:  E11.9.  Please use short needles 100 each 11  . ipratropium-albuterol (DUONEB) 0.5-2.5 (3) MG/3ML SOLN Take 3 mLs by nebulization 4 (four) times daily - after meals and at bedtime. 360 mL 11  . LORazepam (ATIVAN) 1 MG tablet Take 1 tablet every 4 hours as needed 180 tablet 2  . Melatonin 3 MG TABS Take 3 mg by mouth at bedtime. Reported on 04/03/2015    . montelukast (SINGULAIR) 10 MG tablet take 1 tablet by mouth once daily (Patient taking differently: take 10 mg by mouth daily with supper) 30 tablet 5  . nitroGLYCERIN (NITROSTAT) 0.4 MG SL tablet Place 1 tablet (0.4 mg total) under the tongue every 5 (five) minutes x 3 doses as needed for chest pain. (Patient not taking: Reported on 04/03/2015) 25 tablet 1  . oxyCODONE (ROXICODONE) 5 MG immediate release tablet Take 1 tablet every 2-4 hours as needed for respiratory distress. (no more than 12 tabs in 24 hours) 200 tablet 0  . OXYGEN Inhale 3 L into the lungs continuous.     . pantoprazole (PROTONIX) 40 MG tablet Take 40 mg by mouth daily. Reported on 04/03/2015    . polyethylene glycol (MIRALAX / GLYCOLAX) packet Take 17 g by mouth daily as needed (constipation). Reported on 04/03/2015     . predniSONE (DELTASONE) 10 MG tablet Take 2 tablets (20 mg total) by mouth every morning. 30 tablet 0  . prochlorperazine (COMPAZINE) 10 MG tablet Take 10 mg by mouth every 6 (six) hours as needed for nausea or vomiting.    Marland Kitchen QUEtiapine (SEROQUEL) 25 MG tablet Take 25 mg by mouth 2 (two) times daily. Reported on 04/03/2015  0  . senna-docusate (SENOKOT-S) 8.6-50 MG tablet Take 2 tablets by mouth at bedtime. (Patient taking differently: Take 2-4 tablets by mouth 2 (two) times daily. )    . sodium chloride (OCEAN) 0.65 % SOLN nasal spray Place 1 spray into both nostrils as needed for congestion.  0    Objective: BP (!) 108/38   Pulse 110   Temp 97.4 F (36.3 C) (Axillary)   Resp 23   SpO2 97%  Exam: General: tired appearing elderly female, bipap in place, son Judith Boyer at bedside Eyes: sclera white ENTM: did not assess as Bipap is in place Neck: supple Cardiovascular: tachycardic, difficult to appreciate any murmurs Respiratory: globally decreased breath sounds, poor air movement, no discrete wheeze/ rhonchi/ rales, dyspneic with speech, +accessory muscle use Gastrointestinal: soft, obese, NT/ND, +BS MSK: cool LE, no edema Derm: skin dry and in tact Neuro: follows commands, sleepy, responds to questioning appropriately Psych: mood stable  Labs and Imaging: CBC BMET   Recent Labs Lab 11/13/15 0430  WBC 23.1*  HGB 11.5*  HCT 39.0  PLT 357    Recent Labs Lab 11/13/15 0430  NA 137  K 4.5  CL 95*  CO2 31  BUN 14  CREATININE 0.74  GLUCOSE 370*  CALCIUM 9.0     ABG    Component Value Date/Time   PHART 7.160 (LL) 11/13/2015 0538   PCO2ART 99.4 (HH) 11/13/2015 0538   PO2ART 44.0 (L) 11/13/2015 0538   HCO3 35.4 (H) 11/13/2015 0538   TCO2 38 11/13/2015 0538   ACIDBASEDEF 1.0 02/04/2011 1735   O2SAT 62.0 11/13/2015 0538   Dg Chest Port 1 View  Result Date: 11/13/2015 CLINICAL DATA:  Acute onset of dyspnea.  Initial encounter. EXAM: PORTABLE CHEST 1 VIEW COMPARISON:   Chest radiograph performed 06/11/2015 FINDINGS: The lungs are well-aerated. Mild bibasilar opacities may reflect atelectasis or possibly mild pneumonia. There is no evidence of pleural effusion or pneumothorax. The cardiomediastinal silhouette is borderline enlarged. No acute osseous abnormalities are seen. IMPRESSION: Mild bibasilar opacities may reflect atelectasis or possibly mild pneumonia. Borderline cardiomegaly. Electronically Signed   By: Garald Balding M.D.   On: 11/13/2015 05:05   EKG sinus tach w/ T changes unchanged from previous  Janora Norlander, DO 11/13/2015, 8:40 AM PGY-3, [place] Intern pager: [phone], text pages welcome

## 2015-11-13 NOTE — ED Notes (Signed)
[facility] [place]

## 2015-11-13 NOTE — Procedures (Signed)
Called to placed pt on BiPAP.  Upon arrival pt on NRB in distress (increased HR, use of accessory muscles, increase RR, decreased BS). Pt placed on BiPAP and tx given.

## 2015-11-14 ENCOUNTER — Inpatient Hospital Stay (HOSPITAL_COMMUNITY)

## 2015-11-14 DIAGNOSIS — Z7189 Other specified counseling: Secondary | ICD-10-CM

## 2015-11-14 DIAGNOSIS — R0902 Hypoxemia: Secondary | ICD-10-CM

## 2015-11-14 DIAGNOSIS — R748 Abnormal levels of other serum enzymes: Secondary | ICD-10-CM

## 2015-11-14 DIAGNOSIS — I2583 Coronary atherosclerosis due to lipid rich plaque: Secondary | ICD-10-CM

## 2015-11-14 DIAGNOSIS — I2510 Atherosclerotic heart disease of native coronary artery without angina pectoris: Secondary | ICD-10-CM

## 2015-11-14 DIAGNOSIS — I10 Essential (primary) hypertension: Secondary | ICD-10-CM

## 2015-11-14 DIAGNOSIS — E119 Type 2 diabetes mellitus without complications: Secondary | ICD-10-CM

## 2015-11-14 DIAGNOSIS — J9621 Acute and chronic respiratory failure with hypoxia: Secondary | ICD-10-CM

## 2015-11-14 DIAGNOSIS — J441 Chronic obstructive pulmonary disease with (acute) exacerbation: Secondary | ICD-10-CM

## 2015-11-14 DIAGNOSIS — Z515 Encounter for palliative care: Secondary | ICD-10-CM

## 2015-11-14 DIAGNOSIS — J189 Pneumonia, unspecified organism: Secondary | ICD-10-CM

## 2015-11-14 LAB — CBC WITH DIFFERENTIAL/PLATELET
Basophils Absolute: 0 10*3/uL (ref 0.0–0.1)
Basophils Relative: 0 %
EOS ABS: 0 10*3/uL (ref 0.0–0.7)
Eosinophils Relative: 0 %
HEMATOCRIT: 32.1 % — AB (ref 36.0–46.0)
HEMOGLOBIN: 9.6 g/dL — AB (ref 12.0–15.0)
LYMPHS ABS: 0.6 10*3/uL — AB (ref 0.7–4.0)
LYMPHS PCT: 3 %
MCH: 27.7 pg (ref 26.0–34.0)
MCHC: 29.9 g/dL — AB (ref 30.0–36.0)
MCV: 92.8 fL (ref 78.0–100.0)
MONOS PCT: 3 %
Monocytes Absolute: 0.5 10*3/uL (ref 0.1–1.0)
NEUTROS PCT: 94 %
Neutro Abs: 17.7 10*3/uL — ABNORMAL HIGH (ref 1.7–7.7)
Platelets: 260 10*3/uL (ref 150–400)
RBC: 3.46 MIL/uL — AB (ref 3.87–5.11)
RDW: 12.7 % (ref 11.5–15.5)
WBC: 18.8 10*3/uL — AB (ref 4.0–10.5)

## 2015-11-14 LAB — COMPREHENSIVE METABOLIC PANEL
ALBUMIN: 2.9 g/dL — AB (ref 3.5–5.0)
ALK PHOS: 75 U/L (ref 38–126)
ALT: 24 U/L (ref 14–54)
ANION GAP: 8 (ref 5–15)
AST: 22 U/L (ref 15–41)
BILIRUBIN TOTAL: 0.5 mg/dL (ref 0.3–1.2)
BUN: 19 mg/dL (ref 6–20)
CO2: 30 mmol/L (ref 22–32)
Calcium: 8.1 mg/dL — ABNORMAL LOW (ref 8.9–10.3)
Chloride: 103 mmol/L (ref 101–111)
Creatinine, Ser: 0.56 mg/dL (ref 0.44–1.00)
GFR calc Af Amer: >60 mL/min (ref >60)
GFR calc non Af Amer: >60 mL/min (ref >60)
GLUCOSE: 264 mg/dL — AB (ref 65–99)
POTASSIUM: 4.5 mmol/L (ref 3.5–5.1)
SODIUM: 141 mmol/L (ref 135–145)
TOTAL PROTEIN: 5.7 g/dL — AB (ref 6.5–8.1)

## 2015-11-14 LAB — RESPIRATORY PANEL BY PCR
ADENOVIRUS-RVPPCR: NOT DETECTED
BORDETELLA PERTUSSIS-RVPCR: NOT DETECTED
CHLAMYDOPHILA PNEUMONIAE-RVPPCR: NOT DETECTED
CORONAVIRUS 229E-RVPPCR: NOT DETECTED
CORONAVIRUS HKU1-RVPPCR: NOT DETECTED
Coronavirus NL63: NOT DETECTED
Coronavirus OC43: NOT DETECTED
INFLUENZA B-RVPPCR: NOT DETECTED
Influenza A: NOT DETECTED
MYCOPLASMA PNEUMONIAE-RVPPCR: NOT DETECTED
Metapneumovirus: NOT DETECTED
Parainfluenza Virus 1: NOT DETECTED
Parainfluenza Virus 2: NOT DETECTED
Parainfluenza Virus 3: NOT DETECTED
Parainfluenza Virus 4: NOT DETECTED
Respiratory Syncytial Virus: NOT DETECTED
Rhinovirus / Enterovirus: NOT DETECTED

## 2015-11-14 LAB — URINE CULTURE: Culture: <10000 — AB

## 2015-11-14 LAB — GLUCOSE, CAPILLARY
GLUCOSE-CAPILLARY: 246 mg/dL — AB (ref 65–99)
GLUCOSE-CAPILLARY: 333 mg/dL — AB (ref 65–99)
Glucose-Capillary: 271 mg/dL — ABNORMAL HIGH (ref 65–99)
Glucose-Capillary: 209 mg/dL — ABNORMAL HIGH (ref 65–99)

## 2015-11-14 LAB — LEGIONELLA PNEUMOPHILA SEROGP 1 UR AG: L. pneumophila Serogp 1 Ur Ag: NEGATIVE

## 2015-11-14 LAB — HIV ANTIBODY (ROUTINE TESTING W REFLEX): HIV Screen 4th Generation wRfx: NONREACTIVE

## 2015-11-14 LAB — TROPONIN I: TROPONIN I: 0.43 ng/mL — AB (ref <0.03)

## 2015-11-14 LAB — MAGNESIUM: Magnesium: 2.7 mg/dL — ABNORMAL HIGH (ref 1.7–2.4)

## 2015-11-14 LAB — PHOSPHORUS: Phosphorus: 2.2 mg/dL — ABNORMAL LOW (ref 2.5–4.6)

## 2015-11-14 MED ORDER — OXYCODONE HCL 5 MG PO TABS
5.0000 mg | ORAL_TABLET | ORAL | Status: DC | PRN
  Administered 2015-11-14 – 2015-11-17 (×11): 5 mg via ORAL
  Filled 2015-11-14 (×11): qty 1

## 2015-11-14 MED ORDER — MORPHINE SULFATE (PF) 2 MG/ML IV SOLN
2.0000 mg | Freq: Four times a day (QID) | INTRAVENOUS | Status: DC | PRN
  Administered 2015-11-15: 2 mg via INTRAVENOUS
  Filled 2015-11-14: qty 1

## 2015-11-14 MED ORDER — MORPHINE SULFATE (PF) 2 MG/ML IV SOLN
2.0000 mg | INTRAVENOUS | Status: DC | PRN
  Administered 2015-11-14 (×3): 2 mg via INTRAVENOUS
  Filled 2015-11-14 (×3): qty 1

## 2015-11-14 MED ORDER — LORAZEPAM 1 MG PO TABS
1.0000 mg | ORAL_TABLET | ORAL | Status: DC | PRN
  Administered 2015-11-14 – 2015-11-17 (×10): 1 mg via ORAL
  Filled 2015-11-14 (×10): qty 1

## 2015-11-14 NOTE — Progress Notes (Signed)
Family Medicine Teaching Service Daily Progress Note Intern Pager: [phone]  Patient name: Judith Boyer Medical record number: [id number] Date of birth: [birth year] Age: 70 y.o. Gender: female  Primary Care Provider: Jenny Reichmann, MD Consultants: CCM, Palliative Care Code Status: DNI  Pt Overview and Major Events to Date:  Admit 10/8 Start Cefepime (10/8-) Start Vancomycin (10/9-)  Assessment and Plan: Judith Boyer is a 70 y.o. female presenting with acute on chronic respiratory failure . PMH is significant for End stage COPD, CAD w/ stent, HTN, DM2, Protein calorie malnutrition  # Acute on chronic respiratory failure:  Secondary to COPD exacerbation vs pneumonia vs ischemia.    CXR with bibasilar opacities concerning for pna vs atelectasis.  - Monitor closely in SDU - Continue Bipap: Patient is on and off it intermittently.  Bipap uncomfortable so removes it as desired.  RN paged with desat to 63s off the bipap and requiring 8L o2.  Desats with movement ie trying to get on bedpan or sit on side of bed.   -Treating as HCAP given recent history of hospice care -Day 2 Cefepime, Day 1 Vancomycin  -Continue IV antibiotics, consider transitioning to po antibiotics tomorrow pending clinical improvement. - Continue Solumedrol 60 mg Q6 - c/s CCM: Albuterol q2, Atrovent q4. Appreciate recs.   - f/u Blood and urine cultures pending - pH 7.1 on admission, repeat ABG with pH 7.3 - repeat CXR this am -->  Chronic cardiopericardial enlargement. Hyperinflation and interstitial coarsening. No edema or convincing pneumonia. No effusion or pneumothorax. - CBC in am - Trend trops 0.18>0.46>0.42>0.43  though suspect this is demand ischemia  # CAD w/ stent in 2009/ HTN.  BP 108/38.  Echo 2015: EF 0000000, normal systolic fnx. - Hold any home PO meds for now - No BB listed on previous med list.  Consider adding Metoprolol.  # DM2: A1c 7.2 (01/2015).  - sSSI while hospitalized - CBG QAC  and HS - A1c with am labs   # Undetermined GOC: Patient recently discharged from [facility] per son, Judith Boyer.  Upon reviewed chart, it appears patient's PCP was receiving calls from Judith Boyer) in mid September.   Patient has not POA. Family meeting to discuss goals of care yesterday.  Patient does not wish to be intubated however would like all other measures to be taken.  -Palliative consulted, to see today - DNI placed in chart - Judith Boyer's contact [phone]  FEN/GI: NPO, bolus then MIVF x12 hours. Prophylaxis: Lovenox sub-q  Disposition: Pending clinical improvement  Subjective:  Patient off Bipap this AM and states she feels a bit better than yesterday.  Was very hungry and is glad they let her eat this morning.  Does feel short of breath off the bipap.  No other complaints at current time.   Objective: Temp:  [97.6 F (36.4 C)-99 F (37.2 C)] 97.6 F (36.4 C) (10/09 0806) Pulse Rate:  [104-139] 118 (10/09 0806) Resp:  [16-30] 23 (10/09 0806) BP: (117-173)/(46-97) 140/63 (10/09 0806) SpO2:  [67 %-100 %] 100 % (10/09 1136) FiO2 (%):  [30 %-100 %] 40 % (10/09 1136) Weight:  [118 lb 13.3 oz (53.9 kg)] 118 lb 13.3 oz (53.9 kg) (10/08 1500) Physical Exam: General: tired appearing elderly female, bipap in place Eyes: sclera white ENTM: MMM, oropharynx clear Neck: supple Cardiovascular: tachycardic, did not appreciate any murmurs Respiratory: poor air movement, no discrete wheeze/ rhonchi/ rales, dyspneic with speech Gastrointestinal: soft, obese, NT/ND, +BS MSK: cool LE, no edema Derm:  skin dry and intact Neuro: follows commands, awake and alert, responds to questioning appropriately Psych: mood stable, pleasant  Laboratory:  Recent Labs Lab 11/13/15 0430 11/14/15 0352  WBC 23.1* 18.8*  HGB 11.5* 9.6*  HCT 39.0 32.1*  PLT 357 260    Recent Labs Lab 11/13/15 0430 11/14/15 0352  NA 137 141  K 4.5 4.5  CL 95* 103  CO2 31 30  BUN 14 19  CREATININE 0.74 0.56   CALCIUM 9.0 8.1*  PROT  --  5.7*  BILITOT  --  0.5  ALKPHOS  --  75  ALT  --  24  AST  --  22  GLUCOSE 370* 264*   Imaging/Diagnostic Tests:  Portable Chest 1 View  Result Date: 11/14/2015 CLINICAL DATA:  Acute on chronic respiratory failure EXAM: PORTABLE CHEST 1 VIEW COMPARISON:  Yesterday FINDINGS: Chronic cardiopericardial enlargement. Stable aortic and hilar contour. Hyperinflation and interstitial coarsening. No edema or convincing pneumonia. No effusion or pneumothorax. Asymmetric density at the medial left apex is considered osseous based on two-view chest x-ray 06/11/2015. IMPRESSION: 1. COPD without acute superimposed finding. 2. Chronic cardiomegaly. Electronically Signed   By: Monte Fantasia M.D.   On: 11/14/2015 07:52   Dg Chest Port 1 View  Result Date: 11/13/2015 CLINICAL DATA:  Acute onset of dyspnea.  Initial encounter. EXAM: PORTABLE CHEST 1 VIEW COMPARISON:  Chest radiograph performed 06/11/2015 FINDINGS: The lungs are well-aerated. Mild bibasilar opacities may reflect atelectasis or possibly mild pneumonia. There is no evidence of pleural effusion or pneumothorax. The cardiomediastinal silhouette is borderline enlarged. No acute osseous abnormalities are seen. IMPRESSION: Mild bibasilar opacities may reflect atelectasis or possibly mild pneumonia. Borderline cardiomegaly. Electronically Signed   By: Garald Balding M.D.   On: 11/13/2015 05:05   [name], MD 11/14/2015, 12:26 PM PGY-1, [place] Intern pager: [phone], text pages welcome

## 2015-11-14 NOTE — Consult Note (Signed)
Consultation Note Date: 11/14/2015   Patient Name: Judith Boyer  DOB: 1945/12/12  MRN: [id number]  Age / Sex: 70 y.o., female  PCP: [name], MD Referring Physician: Leeanne Rio, MD  Reason for Consultation: Establishing goals of care  HPI/Patient Profile: 70 y.o. female  with past medical history of end stage COPD, CAD s/p stent, hypertension, diabetes mellitus 2 well known to palliative team admitted on 11/13/2015 with acute on chronic respiratory failure. Currently being followed by [facility] who she had assisting her at home - per their record she was DNR at home and she has been to [name] x 2. Chronic severe anxiety and SOB. Per records she has 2 sons who disagree on her care plan (i.e. DNR, hospice). Seems she has compromised with DNI at current time.   Clinical Assessment and Goals of Care: I have met Ms. Davidoff today. She is very short of breath and very anxious. Difficult to have a conversation. She feels as though she does not know what she wants (in regards to care vs comfort and code status). She is questioning even intubation. She has historically struggled with decisions and based on how bad she feels or how scared she is but has consistently desired no intubation. She admits being torn in her decisions.   She also says that her main concern are her sons (neither are at her bedside now). Per notes the sons disagree about how aggressive to be with her care. She is worried about leaving her sons. I tried to discuss further her wishes but she is frustrated/confused and tells me that she "she wants to live" but also that she is often tired and wanders about being able to continue living this way. We were interrupted by some visitors.   I did come by later in hopes of catching her family but none at bedside. She was in distress after being off BiPAP and was on phone as I was coming in the  room. She then required the bedside commode. At this time was in distress and requiring BiPAP back on and unable to discuss with me further. I also called son [name] but no answer and voicemail is full. I will f/u tomorrow.   Primary Decision Maker NEXT OF KIN 2 sons, no designated/documented HCPOA    SUMMARY OF RECOMMENDATIONS   - GOC are unclear, she changes her mind often on her wishes - Currently followed by hospice  Code Status/Advance Care Planning:  Limited code   Symptom Management:   Anxiety: Ativan 1 mg po every 4 hours prn.   SOB: OxyIR 5 mg every 4 hours prn. Morphine 2 mg IV every 6 hours prn.   Medications adjusted to mirror home meds as requested by patient and family per RN. PO meds may provide longer relief with less severe lethargy hopefully.   Consider restarting home fluoxetine and other home meds she is currently taking as she is tolerating off BiPAP for 30-60 min at a time to eat and take breaks.  Palliative Prophylaxis:   Aspiration, Bowel Regimen and Delirium Protocol  Additional Recommendations (Limitations, Scope, Preferences):  Recommend continued discussion regarding GOC and desired treatments.   Psycho-social/Spiritual:   Desire for further Chaplaincy support:yes  Additional Recommendations: Caregiving  Support/Resources  Prognosis:   < 6 months with acute on chronic respiratory failure and decompensating COPD.   Discharge Planning: To Be Determined      Primary Diagnoses: Present on Admission: . Acute and chronic respiratory failure with hypercapnia ([place]) . Anxiety . CAD (coronary artery disease) . Hypertension . End stage COPD ([place]) . Protein-calorie malnutrition, severe ([place]) . Respiratory acidosis . Acute on chronic respiratory failure ([place]) . Acute on chronic respiratory failure with hypoxia and hypercapnia ([facility])   I have reviewed the medical record, interviewed the patient and family, and examined the patient. The following  aspects are pertinent.  Past Medical History:  Diagnosis Date  . Anemia   . COPD (chronic obstructive pulmonary disease) ([place])   . Coronary artery disease   . High cholesterol   . History of blood transfusion 2014   "extremely anemic"  . Hypertension   . Myocardial infarction 02/2011  . On home oxygen therapy    "2L; 24h/day" (12/20/2014)  . Pneumonia    "couple times" (02/08/2014)  . Shortness of breath   . Type II diabetes mellitus ([place])    type 2   Social History   Social History  . Marital status: Widowed    Spouse name: N/A  . Number of children: N/A  . Years of education: N/A   Social History Main Topics  . Smoking status: Former Smoker    Packs/day: 1.00    Years: 50.00    Types: Cigarettes    Quit date: 01/10/2013  . Smokeless tobacco: Never Used  . Alcohol use No  . Drug use: No  . Sexual activity: No   Other Topics Concern  . None   Social History Narrative  . None   Family History  Problem Relation Age of Onset  . Lung cancer Mother     smoked  . Diabetes Mother   . Heart disease Maternal Grandmother   . Brain cancer Brother   . Diabetes Brother    Scheduled Meds: . ceFEPime (MAXIPIME) IV  1 g Intravenous Q8H  . enoxaparin (LOVENOX) injection  40 mg Subcutaneous Q24H  . insulin aspart  0-5 Units Subcutaneous QHS  . insulin aspart  0-9 Units Subcutaneous TID WC  . ipratropium-albuterol  3 mL Nebulization Q4H  . methylPREDNISolone (SOLU-MEDROL) injection  60 mg Intravenous Q6H  . oseltamivir  75 mg Oral BID  . sodium chloride flush  3 mL Intravenous Q12H  . sodium chloride flush  3 mL Intravenous Q12H  . vancomycin  500 mg Intravenous Q12H   Continuous Infusions:  PRN Meds:.sodium chloride, acetaminophen, LORazepam, morphine injection, ondansetron (ZOFRAN) IV, sodium chloride flush Allergies  Allergen Reactions  . Doxycycline Anaphylaxis  . Codeine Other (See Comments)    hyperactivity  . Dulera [Mometasone Furo-Formoterol Fum] Hives and  Rash  . Adhesive [Tape] Rash  . Latex Rash  . Lisinopril Cough  . Oxycodone Other (See Comments)    Zonked out on 10 mg,  Pt can tolerate 5 mg   Review of Systems  Constitutional: Positive for activity change and fatigue.  Respiratory: Positive for cough and shortness of breath.   Psychiatric/Behavioral: Positive for agitation.    Physical Exam  Constitutional: She is oriented to person, place, and time. She appears  well-developed and well-nourished.  HENT:  Head: Normocephalic and atraumatic.  Cardiovascular: An irregularly irregular rhythm present. Tachycardia present.   Pulmonary/Chest: Accessory muscle usage present. Tachypnea noted. She is in respiratory distress. She has decreased breath sounds.  Abdominal: Normal appearance.  Neurological: She is alert and oriented to person, place, and time.  Nursing note and vitals reviewed.   Vital Signs: BP 140/63 (BP Location: Left Arm)   Pulse (!) 118   Temp 97.6 F (36.4 C) (Axillary)   Resp (!) 23   Ht '5\' 2"'  (1.575 m)   Wt 53.9 kg (118 lb 13.3 oz)   SpO2 100%   BMI 21.73 kg/m  Pain Assessment: No/denies pain POSS *See Group Information*: 1-Acceptable,Awake and alert Pain Score: Asleep   SpO2: SpO2: 100 % O2 Device:SpO2: 100 % O2 Flow Rate: .O2 Flow Rate (L/min): 8 L/min  IO: Intake/output summary:  Intake/Output Summary (Last 24 hours) at 11/14/15 1027 Last data filed at 11/14/15 0600  Gross per 24 hour  Intake           2427.5 ml  Output              500 ml  Net           1927.5 ml    LBM: Last BM Date: 11/12/15 Baseline Weight: Weight: 53.9 kg (118 lb 13.3 oz) Most recent weight: Weight: 53.9 kg (118 lb 13.3 oz)     Palliative Assessment/Data:     Time In/Out: 1020-1100, 1400-1415 Time Total: 25mn Greater than 50%  of this time was spent counseling and coordinating care related to the above assessment and plan.  Signed by: AVinie Sill NP Palliative Medicine Team Pager # [phone](M-F  8a-5p) Team Phone # [phone](Nights/Weekends)

## 2015-11-14 NOTE — Progress Notes (Signed)
PULMONARY / CRITICAL CARE MEDICINE   Name: Judith Boyer MRN: [id number] DOB: 1945/12/01    ADMISSION DATE:  11/13/2015 CONSULTATION DATE:  11/13/2015   REFERRING MD:  Dr Wyonia Hough of [facility]  CHIEF COMPLAINT:  AECOPD  HISTORY OF PRESENT ILLNESS:   70 year female in stage COPD with chronic hypoxemic respiratory failure. Follows with Dr. Melvyn Novas in the pulmonary office. History is gained from talking to the family practice primary physician, review of the charts and in talking to the patient and her daughter-in-law. According to the daughter-in-law patient has been in and out of hospice many times. At this point in time she currently is fully functional and she is a categorical DO NOT INTUBATE but otherwise full medical care. Daughter-in-law suspects she is not on hospice anymore but according to the hospice nurse she is currently under hospice care. Patient is being admitted by family practice teaching service to stepdown unit but is still in the emergency department on 11/13/2015 for symptoms consistent with COPD exacerbation requiring BiPAP therapy. Pulmonary critical care has been consulted because of excessive class IV dyspnea despite BiPAP therapy and conventional COPD exacerbation treatment. Patient and daughter-in-law affirm DO NOT INTUBATE status. ABG showed severe acute on chronic respiratory acidosis with pH below 7.2. This slowly improved with BiPAP therapy but even then she is disproportionate class IV dyspnea.  SUBJECTIVE:  Sob this am.  Tachycardic. Got very SOB, hypoxic getting off bedpan.  Some improvement with PRN ativan, PRN morphine.    VITAL SIGNS: BP 140/63 (BP Location: Left Arm)   Pulse (!) 118   Temp 97.6 F (36.4 C) (Axillary)   Resp (!) 23   Ht 5\' 2"  (1.575 m)   Wt 53.9 kg (118 lb 13.3 oz)   SpO2 100%   BMI 21.73 kg/m   HEMODYNAMICS:    VENTILATOR SETTINGS: Vent Mode: BIPAP FiO2 (%):  [30 %-100 %] 60 % Set Rate:  [16 bmp] 16 bmp PEEP:  [5 cmH20] 5  cmH20  INTAKE / OUTPUT: I/O last 3 completed shifts: In: 2427.5 [P.O.:240; I.V.:1937.5; IV Piggyback:250] Out: 500 [Urine:500]  PHYSICAL EXAMINATION: General:  Frail elderly female, uncomfortable appearing on bipap with intermittent leak Neuro:  Alert and oriented 3. Not fully intelligible because of BiPAP mask HEENT:  BiPAP on. Mm moist  Cardiovascular:  Sinus tach Lungs:  resps even, mildly labored on bipap, diminished throughout, slightly increased WOB. Barrel chest.  Abdomen:  Scaphoid. No tenderness Musculoskeletal:  No cyanosis no clubbing no edema Skin:  Dry and flaky  LABS: PULMONARY  Recent Labs Lab 11/13/15 0455 11/13/15 0538 11/13/15 1142  PHART 7.173* 7.160* 7.374  PCO2ART 99.6* 99.4* 55.1*  PO2ART 253.0* 44.0* 84.0  HCO3 36.6* 35.4* 32.1*  TCO2 40 38 34  O2SAT 100.0 62.0 96.0    CBC  Recent Labs Lab 11/13/15 0430 11/14/15 0352  HGB 11.5* 9.6*  HCT 39.0 32.1*  WBC 23.1* 18.8*  PLT 357 260    COAGULATION No results for input(s): INR in the last 168 hours.  CARDIAC    Recent Labs Lab 11/13/15 0430 11/13/15 1528 11/13/15 2207 11/14/15 0352  TROPONINI 0.18* 0.46* 0.42* 0.43*   No results for input(s): PROBNP in the last 168 hours.   CHEMISTRY  Recent Labs Lab 11/13/15 0430 11/14/15 0352  NA 137 141  K 4.5 4.5  CL 95* 103  CO2 31 30  GLUCOSE 370* 264*  BUN 14 19  CREATININE 0.74 0.56  CALCIUM 9.0 8.1*  MG  --  2.7*  PHOS  --  2.2*   Estimated Creatinine Clearance: 51 mL/min (by C-G formula based on SCr of 0.56 mg/dL).   LIVER  Recent Labs Lab 11/14/15 0352  AST 22  ALT 24  ALKPHOS 75  BILITOT 0.5  PROT 5.7*  ALBUMIN 2.9*     INFECTIOUS No results for input(s): LATICACIDVEN, PROCALCITON in the last 168 hours.   ENDOCRINE CBG (last 3)   Recent Labs  11/13/15 1653 11/13/15 2109 11/14/15 0803  GLUCAP 239* 324* 271*         IMAGING x48h  - image(s) personally visualized  -   highlighted in  bold Portable Chest 1 View  Result Date: 11/14/2015 CLINICAL DATA:  Acute on chronic respiratory failure EXAM: PORTABLE CHEST 1 VIEW COMPARISON:  Yesterday FINDINGS: Chronic cardiopericardial enlargement. Stable aortic and hilar contour. Hyperinflation and interstitial coarsening. No edema or convincing pneumonia. No effusion or pneumothorax. Asymmetric density at the medial left apex is considered osseous based on two-view chest x-ray 06/11/2015. IMPRESSION: 1. COPD without acute superimposed finding. 2. Chronic cardiomegaly. Electronically Signed   By: Monte Fantasia M.D.   On: 11/14/2015 07:52   Dg Chest Port 1 View  Result Date: 11/13/2015 CLINICAL DATA:  Acute onset of dyspnea.  Initial encounter. EXAM: PORTABLE CHEST 1 VIEW COMPARISON:  Chest radiograph performed 06/11/2015 FINDINGS: The lungs are well-aerated. Mild bibasilar opacities may reflect atelectasis or possibly mild pneumonia. There is no evidence of pleural effusion or pneumothorax. The cardiomediastinal silhouette is borderline enlarged. No acute osseous abnormalities are seen. IMPRESSION: Mild bibasilar opacities may reflect atelectasis or possibly mild pneumonia. Borderline cardiomegaly. Electronically Signed   By: Garald Balding M.D.   On: 11/13/2015 05:05       ASSESSMENT / PLAN:  PULMONARY A: Acute on chronic hypoxemic and hypercapnic respiratory failure with acidosis secondary to COPD exacerbation Possible pneumonia Significant class IV dyspnea DO NOT INTUBATE status - currently on hospice care but at home functional  P:   Continue q4 duonebs  Continues BiPAP for dyspnea and respiratory acidosis Agree with antibiotics Respiratory virus panel and flu PCR panel - also Tamiflu because of intention to test and therefore treat (stop if flu PCR is negative] Continue Solu-Medrol Will change morphine to 2-4mg  q4 PRN - has been getting 1mg  q2h with minimal improvement  Okay for ice chips otherwise nothing by mouth except  for meds and ice chips DO NOT INTUBATE   Overall care here is full medical Plus current palliation but transitioned to full palliation with morphine drip if decompensates further.    Updated patient who is alert and oriented 10/9 but no family in room.    Nickolas Madrid, NP 11/14/2015  9:53 AM Pager: [phone] or [phone]  Attending Note:  70 year old female with extensive PMH who presents to [facility] with hypoxic and hypercarbic acute on chronic respiratory failure with a  COPD exacerbation and possible PNA.  Has been sent home with hospice before given her end-stage pulmonary disease and now is a DNI only.  Patient decompensates quite easily with any activity.  On exam, patient is on BiPAP, mildly labored and breathsounds are very quite.  I reviewed CXR myself, hyperinflation noted with cardiomegaly.  Confirmed do not intubate, patient asking for morphine for comfort.  Discussed with PCCM-NP.  Acute on chronic respiratory failure:  - BiPAP for comfort at this point.  Hypoxemia:  - Titrate O2 for sat of 88-92%.  - Do not allow to have O2 sats >  95%.  GOC:  - DNI  - Need to further address realistic expectations, palliative care working with patient.  HCAP  - Vanc  - Cefepime  - F/U on cultures.  Patient seen and examined, agree with above note.  I dictated the care and orders written for this patient under my direction.  Rush Farmer, MD [phone]

## 2015-11-14 NOTE — Progress Notes (Signed)
[place] [name] RN note.  This is a GIP related and covered admission of 11/13/15, per Dr. Karie Georges.  Patient has an [facility] diagnosis of COPD.  Code status at this time is a limited code:  No intubation.   Transfer summary and [facility] medication list placed on shadow chart.    Visited patient at bedside.  There are no family members/visitors present.  [name] should be in to visit later today.    Patient is alert and talkative.  [name].  RN, Archie Patten, reports patient was off bi-pap for 45 minutes to an hour this morning to eat breakfast, and was noted with increased work of breathing, but oxygen saturations were normal.  Bi-pap was then placed back on.  Oxygen saturation 99% per monitor.    [name], NP, with Palliative Medicine Service, is waiting to see patient to discuss goals of care.    [name].  [name].  She states she does not remember what caused her to be on the floor at home, and does not remember most of what occurred on 10/8.  Efrain Sella, Rn, BSN [phone]

## 2015-11-15 ENCOUNTER — Telehealth: Payer: Self-pay

## 2015-11-15 ENCOUNTER — Other Ambulatory Visit: Payer: Self-pay

## 2015-11-15 DIAGNOSIS — F419 Anxiety disorder, unspecified: Secondary | ICD-10-CM

## 2015-11-15 DIAGNOSIS — E872 Acidosis: Secondary | ICD-10-CM

## 2015-11-15 LAB — GLUCOSE, CAPILLARY
GLUCOSE-CAPILLARY: 288 mg/dL — AB (ref 65–99)
GLUCOSE-CAPILLARY: 265 mg/dL — AB (ref 65–99)
GLUCOSE-CAPILLARY: 278 mg/dL — AB (ref 65–99)
Glucose-Capillary: 280 mg/dL — ABNORMAL HIGH (ref 65–99)

## 2015-11-15 LAB — CBC WITH DIFFERENTIAL/PLATELET
BASOS ABS: 0 10*3/uL (ref 0.0–0.1)
Basophils Relative: 0 %
EOS ABS: 0 10*3/uL (ref 0.0–0.7)
EOS PCT: 0 %
HEMATOCRIT: 31.6 % — AB (ref 36.0–46.0)
Hemoglobin: 9.3 g/dL — ABNORMAL LOW (ref 12.0–15.0)
LYMPHS PCT: 3 %
Lymphs Abs: 0.4 10*3/uL — ABNORMAL LOW (ref 0.7–4.0)
MCH: 27.5 pg (ref 26.0–34.0)
MCHC: 29.4 g/dL — ABNORMAL LOW (ref 30.0–36.0)
MCV: 93.5 fL (ref 78.0–100.0)
MONO ABS: 0.4 10*3/uL (ref 0.1–1.0)
Monocytes Relative: 3 %
Neutro Abs: 16.1 10*3/uL — ABNORMAL HIGH (ref 1.7–7.7)
Neutrophils Relative %: 94 %
Platelets: 274 10*3/uL (ref 150–400)
RBC: 3.38 MIL/uL — ABNORMAL LOW (ref 3.87–5.11)
RDW: 12.8 % (ref 11.5–15.5)
WBC: 17 10*3/uL — AB (ref 4.0–10.5)

## 2015-11-15 LAB — MAGNESIUM: MAGNESIUM: 2.6 mg/dL — AB (ref 1.7–2.4)

## 2015-11-15 LAB — BASIC METABOLIC PANEL
ANION GAP: 8 (ref 5–15)
BUN: 20 mg/dL (ref 6–20)
CALCIUM: 8.2 mg/dL — AB (ref 8.9–10.3)
CO2: 29 mmol/L (ref 22–32)
Chloride: 102 mmol/L (ref 101–111)
Creatinine, Ser: 0.44 mg/dL (ref 0.44–1.00)
GFR calc Af Amer: >60 mL/min (ref >60)
GFR calc non Af Amer: >60 mL/min (ref >60)
GLUCOSE: 236 mg/dL — AB (ref 65–99)
POTASSIUM: 4.9 mmol/L (ref 3.5–5.1)
Sodium: 139 mmol/L (ref 135–145)

## 2015-11-15 LAB — PHOSPHORUS: PHOSPHORUS: 2.1 mg/dL — AB (ref 2.5–4.6)

## 2015-11-15 MED ORDER — ARFORMOTEROL TARTRATE 15 MCG/2ML IN NEBU
15.0000 ug | INHALATION_SOLUTION | Freq: Two times a day (BID) | RESPIRATORY_TRACT | Status: DC
  Administered 2015-11-15 – 2015-11-17 (×4): 15 ug via RESPIRATORY_TRACT
  Filled 2015-11-15 (×4): qty 2

## 2015-11-15 MED ORDER — LEVOFLOXACIN 750 MG PO TABS
750.0000 mg | ORAL_TABLET | Freq: Every day | ORAL | Status: DC
  Administered 2015-11-15 – 2015-11-17 (×3): 750 mg via ORAL
  Filled 2015-11-15 (×3): qty 1

## 2015-11-15 MED ORDER — METHYLPREDNISOLONE SODIUM SUCC 125 MG IJ SOLR
125.0000 mg | Freq: Every day | INTRAMUSCULAR | Status: DC
  Administered 2015-11-16 – 2015-11-17 (×2): 125 mg via INTRAVENOUS
  Filled 2015-11-15 (×2): qty 2

## 2015-11-15 NOTE — Telephone Encounter (Signed)
Home health is calling to let Dr. Tamala Julian know that pt has been admiitted to hospital with respritory distress

## 2015-11-15 NOTE — Progress Notes (Signed)
PULMONARY / CRITICAL CARE MEDICINE   Name: Judith Boyer MRN: [id number] DOB: 1945/06/09    ADMISSION DATE:  11/13/2015 CONSULTATION DATE:  11/13/2015   REFERRING MD:  Dr Wyonia Hough of [facility]  CHIEF COMPLAINT:  AECOPD  HISTORY OF PRESENT ILLNESS:   70 year female in stage COPD with chronic hypoxemic respiratory failure. Follows with Dr. Melvyn Novas in the pulmonary office. History is gained from talking to the family practice primary physician, review of the charts and in talking to the patient and her daughter-in-law. According to the daughter-in-law patient has been in and out of hospice many times. At this point in time she currently is fully functional and she is a categorical DO NOT INTUBATE but otherwise full medical care. Daughter-in-law suspects she is not on hospice anymore but according to the hospice nurse she is currently under hospice care. Patient is being admitted by family practice teaching service to stepdown unit but is still in the emergency department on 11/13/2015 for symptoms consistent with COPD exacerbation requiring BiPAP therapy. Pulmonary critical care has been consulted because of excessive class IV dyspnea despite BiPAP therapy and conventional COPD exacerbation treatment. Patient and daughter-in-law affirm DO NOT INTUBATE status. ABG showed severe acute on chronic respiratory acidosis with pH below 7.2. This slowly improved with BiPAP therapy but even then she is disproportionate class IV dyspnea.  SUBJECTIVE:  Asleep in bed on [place]. Awakens easily, states she is feeling better, breathing easier  VITAL SIGNS: BP (!) 133/52 (BP Location: Left Arm)   Pulse (!) 107   Temp 97.6 F (36.4 C) (Oral)   Resp (!) 26   Ht 5\' 2"  (1.575 m)   Wt 118 lb 13.3 oz (53.9 kg)   SpO2 97%   BMI 21.73 kg/m   HEMODYNAMICS:    VENTILATOR SETTINGS: Vent Mode: BIPAP FiO2 (%):  [40 %] 40 % Set Rate:  [16 bmp] 16 bmp PEEP:  [5 cmH20] 5 cmH20  INTAKE / OUTPUT: I/O last 3 completed  shifts: In: 52 [P.O.:960; I.V.:1050; IV Piggyback:550] Out: 900 [Urine:900]  PHYSICAL EXAMINATION: General:  Frail elderly female, supine in bed wearing nasal cannula Neuro:  Alert and oriented 3. Speaking in full sentences HEENT:. Mm moist , nasal oxygen Cardiovascular:  Sinus tach Lungs:  resps even, mildly labored on [place], diminished throughout,  Barrel chest.  Abdomen:  Scaphoid. No tenderness Musculoskeletal:  No cyanosis no clubbing no edema Skin:  Dry and flaky  LABS: PULMONARY  Recent Labs Lab 11/13/15 0455 11/13/15 0538 11/13/15 1142  PHART 7.173* 7.160* 7.374  PCO2ART 99.6* 99.4* 55.1*  PO2ART 253.0* 44.0* 84.0  HCO3 36.6* 35.4* 32.1*  TCO2 40 38 34  O2SAT 100.0 62.0 96.0    CBC  Recent Labs Lab 11/13/15 0430 11/14/15 0352 11/15/15 0418  HGB 11.5* 9.6* 9.3*  HCT 39.0 32.1* 31.6*  WBC 23.1* 18.8* 17.0*  PLT 357 260 274    COAGULATION No results for input(s): INR in the last 168 hours.  CARDIAC    Recent Labs Lab 11/13/15 0430 11/13/15 1528 11/13/15 2207 11/14/15 0352  TROPONINI 0.18* 0.46* 0.42* 0.43*   No results for input(s): PROBNP in the last 168 hours.   CHEMISTRY  Recent Labs Lab 11/13/15 0430 11/14/15 0352 11/15/15 0418  NA 137 141 139  K 4.5 4.5 4.9  CL 95* 103 102  CO2 31 30 29   GLUCOSE 370* 264* 236*  BUN 14 19 20   CREATININE 0.74 0.56 0.44  CALCIUM 9.0 8.1* 8.2*  MG  --  2.7* 2.6*  PHOS  --  2.2* 2.1*   Estimated Creatinine Clearance: 51 mL/min (by C-G formula based on SCr of 0.44 mg/dL).   LIVER  Recent Labs Lab 11/14/15 0352  AST 22  ALT 24  ALKPHOS 75  BILITOT 0.5  PROT 5.7*  ALBUMIN 2.9*     INFECTIOUS No results for input(s): LATICACIDVEN, PROCALCITON in the last 168 hours.   ENDOCRINE CBG (last 3)   Recent Labs  11/14/15 1701 11/14/15 2112 11/15/15 0751  GLUCAP 333* 209* 280*         IMAGING x48h  - image(s) personally visualized  -   highlighted in bold Portable Chest 1  View  Result Date: 11/14/2015 CLINICAL DATA:  Acute on chronic respiratory failure EXAM: PORTABLE CHEST 1 VIEW COMPARISON:  Yesterday FINDINGS: Chronic cardiopericardial enlargement. Stable aortic and hilar contour. Hyperinflation and interstitial coarsening. No edema or convincing pneumonia. No effusion or pneumothorax. Asymmetric density at the medial left apex is considered osseous based on two-view chest x-ray 06/11/2015. IMPRESSION: 1. COPD without acute superimposed finding. 2. Chronic cardiomegaly. Electronically Signed   By: Monte Fantasia M.D.   On: 11/14/2015 07:52       ASSESSMENT / PLAN:  PULMONARY A: Acute on chronic hypoxemic and hypercapnic respiratory failure with acidosis secondary to COPD exacerbation Possible pneumonia  Significant class IV dyspnea DO NOT INTUBATE status - currently on hospice care but at home functional  P:   Continue q 4 duonebs  Continue BiPAP for dyspnea / respiratory acidosis/ comfort Titrate oxygen for SaO2 90-92% Continue Vanc and Cefepime Follow up on cultures>> pending Continue Solu-Medrol Tolerating morphine to 2-4mg  q4 PRN / ativan DO NOT INTUBATE Continued discussions regarding aligning goals of care with plan of care.   Continue care as  full medical plus current palliation but transition to full palliation with morphine drip if decompensates further.    Updated patient who is alert and oriented 10/10 but no family in room.    Magdalen Spatz, AGACNP-BC [facility] 11/15/2015  9:22 AM Pager: [phone]  Attending Note:  70 year old female with extensive PMH who presents to [facility] with hypoxic and hypercarbic acute on chronic respiratory failure with a  COPD exacerbation and possible PNA.  Has been sent home with hospice before given her end-stage pulmonary disease and now is a DNI only.  Patient decompensates quite easily with any activity.  On exam, patient is off BiPAP, mildly labored and breathsounds  are quite.  I reviewed CXR myself, hyperinflation noted with cardiomegaly.  Confirmed do not intubate, patient asking for morphine for comfort.  Discussed with PCCM-NP.  Acute on chronic respiratory failure:             - BiPAP off and patient appears comfortable.  - Palliative care seeing patient  Hypoxemia:             - Titrate O2 for sat of 88-92%.             - Do not allow to have O2 sats >95%.  GOC:             - DNI             - Need to further address realistic expectations, palliative care working with patient.  HCAP             - Vanc             - Cefepime             -  F/U on cultures.  Little else to be offered from a pulmonary standpoint.  Now that she is on a nasal cannula and night BiPAP recommend home with that and hospice.  PCCM will sign off, please call back if needed  Patient seen and examined, agree with above note.  I dictated the care and orders written for this patient under my direction.  Rush Farmer, MD [phone]

## 2015-11-15 NOTE — Progress Notes (Signed)
Daily Progress Note   Patient Name: Judith Boyer       Date: 11/15/2015 DOB: [birth year]  Age: 70 y.o. MRN#: [id number] Attending Physician: Leeanne Rio, MD Primary Care Physician: No primary care provider on file. Admit Date: 11/13/2015  Reason for Consultation/Follow-up: Establishing goals of care  Subjective: Ms. Dabu is off BiPAP. Seems to be breathing with more ease and able to converse easier. She still feels bad, generally feeling weak and tired. Requiring less prn meds for dyspnea/anxiety. I believe she is making some progress. She would like to return back home if possible.  I did attempt again to clarify her wishes regarding code status. She has been very fickle with her desires when I talk with her and I have been unsuccessful with obtaining an official decision from her. She talks of people she has known that have been intubated and come off but then I remind her of her specific situation and that we doubt that would be possible for her to successfully be extubated. Unfortunately, outside of this hospital DNI does not exist. Either she has golden DNR for home or she is full code. DNI does not make sense for her as she already cannot breath and CPR will not be helpful to her. She has had this discussion many times. She is clearly uncomfortable discussing this subject and she does "not want to die." She becomes frustrated and "tired of talking about this."   Unfortunately this may need to be readdressed by hospice once she, hopefully, returns home. Difficult situation and I believe she is trying to please her sons. She does say "I have one that won't let me go." I think that this is impacting her ability to make a good decision for herself. One son is very supportive of her  desire for more comfort but another wants her to do everything to keep her alive. Sons are frustrated with each other. Spoke with son, [name], who sounds frustrated and says that she tells [place] in the ED she does not want resuscitation and wants to just be comfortable until his brother comes.   Length of Stay: 2  Current Medications: Scheduled Meds:  . enoxaparin (LOVENOX) injection  40 mg Subcutaneous Q24H  . insulin aspart  0-5 Units Subcutaneous QHS  . insulin aspart  0-9 Units Subcutaneous  TID WC  . ipratropium-albuterol  3 mL Nebulization Q4H  . levofloxacin  750 mg Oral Daily  . [START ON 11/16/2015] methylPREDNISolone (SOLU-MEDROL) injection  125 mg Intravenous Daily  . sodium chloride flush  3 mL Intravenous Q12H  . sodium chloride flush  3 mL Intravenous Q12H    Continuous Infusions:    PRN Meds: sodium chloride, acetaminophen, LORazepam, morphine injection, ondansetron (ZOFRAN) IV, oxyCODONE, sodium chloride flush  Physical Exam  Constitutional: She is oriented to person, place, and time. She appears well-developed and well-nourished.  HENT:  Head: Normocephalic and atraumatic.  Cardiovascular: Tachycardia present.   Pulmonary/Chest: No accessory muscle usage. No tachypnea. She is in respiratory distress.  Distress more mild, able to converse today with more ease  Abdominal: Normal appearance.  Neurological: She is alert and oriented to person, place, and time.  Nursing note and vitals reviewed.           Vital Signs: BP (!) 122/53 (BP Location: Left Arm)   Pulse (!) 114   Temp 97 F (36.1 C) (Oral)   Resp (!) 24   Ht 5\' 2"  (1.575 m)   Wt 53.9 kg (118 lb 13.3 oz)   SpO2 97%   BMI 21.73 kg/m  SpO2: SpO2: 97 % O2 Device: O2 Device: Nasal Cannula O2 Flow Rate: O2 Flow Rate (L/min): 6 L/min  Intake/output summary:  Intake/Output Summary (Last 24 hours) at 11/15/15 1749 Last data filed at 11/15/15 1300  Gross per 24 hour  Intake              350 ml  Output               350 ml  Net                0 ml   LBM: Last BM Date: 11/14/15 Baseline Weight: Weight: 53.9 kg (118 lb 13.3 oz) Most recent weight: Weight: 53.9 kg (118 lb 13.3 oz)       Palliative Assessment/Data:      Patient Active Problem List   Diagnosis Date Noted  . Acute and chronic respiratory failure with hypercapnia ([place]) 11/13/2015  . Acute on chronic respiratory failure ([place]) 11/13/2015  . Elevated troponin I level   . Goals of care, counseling/discussion   . CAP (community acquired pneumonia) 06/10/2015  . CAD (coronary artery disease) 06/10/2015  . Chronic constipation 06/10/2015  . Encounter for palliative care   . End stage COPD ([place])   . Hospice care patient   . Type 2 diabetes mellitus with complication, with long-term current use of insulin ([place])   . Hypoglycemia   . Hypokalemia   . SOB (shortness of breath)   . Acute respiratory failure ([place]) 01/30/2015  . HCAP (healthcare-associated pneumonia) 01/30/2015  . DNR (do not resuscitate) discussion 12/29/2014  . Acute on chronic respiratory failure with hypoxia and hypercapnia (HCC)   . Hyperkalemia 12/25/2014  . Tobacco abuse   . Noncompliance   . Acute on chronic respiratory failure with hypoxia ([place])   . Acute exacerbation of chronic obstructive pulmonary disease (COPD) ([place])   . Diabetes mellitus with complication ([place])   . Chronic respiratory failure with hypercapnia ([place]) 09/24/2014  . Palliative care by specialist   . Dysphagia   . Dyspnea 08/13/2014  . Tachycardia   . Respiratory acidosis   . Coronary artery disease involving native coronary artery of native heart without angina pectoris   . Anxiety   . B12 deficiency 02/10/2014  . Protein-calorie  malnutrition, severe ([place]) 02/09/2014  . Acute respiratory distress (HCC) 02/08/2014  . Vitamin B 12 deficiency 06/28/2013  . Palliative care encounter 02/25/2013  . Weakness generalized 02/25/2013  . COPD exacerbation ([place]) 02/20/2013  . Anemia  02/20/2013  . Nocturnal hypoxemia 12/30/2012  . COPD GOLD III 02/06/2011  . Smoker 02/06/2011  . Hypertension 02/06/2011  . Diabetes mellitus type 2, controlled, without complications ([place]) [id number]    Palliative Care Assessment & Plan   HPI: 70 y.o. female  with past medical history of end stage COPD, CAD s/p stent, hypertension, diabetes mellitus 2 well known to palliative team admitted on 11/13/2015 with acute on chronic respiratory failure. Currently being followed by [facility] who she had assisting her at home - per their record she was DNR at home and she has been to [name] x 2. Chronic severe anxiety and SOB. Per records she has 2 sons who disagree on her care plan (i.e. DNR, hospice). Seems she has compromised with DNI at current time.    Assessment: Lying in bed. Less distress, breathing seems improved today.   Recommendations/Plan:  Anxiety: Ativan 1 mg po every 4 hours prn.   SOB: OxyIR 5 mg every 4 hours prn. Morphine 2 mg IV every 6 hours prn.   Medications adjusted to mirror home meds as requested by patient and family per RN. PO meds may provide longer relief with less severe lethargy hopefully.   Consider restarting home fluoxetine and other home meds she is currently taking as she is tolerating off BiPAP for 30-60 min at a time to eat and take breaks.   Goals of Care and Additional Recommendations:  Limitations on Scope of Treatment: Avoid Hospitalization  Code Status:  Limited code no intubation  Prognosis:   < 6 months  Discharge Planning:  Home with Hospice   Thank you for allowing the Palliative Medicine Team to assist in the care of this patient.   Time In: 1610 Time Out: 1635 Total Time 86min Prolonged Time Billed  no       Greater than 50%  of this time was spent counseling and coordinating care related to the above assessment and plan.  [name], NP Palliative Medicine Team Pager # [phone] (M-F 8a-5p) Team Phone # [phone]  (Nights/Weekends)

## 2015-11-15 NOTE — Progress Notes (Addendum)
[place] [facility] RN note.  This is a GIP related and covered admission of 11/13/15, per Dr. Karie Georges.  Patient has an [facility] diagnosis of COPD.  Code status at this time is a limited code:  No intubation.   Transfer summary and [facility] medication list placed on shadow chart.    Visited patient at bedside.  There are no family members/visitors present.    Patient is sleeping upon my arrival, but arouses easily.  She states she does not know how she is doing.  She denies pain.  Oxygen saturation currently 97% on 6 liters.  Patient reports she had to wear bi-pap all night.  Patient has eaten some of her clear, liquid breakfast.    Patient states she doesn't feel well, but doesn't offer an explanation.  This Probation officer explained to her that she is still receiving iv antibiotics, and her oxygen saturation is good at this time.  This does not seem to relieve patient.  I talked with her briefly about her discussion with Palliative Medicine Team.    [facility] RN will continue to follow daily, and assist as needed.  Efrain Sella, RN, BSN  [phone]  [phone] addendum:  Spoke with patient's son, Hart Carwin.  He states patient was looking much better, when he saw her in the hospital.  He states patient was going to the grocery store prior to this hospitalization, and "I am not ready to put her out to pasture yet."  He states he plans to take patient home, when she is ready for discharge.  Hart Carwin explains he will be in to see patient after 4 pm today.  This writer explained [facility] will see him this afternoon.  Efrain Sella, RN, BSN

## 2015-11-15 NOTE — Progress Notes (Signed)
[facility] [place] Work note Patient was resting, yet awakened to request BiPap be taken off. She was able to sit up and talk briefly. She shared that she was feeling bad and stated, "I don't know what they are going to do with me".  Patient reports no pain, only shortness of breath. LCSW offered active, reflective listening and encouraged limited talking. She was able to eat jello, drink broth, cranberry juice. Patient reclined to take a nap. LCSW spoke with RN regarding care, plans.  Patient has been admitted to [facility] 2x in the past and returned home as she stabilized and wanted to return home. It appears from chart notes that son, Antony Madura wants patient to return home upon discharge. No family present to consult. Katherina Right, [place]

## 2015-11-15 NOTE — Progress Notes (Signed)
Responded to spiritual Care consult to support patient. I spoke with patients' nurse prior to visit. Patient nurse indicate that patient is still torn about [place] and does not want to be intubated.  Patient is alert and said she did not ask for a chaplain. I offered our services. Patient said she didn't feel like talking right now and when she ready she would let nurse know and have [place] paged.. I will pass on to unit Chaplain for follow up on tomorrow.    11/15/15 1100  Clinical Encounter Type  Visited With Patient;Health care provider  Visit Type Initial;Spiritual support  Referral From Other (Comment) (spiritual Consult)  Jaclynn Major, [place] , [place]

## 2015-11-15 NOTE — Progress Notes (Signed)
Family Medicine Teaching Service Daily Progress Note Intern Pager: [phone]  Patient name: Judith Boyer Medical record number: [id number] Date of birth: 1945-07-23 Age: 70 y.o. Gender: female  Primary Care Provider: No primary care provider on file. Consultants: CCM, Palliative Care Code Status: DNI  Pt Overview and Major Events to Date:  Admit 10/8 Start Cefepime (10/8-10/10) Start Vancomycin (10/9-10/10)  Assessment and Plan: Judith Boyer is a 70 y.o. female presenting with acute on chronic respiratory failure . PMH is significant for End stage COPD, CAD w/ stent, HTN, DM2, protein calorie malnutrition.  # Acute on chronic respiratory failure:  Secondary to COPD exacerbation vs pneumonia vs ischemia.    CXR with bibasilar opacities concerning for pna vs atelectasis.  Treating as HCAP given recent history of hospice care.  Repeat CXR 10/9 chronic cardiopericardial enlargement, hyperinflation and interstitial coarsening. No edema or convincing pneumonia. No effusion or pneumothorax . pH 7.1 on admission, repeat ABG with pH 7.3. Trend trops 0.18>0.46>0.42>0.43  though suspect this is demand ischemia.  This AM, patient appears anxious and is having increased work of breathing. Conversation is difficult for her because of dyspnea .  - Monitor closely in SDU - Continue Bipap: Patient is on and off it intermittently - Give 125 Solumedrol daily - D/C Vanc and Cefepime -transition to po abx: Levaquin 750 mg x 7 days - c/s CCM: Albuterol q2, Atrovent q4. Appreciate recs.   - f/u Blood cx pending -Ucx: <10,000 colonies insignificant growth  -CCM signed off 10/10  # CAD w/ stent in 2009/ HTN.  BP 108/38.  Echo 2015: EF 0000000, normal systolic fnx. - Hold any home PO meds for now - No BB listed on previous med list.  Consider adding Metoprolol.  # DM2: A1c 7.2 (01/2015).  - sSSI while hospitalized - CBG QAC and HS - A1c with am labs   # Undetermined GOC: Patient recently  discharged from [facility] place per son, Judith Boyer.  Upon reviewed chart, it appears patient's PCP was receiving calls from [facility]) in mid September.   Patient has not POA. Family meeting to discuss goals of care yesterday.  Patient does not wish to be intubated however would like all other measures to be taken.  -Palliative consulted 10/9 appreciate recs--> GOC unclear, patient changes her mind often, currently followed by hospice. Limited code.  -Per palliative--> Anxiety: Ativan 1 mg po every 4 hours prn. SOB: OxyIR 5 mg every 4 hours prn. Morphine 2 mg IV every 6 hours prn. Medications adjusted to mirror home meds as requested by patient and family per RN. PO meds may provide longer relief with less severe lethargy hopefully.  -Consider restarting home fluoxetine and other home meds she is currently taking as she is tolerating off BiPAP for 30-60 min at a time to eat and take breaks -Chaplain services requested to see , per palliative recs - DNI placed in chart - Judith Boyer (son) contact [phone]  FEN/GI: clear liquid, IV NS @kvo  Prophylaxis: Lovenox sub-q  Disposition: Continue to monitor in SDU  Subjective:  Patient off Bipap this AM and states she feels a bit better than yesterday. States she feels confused over the decisions she has to make.  With no other complaints at current time.  Objective: Temp:  [97 F (36.1 C)-98.1 F (36.7 C)] 97 F (36.1 C) (10/10 1420) Pulse Rate:  [88-114] 114 (10/10 1420) Resp:  [16-26] 24 (10/10 1420) BP: (101-133)/(45-62) 122/53 (10/10 1420) SpO2:  [96 %-100 %] 97 % (10/10  1504) FiO2 (%):  [40 %] 40 % (10/10 1229) Physical Exam: General: tired appearing elderly female, nasal cannula in place  Eyes: EOMI ENTM: MMM, oropharynx clear Neck: supple Cardiovascular: tachycardic, did not appreciate any murmurs Respiratory: poor air movement, no discrete wheeze/ rhonchi/ rales, dyspneic with speech and slight work of breathing  Gastrointestinal: soft,  obese, NT/ND, +BS MSK: cool LE, no edema Derm: skin dry and intact Neuro: follows commands, awake and alert, responds to questioning appropriately Psych: mood stable, pleasant  Laboratory:  Recent Labs Lab 11/13/15 0430 11/14/15 0352 11/15/15 0418  WBC 23.1* 18.8* 17.0*  HGB 11.5* 9.6* 9.3*  HCT 39.0 32.1* 31.6*  PLT 357 260 274    Recent Labs Lab 11/13/15 0430 11/14/15 0352 11/15/15 0418  NA 137 141 139  K 4.5 4.5 4.9  CL 95* 103 102  CO2 31 30 29   BUN 14 19 20   CREATININE 0.74 0.56 0.44  CALCIUM 9.0 8.1* 8.2*  PROT  --  5.7*  --   BILITOT  --  0.5  --   ALKPHOS  --  75  --   ALT  --  24  --   AST  --  22  --   GLUCOSE 370* 264* 236*   Imaging/Diagnostic Tests:  Portable Chest 1 View  Result Date: 11/14/2015 CLINICAL DATA:  Acute on chronic respiratory failure EXAM: PORTABLE CHEST 1 VIEW COMPARISON:  Yesterday FINDINGS: Chronic cardiopericardial enlargement. Stable aortic and hilar contour. Hyperinflation and interstitial coarsening. No edema or convincing pneumonia. No effusion or pneumothorax. Asymmetric density at the medial left apex is considered osseous based on two-view chest x-ray 06/11/2015. IMPRESSION: 1. COPD without acute superimposed finding. 2. Chronic cardiomegaly. Electronically Signed   By: Monte Fantasia M.D.   On: 11/14/2015 07:52   Dg Chest Port 1 View  Result Date: 11/13/2015 CLINICAL DATA:  Acute onset of dyspnea.  Initial encounter. EXAM: PORTABLE CHEST 1 VIEW COMPARISON:  Chest radiograph performed 06/11/2015 FINDINGS: The lungs are well-aerated. Mild bibasilar opacities may reflect atelectasis or possibly mild pneumonia. There is no evidence of pleural effusion or pneumothorax. The cardiomediastinal silhouette is borderline enlarged. No acute osseous abnormalities are seen. IMPRESSION: Mild bibasilar opacities may reflect atelectasis or possibly mild pneumonia. Borderline cardiomegaly. Electronically Signed   By: Garald Balding M.D.   On:  11/13/2015 05:05   [name], MD 11/15/2015, 5:40 PM PGY-1, [place] Intern pager: [phone], text pages welcome

## 2015-11-15 NOTE — Care Management Note (Signed)
Case Management Note  Patient Details  Name: Judith Boyer MRN: [id number] Date of Birth: April 06, 1945  Subjective/Objective:   Presents with copd ex, requiring bipap.   Patient is from home , she is active with [facility] .  Per previous note , son Hart Carwin plans for her to go back home at discharge.  NCM will cont to follow for dc needs.                 Action/Plan:   Expected Discharge Date:                  Expected Discharge Plan:  Home w Hospice Care  In-House Referral:     Discharge planning Services  CM Consult  Post Acute Care Choice:  Hospice, Resumption of Svcs/PTA Provider Choice offered to:     DME Arranged:    DME Agency:     HH Arranged:    HH Agency:     Status of Service:  In process, will continue to follow  If discussed at Long Length of Stay Meetings, dates discussed:    Additional Comments:  Zenon Mayo, RN 11/15/2015, 3:55 PM

## 2015-11-16 ENCOUNTER — Inpatient Hospital Stay (HOSPITAL_COMMUNITY)

## 2015-11-16 LAB — BASIC METABOLIC PANEL
ANION GAP: 7 (ref 5–15)
BUN: 14 mg/dL (ref 6–20)
CHLORIDE: 101 mmol/L (ref 101–111)
CO2: 32 mmol/L (ref 22–32)
Calcium: 8.3 mg/dL — ABNORMAL LOW (ref 8.9–10.3)
Creatinine, Ser: 0.47 mg/dL (ref 0.44–1.00)
GFR calc non Af Amer: >60 mL/min (ref >60)
GLUCOSE: 145 mg/dL — AB (ref 65–99)
POTASSIUM: 4.2 mmol/L (ref 3.5–5.1)
Sodium: 140 mmol/L (ref 135–145)

## 2015-11-16 LAB — CBC WITH DIFFERENTIAL/PLATELET
BASOS ABS: 0 10*3/uL (ref 0.0–0.1)
BASOS PCT: 0 %
Eosinophils Absolute: 0 10*3/uL (ref 0.0–0.7)
Eosinophils Relative: 0 %
HEMATOCRIT: 30.2 % — AB (ref 36.0–46.0)
Hemoglobin: 9.2 g/dL — ABNORMAL LOW (ref 12.0–15.0)
LYMPHS PCT: 6 %
Lymphs Abs: 1 10*3/uL (ref 0.7–4.0)
MCH: 27.6 pg (ref 26.0–34.0)
MCHC: 30.5 g/dL (ref 30.0–36.0)
MCV: 90.7 fL (ref 78.0–100.0)
MONO ABS: 1.4 10*3/uL — AB (ref 0.1–1.0)
Monocytes Relative: 8 %
NEUTROS ABS: 15.2 10*3/uL — AB (ref 1.7–7.7)
NEUTROS PCT: 86 %
Platelets: 280 10*3/uL (ref 150–400)
RBC: 3.33 MIL/uL — ABNORMAL LOW (ref 3.87–5.11)
RDW: 12.9 % (ref 11.5–15.5)
WBC: 17.6 10*3/uL — ABNORMAL HIGH (ref 4.0–10.5)

## 2015-11-16 LAB — GLUCOSE, CAPILLARY
GLUCOSE-CAPILLARY: 208 mg/dL — AB (ref 65–99)
GLUCOSE-CAPILLARY: 341 mg/dL — AB (ref 65–99)
GLUCOSE-CAPILLARY: 349 mg/dL — AB (ref 65–99)
Glucose-Capillary: 142 mg/dL — ABNORMAL HIGH (ref 65–99)

## 2015-11-16 LAB — MAGNESIUM: MAGNESIUM: 2.5 mg/dL — AB (ref 1.7–2.4)

## 2015-11-16 LAB — PHOSPHORUS: PHOSPHORUS: 2.2 mg/dL — AB (ref 2.5–4.6)

## 2015-11-16 NOTE — Progress Notes (Signed)
Family Medicine Teaching Service Daily Progress Note Intern Pager: [phone]  Patient name: Judith Boyer Medical record number: [id number] Date of birth: [birth year] Age: 70 y.o. Gender: female  Primary Care Provider: No primary care provider on file. Consultants: CCM, Palliative Care Code Status: DNI  Pt Overview and Major Events to Date:  Admit 10/8 Start Cefepime (10/8-10/10) Start Vancomycin (10/9-10/10)  Assessment and Plan: Judith Boyer is a 70 y.o. female presenting with acute on chronic respiratory failure . PMH is significant for End stage COPD, CAD w/ stent, HTN, DM2, protein calorie malnutrition.  # Acute on chronic respiratory failure:  Treating as HCAP given recent history of hospice care.  Repeat CXR 10/9 chronic cardiopericardial enlargement, hyperinflation and interstitial coarsening. No edema or convincing pneumonia. No effusion or pneumothorax . Has remained afebrile. WBC down-trending. Blood cx NG. This AM, patient appears anxious and is having increased work of breathing. Conversation is difficult for her secondary to dyspnea .  Discussed scheduling morphine however patient stated does not help much with breathing and makes her sleepy. States that she has some difficulty making decisions right now about what her wishes are.  CCM signed off 10/10. - Monitor closely in SDU - Continue Bipap: Patient is on and off it intermittently. Off Bipap overnight and on 6L O2.  - Continue 125 Solumedrol daily - Levaquin Day 2/7 750 mg -Added home [place] 10/10 -Continue scheduled duonebs  # CAD w/ stent in 2009/ HTN.  BP 108/38.  Echo 2015: EF 0000000, normal systolic fnx. - Hold any home PO meds for now - No BB listed on previous med list.  Consider adding Metoprolol.  # DM2: A1c 7.2 (01/2015).  - sSSI while hospitalized - CBG QAC and HS - A1c with am labs   # Undetermined GOC: Patient recently discharged from [facility] place per son, [name].  Was receiving home  hospice.   Patient does not wish to be intubated however would like all other measures to be taken.  -Palliative consulted 10/9--> GOC unclear, patient changes her mind often, currently followed by hospice. Limited code.  -Per palliative, patient receiving --> Anxiety: Ativan 1 mg po every 4 hours prn. SOB: OxyIR 5 mg every 4 hours prn. Morphine 2 mg IV every 6 hours prn.   -Chaplain services 10/10 - patient feels torn about GOC and does not wish to be intubated.  Did not feel like talking to anyone at the time. Appreciate assistance. - DNI placed in chart  - [name] (son) contact [phone] -Will follow up with CM/SW to make sure home hospice is set up for her.   FEN/GI: clear liquid, IV NS @kvo  Prophylaxis: Lovenox sub-q  Disposition: Continue to monitor in SDU  Subjective:  Patient states she was off Bipap since 9 pm last night.  Has been on 6L O2 .  Is having a lot of difficulty speaking in full sentences, states she feels confused about the decisions she has been asked to make per palliative consult.  No other complaints at this time.    Objective: Temp:  [97 F (36.1 C)-98.5 F (36.9 C)] 97.9 F (36.6 C) (10/11 0352) Pulse Rate:  [105-115] 109 (10/11 0352) Resp:  [18-26] 18 (10/11 0352) BP: (120-147)/(47-63) 128/60 (10/11 0352) SpO2:  [96 %-100 %] 97 % (10/11 0352) FiO2 (%):  [40 %] 40 % (10/10 1944) Physical Exam: General: tired and frail appearing elderly female, nasal cannula in place  Eyes: EOMI ENTM: MMM, oropharynx clear Neck: supple Cardiovascular: tachycardic, did not  appreciate any murmurs, rubs, gallops Respiratory: poor air movement, no discrete wheeze/ rhonchi/ rales, dyspneic with speech and slight work of breathing  Gastrointestinal: soft, obese, NT/ND, +BS MSK: cool LE, no edema Derm: skin dry and intact Neuro: AAOx3, awake, responds to questioning appropriately Psych: mood stable, pleasant  Laboratory:  Recent Labs Lab 11/14/15 0352 11/15/15 0418  11/16/15 0345  WBC 18.8* 17.0* 17.6*  HGB 9.6* 9.3* 9.2*  HCT 32.1* 31.6* 30.2*  PLT 260 274 280    Recent Labs Lab 11/14/15 0352 11/15/15 0418 11/16/15 0345  NA 141 139 140  K 4.5 4.9 4.2  CL 103 102 101  CO2 30 29 32  BUN 19 20 14   CREATININE 0.56 0.44 0.47  CALCIUM 8.1* 8.2* 8.3*  PROT 5.7*  --   --   BILITOT 0.5  --   --   ALKPHOS 75  --   --   ALT 24  --   --   AST 22  --   --   GLUCOSE 264* 236* 145*   Imaging/Diagnostic Tests:  Portable Chest 1 View  Result Date: 11/14/2015 CLINICAL DATA:  Acute on chronic respiratory failure EXAM: PORTABLE CHEST 1 VIEW COMPARISON:  Yesterday FINDINGS: Chronic cardiopericardial enlargement. Stable aortic and hilar contour. Hyperinflation and interstitial coarsening. No edema or convincing pneumonia. No effusion or pneumothorax. Asymmetric density at the medial left apex is considered osseous based on two-view chest x-ray 06/11/2015. IMPRESSION: 1. COPD without acute superimposed finding. 2. Chronic cardiomegaly. Electronically Signed   By: Monte Fantasia M.D.   On: 11/14/2015 07:52   Dg Chest Port 1 View  Result Date: 11/13/2015 CLINICAL DATA:  Acute onset of dyspnea.  Initial encounter. EXAM: PORTABLE CHEST 1 VIEW COMPARISON:  Chest radiograph performed 06/11/2015 FINDINGS: The lungs are well-aerated. Mild bibasilar opacities may reflect atelectasis or possibly mild pneumonia. There is no evidence of pleural effusion or pneumothorax. The cardiomediastinal silhouette is borderline enlarged. No acute osseous abnormalities are seen. IMPRESSION: Mild bibasilar opacities may reflect atelectasis or possibly mild pneumonia. Borderline cardiomegaly. Electronically Signed   By: Garald Balding M.D.   On: 11/13/2015 05:05   [name], MD 11/16/2015, 7:19 AM PGY-1, [place] Intern pager: [phone], text pages welcome

## 2015-11-16 NOTE — Progress Notes (Signed)
RT asked patient if she would like to wear her BIPAP HS tonight. Patient stated she doesn't need it right now and she would let me know if she wants to go on it.

## 2015-11-16 NOTE — Progress Notes (Signed)
[place] and [facility] RN note. This is a GIP related and covered admission of 11/13/15, per Dr. Karie Georges. Patient has an [facility] diagnosis of COPD. Code status at this time is a limited code: No intubation.   Transfer summary and [facility] medication list placed on shadow chart.   Visited patient at bedside. There are no family members/visitors present.   Patient is sleeping upon my arrival, but arouses easily.  Patient states she is not feeling well.  This Probation officer asked for clarification, and she states "in every way."    Patient reports she may need her bi-pap on soon.   Per chart review, patient has received Ativan 1 mg po since midnight.  Patient has received  Oxycodone 5 mg X 2 since midnight.    [facility] RN will continue to follow daily, and assist as needed.  Efrain Sella, RN, BSN   [phone]

## 2015-11-16 NOTE — Consult Note (Signed)
   [facility] CM Inpatient Consult   11/16/2015  Judith Boyer 05-18-1945 [id number]   Patient screened for potential [facility] services for COPD protocol and 2nd admission in 6 months.   Chart review reveals and per Palliative Care notes [name], NP) notes 'this is a 70 y.o. female  with past medical history of end stage COPD, CAD s/p stent, hypertension, diabetes mellitus 2 well known to palliative team admitted on 11/13/2015 with acute on chronic respiratory failure. Currently being followed by [name] who she had assisting her at home - per their record she was DNR at home and she has been to [name] x 2. Chronic severe anxiety and SOB. Per records she has 2 sons who disagree on her care plan (i.e. DNR, hospice).'  Patient is eligible for [facility] Care Management services under patient's Medicare plan.  Patient is now under [place]. No [facility] follow up needs as she will have Hospice benefits.  For questions contact:   Natividad Brood, RN BSN [facility] Liaison  [phone] business mobile phone Toll free office [phone]

## 2015-11-16 NOTE — Telephone Encounter (Signed)
Ed admission is in epic.

## 2015-11-16 NOTE — Progress Notes (Signed)
Daily Progress Note   Patient Name: Judith Boyer       Date: 11/16/2015 DOB: Jun 09, 1945  Age: 70 y.o. MRN#: [id number] Attending Physician: Leeanne Rio, MD Primary Care Physician: No primary care provider on file. Admit Date: 11/13/2015  Reason for Consultation/Follow-up: Establishing goals of care  Subjective: I met again with [name]. She is sitting at the side of her bed trying to eat her lunch. She does not have much of an appetite. Still feels "bad" but breathing seems a little more improved each day. She complains about not being able to work the television. We did not discuss code status today. Emotional support provided.   Length of Stay: 3  Current Medications: Scheduled Meds:  . arformoterol  15 mcg Nebulization BID  . enoxaparin (LOVENOX) injection  40 mg Subcutaneous Q24H  . insulin aspart  0-5 Units Subcutaneous QHS  . insulin aspart  0-9 Units Subcutaneous TID WC  . ipratropium-albuterol  3 mL Nebulization Q4H  . levofloxacin  750 mg Oral Daily  . methylPREDNISolone (SOLU-MEDROL) injection  125 mg Intravenous Daily  . sodium chloride flush  3 mL Intravenous Q12H  . sodium chloride flush  3 mL Intravenous Q12H    Continuous Infusions:    PRN Meds: sodium chloride, acetaminophen, LORazepam, morphine injection, ondansetron (ZOFRAN) IV, oxyCODONE, sodium chloride flush  Physical Exam  Constitutional: She is oriented to person, place, and time. She appears well-developed and well-nourished.  HENT:  Head: Normocephalic and atraumatic.  Cardiovascular: Tachycardia present.   Pulmonary/Chest: No accessory muscle usage. No tachypnea. She is in respiratory distress.  Distress more mild, able to converse today with more ease.   Abdominal: Normal  appearance.  Neurological: She is alert and oriented to person, place, and time.  Nursing note and vitals reviewed.           Vital Signs: BP 126/86 (BP Location: Left Arm)   Pulse (!) 107   Temp 98.3 F (36.8 C) (Oral)   Resp 20   Ht _0  (1.575 m)   Wt 53.9 kg (118 lb 13.3 oz)   SpO2 100%   BMI 21.73 kg/m  SpO2: SpO2: 100 % O2 Device: O2 Device: Nasal Cannula O2 Flow Rate: O2 Flow Rate (L/min): 6 L/min  Intake/output summary:   Intake/Output Summary (Last 24 hours)  at 11/16/15 1451 Last data filed at 11/16/15 1400  Gross per 24 hour  Intake              610 ml  Output              175 ml  Net              435 ml   LBM: Last BM Date: 11/16/15 Baseline Weight: Weight: 53.9 kg (118 lb 13.3 oz) Most recent weight: Weight: 53.9 kg (118 lb 13.3 oz)       Palliative Assessment/Data:    Flowsheet Rows   Flowsheet Row Most Recent Value  Intake Tab  Referral Department  -- [family medicine]  Unit at Time of Referral  ER  Palliative Care Primary Diagnosis  Pulmonary  Date Notified  11/13/15  Palliative Care Type  Return patient Palliative Care  Reason for referral  Clarify Goals of Care  Date of Admission  11/13/15  Date first seen by Palliative Care  11/14/15  # of days Palliative referral response time  1 Day(s)  # of days IP prior to Palliative referral  0  Clinical Assessment  Psychosocial & Spiritual Assessment  Palliative Care Outcomes      Patient Active Problem List   Diagnosis Date Noted  . Acute and chronic respiratory failure with hypercapnia ([place]) 11/13/2015  . Acute on chronic respiratory failure ([place]) 11/13/2015  . Elevated troponin I level   . Goals of care, counseling/discussion   . CAP (community acquired pneumonia) 06/10/2015  . CAD (coronary artery disease) 06/10/2015  . Chronic constipation 06/10/2015  . Encounter for palliative care   . End stage COPD ([place])   . Hospice care patient   . Type 2 diabetes mellitus with complication, with  long-term current use of insulin ([place])   . Hypoglycemia   . Hypokalemia   . SOB (shortness of breath)   . Acute respiratory failure ([place]) 01/30/2015  . HCAP (healthcare-associated pneumonia) 01/30/2015  . DNR (do not resuscitate) discussion 12/29/2014  . Acute on chronic respiratory failure with hypoxia and hypercapnia (HCC)   . Hyperkalemia 12/25/2014  . Tobacco abuse   . Noncompliance   . Acute on chronic respiratory failure with hypoxia ([place])   . Acute exacerbation of chronic obstructive pulmonary disease (COPD) ([place])   . Diabetes mellitus with complication ([place])   . Chronic respiratory failure with hypercapnia ([place]) 09/24/2014  . Palliative care by specialist   . Dysphagia   . Dyspnea 08/13/2014  . Tachycardia   . Respiratory acidosis   . Coronary artery disease involving native coronary artery of native heart without angina pectoris   . Anxiety   . B12 deficiency 02/10/2014  . Protein-calorie malnutrition, severe ([place]) 02/09/2014  . Acute respiratory distress (HCC) 02/08/2014  . Vitamin B 12 deficiency 06/28/2013  . Palliative care encounter 02/25/2013  . Weakness generalized 02/25/2013  . COPD exacerbation ([place]) 02/20/2013  . Anemia 02/20/2013  . Nocturnal hypoxemia 12/30/2012  . COPD GOLD III 02/06/2011  . Smoker 02/06/2011  . Hypertension 02/06/2011  . Diabetes mellitus type 2, controlled, without complications ([place]) 38/75/6433    Palliative Care Assessment & Plan   HPI: 70 y.o. female  with past medical history of end stage COPD, CAD s/p stent, hypertension, diabetes mellitus 2 well known to palliative team admitted on 11/13/2015 with acute on chronic respiratory failure. Currently being followed by [facility] who she had assisting her at home - per their record she was DNR at home  and she has been to [name] x 2. Chronic severe anxiety and SOB. Per records she has 2 sons who disagree on her care plan (i.e. DNR, hospice). Seems she has compromised with DNI at current  time.    Assessment: Lying in bed. Less distress, breathing seems improved today.   Recommendations/Plan:  Anxiety: Ativan 1 mg po every 4 hours prn.   SOB: OxyIR 5 mg every 4 hours prn. Morphine 2 mg IV every 6 hours prn.   Medications adjusted to mirror home meds as requested by patient and family per RN. PO meds may provide longer relief with less severe lethargy hopefully.   Consider restarting home fluoxetine and other home meds she is currently taking as she is tolerating off BiPAP for 30-60 min at a time to eat and take breaks.   Goals of Care and Additional Recommendations:  Limitations on Scope of Treatment: Avoid Hospitalization  Code Status:  Limited code no intubation  Prognosis:   < 6 months  Discharge Planning:  Home with Hospice   Thank you for allowing the Palliative Medicine Team to assist in the care of this patient.   Time In: 1415 Time Out: 1430 Total Time 27mn Prolonged Time Billed  no       Greater than 50%  of this time was spent counseling and coordinating care related to the above assessment and plan.  AVinie Sill NP Palliative Medicine Team Pager # [phone](M-F 8a-5p) Team Phone # [phone](Nights/Weekends)

## 2015-11-17 LAB — CBC WITH DIFFERENTIAL/PLATELET
BASOS ABS: 0 10*3/uL (ref 0.0–0.1)
Basophils Relative: 0 %
Eosinophils Absolute: 0 10*3/uL (ref 0.0–0.7)
Eosinophils Relative: 0 %
HEMATOCRIT: 31.3 % — AB (ref 36.0–46.0)
HEMOGLOBIN: 9.5 g/dL — AB (ref 12.0–15.0)
LYMPHS PCT: 14 %
Lymphs Abs: 1.6 10*3/uL (ref 0.7–4.0)
MCH: 27.5 pg (ref 26.0–34.0)
MCHC: 30.4 g/dL (ref 30.0–36.0)
MCV: 90.5 fL (ref 78.0–100.0)
MONO ABS: 1.2 10*3/uL — AB (ref 0.1–1.0)
MONOS PCT: 10 %
NEUTROS ABS: 8.5 10*3/uL — AB (ref 1.7–7.7)
NEUTROS PCT: 76 %
Platelets: 278 10*3/uL (ref 150–400)
RBC: 3.46 MIL/uL — ABNORMAL LOW (ref 3.87–5.11)
RDW: 12.7 % (ref 11.5–15.5)
WBC: 11.4 10*3/uL — ABNORMAL HIGH (ref 4.0–10.5)

## 2015-11-17 LAB — GLUCOSE, CAPILLARY
Glucose-Capillary: 123 mg/dL — ABNORMAL HIGH (ref 65–99)
Glucose-Capillary: 187 mg/dL — ABNORMAL HIGH (ref 65–99)

## 2015-11-17 LAB — BASIC METABOLIC PANEL
ANION GAP: 7 (ref 5–15)
BUN: 12 mg/dL (ref 6–20)
CALCIUM: 8.4 mg/dL — AB (ref 8.9–10.3)
CHLORIDE: 98 mmol/L — AB (ref 101–111)
CO2: 37 mmol/L — AB (ref 22–32)
Creatinine, Ser: 0.48 mg/dL (ref 0.44–1.00)
GFR calc Af Amer: >60 mL/min (ref >60)
GFR calc non Af Amer: >60 mL/min (ref >60)
GLUCOSE: 132 mg/dL — AB (ref 65–99)
POTASSIUM: 3.7 mmol/L (ref 3.5–5.1)
Sodium: 142 mmol/L (ref 135–145)

## 2015-11-17 LAB — PHOSPHORUS: PHOSPHORUS: 2.1 mg/dL — AB (ref 2.5–4.6)

## 2015-11-17 LAB — MAGNESIUM: Magnesium: 2.1 mg/dL (ref 1.7–2.4)

## 2015-11-17 MED ORDER — PREDNISONE 20 MG PO TABS: ORAL_TABLET | ORAL | 0 refills | Status: DC

## 2015-11-17 MED ORDER — LEVOFLOXACIN 750 MG PO TABS: 750.0000 mg | ORAL_TABLET | Freq: Every day | ORAL | 0 refills | Status: AC

## 2015-11-17 MED ORDER — DM-GUAIFENESIN ER 30-600 MG PO TB12
1.0000 | ORAL_TABLET | Freq: Two times a day (BID) | ORAL | Status: DC
  Administered 2015-11-17: 1 via ORAL
  Filled 2015-11-17: qty 1

## 2015-11-17 MED ORDER — DM-GUAIFENESIN ER 30-600 MG PO TB12: 1.0000 | ORAL_TABLET | Freq: Two times a day (BID) | ORAL | Status: AC

## 2015-11-17 NOTE — Progress Notes (Signed)
Family Medicine Teaching Service Daily Progress Note Intern Pager: [phone]  Patient name: Judith Boyer Medical record number: [id number] Date of birth: [birth year] Age: 70 y.o. Gender: female  Primary Care Provider: No primary care provider on file. Consultants: CCM, Palliative Care Code Status: DNI  Pt Overview and Major Events to Date:  Admit 10/8 Start Cefepime (10/8-10/10) Start Vancomycin (10/9-10/10) Levaquin (10/10-)  Assessment and Plan: Judith Boyer is a 70 y.o. female presenting with acute on chronic respiratory failure . PMH is significant for End stage COPD, CAD w/ stent, HTN, DM2, protein calorie malnutrition.  # Acute on chronic respiratory failure:  Treating as HCAP given recent history of hospice care.  On Levaquin.  Has remained afebrile.  Blood cx NG 3D. This AM, patient on 6L o2.  Dyspnea with speech.  Did not wear Bipap all night.  Oxy and Ativan help her relax.  Per RN, patient requested it.  - On 125 Solumedrol daily--> plan to discharge home on long prednisone taper - Levaquin Day 3/7 750 mg --> plan to discharge home with 5 more days of Levaquin to complete a 10 day course of abx -Ativan 1 mg po every 4 hours prn -OxyIR 5 mg every 4 hours prn -Morphine 2 mg IV every 6 hours prn  # CAD w/ stent in 2009/ HTN.  BP 108/38.  Echo 2015: EF 0000000, normal systolic fnx. - Hold any home PO meds for now - No BB listed on previous med list.  Consider adding Metoprolol.  # DM2: A1c 7.2 (01/2015).  - sSSI while hospitalized - CBG QAC and HS - A1c with am labs   # GOC: Patient recently discharged from [facility] place. Was receiving home hospice.   Patient does not wish to be intubated however would like all other measures to be taken. Limited code.   - DNI placed in chart  - [name] (son) contact [phone] -Discussed inpatient hospice vs home with hospice with [facility] RN).  Patient does not meet criteria for inpatient hospice.  Will go home with  hospice.  Patient already has hospice at home in place.    FEN/GI: clear liquid, IV NS @kvo  Prophylaxis: Lovenox sub-q  Disposition: Discharge home with hospice today.  Subjective:  Patient off Bipap last night again.  Diet transitioned to soft.   No other complaints at this time.    Objective: Temp:  [97.7 F (36.5 C)-98.3 F (36.8 C)] 97.9 F (36.6 C) (10/12 1148) Pulse Rate:  [99-115] 104 (10/12 1148) Resp:  [18-24] 18 (10/12 1148) BP: (107-143)/(53-94) 107/94 (10/12 1148) SpO2:  [99 %-100 %] 100 % (10/12 1148) FiO2 (%):  [98 %] 98 % (10/11 1648) Physical Exam: General: tired and frail appearing elderly female, nasal cannula in place  Eyes: EOMI ENTM: MMM, oropharynx clear Neck: supple, no LAD Cardiovascular: tachycardic, did not appreciate any murmurs, rubs, gallops Respiratory: no discrete wheeze/ rhonchi/ rales, slight dyspnea with speech Gastrointestinal: soft, obese, NT/ND, +BS MSK: cool LE, no edema Derm: skin dry and intact Neuro: AAOx3, no focal deficits Psych: mood stable, pleasant  Laboratory:  Recent Labs Lab 11/15/15 0418 11/16/15 0345 11/17/15 0618  WBC 17.0* 17.6* 11.4*  HGB 9.3* 9.2* 9.5*  HCT 31.6* 30.2* 31.3*  PLT 274 280 278    Recent Labs Lab 11/14/15 0352 11/15/15 0418 11/16/15 0345 11/17/15 0618  NA 141 139 140 142  K 4.5 4.9 4.2 3.7  CL 103 102 101 98*  CO2 30 29 32 37*  BUN 19  20 14 12   CREATININE 0.56 0.44 0.47 0.48  CALCIUM 8.1* 8.2* 8.3* 8.4*  PROT 5.7*  --   --   --   BILITOT 0.5  --   --   --   ALKPHOS 75  --   --   --   ALT 24  --   --   --   AST 22  --   --   --   GLUCOSE 264* 236* 145* 132*   Imaging/Diagnostic Tests:  Dg Chest Port 1 View  Result Date: 11/16/2015 CLINICAL DATA:  Shortness of breath. Acute respiratory failure with hypoxia. EXAM: PORTABLE CHEST 1 VIEW COMPARISON:  11/14/2015 FINDINGS: There is mild bilateral interstitial prominence. The lungs are hyperinflated likely secondary to COPD. There is  no pleural effusion or pneumothorax. There is stable cardiomegaly. The osseous structures are unremarkable. IMPRESSION: 1. COPD. 2. No active cardiopulmonary disease. Electronically Signed   By: Kathreen Devoid   On: 11/16/2015 09:19   Portable Chest 1 View  Result Date: 11/14/2015 CLINICAL DATA:  Acute on chronic respiratory failure EXAM: PORTABLE CHEST 1 VIEW COMPARISON:  Yesterday FINDINGS: Chronic cardiopericardial enlargement. Stable aortic and hilar contour. Hyperinflation and interstitial coarsening. No edema or convincing pneumonia. No effusion or pneumothorax. Asymmetric density at the medial left apex is considered osseous based on two-view chest x-ray 06/11/2015. IMPRESSION: 1. COPD without acute superimposed finding. 2. Chronic cardiomegaly. Electronically Signed   By: Monte Fantasia M.D.   On: 11/14/2015 07:52   [name], MD 11/17/2015, 1:11 PM PGY-1, [place] Intern pager: [phone], text pages welcome

## 2015-11-17 NOTE — Progress Notes (Signed)
Patient refused K pad

## 2015-11-17 NOTE — Discharge Summary (Signed)
[facility] Discharge Summary  Patient name: Judith Boyer Medical record number: [id number] Date of birth: April 17, 1945 Age: 70 y.o. Gender: female Date of Admission: 11/13/2015  Date of Discharge: 11/17/2015 Admitting Physician: Leeanne Rio, MD  Primary Care Provider: No primary care provider on file. Consultants: CCM, Palliative  Indication for Hospitalization: acute on chronic respiratory failure  Discharge Diagnoses/Problem List:  Acute on chronic respiratory failure GOC discussion HCAP End stage COPD  Disposition: Home with hospice  Discharge Condition: Stable  Discharge Exam:  General: tired and frail appearing elderly female, nasal cannula in place  Eyes: EOMI ENTM: MMM, oropharynx clear Neck: supple, no LAD Cardiovascular: tachycardic, did not appreciate any murmurs, rubs, gallops Respiratory: no discrete wheeze/ rhonchi/ rales, slight dyspnea with speech Gastrointestinal: soft, obese, NT/ND, +BS MSK: cool LE, no edema Derm: skin dry and intact Neuro: AAOx3, no focal deficits Psych: mood stable, pleasant  Brief Hospital Course:  Judith Boyer a 70 y.o.femalepresenting with acute on chronic respiratory failure. PMH is significant for End stage COPD, CAD w/ stent, HTN, DM2, protein calorie malnutrition.   On admission, desats in the ED down to 69%.  Bipap was initiated and oxygenation improved to 97%.  Meeting sepsis criteria with tachycardia, tachpnea, elevated white count to 23.1 and afebrile with ph 7.16.  Was given solumedrol and started on Levaquin.  CXR with bibasilar opacities concerning for pneumonia vs atelectasis.  Treated as HCAP given her history of recent hospice care at [facility] place. Palliative was consulted given her poor condition and goals of care were defined.  She did not wish to be intubated however would like all other measures to be taken.   Over the next few days she was on and off her Bipap  intermittently as it was very uncomfortable on her face.  Remained dyspneic with speech.  Did not seem to improve much however was then remaining off bipap overnight on 6L of O2 as tolerated.  At home on 3L according to prior notes.  Discussed inpatient hospice vs home with hospice care with patient and her family.  However patient not meeting criteria for inpatient hospice.  Planned to discharge home on long prednisone taper and Levaquin to complete a 10 day course of antibiotics.  Was seen by palliative one last time to determine code status as she would be returning home with hospice care and patient stated she wished to live and remain full code. Documented, and patient was sent home on ambulance and 6L O2.  Has O2 available at home and family is available to assist with it.    Issues for Follow Up:  1. To complete 10 day course of Levaquin 2. To complete prednisone long taper, then remain on 20 mg daily  Significant Procedures: None  Significant Labs and Imaging:   Recent Labs Lab 11/15/15 0418 11/16/15 0345 11/17/15 0618  WBC 17.0* 17.6* 11.4*  HGB 9.3* 9.2* 9.5*  HCT 31.6* 30.2* 31.3*  PLT 274 280 278    Recent Labs Lab 11/13/15 0430 11/14/15 0352 11/15/15 0418 11/16/15 0345 11/17/15 0618  NA 137 141 139 140 142  K 4.5 4.5 4.9 4.2 3.7  CL 95* 103 102 101 98*  CO2 31 30 29  32 37*  GLUCOSE 370* 264* 236* 145* 132*  BUN 14 19 20 14 12   CREATININE 0.74 0.56 0.44 0.47 0.48  CALCIUM 9.0 8.1* 8.2* 8.3* 8.4*  MG  --  2.7* 2.6* 2.5* 2.1  PHOS  --  2.2*  2.1* 2.2* 2.1*  ALKPHOS  --  75  --   --   --   AST  --  22  --   --   --   ALT  --  24  --   --   --   ALBUMIN  --  2.9*  --   --   --    Dg Chest Port 1 View  Result Date: 11/16/2015 CLINICAL DATA:  Shortness of breath. Acute respiratory failure with hypoxia. EXAM: PORTABLE CHEST 1 VIEW COMPARISON:  11/14/2015 FINDINGS: There is mild bilateral interstitial prominence. The lungs are hyperinflated likely secondary to COPD.  There is no pleural effusion or pneumothorax. There is stable cardiomegaly. The osseous structures are unremarkable. IMPRESSION: 1. COPD. 2. No active cardiopulmonary disease. Electronically Signed   By: Kathreen Devoid   On: 11/16/2015 09:19   Results/Tests Pending at Time of Discharge: None  Discharge Medications:    Medication List    STOP taking these medications   diltiazem 240 MG 24 hr capsule Commonly known as:  DILACOR XR     TAKE these medications   ACCU-CHEK AVIVA PLUS test strip Generic drug:  glucose blood TEST twice a day   acetaminophen 325 MG tablet Commonly known as:  TYLENOL Take 2 tablets (650 mg total) by mouth every 6 (six) hours as needed for mild pain (or Fever >/= 101). What changed:  when to take this   albuterol 108 (90 Base) MCG/ACT inhaler Commonly known as:  PROAIR HFA Inhale 2 puffs into the lungs every 4 (four) hours as needed for wheezing or shortness of breath.   albuterol (2.5 MG/3ML) 0.083% nebulizer solution Commonly known as:  PROVENTIL Take 3 mLs (2.5 mg total) by nebulization every 4 (four) hours as needed for wheezing or shortness of breath. DX: 491.21   arformoterol 15 MCG/2ML Nebu Commonly known as:  BROVANA Take 2 mLs (15 mcg total) by nebulization 2 (two) times daily.   bisacodyl 10 MG suppository Commonly known as:  DULCOLAX Place 1 suppository (10 mg total) rectally daily as needed for moderate constipation.   budesonide 0.5 MG/2ML nebulizer solution Commonly known as:  PULMICORT Take 2 mLs (0.5 mg total) by nebulization 2 (two) times daily.   dextromethorphan-guaiFENesin 30-600 MG 12hr tablet Commonly known as:  MUCINEX DM Take 1 tablet by mouth 2 (two) times daily.   diltiazem 180 MG 24 hr capsule Commonly known as:  CARDIZEM CD Take 1 capsule (180 mg total) by mouth daily.   feeding supplement (GLUCERNA SHAKE) Liqd Take 237 mLs by mouth 3 (three) times daily between meals.   FLUoxetine 10 MG capsule Commonly known as:   PROZAC Take 10 mg by mouth daily.   gabapentin 100 MG capsule Commonly known as:  NEURONTIN Take 1 tablet at night  For restless leg syndrome. May increase to 2 tablets at night after 3-4 days if no improvement What changed:  how much to take  how to take this  when to take this  additional instructions   insulin aspart 100 UNIT/ML injection Commonly known as:  NOVOLOG Before each meal 3 times a day, 140-199 - 2 units, 200-250 - 4 units, 251-299 - 6 units,  300-349 - 8 units,  350 or above 10 units. Dispense syringes and needles as needed, Ok to switch to PEN if approved. Substitute to any brand approved. DX DM2, Code E11.65 What changed:  how much to take  how to take this  when to take this  additional instructions   insulin glargine 100 UNIT/ML injection Commonly known as:  LANTUS INJECT 25 UNITS INTO SKIN EVERY MORNING   Insulin Syringe-Needle U-100 30G 0.5 ML Misc Commonly known as:  RELI-ON INS SYR .5CC/30G Use as directed with insulin up to 3 times a day.  DX:  E11.9.  Please use short needles   ipratropium-albuterol 0.5-2.5 (3) MG/3ML Soln Commonly known as:  DUONEB Take 3 mLs by nebulization 4 (four) times daily - after meals and at bedtime.   levofloxacin 750 MG tablet Commonly known as:  LEVAQUIN Take 1 tablet (750 mg total) by mouth daily. Start taking on:  11/18/2015   LORazepam 1 MG tablet Commonly known as:  ATIVAN Take 1 tablet every 4 hours as needed What changed:  how much to take  how to take this  when to take this  reasons to take this  additional instructions   Melatonin 3 MG Tabs Take 3 mg by mouth at bedtime. Reported on 04/03/2015   montelukast 10 MG tablet Commonly known as:  SINGULAIR take 1 tablet by mouth once daily   nitroGLYCERIN 0.4 MG SL tablet Commonly known as:  NITROSTAT Place 1 tablet (0.4 mg total) under the tongue every 5 (five) minutes x 3 doses as needed for chest pain.   ondansetron 4 MG tablet Commonly  known as:  ZOFRAN Take 4 mg by mouth every 6 (six) hours as needed for nausea or vomiting.   oxyCODONE 5 MG immediate release tablet Commonly known as:  ROXICODONE Take 1 tablet every 2-4 hours as needed for respiratory distress. (no more than 12 tabs in 24 hours)   OXYGEN Inhale 3 L into the lungs continuous.   predniSONE 20 MG tablet Commonly known as:  DELTASONE Take 60 mg x 3 days, then 50 mg x 3 days, then 40 mg x 3 days, 30 mg x 3 days then 20 mg daily thereafter. What changed:  medication strength  how much to take  how to take this  when to take this  additional instructions   senna-docusate 8.6-50 MG tablet Commonly known as:  Senokot-S Take 2 tablets by mouth at bedtime.   sodium chloride 0.65 % Soln nasal spray Commonly known as:  OCEAN Place 1 spray into both nostrils as needed for congestion.      Discharge Instructions: Please refer to Patient Instructions section of EMR for full details.  Patient was counseled important signs and symptoms that should prompt return to medical care, changes in medications, dietary instructions, activity restrictions, and follow up appointments.   Follow-Up Appointments:  None  [name], MD 11/17/2015, 5:06 PM PGY-1, [place]

## 2015-11-17 NOTE — Progress Notes (Signed)
[place] and [facility] RN note. This is a GIP related and covered admission of 11/13/15, per Dr. Karie Georges. Patient has an [facility] diagnosis of COPD. Code status at this time is a limited code: No intubation.   Visited patient at bedside. There are no family members/visitors present.   Patient is awake upon my arrival.  She states she does not know how she is feeling.    Patient has not worn bi-pap in the past 24 hours.    Per chart review, patient has received Ativan 1 mg po for anxiety since midnight.  Patient has received  Oxycodone 5 mg X 1 for dyspnea  since midnight.    Spoke with Dr. Reesa Chew regarding discharge plans.     [facility] RN will continue to follow daily, and assist as needed.  Efrain Sella, RN, BSN   [phone]

## 2015-11-17 NOTE — Progress Notes (Signed)
Discharge instructions provided to patient.  Patient discharged home via ambulance attendants.

## 2015-11-17 NOTE — Care Management Note (Signed)
Case Management Note  Patient Details  Name: Judith Boyer MRN: [id number] Date of Birth: 10/01/1945  Subjective/Objective:    Patient is active with [facility], she is for dc today, NCM called son Judith Boyer and spoke with him about patient being dc today, he said to have ambulance pick her up at 3 pm because he is at work now and he should be home by then.  Patient is on 6 liters of oxygen , so she will need ambulance transport.  NCM was told by Abigail Butts , the [place] to call [facility] for transport.  NCM called EMS and they said there has been some confusion about hospice patient transports, that they only transport hospice patient 's to [facility] , or residential etc, they do not transport hospice patient's home.  [facility] said I need to call [facility].   NCM called Abigail Butts ,  But could not get her on the line, NCM called [facility] and spoke with the manager there she states there has been some confusion about the transport, but she said to call [facility] Board and she would be able to tell me quickly who to use.  NCM called Marzetta Board and she said to go ahead and use [facility] for transport and she will look into this because there has been confusion about this.  NCM verified address that patient will be going to [address], [place] [address], called [facility] for 3 pm transport.  Also Per MD patient is a full code patient so she did not sign a DNR form , MD sates she and palliative NP was in room to confirm.  NCM also informed [facility] Board of the full code status and she states yes they are aware , palliative called them.  Awaiting [facility] for transport.                Action/Plan:   Expected Discharge Date:                  Expected Discharge Plan:  Home w Hospice Care  In-House Referral:     Discharge planning Services  CM Consult  Post Acute Care Choice:  Hospice, Resumption of Svcs/PTA Provider Choice offered to:     DME Arranged:    DME Agency:     HH Arranged:    [place] Agency:     Status of  Service:  Completed, signed off  If discussed at [place] of Stay Meetings, dates discussed:    Additional Comments:  Zenon Mayo, RN 11/17/2015, 2:24 PM

## 2015-11-17 NOTE — Progress Notes (Signed)
Patient stated she takes Oxy and Ativan for her breathing. It helps her relax to breathe better. Oxy not given for pain, per pt's request.

## 2015-11-17 NOTE — Progress Notes (Signed)
Patient's Aunt at bedside.  Warm K-pad placed to patient's back.  Patient stating, "get me out of here, I want to go home now, I'll take a cab".  Aunt explaining that no one is at the house to take care of her and she needs to stay in the hospital for a while longer.  Patient settled down.

## 2015-11-18 ENCOUNTER — Telehealth: Payer: Self-pay

## 2015-11-18 LAB — CULTURE, BLOOD (ROUTINE X 2)
Culture: NO GROWTH
Culture: NO GROWTH

## 2015-11-18 NOTE — Telephone Encounter (Signed)
Got a message from [facility] to call Antony Madura, Patient's son, regarding Gwen's medication.  I was unable to get an answer on either of his lines, so I called the hospice nurse Rickey Barbara. Renee told me that patient was seen at ED last night and received 90 oxycodone and she does not need any more.  She feels "questionable" about patient's son being involved in her medication, and has gotten the neighbor involved in the care of the patient's medication.  She states that if it is okay with Dr. Tamala Julian, the Hospice doctor, Dr. Lyman Speller will start taking care of the ativan and oxycodone every week. Renee would like Dr. Tamala Julian to continue to fill patient's other medications, however.  She also would like Dr. Tamala Julian to know that when the patient picked up her pro air from the pharmacy that it is defective and will not dispense the medication.  She is wondering if you can refill that medication now.  Thank you!

## 2015-11-18 NOTE — Telephone Encounter (Signed)
Noted.  Discharge summary reviewed.  Discharged home with hospice care; no follow-up appointments recommended.

## 2015-11-19 MED ORDER — ALBUTEROL SULFATE HFA 108 (90 BASE) MCG/ACT IN AERS: 2.0000 | INHALATION_SPRAY | RESPIRATORY_TRACT | 11 refills | Status: AC | PRN

## 2015-11-19 NOTE — Telephone Encounter (Signed)
Call -- I refilled ProAir for patient; however, insurance will likely not cover another ProAir.  I would return ProAir to pharmacy and advise them that it is not working correctly.

## 2015-11-21 NOTE — Telephone Encounter (Signed)
Patient aware via phone and will try and return ProAir that is not working

## 2015-12-06 ENCOUNTER — Telehealth: Payer: Self-pay | Admitting: Family Medicine

## 2015-12-06 NOTE — Telephone Encounter (Signed)
Rickey Barbara from [facility] phone number [phone] States that pt need a refill on prednisone 20 mg once daily pt was in hospital 2-3 weeks ago and they put her on prednisone 20 mg QD please write RX for that if you need to speak with nurse please call Renee at [phone]

## 2015-12-07 MED ORDER — PREDNISONE 20 MG PO TABS: ORAL_TABLET | ORAL | 2 refills | Status: AC

## 2015-12-07 NOTE — Telephone Encounter (Signed)
Dr Tamala Julian, I can't tell by sig on Rx written by hospitalist if they intended pt to remain on prednisone long term? It looks like a taper with just enough tablets to last through 3 days of the lowest dose  (20 mg QD). I LM with Hospice nurse to [facility] with any info she has on this, but will send on to you in case you have already discussed pred use for pt and have plan for her treatment.

## 2015-12-07 NOTE — Telephone Encounter (Signed)
Per hospital discharge summary, long prednisone taper and then to remain on 20mg  daily. Rx for Prednisone 20mg  one daily sent to [facility].  Please call Hospice and advise of refill approved and sent to [facility]; is this where she needs it sent?

## 2015-12-08 NOTE — Telephone Encounter (Signed)
Spoke to [place] and advised we sent in the prednisone. Asked her about pt's runny nose to see if there are other Sxs. Renee stated that pt's lung sounds are always diminished from COPD, but she has no wheezing or coughing, no fever or other Sxs at all other than runny nose. She is taking the Mucinex DM that was Rxd from the hospital. I suggested that pt should drink plenty of water and continue the Mucinex to help keep mucous thin so that it doesn't settle in her lungs. Renee stated that pt is always nervous about everything and asked if I would call pt to tell her that so that she will worry less. Tried to call pt but no answer and VM full. Other # was disconnected and I took it off demo page. Dr Tamala Julian, is there any other medication you would suggest pt to take for her runny nose that will be safe for her? Antihistamine?

## 2015-12-09 NOTE — Telephone Encounter (Signed)
Patient can try Claritin 10mg  one daily and also try nasal saline spray.

## 2015-12-09 NOTE — Telephone Encounter (Signed)
Called Renee back and LM advising of Dr Thompson Caul medication suggestions below.

## 2015-12-26 ENCOUNTER — Telehealth: Payer: Self-pay | Admitting: Emergency Medicine

## 2015-12-26 NOTE — Telephone Encounter (Signed)
Dr. Tamala Julian,  Renee from Hospice called requesting antibiotic order for patient after am home visit- Upon assessment vital signs- 150/80, 121, 28 and 95% room air associated with green phlegm, cough and vomiting up food.  Advised home care nurse that patient is new to you and need to schedule office visit before placing orders.   Patient is home bound with limited mobility and can not come to clinic. Advised to call Hospice attending and/or have patient go to ER for further evaluation.

## 2015-12-26 NOTE — Telephone Encounter (Signed)
Hospice nurse called back to report attending hospice doctor will put patient on 5 day Z-pak with instructions to go directly to ER if no improvement.

## 2015-12-28 ENCOUNTER — Encounter (HOSPITAL_COMMUNITY): Payer: Self-pay | Admitting: Emergency Medicine

## 2015-12-28 ENCOUNTER — Inpatient Hospital Stay (HOSPITAL_COMMUNITY)
Admission: EM | Admit: 2015-12-28 | Discharge: 2015-12-30 | DRG: 193 | Disposition: A | Discharge status: Hospice | Attending: Internal Medicine | Admitting: Internal Medicine

## 2015-12-28 ENCOUNTER — Emergency Department (HOSPITAL_COMMUNITY)

## 2015-12-28 DIAGNOSIS — J9621 Acute and chronic respiratory failure with hypoxia: Secondary | ICD-10-CM | POA: Diagnosis present

## 2015-12-28 DIAGNOSIS — Z87891 Personal history of nicotine dependence: Secondary | ICD-10-CM

## 2015-12-28 DIAGNOSIS — R0602 Shortness of breath: Secondary | ICD-10-CM | POA: Diagnosis not present

## 2015-12-28 DIAGNOSIS — E7800 Pure hypercholesterolemia, unspecified: Secondary | ICD-10-CM | POA: Diagnosis not present

## 2015-12-28 DIAGNOSIS — Z833 Family history of diabetes mellitus: Secondary | ICD-10-CM | POA: Diagnosis not present

## 2015-12-28 DIAGNOSIS — E119 Type 2 diabetes mellitus without complications: Secondary | ICD-10-CM | POA: Diagnosis not present

## 2015-12-28 DIAGNOSIS — Z801 Family history of malignant neoplasm of trachea, bronchus and lung: Secondary | ICD-10-CM | POA: Diagnosis not present

## 2015-12-28 DIAGNOSIS — Z79899 Other long term (current) drug therapy: Secondary | ICD-10-CM

## 2015-12-28 DIAGNOSIS — Z7951 Long term (current) use of inhaled steroids: Secondary | ICD-10-CM | POA: Diagnosis not present

## 2015-12-28 DIAGNOSIS — Z90710 Acquired absence of both cervix and uterus: Secondary | ICD-10-CM | POA: Diagnosis not present

## 2015-12-28 DIAGNOSIS — I252 Old myocardial infarction: Secondary | ICD-10-CM

## 2015-12-28 DIAGNOSIS — E538 Deficiency of other specified B group vitamins: Secondary | ICD-10-CM | POA: Diagnosis not present

## 2015-12-28 DIAGNOSIS — I2510 Atherosclerotic heart disease of native coronary artery without angina pectoris: Secondary | ICD-10-CM | POA: Diagnosis present

## 2015-12-28 DIAGNOSIS — Z7952 Long term (current) use of systemic steroids: Secondary | ICD-10-CM

## 2015-12-28 DIAGNOSIS — J189 Pneumonia, unspecified organism: Principal | ICD-10-CM | POA: Diagnosis present

## 2015-12-28 DIAGNOSIS — Z515 Encounter for palliative care: Secondary | ICD-10-CM | POA: Diagnosis present

## 2015-12-28 DIAGNOSIS — Z808 Family history of malignant neoplasm of other organs or systems: Secondary | ICD-10-CM | POA: Diagnosis not present

## 2015-12-28 DIAGNOSIS — Z955 Presence of coronary angioplasty implant and graft: Secondary | ICD-10-CM

## 2015-12-28 DIAGNOSIS — E118 Type 2 diabetes mellitus with unspecified complications: Secondary | ICD-10-CM | POA: Diagnosis not present

## 2015-12-28 DIAGNOSIS — Z79891 Long term (current) use of opiate analgesic: Secondary | ICD-10-CM | POA: Diagnosis not present

## 2015-12-28 DIAGNOSIS — Z66 Do not resuscitate: Secondary | ICD-10-CM | POA: Diagnosis not present

## 2015-12-28 DIAGNOSIS — J441 Chronic obstructive pulmonary disease with (acute) exacerbation: Secondary | ICD-10-CM | POA: Diagnosis present

## 2015-12-28 DIAGNOSIS — Z794 Long term (current) use of insulin: Secondary | ICD-10-CM | POA: Diagnosis not present

## 2015-12-28 DIAGNOSIS — I10 Essential (primary) hypertension: Secondary | ICD-10-CM | POA: Diagnosis present

## 2015-12-28 DIAGNOSIS — Y95 Nosocomial condition: Secondary | ICD-10-CM | POA: Diagnosis present

## 2015-12-28 DIAGNOSIS — J440 Chronic obstructive pulmonary disease with acute lower respiratory infection: Secondary | ICD-10-CM | POA: Diagnosis present

## 2015-12-28 DIAGNOSIS — Z9981 Dependence on supplemental oxygen: Secondary | ICD-10-CM

## 2015-12-28 DIAGNOSIS — Z8249 Family history of ischemic heart disease and other diseases of the circulatory system: Secondary | ICD-10-CM | POA: Diagnosis not present

## 2015-12-28 DIAGNOSIS — J9601 Acute respiratory failure with hypoxia: Secondary | ICD-10-CM | POA: Diagnosis present

## 2015-12-28 LAB — CBC WITH DIFFERENTIAL/PLATELET
BASOS ABS: 0 10*3/uL (ref 0.0–0.1)
BASOS PCT: 0 %
EOS ABS: 0.1 10*3/uL (ref 0.0–0.7)
Eosinophils Relative: 1 %
HCT: 38.3 % (ref 36.0–46.0)
HEMOGLOBIN: 11.7 g/dL — AB (ref 12.0–15.0)
Lymphocytes Relative: 12 %
Lymphs Abs: 1.9 10*3/uL (ref 0.7–4.0)
MCH: 27.7 pg (ref 26.0–34.0)
MCHC: 30.5 g/dL (ref 30.0–36.0)
MCV: 90.5 fL (ref 78.0–100.0)
Monocytes Absolute: 1.3 10*3/uL — ABNORMAL HIGH (ref 0.1–1.0)
Monocytes Relative: 8 %
NEUTROS PCT: 79 %
Neutro Abs: 12.9 10*3/uL — ABNORMAL HIGH (ref 1.7–7.7)
Platelets: 479 10*3/uL — ABNORMAL HIGH (ref 150–400)
RBC: 4.23 MIL/uL (ref 3.87–5.11)
RDW: 13.5 % (ref 11.5–15.5)
WBC: 16.2 10*3/uL — AB (ref 4.0–10.5)

## 2015-12-28 LAB — URINALYSIS, ROUTINE W REFLEX MICROSCOPIC
BILIRUBIN URINE: NEGATIVE
GLUCOSE, UA: 250 mg/dL — AB
HGB URINE DIPSTICK: NEGATIVE
KETONES UR: 15 mg/dL — AB
Nitrite: NEGATIVE
PH: 6 (ref 5.0–8.0)
PROTEIN: 100 mg/dL — AB
Specific Gravity, Urine: 1.025 (ref 1.005–1.030)

## 2015-12-28 LAB — I-STAT VENOUS BLOOD GAS, ED
Acid-Base Excess: 12 mmol/L — ABNORMAL HIGH (ref 0.0–2.0)
Bicarbonate: 40.5 mmol/L — ABNORMAL HIGH (ref 20.0–28.0)
O2 Saturation: 98 %
PCO2 VEN: 70.1 mmHg — AB (ref 44.0–60.0)
PH VEN: 7.37 (ref 7.250–7.430)
TCO2: 43 mmol/L (ref 0–100)
pO2, Ven: 107 mmHg — ABNORMAL HIGH (ref 32.0–45.0)

## 2015-12-28 LAB — COMPREHENSIVE METABOLIC PANEL
ALBUMIN: 2.8 g/dL — AB (ref 3.5–5.0)
ALT: 22 U/L (ref 14–54)
ANION GAP: 11 (ref 5–15)
AST: 32 U/L (ref 15–41)
Alkaline Phosphatase: 99 U/L (ref 38–126)
BUN: 17 mg/dL (ref 6–20)
CO2: 35 mmol/L — AB (ref 22–32)
Calcium: 8.9 mg/dL (ref 8.9–10.3)
Chloride: 92 mmol/L — ABNORMAL LOW (ref 101–111)
Creatinine, Ser: 0.56 mg/dL (ref 0.44–1.00)
GFR calc non Af Amer: >60 mL/min (ref >60)
GLUCOSE: 125 mg/dL — AB (ref 65–99)
POTASSIUM: 3.6 mmol/L (ref 3.5–5.1)
SODIUM: 138 mmol/L (ref 135–145)
TOTAL PROTEIN: 6.7 g/dL (ref 6.5–8.1)
Total Bilirubin: 0.1 mg/dL — ABNORMAL LOW (ref 0.3–1.2)

## 2015-12-28 LAB — I-STAT TROPONIN, ED
TROPONIN I, POC: 0 ng/mL (ref 0.00–0.08)
TROPONIN I, POC: 0.02 ng/mL (ref 0.00–0.08)

## 2015-12-28 LAB — CBG MONITORING, ED: Glucose-Capillary: 123 mg/dL — ABNORMAL HIGH (ref 65–99)

## 2015-12-28 LAB — URINE MICROSCOPIC-ADD ON: RBC / HPF: NONE SEEN RBC/hpf (ref 0–5)

## 2015-12-28 LAB — I-STAT CG4 LACTIC ACID, ED: Lactic Acid, Venous: 0.9 mmol/L (ref 0.5–1.9)

## 2015-12-28 LAB — BRAIN NATRIURETIC PEPTIDE: B NATRIURETIC PEPTIDE 5: 211.4 pg/mL — AB (ref 0.0–100.0)

## 2015-12-28 MED ORDER — ASPIRIN 81 MG PO CHEW: 324.0000 mg | CHEWABLE_TABLET | Freq: Once | ORAL | Status: DC

## 2015-12-28 MED ORDER — IPRATROPIUM-ALBUTEROL 0.5-2.5 (3) MG/3ML IN SOLN
3.0000 mL | Freq: Once | RESPIRATORY_TRACT | Status: AC
  Administered 2015-12-28: 3 mL via RESPIRATORY_TRACT
  Filled 2015-12-28: qty 6

## 2015-12-28 MED ORDER — SODIUM CHLORIDE 0.9 % IV BOLUS (SEPSIS)
1000.0000 mL | Freq: Once | INTRAVENOUS | Status: AC
  Administered 2015-12-28: 1000 mL via INTRAVENOUS

## 2015-12-28 MED ORDER — VANCOMYCIN HCL IN DEXTROSE 1-5 GM/200ML-% IV SOLN
1000.0000 mg | Freq: Once | INTRAVENOUS | Status: AC
  Administered 2015-12-28: 1000 mg via INTRAVENOUS
  Filled 2015-12-28: qty 200

## 2015-12-28 MED ORDER — NITROGLYCERIN 0.4 MG SL SUBL
SUBLINGUAL_TABLET | SUBLINGUAL | Status: AC
  Filled 2015-12-28: qty 1

## 2015-12-28 MED ORDER — DEXTROSE 5 % IV SOLN
2.0000 g | Freq: Once | INTRAVENOUS | Status: AC
  Administered 2015-12-28: 2 g via INTRAVENOUS
  Filled 2015-12-28: qty 2

## 2015-12-28 MED ORDER — ASPIRIN 300 MG RE SUPP
300.0000 mg | Freq: Once | RECTAL | Status: AC
  Administered 2015-12-28: 300 mg via RECTAL
  Filled 2015-12-28: qty 1

## 2015-12-28 MED ORDER — METHYLPREDNISOLONE SODIUM SUCC 125 MG IJ SOLR
125.0000 mg | Freq: Once | INTRAMUSCULAR | Status: AC
  Administered 2015-12-28: 125 mg via INTRAVENOUS
  Filled 2015-12-28: qty 2

## 2015-12-28 MED ORDER — NITROGLYCERIN 0.4 MG SL SUBL: 0.4000 mg | SUBLINGUAL_TABLET | SUBLINGUAL | Status: DC | PRN

## 2015-12-28 MED ORDER — NITROGLYCERIN IN D5W 200-5 MCG/ML-% IV SOLN
0.0000 ug/min | Freq: Once | INTRAVENOUS | Status: AC
  Administered 2015-12-28: 10 ug/min via INTRAVENOUS
  Filled 2015-12-28: qty 250

## 2015-12-28 NOTE — ED Triage Notes (Signed)
Patient presents with EMS from home reports worsening SOB with dry cough/wheezing for several days .

## 2015-12-28 NOTE — ED Provider Notes (Signed)
[facility] DEPT Provider Note   CSN: [id number] Arrival date & time: 12/28/15  1919     History   Chief Complaint Chief Complaint  Patient presents with  . Shortness of Breath    COPD    HPI Judith Boyer is a 70 y.o. female.   Shortness of Breath  This is a new problem. The problem occurs continuously.The problem has not changed since onset.Pertinent negatives include no fever, no sore throat, no ear pain, no cough, no sputum production, no chest pain, no vomiting, no abdominal pain, no rash and no leg swelling. She has tried ipratropium inhalers and beta-agonist inhalers for the symptoms. The treatment provided mild relief. She has had prior hospitalizations. She has had prior ED visits. She has had prior ICU admissions. Associated medical issues include COPD and heart failure.    Past Medical History:  Diagnosis Date  . Anemia   . COPD (chronic obstructive pulmonary disease) ([place])   . Coronary artery disease   . High cholesterol   . History of blood transfusion 2014   "extremely anemic"  . Hypertension   . Myocardial infarction 02/2011  . On home oxygen therapy    "2L; 24h/day" (12/20/2014)  . Pneumonia    "couple times" (02/08/2014)  . Shortness of breath   . Type II diabetes mellitus ([place])    type 2    Patient Active Problem List   Diagnosis Date Noted  . Acute respiratory failure with hypoxia ([place]) 12/29/2015  . Acute and chronic respiratory failure with hypercapnia ([place]) 11/13/2015  . Acute on chronic respiratory failure ([place]) 11/13/2015  . Elevated troponin I level   . Goals of care, counseling/discussion   . CAP (community acquired pneumonia) 06/10/2015  . CAD (coronary artery disease) 06/10/2015  . Chronic constipation 06/10/2015  . Encounter for palliative care   . 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 Hospice care patient   . Type 2 diabetes mellitus with complication, with long-term current use of insulin ([place])   . Hypoglycemia   . Hypokalemia   .  SOB (shortness of breath)   . Acute respiratory failure ([place]) 01/30/2015  . Healthcare-associated pneumonia 01/30/2015  . DNR (do not resuscitate) discussion 12/29/2014  . Acute on chronic respiratory failure with hypoxia and hypercapnia (HCC)   . Hyperkalemia 12/25/2014  . Tobacco abuse   . Noncompliance   . Acute on chronic respiratory failure with hypoxia ([place])   . Acute exacerbation of chronic obstructive pulmonary disease (COPD) ([place])   . Diabetes mellitus with complication ([place])   . Chronic respiratory failure with hypercapnia ([place]) 09/24/2014  . Palliative care by specialist   . Dysphagia   . Dyspnea 08/13/2014  . Tachycardia   . Respiratory acidosis   . Coronary artery disease involving native coronary artery of native heart without angina pectoris   . Anxiety   . B12 deficiency 02/10/2014  . Protein-calorie malnutrition, severe ([place]) 02/09/2014  . Acute respiratory distress (HCC) 02/08/2014  . Vitamin B 12 deficiency 06/28/2013  . Palliative care encounter 02/25/2013  . Weakness generalized 02/25/2013  . COPD exacerbation ([place]) 02/20/2013  . Anemia 02/20/2013  . Nocturnal hypoxemia 12/30/2012  . COPD GOLD III 02/06/2011  . Smoker 02/06/2011  . Hypertension 02/06/2011  . Diabetes mellitus type 2, controlled, without complications ([place]) [id number]    Past Surgical History:  Procedure Laterality Date  . ABDOMINAL HYSTERECTOMY  1970's  . CARDIAC CATHETERIZATION  02/2011  . CORONARY ANGIOPLASTY WITH STENT PLACEMENT  12/2007   "  2"  . LEFT HEART CATHETERIZATION WITH CORONARY ANGIOGRAM N/A 02/09/2011   Procedure: LEFT HEART CATHETERIZATION WITH CORONARY ANGIOGRAM;  Surgeon: Birdie Riddle, MD;  Location: [facility] CATH LAB;  Service: Cardiovascular;  Laterality: N/A;  . SHOULDER ARTHROSCOPY W/ ROTATOR CUFF REPAIR Left   . TONSILLECTOMY      OB History    No data available       Home Medications    Prior to Admission medications   Medication Sig Start Date End Date  Taking? Authorizing Provider  ACCU-CHEK AVIVA PLUS test strip TEST twice a day Patient not taking: Reported on 11/13/2015 10/27/15   [name], MD  acetaminophen (TYLENOL) 325 MG tablet Take 2 tablets (650 mg total) by mouth every 6 (six) hours as needed for mild pain (or Fever >/= 101). Patient taking differently: Take 650 mg by mouth every 4 (four) hours as needed for mild pain (or Fever >/= 101).  12/31/14   Albertine Patricia, MD  albuterol (PROAIR HFA) 108 (90 Base) MCG/ACT inhaler Inhale 2 puffs into the lungs every 4 (four) hours as needed for wheezing or shortness of breath. 11/19/15   Wardell Honour, MD  albuterol (PROVENTIL) (2.5 MG/3ML) 0.083% nebulizer solution Take 3 mLs (2.5 mg total) by nebulization every 4 (four) hours as needed for wheezing or shortness of breath. DX: 491.21 10/27/15   [name], MD  arformoterol (BROVANA) 15 MCG/2ML NEBU Take 2 mLs (15 mcg total) by nebulization 2 (two) times daily. Patient not taking: Reported on 11/13/2015 12/31/14   Silver Huguenin Elgergawy, MD  bisacodyl (DULCOLAX) 10 MG suppository Place 1 suppository (10 mg total) rectally daily as needed for moderate constipation. Patient not taking: Reported on 11/13/2015 12/31/14   Silver Huguenin Elgergawy, MD  budesonide (PULMICORT) 0.5 MG/2ML nebulizer solution Take 2 mLs (0.5 mg total) by nebulization 2 (two) times daily. Patient not taking: Reported on 11/13/2015 08/02/15   [name], MD  dextromethorphan-guaiFENesin [facility] DM) 30-600 MG 12hr tablet Take 1 tablet by mouth 2 (two) times daily. 11/17/15   [name], MD  diltiazem (CARDIZEM CD) 180 MG 24 hr capsule Take 1 capsule (180 mg total) by mouth daily. Patient not taking: Reported on 11/13/2015 03/16/15   [name], MD  feeding supplement, GLUCERNA SHAKE, (GLUCERNA SHAKE) LIQD Take 237 mLs by mouth 3 (three) times daily between meals. Patient not taking: Reported on 11/13/2015 12/31/14   Silver Huguenin Elgergawy, MD  FLUoxetine (PROZAC) 10 MG capsule  Take 10 mg by mouth daily.    Historical Provider, MD  gabapentin (NEURONTIN) 100 MG capsule Take 1 tablet at night  For restless leg syndrome. May increase to 2 tablets at night after 3-4 days if no improvement Patient taking differently: Take 100-200 mg by mouth See admin instructions. Take 100 mg by mouth at night for restless leg syndrome. May increase to 200 mg at night after 3-4 days if no improvement. 10/28/15   [name], MD  insulin aspart (NOVOLOG) 100 UNIT/ML injection Before each meal 3 times a day, 140-199 - 2 units, 200-250 - 4 units, 251-299 - 6 units,  300-349 - 8 units,  350 or above 10 units. Dispense syringes and needles as needed, Ok to switch to PEN if approved. Substitute to any brand approved. DX DM2, Code E11.65 Patient taking differently: Inject 2-16 Units into the skin See admin instructions. Inject 16 units subque 3 times daily with meals or sliding scale , 140-199 - 2 units, 200-250 -  4 units, 251-299 - 6 units,  300-349 - 8 units,  350 or above 10 units. Dispense syringes and needles as needed, Ok to switch to PEN if approved. Substitute to any brand approved. DX DM2, Code E11.65 07/06/15   [name], MD  insulin glargine (LANTUS) 100 UNIT/ML injection INJECT 25 UNITS INTO SKIN EVERY MORNING 07/06/15   [name], MD  Insulin Syringe-Needle U-100 (RELI-ON INS SYR .5CC/30G) 30G 0.5 ML MISC Use as directed with insulin up to 3 times a day.  DX:  E11.9.  Please use short needles Patient not taking: Reported on 11/13/2015 09/12/15   [name], MD  ipratropium-albuterol (DUONEB) 0.5-2.5 (3) MG/3ML SOLN Take 3 mLs by nebulization 4 (four) times daily - after meals and at bedtime. 10/27/15   [name], MD  levofloxacin (LEVAQUIN) 750 MG tablet Take 1 tablet (750 mg total) by mouth daily. 11/18/15   [name], MD  LORazepam (ATIVAN) 1 MG tablet Take 1 tablet every 4 hours as needed Patient taking differently: Take 1 mg by mouth every 4 (four) hours as needed for  anxiety.  08/04/15   [name], MD  Melatonin 3 MG TABS Take 3 mg by mouth at bedtime. Reported on 04/03/2015    Historical Provider, MD  montelukast (SINGULAIR) 10 MG tablet take 1 tablet by mouth once daily Patient not taking: Reported on 11/13/2015 07/09/14   [name], MD  nitroGLYCERIN (NITROSTAT) 0.4 MG SL tablet Place 1 tablet (0.4 mg total) under the tongue every 5 (five) minutes x 3 doses as needed for chest pain. 02/07/12   Dixie Dials, MD  ondansetron (ZOFRAN) 4 MG tablet Take 4 mg by mouth every 6 (six) hours as needed for nausea or vomiting.    Historical Provider, MD  oxyCODONE (ROXICODONE) 5 MG immediate release tablet Take 1 tablet every 2-4 hours as needed for respiratory distress. (no more than 12 tabs in 24 hours) 10/28/15   [name], MD  OXYGEN Inhale 3 L into the lungs continuous.     Historical Provider, MD  predniSONE (DELTASONE) 20 MG tablet 20 mg daily 12/07/15   Wardell Honour, MD  senna-docusate (SENOKOT-S) 8.6-50 MG tablet Take 2 tablets by mouth at bedtime. Patient not taking: Reported on 11/13/2015 12/31/14   Silver Huguenin Elgergawy, MD  sodium chloride (OCEAN) 0.65 % SOLN nasal spray Place 1 spray into both nostrils as needed for congestion. Patient not taking: Reported on 11/13/2015 12/31/14   Albertine Patricia, MD    Family History Family History  Problem Relation Age of Onset  . Lung cancer Mother     smoked  . Diabetes Mother   . Heart disease Maternal Grandmother   . Brain cancer Brother   . Diabetes Brother     Social History Social History  Substance Use Topics  . Smoking status: Former Smoker    Packs/day: 1.00    Years: 50.00    Types: Cigarettes    Quit date: 01/10/2013  . Smokeless tobacco: Never Used  . Alcohol use No     Allergies   Doxycycline; Codeine; Dulera [mometasone furo-formoterol fum]; Adhesive [tape]; Latex; Lisinopril; and Oxycodone   Review of Systems Review of Systems  Constitutional: Negative for chills and fever.    HENT: Negative for ear pain and sore throat.   Eyes: Negative for pain and visual disturbance.  Respiratory: Positive for shortness of breath. Negative for cough and sputum production.   Cardiovascular: Negative for  chest pain, palpitations and leg swelling.  Gastrointestinal: Negative for abdominal pain and vomiting.  Genitourinary: Negative for dysuria and hematuria.  Musculoskeletal: Negative for arthralgias and back pain.  Skin: Negative for color change and rash.  Neurological: Negative for seizures and syncope.  All other systems reviewed and are negative.    Physical Exam Updated Vital Signs BP (!) 160/93 (BP Location: Left Arm)   Pulse 101   Temp 97.6 F (36.4 C) (Oral)   Resp 24   Ht 5\' 1"  (1.549 m)   Wt 53.8 kg   SpO2 100%   BMI 22.41 kg/m   Physical Exam  Constitutional: She appears well-developed and well-nourished. No distress.  HENT:  Head: Normocephalic and atraumatic.  Eyes: Conjunctivae are normal.  Neck: Neck supple.  Cardiovascular: Normal rate and regular rhythm.   No murmur heard. Pulmonary/Chest: Tachypnea noted. She is in respiratory distress. She has decreased breath sounds. She has wheezes.  Abdominal: Soft. There is no tenderness.  Musculoskeletal: She exhibits no edema.  Neurological: She is alert.  Skin: Skin is warm and dry.  Psychiatric: She has a normal mood and affect.  Nursing note and vitals reviewed.    ED Treatments / Results  Labs (all labs ordered are listed, but only abnormal results are displayed) Labs Reviewed  CBC WITH DIFFERENTIAL/PLATELET - Abnormal; Notable for the following:       Result Value   WBC 16.2 (*)    Hemoglobin 11.7 (*)    Platelets 479 (*)    Neutro Abs 12.9 (*)    Monocytes Absolute 1.3 (*)    All other components within normal limits  BRAIN NATRIURETIC PEPTIDE - Abnormal; Notable for the following:    B Natriuretic Peptide 211.4 (*)    All other components within normal limits  COMPREHENSIVE  METABOLIC PANEL - Abnormal; Notable for the following:    Chloride 92 (*)    CO2 35 (*)    Glucose, Bld 125 (*)    Albumin 2.8 (*)    Total Bilirubin 0.1 (*)    All other components within normal limits  URINALYSIS, ROUTINE W REFLEX MICROSCOPIC (NOT AT [facility]) - Abnormal; Notable for the following:    Color, Urine AMBER (*)    APPearance CLOUDY (*)    Glucose, UA 250 (*)    Ketones, ur 15 (*)    Protein, ur 100 (*)    Leukocytes, UA SMALL (*)    All other components within normal limits  URINE MICROSCOPIC-ADD ON - Abnormal; Notable for the following:    Squamous Epithelial / LPF 0-5 (*)    Bacteria, UA RARE (*)    Casts GRANULAR CAST (*)    All other components within normal limits  CBG MONITORING, ED - Abnormal; Notable for the following:    Glucose-Capillary 123 (*)    All other components within normal limits  I-STAT VENOUS BLOOD GAS, ED - Abnormal; Notable for the following:    pCO2, Ven 70.1 (*)    pO2, Ven 107.0 (*)    Bicarbonate 40.5 (*)    Acid-Base Excess 12.0 (*)    All other components within normal limits  CULTURE, BLOOD (ROUTINE X 2)  CULTURE, BLOOD (ROUTINE X 2)  CULTURE, EXPECTORATED SPUTUM-ASSESSMENT  GRAM STAIN  MRSA PCR SCREENING  BASIC METABOLIC PANEL  CBC  I-STAT TROPOININ, ED  I-STAT CG4 LACTIC ACID, ED  Randolm Idol, ED    EKG  EKG Interpretation  Date/Time:  Wednesday December 28 2015 19:23:37 EST Ventricular  Rate:  121 PR Interval:    QRS Duration: 99 QT Interval:  340 QTC Calculation: 483 R Axis:   83 Text Interpretation:  Sinus tachycardia Borderline right axis deviation Probable left ventricular hypertrophy Artifact in lead(s) I II and baseline wander in lead(s) II III aVF V2 No significant change since last tracing Confirmed by [facility] MD, Junie Panning ([id number]) on 12/28/2015 10:26:07 PM       Radiology Dg Chest Portable 1 View  Result Date: 12/28/2015 CLINICAL DATA:  Patient presents with EMS from home reports worsening SOB with dry  cough/wheezing for several days . EXAM: PORTABLE CHEST 1 VIEW COMPARISON:  11/16/2015 FINDINGS: Normal cardiac silhouette. New RIGHT lower lobe pneumonia. Lungs are hyperinflated. IMPRESSION: RIGHT lower lobe pneumonia. Electronically Signed   By: Suzy Bouchard M.D.   On: 12/28/2015 19:50    Procedures Procedures (including critical care time)  Procedure note: Ultrasound Guided Peripheral IV Ultrasound guided peripheral 1.88 inch angiocath IV placement performed by me. Indications: Nursing unable to place IV. Details: The antecubital fossa and upper arm were evaluated with a multifrequency linear probe. Patent brachial veins were noted. 1 attempt was made to cannulate a vein under realtime US guidance with successful cannulation of the vein and catheter placement. There is return of non-pulsatile dark red blood. The patient tolerated the procedure well without complications. Images archived electronically.  CPT codes: [phone] and [phone]  Medications Ordered in ED Medications  nitroGLYCERIN (NITROSTAT) SL tablet 0.4 mg (0.4 mg Sublingual Not Given 12/29/15 0007)  dextromethorphan-guaiFENesin ([place] DM) 30-600 MG per 12 hr tablet 1 tablet (1 tablet Oral Given 12/29/15 0106)  FLUoxetine (PROZAC) capsule 10 mg (not administered)  gabapentin (NEURONTIN) capsule 100-200 mg (not administered)  oxyCODONE (Oxy IR/ROXICODONE) immediate release tablet 5 mg (not administered)  LORazepam (ATIVAN) tablet 1 mg (not administered)  insulin glargine (LANTUS) injection 25 Units (not administered)  diltiazem (CARDIZEM CD) 24 hr capsule 180 mg (not administered)  cyanocobalamin ((VITAMIN B-12)) injection 1,000 mcg (not administered)  methylPREDNISolone sodium succinate (SOLU-MEDROL) 40 mg/mL injection 40 mg (not administered)  ipratropium-albuterol (DUONEB) 0.5-2.5 (3) MG/3ML nebulizer solution 3 mL (not administered)  ipratropium-albuterol (DUONEB) 0.5-2.5 (3) MG/3ML nebulizer solution 3 mL (not administered)    budesonide (PULMICORT) nebulizer solution 0.25 mg (not administered)  acetaminophen (TYLENOL) tablet 650 mg (not administered)    Or  acetaminophen (TYLENOL) suppository 650 mg (not administered)  ondansetron (ZOFRAN) tablet 4 mg (not administered)    Or  ondansetron (ZOFRAN) injection 4 mg (not administered)  ceFEPIme (MAXIPIME) 1 g in dextrose 5 % 50 mL IVPB (not administered)  enoxaparin (LOVENOX) injection 40 mg (not administered)  sodium chloride flush (NS) 0.9 % injection 3 mL (not administered)  vancomycin (VANCOCIN) IVPB 1000 mg/200 mL premix (not administered)  nitroGLYCERIN 50 mg in dextrose 5 % 250 mL (0.2 mg/mL) infusion (0 mcg/min Intravenous Stopped 12/29/15 0006)  methylPREDNISolone sodium succinate (SOLU-MEDROL) 125 mg/2 mL injection 125 mg (125 mg Intravenous Given 12/28/15 1947)  ipratropium-albuterol (DUONEB) 0.5-2.5 (3) MG/3ML nebulizer solution 3 mL (3 mLs Nebulization Given 12/28/15 1939)  aspirin suppository 300 mg (300 mg Rectal Given 12/28/15 1952)  sodium chloride 0.9 % bolus 1,000 mL (0 mLs Intravenous Stopped 12/28/15 2212)  ceFEPIme (MAXIPIME) 2 g in dextrose 5 % 50 mL IVPB (0 g Intravenous Stopped 12/28/15 2141)  vancomycin (VANCOCIN) IVPB 1000 mg/200 mL premix (0 mg Intravenous Stopped 12/28/15 2241)     Initial Impression / Assessment and Plan / ED Course  I have reviewed the triage  vital signs and the nursing notes.  Pertinent labs & imaging results that were available during my care of the patient were reviewed by me and considered in my medical decision making (see chart for details).  Clinical Course    70 year old female past medical history of heart failure COPD reports today and severe shortness of breath. On arrival with a EMS she is on a nonrebreather getting a DuoNeb. I was her second DuoNeb. She is to Hypoxic and has diffuse end-expiratory wheezes. She put on BiPAP immediately. PH is 7.3 with a CO2 of 71. Chest x-ray shows a possible right  lower lobe pneumonia. Vanco dose ordered. EKG shows sinus tachycardia with baseline changes. No acute ischemic changes. Troponins negative. BNP is only 200. Her initial blood pressure is 200/100 at that time and nitroglycerin glycerin infusion is, however by the time is set up and ready her blood pressure is 140s over 70s no further need for hypertension control. The patient is DO NOT RESUSCITATE DO NOT INTUBATE and discussion with the family they are okay with admission to the hospital although previous note states that she would rather be at home. Patient agrees to this as well. Ultrasound IV is placed during resuscitation. Continuous DuoNeb's are given as well as IV steroids. Patient is admitted to the stepdown unit. Likely COPD exacerbation. Vital signs stable time and Dr. Clovis Riley this patient's care please see inpatient notes.  Final Clinical Impressions(s) / ED Diagnoses   Final diagnoses:  COPD exacerbation    New Prescriptions Current Discharge Medication List       Dewaine Conger, MD 12/29/15 [id number]    Gareth Morgan, MD 12/30/15 [phone]

## 2015-12-28 NOTE — ED Notes (Signed)
RT applied nasal canulla @ 4 lpm , O2 sat= 99% . Respirations unlabored .

## 2015-12-28 NOTE — Progress Notes (Signed)
Pharmacy Antibiotic Note  ALYNAH BRUNKHORST is a 70 y.o. female admitted on 12/28/2015 with pneumonia.  Pharmacy has been consulted for vancomycin and cefepime dosing. Wt  54 kg 11/13/15. WBC elevated 16.2 with ANC 12.9, creat 0.56, hypothermic, HR 112, RR 25. Prescribed Zpack 11/20. CXR: RLL PNA.   Plan: Vancomycin 1000 IV every 24 hours.  Goal trough 15-20 mcg/mL.  Cefepime 2 gm x1 then 1 gm IV q12h F/u renal fxn, wbc, temp, culture data Vancomycin trough as needed   Temp (24hrs), Avg:97.1 F (36.2 C), Min:96.8 F (36 C), Max:97.4 F (36.3 C)   Recent Labs Lab 12/28/15 1946  WBC 16.2*  CREATININE 0.56    CrCl cannot be calculated (Unknown ideal weight.).    Allergies  Allergen Reactions  . Doxycycline Anaphylaxis  . Codeine Other (See Comments)    hyperactivity  . Dulera [Mometasone Furo-Formoterol Fum] Hives and Rash  . Adhesive [Tape] Rash  . Latex Rash  . Lisinopril Cough  . Oxycodone Other (See Comments)    Zonked out on 10 mg,  Pt can tolerate 5 mg   Eudelia Bunch, Pharm.D. [id number] 12/28/2015 9:15 PM

## 2015-12-28 NOTE — Progress Notes (Signed)
   12/28/15 1930  BiPAP/CPAP/SIPAP  BiPAP/CPAP/SIPAP Pt Type Adult  Mask Type Full face mask  Mask Size Medium  Set Rate 12 breaths/min  Respiratory Rate 31 breaths/min  IPAP 14 cmH20  EPAP 6 cmH2O  Oxygen Percent 40 %  Flow Rate 0 lpm  Minute Ventilation 8.3  Leak 15  Peak Inspiratory Pressure (PIP) 15  Tidal Volume (Vt) 267  BiPAP/CPAP/SIPAP BiPAP  Patient Home Equipment No  Auto Titrate No  Press High Alarm 25 cmH2O  Press Low Alarm 5 cmH2O  BiPAP/CPAP /SiPAP Vitals  Pulse Rate (!) 122  Resp (!) 31  SpO2 99 %

## 2015-12-29 ENCOUNTER — Encounter (HOSPITAL_COMMUNITY): Payer: Self-pay | Admitting: Internal Medicine

## 2015-12-29 DIAGNOSIS — J9601 Acute respiratory failure with hypoxia: Secondary | ICD-10-CM | POA: Diagnosis present

## 2015-12-29 DIAGNOSIS — J189 Pneumonia, unspecified organism: Principal | ICD-10-CM

## 2015-12-29 DIAGNOSIS — J9621 Acute and chronic respiratory failure with hypoxia: Secondary | ICD-10-CM

## 2015-12-29 DIAGNOSIS — E118 Type 2 diabetes mellitus with unspecified complications: Secondary | ICD-10-CM

## 2015-12-29 DIAGNOSIS — J441 Chronic obstructive pulmonary disease with (acute) exacerbation: Secondary | ICD-10-CM

## 2015-12-29 LAB — CBC
HCT: 34.7 % — ABNORMAL LOW (ref 36.0–46.0)
Hemoglobin: 10.4 g/dL — ABNORMAL LOW (ref 12.0–15.0)
MCH: 27.2 pg (ref 26.0–34.0)
MCHC: 30 g/dL (ref 30.0–36.0)
MCV: 90.6 fL (ref 78.0–100.0)
Platelets: 384 K/uL (ref 150–400)
RBC: 3.83 MIL/uL — ABNORMAL LOW (ref 3.87–5.11)
RDW: 13.2 % (ref 11.5–15.5)
WBC: 10.2 K/uL (ref 4.0–10.5)

## 2015-12-29 LAB — BASIC METABOLIC PANEL
Anion gap: 8 (ref 5–15)
BUN: 18 mg/dL (ref 6–20)
CO2: 32 mmol/L (ref 22–32)
CREATININE: 0.56 mg/dL (ref 0.44–1.00)
Calcium: 8.2 mg/dL — ABNORMAL LOW (ref 8.9–10.3)
Chloride: 98 mmol/L — ABNORMAL LOW (ref 101–111)
Glucose, Bld: 193 mg/dL — ABNORMAL HIGH (ref 65–99)
POTASSIUM: 4.1 mmol/L (ref 3.5–5.1)
SODIUM: 138 mmol/L (ref 135–145)

## 2015-12-29 LAB — MRSA PCR SCREENING: MRSA by PCR: NEGATIVE

## 2015-12-29 LAB — TROPONIN I: Troponin I: 0.12 ng/mL

## 2015-12-29 MED ORDER — DEXTROSE 5 % IV SOLN
1.0000 g | Freq: Three times a day (TID) | INTRAVENOUS | Status: DC
  Administered 2015-12-29 – 2015-12-30 (×4): 1 g via INTRAVENOUS
  Filled 2015-12-29 (×6): qty 1

## 2015-12-29 MED ORDER — SODIUM CHLORIDE 0.9% FLUSH
3.0000 mL | Freq: Two times a day (BID) | INTRAVENOUS | Status: DC
  Administered 2015-12-29 (×3): 3 mL via INTRAVENOUS

## 2015-12-29 MED ORDER — DM-GUAIFENESIN ER 30-600 MG PO TB12
1.0000 | ORAL_TABLET | Freq: Two times a day (BID) | ORAL | Status: DC
  Administered 2015-12-29: 1 via ORAL
  Filled 2015-12-29: qty 1

## 2015-12-29 MED ORDER — DILTIAZEM HCL ER COATED BEADS 180 MG PO CP24
180.0000 mg | ORAL_CAPSULE | Freq: Every day | ORAL | Status: DC
  Administered 2015-12-29: 180 mg via ORAL
  Filled 2015-12-29: qty 1

## 2015-12-29 MED ORDER — VANCOMYCIN HCL IN DEXTROSE 1-5 GM/200ML-% IV SOLN
1000.0000 mg | INTRAVENOUS | Status: DC
  Administered 2015-12-29: 1000 mg via INTRAVENOUS
  Filled 2015-12-29 (×2): qty 200

## 2015-12-29 MED ORDER — LORAZEPAM 1 MG PO TABS
1.0000 mg | ORAL_TABLET | ORAL | Status: DC | PRN
  Administered 2015-12-29 (×2): 1 mg via ORAL
  Filled 2015-12-29 (×2): qty 1

## 2015-12-29 MED ORDER — ENOXAPARIN SODIUM 40 MG/0.4ML ~~LOC~~ SOLN
40.0000 mg | SUBCUTANEOUS | Status: DC
  Administered 2015-12-29: 40 mg via SUBCUTANEOUS
  Filled 2015-12-29: qty 0.4

## 2015-12-29 MED ORDER — ASPIRIN 325 MG PO TABS
325.0000 mg | ORAL_TABLET | Freq: Every day | ORAL | Status: DC
  Administered 2015-12-29: 325 mg via ORAL
  Filled 2015-12-29: qty 1

## 2015-12-29 MED ORDER — OXYCODONE HCL 5 MG PO TABS: 5.0000 mg | ORAL_TABLET | Freq: Four times a day (QID) | ORAL | Status: DC | PRN

## 2015-12-29 MED ORDER — HYDRALAZINE HCL 20 MG/ML IJ SOLN: 10.0000 mg | INTRAMUSCULAR | Status: DC | PRN

## 2015-12-29 MED ORDER — METHYLPREDNISOLONE SODIUM SUCC 40 MG IJ SOLR
40.0000 mg | Freq: Two times a day (BID) | INTRAMUSCULAR | Status: DC
  Administered 2015-12-29: 40 mg via INTRAVENOUS
  Filled 2015-12-29: qty 1

## 2015-12-29 MED ORDER — ACETAMINOPHEN 325 MG PO TABS: 650.0000 mg | ORAL_TABLET | Freq: Four times a day (QID) | ORAL | Status: DC | PRN

## 2015-12-29 MED ORDER — GUAIFENESIN ER 600 MG PO TB12
600.0000 mg | ORAL_TABLET | Freq: Two times a day (BID) | ORAL | Status: DC
  Administered 2015-12-29 (×2): 600 mg via ORAL
  Filled 2015-12-29 (×2): qty 1

## 2015-12-29 MED ORDER — MORPHINE SULFATE (PF) 2 MG/ML IV SOLN
2.0000 mg | INTRAVENOUS | Status: DC | PRN
  Administered 2015-12-30: 1 mg via INTRAVENOUS
  Administered 2015-12-30: 2 mg via INTRAVENOUS
  Filled 2015-12-29 (×2): qty 1

## 2015-12-29 MED ORDER — NITROGLYCERIN 0.4 MG SL SUBL: 0.4000 mg | SUBLINGUAL_TABLET | SUBLINGUAL | Status: DC | PRN

## 2015-12-29 MED ORDER — ACETAMINOPHEN 650 MG RE SUPP: 650.0000 mg | Freq: Four times a day (QID) | RECTAL | Status: DC | PRN

## 2015-12-29 MED ORDER — ORAL CARE MOUTH RINSE
15.0000 mL | Freq: Two times a day (BID) | OROMUCOSAL | Status: DC
  Administered 2015-12-29 – 2015-12-30 (×3): 15 mL via OROMUCOSAL

## 2015-12-29 MED ORDER — BUDESONIDE 0.5 MG/2ML IN SUSP
0.2500 mg | Freq: Two times a day (BID) | RESPIRATORY_TRACT | Status: DC
  Administered 2015-12-29 – 2015-12-30 (×4): 0.25 mg via RESPIRATORY_TRACT
  Filled 2015-12-29 (×4): qty 2

## 2015-12-29 MED ORDER — ONDANSETRON HCL 4 MG/2ML IJ SOLN
4.0000 mg | Freq: Four times a day (QID) | INTRAMUSCULAR | Status: DC | PRN
  Administered 2015-12-29: 4 mg via INTRAVENOUS
  Filled 2015-12-29: qty 2

## 2015-12-29 MED ORDER — ONDANSETRON HCL 4 MG PO TABS: 4.0000 mg | ORAL_TABLET | Freq: Four times a day (QID) | ORAL | Status: DC | PRN

## 2015-12-29 MED ORDER — FLUOXETINE HCL 10 MG PO CAPS
10.0000 mg | ORAL_CAPSULE | Freq: Every day | ORAL | Status: DC
  Administered 2015-12-29: 10 mg via ORAL
  Filled 2015-12-29 (×2): qty 1

## 2015-12-29 MED ORDER — IPRATROPIUM-ALBUTEROL 0.5-2.5 (3) MG/3ML IN SOLN: 3.0000 mL | RESPIRATORY_TRACT | Status: DC | PRN

## 2015-12-29 MED ORDER — CYANOCOBALAMIN 1000 MCG/ML IJ SOLN: 1000.0000 ug | INTRAMUSCULAR | Status: DC

## 2015-12-29 MED ORDER — MORPHINE SULFATE (PF) 2 MG/ML IV SOLN
2.0000 mg | INTRAVENOUS | Status: DC | PRN
  Administered 2015-12-29: 2 mg via INTRAVENOUS
  Filled 2015-12-29: qty 1

## 2015-12-29 MED ORDER — GABAPENTIN 100 MG PO CAPS: 100.0000 mg | ORAL_CAPSULE | Freq: Every evening | ORAL | Status: DC | PRN

## 2015-12-29 MED ORDER — IPRATROPIUM-ALBUTEROL 0.5-2.5 (3) MG/3ML IN SOLN
3.0000 mL | RESPIRATORY_TRACT | Status: DC
  Administered 2015-12-29 (×7): 3 mL via RESPIRATORY_TRACT
  Filled 2015-12-29 (×7): qty 3

## 2015-12-29 MED ORDER — LORAZEPAM 2 MG/ML IJ SOLN: 1.0000 mg | INTRAMUSCULAR | Status: DC | PRN

## 2015-12-29 MED ORDER — INSULIN GLARGINE 100 UNIT/ML ~~LOC~~ SOLN
25.0000 [IU] | Freq: Every day | SUBCUTANEOUS | Status: DC
  Administered 2015-12-29 – 2015-12-30 (×2): 25 [IU] via SUBCUTANEOUS
  Filled 2015-12-29 (×2): qty 0.25

## 2015-12-29 MED ORDER — METHYLPREDNISOLONE SODIUM SUCC 125 MG IJ SOLR
80.0000 mg | Freq: Four times a day (QID) | INTRAMUSCULAR | Status: DC
  Administered 2015-12-29 – 2015-12-30 (×4): 80 mg via INTRAVENOUS
  Filled 2015-12-29 (×5): qty 2

## 2015-12-29 NOTE — Progress Notes (Signed)
Patient unable to produce sputum specimen at this time. Sample cup left at bedside and RT will continue to monitor.

## 2015-12-29 NOTE — Progress Notes (Signed)
Called and notified Dr. Maryland Pink this evening that the patient was refusing bipap and chest vest. He spoke to the patient's granddaughter on the phone regarding patient's refusal. Afterwards, Dr. Maryland Pink told me that no bipap for this patient.

## 2015-12-29 NOTE — H&P (Signed)
History and Physical    Judith Boyer [id number] DOB: 1945-08-09 DOA: 12/28/2015  PCP: Reginia Forts, MD  Patient coming from: Home.  Chief Complaint: Shortness of breath.  HPI: Judith Boyer is a 70 y.o. 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 Patient states she has been getting short of breath over the last 1 week with productive cough and denies any chest pain. In the ER patient was found to be hypoxic and hypertensive and was placed on BiPAP. Patient also was wheezing. Chest x-ray shows right lower lobe pneumonia. Patient was initially placed on nitroglycerin infusion for blood pressure and started on antibiotics for pneumonia.  Patient was admitted last month for respiratory failure with pneumonia.   ED Course: Initially placed on BiPAP and nitroglycerin which has been at this time weaned off. Start on empiric antibiotics for healthcare associated pneumonia and nebulizer treatment for COPD.  Review of Systems: As per HPI, rest all negative.   Past Medical History:  Diagnosis Date  . Anemia   . COPD (chronic obstructive pulmonary disease) ([place])   . Coronary artery disease   . High cholesterol   . History of blood transfusion 2014   "extremely anemic"  . Hypertension   . Myocardial infarction 02/2011  . On home oxygen therapy    "2L; 24h/day" (12/20/2014)  . Pneumonia    "couple times" (02/08/2014)  . Shortness of breath   . Type II diabetes mellitus ([place])    type 2    Past Surgical History:  Procedure Laterality Date  . ABDOMINAL HYSTERECTOMY  1970's  . CARDIAC CATHETERIZATION  02/2011  . CORONARY ANGIOPLASTY WITH STENT PLACEMENT  12/2007   "2"  . LEFT HEART CATHETERIZATION WITH CORONARY ANGIOGRAM N/A 02/09/2011   Procedure: LEFT HEART CATHETERIZATION WITH CORONARY ANGIOGRAM;  Surgeon: Birdie Riddle, MD;  Location: [facility] CATH [facility];   Service: Cardiovascular;  Laterality: N/A;  . SHOULDER ARTHROSCOPY W/ ROTATOR CUFF REPAIR Left   . TONSILLECTOMY       reports that she quit smoking about 2 years ago. Her smoking use included Cigarettes. She has a 50.00 pack-year smoking history. She has never used smokeless tobacco. She reports that she does not drink alcohol or use drugs.  Allergies  Allergen Reactions  . Doxycycline Anaphylaxis  . Codeine Other (See Comments)    hyperactivity  . Dulera [Mometasone Furo-Formoterol Fum] Hives and Rash  . Adhesive [Tape] Rash  . Latex Rash  . Lisinopril Cough  . Oxycodone Other (See Comments)    Zonked out on 10 mg,  Pt can tolerate 5 mg    Family History  Problem Relation Age of Onset  . Lung cancer Mother     smoked  . Diabetes Mother   . Heart disease Maternal Grandmother   . Brain cancer Brother   . Diabetes Brother     Prior to Admission medications   Medication Sig Start Date End Date Taking? Authorizing Provider  ACCU-CHEK AVIVA PLUS test strip TEST twice a day Patient not taking: Reported on 11/13/2015 10/27/15   [name], MD  acetaminophen (TYLENOL) 325 MG tablet Take 2 tablets (650 mg total) by mouth every 6 (six) hours as needed for mild pain (or Fever >/= 101). Patient taking differently: Take 650 mg by mouth every 4 (four) hours as needed for mild pain (or Fever >/= 101).  12/31/14   Silver Huguenin  Elgergawy, MD  albuterol (PROAIR HFA) 108 (90 Base) MCG/ACT inhaler Inhale 2 puffs into the lungs every 4 (four) hours as needed for wheezing or shortness of breath. 11/19/15   Wardell Honour, MD  albuterol (PROVENTIL) (2.5 MG/3ML) 0.083% nebulizer solution Take 3 mLs (2.5 mg total) by nebulization every 4 (four) hours as needed for wheezing or shortness of breath. DX: 491.21 10/27/15   [name], MD  arformoterol (BROVANA) 15 MCG/2ML NEBU Take 2 mLs (15 mcg total) by nebulization 2 (two) times daily. Patient not taking: Reported on 11/13/2015 12/31/14   Silver Huguenin  Elgergawy, MD  bisacodyl (DULCOLAX) 10 MG suppository Place 1 suppository (10 mg total) rectally daily as needed for moderate constipation. Patient not taking: Reported on 11/13/2015 12/31/14   Silver Huguenin Elgergawy, MD  budesonide (PULMICORT) 0.5 MG/2ML nebulizer solution Take 2 mLs (0.5 mg total) by nebulization 2 (two) times daily. Patient not taking: Reported on 11/13/2015 08/02/15   [name], MD  dextromethorphan-guaiFENesin [facility] DM) 30-600 MG 12hr tablet Take 1 tablet by mouth 2 (two) times daily. 11/17/15   [name], MD  diltiazem (CARDIZEM CD) 180 MG 24 hr capsule Take 1 capsule (180 mg total) by mouth daily. Patient not taking: Reported on 11/13/2015 03/16/15   [name], MD  feeding supplement, GLUCERNA SHAKE, (GLUCERNA SHAKE) LIQD Take 237 mLs by mouth 3 (three) times daily between meals. Patient not taking: Reported on 11/13/2015 12/31/14   Silver Huguenin Elgergawy, MD  FLUoxetine (PROZAC) 10 MG capsule Take 10 mg by mouth daily.    Historical Provider, MD  gabapentin (NEURONTIN) 100 MG capsule Take 1 tablet at night  For restless leg syndrome. May increase to 2 tablets at night after 3-4 days if no improvement Patient taking differently: Take 100-200 mg by mouth See admin instructions. Take 100 mg by mouth at night for restless leg syndrome. May increase to 200 mg at night after 3-4 days if no improvement. 10/28/15   [name], MD  insulin aspart (NOVOLOG) 100 UNIT/ML injection Before each meal 3 times a day, 140-199 - 2 units, 200-250 - 4 units, 251-299 - 6 units,  300-349 - 8 units,  350 or above 10 units. Dispense syringes and needles as needed, Ok to switch to PEN if approved. Substitute to any brand approved. DX DM2, Code E11.65 Patient taking differently: Inject 2-16 Units into the skin See admin instructions. Inject 16 units subque 3 times daily with meals or sliding scale , 140-199 - 2 units, 200-250 - 4 units, 251-299 - 6 units,  300-349 - 8 units,  350 or above 10 units.  Dispense syringes and needles as needed, Ok to switch to PEN if approved. Substitute to any brand approved. DX DM2, Code E11.65 07/06/15   [name], MD  insulin glargine (LANTUS) 100 UNIT/ML injection INJECT 25 UNITS INTO SKIN EVERY MORNING 07/06/15   [name], MD  Insulin Syringe-Needle U-100 (RELI-ON INS SYR .5CC/30G) 30G 0.5 ML MISC Use as directed with insulin up to 3 times a day.  DX:  E11.9.  Please use short needles Patient not taking: Reported on 11/13/2015 09/12/15   [name], MD  ipratropium-albuterol (DUONEB) 0.5-2.5 (3) MG/3ML SOLN Take 3 mLs by nebulization 4 (four) times daily - after meals and at bedtime. 10/27/15   [name], MD  levofloxacin (LEVAQUIN) 750 MG tablet Take 1 tablet (750 mg total) by mouth daily. 11/18/15   [name], MD  LORazepam (ATIVAN)  1 MG tablet Take 1 tablet every 4 hours as needed Patient taking differently: Take 1 mg by mouth every 4 (four) hours as needed for anxiety.  08/04/15   [name], MD  Melatonin 3 MG TABS Take 3 mg by mouth at bedtime. Reported on 04/03/2015    Historical Provider, MD  montelukast (SINGULAIR) 10 MG tablet take 1 tablet by mouth once daily Patient not taking: Reported on 11/13/2015 07/09/14   [name], MD  nitroGLYCERIN (NITROSTAT) 0.4 MG SL tablet Place 1 tablet (0.4 mg total) under the tongue every 5 (five) minutes x 3 doses as needed for chest pain. 02/07/12   Dixie Dials, MD  ondansetron (ZOFRAN) 4 MG tablet Take 4 mg by mouth every 6 (six) hours as needed for nausea or vomiting.    Historical Provider, MD  oxyCODONE (ROXICODONE) 5 MG immediate release tablet Take 1 tablet every 2-4 hours as needed for respiratory distress. (no more than 12 tabs in 24 hours) 10/28/15   [name], MD  OXYGEN Inhale 3 L into the lungs continuous.     Historical Provider, MD  predniSONE (DELTASONE) 20 MG tablet 20 mg daily 12/07/15   Wardell Honour, MD  senna-docusate (SENOKOT-S) 8.6-50 MG tablet Take 2 tablets by mouth at  bedtime. Patient not taking: Reported on 11/13/2015 12/31/14   Silver Huguenin Elgergawy, MD  sodium chloride (OCEAN) 0.65 % SOLN nasal spray Place 1 spray into both nostrils as needed for congestion. Patient not taking: Reported on 11/13/2015 12/31/14   Albertine Patricia, MD    Physical Exam: Vitals:   12/28/15 2315 12/28/15 2330 12/28/15 2345 12/29/15 0000  BP: 132/70 126/62 125/64 112/61  Pulse: 103 102 101 102  Resp: 23 22 22 21   Temp:      TempSrc:      SpO2: 100% 99% 99% 100%      Constitutional: Poorly built and nourished. Vitals:   12/28/15 2315 12/28/15 2330 12/28/15 2345 12/29/15 0000  BP: 132/70 126/62 125/64 112/61  Pulse: 103 102 101 102  Resp: 23 22 22 21   Temp:      TempSrc:      SpO2: 100% 99% 99% 100%   Eyes: Anicteric. No pallor. ENMT: No discharge from the ears eyes nose and mouth. Neck: No mass felt. No JVD appreciated. Respiratory: Bilateral wheezing heard no crepitations. Cardiovascular: S1 and S2 heard. No murmurs appreciated. Abdomen: Soft nontender bowel sounds present. Musculoskeletal: No edema. Skin: No rash. Neurologic: Alert awake oriented to time place and person. Moves all extremities. Psychiatric: Appears normal.   Labs on Admission: I have personally reviewed following labs and imaging studies  CBC:  Recent Labs Lab 12/28/15 1946  WBC 16.2*  NEUTROABS 12.9*  HGB 11.7*  HCT 38.3  MCV 90.5  PLT 123456*   Basic Metabolic Panel:  Recent Labs Lab 12/28/15 1946  NA 138  K 3.6  CL 92*  CO2 35*  GLUCOSE 125*  BUN 17  CREATININE 0.56  CALCIUM 8.9   GFR: CrCl cannot be calculated (Unknown ideal weight.). Liver Function Tests:  Recent Labs Lab 12/28/15 1946  AST 32  ALT 22  ALKPHOS 99  BILITOT 0.1*  PROT 6.7  ALBUMIN 2.8*   No results for input(s): LIPASE, AMYLASE in the last 168 hours. No results for input(s): AMMONIA in the last 168 hours. Coagulation Profile: No results for input(s): INR, PROTIME in the last 168  hours. Cardiac Enzymes: No results for input(s): CKTOTAL, CKMB, CKMBINDEX, TROPONINI  in the last 168 hours. BNP (last 3 results) No results for input(s): PROBNP in the last 8760 hours. HbA1C: No results for input(s): HGBA1C in the last 72 hours. CBG:  Recent Labs Lab 12/28/15 1928  GLUCAP 123*   Lipid Profile: No results for input(s): CHOL, HDL, LDLCALC, TRIG, CHOLHDL, LDLDIRECT in the last 72 hours. Thyroid Function Tests: No results for input(s): TSH, T4TOTAL, FREET4, T3FREE, THYROIDAB in the last 72 hours. Anemia Panel: No results for input(s): VITAMINB12, FOLATE, FERRITIN, TIBC, IRON, RETICCTPCT in the last 72 hours. Urine analysis:    Component Value Date/Time   COLORURINE AMBER (A) 12/28/2015 2219   APPEARANCEUR CLOUDY (A) 12/28/2015 2219   LABSPEC 1.025 12/28/2015 2219   PHURINE 6.0 12/28/2015 2219   GLUCOSEU 250 (A) 12/28/2015 2219   HGBUR NEGATIVE 12/28/2015 2219   BILIRUBINUR NEGATIVE 12/28/2015 2219   BILIRUBINUR neg 05/01/2014 1253   KETONESUR 15 (A) 12/28/2015 2219   PROTEINUR 100 (A) 12/28/2015 2219   UROBILINOGEN 0.2 05/01/2014 1253   NITRITE NEGATIVE 12/28/2015 2219   LEUKOCYTESUR SMALL (A) 12/28/2015 2219   Sepsis Labs: @LABRCNTIP (procalcitonin:4,lacticidven:4) )No results found for this or any previous visit (from the past 240 hour(s)).   Radiological Exams on Admission: Dg Chest Portable 1 View  Result Date: 12/28/2015 CLINICAL DATA:  Patient presents with EMS from home reports worsening SOB with dry cough/wheezing for several days . EXAM: PORTABLE CHEST 1 VIEW COMPARISON:  11/16/2015 FINDINGS: Normal cardiac silhouette. New RIGHT lower lobe pneumonia. Lungs are hyperinflated. IMPRESSION: RIGHT lower lobe pneumonia. Electronically Signed   By: Suzy Bouchard M.D.   On: 12/28/2015 19:50    EKG: Independently reviewed. Sinus tachycardia.  Assessment/Plan Active Problems:   Hypertension   COPD exacerbation (HCC)   B12 deficiency   Coronary  artery disease involving native coronary artery of native heart without angina pectoris   Diabetes mellitus with complication (HCC)   Acute on chronic respiratory failure with hypoxia (HCC)   Healthcare-associated pneumonia   Acute respiratory failure with hypoxia ([place])    1. Acute on chronic respiratory failure - probably a combination of COPD and pneumonia. Patient is placed on vancomycin and cefepime for pneumonia and on Solu-Medrol nebulizer and Pulmicort for COPD. Closely follow respiratory status. Patient is a DO NOT RESUSCITATE. 2. Hypertension - patient is on Cardizem and I have placed patient on when necessary IV hydralazine. Follow blood pressure trends. 3. CAD status post stenting - in 2013. Had stress tests in 2014 which was unremarkable. Denies any chest pain. Continue Cardizem I have placed patient on aspirin. Check troponin. 4. Diabetes mellitus type 2 - on Lantus 25 units daily. I have also place patient on sliding scale coverage. 5. B 12 deficiency on replacement.   DVT prophylaxis: Lovenox. Code Status: DO NOT RESUSCITATE.  Family Communication: Discussed with patient.  Disposition Plan: Home.  Consults called: None.  Admission status: Inpatient. Likely stay 2-3 days.    Rise Patience MD [facility] Pager [phone].  If 7PM-7AM, please contact night-coverage [email or web address] Password [email or web address]  12/29/2015, 12:20 AM

## 2015-12-29 NOTE — Progress Notes (Signed)
RN GIP Visit - [place], [place]  Patient transferred to [facility] ED via EMS on 12/28/15 from her home with coughing, dyspnea, and chest pain per patient and family preference.  She admitted to [facility] #[id number] on 12/29/15.  Per hospice physician, Dr. Konrad Dolores, this is a related and covered hospitalization.  Dr. Reginia Forts was updated as well.  Patient is a DNR upon admission.  12:17pm Visit at patient's bedside.  She is resting on her side in fetal position.  Bipap mask in place and occasional congested cough noted.  Hospital nurse Verdis Frederickson reports that patient desaturated into the 50's this morning following using the bedpan.  Currently patient is lethargic and very pale.  Telemetry in use.  HR 107.  BP fluctuating over past couple of hours from 213/90 to 127/56.  Nurse reports that Bipap and injectable Ativan have been helpful in alleviating anxiety and calming increased work of breathing.  Per chart review, plan is for patient to remain in the hospital over the next few days.  PPS:  30% Pain:  Patient too lethargic to report Plan of care:  Return home with hospice support when condition is more stable  An [facility] team member will continue to visit patient daily during this hospitalization.  Please call for any questions/needs.  Thank you, Moss Mc, RN , BSN, [facility] Director of [place] liaisons are now on [place].  Please feel free to contact us directly or to call [facility] main number at [phone].

## 2015-12-29 NOTE — Progress Notes (Addendum)
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 She was found to have right lower lobe pneumonia. She was also thought to have acute COPD exacerbation. Patient was hospitalized for further management.  Reason for Visit: Acute COPD exacerbation and HCAP  Consultants: None  Procedures: None  Antibiotics: Vancomycin and cefepime  Subjective/Interval History: Patient complains of shortness of breath this morning. Denies any chest pain. Has had a cough with greenish expectoration. During the course of the morning patient's respiratory status got worse, especially after she had to use a bedpan. And at that point in time, she was placed on BiPAP.  ROS: Denies any nausea or vomiting  Objective:  Vital Signs  Vitals:   12/29/15 1024 12/29/15 1033 12/29/15 1126 12/29/15 1130  BP: (!) 175/78 (!) 177/81 (!) 133/59 (!) 133/54  Pulse: (!) 135 (!) 133 (!) 118 (!) 117  Resp: (!) 28 (!) 25 (!) 26 (!) 22  Temp:   98.1 F (36.7 C)   TempSrc:   Axillary   SpO2: 100% 100% 98% 98%  Weight:      Height:        Intake/Output Summary (Last 24 hours) at 12/29/15 1139 Last data filed at 12/29/15 0500  Gross per 24 hour  Intake             1250 ml  Output              275 ml  Net              975 ml   Filed Weights   12/29/15 0100  Weight: 53.8 kg (118 lb 9.7 oz)    General appearance: alert, cooperative, appears stated age and moderate distress Resp: Patient noted to be tachypneic. Using accessory muscles. Pursed lips. Very limited air entry bilaterally with a few wheezes. Crackles at the bases. No rhonchi. Cardio: S1, S2 is  tachycardic. Regular. No S3, S4. No rubs, murmurs, or bruit. No pedal edema. GI: soft, non-tender; bowel sounds normal; no masses,  no organomegaly Extremities: extremities normal, atraumatic, no cyanosis or edema Neurologic: Awake and alert. Oriented 3. No focal neurological deficits.  Lab Results:  Data Reviewed: I have personally reviewed following labs and imaging studies  CBC:  Recent Labs Lab 12/28/15 1946 12/29/15 0249  WBC 16.2* 10.2  NEUTROABS 12.9*  --   HGB 11.7* 10.4*  HCT 38.3 34.7*  MCV 90.5 90.6  PLT 479* 0000000    Basic Metabolic Panel:  Recent Labs Lab 12/28/15 1946 12/29/15 0249  NA 138 138  K 3.6 4.1  CL 92* 98*  CO2 35* 32  GLUCOSE 125* 193*  BUN 17 18  CREATININE 0.56 0.56  CALCIUM 8.9 8.2*    GFR: Estimated Creatinine Clearance: 48.7 mL/min (by C-G formula based on SCr of 0.56 mg/dL).  Liver Function Tests:  Recent Labs Lab 12/28/15 1946  AST 32  ALT 22  ALKPHOS 99  BILITOT 0.1*  PROT 6.7  ALBUMIN 2.8*    Cardiac Enzymes:  Recent Labs Lab 12/29/15 0649  TROPONINI 0.12*    CBG:  Recent Labs Lab 12/28/15 1928  GLUCAP 123*     Recent Results (from the past 240 hour(s))  MRSA PCR Screening  Status: None   Collection Time: 12/29/15  1:14 AM  Result Value Ref Range Status   MRSA by PCR NEGATIVE NEGATIVE Final    Comment:        The GeneXpert MRSA Assay (FDA approved for NASAL specimens only), is one component of a comprehensive MRSA colonization surveillance program. It is not intended to diagnose MRSA infection nor to guide or monitor treatment for MRSA infections.       Radiology Studies: Dg Chest Portable 1 View  Result Date: 12/28/2015 CLINICAL DATA:  Patient presents with EMS from home reports worsening SOB with dry cough/wheezing for several days . EXAM: PORTABLE CHEST 1 VIEW COMPARISON:  11/16/2015 FINDINGS: Normal cardiac silhouette. New RIGHT lower lobe pneumonia. Lungs are hyperinflated.  IMPRESSION: RIGHT lower lobe pneumonia. Electronically Signed   By: Suzy Bouchard M.D.   On: 12/28/2015 19:50     Medications:  Scheduled: . aspirin  325 mg Oral Daily  . budesonide (PULMICORT) nebulizer solution  0.25 mg Nebulization BID  . ceFEPime (MAXIPIME) IV  1 g Intravenous Q8H  . [START ON 01/23/2016] cyanocobalamin  1,000 mcg Intramuscular Q30 days  . diltiazem  180 mg Oral Daily  . enoxaparin (LOVENOX) injection  40 mg Subcutaneous Q24H  . FLUoxetine  10 mg Oral Daily  . guaiFENesin  600 mg Oral BID  . insulin glargine  25 Units Subcutaneous Daily  . ipratropium-albuterol  3 mL Nebulization Q4H  . methylPREDNISolone (SOLU-MEDROL) injection  80 mg Intravenous Q6H  . sodium chloride flush  3 mL Intravenous Q12H  . vancomycin  1,000 mg Intravenous Q24H   Continuous:  [id number] **OR** acetaminophen, gabapentin, hydrALAZINE, ipratropium-albuterol, LORazepam, nitroGLYCERIN, ondansetron **OR** ondansetron (ZOFRAN) IV, oxyCODONE  Assessment/Plan:  Active Problems:   Hypertension   COPD exacerbation ([place])   B12 deficiency   Coronary artery disease involving native coronary artery of native heart without angina pectoris   Diabetes mellitus with complication (HCC)   Acute on chronic respiratory failure with hypoxia (HCC)   Healthcare-associated pneumonia   Acute respiratory failure with hypoxia (HCC)    Acute on chronic respiratory failure  with hypoxia Probably secondary to a combination of COPD and pneumonia. Patient's ABG showed hypercapnia, but pH is in normal range. Patient is in moderate respiratory distress this morning. Placed on BiPAP. She is DO NOT RESUSCITATE/DO NOT INTUBATE. Continue to monitor in stepdown setting for now.  Acute COPD exacerbation. Patient appears to be worse this morning. We will increase the dose of her systemic steroids. Continue with the nebulized steroids as well as nebulizer treatments with albuterol and Atrovent.  Healthcare  associated pneumonia Right lower lobe pneumonia identified on chest x-ray. Continue with vancomycin and cefepime for now. Follow up on cultures.  Essential Hypertension Blood pressure was extremely elevated earlier today, likely secondary to her respiratory distress. As her respiratory status improves, blood pressure should improve as well. Also noted to be tachycardic. These will be monitored closely.  CAD status post stenting in 2013 Had stress tests in 2014 which was unremarkable. Patient is noted to have mildly elevated troponin. Significance of this is unclear. EKG does not show any ischemic changes. Patient denies any chest pain. Monitor for now. Could be suggestive more of demand ischemia rather than a true ACS.  Diabetes mellitus type 2  Continue Lantus and sliding scale insulin coverage. Anticipate some worsening of her glycemic control due to steroids.  History of B 12 deficiency On replacement.  ADDENDUM Patient is not improving. She is getting tired  and exhausted. She does not want to use BiPAP anymore. Her granddaughter came in to talk to the patient. Patient apparently is now tired of her condition. She wants to be comfortable for the most part. Will continue medical treatment. If she doesn't want to use BiPAP, ok to leave it off for now. She understands that this may result in worsening respiratory status, which she accepts. We will readdress the issue tomorrow morning. Patient and granddaughter understands that patient is at risk for dying tonight.  DVT Prophylaxis: Lovenox    Code Status: DO NOT RESUSCITATE  Family Communication: Discussed with patient  Disposition Plan: Management as outlined above. Prognosis is guarded.    LOS: 1 day   [place] [facility] Pager [phone] 12/29/2015, 11:39 AM  If 7PM-7AM, please contact night-coverage at [email or web address], password [facility]

## 2015-12-29 NOTE — ED Notes (Signed)
Dr. Hal Hope discontinued NTG drip and NTG sl prn.

## 2015-12-29 NOTE — Progress Notes (Signed)
Patient unable to perform after neb treatment. Very SOB.  RN made aware.

## 2015-12-29 NOTE — Progress Notes (Signed)
[id number]- Paged Dr. Maryland Pink about the troponin of 0.12. Currently at bedside assessing the patient and aware of the troponin level.

## 2015-12-29 NOTE — Progress Notes (Signed)
Patient very SOB with labored breathing but is refusing the BiPAP. She says she will not go back on it at this time even after explaining that it will help with her work of breathing/SOB She says she is miserable. Patient also refusing CPT. RN notified to notify MD of possible comfort measures due to refusal.

## 2015-12-29 NOTE — Progress Notes (Signed)
Patient desat to 59's after getting her on and off from the bedpan. Elevated HR, BP and RR. Called RT Arboriculturist. Put patient on Partial non-rebreather.O2 sat improves, 100%, still elevated HR and RR. Called and notified Dr. [place] Pink. He is planning to put on bipap if doesn't improve. Rt aware.

## 2015-12-30 ENCOUNTER — Telehealth: Payer: Self-pay

## 2015-12-30 LAB — BASIC METABOLIC PANEL
ANION GAP: 8 (ref 5–15)
BUN: 20 mg/dL (ref 6–20)
CALCIUM: 8.2 mg/dL — AB (ref 8.9–10.3)
CHLORIDE: 97 mmol/L — AB (ref 101–111)
CO2: 31 mmol/L (ref 22–32)
Creatinine, Ser: 0.51 mg/dL (ref 0.44–1.00)
GFR calc Af Amer: >60 mL/min (ref >60)
GFR calc non Af Amer: >60 mL/min (ref >60)
GLUCOSE: 288 mg/dL — AB (ref 65–99)
POTASSIUM: 5.5 mmol/L — AB (ref 3.5–5.1)
Sodium: 136 mmol/L (ref 135–145)

## 2015-12-30 LAB — BLOOD CULTURE ID PANEL (REFLEXED)
Acinetobacter baumannii: NOT DETECTED
CANDIDA ALBICANS: NOT DETECTED
CANDIDA GLABRATA: NOT DETECTED
CANDIDA KRUSEI: NOT DETECTED
CANDIDA PARAPSILOSIS: NOT DETECTED
CANDIDA TROPICALIS: NOT DETECTED
ENTEROBACTER CLOACAE COMPLEX: NOT DETECTED
ESCHERICHIA COLI: NOT DETECTED
Enterobacteriaceae species: NOT DETECTED
Enterococcus species: NOT DETECTED
Haemophilus influenzae: NOT DETECTED
KLEBSIELLA OXYTOCA: NOT DETECTED
KLEBSIELLA PNEUMONIAE: NOT DETECTED
Listeria monocytogenes: NOT DETECTED
Neisseria meningitidis: NOT DETECTED
PROTEUS SPECIES: NOT DETECTED
Pseudomonas aeruginosa: NOT DETECTED
STAPHYLOCOCCUS SPECIES: NOT DETECTED
STREPTOCOCCUS AGALACTIAE: NOT DETECTED
STREPTOCOCCUS PNEUMONIAE: NOT DETECTED
STREPTOCOCCUS PYOGENES: NOT DETECTED
STREPTOCOCCUS SPECIES: NOT DETECTED
Serratia marcescens: NOT DETECTED
Staphylococcus aureus (BCID): NOT DETECTED

## 2015-12-30 LAB — CBC
HEMATOCRIT: 31.4 % — AB (ref 36.0–46.0)
HEMOGLOBIN: 9.6 g/dL — AB (ref 12.0–15.0)
MCH: 27.6 pg (ref 26.0–34.0)
MCHC: 30.6 g/dL (ref 30.0–36.0)
MCV: 90.2 fL (ref 78.0–100.0)
Platelets: 405 10*3/uL — ABNORMAL HIGH (ref 150–400)
RBC: 3.48 MIL/uL — ABNORMAL LOW (ref 3.87–5.11)
RDW: 13.5 % (ref 11.5–15.5)
WBC: 12.2 10*3/uL — AB (ref 4.0–10.5)

## 2015-12-30 LAB — TROPONIN I: Troponin I: 0.42 ng/mL (ref <0.03)

## 2015-12-30 LAB — GLUCOSE, CAPILLARY: Glucose-Capillary: 327 mg/dL — ABNORMAL HIGH (ref 65–99)

## 2015-12-30 MED ORDER — IPRATROPIUM-ALBUTEROL 0.5-2.5 (3) MG/3ML IN SOLN
3.0000 mL | Freq: Four times a day (QID) | RESPIRATORY_TRACT | Status: DC
  Administered 2015-12-30 (×2): 3 mL via RESPIRATORY_TRACT
  Filled 2015-12-30 (×2): qty 3

## 2015-12-30 NOTE — Progress Notes (Signed)
[facility] Work note LCSW met with patient son and daughter-in-law at the bedside. Patient did not open her eyes or respond, yet appeared to have much difficulty breathing and sats were low. Nsg staff changed to mask and patient appeared much more calm, breathing was eased. Family are interested in [facility] for care as they cannot manage care at home. [facility] RN, Levada Dy was in the room and offered bed for today which was accepted. Family are awaiting transfer there today. LCSW offered support, education. Katherina Right, [place]

## 2015-12-30 NOTE — Telephone Encounter (Signed)
Angela from Hospice called and and stated patient is fast declining and is being transferred to [facility] today.  She is not expected to make it very long.

## 2015-12-30 NOTE — Progress Notes (Signed)
Pt sats dropped to 40's to 2's Niece said pt did not want the mask anymore - miscommunication with family - they were thinking Bipap not Venti-mask. Venti mask applied and sats back up into 90's. RR was in the 40's prior to 2 mg of Morphine Still able to arouse pt but very drowsy. Frequent oral care & peri care.

## 2015-12-30 NOTE — Care Management Note (Signed)
Case Management Note  Patient Details  Name: Judith Boyer MRN: [id number] Date of Birth: March 01, 1945  Subjective/Objective:   Pt is active with Hospice and [place].  Per Hospice liaison, family's choice is to transfer her to [facility].  CSW notified.                          Expected Discharge Plan:  [place]  In-House Referral:  Clinical Social Work  Discharge planning Services  CM Consult  Post Acute Care Choice:  Hospice  Status of Service:  Completed, signed off  Girard Cooter, [place] 12/30/2015, 11:02 AM

## 2015-12-30 NOTE — Consult Note (Signed)
   [facility] CM Inpatient Consult   12/30/2015  JUMANA HARTWIG 02-Aug-1945 [id number]   Patient screened for potential [place] Management services for admission. Patient's chart reveals that she is under Hospice Care and family is seeking Residential Hospice. No [facility] Care Management needs. Sign off.  For questions contact:   Natividad Brood, RN BSN [facility] Liaison  [phone] business mobile phone Toll free office [phone]

## 2015-12-30 NOTE — Progress Notes (Signed)
Called report to [place] [facility] - spoke to [employer]

## 2015-12-30 NOTE — Progress Notes (Signed)
RN GIP Visit - [place], [place]  Patient transferred to [facility] ED via EMS on 12/28/15 from her home with coughing, dyspnea, and chest pain per patient and family preference.  She admitted to [place] #[id number] on 12/29/15.  Per hospice physician, Dr. Konrad Dolores, this is a related and covered hospitalization.  Dr. Reginia Forts was updated as well.  Patient is a DNR upon admission.  9:05am Visit at patient's bedside. Patient in minimally responsive.  Breathing is labored with saturations in the 40's and 50's.  Patient is pale and has sallow appearance.  Son at bedside reports that patient voiced earlier wanting to "go home" and being very "tired."  Patient wanted Bipap removed and does not want it replaced.  Family agrees that patient is declining rapidly and want her to be comfortable.  PPS:  10% Pain:  Patient too lethargic to report Plan of care:  Family desires transport to [name]; bed available for today; awaiting confirmation from hospital team  Thank you, Moss Mc, RN , BSN, [facility] Director of [place] liaisons are now on [place].  Please feel free to contact us directly or to call [facility] main number at [phone].  I can be reached today at [phone].

## 2015-12-30 NOTE — Progress Notes (Signed)
PHARMACY - PHYSICIAN COMMUNICATION CRITICAL VALUE ALERT - BLOOD CULTURE IDENTIFICATION (BCID)  Results for orders placed or performed during the hospital encounter of 12/28/15  Blood Culture ID Panel (Reflexed) (Collected: 12/28/2015  9:15 PM)  Result Value Ref Range   Enterococcus species NOT DETECTED NOT DETECTED   Listeria monocytogenes NOT DETECTED NOT DETECTED   Staphylococcus species NOT DETECTED NOT DETECTED   Staphylococcus aureus NOT DETECTED NOT DETECTED   Streptococcus species NOT DETECTED NOT DETECTED   Streptococcus agalactiae NOT DETECTED NOT DETECTED   Streptococcus pneumoniae NOT DETECTED NOT DETECTED   Streptococcus pyogenes NOT DETECTED NOT DETECTED   Acinetobacter baumannii NOT DETECTED NOT DETECTED   Enterobacteriaceae species NOT DETECTED NOT DETECTED   Enterobacter cloacae complex NOT DETECTED NOT DETECTED   Escherichia coli NOT DETECTED NOT DETECTED   Klebsiella oxytoca NOT DETECTED NOT DETECTED   Klebsiella pneumoniae NOT DETECTED NOT DETECTED   Proteus species NOT DETECTED NOT DETECTED   Serratia marcescens NOT DETECTED NOT DETECTED   Haemophilus influenzae NOT DETECTED NOT DETECTED   Neisseria meningitidis NOT DETECTED NOT DETECTED   Pseudomonas aeruginosa NOT DETECTED NOT DETECTED   Candida albicans NOT DETECTED NOT DETECTED   Candida glabrata NOT DETECTED NOT DETECTED   Candida krusei NOT DETECTED NOT DETECTED   Candida parapsilosis NOT DETECTED NOT DETECTED   Candida tropicalis NOT DETECTED NOT DETECTED    Name of physician (or Provider) Contacted:   N/A   Changes to prescribed antibiotics required:   None.  Currently on Vancomycin and Cefepime   Caryl Pina 12/30/2015  1:48 AM

## 2015-12-30 NOTE — Clinical Social Work Note (Signed)
CSW received consult for residential hospice. Pt is a current [facility] pt. Per RN with [facility], pt has bed at [facility] which available today. MD aware and discharged pt. Pt's son is aware and agreeable to discharge plan. RN to call report. [facility] will provide transportation (per contract). CSW is signing off as no further needs identified.   Darden Dates, MSW, LCSW  Clinical Social Worker  [phone]

## 2015-12-30 NOTE — Progress Notes (Signed)
Pt discharged per ambulance to [facility]  with belongings Niece aware of discharge Pt given 1 Mg or MS prior to d/c was scheduled to receive 2 mg but niece thought that 2 mg was too much like she received this am. Pt on 45 % 02 on 10 L per venti- mask- Sats 95%

## 2015-12-30 NOTE — Discharge Summary (Signed)
[facility]  Physician Discharge Summary   Patient ID: Judith Boyer MRN: [id number] DOB/AGE: 1945/04/16 70 y.o.  Admit date: 12/28/2015 Discharge date: 12/30/2015  PCP: Reginia Forts, MD  DISCHARGE DIAGNOSES:  Active Problems:   Hypertension   COPD exacerbation ([place])   B12 deficiency   Coronary artery disease involving native coronary artery of native heart without angina pectoris   Diabetes mellitus with complication (HCC)   Acute on chronic respiratory failure with hypoxia (HCC)   Healthcare-associated pneumonia   Acute respiratory failure with hypoxia (HCC)   RECOMMENDATIONS FOR OUTPATIENT FOLLOW UP: 1. Patient to be discharged to residential hospice.   DISCHARGE CONDITION: poor  Diet recommendation: comfort feeds as tolerated.  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 She was found to have right lower lobe pneumonia. She was also thought to have acute COPD exacerbation. Patient was hospitalized for further management.   HOSPITAL COURSE:   Acute on chronic respiratory failure with hypoxia Probably secondary to a combination of COPD and pneumonia. Patient's ABG showed hypercapnia, but pH is in normal range. Patient's condition continued to deteriorate. She was placed on Bipap with some improvement. But then patient started feeling poorly again. She did not want to wear the bipap. She wanted to be left alone. Discussed with family and her family including son and granddaughter. They will like to proceed with comfort care and hospice. Will stop antibiotics. There is a bed available at residential hospice today. Patient can be  discharged. There is risk for decompensation en route but currenytly appears to be stable on a venti mask.   Acute COPD exacerbation. See above. She was given nebulized steroids as well as nebulizer treatments with albuterol and Atrovent. Also given systemic steroids.   Healthcare associated pneumonia Right lower lobe pneumonia identified on chest x-ray. She was started on vancomycin and cefepime. Can stop as she is now comfort care.   Essential Hypertension Blood pressure was extremely elevated likely secondary to her respiratory distress. Also noted to be tachycardic. Potassium level high due to hemolysis.  CAD status post stenting in 2013 Had stress tests in 2014 which was unremarkable. Patient is noted to have mildly elevated troponin. Significance of this is unclear. EKG does not show any ischemic changes. Patient denies any chest pain. Could be suggestive more of demand ischemia rather than a true ACS.  Diabetes mellitus type 2 Continue Lantus and sliding scale insulin coverage. Anticipate some worsening of her glycemic control due to steroids.  History of B 12 deficiency On replacement.  Patient has poor prognosis. Life expectancy is thought to be a few days at best. [facility] for discharge to residential hospice.   PERTINENT LABS:  The results of significant diagnostics from this hospitalization (including imaging, microbiology, ancillary and laboratory) are listed below for reference.    Microbiology: Recent Results (from the past 240 hour(s))  Blood culture (routine x 2)     Status: None (Preliminary result)   Collection Time: 12/28/15  9:15 PM  Result Value Ref Range Status   Specimen Description BLOOD RIGHT HAND  Final   Special Requests IN PEDIATRIC BOTTLE [facility]  Final   Culture  Setup Time   Final  GRAM POSITIVE COCCI IN CLUSTERS IN PEDIATRIC BOTTLE CRITICAL RESULT CALLED TO, READ BACK BY AND VERIFIED WITH: G. Abbott Pharm.D. 01:45 12/30/15 ([email or web address])    Culture    Final    GRAM POSITIVE COCCI CULTURE REINCUBATED FOR BETTER GROWTH    Report Status PENDING  Incomplete  Blood Culture ID Panel (Reflexed)     Status: None   Collection Time: 12/28/15  9:15 PM  Result Value Ref Range Status   Enterococcus species NOT DETECTED NOT DETECTED Final   Listeria monocytogenes NOT DETECTED NOT DETECTED Final   Staphylococcus species NOT DETECTED NOT DETECTED Final   Staphylococcus aureus NOT DETECTED NOT DETECTED Final   Streptococcus species NOT DETECTED NOT DETECTED Final   Streptococcus agalactiae NOT DETECTED NOT DETECTED Final   Streptococcus pneumoniae NOT DETECTED NOT DETECTED Final   Streptococcus pyogenes NOT DETECTED NOT DETECTED Final   Acinetobacter baumannii NOT DETECTED NOT DETECTED Final   Enterobacteriaceae species NOT DETECTED NOT DETECTED Final   Enterobacter cloacae complex NOT DETECTED NOT DETECTED Final   Escherichia coli NOT DETECTED NOT DETECTED Final   Klebsiella oxytoca NOT DETECTED NOT DETECTED Final   Klebsiella pneumoniae NOT DETECTED NOT DETECTED Final   Proteus species NOT DETECTED NOT DETECTED Final   Serratia marcescens NOT DETECTED NOT DETECTED Final   Haemophilus influenzae NOT DETECTED NOT DETECTED Final   Neisseria meningitidis NOT DETECTED NOT DETECTED Final   Pseudomonas aeruginosa NOT DETECTED NOT DETECTED Final   Candida albicans NOT DETECTED NOT DETECTED Final   Candida glabrata NOT DETECTED NOT DETECTED Final   Candida krusei NOT DETECTED NOT DETECTED Final   Candida parapsilosis NOT DETECTED NOT DETECTED Final   Candida tropicalis NOT DETECTED NOT DETECTED Final  Blood culture (routine x 2)     Status: None (Preliminary result)   Collection Time: 12/28/15  9:20 PM  Result Value Ref Range Status   Specimen Description BLOOD RIGHT WRIST  Final   Special Requests IN PEDIATRIC BOTTLE 3CC  Final   Culture NO GROWTH < 24 HOURS  Final   Report Status PENDING  Incomplete  MRSA PCR Screening     Status: None    Collection Time: 12/29/15  1:14 AM  Result Value Ref Range Status   MRSA by PCR NEGATIVE NEGATIVE Final    Comment:        The GeneXpert MRSA Assay (FDA approved for NASAL specimens only), is one component of a comprehensive MRSA colonization surveillance program. It is not intended to diagnose MRSA infection nor to guide or monitor treatment for MRSA infections.      Labs: Basic Metabolic Panel:  Recent Labs Lab 12/28/15 1946 12/29/15 0249 12/30/15 0224  NA 138 138 136  K 3.6 4.1 5.5*  CL 92* 98* 97*  CO2 35* 32 31  GLUCOSE 125* 193* 288*  BUN 17 18 20   CREATININE 0.56 0.56 0.51  CALCIUM 8.9 8.2* 8.2*   Liver Function Tests:  Recent Labs Lab 12/28/15 1946  AST 32  ALT 22  ALKPHOS 99  BILITOT 0.1*  PROT 6.7  ALBUMIN 2.8*   CBC:  Recent Labs Lab 12/28/15 1946 12/29/15 0249 12/30/15 0224  WBC 16.2* 10.2 12.2*  NEUTROABS 12.9*  --   --   HGB 11.7* 10.4* 9.6*  HCT 38.3 34.7* 31.4*  MCV 90.5 90.6 90.2  PLT 479* 384 405*   Cardiac Enzymes:  Recent Labs Lab 12/29/15 0649 12/30/15 0224  TROPONINI 0.12* 0.42*   BNP: BNP (last 3 results)  Recent Labs  06/10/15 0504 12/28/15 1946  BNP 29.3 211.4*    CBG:  Recent Labs Lab 12/28/15 1928  GLUCAP 123*     IMAGING STUDIES Dg Chest Portable 1 View  Result Date: 12/28/2015 CLINICAL DATA:  Patient presents with EMS from home reports worsening SOB with dry cough/wheezing for several days . EXAM: PORTABLE CHEST 1 VIEW COMPARISON:  11/16/2015 FINDINGS: Normal cardiac silhouette. New RIGHT lower lobe pneumonia. Lungs are hyperinflated. IMPRESSION: RIGHT lower lobe pneumonia. Electronically Signed   By: Suzy Bouchard M.D.   On: 12/28/2015 19:50    DISCHARGE EXAMINATION: Vitals:   12/30/15 0348 12/30/15 0351 12/30/15 0806 12/30/15 0810  BP: 140/74     Pulse: (!) 107     Resp:      Temp: 98.2 F (36.8 C)     TempSrc: Oral     SpO2: 100%  98% 98%  Weight:  51.7 kg (113 lb 15.7 oz)      Height:       General appearance: not very responsive. But arousable. Resp: very diminished air entry Cardio: regular rate and rhythm, S1, S2 normal, no murmur, click, rub or gallop GI: soft, non-tender; bowel sounds normal; no masses,  no organomegaly Extremities: extremities normal, atraumatic, no cyanosis or edema  DISPOSITION: Residential Hospice    ALLERGIES:  Allergies  Allergen Reactions  . Doxycycline Anaphylaxis  . Codeine Other (See Comments)    hyperactivity  . Dulera [Mometasone Furo-Formoterol Fum] Hives and Rash  . Adhesive [Tape] Rash  . Latex Rash  . Lisinopril Cough  . Oxycodone Other (See Comments)    Zonked out on 10 mg,  Pt can tolerate 5 mg    Current Inpatient Medications:  Scheduled: . aspirin  325 mg Oral Daily  . budesonide (PULMICORT) nebulizer solution  0.25 mg Nebulization BID  . [START ON 01/23/2016] cyanocobalamin  1,000 mcg Intramuscular Q30 days  . diltiazem  180 mg Oral Daily  . enoxaparin (LOVENOX) injection  40 mg Subcutaneous Q24H  . FLUoxetine  10 mg Oral Daily  . guaiFENesin  600 mg Oral BID  . insulin glargine  25 Units Subcutaneous Daily  . ipratropium-albuterol  3 mL Nebulization QID  . mouth rinse  15 mL Mouth Rinse BID  . methylPREDNISolone (SOLU-MEDROL) injection  80 mg Intravenous Q6H  . sodium chloride flush  3 mL Intravenous Q12H   Continuous:  [id number] **OR** acetaminophen, gabapentin, hydrALAZINE, ipratropium-albuterol, LORazepam, morphine injection, nitroGLYCERIN, ondansetron **OR** ondansetron (ZOFRAN) IV, oxyCODONE    TOTAL DISCHARGE TIME: 35 mins  [facility] Pager [phone]  12/30/2015, 11:22 AM

## 2015-12-31 LAB — CULTURE, BLOOD (ROUTINE X 2)

## 2016-01-01 NOTE — Telephone Encounter (Signed)
Noted.  Records reviewed.

## 2016-01-02 LAB — CULTURE, BLOOD (ROUTINE X 2): CULTURE: NO GROWTH

## 2016-01-06 DEATH — deceased

## 2016-07-27 IMAGING — CR DG CHEST 2V
2 series · 2 of 2 positions shown · non-contrast
Comparison: June 18, 2014

CLINICAL DATA: Shortness of breath dry cough for 1 week

EXAM:
CHEST  2 VIEW

[chest pa]
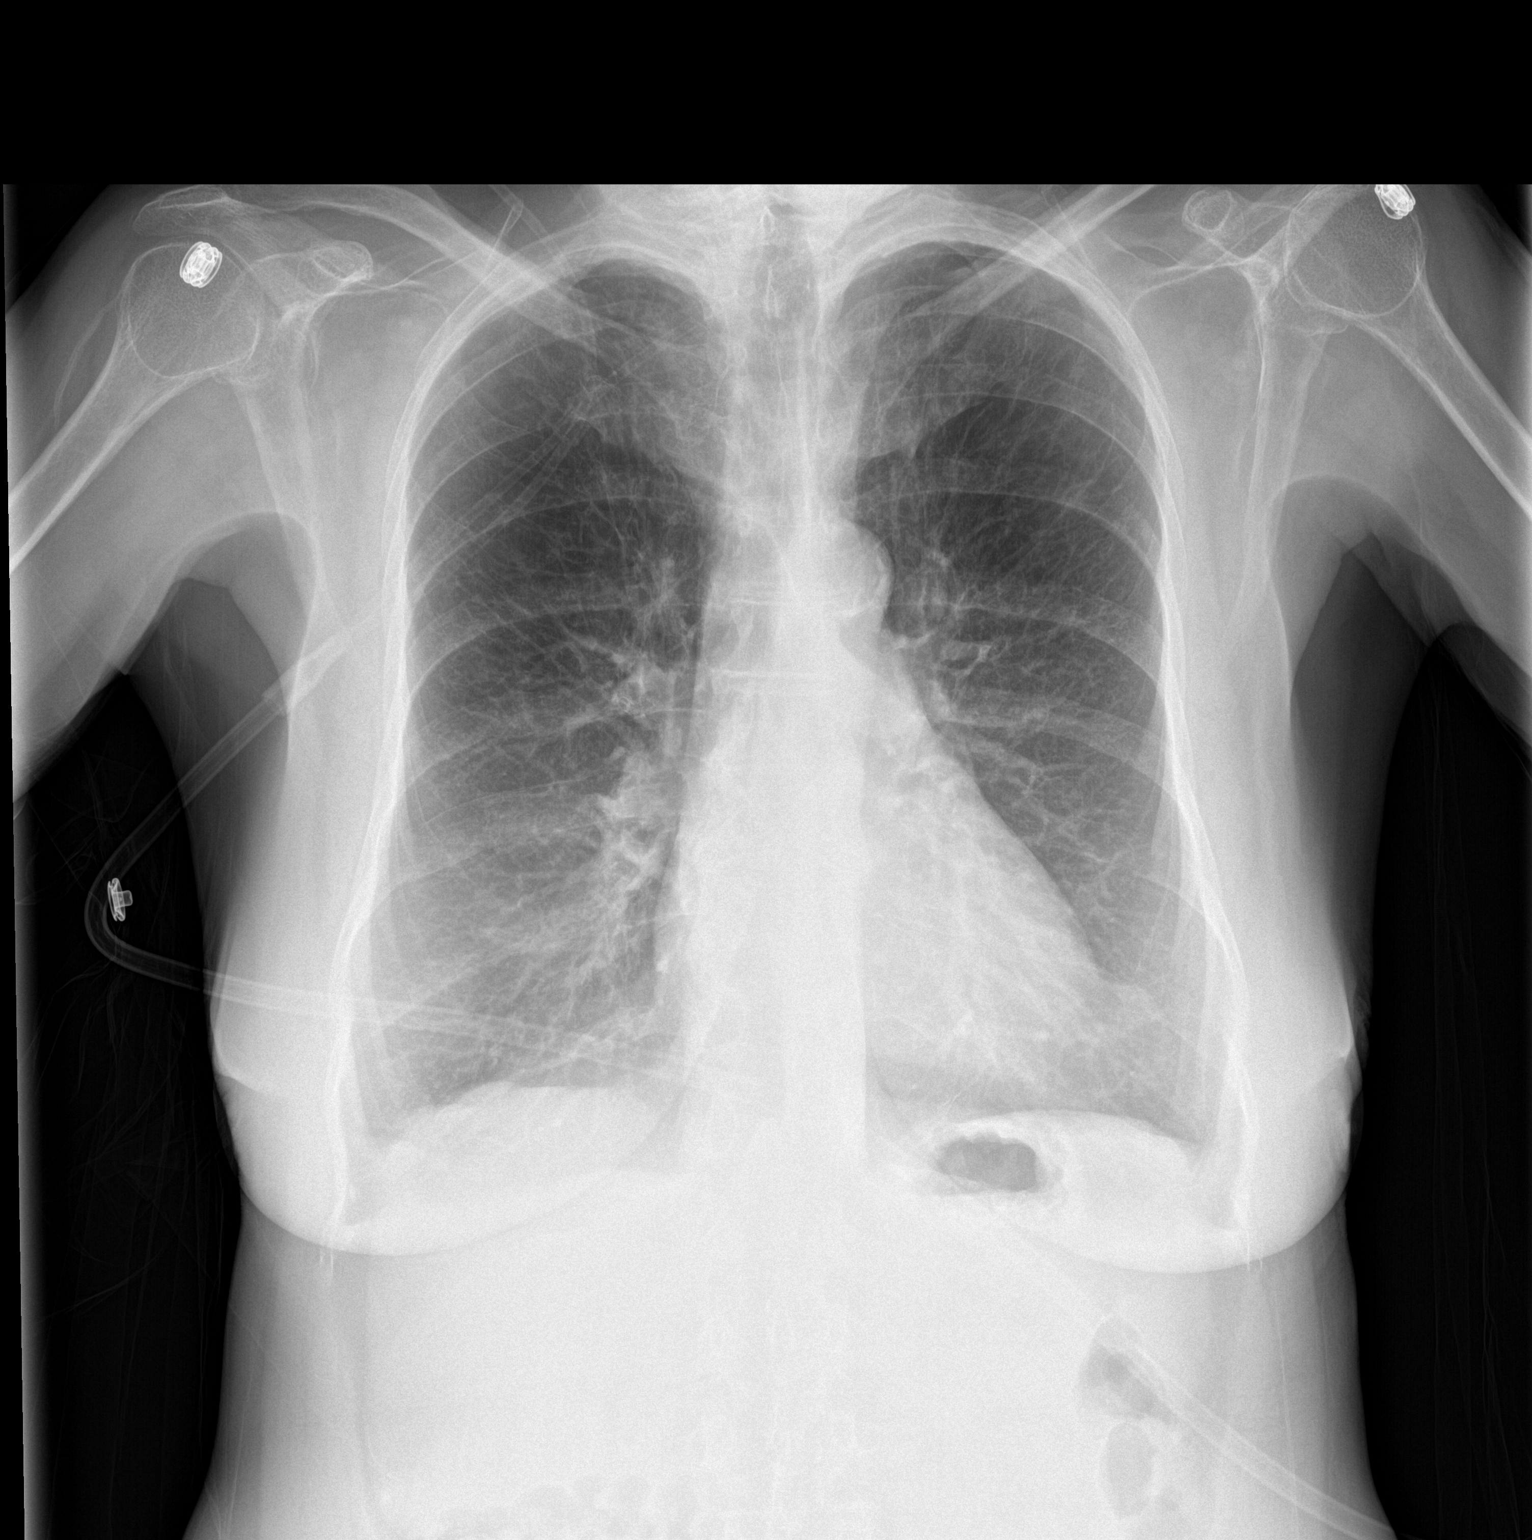

[chest lat]
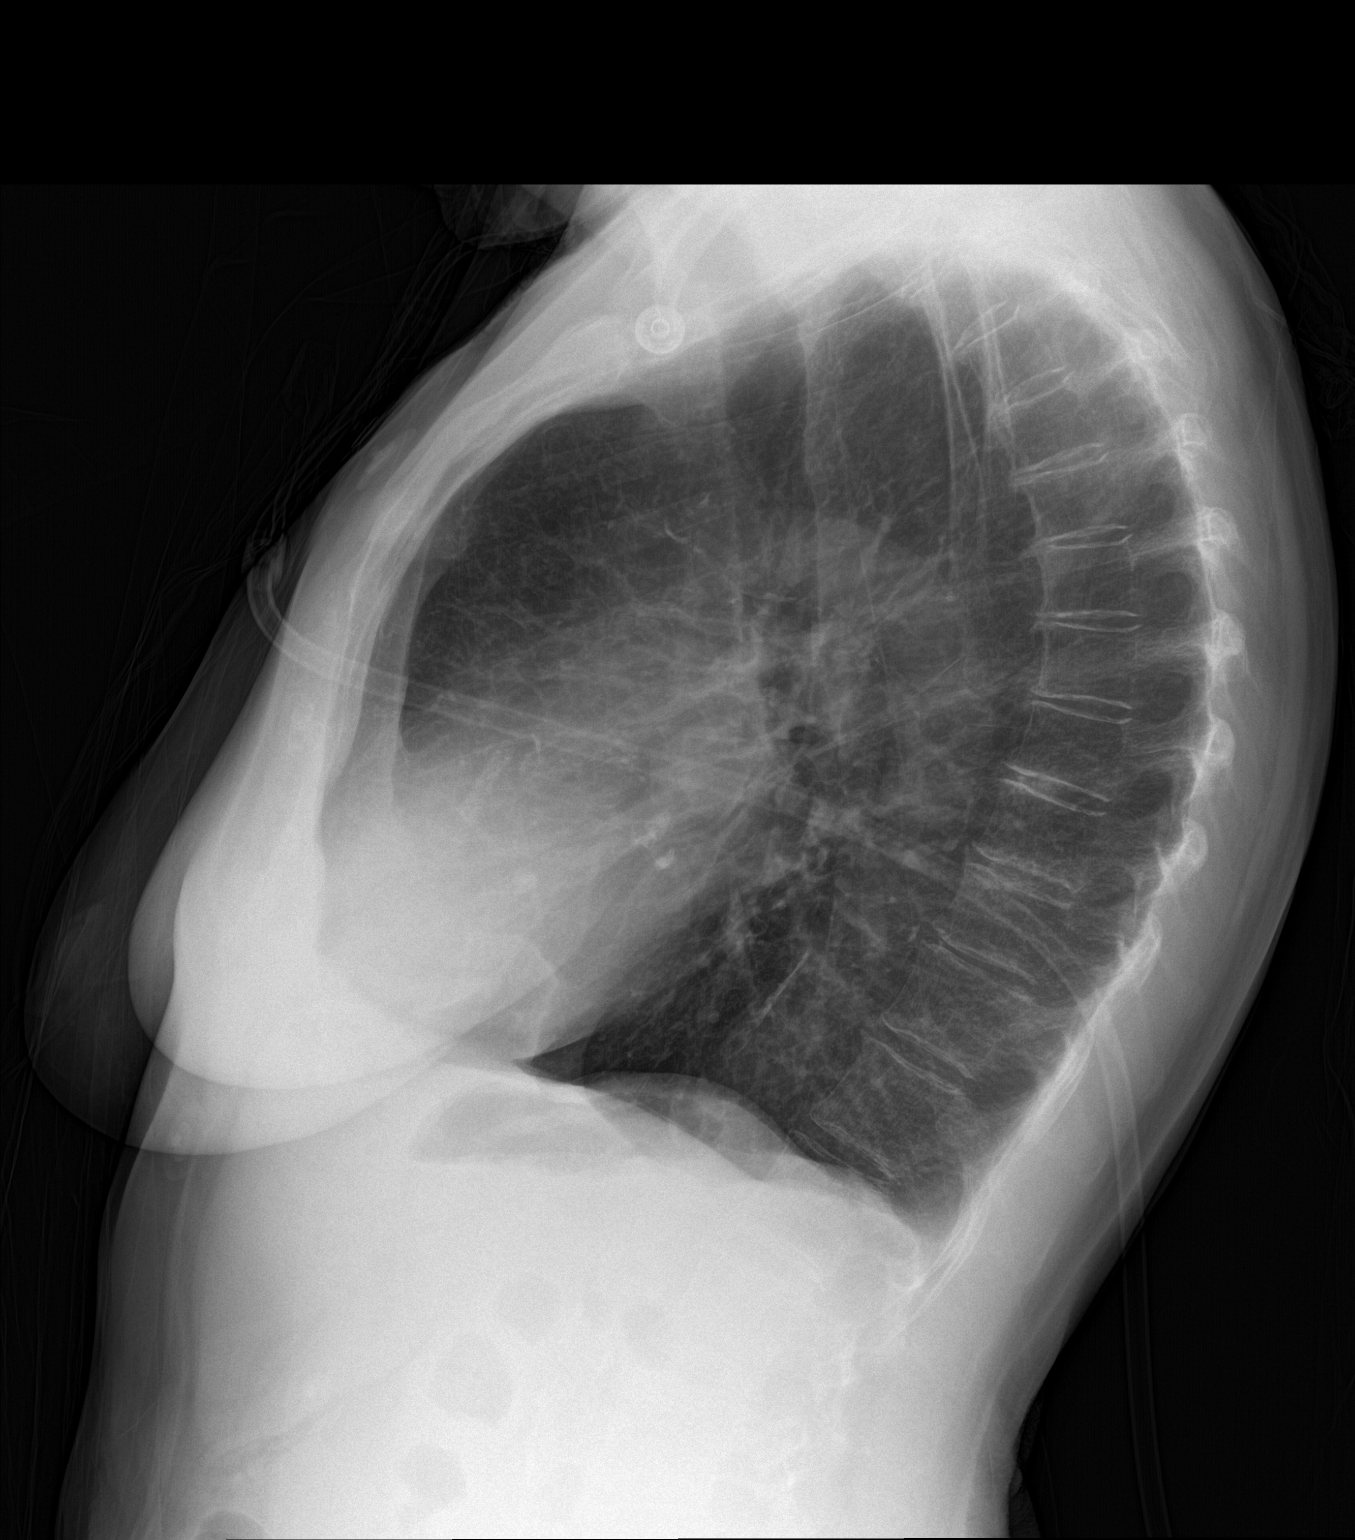

[2 of 2 positions shown; findings below may reference images not displayed]

FINDINGS: Lungs are mildly hyperexpanded. There is scarring in the left base.
There is no edema or consolidation. The heart size and pulmonary
vascularity within normal limits. No adenopathy. No bone lesions.
IMPRESSION: Scarring left base. Lungs mildly hyperexpanded. No edema or
consolidation.

## 2017-01-13 IMAGING — CR DG CHEST 1V PORT
1 series · 1 of 1 positions shown · non-contrast
Comparison: 12/25/2014

CLINICAL DATA: Worsening shortness of Breath for couple of days,
history of COPD

EXAM:
PORTABLE CHEST 1 VIEW

[AP]
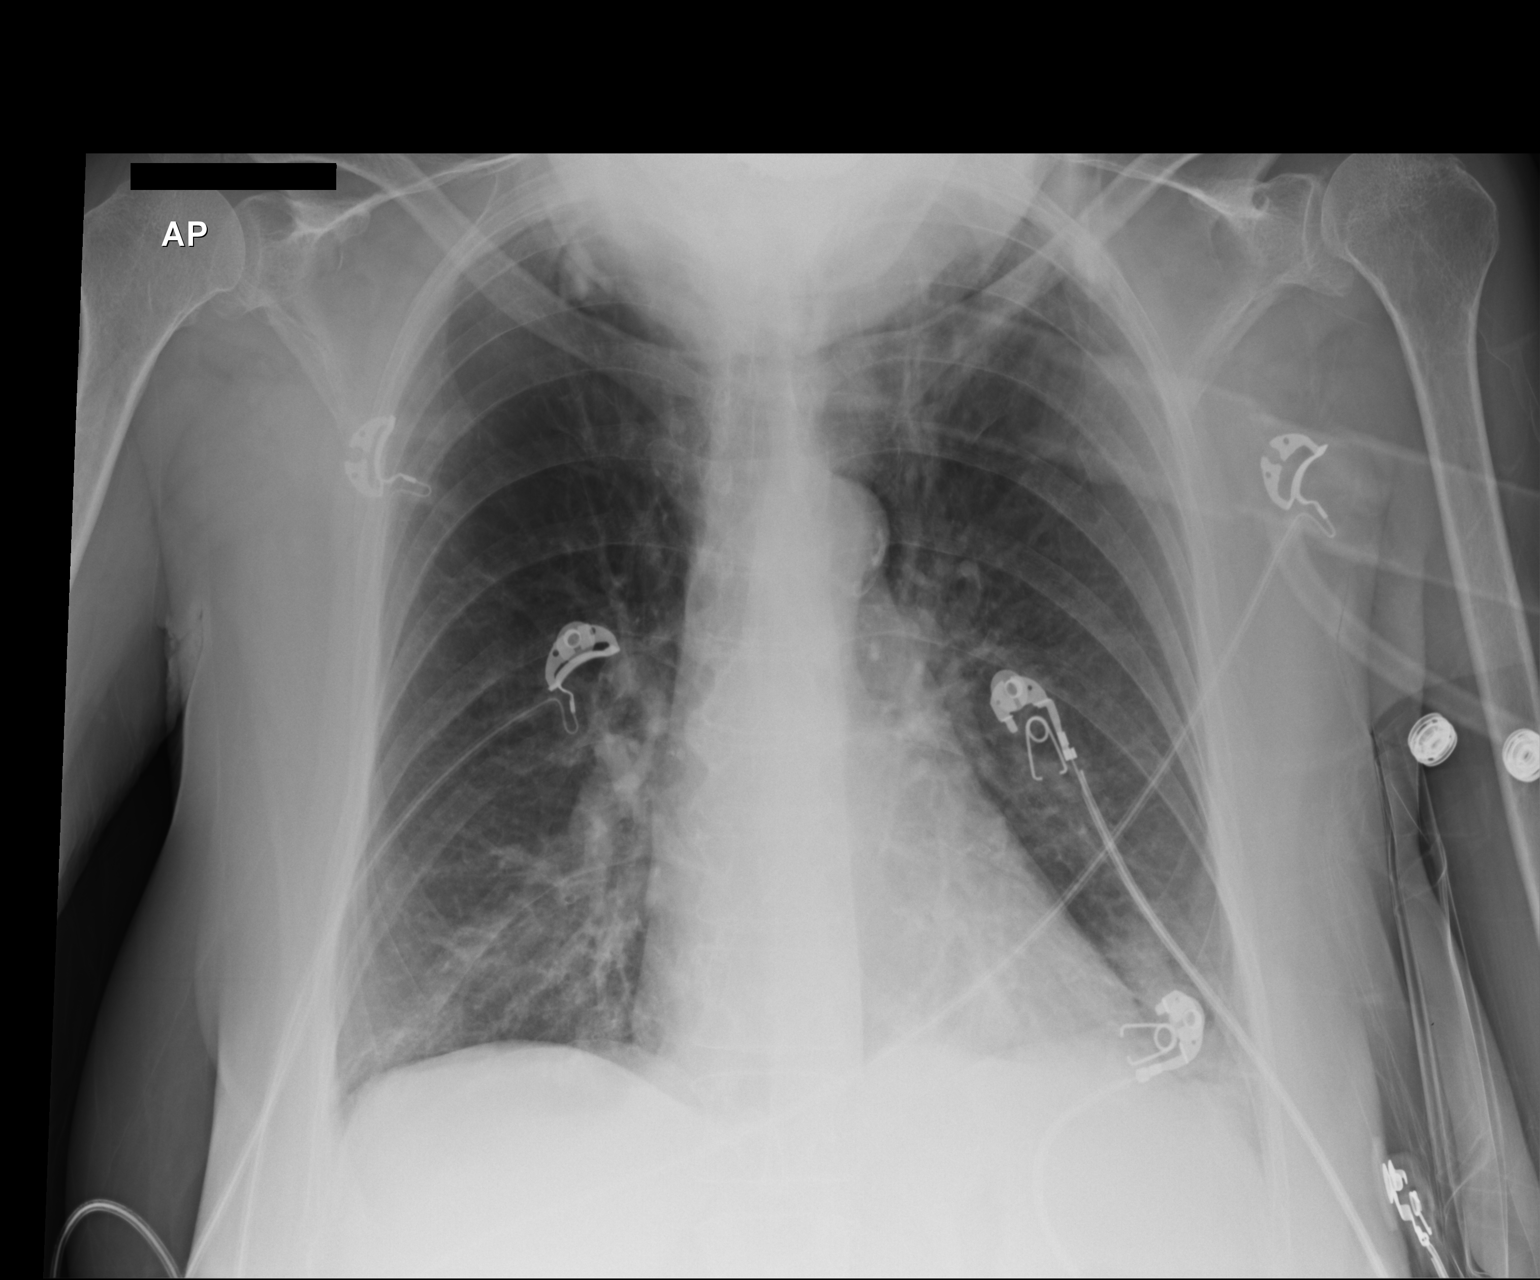

[1 of 1 positions shown; findings below may reference images not displayed]

FINDINGS: Cardiomediastinal silhouette is stable. Mild hyperinflation. There
is patchy left basilar atelectasis or early infiltrate. No pulmonary
edema. Atherosclerotic calcifications of thoracic aorta again noted.
IMPRESSION: Mild hyperinflation again noted. Patchy left basilar atelectasis or
early infiltrate. No pulmonary edema.

## 2017-03-03 IMAGING — DX DG CHEST 2V
2 series · 2 of 2 positions shown · non-contrast
Comparison: 02/01/2015 and prior exams

CLINICAL DATA: Acute shortness of breath.

EXAM:
CHEST  2 VIEW

[chest lat]
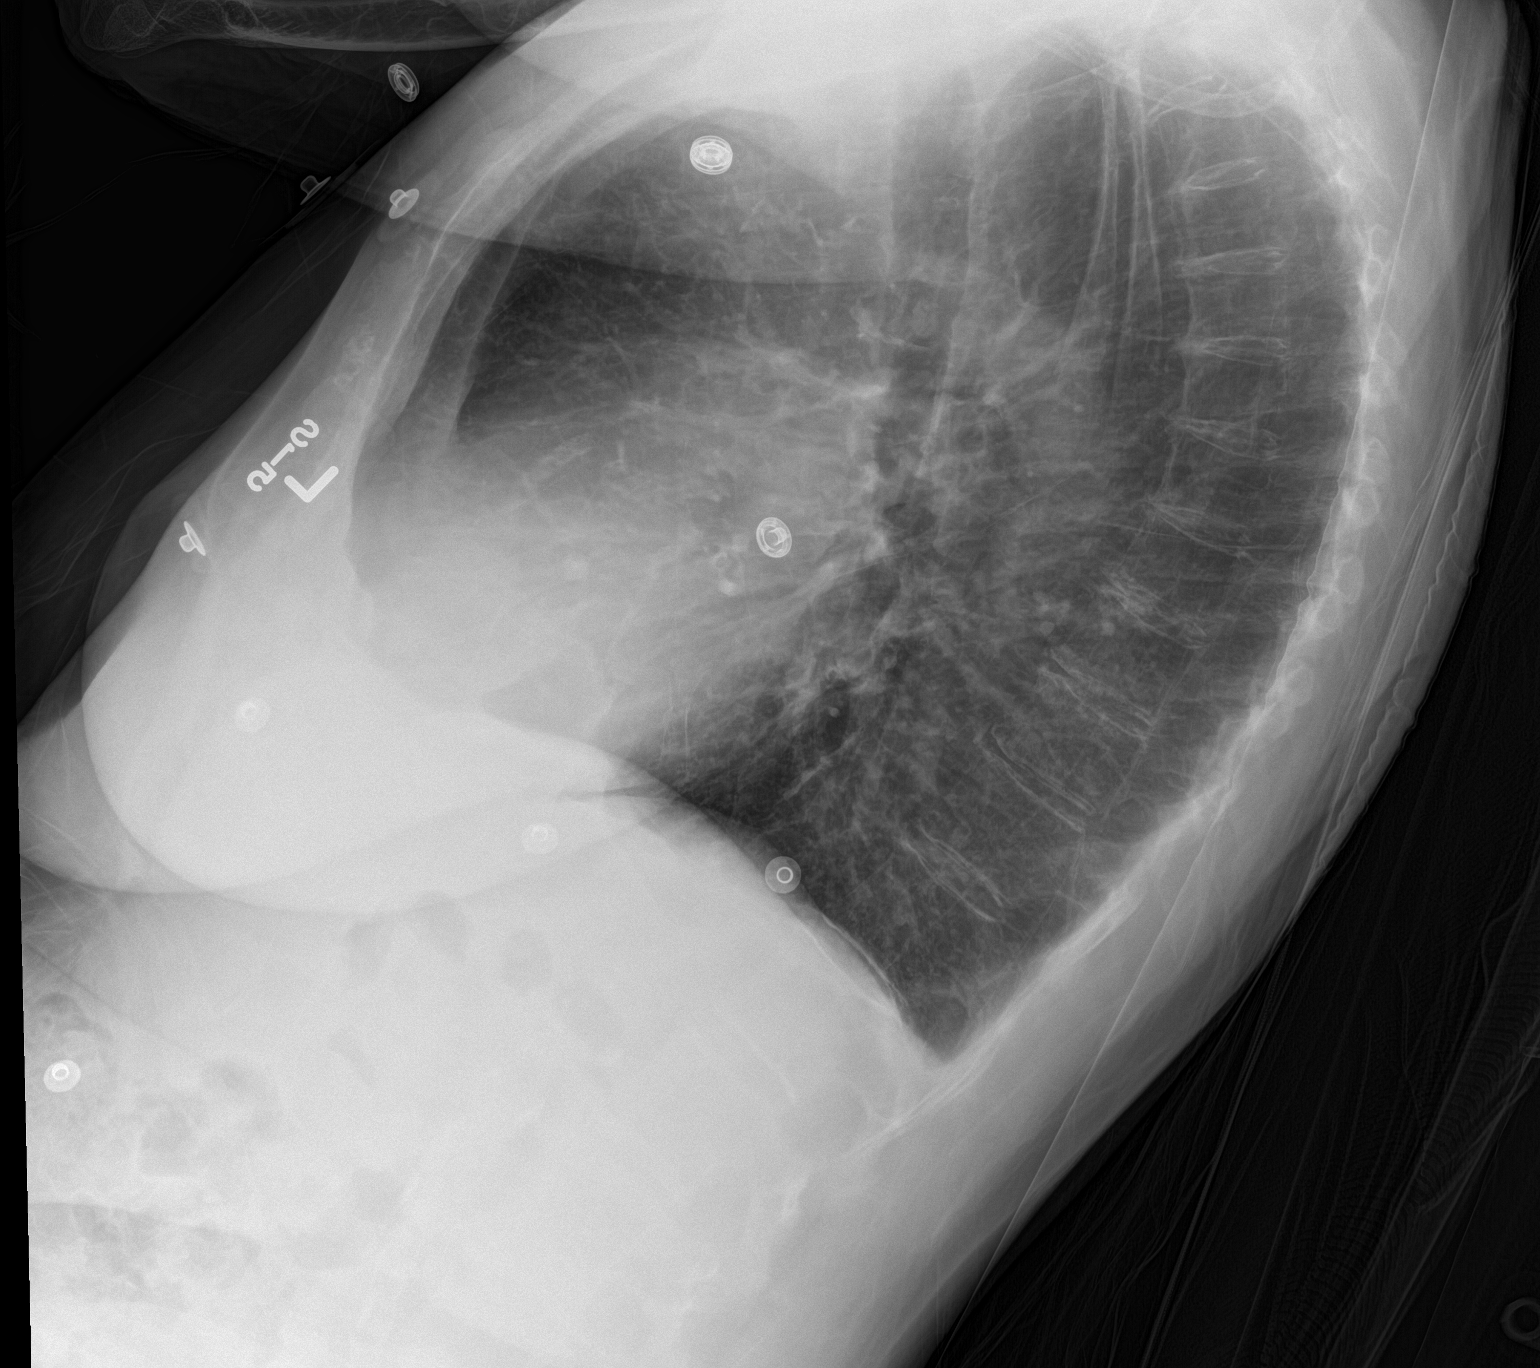

[chest ap]
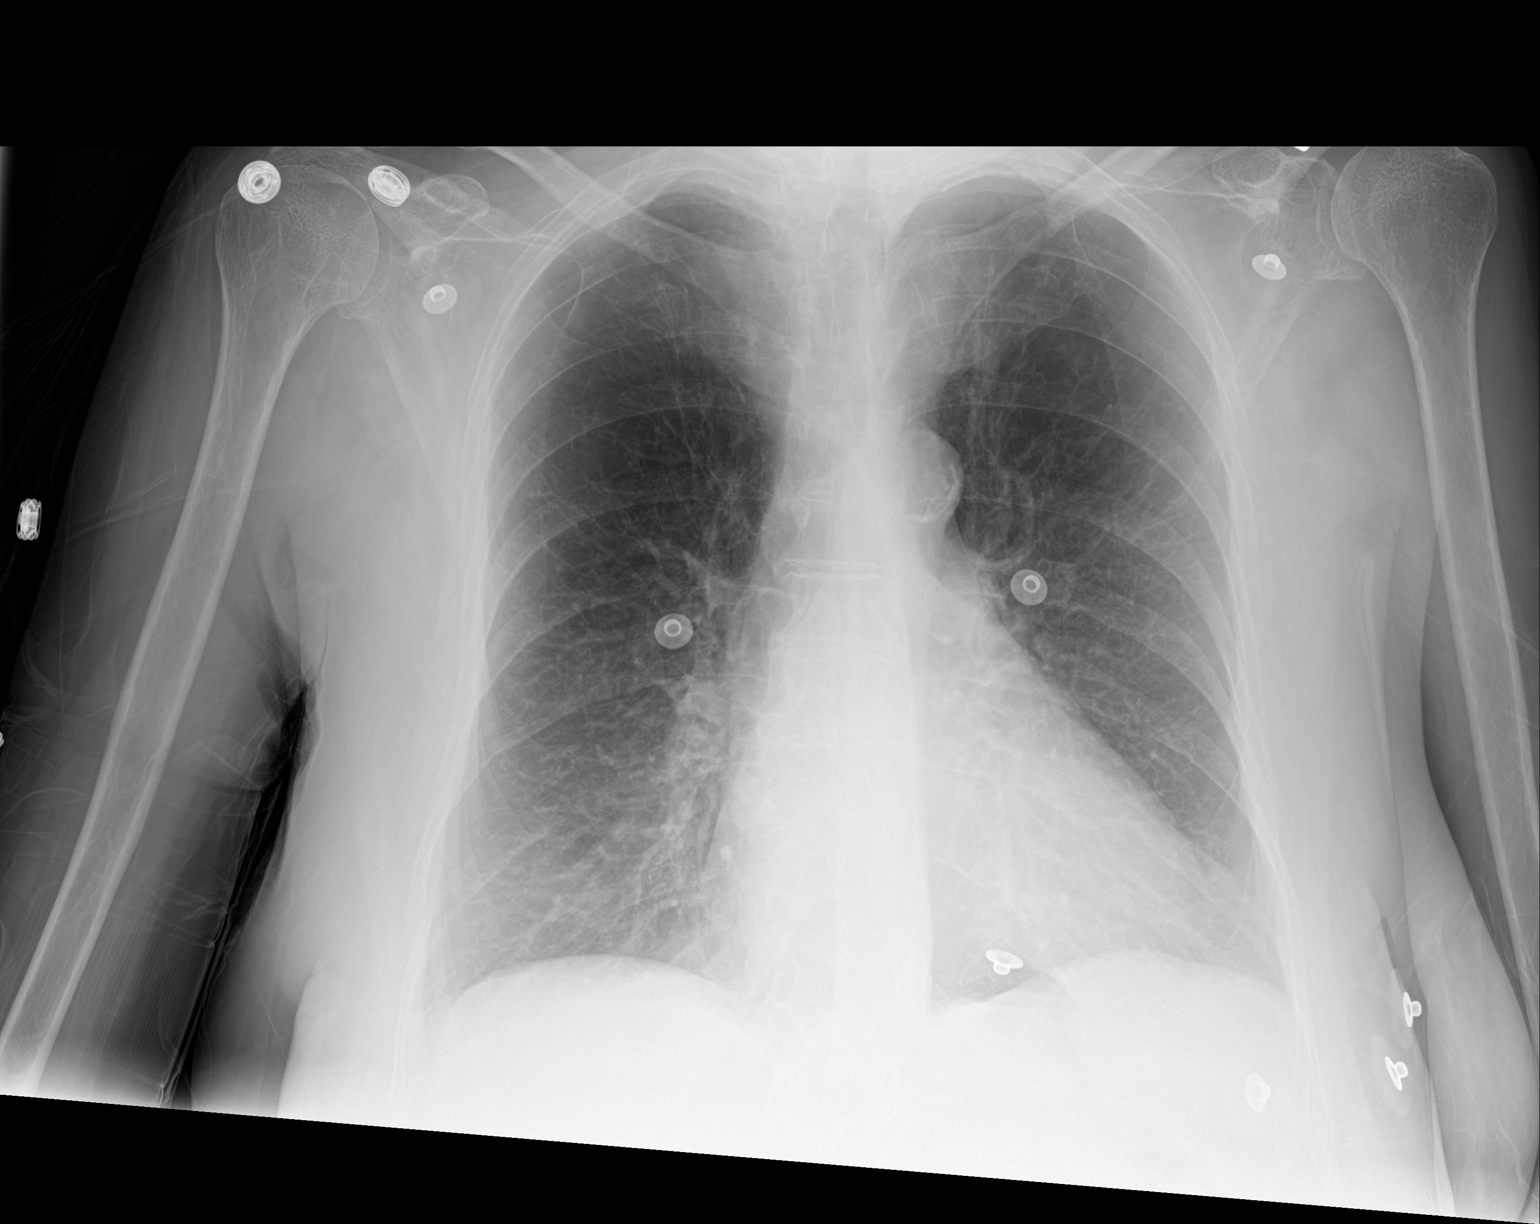

[2 of 2 positions shown; findings below may reference images not displayed]

FINDINGS: Cardiomegaly and COPD/ emphysema again identified.

There is no evidence of focal airspace disease, pulmonary edema,
suspicious pulmonary nodule/mass, pleural effusion, or pneumothorax.
No acute bony abnormalities are identified.
IMPRESSION: No evidence of acute cardiopulmonary disease.

Cardiomegaly and COPD/emphysema.
